# Patient Record
Sex: Female | Born: 1965 | Race: Black or African American | Hispanic: No | Marital: Married | Smoking: Former smoker
Health system: Southern US, Community
[De-identification: ages and names within clinical notes are randomized; demographics above are authoritative.]

## PROBLEM LIST (undated history)

## (undated) DIAGNOSIS — C801 Malignant (primary) neoplasm, unspecified: Secondary | ICD-10-CM

## (undated) DIAGNOSIS — M1990 Unspecified osteoarthritis, unspecified site: Secondary | ICD-10-CM

## (undated) HISTORY — PX: TONSILLECTOMY: SUR1361

## (undated) HISTORY — PX: CHOLECYSTECTOMY: SHX55

---

## 2009-03-02 ENCOUNTER — Ambulatory Visit: Payer: Self-pay | Admitting: Diagnostic Radiology

## 2009-03-02 ENCOUNTER — Emergency Department (HOSPITAL_BASED_OUTPATIENT_CLINIC_OR_DEPARTMENT_OTHER): Admission: EM | Admit: 2009-03-02 | Discharge: 2009-03-02 | Payer: Self-pay | Admitting: Emergency Medicine

## 2012-08-20 ENCOUNTER — Encounter (HOSPITAL_BASED_OUTPATIENT_CLINIC_OR_DEPARTMENT_OTHER): Payer: Self-pay | Admitting: *Deleted

## 2012-08-20 ENCOUNTER — Emergency Department (HOSPITAL_BASED_OUTPATIENT_CLINIC_OR_DEPARTMENT_OTHER)
Admission: EM | Admit: 2012-08-20 | Discharge: 2012-08-20 | Disposition: A | Discharge status: Home / Self Care | Payer: Self-pay | Attending: Emergency Medicine | Admitting: Emergency Medicine

## 2012-08-20 DIAGNOSIS — J3489 Other specified disorders of nose and nasal sinuses: Secondary | ICD-10-CM | POA: Insufficient documentation

## 2012-08-20 DIAGNOSIS — R05 Cough: Secondary | ICD-10-CM | POA: Insufficient documentation

## 2012-08-20 DIAGNOSIS — N390 Urinary tract infection, site not specified: Secondary | ICD-10-CM

## 2012-08-20 DIAGNOSIS — H9209 Otalgia, unspecified ear: Secondary | ICD-10-CM | POA: Insufficient documentation

## 2012-08-20 DIAGNOSIS — J069 Acute upper respiratory infection, unspecified: Secondary | ICD-10-CM | POA: Insufficient documentation

## 2012-08-20 DIAGNOSIS — Z3202 Encounter for pregnancy test, result negative: Secondary | ICD-10-CM | POA: Insufficient documentation

## 2012-08-20 DIAGNOSIS — IMO0001 Reserved for inherently not codable concepts without codable children: Secondary | ICD-10-CM | POA: Insufficient documentation

## 2012-08-20 DIAGNOSIS — R059 Cough, unspecified: Secondary | ICD-10-CM

## 2012-08-20 LAB — URINALYSIS, ROUTINE W REFLEX MICROSCOPIC
Bilirubin Urine: NEGATIVE
Nitrite: POSITIVE — AB
Specific Gravity, Urine: 1.018 (ref 1.005–1.030)
Urobilinogen, UA: 1 mg/dL (ref 0.0–1.0)

## 2012-08-20 LAB — PREGNANCY, URINE: Preg Test, Ur: NEGATIVE

## 2012-08-20 LAB — URINE MICROSCOPIC-ADD ON

## 2012-08-20 MED ORDER — HYDROCOD POLST-CHLORPHEN POLST 10-8 MG/5ML PO LQCR: 5.0000 mL | Freq: Every evening | ORAL | Status: DC | PRN

## 2012-08-20 MED ORDER — SULFAMETHOXAZOLE-TRIMETHOPRIM 800-160 MG PO TABS: 1.0000 | ORAL_TABLET | Freq: Two times a day (BID) | ORAL | Status: DC

## 2012-08-20 NOTE — ED Provider Notes (Signed)
Medical screening examination/treatment/procedure(s) were performed by non-physician practitioner and as supervising physician I was immediately available for consultation/collaboration.   Rayma Hegg, MD 08/20/12 1442 

## 2012-08-20 NOTE — ED Notes (Signed)
NP at bedside.

## 2012-08-20 NOTE — ED Notes (Signed)
Headache, sore throat, cough and body aches x 2 days.

## 2012-08-20 NOTE — ED Provider Notes (Signed)
History     CSN: [id number]  Arrival date & time 08/20/12  1339   First MD Initiated Contact with Patient 08/20/12 1349      Chief Complaint  Patient presents with  . URI    (Consider location/radiation/quality/duration/timing/severity/associated sxs/prior treatment) Patient is a 47 y.o. female presenting with URI. The history is provided by the patient. No language interpreter was used.  URI Presenting symptoms: congestion, cough and ear pain   Presenting symptoms: no fever   Severity:  Mild Onset quality:  Gradual Duration:  2 days Timing:  Constant Progression:  Unchanged Relieved by:  Nothing Worsened by:  Nothing tried Ineffective treatments:  None tried Associated symptoms: myalgias   Associated symptoms: no neck pain and no sinus pain   Risk factors: sick contacts     History reviewed. No pertinent past medical history.  Past Surgical History  Procedure Laterality Date  . Cholecystectomy    . Tonsillectomy      No family history on file.  History  Substance Use Topics  . Smoking status: Never Smoker   . Smokeless tobacco: Not on file  . Alcohol Use: Yes    OB History   Grav Para Term Preterm Abortions TAB SAB Ect Mult Living                  Review of Systems  Constitutional: Negative for fever.  HENT: Positive for ear pain and congestion. Negative for neck pain.   Respiratory: Positive for cough.   Musculoskeletal: Positive for myalgias.    Allergies  Review of patient's allergies indicates no known allergies.  Home Medications  No current outpatient prescriptions on file.  BP 115/73  Pulse 95  Temp(Src) 99.1 F (37.3 C) (Oral)  Resp 22  Wt 275 lb (124.739 kg)  SpO2 97%  LMP 04/20/2012  Physical Exam  Nursing note and vitals reviewed. Constitutional: She is oriented to person, place, and time. She appears well-developed and well-nourished.  HENT:  Head: Normocephalic and atraumatic.  Right Ear: External ear normal.  Left Ear:  External ear normal.  Nose: Rhinorrhea present.  Mouth/Throat: Posterior oropharyngeal erythema present.  Eyes: Conjunctivae and EOM are normal.  Neck: Normal range of motion.  Cardiovascular: Normal rate and regular rhythm.   Pulmonary/Chest: Effort normal and breath sounds normal.  Musculoskeletal: Normal range of motion.  Neurological: She is alert and oriented to person, place, and time.  Skin: Skin is warm and dry.  Psychiatric: She has a normal mood and affect.    ED Course  Procedures (including critical care time)  Labs Reviewed  URINALYSIS, ROUTINE W REFLEX MICROSCOPIC - Abnormal; Notable for the following:    APPearance CLOUDY (*)    Hgb urine dipstick TRACE (*)    Ketones, ur 15 (*)    Nitrite POSITIVE (*)    Leukocytes, UA MODERATE (*)    All other components within normal limits  URINE MICROSCOPIC-ADD ON - Abnormal; Notable for the following:    Bacteria, UA MANY (*)    All other components within normal limits  URINE CULTURE  PREGNANCY, URINE   No results found.   1. UTI (lower urinary tract infection)   2. Cough       MDM  uti treated:will the uri symptoms with cough syrup:urine sent for culture        Teressa Lower, NP 08/20/12 1425

## 2012-08-22 LAB — URINE CULTURE: Colony Count: >=100000

## 2012-08-23 NOTE — ED Notes (Signed)
+   Urine Patient treated with Septra-sensitive to same-chart appended per protocol MD. 

## 2013-08-15 ENCOUNTER — Encounter (HOSPITAL_BASED_OUTPATIENT_CLINIC_OR_DEPARTMENT_OTHER): Payer: Self-pay | Admitting: Emergency Medicine

## 2013-08-15 ENCOUNTER — Emergency Department (HOSPITAL_BASED_OUTPATIENT_CLINIC_OR_DEPARTMENT_OTHER): Payer: No Typology Code available for payment source

## 2013-08-15 ENCOUNTER — Emergency Department (HOSPITAL_BASED_OUTPATIENT_CLINIC_OR_DEPARTMENT_OTHER)
Admission: EM | Admit: 2013-08-15 | Discharge: 2013-08-15 | Disposition: A | Discharge status: Home / Self Care | Payer: No Typology Code available for payment source | Attending: Emergency Medicine | Admitting: Emergency Medicine

## 2013-08-15 DIAGNOSIS — S139XXA Sprain of joints and ligaments of unspecified parts of neck, initial encounter: Secondary | ICD-10-CM | POA: Insufficient documentation

## 2013-08-15 DIAGNOSIS — Y92410 Unspecified street and highway as the place of occurrence of the external cause: Secondary | ICD-10-CM | POA: Insufficient documentation

## 2013-08-15 DIAGNOSIS — S161XXA Strain of muscle, fascia and tendon at neck level, initial encounter: Secondary | ICD-10-CM

## 2013-08-15 DIAGNOSIS — S0990XA Unspecified injury of head, initial encounter: Secondary | ICD-10-CM | POA: Insufficient documentation

## 2013-08-15 DIAGNOSIS — S298XXA Other specified injuries of thorax, initial encounter: Secondary | ICD-10-CM | POA: Insufficient documentation

## 2013-08-15 DIAGNOSIS — R0789 Other chest pain: Secondary | ICD-10-CM

## 2013-08-15 DIAGNOSIS — S46909A Unspecified injury of unspecified muscle, fascia and tendon at shoulder and upper arm level, unspecified arm, initial encounter: Secondary | ICD-10-CM | POA: Insufficient documentation

## 2013-08-15 DIAGNOSIS — S4980XA Other specified injuries of shoulder and upper arm, unspecified arm, initial encounter: Secondary | ICD-10-CM | POA: Insufficient documentation

## 2013-08-15 DIAGNOSIS — Y9389 Activity, other specified: Secondary | ICD-10-CM | POA: Insufficient documentation

## 2013-08-15 DIAGNOSIS — IMO0002 Reserved for concepts with insufficient information to code with codable children: Secondary | ICD-10-CM | POA: Insufficient documentation

## 2013-08-15 DIAGNOSIS — Z792 Long term (current) use of antibiotics: Secondary | ICD-10-CM | POA: Insufficient documentation

## 2013-08-15 DIAGNOSIS — Z87891 Personal history of nicotine dependence: Secondary | ICD-10-CM | POA: Insufficient documentation

## 2013-08-15 MED ORDER — HYDROCODONE-ACETAMINOPHEN 5-325 MG PO TABS: 1.0000 | ORAL_TABLET | Freq: Four times a day (QID) | ORAL | Status: DC | PRN

## 2013-08-15 MED ORDER — CYCLOBENZAPRINE HCL 10 MG PO TABS: 10.0000 mg | ORAL_TABLET | Freq: Two times a day (BID) | ORAL | Status: DC | PRN
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
## 2013-08-15 NOTE — ED Notes (Signed)
MD at bedside. 

## 2020-10-10 ENCOUNTER — Inpatient Hospital Stay (HOSPITAL_BASED_OUTPATIENT_CLINIC_OR_DEPARTMENT_OTHER)
Admission: EM | Admit: 2020-10-10 | Discharge: 2020-10-18 | DRG: 330 | Disposition: A | Discharge status: Home / Self Care | Payer: 59 | Attending: Internal Medicine | Admitting: Internal Medicine

## 2020-10-10 ENCOUNTER — Encounter (HOSPITAL_BASED_OUTPATIENT_CLINIC_OR_DEPARTMENT_OTHER): Payer: Self-pay

## 2020-10-10 ENCOUNTER — Other Ambulatory Visit: Payer: Self-pay

## 2020-10-10 ENCOUNTER — Emergency Department (HOSPITAL_BASED_OUTPATIENT_CLINIC_OR_DEPARTMENT_OTHER): Payer: 59

## 2020-10-10 DIAGNOSIS — C787 Secondary malignant neoplasm of liver and intrahepatic bile duct: Secondary | ICD-10-CM | POA: Diagnosis present

## 2020-10-10 DIAGNOSIS — C180 Malignant neoplasm of cecum: Secondary | ICD-10-CM | POA: Diagnosis present

## 2020-10-10 DIAGNOSIS — Z6841 Body Mass Index (BMI) 40.0 and over, adult: Secondary | ICD-10-CM | POA: Diagnosis not present

## 2020-10-10 DIAGNOSIS — Z9049 Acquired absence of other specified parts of digestive tract: Secondary | ICD-10-CM

## 2020-10-10 DIAGNOSIS — Z90710 Acquired absence of both cervix and uterus: Secondary | ICD-10-CM | POA: Diagnosis not present

## 2020-10-10 DIAGNOSIS — N390 Urinary tract infection, site not specified: Secondary | ICD-10-CM | POA: Diagnosis present

## 2020-10-10 DIAGNOSIS — D123 Benign neoplasm of transverse colon: Secondary | ICD-10-CM | POA: Diagnosis not present

## 2020-10-10 DIAGNOSIS — E860 Dehydration: Secondary | ICD-10-CM | POA: Diagnosis present

## 2020-10-10 DIAGNOSIS — Z87891 Personal history of nicotine dependence: Secondary | ICD-10-CM | POA: Diagnosis not present

## 2020-10-10 DIAGNOSIS — R160 Hepatomegaly, not elsewhere classified: Secondary | ICD-10-CM | POA: Diagnosis not present

## 2020-10-10 DIAGNOSIS — E871 Hypo-osmolality and hyponatremia: Secondary | ICD-10-CM | POA: Diagnosis present

## 2020-10-10 DIAGNOSIS — K56609 Unspecified intestinal obstruction, unspecified as to partial versus complete obstruction: Secondary | ICD-10-CM

## 2020-10-10 DIAGNOSIS — E876 Hypokalemia: Secondary | ICD-10-CM | POA: Diagnosis present

## 2020-10-10 DIAGNOSIS — R112 Nausea with vomiting, unspecified: Secondary | ICD-10-CM | POA: Diagnosis not present

## 2020-10-10 DIAGNOSIS — Z20822 Contact with and (suspected) exposure to covid-19: Secondary | ICD-10-CM | POA: Diagnosis present

## 2020-10-10 LAB — URINALYSIS, ROUTINE W REFLEX MICROSCOPIC
Bilirubin Urine: NEGATIVE
Glucose, UA: NEGATIVE mg/dL
Ketones, ur: 40 mg/dL — AB
Leukocytes,Ua: NEGATIVE
Nitrite: POSITIVE — AB
Protein, ur: NEGATIVE mg/dL
Specific Gravity, Urine: <1.005 — ABNORMAL LOW (ref 1.005–1.030)
pH: 5 (ref 5.0–8.0)

## 2020-10-10 LAB — CBC WITH DIFFERENTIAL/PLATELET
Abs Immature Granulocytes: 0.01 10*3/uL (ref 0.00–0.07)
Basophils Absolute: 0 10*3/uL (ref 0.0–0.1)
Basophils Relative: 1 %
Eosinophils Absolute: 0.1 10*3/uL (ref 0.0–0.5)
Eosinophils Relative: 3 %
HCT: 44.2 % (ref 36.0–46.0)
Hemoglobin: 14.2 g/dL (ref 12.0–15.0)
Immature Granulocytes: 0 %
Lymphocytes Relative: 42 %
Lymphs Abs: 2.3 10*3/uL (ref 0.7–4.0)
MCH: 30.5 pg (ref 26.0–34.0)
MCHC: 32.1 g/dL (ref 30.0–36.0)
MCV: 95.1 fL (ref 80.0–100.0)
Monocytes Absolute: 0.4 10*3/uL (ref 0.1–1.0)
Monocytes Relative: 8 %
Neutro Abs: 2.5 10*3/uL (ref 1.7–7.7)
Neutrophils Relative %: 46 %
Platelets: 342 10*3/uL (ref 150–400)
RBC: 4.65 MIL/uL (ref 3.87–5.11)
RDW: 15.3 % (ref 11.5–15.5)
WBC: 5.4 10*3/uL (ref 4.0–10.5)
nRBC: 0 % (ref 0.0–0.2)

## 2020-10-10 LAB — COMPREHENSIVE METABOLIC PANEL
ALT: 66 U/L — ABNORMAL HIGH (ref 0–44)
AST: 61 U/L — ABNORMAL HIGH (ref 15–41)
Albumin: 3.5 g/dL (ref 3.5–5.0)
Alkaline Phosphatase: 72 U/L (ref 38–126)
Anion gap: 11 (ref 5–15)
BUN: 12 mg/dL (ref 6–20)
CO2: 25 mmol/L (ref 22–32)
Calcium: 8.9 mg/dL (ref 8.9–10.3)
Chloride: 98 mmol/L (ref 98–111)
Creatinine, Ser: 0.89 mg/dL (ref 0.44–1.00)
GFR, Estimated: >60 mL/min (ref >60)
Glucose, Bld: 95 mg/dL (ref 70–99)
Potassium: 3.6 mmol/L (ref 3.5–5.1)
Sodium: 134 mmol/L — ABNORMAL LOW (ref 135–145)
Total Bilirubin: 0.8 mg/dL (ref 0.3–1.2)
Total Protein: 7.4 g/dL (ref 6.5–8.1)

## 2020-10-10 LAB — URINALYSIS, MICROSCOPIC (REFLEX)

## 2020-10-10 LAB — LIPASE, BLOOD: Lipase: 23 U/L (ref 11–51)

## 2020-10-10 LAB — RESP PANEL BY RT-PCR (FLU A&B, COVID) ARPGX2
Influenza A by PCR: NEGATIVE
Influenza B by PCR: NEGATIVE
SARS Coronavirus 2 by RT PCR: NEGATIVE

## 2020-10-10 MED ORDER — SODIUM CHLORIDE 0.9 % IV BOLUS
1000.0000 mL | Freq: Once | INTRAVENOUS | Status: AC
  Administered 2020-10-10: 1000 mL via INTRAVENOUS

## 2020-10-10 MED ORDER — ONDANSETRON HCL 4 MG/2ML IJ SOLN
4.0000 mg | Freq: Four times a day (QID) | INTRAMUSCULAR | Status: DC | PRN
  Administered 2020-10-15: 4 mg via INTRAVENOUS
  Filled 2020-10-10: qty 2

## 2020-10-10 MED ORDER — ENOXAPARIN SODIUM 60 MG/0.6ML IJ SOSY
60.0000 mg | PREFILLED_SYRINGE | INTRAMUSCULAR | Status: DC
  Administered 2020-10-10 – 2020-10-14 (×4): 60 mg via SUBCUTANEOUS
  Filled 2020-10-10 (×4): qty 0.6

## 2020-10-10 MED ORDER — IOHEXOL 300 MG/ML  SOLN
100.0000 mL | Freq: Once | INTRAMUSCULAR | Status: AC | PRN
  Administered 2020-10-10: 100 mL via INTRAVENOUS

## 2020-10-10 MED ORDER — ONDANSETRON HCL 4 MG PO TABS: 4.0000 mg | ORAL_TABLET | Freq: Four times a day (QID) | ORAL | Status: DC | PRN

## 2020-10-10 MED ORDER — MORPHINE SULFATE (PF) 2 MG/ML IV SOLN
2.0000 mg | INTRAVENOUS | Status: DC | PRN
Start: 2020-10-10 — End: 2020-10-18

## 2020-10-10 MED ORDER — DEXTROSE IN LACTATED RINGERS 5 % IV SOLN: INTRAVENOUS | Status: DC

## 2020-10-10 NOTE — H&P (Signed)
History and Physical   PRABHJOT MADDUX GQQ:[id number] DOB: Aug 06, 1965 DOA: 10/10/2020  Referring MD/NP/PA: Serita Grit, PA  PCP: Pcp, No   Outpatient Specialists: None  Patient coming from: Home via [place]  Chief Complaint: Nausea vomiting  HPI: Lauren Mcpherson is a 55 y.o. female with medical history significant of recent cholecystectomy, morbid obesity who presented to [place] with abdominal pain nausea vomiting.  Symptoms started early this morning.  She went to urgent care center where she was seen and evaluated.  Initial evaluation suspected small bowel obstruction so she was sent to the ER for further evaluation.  In the ER evaluation also confirmed small bowel obstruction with transition point around the jejunum.  She also has some abnormal findings in the liver suspicious for masses.  Patient has been sick for about a week.  Initially started with a diarrhea after eating coleslaw.  Since then he was gradually worsening.  It got better but then restarted again.  No hematemesis no melena no bright red blood per rectum.  In the ER she was seen and evaluated.  Patient denies ongoing nausea or vomiting at this point.  She is minimally distended.  She denied NG tube insertion and she has been left alone since she is not throwing up and does not appear to be markedly distended.  Surgery consulted Dr. Brantley Stage from [place].  Recommendation is to admit the patient for management of this small bowel obstruction.  ED Course: Vitals and telemetry stable.  Sodium 134 otherwise chemistries largely within normal.  AST 61 and ALT 66.  CBC completely within normal.  COVID-19 so far negative.  Urinalysis showed WBC 6-10 nitrite positive and many bacteria.  CT abdomen pelvis shows several ill-defined hypoattenuated masses throughout the liver.  These are indeterminate.  There is metastatic disease as part of differential.  Patient also has small bowel obstruction to the level  of the terminal ileum.  The terminal ileum is thickened.  Soft tissue mass cannot be excluded.  She has scattered colonic diverticulosis without evidence of acute diverticulitis.  MRI recommended.  Review of Systems: As per HPI otherwise 10 point review of systems negative.    History reviewed. No pertinent past medical history.  Past Surgical History:  Procedure Laterality Date   CHOLECYSTECTOMY     TONSILLECTOMY       reports that she has quit smoking. She does not have any smokeless tobacco history on file. She reports current alcohol use. She reports that she does not use drugs.  No Known Allergies  History reviewed. No pertinent family history.   Prior to Admission medications   Medication Sig Start Date End Date Taking? Authorizing Provider  chlorpheniramine-HYDROcodone (TUSSIONEX PENNKINETIC ER) 10-8 MG/5ML LQCR Take 5 mLs by mouth at bedtime as needed. 08/20/12   Glendell Docker, NP  cyclobenzaprine (FLEXERIL) 10 MG tablet Take 1 tablet (10 mg total) by mouth 2 (two) times daily as needed for muscle spasms. 08/15/13   Fredia Sorrow, MD  HYDROcodone-acetaminophen (NORCO/VICODIN) 5-325 MG per tablet Take 1-2 tablets by mouth every 6 (six) hours as needed for moderate pain. 08/15/13   Fredia Sorrow, MD  sulfamethoxazole-trimethoprim (SEPTRA DS) 800-160 MG per tablet Take 1 tablet by mouth every 12 (twelve) hours. 08/20/12   Glendell Docker, NP    Physical Exam: Vitals:   10/10/20 1600 10/10/20 1710 10/10/20 1830 10/10/20 2045  BP: 136/60 127/76 126/73 138/76  Pulse: 62 86 72 64  Resp:  18 18 18 18   Temp:    97.9 F (36.6 C)  TempSrc:    Oral  SpO2: 100% 100% 100% 99%  Weight:      Height:          Constitutional: Obese, no distress Vitals:   10/10/20 1600 10/10/20 1710 10/10/20 1830 10/10/20 2045  BP: 136/60 127/76 126/73 138/76  Pulse: 62 86 72 64  Resp: 18 18 18 18   Temp:    97.9 F (36.6 C)  TempSrc:    Oral  SpO2: 100% 100% 100% 99%  Weight:       Height:       Eyes: PERRL, lids and conjunctivae normal ENMT: Mucous membranes are dry. Posterior pharynx clear of any exudate or lesions.Normal dentition.  Neck: normal, supple, no masses, no thyromegaly Respiratory: clear to auscultation bilaterally, no wheezing, no crackles. Normal respiratory effort. No accessory muscle use.  Cardiovascular: Regular rate and rhythm, no murmurs / rubs / gallops. No extremity edema. 2+ pedal pulses. No carotid bruits.  Abdomen: no tenderness, no masses palpated. No hepatosplenomegaly. Bowel sounds positive.  Musculoskeletal: no clubbing / cyanosis. No joint deformity upper and lower extremities. Good ROM, no contractures. Normal muscle tone.  Skin: no rashes, lesions, ulcers. No induration Neurologic: CN 2-12 grossly intact. Sensation intact, DTR normal. Strength 5/5 in all 4.  Psychiatric: Normal judgment and insight. Alert and oriented x 3. Normal mood.     Labs on Admission: I have personally reviewed following labs and imaging studies  CBC: Recent Labs  Lab 10/10/20 1511  WBC 5.4  NEUTROABS 2.5  HGB 14.2  HCT 44.2  MCV 95.1  PLT 917   Basic Metabolic Panel: Recent Labs  Lab 10/10/20 1511  NA 134*  K 3.6  CL 98  CO2 25  GLUCOSE 95  BUN 12  CREATININE 0.89  CALCIUM 8.9   GFR: Estimated Creatinine Clearance: 94.9 mL/min (by C-G formula based on SCr of 0.89 mg/dL). Liver Function Tests: Recent Labs  Lab 10/10/20 1511  AST 61*  ALT 66*  ALKPHOS 72  BILITOT 0.8  PROT 7.4  ALBUMIN 3.5   Recent Labs  Lab 10/10/20 1511  LIPASE 23   No results for input(s): AMMONIA in the last 168 hours. Coagulation Profile: No results for input(s): INR, PROTIME in the last 168 hours. Cardiac Enzymes: No results for input(s): CKTOTAL, CKMB, CKMBINDEX, TROPONINI in the last 168 hours. BNP (last 3 results) No results for input(s): PROBNP in the last 8760 hours. HbA1C: No results for input(s): HGBA1C in the last 72 hours. CBG: No  results for input(s): GLUCAP in the last 168 hours. Lipid Profile: No results for input(s): CHOL, HDL, LDLCALC, TRIG, CHOLHDL, LDLDIRECT in the last 72 hours. Thyroid Function Tests: No results for input(s): TSH, T4TOTAL, FREET4, T3FREE, THYROIDAB in the last 72 hours. Anemia Panel: No results for input(s): VITAMINB12, FOLATE, FERRITIN, TIBC, IRON, RETICCTPCT in the last 72 hours. Urine analysis:    Component Value Date/Time   COLORURINE YELLOW 10/10/2020 1916   APPEARANCEUR CLEAR 10/10/2020 1916   LABSPEC <1.005 (L) 10/10/2020 1916   PHURINE 5.0 10/10/2020 1916   GLUCOSEU NEGATIVE 10/10/2020 1916   HGBUR SMALL (A) 10/10/2020 1916   BILIRUBINUR NEGATIVE 10/10/2020 1916   KETONESUR 40 (A) 10/10/2020 1916   PROTEINUR NEGATIVE 10/10/2020 1916   UROBILINOGEN 1.0 08/20/2012 1345   NITRITE POSITIVE (A) 10/10/2020 1916   LEUKOCYTESUR NEGATIVE 10/10/2020 1916   Sepsis Labs: @LABRCNTIP (procalcitonin:4,lacticidven:4) ) Recent Results (from the past 240 hour(s))  Resp Panel by RT-PCR (Flu A&B, Covid) Nasopharyngeal Swab     Status: None   Collection Time: 10/10/20  5:18 PM   Specimen: Nasopharyngeal Swab; Nasopharyngeal(NP) swabs in vial transport medium  Result Value Ref Range Status   SARS Coronavirus 2 by RT PCR NEGATIVE NEGATIVE Final    Comment: (NOTE) SARS-CoV-2 target nucleic acids are NOT DETECTED.  The SARS-CoV-2 RNA is generally detectable in upper respiratory specimens during the acute phase of infection. The lowest concentration of SARS-CoV-2 viral copies this assay can detect is 138 copies/mL. A negative result does not preclude SARS-Cov-2 infection and should not be used as the sole basis for treatment or other patient management decisions. A negative result may occur with  improper specimen collection/handling, submission of specimen other than nasopharyngeal swab, presence of viral mutation(s) within the areas targeted by this assay, and inadequate number of  viral copies(<138 copies/mL). A negative result must be combined with clinical observations, patient history, and epidemiological information. The expected result is Negative.  Fact Sheet for Patients:  EntrepreneurPulse.com.au  Fact Sheet for Healthcare Providers:  IncredibleEmployment.be  This test is no t yet approved or cleared by the [place] FDA and  has been authorized for detection and/or diagnosis of SARS-CoV-2 by FDA under an Emergency Use Authorization (EUA). This EUA will remain  in effect (meaning this test can be used) for the duration of the COVID-19 declaration under Section 564(b)(1) of the Act, 21 U.S.C.section 360bbb-3(b)(1), unless the authorization is terminated  or revoked sooner.       Influenza A by PCR NEGATIVE NEGATIVE Final   Influenza B by PCR NEGATIVE NEGATIVE Final    Comment: (NOTE) The Xpert Xpress SARS-CoV-2/FLU/RSV plus assay is intended as an aid in the diagnosis of influenza from Nasopharyngeal swab specimens and should not be used as a sole basis for treatment. Nasal washings and aspirates are unacceptable for Xpert Xpress SARS-CoV-2/FLU/RSV testing.  Fact Sheet for Patients: EntrepreneurPulse.com.au  Fact Sheet for Healthcare Providers: IncredibleEmployment.be  This test is not yet approved or cleared by the [place] FDA and has been authorized for detection and/or diagnosis of SARS-CoV-2 by FDA under an Emergency Use Authorization (EUA). This EUA will remain in effect (meaning this test can be used) for the duration of the COVID-19 declaration under Section 564(b)(1) of the Act, 21 U.S.C. section 360bbb-3(b)(1), unless the authorization is terminated or revoked.  Performed at [facility], [address] [place], [place] [address]      Radiological Exams on Admission: CT Abdomen Pelvis W Contrast  Result Date: 10/10/2020 CLINICAL DATA:   Suspected bowel obstruction. EXAM: CT ABDOMEN AND PELVIS WITH CONTRAST TECHNIQUE: Multidetector CT imaging of the abdomen and pelvis was performed using the standard protocol following bolus administration of intravenous contrast. CONTRAST:  144mL OMNIPAQUE IOHEXOL 300 MG/ML  SOLN COMPARISON:  None. FINDINGS: Lower chest: No acute abnormality. Hepatobiliary: Several ill-defined hypoattenuated masses throughout the liver. Status post cholecystectomy. No biliary dilatation. Pancreas: Unremarkable. No pancreatic ductal dilatation or surrounding inflammatory changes. Spleen: Normal in size without focal abnormality. Adrenals/Urinary Tract: Adrenal glands are unremarkable. Kidneys are without renal calculi, focal lesion, or hydronephrosis. Few mm probable right renal cyst. Bladder is unremarkable. Stomach/Bowel: 1.5 cm gastric diverticulum off of the posterior gastric body. There is diffuse distal small bowel dilation to the level of the terminal ileum. The appendix is normal. The colon is decompressed. Nonspecific thickening of the terminal ileum. Scattered colonic diverticulosis. Vascular/Lymphatic: No significant vascular findings are present.  No enlarged abdominal or pelvic lymph nodes. Multiple shotty nonspecific central mesenteric lymph nodes. Reproductive: No acute findings. Other: No abdominal wall hernia or abnormality. No abdominopelvic ascites. Musculoskeletal: Bilateral L5-S1 pars articularis defects. No alignment abnormalities. Spondylosis of the lower lumbosacral spine. IMPRESSION: 1. Several ill-defined hypoattenuated masses throughout the liver, indeterminate. Metastatic disease is in the differential diagnosis. Further evaluation with contrast-enhanced abdominal MRI may be considered. 2. Small bowel obstruction to the level of the terminal ileum. Thickening of the terminal ileum, soft tissue mass cannot be excluded. 3. Scattered colonic diverticulosis without evidence of acute diverticulitis. 4.  Bilateral L5-S1 pars articularis defects. No alignment abnormalities. Electronically Signed   By: Fidela Salisbury M.D.   On: 10/10/2020 16:53      Assessment/Plan Principal Problem:   SBO (small bowel obstruction) (HCC) Hyponatremia UTI   #1 small bowel obstruction: Per CT.  This could be related to previous surgery but could also be as a result of intra-abdominal mass.  Worsened especially with what appears to be liver mets.  Patient will be admitted.  Currently not sick not nauseated and not throwing up.  We will not insert the NG tube for now but if symptoms change she understands she needs to get NG tube.  Also keep her n.p.o.  Fluid resuscitation.  Follow surgical consultation.  #2 hyponatremia: Mild dehydration.  Aggressively hydrate.  #3 UTI: Largely asymptomatic.  I will add Rocephin empirically  #4 morbid obesity: Counseling.  #5 suspected intra-abdominal mass with liver mets: I will order MRI with contrast of the abdomen and pelvis.  Follow results.   DVT prophylaxis: Lovenox Code Status: Full code Family Communication: No family at bedside Disposition Plan: Home Consults called: Dr. Brantley Stage, general surgery Admission status: Inpatient  Severity of Illness: The appropriate patient status for this patient is INPATIENT. Inpatient status is judged to be reasonable and necessary in order to provide the required intensity of service to ensure the patient's safety. The patient's presenting symptoms, physical exam findings, and initial radiographic and laboratory data in the context of their chronic comorbidities is felt to place them at high risk for further clinical deterioration. Furthermore, it is not anticipated that the patient will be medically stable for discharge from the hospital within 2 midnights of admission. The following factors support the patient status of inpatient.   " The patient's presenting symptoms include abdominal pain nausea vomiting. " The worrisome  physical exam findings include obese but otherwise no significant findings. " The initial radiographic and laboratory data are worrisome because of findings of possible intra-abdominal mass. " The chronic co-morbidities include none.   * I certify that at the point of admission it is my clinical judgment that the patient will require inpatient hospital care spanning beyond 2 midnights from the point of admission due to high intensity of service, high risk for further deterioration and high frequency of surveillance required.Barbette Merino MD [facility] Pager [phone]  If 7PM-7AM, please contact night-coverage [email or web address] Password [facility]  10/10/2020, 9:21 PM

## 2020-10-10 NOTE — ED Provider Notes (Signed)
[facility] EMERGENCY DEPARTMENT Provider Note   CSN: [id number] Arrival date & time: 10/10/20  1414     History Chief Complaint  Patient presents with   Emesis    Lauren Mcpherson is a 55 y.o. female with pertinent past medical history of cholecystectomy and insert me that presents emergency department today for abdominal pain.  Patient was sent here by urgent care after going there this morning to rule out SBO.  Patient states that her symptoms started a week and a half ago when she started having diarrhea after eating coleslaw at a family event.  Patient states that diarrhea persisted for couple days and then she took a laxative and her symptoms of diarrhea and abdominal pain resolved.  States that abdominal pain was generalized and pelvic cramping sensation, worse after she ate.  Over the next 3 to 4 days she felt much better, symptoms had completely resolved.  Was having normal bowel movements at that time, this was on Wednesday.  States that then yesterday her abdominal pain reappeared with some vomiting this time.  Did have a normal bowel movement last night.  Did not have a bowel movement this morning and did not vomit this morning.  Patient states that abdominal pain and nausea and vomiting have resolved while being here without any medications.  Denies any dysuria or hematuria.Denies anyireflux symptoms.  States that pain does get worse after eating or drinking, states that she feels a" gurgly" sensation after she eats and the pain is generalized.  Does not radiate anywhere.  No chest pain or shortness of breath.  No other complaints at this time.  Does have history of cholecystectomy and hysterectomy..  Denies any fevers or diarrhea.  No recent medication changes, no antibiotics.  HPI     History reviewed. No pertinent past medical history.  Patient Active Problem List   Diagnosis Date Noted   SBO (small bowel obstruction) ([place]) 10/10/2020    Past Surgical History:   Procedure Laterality Date   CHOLECYSTECTOMY     TONSILLECTOMY       OB History   No obstetric history on file.     History reviewed. No pertinent family history.  Social History   Tobacco Use   Smoking status: Former    Pack years: 0.00  Substance Use Topics   Alcohol use: Yes    Comment: occ   Drug use: No    Home Medications Prior to Admission medications   Medication Sig Start Date End Date Taking? Authorizing Provider  chlorpheniramine-HYDROcodone (TUSSIONEX PENNKINETIC ER) 10-8 MG/5ML LQCR Take 5 mLs by mouth at bedtime as needed. 08/20/12   Glendell Docker, NP  cyclobenzaprine (FLEXERIL) 10 MG tablet Take 1 tablet (10 mg total) by mouth 2 (two) times daily as needed for muscle spasms. 08/15/13   Fredia Sorrow, MD  HYDROcodone-acetaminophen (NORCO/VICODIN) 5-325 MG per tablet Take 1-2 tablets by mouth every 6 (six) hours as needed for moderate pain. 08/15/13   Fredia Sorrow, MD  sulfamethoxazole-trimethoprim (SEPTRA DS) 800-160 MG per tablet Take 1 tablet by mouth every 12 (twelve) hours. 08/20/12   Glendell Docker, NP    Allergies    Patient has no known allergies.  Review of Systems   Review of Systems  Constitutional:  Negative for chills, diaphoresis, fatigue and fever.  HENT:  Negative for congestion, sore throat and trouble swallowing.   Eyes:  Negative for pain and visual disturbance.  Respiratory:  Negative for cough, shortness of breath and wheezing.  Cardiovascular:  Negative for chest pain, palpitations and leg swelling.  Gastrointestinal:  Positive for abdominal pain, nausea and vomiting. Negative for abdominal distention and diarrhea.  Genitourinary:  Negative for difficulty urinating.  Musculoskeletal:  Negative for back pain, neck pain and neck stiffness.  Skin:  Negative for pallor.  Neurological:  Negative for dizziness, speech difficulty, weakness and headaches.  Psychiatric/Behavioral:  Negative for confusion.    Physical Exam Updated  Vital Signs BP 127/76 (BP Location: Right Arm)   Pulse 86   Temp 97.7 F (36.5 C) (Oral)   Resp 18   Ht 5\' 3"  (1.6 m)   Wt 129.3 kg   SpO2 100%   BMI 50.49 kg/m   Physical Exam Constitutional:      General: She is not in acute distress.    Appearance: Normal appearance. She is not ill-appearing, toxic-appearing or diaphoretic.  HENT:     Mouth/Throat:     Mouth: Mucous membranes are moist.     Pharynx: Oropharynx is clear.  Eyes:     General: No scleral icterus.    Extraocular Movements: Extraocular movements intact.     Pupils: Pupils are equal, round, and reactive to light.  Cardiovascular:     Rate and Rhythm: Normal rate and regular rhythm.     Pulses: Normal pulses.     Heart sounds: Normal heart sounds.  Pulmonary:     Effort: Pulmonary effort is normal. No respiratory distress.     Breath sounds: Normal breath sounds. No stridor. No wheezing, rhonchi or rales.  Chest:     Chest wall: No tenderness.  Abdominal:     General: Abdomen is flat. There is no distension.     Palpations: Abdomen is soft.     Tenderness: There is no abdominal tenderness. There is no guarding or rebound.  Musculoskeletal:        General: No swelling or tenderness. Normal range of motion.     Cervical back: Normal range of motion and neck supple. No rigidity.     Right lower leg: No edema.     Left lower leg: No edema.  Skin:    General: Skin is warm and dry.     Capillary Refill: Capillary refill takes less than 2 seconds.     Coloration: Skin is not pale.  Neurological:     General: No focal deficit present.     Mental Status: She is alert and oriented to person, place, and time.  Psychiatric:        Mood and Affect: Mood normal.        Behavior: Behavior normal.    ED Results / Procedures / Treatments   Labs (all labs ordered are listed, but only abnormal results are displayed) Labs Reviewed  COMPREHENSIVE METABOLIC PANEL - Abnormal; Notable for the following components:       Result Value   Sodium 134 (*)    AST 61 (*)    ALT 66 (*)    All other components within normal limits  RESP PANEL BY RT-PCR (FLU A&B, COVID) [id number]  CBC WITH DIFFERENTIAL/PLATELET  LIPASE, BLOOD  URINALYSIS, ROUTINE W REFLEX MICROSCOPIC    EKG None  Radiology CT Abdomen Pelvis W Contrast  Result Date: 10/10/2020 CLINICAL DATA:  Suspected bowel obstruction. EXAM: CT ABDOMEN AND PELVIS WITH CONTRAST TECHNIQUE: Multidetector CT imaging of the abdomen and pelvis was performed using the standard protocol following bolus administration of intravenous contrast. CONTRAST:  1107mL OMNIPAQUE IOHEXOL 300 MG/ML  SOLN COMPARISON:  None. FINDINGS: Lower chest: No acute abnormality. Hepatobiliary: Several ill-defined hypoattenuated masses throughout the liver. Status post cholecystectomy. No biliary dilatation. Pancreas: Unremarkable. No pancreatic ductal dilatation or surrounding inflammatory changes. Spleen: Normal in size without focal abnormality. Adrenals/Urinary Tract: Adrenal glands are unremarkable. Kidneys are without renal calculi, focal lesion, or hydronephrosis. Few mm probable right renal cyst. Bladder is unremarkable. Stomach/Bowel: 1.5 cm gastric diverticulum off of the posterior gastric body. There is diffuse distal small bowel dilation to the level of the terminal ileum. The appendix is normal. The colon is decompressed. Nonspecific thickening of the terminal ileum. Scattered colonic diverticulosis. Vascular/Lymphatic: No significant vascular findings are present. No enlarged abdominal or pelvic lymph nodes. Multiple shotty nonspecific central mesenteric lymph nodes. Reproductive: No acute findings. Other: No abdominal wall hernia or abnormality. No abdominopelvic ascites. Musculoskeletal: Bilateral L5-S1 pars articularis defects. No alignment abnormalities. Spondylosis of the lower lumbosacral spine. IMPRESSION: 1. Several ill-defined hypoattenuated masses throughout the liver, indeterminate.  Metastatic disease is in the differential diagnosis. Further evaluation with contrast-enhanced abdominal MRI may be considered. 2. Small bowel obstruction to the level of the terminal ileum. Thickening of the terminal ileum, soft tissue mass cannot be excluded. 3. Scattered colonic diverticulosis without evidence of acute diverticulitis. 4. Bilateral L5-S1 pars articularis defects. No alignment abnormalities. Electronically Signed   By: Fidela Salisbury M.D.   On: 10/10/2020 16:53    Procedures Procedures   Medications Ordered in ED Medications  sodium chloride 0.9 % bolus 1,000 mL (0 mLs Intravenous Stopped 10/10/20 1710)  iohexol (OMNIPAQUE) 300 MG/ML solution 100 mL (100 mLs Intravenous Contrast Given 10/10/20 1621)    ED Course  I have reviewed the triage vital signs and the nursing notes.  Pertinent labs & imaging results that were available during my care of the patient were reviewed by me and considered in my medical decision making (see chart for details).  Clinical Course as of 10/10/20 1747  Sun Oct 10, 2020  1642 CT Abdomen Pelvis W Contrast [SP]    Clinical Course User Index [SP] Alfredia Client, PA-C   MDM Rules/Calculators/A&P                         Patient presents to the emergency department today from urgent care to rule out SBO.  Patient states that she had episode of abdominal pain, vomiting last night.  Symptoms have mostly resolved, abdominal exam is benign, no surgical abdomen.  Patient appears very well, nontoxic-appearing with normal vital signs.  Will obtain basic labs, CT imaging.  Patient is not nauseous or having any pain currently, does not want any medications at this time.  CT imaging shows SBO to the level of the terminal ileum.  Most likely from soft tissue mass, unfortunately also did show multiple lesions in the liver with leading differential to be metastatic cancer causing SBO.  Spoke to general surgery, Dr. Brantley Stage who will consult.  No need for NG  tube at this time, patient is not vomiting or does not feel nauseous.  Patient still does not want any pain medications upon reevaluation.  Will be admitted to hospitalist service.  Patient admitted to Dr. Florene Glen.   The patient appears reasonably stabilized for admission considering the current resources, flow, and capabilities available in the ED at this time, and I doubt any other [facility] requiring further screening and/or treatment in the ED prior to admission.   Final Clinical Impression(s) / ED Diagnoses Final diagnoses:  SBO (small bowel  obstruction) [facility])    Rx / DC Orders ED Discharge Orders     None        Alfredia Client, PA-C 10/10/20 1749    Fredia Sorrow, MD 10/15/20 1742

## 2020-10-10 NOTE — ED Notes (Signed)
Pt ambulatory to bathroom with steady gait. Pt able to obtain urine sample at this time. Pt tolerated well.

## 2020-10-10 NOTE — ED Notes (Signed)
Pt aware urine specimen ordered. Pt reports inability to provide specimen at this time. Specimen collection device provided to patient. 

## 2020-10-10 NOTE — ED Triage Notes (Signed)
Patient sent by UC to r/o SBO.  Abdominal pain (now resolved) and vomiting x 2 last night.

## 2020-10-10 NOTE — ED Notes (Signed)
Spoke with Abbe Amsterdam at [facility] regarding ready bed

## 2020-10-10 NOTE — Treatment Plan (Signed)
55 yo with hx cholecystectomy who presented with abdominal pain.  Symptoms for 1 week.  Imaging concerning for small bowel obstruction to level of terminal ileum, also several ill defined hypoattenuated masses throughout the liver (concerning for metastatic disease).  Called by EDP for admission given SBO.  Surgery, Dr. Brantley Stage, aware as well.  No nausea/vomiting at this time, so ED holding off on NGT.  Labs and vitals essentially unremarkable.  LFT's mildly elevated.  Admit to medsurg, inpatient.  Please consult general surgery on arrival.  Please obtain contrast enhanced abdominal MRI to better evaluated hypoattenuated liver masses.  Pending COVID test.

## 2020-10-11 ENCOUNTER — Encounter (HOSPITAL_COMMUNITY): Payer: Self-pay | Admitting: Family Medicine

## 2020-10-11 ENCOUNTER — Inpatient Hospital Stay (HOSPITAL_COMMUNITY): Payer: 59

## 2020-10-11 DIAGNOSIS — R160 Hepatomegaly, not elsewhere classified: Secondary | ICD-10-CM

## 2020-10-11 DIAGNOSIS — E871 Hypo-osmolality and hyponatremia: Secondary | ICD-10-CM

## 2020-10-11 LAB — CBC
HCT: 40.4 % (ref 36.0–46.0)
Hemoglobin: 13 g/dL (ref 12.0–15.0)
MCH: 30.4 pg (ref 26.0–34.0)
MCHC: 32.2 g/dL (ref 30.0–36.0)
MCV: 94.6 fL (ref 80.0–100.0)
Platelets: 283 10*3/uL (ref 150–400)
RBC: 4.27 MIL/uL (ref 3.87–5.11)
RDW: 15.1 % (ref 11.5–15.5)
WBC: 4.5 10*3/uL (ref 4.0–10.5)
nRBC: 0 % (ref 0.0–0.2)

## 2020-10-11 LAB — COMPREHENSIVE METABOLIC PANEL
ALT: 58 U/L — ABNORMAL HIGH (ref 0–44)
AST: 44 U/L — ABNORMAL HIGH (ref 15–41)
Albumin: 3.1 g/dL — ABNORMAL LOW (ref 3.5–5.0)
Alkaline Phosphatase: 63 U/L (ref 38–126)
Anion gap: 11 (ref 5–15)
BUN: 10 mg/dL (ref 6–20)
CO2: 24 mmol/L (ref 22–32)
Calcium: 8.7 mg/dL — ABNORMAL LOW (ref 8.9–10.3)
Chloride: 104 mmol/L (ref 98–111)
Creatinine, Ser: 0.6 mg/dL (ref 0.44–1.00)
GFR, Estimated: >60 mL/min (ref >60)
Glucose, Bld: 106 mg/dL — ABNORMAL HIGH (ref 70–99)
Potassium: 3.7 mmol/L (ref 3.5–5.1)
Sodium: 139 mmol/L (ref 135–145)
Total Bilirubin: 0.9 mg/dL (ref 0.3–1.2)
Total Protein: 6.4 g/dL — ABNORMAL LOW (ref 6.5–8.1)

## 2020-10-11 LAB — HIV ANTIBODY (ROUTINE TESTING W REFLEX): HIV Screen 4th Generation wRfx: NONREACTIVE

## 2020-10-11 MED ORDER — SODIUM CHLORIDE 0.9 % IV SOLN
INTRAVENOUS | Status: DC | PRN
  Administered 2020-10-11: 250 mL via INTRAVENOUS

## 2020-10-11 MED ORDER — PEG 3350-KCL-NA BICARB-NACL 420 G PO SOLR: 4000.0000 mL | Freq: Once | ORAL | Status: DC

## 2020-10-11 MED ORDER — SODIUM CHLORIDE 0.9 % IV SOLN
1.0000 g | INTRAVENOUS | Status: DC
  Administered 2020-10-11: 1 g via INTRAVENOUS
  Filled 2020-10-11: qty 1

## 2020-10-11 MED ORDER — PEG 3350-KCL-NA BICARB-NACL 420 G PO SOLR
4000.0000 mL | Freq: Once | ORAL | Status: AC
  Administered 2020-10-11: 4000 mL via ORAL

## 2020-10-11 MED ORDER — GADOBUTROL 1 MMOL/ML IV SOLN
10.0000 mL | Freq: Once | INTRAVENOUS | Status: AC | PRN
  Administered 2020-10-11: 10 mL via INTRAVENOUS

## 2020-10-11 NOTE — Progress Notes (Signed)
Pt back from MRI 

## 2020-10-11 NOTE — Plan of Care (Signed)
  Problem: Nutrition: Goal: Adequate nutrition will be maintained Outcome: Progressing   Problem: Coping: Goal: Level of anxiety will decrease Outcome: Progressing   Problem: Pain Managment: Goal: General experience of comfort will improve Outcome: Progressing   

## 2020-10-11 NOTE — Progress Notes (Signed)
PROGRESS NOTE  Lauren Mcpherson JKD:[id number] DOB: 31-Aug-1965   PCP: Pcp, No  Patient is from: Home.  DOA: 10/10/2020 LOS: 1  Chief complaints: Nausea and vomiting  Brief Narrative / Interim history: 55 year old F with PMH of morbid obesity and recent cholecystectomy presenting with nausea, vomiting and abdominal pain that have started the morning of presentation, and admitted for possible small bowel obstruction.  Blood work with mild transaminitis.  CT abdomen and pelvis showed several ill-defined hypoattenuated masses throughout the liver and SBO to the level of terminal ileum with thickened terminal ileum.  She did not require NG tube.  MRI ordered.  Patient was admitted and general surgery consulted.  Subjective: Seen and examined earlier this morning.  No major events overnight of this morning.  No complaints.  Reports having small BM and passing gas.  No prior history of SBO.  No personal or family history of GI disease other than cholecystectomy.  She never had colonoscopy.  She denies melena, hematochezia, fever, chills, night sweats or unintentional weight loss.  Patient denies UTI symptoms either.  Objective: Vitals:   10/10/20 2045 10/11/20 0233 10/11/20 0623 10/11/20 0756  BP: 138/76 119/69 124/79 109/69  Pulse: 64 86 84 75  Resp: 18 16 16 17   Temp: 97.9 F (36.6 C) 99 F (37.2 C) 98.7 F (37.1 C) 98.7 F (37.1 C)  TempSrc: Oral Oral Oral Oral  SpO2: 99% 96% 96% 98%  Weight:      Height:        Intake/Output Summary (Last 24 hours) at 10/11/2020 1141 Last data filed at 10/11/2020 0940 Gross per 24 hour  Intake 1371.02 ml  Output 0 ml  Net 1371.02 ml   Filed Weights   10/10/20 1458  Weight: 129.3 kg    Examination:  GENERAL: No apparent distress.  Nontoxic. HEENT: MMM.  Vision and hearing grossly intact.  NECK: Supple.  No apparent JVD.  RESP: On RA.  No IWOB.  Fair aeration bilaterally. CVS:  RRR. Heart sounds normal.  ABD/GI/GU: BS+. Abd soft, NTND.   MSK/EXT:  Moves extremities. No apparent deformity. No edema.  SKIN: no apparent skin lesion or wound NEURO: Awake, alert and oriented appropriately.  No apparent focal neuro deficit. PSYCH: Calm. Normal affect.   Procedures:  None  Microbiology summarized: [id number] and influenza PCR nonreactive.  Assessment & Plan: Small bowel obstruction: CT abdomen and pelvis showed thickened distal ileum which might be contributing to this.  Reports small BM and flatus this morning.  She has no nausea or emesis since admission. -Follow-up MRI abdomen -Currently n.p.o. pending general surgery evaluation -Continue IV fluid -General surgery following.  Liver masses: CT abdomen and pelvis showed several ill-defined hypoattenuated masses throughout the liver.  She also had thickened distal ileum.  No constitutional symptoms.  No personal or family history of cancer or GI disease. -Follow MRI abdomen and CEA -May need GI evaluation depending on what MRI shows. -Needs age-appropriate cancer screening including colonoscopy   Pyuria/bacteriuria: Patient denies UTI symptoms.  No fever or chills either. -Discontinue ceftriaxone  Hyponatremia: Likely from dehydration in the setting of nausea and vomiting.  Resolved. -Continue IV fluid  Elevated liver enzymes: Mildly elevated AST and ALT but improved. -Follow MRI  Morbid obesity Body mass index is 50.49 kg/m.  -Encourage lifestyle change to lose weight.       DVT prophylaxis:    On subcu Lovenox  Code Status: Full code Family Communication: Patient and/or RN. Available if any question.  Level of care: Med-Surg Status is: Inpatient  Remains inpatient appropriate because:Ongoing diagnostic testing needed not appropriate for outpatient work up, IV treatments appropriate due to intensity of illness or inability to take PO, and Inpatient level of care appropriate due to severity of illness  Dispo: The patient is from: Home               Anticipated d/c is to: Home              Patient currently is not medically stable to d/c.   Difficult to place patient No       Consultants:  General surgery   Sch Meds:  Scheduled Meds:  enoxaparin (LOVENOX) injection  60 mg Subcutaneous Q24H   Continuous Infusions:  sodium chloride 250 mL (10/11/20 0134)   dextrose 5% lactated ringers 125 mL/hr at 10/11/20 0928   PRN Meds:.sodium chloride, morphine injection, ondansetron **OR** ondansetron (ZOFRAN) IV  Antimicrobials: Anti-infectives (From admission, onward)    Start     Dose/Rate Route Frequency Ordered Stop   10/11/20 0100  cefTRIAXone (ROCEPHIN) 1 g in sodium chloride 0.9 % 100 mL IVPB  Status:  Discontinued        1 g 200 mL/hr over 30 Minutes Intravenous Every 24 hours 10/11/20 0003 10/11/20 1138        I have personally reviewed the following labs and images: CBC: Recent Labs  Lab 10/10/20 1511 10/11/20 0350  WBC 5.4 4.5  NEUTROABS 2.5  --   HGB 14.2 13.0  HCT 44.2 40.4  MCV 95.1 94.6  PLT 342 283   BMP &GFR Recent Labs  Lab 10/10/20 1511 10/11/20 0350  NA 134* 139  K 3.6 3.7  CL 98 104  CO2 25 24  GLUCOSE 95 106*  BUN 12 10  CREATININE 0.89 0.60  CALCIUM 8.9 8.7*   Estimated Creatinine Clearance: 105.6 mL/min (by C-G formula based on SCr of 0.6 mg/dL). Liver & Pancreas: Recent Labs  Lab 10/10/20 1511 10/11/20 0350  AST 61* 44*  ALT 66* 58*  ALKPHOS 72 63  BILITOT 0.8 0.9  PROT 7.4 6.4*  ALBUMIN 3.5 3.1*   Recent Labs  Lab 10/10/20 1511  LIPASE 23   No results for input(s): AMMONIA in the last 168 hours. Diabetic: No results for input(s): HGBA1C in the last 72 hours. No results for input(s): GLUCAP in the last 168 hours. Cardiac Enzymes: No results for input(s): CKTOTAL, CKMB, CKMBINDEX, TROPONINI in the last 168 hours. No results for input(s): PROBNP in the last 8760 hours. Coagulation Profile: No results for input(s): INR, PROTIME in the last 168 hours. Thyroid  Function Tests: No results for input(s): TSH, T4TOTAL, FREET4, T3FREE, THYROIDAB in the last 72 hours. Lipid Profile: No results for input(s): CHOL, HDL, LDLCALC, TRIG, CHOLHDL, LDLDIRECT in the last 72 hours. Anemia Panel: No results for input(s): VITAMINB12, FOLATE, FERRITIN, TIBC, IRON, RETICCTPCT in the last 72 hours. Urine analysis:    Component Value Date/Time   COLORURINE YELLOW 10/10/2020 1916   APPEARANCEUR CLEAR 10/10/2020 1916   LABSPEC <1.005 (L) 10/10/2020 1916   PHURINE 5.0 10/10/2020 1916   GLUCOSEU NEGATIVE 10/10/2020 1916   HGBUR SMALL (A) 10/10/2020 1916   BILIRUBINUR NEGATIVE 10/10/2020 1916   KETONESUR 40 (A) 10/10/2020 1916   PROTEINUR NEGATIVE 10/10/2020 1916   UROBILINOGEN 1.0 08/20/2012 1345   NITRITE POSITIVE (A) 10/10/2020 1916   LEUKOCYTESUR NEGATIVE 10/10/2020 1916   Sepsis Labs: Invalid input(s): PROCALCITONIN, LACTICIDVEN  Microbiology: Recent Results (from the past  240 hour(s))  Resp Panel by RT-PCR (Flu A&B, Covid) Nasopharyngeal Swab     Status: None   Collection Time: 10/10/20  5:18 PM   Specimen: Nasopharyngeal Swab; Nasopharyngeal(NP) swabs in vial transport medium  Result Value Ref Range Status   SARS Coronavirus 2 by RT PCR NEGATIVE NEGATIVE Final    Comment: (NOTE) SARS-CoV-2 target nucleic acids are NOT DETECTED.  The SARS-CoV-2 RNA is generally detectable in upper respiratory specimens during the acute phase of infection. The lowest concentration of SARS-CoV-2 viral copies this assay can detect is 138 copies/mL. A negative result does not preclude SARS-Cov-2 infection and should not be used as the sole basis for treatment or other patient management decisions. A negative result may occur with  improper specimen collection/handling, submission of specimen other than nasopharyngeal swab, presence of viral mutation(s) within the areas targeted by this assay, and inadequate number of viral copies(<138 copies/mL). A negative result must  be combined with clinical observations, patient history, and epidemiological information. The expected result is Negative.  Fact Sheet for Patients:  EntrepreneurPulse.com.au  Fact Sheet for Healthcare Providers:  IncredibleEmployment.be  This test is no t yet approved or cleared by the [place] FDA and  has been authorized for detection and/or diagnosis of SARS-CoV-2 by FDA under an Emergency Use Authorization (EUA). This EUA will remain  in effect (meaning this test can be used) for the duration of the COVID-19 declaration under Section 564(b)(1) of the Act, 21 U.S.C.section 360bbb-3(b)(1), unless the authorization is terminated  or revoked sooner.       Influenza A by PCR NEGATIVE NEGATIVE Final   Influenza B by PCR NEGATIVE NEGATIVE Final    Comment: (NOTE) The Xpert Xpress SARS-CoV-2/FLU/RSV plus assay is intended as an aid in the diagnosis of influenza from Nasopharyngeal swab specimens and should not be used as a sole basis for treatment. Nasal washings and aspirates are unacceptable for Xpert Xpress SARS-CoV-2/FLU/RSV testing.  Fact Sheet for Patients: EntrepreneurPulse.com.au  Fact Sheet for Healthcare Providers: IncredibleEmployment.be  This test is not yet approved or cleared by the [place] FDA and has been authorized for detection and/or diagnosis of SARS-CoV-2 by FDA under an Emergency Use Authorization (EUA). This EUA will remain in effect (meaning this test can be used) for the duration of the COVID-19 declaration under Section 564(b)(1) of the Act, 21 U.S.C. section 360bbb-3(b)(1), unless the authorization is terminated or revoked.  Performed at [facility], [address], [place], [place] [address]     Radiology Studies: CT Abdomen Pelvis W Contrast  Result Date: 10/10/2020 CLINICAL DATA:  Suspected bowel obstruction. EXAM: CT ABDOMEN AND PELVIS WITH  CONTRAST TECHNIQUE: Multidetector CT imaging of the abdomen and pelvis was performed using the standard protocol following bolus administration of intravenous contrast. CONTRAST:  138mL OMNIPAQUE IOHEXOL 300 MG/ML  SOLN COMPARISON:  None. FINDINGS: Lower chest: No acute abnormality. Hepatobiliary: Several ill-defined hypoattenuated masses throughout the liver. Status post cholecystectomy. No biliary dilatation. Pancreas: Unremarkable. No pancreatic ductal dilatation or surrounding inflammatory changes. Spleen: Normal in size without focal abnormality. Adrenals/Urinary Tract: Adrenal glands are unremarkable. Kidneys are without renal calculi, focal lesion, or hydronephrosis. Few mm probable right renal cyst. Bladder is unremarkable. Stomach/Bowel: 1.5 cm gastric diverticulum off of the posterior gastric body. There is diffuse distal small bowel dilation to the level of the terminal ileum. The appendix is normal. The colon is decompressed. Nonspecific thickening of the terminal ileum. Scattered colonic diverticulosis. Vascular/Lymphatic: No significant vascular findings are present.  No enlarged abdominal or pelvic lymph nodes. Multiple shotty nonspecific central mesenteric lymph nodes. Reproductive: No acute findings. Other: No abdominal wall hernia or abnormality. No abdominopelvic ascites. Musculoskeletal: Bilateral L5-S1 pars articularis defects. No alignment abnormalities. Spondylosis of the lower lumbosacral spine. IMPRESSION: 1. Several ill-defined hypoattenuated masses throughout the liver, indeterminate. Metastatic disease is in the differential diagnosis. Further evaluation with contrast-enhanced abdominal MRI may be considered. 2. Small bowel obstruction to the level of the terminal ileum. Thickening of the terminal ileum, soft tissue mass cannot be excluded. 3. Scattered colonic diverticulosis without evidence of acute diverticulitis. 4. Bilateral L5-S1 pars articularis defects. No alignment abnormalities.  Electronically Signed   By: Fidela Salisbury M.D.   On: 10/10/2020 16:53       Caylei Sperry T. Ramey  If 7PM-7AM, please contact night-coverage [email or web address] 10/11/2020, 11:41 AM

## 2020-10-11 NOTE — Progress Notes (Signed)
Pt shifted for MRI

## 2020-10-11 NOTE — Consult Note (Signed)
[facility] Surgery Consult Note  Lauren Mcpherson April 09, 1966  [id number].    Requesting MD: Wendee Beavers Chief Complaint/Reason for Consult: SBO at terminal ileum  HPI:  Lauren Mcpherson is a 55yo female with no significant PMH who presented to [facility] yesterday with worsening abdominal pain. States that her symptoms started about 2 weeks ago. Initially thought she had a GI bug causing abdominal pain, bloating, diarrhea, and 1 episode of emesis. Her symptoms waxed/waned and then progressively got worse starting about 4 days ago. She reports worsening abdominal pain and a few episodes of emesis so she came to the ED. Denies fever, chills, hematochezia, melena, change in stool caliber, or unintentional weight loss. States that she has never had pain like this before. She has never had an SBO before. She has never had a colonoscopy. No family h/o colon cancer. In the ED she underwent CT scan which shows SBO to the  level of the terminal ileum; thickening of the terminal ileum, soft tissue mass cannot be excluded; several ill-defined hypoattenuated masses throughout the liver, indeterminate. Patient was admitted to the medical service. General surgery asked to see. Today she states that she is feeling much better. Abdomen is sore but no longer having pain. No further n/v. Passing flatus and had a soft/loose BM this morning.  Abdominal surgical history: laparoscopic cholecystectomy, c section Anticoagulants: none Nonsmoker Drinks 0-4 beers daily, no prior h/o alcohol withdrawal Employment: Freight forwarder at [employer]  Constitutional: Negative.   Respiratory: Negative.    Cardiovascular: Negative.   Gastrointestinal:  Positive for abdominal pain, diarrhea, nausea and vomiting. Negative for blood in stool and melena.  Genitourinary: Negative.    All systems reviewed and otherwise negative except for as above  History reviewed. No pertinent family history.  History reviewed. No  pertinent past medical history.  Past Surgical History:  Procedure Laterality Date   CHOLECYSTECTOMY     TONSILLECTOMY      Social History:  reports that she has quit smoking. She does not have any smokeless tobacco history on file. She reports current alcohol use. She reports that she does not use drugs.  Allergies: No Known Allergies  Medications Prior to Admission  Medication Sig Dispense Refill   diclofenac (VOLTAREN) 75 MG EC tablet Take 75 mg by mouth daily as needed for moderate pain or mild pain.      Prior to Admission medications   Medication Sig Start Date End Date Taking? Authorizing Provider  diclofenac (VOLTAREN) 75 MG EC tablet Take 75 mg by mouth daily as needed for moderate pain or mild pain.   Yes [provider]    Blood pressure 109/69, pulse 75, temperature 98.7 F (37.1 C), temperature source Oral, resp. rate 17, height 5\' 3"  (1.6 m), weight 129.3 kg, SpO2 98 %. Physical Exam: General: pleasant, WD/WN female who is laying in bed in NAD HEENT: head is normocephalic, atraumatic.  Sclera are noninjected.  Pupils equal and round.  Ears and nose without any masses or lesions.  Mouth is pink and moist. Dentition fair Heart: regular, rate, and rhythm.  Normal s1,s2. No obvious murmurs, gallops, or rubs noted.  Palpable pedal pulses bilaterally  Lungs: CTAB, no wheezes, rhonchi, or rales noted.  Respiratory effort nonlabored Abd: soft, NT/ND, +BS, no masses, hernias, or organomegaly MS: no BUE/BLE edema, calves soft and nontender Skin: warm and dry with no masses, lesions, or rashes Psych: A&Ox4 with an appropriate affect Neuro: cranial nerves grossly intact, equal strength  in BUE/BLE bilaterally, normal speech, thought process intact  Results for orders placed or performed during the hospital encounter of 10/10/20 (from the past 48 hour(s))  CBC with Differential     Status: None   Collection Time: 10/10/20  3:11 PM  Result Value Ref Range   WBC 5.4 4.0  - 10.5 K/uL   RBC 4.65 3.87 - 5.11 MIL/uL   Hemoglobin 14.2 12.0 - 15.0 g/dL   HCT 44.2 36.0 - 46.0 %   MCV 95.1 80.0 - 100.0 fL   MCH 30.5 26.0 - 34.0 pg   MCHC 32.1 30.0 - 36.0 g/dL   RDW 15.3 11.5 - 15.5 %   Platelets 342 150 - 400 K/uL   nRBC 0.0 0.0 - 0.2 %   Neutrophils Relative % 46 %   Neutro Abs 2.5 1.7 - 7.7 K/uL   Lymphocytes Relative 42 %   Lymphs Abs 2.3 0.7 - 4.0 K/uL   Monocytes Relative 8 %   Monocytes Absolute 0.4 0.1 - 1.0 K/uL   Eosinophils Relative 3 %   Eosinophils Absolute 0.1 0.0 - 0.5 K/uL   Basophils Relative 1 %   Basophils Absolute 0.0 0.0 - 0.1 K/uL   Immature Granulocytes 0 %   Abs Immature Granulocytes 0.01 0.00 - 0.07 K/uL    Comment: Performed at [facility], [address] [place], [place] [address]  Lipase, blood     Status: None   Collection Time: 10/10/20  3:11 PM  Result Value Ref Range   Lipase 23 11 - 51 U/L    Comment: Performed at [facility], [address] [place], [place] [address]  Comprehensive metabolic panel     Status: Abnormal   Collection Time: 10/10/20  3:11 PM  Result Value Ref Range   Sodium 134 (L) 135 - 145 mmol/L   Potassium 3.6 3.5 - 5.1 mmol/L   Chloride 98 98 - 111 mmol/L   CO2 25 22 - 32 mmol/L   Glucose, Bld 95 70 - 99 mg/dL    Comment: Glucose reference range applies only to samples taken after fasting for at least 8 hours.   BUN 12 6 - 20 mg/dL   Creatinine, Ser 0.89 0.44 - 1.00 mg/dL   Calcium 8.9 8.9 - 10.3 mg/dL   Total Protein 7.4 6.5 - 8.1 g/dL   Albumin 3.5 3.5 - 5.0 g/dL   AST 61 (H) 15 - 41 U/L   ALT 66 (H) 0 - 44 U/L   Alkaline Phosphatase 72 38 - 126 U/L   Total Bilirubin 0.8 0.3 - 1.2 mg/dL   GFR, Estimated >60 >60 mL/min    Comment: (NOTE) Calculated using the CKD-EPI Creatinine Equation (2021)    Anion gap 11 5 - 15    Comment: Performed at [facility], [address] [place], [place] [address]  Resp Panel by RT-PCR (Flu A&B, Covid) Nasopharyngeal  Swab     Status: None   Collection Time: 10/10/20  5:18 PM   Specimen: Nasopharyngeal Swab; Nasopharyngeal(NP) swabs in vial transport medium  Result Value Ref Range   SARS Coronavirus 2 by RT PCR NEGATIVE NEGATIVE    Comment: (NOTE) SARS-CoV-2 target nucleic acids are NOT DETECTED.  The SARS-CoV-2 RNA is generally detectable in upper respiratory specimens during the acute phase of infection. The lowest concentration of SARS-CoV-2 viral copies this assay can detect is 138 copies/mL. A negative result does not preclude SARS-Cov-2 infection and should  not be used as the sole basis for treatment or other patient management decisions. A negative result may occur with  improper specimen collection/handling, submission of specimen other than nasopharyngeal swab, presence of viral mutation(s) within the areas targeted by this assay, and inadequate number of viral copies(<138 copies/mL). A negative result must be combined with clinical observations, patient history, and epidemiological information. The expected result is Negative.  Fact Sheet for Patients:  EntrepreneurPulse.com.au  Fact Sheet for Healthcare Providers:  IncredibleEmployment.be  This test is no t yet approved or cleared by the [place] FDA and  has been authorized for detection and/or diagnosis of SARS-CoV-2 by FDA under an Emergency Use Authorization (EUA). This EUA will remain  in effect (meaning this test can be used) for the duration of the COVID-19 declaration under Section 564(b)(1) of the Act, 21 U.S.C.section 360bbb-3(b)(1), unless the authorization is terminated  or revoked sooner.       Influenza A by PCR NEGATIVE NEGATIVE   Influenza B by PCR NEGATIVE NEGATIVE    Comment: (NOTE) The Xpert Xpress SARS-CoV-2/FLU/RSV plus assay is intended as an aid in the diagnosis of influenza from Nasopharyngeal swab specimens and should not be used as a sole basis for treatment.  Nasal washings and aspirates are unacceptable for Xpert Xpress SARS-CoV-2/FLU/RSV testing.  Fact Sheet for Patients: EntrepreneurPulse.com.au  Fact Sheet for Healthcare Providers: IncredibleEmployment.be  This test is not yet approved or cleared by the [place] FDA and has been authorized for detection and/or diagnosis of SARS-CoV-2 by FDA under an Emergency Use Authorization (EUA). This EUA will remain in effect (meaning this test can be used) for the duration of the COVID-19 declaration under Section 564(b)(1) of the Act, 21 U.S.C. section 360bbb-3(b)(1), unless the authorization is terminated or revoked.  Performed at [facility], [address] [place], [place] [address]   Urinalysis, Routine w reflex microscopic Urine, Clean Catch     Status: Abnormal   Collection Time: 10/10/20  7:16 PM  Result Value Ref Range   Color, Urine YELLOW YELLOW   APPearance CLEAR CLEAR   Specific Gravity, Urine <1.005 (L) 1.005 - 1.030   pH 5.0 5.0 - 8.0   Glucose, UA NEGATIVE NEGATIVE mg/dL   Hgb urine dipstick SMALL (A) NEGATIVE   Bilirubin Urine NEGATIVE NEGATIVE   Ketones, ur 40 (A) NEGATIVE mg/dL   Protein, ur NEGATIVE NEGATIVE mg/dL   Nitrite POSITIVE (A) NEGATIVE   Leukocytes,Ua NEGATIVE NEGATIVE    Comment: Performed at [facility], [address] [place], [place] [address]  Urinalysis, Microscopic (reflex)     Status: Abnormal   Collection Time: 10/10/20  7:16 PM  Result Value Ref Range   RBC / HPF 0-5 0 - 5 RBC/hpf   WBC, UA 6-10 0 - 5 WBC/hpf   Bacteria, UA MANY (A) NONE SEEN   Squamous Epithelial / LPF 6-10 0 - 5    Comment: Performed at [facility], [address] [place], [place] [address]  HIV Antibody (routine testing w rflx)     Status: None   Collection Time: 10/11/20  3:50 AM  Result Value Ref Range   HIV Screen 4th Generation wRfx Non Reactive Non Reactive    Comment: Performed at [facility], [place] [address] [place], [place] [address]  Comprehensive metabolic panel     Status: Abnormal   Collection Time: 10/11/20  3:50 AM  Result Value Ref Range   Sodium  139 135 - 145 mmol/L   Potassium 3.7 3.5 - 5.1 mmol/L   Chloride 104 98 - 111 mmol/L   CO2 24 22 - 32 mmol/L   Glucose, Bld 106 (H) 70 - 99 mg/dL    Comment: Glucose reference range applies only to samples taken after fasting for at least 8 hours.   BUN 10 6 - 20 mg/dL   Creatinine, Ser 0.60 0.44 - 1.00 mg/dL   Calcium 8.7 (L) 8.9 - 10.3 mg/dL   Total Protein 6.4 (L) 6.5 - 8.1 g/dL   Albumin 3.1 (L) 3.5 - 5.0 g/dL   AST 44 (H) 15 - 41 U/L   ALT 58 (H) 0 - 44 U/L   Alkaline Phosphatase 63 38 - 126 U/L   Total Bilirubin 0.9 0.3 - 1.2 mg/dL   GFR, Estimated >60 >60 mL/min    Comment: (NOTE) Calculated using the CKD-EPI Creatinine Equation (2021)    Anion gap 11 5 - 15    Comment: Performed at [facility], [place] [address], [place], [place] [address]  CBC     Status: None   Collection Time: 10/11/20  3:50 AM  Result Value Ref Range   WBC 4.5 4.0 - 10.5 K/uL   RBC 4.27 3.87 - 5.11 MIL/uL   Hemoglobin 13.0 12.0 - 15.0 g/dL   HCT 40.4 36.0 - 46.0 %   MCV 94.6 80.0 - 100.0 fL   MCH 30.4 26.0 - 34.0 pg   MCHC 32.2 30.0 - 36.0 g/dL   RDW 15.1 11.5 - 15.5 %   Platelets 283 150 - 400 K/uL   nRBC 0.0 0.0 - 0.2 %    Comment: Performed at [facility], [place] [address], [place], [place] [address]   CT Abdomen Pelvis W Contrast  Result Date: 10/10/2020 CLINICAL DATA:  Suspected bowel obstruction. EXAM: CT ABDOMEN AND PELVIS WITH CONTRAST TECHNIQUE: Multidetector CT imaging of the abdomen and pelvis was performed using the standard protocol following bolus administration of intravenous contrast. CONTRAST:  132mL OMNIPAQUE IOHEXOL 300 MG/ML  SOLN COMPARISON:  None. FINDINGS: Lower chest: No acute abnormality. Hepatobiliary: Several ill-defined hypoattenuated masses throughout the  liver. Status post cholecystectomy. No biliary dilatation. Pancreas: Unremarkable. No pancreatic ductal dilatation or surrounding inflammatory changes. Spleen: Normal in size without focal abnormality. Adrenals/Urinary Tract: Adrenal glands are unremarkable. Kidneys are without renal calculi, focal lesion, or hydronephrosis. Few mm probable right renal cyst. Bladder is unremarkable. Stomach/Bowel: 1.5 cm gastric diverticulum off of the posterior gastric body. There is diffuse distal small bowel dilation to the level of the terminal ileum. The appendix is normal. The colon is decompressed. Nonspecific thickening of the terminal ileum. Scattered colonic diverticulosis. Vascular/Lymphatic: No significant vascular findings are present. No enlarged abdominal or pelvic lymph nodes. Multiple shotty nonspecific central mesenteric lymph nodes. Reproductive: No acute findings. Other: No abdominal wall hernia or abnormality. No abdominopelvic ascites. Musculoskeletal: Bilateral L5-S1 pars articularis defects. No alignment abnormalities. Spondylosis of the lower lumbosacral spine. IMPRESSION: 1. Several ill-defined hypoattenuated masses throughout the liver, indeterminate. Metastatic disease is in the differential diagnosis. Further evaluation with contrast-enhanced abdominal MRI may be considered. 2. Small bowel obstruction to the level of the terminal ileum. Thickening of the terminal ileum, soft tissue mass cannot be excluded. 3. Scattered colonic diverticulosis without evidence of acute diverticulitis. 4. Bilateral L5-S1 pars articularis defects. No alignment abnormalities. Electronically Signed   By: Fidela Salisbury M.D.   On: 10/10/2020 16:53    Anti-infectives (From admission, onward)  Start     Dose/Rate Route Frequency Ordered Stop   10/11/20 0100  cefTRIAXone (ROCEPHIN) 1 g in sodium chloride 0.9 % 100 mL IVPB  Status:  Discontinued        1 g 200 mL/hr over 30 Minutes Intravenous Every 24 hours 10/11/20  0003 10/11/20 1138        Assessment/Plan Small bowel obstruction to level of terminal ileum Thickening of the terminal ileum Liver lesions - SBO possibly secondary to soft tissue mass at terminal ileum. Will ask GI to see for possibility of endoscopic evaluation. She has never had a colonoscopy. Keep NPO until seen by GI, if no procedures planned today likely could have clear liquids as she is passing flatus and had a BM.  - MR abdomen pending to further evaluate liver lesions - check CEA - We will follow  ID - none currently VTE - SCDs, lovenox FEN - IVF, NPO Foley - none Follow up - TBD  Obesity BMI 50.49  Wellington Hampshire, [facility] Surgery 10/11/2020, 11:27 AM Please see Amion for pager number during day hours 7:00am-4:30pm

## 2020-10-11 NOTE — Consult Note (Addendum)
Referring Provider: Margie Billet, PA-C ([facility]) Primary Care Physician:  Pcp, No Primary Gastroenterologist:  Althia Forts  Reason for Consultation: SBO, Terminal ileum thickening on CT  HPI: Lauren Mcpherson is a 55 y.o. female with past medical history of recent cholecystectomy, obesity (BMI 50.49) presenting for consultation of abnormal CT of Terminal ileum.  Patient states that at the beginning of June, she thought she had a stomach bug, which consisted of abdominal pain and diarrhea.  She states symptoms improved for approximately 3 days then recurred.  She has also had some intermittent nausea and has had a couple episodes of vomiting over the last few days.  For the past couple of weeks, she has been having symptoms of varying intensity, though they worsened over the last several days and thus she presented to the ED yesterday.  Today, she states her abdominal pain has improved.  Denies any melena or hematochezia.  Further denies changes in appetite, unexplained weight loss, dysphagia.  Denies aspirin, NSAID, or blood thinner use.  She has never had a colonoscopy.  Denies family history of colon cancer or gastrointestinal malignancy.  CT 10/10/20: Several ill-defined hypoattenuated masses throughout the liver, indeterminate. Metastatic disease is in the differential diagnosis. Further evaluation with contrast-enhanced abdominal MRI may be considered. Small bowel obstruction to the level of the terminal ileum. Thickening of the terminal ileum, soft tissue mass cannot be excluded.  MRI abdomen/pelvis 10/11/20: Partially imaged terminal ileal thickening with increased mural enhancement and numerous prominent but nonenlarged adjacent ileocolic lymph nodes. This could represent inflammatory bowel disease such as Crohn's ileitis, however, the possibility of a malignant lesion warrants consideration, particularly in light of the liver lesions. This appears associated with small bowel obstruction, similar to  recent CT examination.   History reviewed. No pertinent past medical history.  Past Surgical History:  Procedure Laterality Date   CHOLECYSTECTOMY     TONSILLECTOMY      Prior to Admission medications   Medication Sig Start Date End Date Taking? Authorizing Provider  diclofenac (VOLTAREN) 75 MG EC tablet Take 75 mg by mouth daily as needed for moderate pain or mild pain.   Yes [provider]    Scheduled Meds:  enoxaparin (LOVENOX) injection  60 mg Subcutaneous Q24H   Continuous Infusions:  sodium chloride 250 mL (10/11/20 0134)   dextrose 5% lactated ringers 125 mL/hr at 10/11/20 0928   PRN Meds:.sodium chloride, morphine injection, ondansetron **OR** ondansetron (ZOFRAN) IV  Allergies as of 10/10/2020   (No Known Allergies)    History reviewed. No pertinent family history.  Social History   Socioeconomic History   Marital status: Married    Spouse name: Not on file   Number of children: Not on file   Years of education: Not on file   Highest education level: Not on file  Occupational History   Not on file  Tobacco Use   Smoking status: Former    Pack years: 0.00   Smokeless tobacco: Not on file  Vaping Use   Vaping Use: Never used  Substance and Sexual Activity   Alcohol use: Yes    Comment: occ   Drug use: No   Sexual activity: Yes    Birth control/protection: None  Other Topics Concern   Not on file  Social History Narrative   Not on file   Social Determinants of Health   Financial Resource Strain: Not on file  Food Insecurity: Not on file  Transportation Needs: Not on file  Physical Activity: Not  on file  Stress: Not on file  Social Connections: Not on file  Intimate Partner Violence: Not on file    Review of Systems: Review of Systems  Constitutional:  Negative for fever and weight loss.  HENT:  Negative for hearing loss and tinnitus.   Eyes:  Negative for pain and redness.  Respiratory:  Negative for cough and shortness of  breath.   Cardiovascular:  Negative for chest pain and palpitations.  Gastrointestinal:  Positive for abdominal pain, diarrhea, nausea and vomiting. Negative for blood in stool, constipation, heartburn and melena.  Genitourinary:  Negative for flank pain and hematuria.  Musculoskeletal:  Negative for falls and joint pain.  Skin:  Negative for itching and rash.  Neurological:  Negative for seizures and loss of consciousness.  Endo/Heme/Allergies:  Negative for polydipsia. Does not bruise/bleed easily.  Psychiatric/Behavioral:  Negative for substance abuse. The patient is not nervous/anxious.    Physical Exam: Vital signs: Vitals:   10/11/20 0623 10/11/20 0756  BP: 124/79 109/69  Pulse: 84 75  Resp: 16 17  Temp: 98.7 F (37.1 C) 98.7 F (37.1 C)  SpO2: 96% 98%   Last BM Date: 10/11/20  Physical Exam Vitals reviewed.  Constitutional:      General: She is not in acute distress.    Appearance: She is morbidly obese.  HENT:     Head: Normocephalic and atraumatic.     Nose: Nose normal. No congestion.     Mouth/Throat:     Mouth: Mucous membranes are moist.     Pharynx: Oropharynx is clear.  Eyes:     General: No scleral icterus.    Extraocular Movements: Extraocular movements intact.  Cardiovascular:     Rate and Rhythm: Normal rate and regular rhythm.  Pulmonary:     Effort: Pulmonary effort is normal. No respiratory distress.  Abdominal:     General: Bowel sounds are decreased. There is no distension.     Palpations: Abdomen is soft.     Tenderness: There is no abdominal tenderness. There is no guarding or rebound.  Musculoskeletal:        General: No swelling or tenderness.     Cervical back: Normal range of motion and neck supple.  Skin:    General: Skin is warm and dry.  Neurological:     General: No focal deficit present.     Mental Status: She is alert and oriented to person, place, and time.  Psychiatric:        Mood and Affect: Mood normal.        Behavior:  Behavior normal.    GI:  Lab Results: Recent Labs    10/10/20 1511 10/11/20 0350  WBC 5.4 4.5  HGB 14.2 13.0  HCT 44.2 40.4  PLT 342 283   BMET Recent Labs    10/10/20 1511 10/11/20 0350  NA 134* 139  K 3.6 3.7  CL 98 104  CO2 25 24  GLUCOSE 95 106*  BUN 12 10  CREATININE 0.89 0.60  CALCIUM 8.9 8.7*   LFT Recent Labs    10/11/20 0350  PROT 6.4*  ALBUMIN 3.1*  AST 44*  ALT 58*  ALKPHOS 63  BILITOT 0.9   PT/INR No results for input(s): LABPROT, INR in the last 72 hours.   Studies/Results: MR ABDOMEN W WO CONTRAST  Result Date: 10/11/2020 CLINICAL DATA:  55 year old female with history of abdominal pain. Indeterminate liver lesions noted on recent CT examination. Evaluate for potential metastatic disease. EXAM: MRI ABDOMEN  WITHOUT AND WITH CONTRAST TECHNIQUE: Multiplanar multisequence MR imaging of the abdomen was performed both before and after the administration of intravenous contrast. CONTRAST:  11mL GADAVIST GADOBUTROL 1 MMOL/ML IV SOLN COMPARISON:  No prior abdominal MRI. CT the abdomen and pelvis 10/10/2020. FINDINGS: Lower chest: Unremarkable. Hepatobiliary: There are multiple lesions scattered throughout the liver which are mildly T1 hypointense, slightly T2 hyperintense and demonstrate a hypovascular appearance on post gadolinium images concerning for metastatic lesions. Specific examples include a 2.1 x 1.4 cm lesion in the periphery of the liver between segments 4A and 8 (axial image 31 of series 22), a 1.8 x 1.6 cm lesion in the caudate lobe (axial image 45 of series 2), and a 1.5 x 1.3 cm lesion in segment 7 (axial image 44 of series 22). There is also a small 7 mm lesion between segments 7 and 8 (axial image 43 of series 22). No intra or extrahepatic biliary ductal dilatation. Status post cholecystectomy. Pancreas: No pancreatic mass. No pancreatic ductal dilatation. No pancreatic or peripancreatic fluid collections or inflammatory changes. Spleen:   Unremarkable. Adrenals/Urinary Tract: 1.3 x 0.7 cm T1 hypointense, T2 hyperintense, nonenhancing lesion in the upper pole of the right kidney, compatible with a simple cyst. No aggressive appearing renal lesions. No hydroureteronephrosis in the visualized portions of the abdomen. Bilateral adrenal glands are normal in appearance. Stomach/Bowel: Several dilated loops of small bowel measuring up to 4.1 cm in diameter with multiple air-fluid levels. Terminal ileum is partially imaged on several pulse sequences, and appears thickened with increased mural enhancement. Vascular/Lymphatic: No aneurysm identified in the visualized abdominal vasculature. Several prominent but nonenlarged ileocolic lymph nodes are noted. No other definite enlarged lymph nodes are noted elsewhere in the visualized portions of the abdomen. Other: No significant volume of ascites noted in the visualized portions of the peritoneal cavity. Musculoskeletal: No aggressive appearing osseous lesions are noted in the visualized portions of the skeleton. IMPRESSION: 1. Multiple liver lesions concerning for metastatic disease, as detailed above. 2. Partially imaged terminal ileal thickening with increased mural enhancement and numerous prominent but nonenlarged adjacent ileocolic lymph nodes. This could represent inflammatory bowel disease such as Crohn's ileitis, however, the possibility of a malignant lesion warrants consideration, particularly in light of the liver lesions. This appears associated with small bowel obstruction, similar to recent CT examination. Electronically Signed   By: Vinnie Langton M.D.   On: 10/11/2020 12:03   CT Abdomen Pelvis W Contrast  Result Date: 10/10/2020 CLINICAL DATA:  Suspected bowel obstruction. EXAM: CT ABDOMEN AND PELVIS WITH CONTRAST TECHNIQUE: Multidetector CT imaging of the abdomen and pelvis was performed using the standard protocol following bolus administration of intravenous contrast. CONTRAST:  162mL  OMNIPAQUE IOHEXOL 300 MG/ML  SOLN COMPARISON:  None. FINDINGS: Lower chest: No acute abnormality. Hepatobiliary: Several ill-defined hypoattenuated masses throughout the liver. Status post cholecystectomy. No biliary dilatation. Pancreas: Unremarkable. No pancreatic ductal dilatation or surrounding inflammatory changes. Spleen: Normal in size without focal abnormality. Adrenals/Urinary Tract: Adrenal glands are unremarkable. Kidneys are without renal calculi, focal lesion, or hydronephrosis. Few mm probable right renal cyst. Bladder is unremarkable. Stomach/Bowel: 1.5 cm gastric diverticulum off of the posterior gastric body. There is diffuse distal small bowel dilation to the level of the terminal ileum. The appendix is normal. The colon is decompressed. Nonspecific thickening of the terminal ileum. Scattered colonic diverticulosis. Vascular/Lymphatic: No significant vascular findings are present. No enlarged abdominal or pelvic lymph nodes. Multiple shotty nonspecific central mesenteric lymph nodes. Reproductive: No acute findings. Other:  No abdominal wall hernia or abnormality. No abdominopelvic ascites. Musculoskeletal: Bilateral L5-S1 pars articularis defects. No alignment abnormalities. Spondylosis of the lower lumbosacral spine. IMPRESSION: 1. Several ill-defined hypoattenuated masses throughout the liver, indeterminate. Metastatic disease is in the differential diagnosis. Further evaluation with contrast-enhanced abdominal MRI may be considered. 2. Small bowel obstruction to the level of the terminal ileum. Thickening of the terminal ileum, soft tissue mass cannot be excluded. 3. Scattered colonic diverticulosis without evidence of acute diverticulitis. 4. Bilateral L5-S1 pars articularis defects. No alignment abnormalities. Electronically Signed   By: Fidela Salisbury M.D.   On: 10/10/2020 16:53    Impression: SBO at the level of the terminal ileum, terminal ileum thickening with increased mural  enhancement, multiple liver lesions concerning for metastatic disease -Unclear if TI thickening is inflammatory, though liver lesions raise concern for malignancy. No prior colonoscopy. -Normal CBC (Hgb 13.0) -T. Bili 0.9/ AST 44/ ALT 58/ ALP 63 -CEA pending  Plan: Discussed with Dr. Cristina Gong who recommends 2-day prep to ensure adequate visualization.  Will start one jug of NuLYTELY today with plans to finish prep tomorrow.  Clear liquid diet.  Plan for colonoscopy Wednesday 6/22.  I thoroughly discussed procedure with the patient to include nature, alternatives, benefits, and risks (including but not limited to bleeding, infection, perforation, anesthesia/cardiac and pulmonary complications).  Patient verbalized understanding and gave verbal consent to proceed with colonoscopy.  Continue supportive care.  [facility] will follow.    LOS: 1 day   Salley Slaughter  PA-C 10/11/2020, 1:14 PM  Contact #  [phone]

## 2020-10-12 DIAGNOSIS — K56609 Unspecified intestinal obstruction, unspecified as to partial versus complete obstruction: Secondary | ICD-10-CM | POA: Diagnosis not present

## 2020-10-12 LAB — CEA: CEA: 37.7 ng/mL — ABNORMAL HIGH (ref 0.0–4.7)

## 2020-10-12 LAB — COMPREHENSIVE METABOLIC PANEL
ALT: 51 U/L — ABNORMAL HIGH (ref 0–44)
AST: 39 U/L (ref 15–41)
Albumin: 3 g/dL — ABNORMAL LOW (ref 3.5–5.0)
Alkaline Phosphatase: 64 U/L (ref 38–126)
Anion gap: 6 (ref 5–15)
BUN: 7 mg/dL (ref 6–20)
CO2: 27 mmol/L (ref 22–32)
Calcium: 8.7 mg/dL — ABNORMAL LOW (ref 8.9–10.3)
Chloride: 106 mmol/L (ref 98–111)
Creatinine, Ser: 0.76 mg/dL (ref 0.44–1.00)
GFR, Estimated: >60 mL/min (ref >60)
Glucose, Bld: 141 mg/dL — ABNORMAL HIGH (ref 70–99)
Potassium: 3.3 mmol/L — ABNORMAL LOW (ref 3.5–5.1)
Sodium: 139 mmol/L (ref 135–145)
Total Bilirubin: 0.5 mg/dL (ref 0.3–1.2)
Total Protein: 6.4 g/dL — ABNORMAL LOW (ref 6.5–8.1)

## 2020-10-12 LAB — SEDIMENTATION RATE: Sed Rate: 15 mm/hr (ref 0–22)

## 2020-10-12 LAB — C-REACTIVE PROTEIN: CRP: 1.3 mg/dL — ABNORMAL HIGH (ref <1.0)

## 2020-10-12 LAB — PHOSPHORUS: Phosphorus: 2.9 mg/dL (ref 2.5–4.6)

## 2020-10-12 LAB — MAGNESIUM: Magnesium: 1.9 mg/dL (ref 1.7–2.4)

## 2020-10-12 MED ORDER — POTASSIUM CHLORIDE CRYS ER 20 MEQ PO TBCR
60.0000 meq | EXTENDED_RELEASE_TABLET | Freq: Once | ORAL | Status: AC
  Administered 2020-10-12: 60 meq via ORAL
  Filled 2020-10-12: qty 3

## 2020-10-12 NOTE — Progress Notes (Addendum)
[facility] Progress Note  Lauren Mcpherson 55 y.o. 02/22/1966  CC:  SBO, Terminal ileum thickening on CT   Subjective: Patient states she had some nausea and vomiting after drinking prep yesterday.  She states she drank about 5 cups of prep and has had some loose brownish yellow stool.  She is tolerating clears today without nausea and vomiting.  No abdominal pain.  ROS : Review of Systems  Cardiovascular:  Negative for chest pain and palpitations.  Gastrointestinal:  Positive for diarrhea, nausea and vomiting. Negative for abdominal pain, blood in stool, constipation, heartburn and melena.     Objective: Vital signs in last 24 hours: Vitals:   10/11/20 2134 10/12/20 0614  BP: (!) 152/85 102/78  Pulse: 67 91  Resp: 16 (!) 24  Temp: 97.9 F (36.6 C) 98 F (36.7 C)  SpO2: 100% 97%    Physical Exam:  General:  Alert, cooperative, no distress  Head:  Normocephalic, without obvious abnormality, atraumatic  Eyes:  Anicteric sclera, EOMs intact  Lungs:   Clear to auscultation bilaterally, respirations unlabored  Heart:  Regular rate and rhythm, S1, S2 normal  Abdomen:   Soft, non-tender, non-distended, bowel sounds active all four quadrants  Extremities: Extremities normal, atraumatic, no  edema    Lab Results: Recent Labs    10/11/20 0350 10/12/20 0344  NA 139 139  K 3.7 3.3*  CL 104 106  CO2 24 27  GLUCOSE 106* 141*  BUN 10 7  CREATININE 0.60 0.76  CALCIUM 8.7* 8.7*  MG  --  1.9  PHOS  --  2.9   Recent Labs    10/11/20 0350 10/12/20 0344  AST 44* 39  ALT 58* 51*  ALKPHOS 63 64  BILITOT 0.9 0.5  PROT 6.4* 6.4*  ALBUMIN 3.1* 3.0*   Recent Labs    10/10/20 1511 10/11/20 0350  WBC 5.4 4.5  NEUTROABS 2.5  --   HGB 14.2 13.0  HCT 44.2 40.4  MCV 95.1 94.6  PLT 342 283   No results for input(s): LABPROT, INR in the last 72 hours.   Assessment: SBO at the level of the terminal ileum, terminal ileum thickening with increased mural  enhancement, multiple liver lesions concerning for metastatic disease -Unclear if TI thickening is inflammatory, though liver lesions raise concern for malignancy. No prior colonoscopy. -Normal CBC (Hgb 13.0) as of 6/20 -T. Bili 0.5/ AST 39/ ALT 51/ ALP 64 -Mildly elevated CRP (1.3), normal ESR -CEA pending  Abnormal screening mammogram 08/04/20: possible right breast mass.  Diagnostic mammogram 08/26/20 revealed Intramammary lymph node over the 10 o'clock position of the right  breast 12 cm from the nipple accounting for the mammographic finding  with borderline cortical thickening as this likely represents a benign reactive lymph node.  FU [place] in 3 months was recommended.   Plan: Plan for colonoscopy tomorrow 6/22.  Clear liquid diet, completion of bowel prep, NPO at midnight.  Agree with liver biopsy for further evaluation of liver lesions.  Continue supportive care.  [facility] will follow.  Salley Slaughter PA-C 10/12/2020, 10:25 AM  Contact #  [phone]

## 2020-10-12 NOTE — Progress Notes (Signed)
I reviewed and concur with student's documentation.

## 2020-10-12 NOTE — Progress Notes (Signed)
PROGRESS NOTE  Lauren Mcpherson VHQ:[id number] DOB: 11-05-1965   PCP: Pcp, No  Patient is from: Home.  DOA: 10/10/2020 LOS: 2  Chief complaints: Nausea and vomiting  Brief Narrative / Interim history: 55 year old F with PMH of morbid obesity and recent cholecystectomy presenting with nausea, vomiting and abdominal pain that have started the morning of presentation, and admitted for possible small bowel obstruction.  Blood work with mild transaminitis.  CT abdomen and pelvis showed several ill-defined hypoattenuated masses throughout the liver and SBO to the level of terminal ileum with thickened terminal ileum.  She did not require NG tube.  MRI ordered.  Patient was admitted and general surgery consulted.  Subjective: Seen and examined earlier this morning.  No major events overnight of this morning.  No complaints.  Reports having small BM and passing gas. Stools are clearing.  One episode of vomiting last night but otherwise tolerating bowel prep.   Objective: Vitals:   10/11/20 1359 10/11/20 2134 10/12/20 0614 10/12/20 1207  BP: 124/83 (!) 152/85 102/78 118/70  Pulse: 67 67 91 81  Resp: 18 16 (!) 24 18  Temp: 98.8 F (37.1 C) 97.9 F (36.6 C) 98 F (36.7 C) 98.5 F (36.9 C)  TempSrc: Oral Oral Oral Oral  SpO2: 96% 100% 97% 97%  Weight:      Height:        Intake/Output Summary (Last 24 hours) at 10/12/2020 1258 Last data filed at 10/12/2020 4132 Gross per 24 hour  Intake 2209.11 ml  Output 1 ml  Net 2208.11 ml   Filed Weights   10/10/20 1458  Weight: 129.3 kg    Examination:  GENERAL: No apparent distress.  Nontoxic. HEENT: MMM.  Vision and hearing grossly intact.  NECK: Supple.  No apparent JVD.  RESP: On RA.  No IWOB.  Fair aeration bilaterally. CVS:  RRR. Heart sounds normal.  ABD/GI/GU: BS+. Abd soft, NTND.  MSK/EXT:  Moves extremities. No apparent deformity. No edema.  SKIN: no apparent skin lesion or wound NEURO: Awake, alert and oriented appropriately.   No apparent focal neuro deficit. PSYCH: Calm. Normal affect.   Procedures:  None  Microbiology summarized: [id number] and influenza PCR nonreactive.  Assessment & Plan: Small bowel obstruction, partial: CT abdomen and pelvis showed thickened distal ileum which might be contributing to this.  Reports small BM and flatus this morning.  One episode emesis. No NG tube. Abdomen soft -Follow-up MRI abdomen - continue clears -General surgery following and GI - plan for colonoscopy tomorrow  Liver masses: CT abdomen and pelvis showed several ill-defined hypoattenuated masses throughout the liver.  She also had thickened distal ileum.  No constitutional symptoms.  No personal or family history of cancer or GI disease. Birads 3 mammo earlier this year advised f/u in 3 mo. MRI suspicious for mets to liver -cea ordered - f/u gen surg recs, appears they have ordered IR biopsy   Hyponatremia:  Resolved w/ fluids  Hypokalemia Mild, 3.3, mg wnl - 60 meq oral ordered  Morbid obesity Body mass index is 50.49 kg/m.  -Encourage lifestyle change to lose weight.       DVT prophylaxis:    On subcu Lovenox  Code Status: Full code Family Communication: Patient and/or RN. Available if any question.  Level of care: Med-Surg Status is: Inpatient  Remains inpatient appropriate because:Ongoing diagnostic testing needed not appropriate for outpatient work up, IV treatments appropriate due to intensity of illness or inability to take PO, and Inpatient level of care  appropriate due to severity of illness  Dispo: The patient is from: Home              Anticipated d/c is to: Home              Patient currently is not medically stable to d/c.   Difficult to place patient No       Consultants:  General surgery   Sch Meds:  Scheduled Meds:  enoxaparin (LOVENOX) injection  60 mg Subcutaneous Q24H   Continuous Infusions:  sodium chloride 250 mL (10/11/20 0134)   dextrose 5% lactated ringers 125  mL/hr at 10/12/20 0948   PRN Meds:.sodium chloride, morphine injection, ondansetron **OR** ondansetron (ZOFRAN) IV  Antimicrobials: Anti-infectives (From admission, onward)    Start     Dose/Rate Route Frequency Ordered Stop   10/11/20 0100  cefTRIAXone (ROCEPHIN) 1 g in sodium chloride 0.9 % 100 mL IVPB  Status:  Discontinued        1 g 200 mL/hr over 30 Minutes Intravenous Every 24 hours 10/11/20 0003 10/11/20 1138        I have personally reviewed the following labs and images: CBC: Recent Labs  Lab 10/10/20 1511 10/11/20 0350  WBC 5.4 4.5  NEUTROABS 2.5  --   HGB 14.2 13.0  HCT 44.2 40.4  MCV 95.1 94.6  PLT 342 283   BMP &GFR Recent Labs  Lab 10/10/20 1511 10/11/20 0350 10/12/20 0344  NA 134* 139 139  K 3.6 3.7 3.3*  CL 98 104 106  CO2 25 24 27   GLUCOSE 95 106* 141*  BUN 12 10 7   CREATININE 0.89 0.60 0.76  CALCIUM 8.9 8.7* 8.7*  MG  --   --  1.9  PHOS  --   --  2.9   Estimated Creatinine Clearance: 105.6 mL/min (by C-G formula based on SCr of 0.76 mg/dL). Liver & Pancreas: Recent Labs  Lab 10/10/20 1511 10/11/20 0350 10/12/20 0344  AST 61* 44* 39  ALT 66* 58* 51*  ALKPHOS 72 63 64  BILITOT 0.8 0.9 0.5  PROT 7.4 6.4* 6.4*  ALBUMIN 3.5 3.1* 3.0*   Recent Labs  Lab 10/10/20 1511  LIPASE 23   No results for input(s): AMMONIA in the last 168 hours. Diabetic: No results for input(s): HGBA1C in the last 72 hours. No results for input(s): GLUCAP in the last 168 hours. Cardiac Enzymes: No results for input(s): CKTOTAL, CKMB, CKMBINDEX, TROPONINI in the last 168 hours. No results for input(s): PROBNP in the last 8760 hours. Coagulation Profile: No results for input(s): INR, PROTIME in the last 168 hours. Thyroid Function Tests: No results for input(s): TSH, T4TOTAL, FREET4, T3FREE, THYROIDAB in the last 72 hours. Lipid Profile: No results for input(s): CHOL, HDL, LDLCALC, TRIG, CHOLHDL, LDLDIRECT in the last 72 hours. Anemia Panel: No results  for input(s): VITAMINB12, FOLATE, FERRITIN, TIBC, IRON, RETICCTPCT in the last 72 hours. Urine analysis:    Component Value Date/Time   COLORURINE YELLOW 10/10/2020 1916   APPEARANCEUR CLEAR 10/10/2020 1916   LABSPEC <1.005 (L) 10/10/2020 1916   PHURINE 5.0 10/10/2020 1916   GLUCOSEU NEGATIVE 10/10/2020 1916   HGBUR SMALL (A) 10/10/2020 1916   BILIRUBINUR NEGATIVE 10/10/2020 1916   KETONESUR 40 (A) 10/10/2020 1916   PROTEINUR NEGATIVE 10/10/2020 1916   UROBILINOGEN 1.0 08/20/2012 1345   NITRITE POSITIVE (A) 10/10/2020 1916   LEUKOCYTESUR NEGATIVE 10/10/2020 1916   Sepsis Labs: Invalid input(s): PROCALCITONIN, [place]  Microbiology: Recent Results (from the past 240 hour(s))  Resp Panel by RT-PCR (Flu A&B, Covid) Nasopharyngeal Swab     Status: None   Collection Time: 10/10/20  5:18 PM   Specimen: Nasopharyngeal Swab; Nasopharyngeal(NP) swabs in vial transport medium  Result Value Ref Range Status   SARS Coronavirus 2 by RT PCR NEGATIVE NEGATIVE Final    Comment: (NOTE) SARS-CoV-2 target nucleic acids are NOT DETECTED.  The SARS-CoV-2 RNA is generally detectable in upper respiratory specimens during the acute phase of infection. The lowest concentration of SARS-CoV-2 viral copies this assay can detect is 138 copies/mL. A negative result does not preclude SARS-Cov-2 infection and should not be used as the sole basis for treatment or other patient management decisions. A negative result may occur with  improper specimen collection/handling, submission of specimen other than nasopharyngeal swab, presence of viral mutation(s) within the areas targeted by this assay, and inadequate number of viral copies(<138 copies/mL). A negative result must be combined with clinical observations, patient history, and epidemiological information. The expected result is Negative.  Fact Sheet for Patients:  EntrepreneurPulse.com.au  Fact Sheet for Healthcare Providers:   IncredibleEmployment.be  This test is no t yet approved or cleared by the [place] FDA and  has been authorized for detection and/or diagnosis of SARS-CoV-2 by FDA under an Emergency Use Authorization (EUA). This EUA will remain  in effect (meaning this test can be used) for the duration of the COVID-19 declaration under Section 564(b)(1) of the Act, 21 U.S.C.section 360bbb-3(b)(1), unless the authorization is terminated  or revoked sooner.       Influenza A by PCR NEGATIVE NEGATIVE Final   Influenza B by PCR NEGATIVE NEGATIVE Final    Comment: (NOTE) The Xpert Xpress SARS-CoV-2/FLU/RSV plus assay is intended as an aid in the diagnosis of influenza from Nasopharyngeal swab specimens and should not be used as a sole basis for treatment. Nasal washings and aspirates are unacceptable for Xpert Xpress SARS-CoV-2/FLU/RSV testing.  Fact Sheet for Patients: EntrepreneurPulse.com.au  Fact Sheet for Healthcare Providers: IncredibleEmployment.be  This test is not yet approved or cleared by the [place] FDA and has been authorized for detection and/or diagnosis of SARS-CoV-2 by FDA under an Emergency Use Authorization (EUA). This EUA will remain in effect (meaning this test can be used) for the duration of the COVID-19 declaration under Section 564(b)(1) of the Act, 21 U.S.C. section 360bbb-3(b)(1), unless the authorization is terminated or revoked.  Performed at [facility], [address] [place], [place] [address]     Radiology Studies: No results found.     [name], MD [facility]  If 7PM-7AM, please contact night-coverage [email or web address] 10/12/2020, 12:58 PM

## 2020-10-12 NOTE — Progress Notes (Signed)
Chief Complaint: Patient was seen in consultation today for liver lesion biopsy  Referring Physician(s): Alferd Apa PA-C  Supervising Physician: Markus Daft  Patient Status: [facility] - In-pt  History of Present Illness: Lauren Mcpherson is a 55 y.o. female being worked up for abd pain. Her imaging finds evidence of terminal ileum thickening/mass resulting in at least partial bowel obstruction. She is also noted to have liver lesions. IR is asked to review for possible liver lesion biopsy Chart, imaging reviewed. Noted plans for endoscopy tomorrow. Pt trying to slowly drink bowel prep.   History reviewed. No pertinent past medical history.  Past Surgical History:  Procedure Laterality Date   CHOLECYSTECTOMY     TONSILLECTOMY      Allergies: Patient has no known allergies.  Medications:  Current Facility-Administered Medications:    0.9 %  sodium chloride infusion, , Intravenous, PRN, Elwyn Reach, MD, Last Rate: 10 mL/hr at 10/11/20 0134, 250 mL at 10/11/20 0134   enoxaparin (LOVENOX) injection 60 mg, 60 mg, Subcutaneous, Q24H, Jonelle Sidle, Mohammad L, MD, 60 mg at 10/11/20 2145   morphine 2 MG/ML injection 2 mg, 2 mg, Intravenous, Q2H PRN, Jonelle Sidle, Mohammad L, MD   ondansetron (ZOFRAN) tablet 4 mg, 4 mg, Oral, Q6H PRN **OR** ondansetron (ZOFRAN) injection 4 mg, 4 mg, Intravenous, Q6H PRN, Garba, Mohammad L, MD   potassium chloride SA (KLOR-CON) CR tablet 60 mEq, 60 mEq, Oral, Once, Wouk, Ailene Rud, MD]    History reviewed. No pertinent family history.  Social History   Socioeconomic History   Marital status: Married    Spouse name: Not on file   Number of children: Not on file   Years of education: Not on file   Highest education level: Not on file  Occupational History   Not on file  Tobacco Use   Smoking status: Former    Pack years: 0.00   Smokeless tobacco: Not on file  Vaping Use   Vaping Use: Never used  Substance and Sexual Activity   Alcohol use:  Yes    Comment: occ   Drug use: No   Sexual activity: Yes    Birth control/protection: None  Other Topics Concern   Not on file  Social History Narrative   Not on file   Social Determinants of Health   Financial Resource Strain: Not on file  Food Insecurity: Not on file  Transportation Needs: Not on file  Physical Activity: Not on file  Stress: Not on file  Social Connections: Not on file    Review of Systems: A 12 point ROS discussed and pertinent positives are indicated in the HPI above.  All other systems are negative.  Review of Systems  Vital Signs: BP 123/80 (BP Location: Right Arm)   Pulse 71   Temp 98.5 F (36.9 C) (Oral)   Resp 18   Ht 5\' 3"  (1.6 m)   Wt 129.3 kg   SpO2 99%   BMI 50.49 kg/m   Physical Exam Constitutional:      Appearance: Normal appearance. She is not ill-appearing.  HENT:     Mouth/Throat:     Mouth: Mucous membranes are moist.     Pharynx: Oropharynx is clear.  Cardiovascular:     Rate and Rhythm: Normal rate and regular rhythm.     Heart sounds: Normal heart sounds.  Pulmonary:     Effort: Pulmonary effort is normal. No respiratory distress.     Breath sounds: Normal breath sounds.  Abdominal:  General: Abdomen is flat. There is no distension.     Palpations: Abdomen is soft. There is no mass.     Tenderness: There is no abdominal tenderness. There is no guarding.  Skin:    General: Skin is warm and dry.  Neurological:     General: No focal deficit present.     Mental Status: She is alert and oriented to person, place, and time.  Psychiatric:        Mood and Affect: Mood normal.        Thought Content: Thought content normal.        Judgment: Judgment normal.    Imaging: MR ABDOMEN W WO CONTRAST  Result Date: 10/11/2020 CLINICAL DATA:  55 year old female with history of abdominal pain. Indeterminate liver lesions noted on recent CT examination. Evaluate for potential metastatic disease. EXAM: MRI ABDOMEN WITHOUT AND  WITH CONTRAST TECHNIQUE: Multiplanar multisequence MR imaging of the abdomen was performed both before and after the administration of intravenous contrast. CONTRAST:  43mL GADAVIST GADOBUTROL 1 MMOL/ML IV SOLN COMPARISON:  No prior abdominal MRI. CT the abdomen and pelvis 10/10/2020. FINDINGS: Lower chest: Unremarkable. Hepatobiliary: There are multiple lesions scattered throughout the liver which are mildly T1 hypointense, slightly T2 hyperintense and demonstrate a hypovascular appearance on post gadolinium images concerning for metastatic lesions. Specific examples include a 2.1 x 1.4 cm lesion in the periphery of the liver between segments 4A and 8 (axial image 31 of series 22), a 1.8 x 1.6 cm lesion in the caudate lobe (axial image 45 of series 2), and a 1.5 x 1.3 cm lesion in segment 7 (axial image 44 of series 22). There is also a small 7 mm lesion between segments 7 and 8 (axial image 43 of series 22). No intra or extrahepatic biliary ductal dilatation. Status post cholecystectomy. Pancreas: No pancreatic mass. No pancreatic ductal dilatation. No pancreatic or peripancreatic fluid collections or inflammatory changes. Spleen:  Unremarkable. Adrenals/Urinary Tract: 1.3 x 0.7 cm T1 hypointense, T2 hyperintense, nonenhancing lesion in the upper pole of the right kidney, compatible with a simple cyst. No aggressive appearing renal lesions. No hydroureteronephrosis in the visualized portions of the abdomen. Bilateral adrenal glands are normal in appearance. Stomach/Bowel: Several dilated loops of small bowel measuring up to 4.1 cm in diameter with multiple air-fluid levels. Terminal ileum is partially imaged on several pulse sequences, and appears thickened with increased mural enhancement. Vascular/Lymphatic: No aneurysm identified in the visualized abdominal vasculature. Several prominent but nonenlarged ileocolic lymph nodes are noted. No other definite enlarged lymph nodes are noted elsewhere in the visualized  portions of the abdomen. Other: No significant volume of ascites noted in the visualized portions of the peritoneal cavity. Musculoskeletal: No aggressive appearing osseous lesions are noted in the visualized portions of the skeleton. IMPRESSION: 1. Multiple liver lesions concerning for metastatic disease, as detailed above. 2. Partially imaged terminal ileal thickening with increased mural enhancement and numerous prominent but nonenlarged adjacent ileocolic lymph nodes. This could represent inflammatory bowel disease such as Crohn's ileitis, however, the possibility of a malignant lesion warrants consideration, particularly in light of the liver lesions. This appears associated with small bowel obstruction, similar to recent CT examination. Electronically Signed   By: Vinnie Langton M.D.   On: 10/11/2020 12:03   CT Abdomen Pelvis W Contrast  Result Date: 10/10/2020 CLINICAL DATA:  Suspected bowel obstruction. EXAM: CT ABDOMEN AND PELVIS WITH CONTRAST TECHNIQUE: Multidetector CT imaging of the abdomen and pelvis was performed using the standard protocol  following bolus administration of intravenous contrast. CONTRAST:  175mL OMNIPAQUE IOHEXOL 300 MG/ML  SOLN COMPARISON:  None. FINDINGS: Lower chest: No acute abnormality. Hepatobiliary: Several ill-defined hypoattenuated masses throughout the liver. Status post cholecystectomy. No biliary dilatation. Pancreas: Unremarkable. No pancreatic ductal dilatation or surrounding inflammatory changes. Spleen: Normal in size without focal abnormality. Adrenals/Urinary Tract: Adrenal glands are unremarkable. Kidneys are without renal calculi, focal lesion, or hydronephrosis. Few mm probable right renal cyst. Bladder is unremarkable. Stomach/Bowel: 1.5 cm gastric diverticulum off of the posterior gastric body. There is diffuse distal small bowel dilation to the level of the terminal ileum. The appendix is normal. The colon is decompressed. Nonspecific thickening of the  terminal ileum. Scattered colonic diverticulosis. Vascular/Lymphatic: No significant vascular findings are present. No enlarged abdominal or pelvic lymph nodes. Multiple shotty nonspecific central mesenteric lymph nodes. Reproductive: No acute findings. Other: No abdominal wall hernia or abnormality. No abdominopelvic ascites. Musculoskeletal: Bilateral L5-S1 pars articularis defects. No alignment abnormalities. Spondylosis of the lower lumbosacral spine. IMPRESSION: 1. Several ill-defined hypoattenuated masses throughout the liver, indeterminate. Metastatic disease is in the differential diagnosis. Further evaluation with contrast-enhanced abdominal MRI may be considered. 2. Small bowel obstruction to the level of the terminal ileum. Thickening of the terminal ileum, soft tissue mass cannot be excluded. 3. Scattered colonic diverticulosis without evidence of acute diverticulitis. 4. Bilateral L5-S1 pars articularis defects. No alignment abnormalities. Electronically Signed   By: Fidela Salisbury M.D.   On: 10/10/2020 16:53    Labs:  CBC: Recent Labs    10/10/20 1511 10/11/20 0350  WBC 5.4 4.5  HGB 14.2 13.0  HCT 44.2 40.4  PLT 342 283    COAGS: No results for input(s): INR, APTT in the last 8760 hours.  BMP: Recent Labs    10/10/20 1511 10/11/20 0350 10/12/20 0344  NA 134* 139 139  K 3.6 3.7 3.3*  CL 98 104 106  CO2 25 24 27   GLUCOSE 95 106* 141*  BUN 12 10 7   CALCIUM 8.9 8.7* 8.7*  CREATININE 0.89 0.60 0.76  GFRNONAA >60 >60 >60    LIVER FUNCTION TESTS: Recent Labs    10/10/20 1511 10/11/20 0350 10/12/20 0344  BILITOT 0.8 0.9 0.5  AST 61* 44* 39  ALT 66* 58* 51*  ALKPHOS 72 63 64  PROT 7.4 6.4* 6.4*  ALBUMIN 3.5 3.1* 3.0*    TUMOR MARKERS: No results for input(s): AFPTM, CEA, CA199, CHROMGRNA in the last 8760 hours.  Assessment and Plan: Liver lesions in setting of possible small bowel mass. Imaging reviewed, can attempt US guided biopsy of liver  lesion. Labs reviewed. NPO p MN Holding tonight's Lovenox Risks and benefits of 20 was discussed with the patient and/or patient's family including, but not limited to bleeding, infection, damage to adjacent structures or low yield requiring additional tests.  All of the questions were answered and there is agreement to proceed.  Consent signed and in chart.   Thank you for this interesting consult.  I greatly enjoyed meeting Lauren Mcpherson and look forward to participating in their care.  A copy of this report was sent to the requesting provider on this date.  Electronically Signed: Ascencion Dike, PA-C 10/12/2020, 2:35 PM   I spent a total of 20 minutes in face to face in clinical consultation, greater than 50% of which was counseling/coordinating care for liver lesion bx

## 2020-10-12 NOTE — Progress Notes (Addendum)
Subjective: CC: Patient reports some nausea and vomiting following prep yesterday.  Last night her nausea resolved and she has none this morning.  Currently tolerating ginger ale without any abdominal pain, nausea or vomiting.  She is passing flatus.  Liquid BM yesterday.  Noted mammography/ultrasound findings in care everywhere of the right breast.  She reports she has not felt any lumps or abnormalities.  GI is planning for colonoscopy tomorrow.  Objective: Vital signs in last 24 hours: Temp:  [97.9 F (36.6 C)-98.8 F (37.1 C)] 98 F (36.7 C) (06/21 6295) Pulse Rate:  [67-91] 91 (06/21 0614) Resp:  [16-24] 24 (06/21 0614) BP: (102-152)/(78-85) 102/78 (06/21 0614) SpO2:  [96 %-100 %] 97 % (06/21 0614) Last BM Date: 10/11/20  Intake/Output from previous day: 06/20 0701 - 06/21 0700 In: 3077.8 [P.O.:360; I.V.:2717.8] Out: 1 [Emesis/NG output:1] Intake/Output this shift: No intake/output data recorded.  PE: Gen:  Alert, NAD, pleasant HEENT: EOM's intact, pupils equal and round Card:  RRR, no M/G/R heard Pulm:  CTAB, no W/R/R, effort normal Breast: Chaperone x 2 present. R breast with normal-appearing areolar tissue.  No nipple inversion. Skin appears normal. No palpable lumps.  No palpable lymph nodes in axilla. L breast with normal-appearing areolar tissue.  No nipple inversion.  No palpable lumps.  No palpable lymph nodes in axilla. Abd: Soft, NT/ND, +BS Ext:  No LE edema  Psych: A&Ox3  Skin: no rashes noted, warm and dry   Lab Results:  Recent Labs    10/10/20 1511 10/11/20 0350  WBC 5.4 4.5  HGB 14.2 13.0  HCT 44.2 40.4  PLT 342 283   BMET Recent Labs    10/11/20 0350 10/12/20 0344  NA 139 139  K 3.7 3.3*  CL 104 106  CO2 24 27  GLUCOSE 106* 141*  BUN 10 7  CREATININE 0.60 0.76  CALCIUM 8.7* 8.7*   PT/INR No results for input(s): LABPROT, INR in the last 72 hours. CMP     Component Value Date/Time   NA 139 10/12/2020 0344   K 3.3 (L)  10/12/2020 0344   CL 106 10/12/2020 0344   CO2 27 10/12/2020 0344   GLUCOSE 141 (H) 10/12/2020 0344   BUN 7 10/12/2020 0344   CREATININE 0.76 10/12/2020 0344   CALCIUM 8.7 (L) 10/12/2020 0344   PROT 6.4 (L) 10/12/2020 0344   ALBUMIN 3.0 (L) 10/12/2020 0344   AST 39 10/12/2020 0344   ALT 51 (H) 10/12/2020 0344   ALKPHOS 64 10/12/2020 0344   BILITOT 0.5 10/12/2020 0344   GFRNONAA >60 10/12/2020 0344   Lipase     Component Value Date/Time   LIPASE 23 10/10/2020 1511       Studies/Results: MR ABDOMEN W WO CONTRAST  Result Date: 10/11/2020 CLINICAL DATA:  55 year old female with history of abdominal pain. Indeterminate liver lesions noted on recent CT examination. Evaluate for potential metastatic disease. EXAM: MRI ABDOMEN WITHOUT AND WITH CONTRAST TECHNIQUE: Multiplanar multisequence MR imaging of the abdomen was performed both before and after the administration of intravenous contrast. CONTRAST:  65mL GADAVIST GADOBUTROL 1 MMOL/ML IV SOLN COMPARISON:  No prior abdominal MRI. CT the abdomen and pelvis 10/10/2020. FINDINGS: Lower chest: Unremarkable. Hepatobiliary: There are multiple lesions scattered throughout the liver which are mildly T1 hypointense, slightly T2 hyperintense and demonstrate a hypovascular appearance on post gadolinium images concerning for metastatic lesions. Specific examples include a 2.1 x 1.4 cm lesion in the periphery of the liver between segments 4A  and 8 (axial image 31 of series 22), a 1.8 x 1.6 cm lesion in the caudate lobe (axial image 45 of series 2), and a 1.5 x 1.3 cm lesion in segment 7 (axial image 44 of series 22). There is also a small 7 mm lesion between segments 7 and 8 (axial image 43 of series 22). No intra or extrahepatic biliary ductal dilatation. Status post cholecystectomy. Pancreas: No pancreatic mass. No pancreatic ductal dilatation. No pancreatic or peripancreatic fluid collections or inflammatory changes. Spleen:  Unremarkable.  Adrenals/Urinary Tract: 1.3 x 0.7 cm T1 hypointense, T2 hyperintense, nonenhancing lesion in the upper pole of the right kidney, compatible with a simple cyst. No aggressive appearing renal lesions. No hydroureteronephrosis in the visualized portions of the abdomen. Bilateral adrenal glands are normal in appearance. Stomach/Bowel: Several dilated loops of small bowel measuring up to 4.1 cm in diameter with multiple air-fluid levels. Terminal ileum is partially imaged on several pulse sequences, and appears thickened with increased mural enhancement. Vascular/Lymphatic: No aneurysm identified in the visualized abdominal vasculature. Several prominent but nonenlarged ileocolic lymph nodes are noted. No other definite enlarged lymph nodes are noted elsewhere in the visualized portions of the abdomen. Other: No significant volume of ascites noted in the visualized portions of the peritoneal cavity. Musculoskeletal: No aggressive appearing osseous lesions are noted in the visualized portions of the skeleton. IMPRESSION: 1. Multiple liver lesions concerning for metastatic disease, as detailed above. 2. Partially imaged terminal ileal thickening with increased mural enhancement and numerous prominent but nonenlarged adjacent ileocolic lymph nodes. This could represent inflammatory bowel disease such as Crohn's ileitis, however, the possibility of a malignant lesion warrants consideration, particularly in light of the liver lesions. This appears associated with small bowel obstruction, similar to recent CT examination. Electronically Signed   By: Vinnie Langton M.D.   On: 10/11/2020 12:03   CT Abdomen Pelvis W Contrast  Result Date: 10/10/2020 CLINICAL DATA:  Suspected bowel obstruction. EXAM: CT ABDOMEN AND PELVIS WITH CONTRAST TECHNIQUE: Multidetector CT imaging of the abdomen and pelvis was performed using the standard protocol following bolus administration of intravenous contrast. CONTRAST:  16mL OMNIPAQUE  IOHEXOL 300 MG/ML  SOLN COMPARISON:  None. FINDINGS: Lower chest: No acute abnormality. Hepatobiliary: Several ill-defined hypoattenuated masses throughout the liver. Status post cholecystectomy. No biliary dilatation. Pancreas: Unremarkable. No pancreatic ductal dilatation or surrounding inflammatory changes. Spleen: Normal in size without focal abnormality. Adrenals/Urinary Tract: Adrenal glands are unremarkable. Kidneys are without renal calculi, focal lesion, or hydronephrosis. Few mm probable right renal cyst. Bladder is unremarkable. Stomach/Bowel: 1.5 cm gastric diverticulum off of the posterior gastric body. There is diffuse distal small bowel dilation to the level of the terminal ileum. The appendix is normal. The colon is decompressed. Nonspecific thickening of the terminal ileum. Scattered colonic diverticulosis. Vascular/Lymphatic: No significant vascular findings are present. No enlarged abdominal or pelvic lymph nodes. Multiple shotty nonspecific central mesenteric lymph nodes. Reproductive: No acute findings. Other: No abdominal wall hernia or abnormality. No abdominopelvic ascites. Musculoskeletal: Bilateral L5-S1 pars articularis defects. No alignment abnormalities. Spondylosis of the lower lumbosacral spine. IMPRESSION: 1. Several ill-defined hypoattenuated masses throughout the liver, indeterminate. Metastatic disease is in the differential diagnosis. Further evaluation with contrast-enhanced abdominal MRI may be considered. 2. Small bowel obstruction to the level of the terminal ileum. Thickening of the terminal ileum, soft tissue mass cannot be excluded. 3. Scattered colonic diverticulosis without evidence of acute diverticulitis. 4. Bilateral L5-S1 pars articularis defects. No alignment abnormalities. Electronically Signed   By: Thomas Hoff  Dimitrova M.D.   On: 10/10/2020 16:53    Anti-infectives: Anti-infectives (From admission, onward)    Start     Dose/Rate Route Frequency Ordered Stop    10/11/20 0100  cefTRIAXone (ROCEPHIN) 1 g in sodium chloride 0.9 % 100 mL IVPB  Status:  Discontinued        1 g 200 mL/hr over 30 Minutes Intravenous Every 24 hours 10/11/20 0003 10/11/20 1138        Assessment/Plan Small bowel obstruction to level of terminal ileum Thickening of the terminal ileum Liver lesions - CT w/ CT scan which shows SBO to the  level of the terminal ileum; thickening of the terminal ileum, soft tissue mass cannot be excluded; several ill-defined hypoattenuated masses throughout the liver, indeterminate. - MRI Multiple liver lesions concerning for metastatic disease as well as partially imaged terminal ileal thickening with increased mural enhancement and numerous prominent but nonenlarged adjacent ileocolic lymph nodes - SBO possibly secondary to soft tissue mass at terminal ileum - Agree with plan for colonoscopy by GI. She is undergoing prep - Will ask IR to Bx liver - To note, patient had abnormal mammogram and ultrasound of the right breast in April/May. There is no palpable masses/lumps or lymph nodes on exam. This may need additional imaging. Results as below.  - We will follow with you.   Mammogram 08/04/20: Further evaluation is suggested for a possible mass in the right breast.  Diagnostic Mammogram/R breast US 08/26/20: Intramammary lymph node (0.7 x 1 x 1.1 cm) over the 10 o'clock position of the right breast 12 cm from the nipple accounting for the mammographic finding with borderline cortical thickening as this likely represents a benign reactive lymph node. Ultrasound the right axilla demonstrates a few similar appearing lymph nodes with cortical thickness between 3-4 mm   ID - none currently VTE - SCDs, lovenox FEN - IVF, CLD Foley - none Follow up - TBD   Obesity BMI 50.49   LOS: 2 days    Jillyn Ledger , [facility] 10/12/2020, 9:00 AM Please see Amion for pager number during day hours 7:00am-4:30pm

## 2020-10-13 ENCOUNTER — Encounter (HOSPITAL_COMMUNITY): Payer: Self-pay | Admitting: Family Medicine

## 2020-10-13 ENCOUNTER — Inpatient Hospital Stay (HOSPITAL_COMMUNITY): Payer: 59

## 2020-10-13 ENCOUNTER — Inpatient Hospital Stay (HOSPITAL_COMMUNITY): Payer: 59 | Admitting: Anesthesiology

## 2020-10-13 ENCOUNTER — Encounter (HOSPITAL_COMMUNITY)
Admission: EM | Disposition: A | Discharge status: Home / Self Care | Payer: Self-pay | Source: Home / Self Care | Attending: Internal Medicine

## 2020-10-13 HISTORY — PX: POLYPECTOMY: SHX5525

## 2020-10-13 HISTORY — PX: BIOPSY: SHX5522

## 2020-10-13 HISTORY — PX: COLONOSCOPY WITH PROPOFOL: SHX5780

## 2020-10-13 LAB — BASIC METABOLIC PANEL
Anion gap: 6 (ref 5–15)
BUN: <5 mg/dL — ABNORMAL LOW (ref 6–20)
CO2: 27 mmol/L (ref 22–32)
Calcium: 9 mg/dL (ref 8.9–10.3)
Chloride: 107 mmol/L (ref 98–111)
Creatinine, Ser: 0.84 mg/dL (ref 0.44–1.00)
GFR, Estimated: >60 mL/min (ref >60)
Glucose, Bld: 115 mg/dL — ABNORMAL HIGH (ref 70–99)
Potassium: 4 mmol/L (ref 3.5–5.1)
Sodium: 140 mmol/L (ref 135–145)

## 2020-10-13 LAB — PROTIME-INR
INR: 1 (ref 0.8–1.2)
Prothrombin Time: 13.5 seconds (ref 11.4–15.2)

## 2020-10-13 SURGERY — COLONOSCOPY WITH PROPOFOL
Anesthesia: Monitor Anesthesia Care

## 2020-10-13 MED ORDER — MIDAZOLAM HCL 2 MG/2ML IJ SOLN
INTRAMUSCULAR | Status: AC
  Filled 2020-10-13: qty 4

## 2020-10-13 MED ORDER — SODIUM CHLORIDE 0.9 % IV SOLN: INTRAVENOUS | Status: DC

## 2020-10-13 MED ORDER — LIDOCAINE 2% (20 MG/ML) 5 ML SYRINGE
INTRAMUSCULAR | Status: DC | PRN
  Administered 2020-10-13: 80 mg via INTRAVENOUS

## 2020-10-13 MED ORDER — LACTATED RINGERS IV SOLN: INTRAVENOUS | Status: DC

## 2020-10-13 MED ORDER — PROPOFOL 10 MG/ML IV BOLUS
INTRAVENOUS | Status: DC | PRN
  Administered 2020-10-13: 20 mg via INTRAVENOUS
  Administered 2020-10-13: 30 mg via INTRAVENOUS

## 2020-10-13 MED ORDER — LIDOCAINE HCL 1 % IJ SOLN
INTRAMUSCULAR | Status: AC
  Filled 2020-10-13: qty 20

## 2020-10-13 MED ORDER — PROPOFOL 500 MG/50ML IV EMUL
INTRAVENOUS | Status: DC | PRN
  Administered 2020-10-13: 120 ug/kg/min via INTRAVENOUS

## 2020-10-13 MED ORDER — FENTANYL CITRATE (PF) 100 MCG/2ML IJ SOLN
INTRAMUSCULAR | Status: AC
  Filled 2020-10-13: qty 2

## 2020-10-13 MED ORDER — PHENYLEPHRINE 40 MCG/ML (10ML) SYRINGE FOR IV PUSH (FOR BLOOD PRESSURE SUPPORT)
PREFILLED_SYRINGE | INTRAVENOUS | Status: DC | PRN
  Administered 2020-10-13 (×2): 80 ug via INTRAVENOUS

## 2020-10-13 MED ORDER — LACTATED RINGERS IV SOLN: INTRAVENOUS | Status: AC

## 2020-10-13 MED ORDER — GELATIN ABSORBABLE 12-7 MM EX MISC
CUTANEOUS | Status: AC
  Filled 2020-10-13: qty 1

## 2020-10-13 SURGICAL SUPPLY — 21 items

## 2020-10-13 NOTE — Anesthesia Preprocedure Evaluation (Addendum)
Anesthesia Evaluation  Patient identified by MRN, date of birth, ID band Patient awake    Reviewed: Allergy & Precautions, NPO status , Patient's Chart, lab work & pertinent test results  History of Anesthesia Complications Negative for: history of anesthetic complications  Airway Mallampati: I  TM Distance: >3 FB Neck ROM: Full    Dental  (+) Dental Advisory Given, Partial Lower, Partial Upper   Pulmonary former smoker,    Pulmonary exam normal        Cardiovascular negative cardio ROS Normal cardiovascular exam     Neuro/Psych negative neurological ROS  negative psych ROS   GI/Hepatic  Liver masses on CT   SB masses on CT    Endo/Other  Morbid obesity  Renal/GU negative Renal ROS     Musculoskeletal negative musculoskeletal ROS (+)   Abdominal (+) + obese,   Peds  Hematology negative hematology ROS (+)   Anesthesia Other Findings   Reproductive/Obstetrics                            Anesthesia Physical Anesthesia Plan  ASA: 3  Anesthesia Plan: MAC   Post-op Pain Management:    Induction: Intravenous  PONV Risk Score and Plan: 2 and Propofol infusion and Treatment may vary due to age or medical condition  Airway Management Planned: Nasal Cannula and Natural Airway  Additional Equipment: None  Intra-op Plan:   Post-operative Plan:   Informed Consent: I have reviewed the patients History and Physical, chart, labs and discussed the procedure including the risks, benefits and alternatives for the proposed anesthesia with the patient or authorized representative who has indicated his/her understanding and acceptance.       Plan Discussed with: CRNA and Anesthesiologist  Anesthesia Plan Comments:        Anesthesia Quick Evaluation

## 2020-10-13 NOTE — Op Note (Signed)
[facility] Patient Name: Lauren Mcpherson Procedure Date: 10/13/2020 MRN: [id number] Attending MD: Ronald Lobo , MD Date of Birth: 1965/12/20 CSN: [id number] Age: 55 Admit Type: Inpatient Procedure:                Colonoscopy Indications:              Abnormal CT of the GI tract--CT shows apparent                            partial small bowel excision Providers:                Ronald Lobo, MD, Clyde Lundborg, RN, Ladona Ridgel, Technician, Caryl Pina CRNA Referring MD:              Medicines:                Monitored Anesthesia Care Complications:            No immediate complications. Estimated Blood Loss:     Estimated blood loss was minimal. Procedure:                Pre-Anesthesia Assessment:                           - Prior to the procedure, a History and Physical                            was performed, and patient medications and                            allergies were reviewed. The patient's tolerance of                            previous anesthesia was also reviewed. The risks                            and benefits of the procedure and the sedation                            options and risks were discussed with the patient.                            All questions were answered, and informed consent                            was obtained. Prior Anticoagulants: The patient has                            taken no previous anticoagulant or antiplatelet                            agents. ASA Grade Assessment: III - A patient with  severe systemic disease. After reviewing the risks                            and benefits, the patient was deemed in                            satisfactory condition to undergo the procedure.                           After obtaining informed consent, the colonoscope                            was passed under direct vision. Throughout the                             procedure, the patient's blood pressure, pulse, and                            oxygen saturations were monitored continuously. The                            [id number]-HQ190L ([id number] Olympus colonoscope was                            introduced through the anus and advanced to the the                            cecum, identified by appendiceal orifice and                            ileocecal valve. The colonoscopy was performed with                            ease. The patient tolerated the procedure well. The                            quality of the bowel preparation was excellent. Scope In: 12:39:14 PM Scope Out: 1:04:47 PM Scope Withdrawal Time: 0 hours 21 minutes 41 seconds  Total Procedure Duration: 0 hours 25 minutes 33 seconds  Findings:      The perianal and digital rectal examinations were normal.      A frond-like/villous medium-sized mass (perhaps 5-7 cm across) was found       in the cecum. It distorted anatomy and I was not able to enter the       terminal ileum although it did not obviously involve the ileo-cecal       valve. The mass was non-circumferential. No bleeding was present       although the lesion was friable. Biopsies were taken with a cold forceps       for histology. Estimated blood loss was minimal.      A 7 mm polyp was found in the transverse colon. The polyp was       semi-sessile. The polyp was removed with a cold snare. Resection and       retrieval were complete. Estimated blood loss was minimal.  A 9 mm polyp was found in the splenic flexure. The polyp was       semi-pedunculated. The polyp was removed with a cold snare. Resection       and retrieval were complete. Estimated blood loss was minimal.      Multiple medium-mouthed diverticula were found in the entire colon.      The retroflexed view of the distal rectum and anal verge was normal and       showed no anal or rectal abnormalities. Impression:               - Rule out malignancy, tumor in the  cecum. Biopsied.                           - One 7 mm polyp in the transverse colon, removed                            with a cold snare. Resected and retrieved.                           - One 9 mm polyp at the splenic flexure, removed                            with a cold snare. Resected and retrieved.                           - Diverticulosis in the entire examined colon.                           - The distal rectum and anal verge are normal on                            retroflexion view. Moderate Sedation:      This patient was sedated with monitored anesthesia care, not moderate       sedation. Recommendation:           - Await pathology results. Procedure Code(s):        --- Professional ---                           [phone], Colonoscopy, flexible; with removal of                            tumor(s), polyp(s), or other lesion(s) by snare                            technique                           [id number], 66, Colonoscopy, flexible; with biopsy,                            single or multiple Diagnosis Code(s):        --- Professional ---                           D49.0, Neoplasm of unspecified  behavior of                            digestive system                           K63.5, Polyp of colon                           R93.3, Abnormal findings on diagnostic imaging of                            other parts of digestive tract CPT copyright 2019 [employer]. All rights reserved. The codes documented in this report are preliminary and upon coder review may  be revised to meet current compliance requirements. Ronald Lobo, MD 10/13/2020 1:24:09 PM This report has been signed electronically. Number of Addenda: 0

## 2020-10-13 NOTE — Progress Notes (Signed)
PROGRESS NOTE  Lauren Mcpherson XBJ:[id number] DOB: August 17, 1965   PCP: Pcp, No  Patient is from: Home.  DOA: 10/10/2020 LOS: 3  Chief complaints: Nausea and vomiting  Brief Narrative / Interim history: 55 year old F with PMH of morbid obesity and recent cholecystectomy presenting with nausea, vomiting and abdominal pain that have started the morning of presentation, and admitted for possible small bowel obstruction.  Blood work with mild transaminitis.  CT abdomen and pelvis showed several ill-defined hypoattenuated masses throughout the liver and SBO to the level of terminal ileum with thickened terminal ileum.  She did not require NG tube.  MRI ordered.  Patient was admitted and general surgery consulted.  Subjective: Seen and examined earlier this morning.   She is npo, awaiting for endoscopy, she denies ab pain, no n/v Husband at bedside   Objective: Vitals:   10/12/20 1207 10/12/20 1337 10/12/20 2046 10/13/20 0541  BP: 118/70 123/80 (!) 154/86 119/76  Pulse: 81 71 83 82  Resp: 18 18 17 16   Temp: 98.5 F (36.9 C)  97.9 F (36.6 C) 98 F (36.7 C)  TempSrc: Oral  Oral Oral  SpO2: 97% 99% 97% 93%  Weight:      Height:       No intake or output data in the 24 hours ending 10/13/20 1041  Filed Weights   10/10/20 1458  Weight: 129.3 kg    Examination:  GENERAL: No apparent distress.  Nontoxic. HEENT: MMM.  Vision and hearing grossly intact.  NECK: Supple.  No apparent JVD.  RESP: On RA.  No IWOB.  Fair aeration bilaterally. CVS:  RRR. Heart sounds normal.  ABD/GI/GU: BS+. Abd soft, NTND.  MSK/EXT:  Moves extremities. No apparent deformity. No edema.  SKIN: no apparent skin lesion or wound NEURO: Awake, alert and oriented appropriately.  No apparent focal neuro deficit. PSYCH: Calm. Normal affect.   Procedures:  None  Microbiology summarized: [id number] and influenza PCR nonreactive.  Assessment & Plan:  Small bowel obstruction, partial:  - presented with nausea  vomiting abdominal pain  -Currently n.p.o., on IV hydration -General surgery and GI following - plan for colonoscopy today -Diet advancement per general surgery and GI  Liver masses:  CT abdomen and pelvis showed several ill-defined hypoattenuated masses throughout the liver.  She also had thickened distal ileum.  MRI suspicious for mets to liver No constitutional symptoms.  No personal or family history of cancer or GI disease.  Birads 3 mammo earlier this year advised f/u in 3 mo.  -cea 37.7 -Colonoscopy today -IR also consulted for possible liver biopsy - f/u gen surg recs,  IR and GI recommendation    Hyponatremia:  Resolved w/ fluids  Hypokalemia Mild, 3.3, mg wnl -Replaced, normalized  Morbid obesity Body mass index is 50.49 kg/m.  -Encourage lifestyle change to lose weight.       DVT prophylaxis:    On subcu Lovenox  Code Status: Full code Family Communication: Husband at bedside Level of care: Med-Surg Status is: Inpatient  Remains inpatient appropriate because:Ongoing diagnostic testing needed not appropriate for outpatient work up, IV treatments appropriate due to intensity of illness or inability to take PO, and Inpatient level of care appropriate due to severity of illness  Dispo: The patient is from: Home              Anticipated d/c is to: Home              Patient currently is not medically stable to d/c.,  Difficult to place patient No       Consultants:  General surgery GI IR   Sch Meds:  Scheduled Meds:  enoxaparin (LOVENOX) injection  60 mg Subcutaneous Q24H   Continuous Infusions:  sodium chloride 250 mL (10/11/20 0134)   PRN Meds:.sodium chloride, morphine injection, ondansetron **OR** ondansetron (ZOFRAN) IV  Antimicrobials: Anti-infectives (From admission, onward)    Start     Dose/Rate Route Frequency Ordered Stop   10/11/20 0100  cefTRIAXone (ROCEPHIN) 1 g in sodium chloride 0.9 % 100 mL IVPB  Status:  Discontinued         1 g 200 mL/hr over 30 Minutes Intravenous Every 24 hours 10/11/20 0003 10/11/20 1138        I have personally reviewed the following labs and images: CBC: Recent Labs  Lab 10/10/20 1511 10/11/20 0350  WBC 5.4 4.5  NEUTROABS 2.5  --   HGB 14.2 13.0  HCT 44.2 40.4  MCV 95.1 94.6  PLT 342 283   BMP &GFR Recent Labs  Lab 10/10/20 1511 10/11/20 0350 10/12/20 0344 10/13/20 0312  NA 134* 139 139 140  K 3.6 3.7 3.3* 4.0  CL 98 104 106 107  CO2 25 24 27 27   GLUCOSE 95 106* 141* 115*  BUN 12 10 7  <5*  CREATININE 0.89 0.60 0.76 0.84  CALCIUM 8.9 8.7* 8.7* 9.0  MG  --   --  1.9  --   PHOS  --   --  2.9  --    Estimated Creatinine Clearance: 100.6 mL/min (by C-G formula based on SCr of 0.84 mg/dL). Liver & Pancreas: Recent Labs  Lab 10/10/20 1511 10/11/20 0350 10/12/20 0344  AST 61* 44* 39  ALT 66* 58* 51*  ALKPHOS 72 63 64  BILITOT 0.8 0.9 0.5  PROT 7.4 6.4* 6.4*  ALBUMIN 3.5 3.1* 3.0*   Recent Labs  Lab 10/10/20 1511  LIPASE 23   No results for input(s): AMMONIA in the last 168 hours. Diabetic: No results for input(s): HGBA1C in the last 72 hours. No results for input(s): GLUCAP in the last 168 hours. Cardiac Enzymes: No results for input(s): CKTOTAL, CKMB, CKMBINDEX, TROPONINI in the last 168 hours. No results for input(s): PROBNP in the last 8760 hours. Coagulation Profile: Recent Labs  Lab 10/13/20 0312  INR 1.0   Thyroid Function Tests: No results for input(s): TSH, T4TOTAL, FREET4, T3FREE, THYROIDAB in the last 72 hours. Lipid Profile: No results for input(s): CHOL, HDL, LDLCALC, TRIG, CHOLHDL, LDLDIRECT in the last 72 hours. Anemia Panel: No results for input(s): VITAMINB12, FOLATE, FERRITIN, TIBC, IRON, RETICCTPCT in the last 72 hours. Urine analysis:    Component Value Date/Time   COLORURINE YELLOW 10/10/2020 1916   APPEARANCEUR CLEAR 10/10/2020 1916   LABSPEC <1.005 (L) 10/10/2020 1916   PHURINE 5.0 10/10/2020 1916   GLUCOSEU  NEGATIVE 10/10/2020 1916   HGBUR SMALL (A) 10/10/2020 1916   BILIRUBINUR NEGATIVE 10/10/2020 1916   KETONESUR 40 (A) 10/10/2020 1916   PROTEINUR NEGATIVE 10/10/2020 1916   UROBILINOGEN 1.0 08/20/2012 1345   NITRITE POSITIVE (A) 10/10/2020 1916   LEUKOCYTESUR NEGATIVE 10/10/2020 1916   Sepsis Labs: Invalid input(s): PROCALCITONIN, Gregory  Microbiology: Recent Results (from the past 240 hour(s))  Resp Panel by RT-PCR (Flu A&B, Covid) Nasopharyngeal Swab     Status: None   Collection Time: 10/10/20  5:18 PM   Specimen: Nasopharyngeal Swab; Nasopharyngeal(NP) swabs in vial transport medium  Result Value Ref Range Status   SARS Coronavirus 2 by RT  PCR NEGATIVE NEGATIVE Final    Comment: (NOTE) SARS-CoV-2 target nucleic acids are NOT DETECTED.  The SARS-CoV-2 RNA is generally detectable in upper respiratory specimens during the acute phase of infection. The lowest concentration of SARS-CoV-2 viral copies this assay can detect is 138 copies/mL. A negative result does not preclude SARS-Cov-2 infection and should not be used as the sole basis for treatment or other patient management decisions. A negative result may occur with  improper specimen collection/handling, submission of specimen other than nasopharyngeal swab, presence of viral mutation(s) within the areas targeted by this assay, and inadequate number of viral copies(<138 copies/mL). A negative result must be combined with clinical observations, patient history, and epidemiological information. The expected result is Negative.  Fact Sheet for Patients:  EntrepreneurPulse.com.au  Fact Sheet for Healthcare Providers:  IncredibleEmployment.be  This test is no t yet approved or cleared by the [place] FDA and  has been authorized for detection and/or diagnosis of SARS-CoV-2 by FDA under an Emergency Use Authorization (EUA). This EUA will remain  in effect (meaning this test can be  used) for the duration of the COVID-19 declaration under Section 564(b)(1) of the Act, 21 U.S.C.section 360bbb-3(b)(1), unless the authorization is terminated  or revoked sooner.       Influenza A by PCR NEGATIVE NEGATIVE Final   Influenza B by PCR NEGATIVE NEGATIVE Final    Comment: (NOTE) The Xpert Xpress SARS-CoV-2/FLU/RSV plus assay is intended as an aid in the diagnosis of influenza from Nasopharyngeal swab specimens and should not be used as a sole basis for treatment. Nasal washings and aspirates are unacceptable for Xpert Xpress SARS-CoV-2/FLU/RSV testing.  Fact Sheet for Patients: EntrepreneurPulse.com.au  Fact Sheet for Healthcare Providers: IncredibleEmployment.be  This test is not yet approved or cleared by the [place] FDA and has been authorized for detection and/or diagnosis of SARS-CoV-2 by FDA under an Emergency Use Authorization (EUA). This EUA will remain in effect (meaning this test can be used) for the duration of the COVID-19 declaration under Section 564(b)(1) of the Act, 21 U.S.C. section 360bbb-3(b)(1), unless the authorization is terminated or revoked.  Performed at [facility], [address], [place], [place] [address]     Radiology Studies: No results found.     Florencia Reasons, MD PhD FACP [facility]  If 7PM-7AM, please contact night-coverage [email or web address] 10/13/2020, 10:41 AM

## 2020-10-13 NOTE — Procedures (Signed)
Pre Procedure Dx: Indeterminate liver lesions Post Procedural Dx: Same  Non visualization of worrisome liver lesions potentially at least partially attributable to patient body habitus and hepatic steatosis.    No biopsy attempted.  PLAN:  - Recommend waiting for results of endoscopic biopsy performed earlier today. - Ultimately, further evaluation could be performed with PET-CT imaging as indicated. - Note, if tissue diagnosis is still required, would recommend attempting liver lesion biopsy with CT guidance as indicated, though this too likely will be challenging secondary to patient body habitus and central location of both dominant liver lesions.   Above discussed with Dr. Cristina Gong (GI) at the time of procedure completion.   Ronny Bacon, MD Pager #: [phone]

## 2020-10-13 NOTE — Progress Notes (Signed)
Patient's colonoscopy was well-prepped and well-tolerated.  There is some sort of an atypical lesion in the region of the cecum, distorting the anatomy, and suggestive of some sort of neoplasm, benign or malignant.  Multiple biopsies were obtained.  I was not able to enter the terminal ileum as such.  A couple of additional medium sized polyps were also removed.  The patient also has pancolonic diverticulosis.  Plan:  1.  Clear liquid diet (after IR liver biopsy) until it is discerned whether the patient will need surgery on this admission.  That way, she will not have to do a repeat bowel prep if surgery is needed.  2.  I have asked for a "rush" on the pathology specimen so we will hopefully have results by tomorrow morning.  Cleotis Nipper, M.D. Pager [phone] If no answer or after 5 PM call [phone]

## 2020-10-13 NOTE — Progress Notes (Signed)
Subjective: CC: Patient reports she was nauseated with prep yesterday. Had 1 episode of emesis yesterday around 3-4. Was able to tolerate further prep after as well as water without n/v. No abdominal pain, n/v this am. Feels bloated. Passing some flatus. Had multiple liquid stools with prep yesterday.   Objective: Vital signs in last 24 hours: Temp:  [97.9 F (36.6 C)-98.5 F (36.9 C)] 98 F (36.7 C) (06/22 0541) Pulse Rate:  [71-83] 82 (06/22 0541) Resp:  [16-18] 16 (06/22 0541) BP: (118-154)/(70-86) 119/76 (06/22 0541) SpO2:  [93 %-99 %] 93 % (06/22 0541) Last BM Date: 10/13/20  Intake/Output from previous day: No intake/output data recorded. Intake/Output this shift: No intake/output data recorded.  PE: Gen:  Alert, NAD, pleasant HEENT: EOM's intact, pupils equal and round Card:  RRR, no M/G/R heard Pulm:  CTAB, no W/R/R, effort normal Abd: Soft, mild distension, NT, +BS Ext:  No LE edema  Psych: A&Ox3 Skin: no rashes noted, warm and dry  Lab Results:  Recent Labs    10/10/20 1511 10/11/20 0350  WBC 5.4 4.5  HGB 14.2 13.0  HCT 44.2 40.4  PLT 342 283   BMET Recent Labs    10/12/20 0344 10/13/20 0312  NA 139 140  K 3.3* 4.0  CL 106 107  CO2 27 27  GLUCOSE 141* 115*  BUN 7 <5*  CREATININE 0.76 0.84  CALCIUM 8.7* 9.0   PT/INR Recent Labs    10/13/20 0312  LABPROT 13.5  INR 1.0   CMP     Component Value Date/Time   NA 140 10/13/2020 0312   K 4.0 10/13/2020 0312   CL 107 10/13/2020 0312   CO2 27 10/13/2020 0312   GLUCOSE 115 (H) 10/13/2020 0312   BUN <5 (L) 10/13/2020 0312   CREATININE 0.84 10/13/2020 0312   CALCIUM 9.0 10/13/2020 0312   PROT 6.4 (L) 10/12/2020 0344   ALBUMIN 3.0 (L) 10/12/2020 0344   AST 39 10/12/2020 0344   ALT 51 (H) 10/12/2020 0344   ALKPHOS 64 10/12/2020 0344   BILITOT 0.5 10/12/2020 0344   GFRNONAA >60 10/13/2020 0312   Lipase     Component Value Date/Time   LIPASE 23 10/10/2020 1511        Studies/Results: No results found.  Anti-infectives: Anti-infectives (From admission, onward)    Start     Dose/Rate Route Frequency Ordered Stop   10/11/20 0100  cefTRIAXone (ROCEPHIN) 1 g in sodium chloride 0.9 % 100 mL IVPB  Status:  Discontinued        1 g 200 mL/hr over 30 Minutes Intravenous Every 24 hours 10/11/20 0003 10/11/20 1138        Assessment/Plan Small bowel obstruction to level of terminal ileum Thickening of the terminal ileum Liver lesions Elevated CEA - CT w/ CT scan which shows SBO to the  level of the terminal ileum; thickening of the terminal ileum, soft tissue mass cannot be excluded; several ill-defined hypoattenuated masses throughout the liver, indeterminate. - MRI Multiple liver lesions concerning for metastatic disease as well as partially imaged terminal ileal thickening with increased mural enhancement and numerous prominent but nonenlarged adjacent ileocolic lymph nodes - CEA 37.7 - SBO possibly secondary to soft tissue mass at terminal ileum - Agree with plan for colonoscopy by GI which is planned for today - IR planning to Bx liver - Further recommendations pending results of colonoscopy and liver bx - To note, patient had abnormal mammogram and ultrasound of the  right breast in April/May. There is no palpable masses/lumps or lymph nodes on exam. This may need additional imaging. Results as below. - We will follow with you.   Mammogram 08/04/20: Further evaluation is suggested for a possible mass in the right breast.  Diagnostic Mammogram/R breast US 08/26/20: Intramammary lymph node (0.7 x 1 x 1.1 cm) over the 10 o'clock position of the right breast 12 cm from the nipple accounting for the mammographic finding with borderline cortical thickening as this likely represents a benign reactive lymph node. Ultrasound the right axilla demonstrates a few similar appearing lymph nodes with cortical thickness between 3-4 mm   ID - none currently VTE  - SCDs, lovenox FEN - IVF, NPO Foley - none Follow up - TBD   Obesity BMI 50.49   LOS: 3 days    Jillyn Ledger , [facility] 10/13/2020, 9:10 AM Please see Amion for pager number during day hours 7:00am-4:30pm

## 2020-10-13 NOTE — Interval H&P Note (Signed)
History and Physical Interval Note:  10/13/2020 11:48 AM  Lauren Mcpherson  has presented today for surgery, with the diagnosis of abnormal CT of terminal ileum.  The various methods of treatment have been discussed with the patient. After consideration of risks, benefits and other options for treatment, the patient has consented to  Procedure(s): COLONOSCOPY WITH PROPOFOL (N/A) as a surgical intervention.  The patient's history has been reviewed, patient examined, no change in status, stable for surgery.  I have reviewed the patient's chart and labs.  Questions were answered to the patient's satisfaction.     [name]

## 2020-10-13 NOTE — Transfer of Care (Signed)
Immediate Anesthesia Transfer of Care Note  Patient: Lauren Mcpherson  Procedure(s) Performed: COLONOSCOPY WITH PROPOFOL BIOPSY POLYPECTOMY  Patient Location: Endoscopy Unit  Anesthesia Type:MAC  Level of Consciousness: awake, alert , oriented and patient cooperative  Airway & Oxygen Therapy: Patient Spontanous Breathing and Patient connected to face mask oxygen  Post-op Assessment: Report given to RN, Post -op Vital signs reviewed and stable and Patient moving all extremities  Post vital signs: Reviewed and stable  Last Vitals:  Vitals Value Taken Time  BP    Temp    Pulse 83 10/13/20 1310  Resp 16 10/13/20 1311  SpO2 100 % 10/13/20 1310  Vitals shown include unvalidated device data.  Last Pain:  Vitals:   10/13/20 1059  TempSrc: Oral  PainSc: 0-No pain         Complications: No notable events documented.

## 2020-10-13 NOTE — Discharge Instructions (Signed)
YOU HAD AN ENDOSCOPIC PROCEDURE TODAY: Refer to the procedure report and other information in the discharge instructions given to you for any specific questions about what was found during the examination. If this information does not answer your questions, please call [facility] office at [phone] to clarify.   YOU SHOULD EXPECT: Some feelings of bloating in the abdomen. Passage of more gas than usual. Walking can help get rid of the air that was put into your GI tract during the procedure and reduce the bloating. If you had a lower endoscopy (such as a colonoscopy or flexible sigmoidoscopy) you may notice spotting of blood in your stool or on the toilet paper. Some abdominal soreness may be present for a day or two, also.  DIET: Your first meal following the procedure should be a light meal and then it is ok to progress to your normal diet. A half-sandwich or bowl of soup is an example of a good first meal. Heavy or fried foods are harder to digest and may make you feel nauseous or bloated. Drink plenty of fluids but you should avoid alcoholic beverages for 24 hours. If you had a esophageal dilation, please see attached instructions for diet.    ACTIVITY: Your care partner should take you home directly after the procedure. You should plan to take it easy, moving slowly for the rest of the day. You can resume normal activity the day after the procedure however YOU SHOULD NOT DRIVE, use power tools, machinery or perform tasks that involve climbing or major physical exertion for 24 hours (because of the sedation medicines used during the test).   SYMPTOMS TO REPORT IMMEDIATELY: A gastroenterologist can be reached at any hour. Please call [phone]  for any of the following symptoms:  Following lower endoscopy (colonoscopy, flexible sigmoidoscopy) Excessive amounts of blood in the stool  Significant tenderness, worsening of abdominal pains  Swelling of the abdomen that is new, acute  Fever of 100  or higher  Following upper endoscopy (EGD, EUS, ERCP, esophageal dilation) Vomiting of blood or coffee ground material  New, significant abdominal pain  New, significant chest pain or pain under the shoulder blades  Painful or persistently difficult swallowing  New shortness of breath  Black, tarry-looking or red, bloody stools  FOLLOW UP:  If any biopsies were taken you will be contacted by phone or by letter within the next 1-3 weeks. Call [phone]  if you have not heard about the biopsies in 3 weeks.  Please also call with any specific questions about appointments or follow up tests. YOU HAD AN ENDOSCOPIC PROCEDURE TODAY: Refer to the procedure report and other information in the discharge instructions given to you for any specific questions about what was found during the examination. If this information does not answer your questions, please call [facility] office at [phone] to clarify.   YOU SHOULD EXPECT: Some feelings of bloating in the abdomen. Passage of more gas than usual. Walking can help get rid of the air that was put into your GI tract during the procedure and reduce the bloating. If you had a lower endoscopy (such as a colonoscopy or flexible sigmoidoscopy) you may notice spotting of blood in your stool or on the toilet paper. Some abdominal soreness may be present for a day or two, also.  DIET: Your first meal following the procedure should be a light meal and then it is ok to progress to your normal diet. A half-sandwich or bowl of soup is an   example of a good first meal. Heavy or fried foods are harder to digest and may make you feel nauseous or bloated. Drink plenty of fluids but you should avoid alcoholic beverages for 24 hours. If you had a esophageal dilation, please see attached instructions for diet.    ACTIVITY: Your care partner should take you home directly after the procedure. You should plan to take it easy, moving slowly for the rest of the day. You can resume  normal activity the day after the procedure however YOU SHOULD NOT DRIVE, use power tools, machinery or perform tasks that involve climbing or major physical exertion for 24 hours (because of the sedation medicines used during the test).   SYMPTOMS TO REPORT IMMEDIATELY: A gastroenterologist can be reached at any hour. Please call [phone]  for any of the following symptoms:  Following lower endoscopy (colonoscopy, flexible sigmoidoscopy) Excessive amounts of blood in the stool  Significant tenderness, worsening of abdominal pains  Swelling of the abdomen that is new, acute  Fever of 100 or higher    FOLLOW UP:  If any biopsies were taken you will be contacted by phone or by letter within the next 1-3 weeks. Call [phone]  if you have not heard about the biopsies in 3 weeks.  Please also call with any specific questions about appointments or follow up tests. 

## 2020-10-13 NOTE — Anesthesia Postprocedure Evaluation (Signed)
Anesthesia Post Note  Patient: Lauren Mcpherson  Procedure(s) Performed: COLONOSCOPY WITH PROPOFOL BIOPSY POLYPECTOMY     Patient location during evaluation: PACU Anesthesia Type: MAC Level of consciousness: awake and alert Pain management: pain level controlled Vital Signs Assessment: post-procedure vital signs reviewed and stable Respiratory status: spontaneous breathing, nonlabored ventilation and respiratory function stable Cardiovascular status: stable and blood pressure returned to baseline Anesthetic complications: no   No notable events documented.  Last Vitals:  Vitals:   10/13/20 1320 10/13/20 1330  BP: 137/72 117/84  Pulse: 74 65  Resp: 17 17  Temp:    SpO2: 98% 98%    Last Pain:  Vitals:   10/13/20 1330  TempSrc:   PainSc: 0-No pain                 Audry Pili

## 2020-10-14 ENCOUNTER — Encounter (HOSPITAL_COMMUNITY): Payer: Self-pay | Admitting: Gastroenterology

## 2020-10-14 ENCOUNTER — Inpatient Hospital Stay (HOSPITAL_COMMUNITY): Payer: 59

## 2020-10-14 LAB — ABO/RH: ABO/RH(D): B POS

## 2020-10-14 LAB — BASIC METABOLIC PANEL
Anion gap: 8 (ref 5–15)
BUN: <5 mg/dL — ABNORMAL LOW (ref 6–20)
CO2: 25 mmol/L (ref 22–32)
Calcium: 8.4 mg/dL — ABNORMAL LOW (ref 8.9–10.3)
Chloride: 105 mmol/L (ref 98–111)
Creatinine, Ser: 0.73 mg/dL (ref 0.44–1.00)
GFR, Estimated: >60 mL/min (ref >60)
Glucose, Bld: 91 mg/dL (ref 70–99)
Potassium: 3.5 mmol/L (ref 3.5–5.1)
Sodium: 138 mmol/L (ref 135–145)

## 2020-10-14 LAB — TYPE AND SCREEN
ABO/RH(D): B POS
Antibody Screen: NEGATIVE

## 2020-10-14 LAB — SURGICAL PCR SCREEN
MRSA, PCR: NEGATIVE
Staphylococcus aureus: NEGATIVE

## 2020-10-14 LAB — SURGICAL PATHOLOGY

## 2020-10-14 MED ORDER — NEOMYCIN SULFATE 500 MG PO TABS
1000.0000 mg | ORAL_TABLET | ORAL | Status: AC
  Administered 2020-10-14 – 2020-10-15 (×3): 1000 mg via ORAL
  Filled 2020-10-14 (×3): qty 2

## 2020-10-14 MED ORDER — CHLORHEXIDINE GLUCONATE CLOTH 2 % EX PADS
6.0000 | MEDICATED_PAD | Freq: Once | CUTANEOUS | Status: AC
  Administered 2020-10-15: 6 via TOPICAL

## 2020-10-14 MED ORDER — ACETAMINOPHEN 500 MG PO TABS
1000.0000 mg | ORAL_TABLET | ORAL | Status: AC
Start: 2020-10-15 — End: 2020-10-15
  Administered 2020-10-15: 1000 mg via ORAL
  Filled 2020-10-14: qty 2

## 2020-10-14 MED ORDER — BOOST / RESOURCE BREEZE PO LIQD CUSTOM
1.0000 | Freq: Three times a day (TID) | ORAL | Status: DC
  Administered 2020-10-14 – 2020-10-17 (×3): 1 via ORAL

## 2020-10-14 MED ORDER — METRONIDAZOLE 500 MG PO TABS
1000.0000 mg | ORAL_TABLET | ORAL | Status: AC
  Administered 2020-10-14 – 2020-10-15 (×3): 1000 mg via ORAL
  Filled 2020-10-14 (×3): qty 2

## 2020-10-14 MED ORDER — CHLORHEXIDINE GLUCONATE CLOTH 2 % EX PADS
6.0000 | MEDICATED_PAD | Freq: Once | CUTANEOUS | Status: AC
  Administered 2020-10-14: 6 via TOPICAL

## 2020-10-14 MED ORDER — IOHEXOL 300 MG/ML  SOLN
75.0000 mL | Freq: Once | INTRAMUSCULAR | Status: AC | PRN
  Administered 2020-10-14: 75 mL via INTRAVENOUS

## 2020-10-14 MED ORDER — ENSURE PRE-SURGERY PO LIQD
296.0000 mL | Freq: Once | ORAL | Status: DC
  Filled 2020-10-14: qty 296

## 2020-10-14 MED ORDER — GABAPENTIN 300 MG PO CAPS
300.0000 mg | ORAL_CAPSULE | ORAL | Status: AC
  Administered 2020-10-15: 300 mg via ORAL
  Filled 2020-10-14: qty 1

## 2020-10-14 MED ORDER — ENSURE PRE-SURGERY PO LIQD
592.0000 mL | Freq: Once | ORAL | Status: AC
  Administered 2020-10-15: 592 mL via ORAL
  Filled 2020-10-14: qty 592

## 2020-10-14 MED ORDER — SODIUM CHLORIDE 0.9 % IV SOLN
2.0000 g | INTRAVENOUS | Status: AC
  Administered 2020-10-15: 2 g via INTRAVENOUS
  Filled 2020-10-14: qty 2

## 2020-10-14 MED ORDER — POTASSIUM CHLORIDE CRYS ER 20 MEQ PO TBCR
40.0000 meq | EXTENDED_RELEASE_TABLET | Freq: Once | ORAL | Status: AC
  Administered 2020-10-14: 40 meq via ORAL
  Filled 2020-10-14: qty 2

## 2020-10-14 NOTE — Progress Notes (Signed)
PROGRESS NOTE  Lauren Mcpherson DQQ:[id number] DOB: [birth year]   PCP: Pcp, No  Patient is from: Home.  DOA: 10/10/2020 LOS: 4  Chief complaints: Nausea and vomiting  Brief Narrative / Interim history: 55 year old F with PMH of morbid obesity and recent cholecystectomy presenting with nausea, vomiting and abdominal pain that have started the morning of presentation, and admitted for possible small bowel obstruction.  Blood work with mild transaminitis.  CT abdomen and pelvis showed several ill-defined hypoattenuated masses throughout the liver and SBO to the level of terminal ileum with thickened terminal ileum.  She did not require NG tube.  MRI ordered.  Patient was admitted and general surgery consulted.  Subjective: Seen and examined earlier this morning.   she denies ab pain, no n/v She is concerned about the biopsy result which showed adenocarcinoma, she states " I am processing it" she is slightly emotional.  Husband at bedside   Objective: Vitals:   10/14/20 0149 10/14/20 0547 10/14/20 0944 10/14/20 1313  BP: 119/74 125/74 (!) 141/96 (!) 149/92  Pulse: 84 76 83 70  Resp: 17 18 16 16   Temp: 97.6 F (36.4 C) 97.7 F (36.5 C) 97.9 F (36.6 C)   TempSrc: Oral Oral Oral   SpO2: 96% 96% 97% 98%  Weight:      Height:        Intake/Output Summary (Last 24 hours) at 10/14/2020 1731 Last data filed at 10/14/2020 1629 Gross per 24 hour  Intake 2113.51 ml  Output 0 ml  Net 2113.51 ml    Filed Weights   10/10/20 1458 10/13/20 1059  Weight: 129.3 kg 127 kg    Examination:  GENERAL: No apparent distress.  Nontoxic. HEENT: MMM.  Vision and hearing grossly intact.  NECK: Supple.  No apparent JVD.  RESP: On RA.  No IWOB.  Fair aeration bilaterally. CVS:  RRR. Heart sounds normal.  ABD/GI/GU: BS+. Abd soft, NTND.  MSK/EXT:  Moves extremities. No apparent deformity. No edema.  SKIN: no apparent skin lesion or wound NEURO: Awake, alert and oriented appropriately.  No apparent  focal neuro deficit. PSYCH: Calm. Normal affect.   Procedures:  None  Microbiology summarized: [id number] and influenza PCR nonreactive.  Assessment & Plan:  Small bowel obstruction, partial:  - presented with nausea vomiting abdominal pain  -s/p colonoscopy with biopsy of cecal mass on 6/22, pathology showed adenocarcinoma -Plan to right hemicolectomy tomorrow , possible open biopsy of the liver lesion  -Appreciate GI and general surgery recommendation, -Diet advancement per general surgery and GI  Liver masses:  CT abdomen and pelvis showed several ill-defined hypoattenuated masses throughout the liver.  She also had thickened distal ileum.  MRI suspicious for mets to liver No constitutional symptoms.  No personal or family history of cancer or GI disease.  Birads 3 mammo earlier this year advised f/u in 3 mo.  -cea 37.7 -Colonoscopy showed adenocarcinoma - liver biopsy by IR may not be possible due to bodies habitus -GI recommended open biopsy of liver lesion , f/u gen surg recs    Hyponatremia:  Resolved w/ fluids  Hypokalemia Mild, 3.3, mg wnl -Potassium 3.5 today, give another dose of k supplement  Morbid obesity Body mass index is 49.6 kg/m.  -Encourage lifestyle change to lose weight.       DVT prophylaxis:  SCD's Start: 10/14/20 1627  On subcu Lovenox  Code Status: Full code Family Communication: Husband at bedside Level of care: Med-Surg Status is: Inpatient   Dispo: The patient is  from: Home              Anticipated d/c is to: Home              Patient currently is not medically stable to d/c.,  Need general surgery clearance   Difficult to place patient No    Consultants:  General surgery GI IR   Sch Meds:  Scheduled Meds:  [START ON 10/15/2020] acetaminophen  1,000 mg Oral On Call to OR   Chlorhexidine Gluconate Cloth  6 each Topical Once   And   [START ON 10/15/2020] Chlorhexidine Gluconate Cloth  6 each Topical Once   enoxaparin  (LOVENOX) injection  60 mg Subcutaneous Q24H   feeding supplement  1 Container Oral TID BM   [START ON 10/15/2020] feeding supplement  296 mL Oral Once   feeding supplement  592 mL Oral Once   [START ON 10/15/2020] gabapentin  300 mg Oral On Call to OR   neomycin  1,000 mg Oral 3 times per day   And   metroNIDAZOLE  1,000 mg Oral 3 times per day   Continuous Infusions:  sodium chloride 250 mL (10/11/20 0134)   [START ON 10/15/2020] cefoTEtan (CEFOTAN) IV     PRN Meds:.sodium chloride, morphine injection, ondansetron **OR** ondansetron (ZOFRAN) IV  Antimicrobials: Anti-infectives (From admission, onward)    Start     Dose/Rate Route Frequency Ordered Stop   10/15/20 1400  cefoTEtan (CEFOTAN) 2 g in sodium chloride 0.9 % 100 mL IVPB        2 g 200 mL/hr over 30 Minutes Intravenous On call to O.R. 10/14/20 1626 10/16/20 0559   10/14/20 1700  neomycin (MYCIFRADIN) tablet 1,000 mg       See Hyperspace for full Linked Orders Report.   1,000 mg Oral 3 times per day 10/14/20 1626 10/15/20 1659   10/14/20 1700  metroNIDAZOLE (FLAGYL) tablet 1,000 mg       See Hyperspace for full Linked Orders Report.   1,000 mg Oral 3 times per day 10/14/20 1626 10/15/20 1659   10/11/20 0100  cefTRIAXone (ROCEPHIN) 1 g in sodium chloride 0.9 % 100 mL IVPB  Status:  Discontinued        1 g 200 mL/hr over 30 Minutes Intravenous Every 24 hours 10/11/20 0003 10/11/20 1138        I have personally reviewed the following labs and images: CBC: Recent Labs  Lab 10/10/20 1511 10/11/20 0350  WBC 5.4 4.5  NEUTROABS 2.5  --   HGB 14.2 13.0  HCT 44.2 40.4  MCV 95.1 94.6  PLT 342 283   BMP &GFR Recent Labs  Lab 10/10/20 1511 10/11/20 0350 10/12/20 0344 10/13/20 0312 10/14/20 0507  NA 134* 139 139 140 138  K 3.6 3.7 3.3* 4.0 3.5  CL 98 104 106 107 105  CO2 25 24 27 27 25   GLUCOSE 95 106* 141* 115* 91  BUN 12 10 7  <5* <5*  CREATININE 0.89 0.60 0.76 0.84 0.73  CALCIUM 8.9 8.7* 8.7* 9.0 8.4*  MG   --   --  1.9  --   --   PHOS  --   --  2.9  --   --    Estimated Creatinine Clearance: 104.3 mL/min (by C-G formula based on SCr of 0.73 mg/dL). Liver & Pancreas: Recent Labs  Lab 10/10/20 1511 10/11/20 0350 10/12/20 0344  AST 61* 44* 39  ALT 66* 58* 51*  ALKPHOS 72 63 64  BILITOT 0.8 0.9  0.5  PROT 7.4 6.4* 6.4*  ALBUMIN 3.5 3.1* 3.0*   Recent Labs  Lab 10/10/20 1511  LIPASE 23   No results for input(s): AMMONIA in the last 168 hours. Diabetic: No results for input(s): HGBA1C in the last 72 hours. No results for input(s): GLUCAP in the last 168 hours. Cardiac Enzymes: No results for input(s): CKTOTAL, CKMB, CKMBINDEX, TROPONINI in the last 168 hours. No results for input(s): PROBNP in the last 8760 hours. Coagulation Profile: Recent Labs  Lab 10/13/20 0312  INR 1.0   Thyroid Function Tests: No results for input(s): TSH, T4TOTAL, FREET4, T3FREE, THYROIDAB in the last 72 hours. Lipid Profile: No results for input(s): CHOL, HDL, LDLCALC, TRIG, CHOLHDL, LDLDIRECT in the last 72 hours. Anemia Panel: No results for input(s): VITAMINB12, FOLATE, FERRITIN, TIBC, IRON, RETICCTPCT in the last 72 hours. Urine analysis:    Component Value Date/Time   COLORURINE YELLOW 10/10/2020 1916   APPEARANCEUR CLEAR 10/10/2020 1916   LABSPEC <1.005 (L) 10/10/2020 1916   PHURINE 5.0 10/10/2020 1916   GLUCOSEU NEGATIVE 10/10/2020 1916   HGBUR SMALL (A) 10/10/2020 1916   BILIRUBINUR NEGATIVE 10/10/2020 1916   KETONESUR 40 (A) 10/10/2020 1916   PROTEINUR NEGATIVE 10/10/2020 1916   UROBILINOGEN 1.0 08/20/2012 1345   NITRITE POSITIVE (A) 10/10/2020 1916   LEUKOCYTESUR NEGATIVE 10/10/2020 1916   Sepsis Labs: Invalid input(s): PROCALCITONIN, [place]  Microbiology: Recent Results (from the past 240 hour(s))  Resp Panel by RT-PCR (Flu A&B, Covid) Nasopharyngeal Swab     Status: None   Collection Time: 10/10/20  5:18 PM   Specimen: Nasopharyngeal Swab; Nasopharyngeal(NP) swabs in  vial transport medium  Result Value Ref Range Status   SARS Coronavirus 2 by RT PCR NEGATIVE NEGATIVE Final    Comment: (NOTE) SARS-CoV-2 target nucleic acids are NOT DETECTED.  The SARS-CoV-2 RNA is generally detectable in upper respiratory specimens during the acute phase of infection. The lowest concentration of SARS-CoV-2 viral copies this assay can detect is 138 copies/mL. A negative result does not preclude SARS-Cov-2 infection and should not be used as the sole basis for treatment or other patient management decisions. A negative result may occur with  improper specimen collection/handling, submission of specimen other than nasopharyngeal swab, presence of viral mutation(s) within the areas targeted by this assay, and inadequate number of viral copies(<138 copies/mL). A negative result must be combined with clinical observations, patient history, and epidemiological information. The expected result is Negative.  Fact Sheet for Patients:  EntrepreneurPulse.com.au  Fact Sheet for Healthcare Providers:  IncredibleEmployment.be  This test is no t yet approved or cleared by the [place] FDA and  has been authorized for detection and/or diagnosis of SARS-CoV-2 by FDA under an Emergency Use Authorization (EUA). This EUA will remain  in effect (meaning this test can be used) for the duration of the COVID-19 declaration under Section 564(b)(1) of the Act, 21 U.S.C.section 360bbb-3(b)(1), unless the authorization is terminated  or revoked sooner.       Influenza A by PCR NEGATIVE NEGATIVE Final   Influenza B by PCR NEGATIVE NEGATIVE Final    Comment: (NOTE) The Xpert Xpress SARS-CoV-2/FLU/RSV plus assay is intended as an aid in the diagnosis of influenza from Nasopharyngeal swab specimens and should not be used as a sole basis for treatment. Nasal washings and aspirates are unacceptable for Xpert Xpress SARS-CoV-2/FLU/RSV testing.  Fact  Sheet for Patients: EntrepreneurPulse.com.au  Fact Sheet for Healthcare Providers: IncredibleEmployment.be  This test is not yet approved or cleared by the  [place] Architectural technologist and has been authorized for detection and/or diagnosis of SARS-CoV-2 by FDA under an Print production planner (EUA). This EUA will remain in effect (meaning this test can be used) for the duration of the COVID-19 declaration under Section 564(b)(1) of the Act, 21 U.S.C. section 360bbb-3(b)(1), unless the authorization is terminated or revoked.  Performed at [facility], [address], [place], [place] [address]     Radiology Studies: CT CHEST W CONTRAST  Result Date: 10/14/2020 CLINICAL DATA:  Metastatic colon cancer.  Staging CT scan. EXAM: CT CHEST WITH CONTRAST TECHNIQUE: Multidetector CT imaging of the chest was performed during intravenous contrast administration. CONTRAST:  67mL OMNIPAQUE IOHEXOL 300 MG/ML  SOLN COMPARISON:  CT scan 10/10/2020 FINDINGS: Cardiovascular: The heart is normal in size. No pericardial effusion. The aorta is normal in caliber. No dissection. No atherosclerotic calcifications. The branch vessels are patent. No definite coronary artery calcifications. Mediastinum/Nodes: No mediastinal or hilar mass or lymphadenopathy. The esophagus is unremarkable. Lungs/Pleura: The lungs are clear of an acute process. No infiltrates, edema or effusions. 4 mm nodule noted in the right lower lobe on image number 72/5. This is an indeterminate finding but I do not see any other pulmonary nodules to suggest pulmonary metastatic disease. Recommend attention on future/follow-up scans. Upper Abdomen: Liver lesions are not well demonstrated on this study. The gallbladder is surgically absent. No common bile duct dilatation. Stable low-attenuation cystic appearing lesion in the left upper quadrant could be projecting off the cardial region of the stomach or possibly  off the upper aspect of the adrenal gland. It measures 5 Hounsfield units and is likely a benign cyst. No upper abdominal adenopathy. Musculoskeletal: No breast masses, supraclavicular or axillary adenopathy. No significant bony findings. IMPRESSION: 1. Single 4 mm right lower lobe pulmonary nodule, likely benign but indeterminate. Attention on future scans is suggested. 2. No mediastinal or hilar mass or adenopathy. 3. Liver lesions are not well demonstrated on this study. 4. Stable low-attenuation cystic appearing lesion in the left upper quadrant, likely a benign cyst. Electronically Signed   By: Marijo Sanes M.D.   On: 10/14/2020 16:16       Florencia Reasons, MD PhD FACP [facility]  If 7PM-7AM, please contact night-coverage [email or web address] 10/14/2020, 5:31 PM

## 2020-10-14 NOTE — Progress Notes (Signed)
Patient's biopsies of her cecal mass came back showing adenocarcinoma.  Patient informed, and quite concerned, appropriately.  I received a call from the interventional radiologist yesterday, that they were unable to visualize her hepatic lesions by ultrasound, for the purpose of intended biopsy.  This may be in part due to the patient's large body habitus.  There is the option of doing a combined CT/ultrasound-guided biopsy, but apparently technically that is quite a bit more challenging.  Meanwhile, I have been in contact with the surgical service, who is planning a palliative right hemicolectomy tomorrow.  I would suggest evaluation of the liver, with possible intraoperative liver biopsy, at that time.  We will place the patient on a tentative 1 year follow-up colonoscopy list for follow-up of her cancer, but of course the appropriateness of that procedure at the time would be contingent on her overall medical status, particularly if she proves to have metastatic disease.  I will sign off.  Please call us if we can be of further assistance in this patient's care.  Cleotis Nipper, M.D. Pager [phone] If no answer or after 5 PM call [phone]

## 2020-10-14 NOTE — Progress Notes (Signed)
1 Day Post-Op  Subjective: CC: Colonoscopy results reviewed.  Patient reports no current abdominal pain.  Some nausea yesterday but this is resolved today.  Tolerating clears.  No emesis in the last 24 hours.  Passing some flatus.  Notes a small liquidy BM this morning.  Objective: Vital signs in last 24 hours: Temp:  [97.5 F (36.4 C)-98.2 F (36.8 C)] 97.7 F (36.5 C) (06/23 0547) Pulse Rate:  [56-84] 76 (06/23 0547) Resp:  [11-25] 18 (06/23 0547) BP: (110-151)/(72-102) 125/74 (06/23 0547) SpO2:  [96 %-100 %] 96 % (06/23 0547) Weight:  [655 kg] 127 kg (06/22 1059) Last BM Date: 10/13/20  Intake/Output from previous day: 06/22 0701 - 06/23 0700 In: 2035 [P.O.:240; I.V.:1795] Out: 0  Intake/Output this shift: No intake/output data recorded.  PE: Gen:  Alert, NAD, pleasant HEENT: EOM's intact, pupils equal and round Card:  RRR, no M/G/R heard Pulm:  CTAB, no W/R/R, effort normal Abd: Soft, mild distension, NT, +BS Ext:  No LE edema  Psych: A&Ox3 Skin: no rashes noted, warm and dry  Lab Results:  No results for input(s): WBC, HGB, HCT, PLT in the last 72 hours. BMET Recent Labs    10/13/20 0312 10/14/20 0507  NA 140 138  K 4.0 3.5  CL 107 105  CO2 27 25  GLUCOSE 115* 91  BUN <5* <5*  CREATININE 0.84 0.73  CALCIUM 9.0 8.4*   PT/INR Recent Labs    10/13/20 0312  LABPROT 13.5  INR 1.0   CMP     Component Value Date/Time   NA 138 10/14/2020 0507   K 3.5 10/14/2020 0507   CL 105 10/14/2020 0507   CO2 25 10/14/2020 0507   GLUCOSE 91 10/14/2020 0507   BUN <5 (L) 10/14/2020 0507   CREATININE 0.73 10/14/2020 0507   CALCIUM 8.4 (L) 10/14/2020 0507   PROT 6.4 (L) 10/12/2020 0344   ALBUMIN 3.0 (L) 10/12/2020 0344   AST 39 10/12/2020 0344   ALT 51 (H) 10/12/2020 0344   ALKPHOS 64 10/12/2020 0344   BILITOT 0.5 10/12/2020 0344   GFRNONAA >60 10/14/2020 0507   Lipase     Component Value Date/Time   LIPASE 23 10/10/2020 1511        Studies/Results: IR ABDOMEN US LIMITED  Result Date: 10/13/2020 CLINICAL DATA:  Concern for metastatic colon cancer, now with multiple liver lesions worrisome for metastatic disease. Please perform ultrasound-guided liver lesion biopsy for tissue diagnostic purposes. EXAM: ULTRASOUND ABDOMEN LIMITED COMPARISON:  Abdominal MRI-10/11/2020; CT abdomen pelvis-10/10/2020 FINDINGS: Exhaustive sonographic evaluation performed by the dictating interventional radiologist failed to confidently replicate any of the worrisome liver lesions demonstrated on preceding cross-sectional imaging. As such, biopsy was deferred at this time. IMPRESSION: Non visualization of worrisome liver lesions potentially at least partially attributable to patient body habitus and hepatic steatosis. No biopsy attempted. PLAN: - Recommend waiting for results of endoscopic biopsy performed earlier today. - Ultimately, further evaluation could be performed with PET-CT imaging as indicated. - Note, if tissue diagnosis is still required, would recommend attempting liver lesion biopsy with CT guidance as indicated, though this too likely will be challenging secondary to patient body habitus and central location of both dominant liver lesions. Above discussed with Dr. Cristina Gong (GI) at the time of procedure completion. Electronically Signed   By: Sandi Mariscal M.D.   On: 10/13/2020 16:17    Anti-infectives: Anti-infectives (From admission, onward)    Start     Dose/Rate Route Frequency Ordered Stop  10/11/20 0100  cefTRIAXone (ROCEPHIN) 1 g in sodium chloride 0.9 % 100 mL IVPB  Status:  Discontinued        1 g 200 mL/hr over 30 Minutes Intravenous Every 24 hours 10/11/20 0003 10/11/20 1138        Assessment/Plan Cecal Mass Liver lesions Elevated CEA - CT w/ CT scan which shows SBO to the level of the terminal ileum; thickening of the terminal ileum, soft tissue mass cannot be excluded; several ill-defined hypoattenuated masses  throughout the liver, indeterminate. - MRI Multiple liver lesions concerning for metastatic disease as well as partially imaged terminal ileal thickening with increased mural enhancement and numerous prominent but nonenlarged adjacent ileocolic lymph nodes. IR unable to biopsy lesions (note 6/22) - CEA 37.7 - Colonoscopy 6/22 showed frond-like/villous medium-sized non-circumferential mass in the cecum. No bleeding present. Bx taking. There was also a 89mm polyp at transverse colon and 61mm polyp at splenic flexure.  - Pathology pending from colonoscopy - Our team is discussing with colorectal to discuss best options moving forward - Will likely need CT chest as well for complete workup - Do not advance past CLD - Further recommendations to follow  To note, patient had abnormal mammogram and ultrasound of the right breast in April/May. There is no palpable masses/lumps or lymph nodes on exam. This may need additional imaging. Results as below. Mammogram 08/04/20: Further evaluation is suggested for a possible mass in the right breast.  Diagnostic Mammogram/R breast US 08/26/20: Intramammary lymph node (0.7 x 1 x 1.1 cm) over the 10 o'clock position of the right breast 12 cm from the nipple accounting for the mammographic finding with borderline cortical thickening as this likely represents a benign reactive lymph node. Ultrasound the right axilla demonstrates a few similar appearing lymph nodes with cortical thickness between 3-4 mm   ID - none currently VTE - SCDs, lovenox FEN - IVF, CLD Foley - none Follow up - TBD   Obesity BMI 50.49   LOS: 4 days    Jillyn Ledger , [facility] 10/14/2020, 9:03 AM Please see Amion for pager number during day hours 7:00am-4:30pm

## 2020-10-15 ENCOUNTER — Encounter (HOSPITAL_COMMUNITY)
Admission: EM | Disposition: A | Discharge status: Home / Self Care | Payer: Self-pay | Source: Home / Self Care | Attending: Internal Medicine

## 2020-10-15 ENCOUNTER — Inpatient Hospital Stay (HOSPITAL_COMMUNITY): Payer: 59

## 2020-10-15 ENCOUNTER — Encounter (HOSPITAL_COMMUNITY): Payer: Self-pay | Admitting: Family Medicine

## 2020-10-15 HISTORY — PX: LAPAROSCOPIC RIGHT COLECTOMY: SHX5925

## 2020-10-15 LAB — CBC
HCT: 42.3 % (ref 36.0–46.0)
Hemoglobin: 13.2 g/dL (ref 12.0–15.0)
MCH: 30.1 pg (ref 26.0–34.0)
MCHC: 31.2 g/dL (ref 30.0–36.0)
MCV: 96.6 fL (ref 80.0–100.0)
Platelets: 287 10*3/uL (ref 150–400)
RBC: 4.38 MIL/uL (ref 3.87–5.11)
RDW: 15.2 % (ref 11.5–15.5)
WBC: 5.7 10*3/uL (ref 4.0–10.5)
nRBC: 0 % (ref 0.0–0.2)

## 2020-10-15 LAB — BASIC METABOLIC PANEL
Anion gap: 10 (ref 5–15)
BUN: <5 mg/dL — ABNORMAL LOW (ref 6–20)
CO2: 24 mmol/L (ref 22–32)
Calcium: 8.9 mg/dL (ref 8.9–10.3)
Chloride: 105 mmol/L (ref 98–111)
Creatinine, Ser: 0.81 mg/dL (ref 0.44–1.00)
GFR, Estimated: >60 mL/min (ref >60)
Glucose, Bld: 109 mg/dL — ABNORMAL HIGH (ref 70–99)
Potassium: 4 mmol/L (ref 3.5–5.1)
Sodium: 139 mmol/L (ref 135–145)

## 2020-10-15 LAB — MAGNESIUM: Magnesium: 1.7 mg/dL (ref 1.7–2.4)

## 2020-10-15 LAB — PREALBUMIN: Prealbumin: 12.6 mg/dL — ABNORMAL LOW (ref 18–38)

## 2020-10-15 SURGERY — COLECTOMY, RIGHT, LAPAROSCOPIC
Anesthesia: General | Laterality: Right

## 2020-10-15 MED ORDER — BUPIVACAINE-EPINEPHRINE 0.25% -1:200000 IJ SOLN
INTRAMUSCULAR | Status: DC | PRN
  Administered 2020-10-15: 30 mL

## 2020-10-15 MED ORDER — DIPHENHYDRAMINE HCL 50 MG/ML IJ SOLN: 25.0000 mg | Freq: Four times a day (QID) | INTRAMUSCULAR | Status: DC | PRN

## 2020-10-15 MED ORDER — KETAMINE HCL 10 MG/ML IJ SOLN
INTRAMUSCULAR | Status: AC
  Filled 2020-10-15: qty 1

## 2020-10-15 MED ORDER — STERILE WATER FOR IRRIGATION IR SOLN
Status: DC | PRN
  Administered 2020-10-15: 1000 mL

## 2020-10-15 MED ORDER — LIDOCAINE HCL 2 % IJ SOLN
INTRAMUSCULAR | Status: AC
  Filled 2020-10-15: qty 20

## 2020-10-15 MED ORDER — SUGAMMADEX SODIUM 500 MG/5ML IV SOLN
INTRAVENOUS | Status: AC
  Filled 2020-10-15: qty 5

## 2020-10-15 MED ORDER — ONDANSETRON HCL 4 MG/2ML IJ SOLN
INTRAMUSCULAR | Status: DC | PRN
  Administered 2020-10-15: 4 mg via INTRAVENOUS

## 2020-10-15 MED ORDER — ROCURONIUM BROMIDE 10 MG/ML (PF) SYRINGE
PREFILLED_SYRINGE | INTRAVENOUS | Status: AC
  Filled 2020-10-15: qty 10

## 2020-10-15 MED ORDER — SIMETHICONE 80 MG PO CHEW: 40.0000 mg | CHEWABLE_TABLET | Freq: Four times a day (QID) | ORAL | Status: DC | PRN

## 2020-10-15 MED ORDER — LIDOCAINE HCL (PF) 2 % IJ SOLN: INTRAMUSCULAR | Status: DC | PRN

## 2020-10-15 MED ORDER — HEMOSTATIC AGENTS (NO CHARGE) OPTIME
TOPICAL | Status: DC | PRN
  Administered 2020-10-15: 1

## 2020-10-15 MED ORDER — DEXAMETHASONE SODIUM PHOSPHATE 10 MG/ML IJ SOLN
INTRAMUSCULAR | Status: DC | PRN
  Administered 2020-10-15: 10 mg via INTRAVENOUS

## 2020-10-15 MED ORDER — LACTATED RINGERS IV SOLN: INTRAVENOUS | Status: DC | PRN

## 2020-10-15 MED ORDER — DIPHENHYDRAMINE HCL 25 MG PO CAPS: 25.0000 mg | ORAL_CAPSULE | Freq: Four times a day (QID) | ORAL | Status: DC | PRN

## 2020-10-15 MED ORDER — LACTATED RINGERS IV SOLN: INTRAVENOUS | Status: DC

## 2020-10-15 MED ORDER — FENTANYL CITRATE (PF) 100 MCG/2ML IJ SOLN
INTRAMUSCULAR | Status: AC
  Filled 2020-10-15: qty 2

## 2020-10-15 MED ORDER — PROPOFOL 10 MG/ML IV BOLUS
INTRAVENOUS | Status: DC | PRN
  Administered 2020-10-15: 200 mg via INTRAVENOUS

## 2020-10-15 MED ORDER — ENSURE SURGERY PO LIQD: 237.0000 mL | Freq: Two times a day (BID) | ORAL | Status: DC

## 2020-10-15 MED ORDER — ENOXAPARIN SODIUM 60 MG/0.6ML IJ SOSY
60.0000 mg | PREFILLED_SYRINGE | INTRAMUSCULAR | Status: DC
  Administered 2020-10-16 – 2020-10-18 (×3): 60 mg via SUBCUTANEOUS
  Filled 2020-10-15 (×3): qty 0.6

## 2020-10-15 MED ORDER — FENTANYL CITRATE (PF) 250 MCG/5ML IJ SOLN
INTRAMUSCULAR | Status: DC | PRN
  Administered 2020-10-15 (×4): 50 ug via INTRAVENOUS
  Administered 2020-10-15: 75 ug via INTRAVENOUS
  Administered 2020-10-15: 25 ug via INTRAVENOUS

## 2020-10-15 MED ORDER — 0.9 % SODIUM CHLORIDE (POUR BTL) OPTIME
TOPICAL | Status: DC | PRN
  Administered 2020-10-15: 2000 mL

## 2020-10-15 MED ORDER — CHLORHEXIDINE GLUCONATE CLOTH 2 % EX PADS
6.0000 | MEDICATED_PAD | Freq: Every day | CUTANEOUS | Status: DC
  Administered 2020-10-15: 6 via TOPICAL

## 2020-10-15 MED ORDER — SUGAMMADEX SODIUM 500 MG/5ML IV SOLN
INTRAVENOUS | Status: DC | PRN
  Administered 2020-10-15: 500 mg via INTRAVENOUS

## 2020-10-15 MED ORDER — HYDROMORPHONE HCL 1 MG/ML IJ SOLN
0.2500 mg | INTRAMUSCULAR | Status: DC | PRN
  Administered 2020-10-15 (×2): 0.5 mg via INTRAVENOUS

## 2020-10-15 MED ORDER — BUPIVACAINE LIPOSOME 1.3 % IJ SUSP
20.0000 mL | Freq: Once | INTRAMUSCULAR | Status: AC
  Administered 2020-10-15: 20 mL
  Filled 2020-10-15: qty 20

## 2020-10-15 MED ORDER — LACTATED RINGERS IR SOLN
Status: DC | PRN
  Administered 2020-10-15: 1000 mL

## 2020-10-15 MED ORDER — CHLORHEXIDINE GLUCONATE 0.12 % MT SOLN
15.0000 mL | Freq: Once | OROMUCOSAL | Status: AC
  Administered 2020-10-15: 15 mL via OROMUCOSAL

## 2020-10-15 MED ORDER — MEPERIDINE HCL 50 MG/ML IJ SOLN: 6.2500 mg | INTRAMUSCULAR | Status: DC | PRN

## 2020-10-15 MED ORDER — ALBUMIN HUMAN 5 % IV SOLN: INTRAVENOUS | Status: DC | PRN

## 2020-10-15 MED ORDER — ROCURONIUM BROMIDE 10 MG/ML (PF) SYRINGE
PREFILLED_SYRINGE | INTRAVENOUS | Status: DC | PRN
  Administered 2020-10-15: 60 mg via INTRAVENOUS
  Administered 2020-10-15: 10 mg via INTRAVENOUS
  Administered 2020-10-15: 30 mg via INTRAVENOUS

## 2020-10-15 MED ORDER — HYDRALAZINE HCL 20 MG/ML IJ SOLN: 10.0000 mg | INTRAMUSCULAR | Status: DC | PRN

## 2020-10-15 MED ORDER — LIDOCAINE HCL (PF) 2 % IJ SOLN
INTRAMUSCULAR | Status: DC | PRN
  Administered 2020-10-15: 1.5 mg/kg/h via INTRADERMAL

## 2020-10-15 MED ORDER — SACCHAROMYCES BOULARDII 250 MG PO CAPS
250.0000 mg | ORAL_CAPSULE | Freq: Two times a day (BID) | ORAL | Status: DC
  Administered 2020-10-15 – 2020-10-18 (×6): 250 mg via ORAL
  Filled 2020-10-15 (×6): qty 1

## 2020-10-15 MED ORDER — MIDAZOLAM HCL 2 MG/2ML IJ SOLN
INTRAMUSCULAR | Status: AC
  Filled 2020-10-15: qty 2

## 2020-10-15 MED ORDER — LIDOCAINE 2% (20 MG/ML) 5 ML SYRINGE
INTRAMUSCULAR | Status: DC | PRN
  Administered 2020-10-15: 100 mg via INTRAVENOUS

## 2020-10-15 MED ORDER — HYDROMORPHONE HCL 1 MG/ML IJ SOLN
INTRAMUSCULAR | Status: AC
  Filled 2020-10-15: qty 1

## 2020-10-15 MED ORDER — ACETAMINOPHEN 500 MG PO TABS
1000.0000 mg | ORAL_TABLET | Freq: Four times a day (QID) | ORAL | Status: DC
  Administered 2020-10-15 – 2020-10-17 (×7): 1000 mg via ORAL
  Filled 2020-10-15 (×7): qty 2

## 2020-10-15 MED ORDER — PROMETHAZINE HCL 25 MG/ML IJ SOLN
6.2500 mg | INTRAMUSCULAR | Status: DC | PRN
  Administered 2020-10-15: 12.5 mg via INTRAVENOUS

## 2020-10-15 MED ORDER — PROMETHAZINE HCL 25 MG/ML IJ SOLN
INTRAMUSCULAR | Status: AC
  Filled 2020-10-15: qty 1

## 2020-10-15 MED ORDER — OXYCODONE HCL 5 MG PO TABS: 5.0000 mg | ORAL_TABLET | ORAL | Status: DC | PRN

## 2020-10-15 MED ORDER — KETAMINE HCL 10 MG/ML IJ SOLN
INTRAMUSCULAR | Status: DC | PRN
  Administered 2020-10-15: 30 mg via INTRAVENOUS

## 2020-10-15 MED ORDER — SODIUM CHLORIDE 0.9 % IV SOLN: INTRAVENOUS | Status: DC

## 2020-10-15 MED ORDER — MIDAZOLAM HCL 5 MG/5ML IJ SOLN
INTRAMUSCULAR | Status: DC | PRN
  Administered 2020-10-15: 2 mg via INTRAVENOUS

## 2020-10-15 MED ORDER — ALBUMIN HUMAN 5 % IV SOLN
INTRAVENOUS | Status: AC
  Filled 2020-10-15: qty 500

## 2020-10-15 SURGICAL SUPPLY — 79 items
APPLIER CLIP 5 13 M/L LIGAMAX5 (MISCELLANEOUS)
APPLIER CLIP ROT 10 11.4 M/L (STAPLE)
BAG COUNTER SPONGE SURGICOUNT (BAG) IMPLANT
BENZOIN TINCTURE PRP APPL 2/3 (GAUZE/BANDAGES/DRESSINGS) IMPLANT
BLADE EXTENDED COATED 6.5IN (ELECTRODE) IMPLANT
BNDG ADH 1X3 SHEER STRL LF (GAUZE/BANDAGES/DRESSINGS) IMPLANT
CABLE HIGH FREQUENCY MONO STRZ (ELECTRODE) ×2 IMPLANT
CELLS DAT CNTRL 66122 CELL SVR (MISCELLANEOUS) ×1 IMPLANT
CHLORAPREP W/TINT 26 (MISCELLANEOUS) ×2 IMPLANT
CLIP APPLIE 5 13 M/L LIGAMAX5 (MISCELLANEOUS) IMPLANT
CLIP APPLIE ROT 10 11.4 M/L (STAPLE) IMPLANT
COUNTER NEEDLE 20 DBL MAG RED (NEEDLE) ×2 IMPLANT
COVER MAYO STAND STRL (DRAPES) IMPLANT
COVER SURGICAL LIGHT HANDLE (MISCELLANEOUS) ×2 IMPLANT
DECANTER SPIKE VIAL GLASS SM (MISCELLANEOUS) ×2 IMPLANT
DRAIN CHANNEL 19F RND (DRAIN) IMPLANT
DRAPE LAPAROSCOPIC ABDOMINAL (DRAPES) IMPLANT
DRAPE UTILITY XL STRL (DRAPES) IMPLANT
DRSG OPSITE POSTOP 4X10 (GAUZE/BANDAGES/DRESSINGS) IMPLANT
DRSG OPSITE POSTOP 4X6 (GAUZE/BANDAGES/DRESSINGS) IMPLANT
DRSG OPSITE POSTOP 4X8 (GAUZE/BANDAGES/DRESSINGS) ×2 IMPLANT
DRSG TEGADERM 2-3/8X2-3/4 SM (GAUZE/BANDAGES/DRESSINGS) ×2 IMPLANT
ELECT PENCIL ROCKER SW 15FT (MISCELLANEOUS) ×2 IMPLANT
ELECT REM PT RETURN 15FT ADLT (MISCELLANEOUS) ×2 IMPLANT
ENDOLOOP SUT PDS II  0 18 (SUTURE)
ENDOLOOP SUT PDS II 0 18 (SUTURE) IMPLANT
GAUZE SPONGE 4X4 12PLY STRL (GAUZE/BANDAGES/DRESSINGS) IMPLANT
GLOVE SURG POLYISO LF SZ7 (GLOVE) ×4 IMPLANT
GLOVE SURG UNDER POLY LF SZ7 (GLOVE) ×4 IMPLANT
GOWN STRL REUS W/TWL XL LVL3 (GOWN DISPOSABLE) ×8 IMPLANT
HANDLE SUCTION POOLE (INSTRUMENTS) IMPLANT
HEMOSTAT SNOW SURGICEL 2X4 (HEMOSTASIS) ×2 IMPLANT
HOVERMATT SINGLE USE (MISCELLANEOUS) ×2 IMPLANT
IRRIG SUCT STRYKERFLOW 2 WTIP (MISCELLANEOUS)
IRRIGATION SUCT STRKRFLW 2 WTP (MISCELLANEOUS) IMPLANT
KIT TURNOVER KIT A (KITS) ×2 IMPLANT
LEGGING LITHOTOMY PAIR STRL (DRAPES) IMPLANT
LIGASURE IMPACT 36 18CM CVD LR (INSTRUMENTS) ×2 IMPLANT
PACK COLON (CUSTOM PROCEDURE TRAY) ×2 IMPLANT
PAD POSITIONING PINK XL (MISCELLANEOUS) IMPLANT
PENCIL SMOKE EVACUATOR (MISCELLANEOUS) IMPLANT
PROTECTOR NERVE ULNAR (MISCELLANEOUS) IMPLANT
RELOAD PROXIMATE 75MM BLUE (ENDOMECHANICALS) ×4 IMPLANT
RELOAD STAPLER WHITE 60MM (STAPLE) IMPLANT
RTRCTR WOUND ALEXIS 18CM MED (MISCELLANEOUS) ×2
SCISSORS LAP 5X35 DISP (ENDOMECHANICALS) ×2 IMPLANT
SEALER TISSUE G2 STRG ARTC 35C (ENDOMECHANICALS) IMPLANT
SET IRRIG TUBING LAPAROSCOPIC (IRRIGATION / IRRIGATOR) ×2 IMPLANT
SET TUBE SMOKE EVAC HIGH FLOW (TUBING) ×2 IMPLANT
SHEARS HARMONIC ACE PLUS 45CM (MISCELLANEOUS) ×2 IMPLANT
SLEEVE XCEL OPT CAN 5 100 (ENDOMECHANICALS) ×8 IMPLANT
SPONGE GAUZE 2X2 8PLY STRL LF (GAUZE/BANDAGES/DRESSINGS) ×2 IMPLANT
STAPLER ECHELON LONG 60 440 (INSTRUMENTS) IMPLANT
STAPLER GUN LINEAR PROX 60 (STAPLE) ×2 IMPLANT
STAPLER PROXIMATE 75MM BLUE (STAPLE) ×2 IMPLANT
STAPLER RELOAD WHITE 60MM (STAPLE)
STAPLER VISISTAT 35W (STAPLE) IMPLANT
STRIP CLOSURE SKIN 1/2X4 (GAUZE/BANDAGES/DRESSINGS) IMPLANT
SUCTION POOLE HANDLE (INSTRUMENTS)
SUT PDS AB 0 CT1 36 (SUTURE) IMPLANT
SUT PROLENE 2 0 KS (SUTURE) IMPLANT
SUT SILK 2 0 (SUTURE) ×1
SUT SILK 2 0 SH CR/8 (SUTURE) ×2 IMPLANT
SUT SILK 2-0 18XBRD TIE 12 (SUTURE) ×1 IMPLANT
SUT SILK 3 0 (SUTURE) ×1
SUT SILK 3 0 SH CR/8 (SUTURE) ×2 IMPLANT
SUT SILK 3-0 18XBRD TIE 12 (SUTURE) ×1 IMPLANT
SUT VIC AB 2-0 SH 27 (SUTURE) ×1
SUT VIC AB 2-0 SH 27X BRD (SUTURE) ×1 IMPLANT
SUT VICRYL 0 ENDOLOOP (SUTURE) IMPLANT
SYS LAPSCP GELPORT 120MM (MISCELLANEOUS)
SYSTEM LAPSCP GELPORT 120MM (MISCELLANEOUS) IMPLANT
TAPE CLOTH 4X10 WHT NS (GAUZE/BANDAGES/DRESSINGS) ×2 IMPLANT
TOWEL OR 17X26 10 PK STRL BLUE (TOWEL DISPOSABLE) IMPLANT
TOWEL OR NON WOVEN STRL DISP B (DISPOSABLE) IMPLANT
TRAY FOLEY MTR SLVR 14FR STAT (SET/KITS/TRAYS/PACK) ×2 IMPLANT
TROCAR BLADELESS OPT 5 100 (ENDOMECHANICALS) ×2 IMPLANT
TROCAR XCEL 12X100 BLDLESS (ENDOMECHANICALS) ×2 IMPLANT
TUBE CONNECTING 12X1/4 (SUCTIONS) ×6 IMPLANT

## 2020-10-15 NOTE — Progress Notes (Signed)
PROGRESS NOTE  ZAIDY ABSHER QJJ:[id number] DOB: [birth year]   PCP: Pcp, No  Patient is from: Home.  DOA: 10/10/2020 LOS: 5  Chief complaints: Nausea and vomiting  Brief Narrative / Interim history: 55 year old F with PMH of morbid obesity and recent cholecystectomy presenting with nausea, vomiting and abdominal pain that have started the morning of presentation, and admitted for possible small bowel obstruction.  Blood work with mild transaminitis.  CT abdomen and pelvis showed several ill-defined hypoattenuated masses throughout the liver and SBO to the level of terminal ileum with thickened terminal ileum.  She did not require NG tube.  MRI ordered.  Patient was admitted and general surgery consulted.  Subjective: She is seen after returned from OR, she is weak but aaox3, not in distress, husband at bedside   Objective: Vitals:   10/15/20 1700 10/15/20 1715 10/15/20 1730 10/15/20 1757  BP: (!) 165/85 (!) 162/87 (!) 154/81 (!) 177/89  Pulse: 75 72 66 74  Resp: (!) 22 20 18 17   Temp:  98.1 F (36.7 C)    TempSrc:      SpO2: 91% 92% 92% (!) 89%  Weight:      Height:        Intake/Output Summary (Last 24 hours) at 10/15/2020 1832 Last data filed at 10/15/2020 1556 Gross per 24 hour  Intake 2064.67 ml  Output 430 ml  Net 1634.67 ml    Filed Weights   10/10/20 1458 10/13/20 1059 10/15/20 1204  Weight: 129.3 kg 127 kg 127 kg    Examination:  GENERAL: No apparent distress.  Nontoxic. HEENT: MMM.  Vision and hearing grossly intact.  NECK: Supple.  No apparent JVD.  RESP: On RA.  No IWOB.  Fair aeration bilaterally. CVS:  RRR. Heart sounds normal.  ABD/GI/GU: BS+. Post op changes, decreased bowel sounds  MSK/EXT:  Moves extremities. No apparent deformity. No edema.  SKIN: no apparent skin lesion or wound NEURO: Awake, alert and oriented appropriately.  No apparent focal neuro deficit. PSYCH: Calm. Normal affect.   Procedures:  None  Microbiology  summarized: [id number] and influenza PCR nonreactive.  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  She also had thickened distal ileum.  MRI suspicious for mets to liver No constitutional symptoms.  No personal or family history of cancer or GI disease.  Birads 3 mammo earlier this year advised f/u in 3 mo.  -cea 37.7 -Colonoscopy showed adenocarcinoma - liver biopsy by IR may not be possible due to bodies habitus - LAPAROSCOPIC LIVER BIOPSY on 6/24    Hyponatremia:  Resolved w/ fluids  Hypokalemia Mild, 3.3, mg wnl -replaced and normalized   Morbid obesity Body mass index is 49.6 kg/m.  -Encourage lifestyle change to lose weight.       DVT prophylaxis:  SCD's Start: 10/15/20 1800 Place TED hose Start: 10/15/20 1800  On subcu Lovenox  Code Status: Full code Family Communication: Husband at bedside Level of care: Med-Surg Status is: Inpatient   Dispo: The patient is from: Home              Anticipated d/c is to: Home  Patient currently is not medically stable to d/c.,  Need general surgery clearance   Difficult to place patient No    Consultants:  General surgery GI IR   Sch Meds:  Scheduled Meds:  acetaminophen  1,000 mg Oral Q6H   [START ON 10/16/2020] enoxaparin (LOVENOX) injection  60 mg Subcutaneous Q24H   feeding supplement  1 Container Oral TID BM   [START ON 10/16/2020] feeding supplement  237 mL Oral BID BM   HYDROmorphone       promethazine       saccharomyces boulardii  250 mg Oral BID   Continuous Infusions:   sodium chloride 250 mL (10/11/20 0134)   sodium chloride 75 mL/hr at 10/15/20 1812   PRN Meds:.sodium chloride, diphenhydrAMINE **OR** diphenhydrAMINE, hydrALAZINE, morphine injection, ondansetron **OR** ondansetron (ZOFRAN) IV, oxyCODONE, simethicone  Antimicrobials: Anti-infectives (From admission, onward)    Start     Dose/Rate Route Frequency Ordered Stop   10/15/20 1400  cefoTEtan (CEFOTAN) 2 g in sodium chloride 0.9 % 100 mL IVPB        2 g 200 mL/hr over 30 Minutes Intravenous On call to O.R. 10/14/20 1626 10/15/20 1308   10/14/20 1700  neomycin (MYCIFRADIN) tablet 1,000 mg       See Hyperspace for full Linked Orders Report.   1,000 mg Oral 3 times per day 10/14/20 1626 10/15/20 0017   10/14/20 1700  metroNIDAZOLE (FLAGYL) tablet 1,000 mg       See Hyperspace for full Linked Orders Report.   1,000 mg Oral 3 times per day 10/14/20 1626 10/15/20 0016   10/11/20 0100  cefTRIAXone (ROCEPHIN) 1 g in sodium chloride 0.9 % 100 mL IVPB  Status:  Discontinued        1 g 200 mL/hr over 30 Minutes Intravenous Every 24 hours 10/11/20 0003 10/11/20 1138        I have personally reviewed the following labs and images: CBC: Recent Labs  Lab 10/10/20 1511 10/11/20 0350 10/15/20 0409  WBC 5.4 4.5 5.7  NEUTROABS 2.5  --   --   HGB 14.2 13.0 13.2  HCT 44.2 40.4 42.3  MCV 95.1 94.6 96.6  PLT 342 283 287   BMP &GFR Recent Labs  Lab 10/11/20 0350 10/12/20 0344 10/13/20 0312 10/14/20 0507 10/15/20 0409  NA 139 139 140 138 139  K 3.7 3.3* 4.0 3.5 4.0  CL 104 106 107 105 105  CO2 24 27 27 25 24   GLUCOSE 106* 141* 115* 91 109*  BUN 10 7 <5* <5* <5*  CREATININE 0.60 0.76 0.84 0.73 0.81  CALCIUM 8.7* 8.7* 9.0 8.4* 8.9  MG  --  1.9  --   --  1.7  PHOS  --  2.9  --   --   --    Estimated Creatinine Clearance: 103 mL/min (by C-G formula based on SCr of 0.81 mg/dL). Liver & Pancreas: Recent Labs  Lab 10/10/20 1511 10/11/20 0350 10/12/20 0344  AST 61* 44* 39  ALT 66* 58* 51*   ALKPHOS 72 63 64  BILITOT 0.8 0.9 0.5  PROT 7.4 6.4* 6.4*  ALBUMIN 3.5 3.1* 3.0*   Recent Labs  Lab 10/10/20 1511  LIPASE 23   No results for input(s): AMMONIA in the last 168 hours. Diabetic: No results for input(s): HGBA1C in the last 72 hours. No results for input(s): GLUCAP in the last 168 hours. Cardiac Enzymes: No results for input(s): CKTOTAL, CKMB, CKMBINDEX, TROPONINI in the last 168 hours. No results for  input(s): PROBNP in the last 8760 hours. Coagulation Profile: Recent Labs  Lab 10/13/20 0312  INR 1.0   Thyroid Function Tests: No results for input(s): TSH, T4TOTAL, FREET4, T3FREE, THYROIDAB in the last 72 hours. Lipid Profile: No results for input(s): CHOL, HDL, LDLCALC, TRIG, CHOLHDL, LDLDIRECT in the last 72 hours. Anemia Panel: No results for input(s): VITAMINB12, FOLATE, FERRITIN, TIBC, IRON, RETICCTPCT in the last 72 hours. Urine analysis:    Component Value Date/Time   COLORURINE YELLOW 10/10/2020 1916   APPEARANCEUR CLEAR 10/10/2020 1916   LABSPEC <1.005 (L) 10/10/2020 1916   PHURINE 5.0 10/10/2020 1916   GLUCOSEU NEGATIVE 10/10/2020 1916   HGBUR SMALL (A) 10/10/2020 1916   BILIRUBINUR NEGATIVE 10/10/2020 1916   KETONESUR 40 (A) 10/10/2020 1916   PROTEINUR NEGATIVE 10/10/2020 1916   UROBILINOGEN 1.0 08/20/2012 1345   NITRITE POSITIVE (A) 10/10/2020 1916   LEUKOCYTESUR NEGATIVE 10/10/2020 1916   Sepsis Labs: Invalid input(s): PROCALCITONIN, [place]  Microbiology: Recent Results (from the past 240 hour(s))  Resp Panel by RT-PCR (Flu A&B, Covid) Nasopharyngeal Swab     Status: None   Collection Time: 10/10/20  5:18 PM   Specimen: Nasopharyngeal Swab; Nasopharyngeal(NP) swabs in vial transport medium  Result Value Ref Range Status   SARS Coronavirus 2 by RT PCR NEGATIVE NEGATIVE Final    Comment: (NOTE) SARS-CoV-2 target nucleic acids are NOT DETECTED.  The SARS-CoV-2 RNA is generally detectable in upper respiratory specimens during the  acute phase of infection. The lowest concentration of SARS-CoV-2 viral copies this assay can detect is 138 copies/mL. A negative result does not preclude SARS-Cov-2 infection and should not be used as the sole basis for treatment or other patient management decisions. A negative result may occur with  improper specimen collection/handling, submission of specimen other than nasopharyngeal swab, presence of viral mutation(s) within the areas targeted by this assay, and inadequate number of viral copies(<138 copies/mL). A negative result must be combined with clinical observations, patient history, and epidemiological information. The expected result is Negative.  Fact Sheet for Patients:  EntrepreneurPulse.com.au  Fact Sheet for Healthcare Providers:  IncredibleEmployment.be  This test is no t yet approved or cleared by the [place] FDA and  has been authorized for detection and/or diagnosis of SARS-CoV-2 by FDA under an Emergency Use Authorization (EUA). This EUA will remain  in effect (meaning this test can be used) for the duration of the COVID-19 declaration under Section 564(b)(1) of the Act, 21 U.S.C.section 360bbb-3(b)(1), unless the authorization is terminated  or revoked sooner.       Influenza A by PCR NEGATIVE NEGATIVE Final   Influenza B by PCR NEGATIVE NEGATIVE Final    Comment: (NOTE) The Xpert Xpress SARS-CoV-2/FLU/RSV plus assay is intended as an aid in the diagnosis of influenza from Nasopharyngeal swab specimens and should not be used as a sole basis for treatment. Nasal washings and aspirates are unacceptable for Xpert Xpress SARS-CoV-2/FLU/RSV testing.  Fact Sheet for Patients: EntrepreneurPulse.com.au  Fact Sheet for Healthcare Providers: IncredibleEmployment.be  This test is not yet approved or cleared by the [place] FDA and has been authorized for detection and/or  diagnosis of SARS-CoV-2 by FDA under an Emergency Use Authorization (EUA). This EUA will remain in effect (meaning this test can be used) for the duration of the COVID-19 declaration under Section 564(b)(1) of the Act, 21 U.S.C. section 360bbb-3(b)(1), unless the authorization is terminated or revoked.  Performed at [facility], [address], [place], [place] [address]  Surgical PCR screen     Status: None   Collection Time: 10/14/20  6:37 PM   Specimen: Nasal Mucosa; Nasal Swab  Result Value Ref Range Status   MRSA, PCR NEGATIVE NEGATIVE Final   Staphylococcus aureus NEGATIVE NEGATIVE Final    Comment: (NOTE) The Xpert SA Assay (FDA approved for NASAL specimens in patients 24 years of age and older), is one component of a comprehensive surveillance program. It is not intended to diagnose infection nor to guide or monitor treatment. Performed at [facility], [place] [address] [place], [place] [address]     Radiology Studies: No results found.     Florencia Reasons, MD PhD FACP [facility]  If 7PM-7AM, please contact night-coverage [email or web address] 10/15/2020, 6:32 PM

## 2020-10-15 NOTE — Anesthesia Procedure Notes (Addendum)
Procedure Name: Intubation Date/Time: 10/15/2020 1:06 PM Performed by: [place] Lenorris Karger C, CRNA Pre-anesthesia Checklist: Patient identified, Emergency Drugs available, Suction available, Patient being monitored and Timeout performed Patient Re-evaluated:Patient Re-evaluated prior to induction Oxygen Delivery Method: Circle system utilized Preoxygenation: Pre-oxygenation with 100% oxygen Ventilation: Mask ventilation without difficulty Laryngoscope Size: Mac and 3 Grade View: Grade III Tube size: 8.0 mm Number of attempts: 1 Airway Equipment and Method: Stylet Placement Confirmation: ETT inserted through vocal cords under direct vision, positive ETCO2 and breath sounds checked- equal and bilateral Secured at: 23 cm Tube secured with: Tape Dental Injury: Teeth and Oropharynx as per pre-operative assessment  Comments: Intubation performed by Blinda Leatherwood SRNA

## 2020-10-15 NOTE — Op Note (Signed)
10/15/2020  3:45 PM  PATIENT:  Lauren Mcpherson  55 y.o. female  PRE-OPERATIVE DIAGNOSIS:  CECAL ADENOCARCINOMA, RIGHT LIVER LESION  POST-OPERATIVE DIAGNOSIS:  SAME  PROCEDURE:  Procedure(s): LAPAROSCOPIC ASSISTED RIGHT COLECTOMY, LAPAROSCOPIC LIVER BIOPSY, LAPAROSCOPIC TAP BLOCK  SURGEON:  Surgeon(s):  Greer Pickerel, MD  ASSISTANTS: Kinsinger, Arta Bruce, MD; Alferd Apa PA-C   ANESTHESIA:   general  DRAINS: Urinary Catheter (Foley)   LOCAL MEDICATIONS USED:  MARCAINE    and OTHER exparel   SPECIMEN:  Source of Specimen:  right liver lobe bx and right colon  DISPOSITION OF SPECIMEN:  PATHOLOGY  COUNTS:  YES  INDICATION FOR PROCEDURE: Patient is a 55 year old female who was admitted with nausea and vomiting abdominal pain.  She was found to have a partial small bowel obstruction with an abnormally thickened ileocecal valve as well as multiple liver lesions.  She was able to undergo colonoscopy which demonstrated a cecal mass which unfortunately came back adenocarcinoma.  Because of her brewing partial small bowel obstruction and in consultation with our colorectal surgeon they recommended proceeding with same hospitalization right colectomy because the fear would be that if she started adjuvant chemotherapy her chronically thickened ileum would worsen resulting in a complete bowel obstruction thus interrupting her chemotherapy as well as making her higher risk for surgical intervention.  I discussed risk and benefits of the procedure with the patient and her husband which are separately documented along with expected postoperative outcomes.  PROCEDURE: The patient was on scheduled prophylactic Lovenox.  She received oral antibiotics on the day before.  She had already been prepped with respect to her colonoscopy.  Informed consent was obtained.  She was brought to the operating room and placed supine on the operating room table.  General endotracheal anesthesia was established.   Sequential compression devices were placed.  A Foley catheter was placed.  Her left arm was tucked at her side with appropriate padding.  Her abdomen is prepped and draped in the usual standard surgical fashion.  She did take oral Tylenol and gabapentin prior to surgery.  Surgical timeout was performed.  I decided to gain access to her abdomen using Optiview technique.  The patient had severe truncal obesity with a BMI of 50.  I gained access in the left upper quadrant at Palmer's point.  Using a 0 degree 5 mm laparoscope advanced with all layers of the abdominal wall.  The abdominal cavity was entered and pneumoperitoneum was smoothly established.  There was no change in vital signs.  The small bowel was grossly distended.  Additional trocar was placed in the supraumbilical position.  An additional trocar was placed in the left lower quadrant all under direct visualization.  There was no signs of carcinomatosis.  We identified about a 2 cm lesion in the dome of the right lobe of the liver that was concerning for metastatic disease.  The patient was placed in slight Trendelenburg and rotated to the left.  She had an omental adhesion to the lower midline which was taken down.  I reflected the omentum up.  The small bowel was reflected up.  Identified the appendix.  The cecum was grossly thickened and firm and hard and the very distal part of the terminal ileum was also a little bit chronically thickened.  I started taken down the white line of Toldt laterally with harmonic scalpel and going up the paracolic gutter with the harmonic scalpel.  I then came below and incised the peritoneal reflection of the  pelvic inlet just below the small bowel mesentery.  At this point the patient was placed in reverse Trendelenburg.  My assistant Dr. Kieth Brightly joined [place] in the operating room this point a 5 mm trocar was placed in the right mid abdomen on direct visualization.  An assistant was required to help with tissue  manipulation because of her severe obesity.  We identified the transverse colon.  He retracted on the omentum and I retracted down on the transverse colon.  The omentum was taken off the transverse colon we got in the correct plane and with a combination of blunt dissection and harmonic scalpel took down the hepatic flexure and started immediately rolled the hepatic flexure medially.  The colon was lifted off of Gerota's fascia.  We identified the duodenum.  We continued to take down the lateral attachments as well as the retroperitoneal mobilization of the right colon.  At this point we placed the patient flat again.  It appeared that the appendix would reach the upper midline.  There is enough mobilization of the small bowel mesentery around the TI as well.  At this point we made a small supraumbilical incision extending from the 5 mm trocar up toward the xiphoid.  Subcutaneous tissue was divided with electrocautery.  Fascia was divided as well.  The abdominal cavity was entered.  A wound protector was placed.  The cecum was exteriorized.  It was a very bulky.  I decided to transect the terminal ileum about 8 cm from the ileocecal valve.  Although the bowel was dilated from instruction in the bowel wall did not appear to have any firmness or induration to it.  A small defect was made in the mesentery just next to the bowel wall.  It was then divided with a GIA 75 stapler with a blue load.  We then started taking down the mesentery in a sequential fashion with LigaSure device.  We were able to identify the transverse colon and pull it up out of the wound as well.  The ileocolonic pedicle was clamped with a Kelly and we LigaSure distally.  We continue to come up the ascending colon and hepatic flexure mesentery with LigaSure device.  The transverse colon just to the right of the middle branch of the middle colic artery was divided with the GIA 75 with a blue load.  We then placed a 2-0 silk ligature suture  underneath the clamp on the ileocolonic pedicle followed by a 2-0 silk tie.  There is no evidence of bleeding.  There was a little bit of bleeding from the transverse colon omentum and this was taking care of with several silk ligature sutures.  The specimen had been passed off the field.  We then aligned the distal small bowel to the transverse colon in a side-to-side fashion to create a functional end-to-end anastomosis.  An enterotomy was made in the small bowel on this long along with a colotomy each just in front of the staple line.  The small bowel was evacuated of enteric contents since it was dilated.  One fork of a GIA 75 stapler with a blue load was placed through the enterotomy and the colotomy.  The stapler was brought together and fired to create a common channel.  The staple line was hemostatic.  There is evidence of good blood flow to the anastomosis.  Allises were placed across the common opening and a TA set 60 stapler blue load was used to close it.  3-0 silk suture was  placed in the crotch of the anastomosis.  I then imbricated the staple line with several interrupted 3-0 silk sutures.  The mesenteric defect was left open.  The bowel was returned to the abdomen.  A Was placed on the wound protector and we went back in laparoscopically.  At this point we turned our attention to performing the right hepatic liver biopsy.  A small sliver of the anterior surface of the mass was excised with harmonic scalpel.  Hemostasis was achieved with high electrocautery.  The area was irrigated and there is no signs of additional bleeding.  A piece of surgical snow was placed over the liver biopsy site.  At this point we performed a bilateral laparoscopic tap block of Marcaine and Exparel for postoperative pain relief.  At this point the wound protector was removed, and we instituted clean protocol.  New drapes were applied along with a new suction catheter and Bovie cautery and the new tray of instruments and  all participants changed gown and gloves.  At this point I closed the fascia with the aid of the surgical PA with a #1 looped PDS 1 from above and 1 from below and tied centrally.  The wound was irrigated with saline.  The skin was reapproximated with skin staples with a few Telfa wicks interspersed between the staples.  The trochars were closed with skin staples as well.  A honeycomb dressing was placed on the supraumbilical incision and 2 x 2's and Tegaderms were placed on the trocar sites.  All needle, instrument sponge counts were correct x2.  There were no immediate complications.  The patient tolerated the procedure well.  She was transferred to the recovery room in stable condition  PLAN OF CARE:  already inpatient   PATIENT DISPOSITION:  PACU - hemodynamically stable.   Delay start of Pharmacological VTE agent (>24hrs) due to surgical blood loss or risk of bleeding:  yes  Leighton Ruff. Redmond Pulling, MD, FACS General, Bariatric, & Minimally Invasive Surgery [facility], [place]

## 2020-10-15 NOTE — Anesthesia Preprocedure Evaluation (Signed)
Anesthesia Evaluation  Patient identified by MRN, date of birth, ID band Patient awake    Reviewed: Allergy & Precautions, NPO status , Patient's Chart, lab work & pertinent test results  History of Anesthesia Complications Negative for: history of anesthetic complications  Airway Mallampati: I  TM Distance: >3 FB Neck ROM: Full    Dental  (+) Dental Advisory Given, Partial Lower, Partial Upper   Pulmonary former smoker,    Pulmonary exam normal        Cardiovascular negative cardio ROS   Rhythm:Regular     Neuro/Psych negative neurological ROS  negative psych ROS   GI/Hepatic  Liver masses on CT   SB masses on CT    Endo/Other  Morbid obesity  Renal/GU negative Renal ROS     Musculoskeletal negative musculoskeletal ROS (+)   Abdominal (+) + obese,   Peds  Hematology negative hematology ROS (+)   Anesthesia Other Findings   Reproductive/Obstetrics                            Anesthesia Physical  Anesthesia Plan  ASA: 3  Anesthesia Plan: General   Post-op Pain Management:    Induction: Intravenous  PONV Risk Score and Plan: 2 and Treatment may vary due to age or medical condition, Ondansetron and Dexamethasone  Airway Management Planned: Oral ETT  Additional Equipment: None  Intra-op Plan:   Post-operative Plan: Extubation in OR  Informed Consent: I have reviewed the patients History and Physical, chart, labs and discussed the procedure including the risks, benefits and alternatives for the proposed anesthesia with the patient or authorized representative who has indicated his/her understanding and acceptance.     Dental advisory given  Plan Discussed with: CRNA  Anesthesia Plan Comments:         Anesthesia Quick Evaluation

## 2020-10-15 NOTE — Transfer of Care (Signed)
Immediate Anesthesia Transfer of Care Note  Patient: Lauren Mcpherson  Procedure(s) Performed: LAPAROSCOPIC RIGHT COLECTOMY, LAPAROSCOPIC LIVER BIOPSY, LAPAROSCOPIC TAP BLOCK (Right)  Patient Location: PACU  Anesthesia Type:General  Level of Consciousness: awake, alert  and oriented  Airway & Oxygen Therapy: Patient Spontanous Breathing and Patient connected to face mask oxygen  Post-op Assessment: Report given to RN and Post -op Vital signs reviewed and stable  Post vital signs: Reviewed and stable  Last Vitals:  Vitals Value Taken Time  BP    Temp    Pulse 79 10/15/20 1558  Resp 22 10/15/20 1558  SpO2 98 % 10/15/20 1558  Vitals shown include unvalidated device data.  Last Pain:  Vitals:   10/15/20 1205  TempSrc: Oral  PainSc:          Complications: No notable events documented.

## 2020-10-15 NOTE — Progress Notes (Signed)
2 Days Post-Op  Subjective: CC: No abdominal pain this morning.  Had some vomiting last night.  Nausea resolved this morning.  She reports she passed some flatus and had a liquidy BM this morning.  Awaiting surgery.  Objective: Vital signs in last 24 hours: Temp:  [97.7 F (36.5 C)-97.9 F (36.6 C)] 97.7 F (36.5 C) (06/24 0637) Pulse Rate:  [70-102] 102 (06/24 0637) Resp:  [16-18] 18 (06/24 0637) BP: (113-149)/(86-96) 113/86 (06/24 0637) SpO2:  [93 %-98 %] 93 % (06/24 0637) Last BM Date: 10/15/20  Intake/Output from previous day: 06/23 0701 - 06/24 0700 In: 1282.6 [P.O.:360; I.V.:922.6] Out: 300 [Emesis/NG output:300] Intake/Output this shift: No intake/output data recorded.  PE: Gen:  Alert, NAD, pleasant HEENT: EOM's intact, pupils equal and round Card:  RRR, no M/G/R heard Pulm:  CTAB, no W/R/R, effort normal Abd: Soft, mild distension, NT, +BS Ext:  No LE edema  Psych: A&Ox3 Skin: no rashes noted, warm and dry  Lab Results:  Recent Labs    10/15/20 0409  WBC 5.7  HGB 13.2  HCT 42.3  PLT 287   BMET Recent Labs    10/14/20 0507 10/15/20 0409  NA 138 139  K 3.5 4.0  CL 105 105  CO2 25 24  GLUCOSE 91 109*  BUN <5* <5*  CREATININE 0.73 0.81  CALCIUM 8.4* 8.9   PT/INR Recent Labs    10/13/20 0312  LABPROT 13.5  INR 1.0   CMP     Component Value Date/Time   NA 139 10/15/2020 0409   K 4.0 10/15/2020 0409   CL 105 10/15/2020 0409   CO2 24 10/15/2020 0409   GLUCOSE 109 (H) 10/15/2020 0409   BUN <5 (L) 10/15/2020 0409   CREATININE 0.81 10/15/2020 0409   CALCIUM 8.9 10/15/2020 0409   PROT 6.4 (L) 10/12/2020 0344   ALBUMIN 3.0 (L) 10/12/2020 0344   AST 39 10/12/2020 0344   ALT 51 (H) 10/12/2020 0344   ALKPHOS 64 10/12/2020 0344   BILITOT 0.5 10/12/2020 0344   GFRNONAA >60 10/15/2020 0409   Lipase     Component Value Date/Time   LIPASE 23 10/10/2020 1511       Studies/Results: CT CHEST W CONTRAST  Result Date:  10/14/2020 CLINICAL DATA:  Metastatic colon cancer.  Staging CT scan. EXAM: CT CHEST WITH CONTRAST TECHNIQUE: Multidetector CT imaging of the chest was performed during intravenous contrast administration. CONTRAST:  11mL OMNIPAQUE IOHEXOL 300 MG/ML  SOLN COMPARISON:  CT scan 10/10/2020 FINDINGS: Cardiovascular: The heart is normal in size. No pericardial effusion. The aorta is normal in caliber. No dissection. No atherosclerotic calcifications. The branch vessels are patent. No definite coronary artery calcifications. Mediastinum/Nodes: No mediastinal or hilar mass or lymphadenopathy. The esophagus is unremarkable. Lungs/Pleura: The lungs are clear of an acute process. No infiltrates, edema or effusions. 4 mm nodule noted in the right lower lobe on image number 72/5. This is an indeterminate finding but I do not see any other pulmonary nodules to suggest pulmonary metastatic disease. Recommend attention on future/follow-up scans. Upper Abdomen: Liver lesions are not well demonstrated on this study. The gallbladder is surgically absent. No common bile duct dilatation. Stable low-attenuation cystic appearing lesion in the left upper quadrant could be projecting off the cardial region of the stomach or possibly off the upper aspect of the adrenal gland. It measures 5 Hounsfield units and is likely a benign cyst. No upper abdominal adenopathy. Musculoskeletal: No breast masses, supraclavicular or axillary adenopathy. No  significant bony findings. IMPRESSION: 1. Single 4 mm right lower lobe pulmonary nodule, likely benign but indeterminate. Attention on future scans is suggested. 2. No mediastinal or hilar mass or adenopathy. 3. Liver lesions are not well demonstrated on this study. 4. Stable low-attenuation cystic appearing lesion in the left upper quadrant, likely a benign cyst. Electronically Signed   By: Marijo Sanes M.D.   On: 10/14/2020 16:16   IR ABDOMEN US LIMITED  Result Date: 10/13/2020 CLINICAL DATA:   Concern for metastatic colon cancer, now with multiple liver lesions worrisome for metastatic disease. Please perform ultrasound-guided liver lesion biopsy for tissue diagnostic purposes. EXAM: ULTRASOUND ABDOMEN LIMITED COMPARISON:  Abdominal MRI-10/11/2020; CT abdomen pelvis-10/10/2020 FINDINGS: Exhaustive sonographic evaluation performed by the dictating interventional radiologist failed to confidently replicate any of the worrisome liver lesions demonstrated on preceding cross-sectional imaging. As such, biopsy was deferred at this time. IMPRESSION: Non visualization of worrisome liver lesions potentially at least partially attributable to patient body habitus and hepatic steatosis. No biopsy attempted. PLAN: - Recommend waiting for results of endoscopic biopsy performed earlier today. - Ultimately, further evaluation could be performed with PET-CT imaging as indicated. - Note, if tissue diagnosis is still required, would recommend attempting liver lesion biopsy with CT guidance as indicated, though this too likely will be challenging secondary to patient body habitus and central location of both dominant liver lesions. Above discussed with Dr. Cristina Gong (GI) at the time of procedure completion. Electronically Signed   By: Sandi Mariscal M.D.   On: 10/13/2020 16:17    Anti-infectives: Anti-infectives (From admission, onward)    Start     Dose/Rate Route Frequency Ordered Stop   10/15/20 1400  cefoTEtan (CEFOTAN) 2 g in sodium chloride 0.9 % 100 mL IVPB        2 g 200 mL/hr over 30 Minutes Intravenous On call to O.R. 10/14/20 1626 10/16/20 0559   10/14/20 1700  neomycin (MYCIFRADIN) tablet 1,000 mg       See Hyperspace for full Linked Orders Report.   1,000 mg Oral 3 times per day 10/14/20 1626 10/15/20 0017   10/14/20 1700  metroNIDAZOLE (FLAGYL) tablet 1,000 mg       See Hyperspace for full Linked Orders Report.   1,000 mg Oral 3 times per day 10/14/20 1626 10/15/20 0016   10/11/20 0100  cefTRIAXone  (ROCEPHIN) 1 g in sodium chloride 0.9 % 100 mL IVPB  Status:  Discontinued        1 g 200 mL/hr over 30 Minutes Intravenous Every 24 hours 10/11/20 0003 10/11/20 1138        Assessment/Plan Cecal Mass - adenocarcinoma  Liver lesions Elevated CEA - CT w/ CT scan which shows SBO to the level of the terminal ileum; thickening of the terminal ileum, soft tissue mass cannot be excluded; several ill-defined hypoattenuated masses throughout the liver, indeterminate. - MRI Multiple liver lesions concerning for metastatic disease as well as partially imaged terminal ileal thickening with increased mural enhancement and numerous prominent but nonenlarged adjacent ileocolic lymph nodes. IR unable to biopsy lesions (note 6/22) - CEA 37.7 - Colonoscopy 6/22 showed frond-like/villous medium-sized non-circumferential mass in the cecum. No bleeding present. Bx with adenocarcinoma. There was also a 66mm polyp at transverse colon and 12mm polyp at splenic flexure - both which came back as tubular adenoma without high grade dysplasia or carcinoma.  - CT chest w/ 4 mm R LLL pulmonary nodule suspected to be benign but will need close follow-up.  There is  no mediastinal or hilar mass or lymphadenopathy. - Plan for laparoscopic assisted right colectomy today with possible liver biopsy. This will not be for cure but just to try to relieve the partial blockage to prevent complications during her chemotherapy     ID - Cefotetan periop VTE - SCDs, lovenox FEN - IVF, None Foley - none Follow up - TBD   Obesity BMI 50.49  To note, patient had abnormal mammogram and ultrasound of the right breast in April/May. There is no palpable masses/lumps or lymph nodes on exam. This may need additional imaging. Results as below. Mammogram 08/04/20: Further evaluation is suggested for a possible mass in the right breast.  Diagnostic Mammogram/R breast US 08/26/20: Intramammary lymph node (0.7 x 1 x 1.1 cm) over the 10 o'clock  position of the right breast 12 cm from the nipple accounting for the mammographic finding with borderline cortical thickening as this likely represents a benign reactive lymph node. Ultrasound the right axilla demonstrates a few similar appearing lymph nodes with cortical thickness between 3-4 mm   LOS: 5 days    Jillyn Ledger , [facility] 10/15/2020, 8:27 AM Please see Amion for pager number during day hours 7:00am-4:30pm

## 2020-10-16 DIAGNOSIS — E876 Hypokalemia: Secondary | ICD-10-CM

## 2020-10-16 DIAGNOSIS — C180 Malignant neoplasm of cecum: Secondary | ICD-10-CM

## 2020-10-16 LAB — BASIC METABOLIC PANEL
Anion gap: 6 (ref 5–15)
BUN: <5 mg/dL — ABNORMAL LOW (ref 6–20)
CO2: 27 mmol/L (ref 22–32)
Calcium: 8.2 mg/dL — ABNORMAL LOW (ref 8.9–10.3)
Chloride: 100 mmol/L (ref 98–111)
Creatinine, Ser: 0.71 mg/dL (ref 0.44–1.00)
GFR, Estimated: >60 mL/min (ref >60)
Glucose, Bld: 122 mg/dL — ABNORMAL HIGH (ref 70–99)
Potassium: 3.5 mmol/L (ref 3.5–5.1)
Sodium: 133 mmol/L — ABNORMAL LOW (ref 135–145)

## 2020-10-16 LAB — CBC
HCT: 39.9 % (ref 36.0–46.0)
Hemoglobin: 13 g/dL (ref 12.0–15.0)
MCH: 30.7 pg (ref 26.0–34.0)
MCHC: 32.6 g/dL (ref 30.0–36.0)
MCV: 94.1 fL (ref 80.0–100.0)
Platelets: 298 10*3/uL (ref 150–400)
RBC: 4.24 MIL/uL (ref 3.87–5.11)
RDW: 15.2 % (ref 11.5–15.5)
WBC: 11.4 10*3/uL — ABNORMAL HIGH (ref 4.0–10.5)
nRBC: 0 % (ref 0.0–0.2)

## 2020-10-16 LAB — MAGNESIUM: Magnesium: 1.6 mg/dL — ABNORMAL LOW (ref 1.7–2.4)

## 2020-10-16 MED ORDER — SODIUM CHLORIDE 0.9% FLUSH: 3.0000 mL | INTRAVENOUS | Status: DC | PRN

## 2020-10-16 MED ORDER — LIP MEDEX EX OINT
1.0000 "application " | TOPICAL_OINTMENT | Freq: Two times a day (BID) | CUTANEOUS | Status: DC
  Administered 2020-10-16 – 2020-10-18 (×5): 1 via TOPICAL

## 2020-10-16 MED ORDER — TRAMADOL HCL 50 MG PO TABS: 50.0000 mg | ORAL_TABLET | Freq: Four times a day (QID) | ORAL | Status: DC | PRN

## 2020-10-16 MED ORDER — ENALAPRILAT 1.25 MG/ML IV SOLN
0.6250 mg | Freq: Four times a day (QID) | INTRAVENOUS | Status: DC | PRN
  Filled 2020-10-16: qty 1

## 2020-10-16 MED ORDER — SIMETHICONE 40 MG/0.6ML PO SUSP
80.0000 mg | Freq: Four times a day (QID) | ORAL | Status: DC | PRN
  Filled 2020-10-16: qty 1.2

## 2020-10-16 MED ORDER — METHOCARBAMOL 1000 MG/10ML IJ SOLN
1000.0000 mg | Freq: Four times a day (QID) | INTRAVENOUS | Status: DC | PRN
  Filled 2020-10-16: qty 10

## 2020-10-16 MED ORDER — GABAPENTIN 300 MG PO CAPS
300.0000 mg | ORAL_CAPSULE | Freq: Every day | ORAL | Status: DC
  Administered 2020-10-16 – 2020-10-17 (×2): 300 mg via ORAL
  Filled 2020-10-16 (×2): qty 1

## 2020-10-16 MED ORDER — SODIUM CHLORIDE 0.9% FLUSH
3.0000 mL | Freq: Two times a day (BID) | INTRAVENOUS | Status: DC
  Administered 2020-10-16 – 2020-10-17 (×4): 3 mL via INTRAVENOUS

## 2020-10-16 MED ORDER — METHOCARBAMOL 500 MG PO TABS: 1000.0000 mg | ORAL_TABLET | Freq: Four times a day (QID) | ORAL | Status: DC | PRN

## 2020-10-16 MED ORDER — LACTATED RINGERS IV BOLUS: 1000.0000 mL | Freq: Three times a day (TID) | INTRAVENOUS | Status: AC | PRN

## 2020-10-16 MED ORDER — MAGIC MOUTHWASH
15.0000 mL | Freq: Four times a day (QID) | ORAL | Status: DC | PRN
  Filled 2020-10-16: qty 15

## 2020-10-16 MED ORDER — CALCIUM POLYCARBOPHIL 625 MG PO TABS
625.0000 mg | ORAL_TABLET | Freq: Two times a day (BID) | ORAL | Status: DC
  Administered 2020-10-16 – 2020-10-18 (×5): 625 mg via ORAL
  Filled 2020-10-16 (×5): qty 1

## 2020-10-16 MED ORDER — MAGNESIUM SULFATE 4 GM/100ML IV SOLN
4.0000 g | Freq: Once | INTRAVENOUS | Status: AC
  Administered 2020-10-16: 4 g via INTRAVENOUS
  Filled 2020-10-16: qty 100

## 2020-10-16 MED ORDER — SODIUM CHLORIDE 0.9 % IV SOLN: 250.0000 mL | INTRAVENOUS | Status: DC | PRN

## 2020-10-16 MED ORDER — POTASSIUM CHLORIDE CRYS ER 20 MEQ PO TBCR
40.0000 meq | EXTENDED_RELEASE_TABLET | Freq: Every day | ORAL | Status: AC
  Administered 2020-10-16 – 2020-10-18 (×3): 40 meq via ORAL
  Filled 2020-10-16 (×3): qty 2

## 2020-10-16 NOTE — Progress Notes (Signed)
Lauren Mcpherson [id number] 01-Oct-1965  CARE TEAM:  PCP: Pcp, No  Outpatient Care Team: Patient Care Team: Pcp, No as PCP - General  Inpatient Treatment Team: Treatment Team: Attending Provider: Florencia Reasons, MD; Rounding Team: Nolon Nations, MD; Consulting Physician: Ronald Lobo, MD; Rounding Team: Fatima Blank, MD; Technician: Ernest Mallick, NT; Social Worker: Evans Lance; Registered Nurse: Kurtis Bushman, RN; Registered Nurse: Dorinda Hill, RN; Physical Therapist: Mathis Fare, PT; Utilization Review: Conception Oms, RN; Occupational Therapist: [name], OT   Problem List:   Principal Problem:   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  Advance diet.  Follow-up on pathology.  Correct hypokalemia and hypomagnesemia.  VTE prophylaxis- SCDs, etc  mobilize as tolerated to help recovery  Disposition:  Disposition:  The patient is from: Home  Anticipate discharge to:  Home  Anticipated Date of Discharge is:  June 28,2022    Barriers to discharge:  Pending Clinical improvement (more likely than not)  Patient currently is NOT MEDICALLY STABLE for discharge from the hospital from a surgery standpoint.      20 minutes spent in review, evaluation, examination, counseling, and coordination of care.   I have reviewed this patient's available  data, including medical history, events of note, physical examination and test results as part of my evaluation.  A significant portion of that time was spent in counseling.  Care during the described time interval was provided by me.  10/16/2020    Subjective: (Chief complaint)  Patient set up in chair.  He walked in hallways.  Denies much pain.  Tolerating liquids.  Objective:  Vital signs:  Vitals:   10/15/20 2056 10/16/20 0235 10/16/20 0500 10/16/20 0531  BP: (!) 156/92 (!) 145/98  132/82  Pulse: 69 76  74  Resp: 17 17  18   Temp: 97.6 F (36.4 C) 98.2 F (36.8 C)  98.1 F (36.7 C)  TempSrc: Oral Oral  Oral  SpO2: 97% 98%  98%  Weight:   134.4 kg   Height:        Last BM Date: 10/15/20  Intake/Output   Yesterday:  06/24 0701 - 06/25 0700 In: 2981.1 [P.O.:100; I.V.:2281.1; IV Piggyback:600] Out: 1430 [Urine:1380; Blood:50] This shift:  No intake/output data recorded.  Bowel function:  Flatus: YES  BM:  No  Drain: (No drain)   Physical Exam:  General: Pt awake/alert in no acute distress.  Sitting up in chair calm and relaxed. Eyes: PERRL, normal EOM.  Sclera clear.  No icterus Neuro: CN II-XII intact w/o focal sensory/motor deficits. Lymph: No head/neck/groin lymphadenopathy Psych:  No delerium/psychosis/paranoia.  Oriented x 4 HENT: Normocephalic, Mucus membranes moist.  No thrush Neck: Supple, No tracheal deviation.  No obvious thyromegaly Chest: No pain to chest wall compression.  Good respiratory excursion.  No audible wheezing CV:  Pulses intact.  Regular rhythm.  No major extremity edema MS: Normal  AROM mjr joints.  No obvious deformity  Abdomen: Soft.  Mildy distended.  Mildly tender at incisions only.  No evidence of peritonitis.  No incarcerated hernias.  Ext:  No deformity.  No mjr edema.  No cyanosis Skin: No petechiae / purpurea.  No major sores.  Warm and dry    Results:   Cultures: Recent Results (from the past 720 hour(s))   Resp Panel by RT-PCR (Flu A&B, Covid) Nasopharyngeal Swab     Status: None   Collection Time: 10/10/20  5:18 PM   Specimen: Nasopharyngeal Swab; Nasopharyngeal(NP) swabs in vial transport medium  Result Value Ref Range Status   SARS Coronavirus 2 by RT PCR NEGATIVE NEGATIVE Final    Comment: (NOTE) SARS-CoV-2 target nucleic acids are NOT DETECTED.  The SARS-CoV-2 RNA is generally detectable in upper respiratory specimens during the acute phase of infection. The lowest concentration of SARS-CoV-2 viral copies this assay can detect is 138 copies/mL. A negative result does not preclude SARS-Cov-2 infection and should not be used as the sole basis for treatment or other patient management decisions. A negative result may occur with  improper specimen collection/handling, submission of specimen other than nasopharyngeal swab, presence of viral mutation(s) within the areas targeted by this assay, and inadequate number of viral copies(<138 copies/mL). A negative result must be combined with clinical observations, patient history, and epidemiological information. The expected result is Negative.  Fact Sheet for Patients:  EntrepreneurPulse.com.au  Fact Sheet for Healthcare Providers:  IncredibleEmployment.be  This test is no t yet approved or cleared by the [place] FDA and  has been authorized for detection and/or diagnosis of SARS-CoV-2 by FDA under an Emergency Use Authorization (EUA). This EUA will remain  in effect (meaning this test can be used) for the duration of the COVID-19 declaration under Section 564(b)(1) of the Act, 21 U.S.C.section 360bbb-3(b)(1), unless the authorization is terminated  or revoked sooner.       Influenza A by PCR NEGATIVE NEGATIVE Final   Influenza B by PCR NEGATIVE NEGATIVE Final    Comment: (NOTE) The Xpert Xpress SARS-CoV-2/FLU/RSV plus assay is intended as an aid in the diagnosis of influenza from  Nasopharyngeal swab specimens and should not be used as a sole basis for treatment. Nasal washings and aspirates are unacceptable for Xpert Xpress SARS-CoV-2/FLU/RSV testing.  Fact Sheet for Patients: EntrepreneurPulse.com.au  Fact Sheet for Healthcare Providers: IncredibleEmployment.be  This test is not yet approved or cleared by the [place] FDA and has been authorized for detection and/or diagnosis of SARS-CoV-2 by FDA under an Emergency Use Authorization (EUA). This EUA will remain in effect (meaning this test can be used) for the duration of the COVID-19 declaration under Section 564(b)(1) of the Act, 21 U.S.C. section 360bbb-3(b)(1), unless the authorization is terminated or revoked.  Performed at [facility], [address] [place], [place] [address]   Surgical PCR screen     Status: None   Collection Time: 10/14/20  6:37 PM   Specimen: Nasal Mucosa; Nasal Swab  Result Value Ref Range Status   MRSA, PCR NEGATIVE NEGATIVE Final   Staphylococcus aureus NEGATIVE NEGATIVE Final    Comment: (NOTE) The Xpert SA Assay (FDA approved for NASAL specimens in patients 68 years of age and older), is one component of a comprehensive surveillance program. It is not intended to diagnose infection nor to guide or monitor treatment. Performed at [facility], [place] [address] [place], [place] [address]  Labs: Results for orders placed or performed during the hospital encounter of 10/10/20 (from the past 48 hour(s))  Type and screen [place]     Status: None   Collection Time: 10/14/20  5:36 PM  Result Value Ref Range   ABO/RH(D) B POS    Antibody Screen NEG    Sample Expiration      10/17/2020,2359 Performed at [facility], [place] [address], [place], [place] [address]   Surgical PCR screen     Status: None   Collection Time: 10/14/20  6:37 PM   Specimen: Nasal  Mucosa; Nasal Swab  Result Value Ref Range   MRSA, PCR NEGATIVE NEGATIVE   Staphylococcus aureus NEGATIVE NEGATIVE    Comment: (NOTE) The Xpert SA Assay (FDA approved for NASAL specimens in patients 1 years of age and older), is one component of a comprehensive surveillance program. It is not intended to diagnose infection nor to guide or monitor treatment. Performed at [facility], [place] [address], [place], [place] [address]   Basic metabolic panel     Status: Abnormal   Collection Time: 10/15/20  4:09 AM  Result Value Ref Range   Sodium 139 135 - 145 mmol/L   Potassium 4.0 3.5 - 5.1 mmol/L   Chloride 105 98 - 111 mmol/L   CO2 24 22 - 32 mmol/L   Glucose, Bld 109 (H) 70 - 99 mg/dL    Comment: Glucose reference range applies only to samples taken after fasting for at least 8 hours.   BUN <5 (L) 6 - 20 mg/dL   Creatinine, Ser 0.81 0.44 - 1.00 mg/dL   Calcium 8.9 8.9 - 10.3 mg/dL   GFR, Estimated >60 >60 mL/min    Comment: (NOTE) Calculated using the CKD-EPI Creatinine Equation (2021)    Anion gap 10 5 - 15    Comment: Performed at [facility], [place] [address] [place], [place] [address]  Prealbumin     Status: Abnormal   Collection Time: 10/15/20  4:09 AM  Result Value Ref Range   Prealbumin 12.6 (L) 18 - 38 mg/dL    Comment: Performed at [facility], [place] [address] [place], [place] [address]  CBC     Status: None   Collection Time: 10/15/20  4:09 AM  Result Value Ref Range   WBC 5.7 4.0 - 10.5 K/uL   RBC 4.38 3.87 - 5.11 MIL/uL   Hemoglobin 13.2 12.0 - 15.0 g/dL   HCT 42.3 36.0 - 46.0 %   MCV 96.6 80.0 - 100.0 fL   MCH 30.1 26.0 - 34.0 pg   MCHC 31.2 30.0 - 36.0 g/dL   RDW 15.2 11.5 - 15.5 %   Platelets 287 150 - 400 K/uL   nRBC 0.0 0.0 - 0.2 %    Comment: Performed at [facility], [place] [address] [place], [place] [address]  Magnesium     Status: None   Collection Time: 10/15/20  4:09 AM   Result Value Ref Range   Magnesium 1.7 1.7 - 2.4 mg/dL    Comment: Performed at [facility] [address], [place] [address], [place],  [address]  CBC     Status: Abnormal   Collection Time: 10/16/20  5:14 AM  Result Value Ref Range   WBC 11.4 (H) 4.0 - 10.5 K/uL   RBC 4.24 3.87 - 5.11 MIL/uL   Hemoglobin 13.0 12.0 - 15.0 g/dL   HCT 39.9 36.0 -  46.0 %   MCV 94.1 80.0 - 100.0 fL   MCH 30.7 26.0 - 34.0 pg   MCHC 32.6 30.0 - 36.0 g/dL   RDW 15.2 11.5 - 15.5 %   Platelets 298 150 - 400 K/uL   nRBC 0.0 0.0 - 0.2 %    Comment: Performed at [facility], [place] [address], [place], [place] [address]  Basic metabolic panel     Status: Abnormal   Collection Time: 10/16/20  5:14 AM  Result Value Ref Range   Sodium 133 (L) 135 - 145 mmol/L   Potassium 3.5 3.5 - 5.1 mmol/L   Chloride 100 98 - 111 mmol/L   CO2 27 22 - 32 mmol/L   Glucose, Bld 122 (H) 70 - 99 mg/dL    Comment: Glucose reference range applies only to samples taken after fasting for at least 8 hours.   BUN <5 (L) 6 - 20 mg/dL   Creatinine, Ser 0.71 0.44 - 1.00 mg/dL   Calcium 8.2 (L) 8.9 - 10.3 mg/dL   GFR, Estimated >60 >60 mL/min    Comment: (NOTE) Calculated using the CKD-EPI Creatinine Equation (2021)    Anion gap 6 5 - 15    Comment: Performed at [facility], [place] [address] [place], [place] [address]  Magnesium     Status: Abnormal   Collection Time: 10/16/20  5:14 AM  Result Value Ref Range   Magnesium 1.6 (L) 1.7 - 2.4 mg/dL    Comment: Performed at [facility], [place] [address], [place], [place] [address]    Imaging / Studies: CT CHEST W CONTRAST  Result Date: 10/14/2020 CLINICAL DATA:  Metastatic colon cancer.  Staging CT scan. EXAM: CT CHEST WITH CONTRAST TECHNIQUE: Multidetector CT imaging of the chest was performed during intravenous contrast administration. CONTRAST:  55mL OMNIPAQUE IOHEXOL 300 MG/ML  SOLN COMPARISON:  CT scan 10/10/2020  FINDINGS: Cardiovascular: The heart is normal in size. No pericardial effusion. The aorta is normal in caliber. No dissection. No atherosclerotic calcifications. The branch vessels are patent. No definite coronary artery calcifications. Mediastinum/Nodes: No mediastinal or hilar mass or lymphadenopathy. The esophagus is unremarkable. Lungs/Pleura: The lungs are clear of an acute process. No infiltrates, edema or effusions. 4 mm nodule noted in the right lower lobe on image number 72/5. This is an indeterminate finding but I do not see any other pulmonary nodules to suggest pulmonary metastatic disease. Recommend attention on future/follow-up scans. Upper Abdomen: Liver lesions are not well demonstrated on this study. The gallbladder is surgically absent. No common bile duct dilatation. Stable low-attenuation cystic appearing lesion in the left upper quadrant could be projecting off the cardial region of the stomach or possibly off the upper aspect of the adrenal gland. It measures 5 Hounsfield units and is likely a benign cyst. No upper abdominal adenopathy. Musculoskeletal: No breast masses, supraclavicular or axillary adenopathy. No significant bony findings. IMPRESSION: 1. Single 4 mm right lower lobe pulmonary nodule, likely benign but indeterminate. Attention on future scans is suggested. 2. No mediastinal or hilar mass or adenopathy. 3. Liver lesions are not well demonstrated on this study. 4. Stable low-attenuation cystic appearing lesion in the left upper quadrant, likely a benign cyst. Electronically Signed   By: Marijo Sanes M.D.   On: 10/14/2020 16:16    Medications / Allergies: per chart  Antibiotics: Anti-infectives (From admission, onward)    Start     Dose/Rate Route Frequency Ordered Stop   10/15/20 1400  cefoTEtan (CEFOTAN) 2 g in sodium chloride 0.9 % 100 mL IVPB        2 g 200 mL/hr over 30 Minutes Intravenous On call to O.R. 10/14/20 1626 10/15/20 1308   10/14/20 1700  neomycin  (MYCIFRADIN) tablet 1,000 mg       See Hyperspace for full Linked Orders Report.   1,000 mg Oral 3 times per day 10/14/20 1626 10/15/20 0017   10/14/20 1700  metroNIDAZOLE (FLAGYL) tablet 1,000 mg       See Hyperspace for full Linked Orders Report.   1,000 mg Oral 3 times per day 10/14/20 1626 10/15/20 0016   10/11/20 0100  cefTRIAXone (ROCEPHIN) 1 g in sodium chloride 0.9 % 100 mL IVPB  Status:  Discontinued        1 g 200 mL/hr over 30 Minutes Intravenous Every 24 hours 10/11/20 0003 10/11/20 1138         Note: Portions of this report may have been transcribed using voice recognition software. Every effort was made to ensure accuracy; however, inadvertent computerized transcription errors may be present.   Any transcriptional errors that result from this process are unintentional.    Adin Hector, MD, FACS, MASCRS Esophageal, Gastrointestinal & Colorectal Surgery Robotic and Minimally Invasive Surgery  [facility] [address] [place], [place] [address] [phone] Fax [phone] Main/Paging  CONTACT INFORMATION: Weekday (9AM-5PM) concerns: Call [facility] main office at [phone] Weeknight (5PM-9AM) or Weekend/Holiday concerns: Check [email or web address] for General Surgery [facility] coverage (Please, do not use SecureChat as it is not reliable communication to operating surgeons for immediate patient care)      10/16/2020  8:59 AM

## 2020-10-16 NOTE — Evaluation (Signed)
Occupational Therapy Evaluation Patient Details Name: Lauren Mcpherson MRN: [id number] DOB: 1965-10-21 Today's Date: 10/16/2020    History of Present Illness Pt s/p laparoscopic assisted R colectomy and liver biopsy 2* cecal adenocarcinoma and liver lesion.   Clinical Impression   Patient evaluated by Occupational Therapy with no further acute OT needs identified. All education has been completed and the patient has no further questions.  See below for any follow-up Occupational Therapy or equipment needs. OT is signing off. Thank you for this referral.     Follow Up Recommendations  No OT follow up    Equipment Recommendations  Other (comment) (Recommended long bath brush/sponge to avoid having to lean forward at waist when bathing for pain management.)    Recommendations for Other Services       Precautions / Restrictions Precautions Precautions: Fall Restrictions Weight Bearing Restrictions: No      Mobility Bed Mobility Overal bed mobility: Needs Assistance Bed Mobility: Supine to Sit     Supine to sit: Supervision;Min guard     General bed mobility comments: cues for log roll technique    Transfers Overall transfer level: Needs assistance Equipment used: None Transfers: Sit to/from Stand Sit to Stand: Min guard;Supervision         General transfer comment: for safety    Balance Overall balance assessment: Needs assistance Sitting-balance support: No upper extremity supported;Feet supported Sitting balance-Leahy Scale: Good     Standing balance support: No upper extremity supported Standing balance-Leahy Scale: Fair                             ADL either performed or assessed with clinical judgement   ADL Overall ADL's : At baseline                                       General ADL Comments: Pt able to demonstrate her basic ADLs and functional mobility at a supervision to Modified independent level. :Encouraged pt's use  of reacher to avoid bending at waist for pain management.      Vision Baseline Vision/History: Wears glasses Wears Glasses: Reading only Patient Visual Report: No change from baseline Vision Assessment?: No apparent visual deficits     Perception     Praxis      Pertinent Vitals/Pain Pain Assessment: 0-10 Pain Score: 3  Pain Location: abdomen Pain Descriptors / Indicators: Sore Pain Intervention(s): Limited activity within patient's tolerance;Monitored during session     Hand Dominance Right   Extremity/Trunk Assessment Upper Extremity Assessment Upper Extremity Assessment: Overall WFL for tasks assessed   Lower Extremity Assessment Lower Extremity Assessment: Overall WFL for tasks assessed       Communication Communication Communication: No difficulties   Cognition Arousal/Alertness: Awake/alert Behavior During Therapy: WFL for tasks assessed/performed Overall Cognitive Status: Within Functional Limits for tasks assessed                                     General Comments       Exercises     Shoulder Instructions      Home Living Family/patient expects to be discharged to:: Private residence Living Arrangements: Spouse/significant other Available Help at Discharge: Family Type of Home: House Home Access: Stairs to enter [employer] of Steps: 1 Entrance Stairs-Rails:  Right Home Layout: One level     Bathroom Shower/Tub: Tub/shower unit   Bathroom Toilet: Handicapped height     Home Equipment: Hand held shower head;Adaptive equipment Adaptive Equipment: Reacher        Prior Functioning/Environment Level of Independence: Independent        Comments: 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        OT Problem List: Pain      OT Treatment/Interventions:      OT Goals(Current goals can be found in the care plan section) Acute Rehab OT Goals Patient Stated Goal: Regain IND OT Goal Formulation: With  patient Potential to Achieve Goals: Good  OT Frequency:     Barriers to D/C:            Co-evaluation              AM-PAC OT "6 Clicks" Daily Activity     Outcome Measure Help from another person eating meals?: None Help from another person taking care of personal grooming?: None Help from another person toileting, which includes using toliet, bedpan, or urinal?: None Help from another person bathing (including washing, rinsing, drying)?: A Little Help from another person to put on and taking off regular upper body clothing?: None Help from another person to put on and taking off regular lower body clothing?: None 6 Click Score: 23   End of Session Nurse Communication: Mobility status  Activity Tolerance: Patient tolerated treatment well Patient left: in chair;with call bell/phone within reach  OT Visit Diagnosis: Pain Pain - part of body:  (ABD)                Time: 1916-6060 OT Time Calculation (min): 18 min Charges:  OT General Charges $OT Visit: 1 Visit OT Evaluation $OT Eval Low Complexity: 1 Low  Lauren Mcpherson, OT Acute Rehab Services Office: [phone] 10/16/2020  Lauren Mcpherson 10/16/2020, 11:47 AM

## 2020-10-16 NOTE — Evaluation (Signed)
Physical Therapy Evaluation Patient Details Name: Lauren Mcpherson MRN: [id number] DOB: [birth year] Today's Date: 10/16/2020   History of Present Illness  Pt s/p laparoscopic assisted R colectomy and liver biopsy 2* cecal adenocarcinoma and liver lesion.  Clinical Impression  Pt admitted as above and presenting with functional mobility limitations 2* post op abdominal pain and mild ambulatory balance deficits.  Pt should progress to dc home with family assist.    Follow Up Recommendations No PT follow up    Equipment Recommendations  None recommended by PT    Recommendations for Other Services       Precautions / Restrictions Precautions Precautions: Fall Restrictions Weight Bearing Restrictions: No      Mobility  Bed Mobility Overal bed mobility: Needs Assistance Bed Mobility: Supine to Sit     Supine to sit: Supervision;Min guard     General bed mobility comments: cues for log roll technique    Transfers Overall transfer level: Needs assistance Equipment used: None Transfers: Sit to/from Stand Sit to Stand: Min guard;Supervision         General transfer comment: for safety  Ambulation/Gait Ambulation/Gait assistance: Min guard;Supervision Gait Distance (Feet): 400 Feet Assistive device: IV Pole;None Gait Pattern/deviations: Step-through pattern;Decreased step length - right;Decreased step length - left;Shuffle;Wide base of support     General Gait Details: Pt with noted mild instability initially ambulating sans AD but progressed well with hands on IV pump  Stairs            Wheelchair Mobility    Modified Rankin (Stroke Patients Only)       Balance Overall balance assessment: Needs assistance Sitting-balance support: No upper extremity supported;Feet supported Sitting balance-Leahy Scale: Good     Standing balance support: No upper extremity supported Standing balance-Leahy Scale: Fair                                Pertinent Vitals/Pain Pain Assessment: 0-10 Pain Score: 3  Pain Location: abdomen Pain Descriptors / Indicators: Sore Pain Intervention(s): Limited activity within patient's tolerance;Monitored during session    Home Living Family/patient expects to be discharged to:: Private residence Living Arrangements: Spouse/significant other Available Help at Discharge: Family Type of Home: House Home Access: Stairs to enter   Technical brewer of Steps: 1 Home Layout: One level Home Equipment: None      Prior Function Level of Independence: Independent               Hand Dominance        Extremity/Trunk Assessment   Upper Extremity Assessment Upper Extremity Assessment: Overall WFL for tasks assessed    Lower Extremity Assessment Lower Extremity Assessment: Overall WFL for tasks assessed       Communication   Communication: No difficulties  Cognition Arousal/Alertness: Awake/alert Behavior During Therapy: WFL for tasks assessed/performed Overall Cognitive Status: Within Functional Limits for tasks assessed                                        General Comments      Exercises     Assessment/Plan    PT Assessment Patient needs continued PT services  PT Problem List Decreased activity tolerance;Decreased balance;Decreased mobility;Obesity;Pain       PT Treatment Interventions DME instruction;Gait training;Stair training;Functional mobility training;Therapeutic activities;Therapeutic exercise;Patient/family education;Balance training    PT Goals (Current goals  can be found in the Care Plan section)  Acute Rehab PT Goals Patient Stated Goal: Regain IND PT Goal Formulation: With patient Time For Goal Achievement: 10/29/20 Potential to Achieve Goals: Good    Frequency Min 3X/week   Barriers to discharge        Co-evaluation               AM-PAC PT "6 Clicks" Mobility  Outcome Measure Help needed turning from your  back to your side while in a flat bed without using bedrails?: A Little Help needed moving from lying on your back to sitting on the side of a flat bed without using bedrails?: A Little Help needed moving to and from a bed to a chair (including a wheelchair)?: A Little Help needed standing up from a chair using your arms (e.g., wheelchair or bedside chair)?: A Little Help needed to walk in hospital room?: A Little Help needed climbing 3-5 steps with a railing? : A Little 6 Click Score: 18    End of Session   Activity Tolerance: Patient tolerated treatment well Patient left: in chair;with call bell/phone within reach Nurse Communication: Mobility status PT Visit Diagnosis: Unsteadiness on feet (R26.81);Difficulty in walking, not elsewhere classified (R26.2)    Time: 8185-9093 PT Time Calculation (min) (ACUTE ONLY): 18 min   Charges:   PT Evaluation $PT Eval Low Complexity: 1 Low          [place] Pager [phone] Office [phone]   Anahla Bevis 10/16/2020, 11:30 AM

## 2020-10-16 NOTE — Progress Notes (Signed)
PROGRESS NOTE  Lauren Mcpherson TKW:[id number] DOB: Oct 19, 1965   PCP: Pcp, No  Patient is from: Home.  DOA: 10/10/2020 LOS: 6  Chief complaints: Nausea and vomiting  Brief Narrative / Interim history: 55 year old F with PMH of morbid obesity and recent cholecystectomy presenting with nausea, vomiting and abdominal pain that have started the morning of presentation, and admitted for possible small bowel obstruction.  Blood work with mild transaminitis.  CT abdomen and pelvis showed several ill-defined hypoattenuated masses throughout the liver and SBO to the level of terminal ileum with thickened terminal ileum.  She did not require NG tube.  MRI ordered.  Patient was admitted and general surgery consulted.  Subjective: PoD#1, walked this am, passing gas, pain is controlled   not in distress, husband at bedside   Objective: Vitals:   10/16/20 0235 10/16/20 0500 10/16/20 0531 10/16/20 0956  BP: (!) 145/98  132/82 110/72  Pulse: 76  74 86  Resp: 17  18 18   Temp: 98.2 F (36.8 C)  98.1 F (36.7 C) 97.8 F (36.6 C)  TempSrc: Oral  Oral Oral  SpO2: 98%  98% 97%  Weight:  134.4 kg    Height:        Intake/Output Summary (Last 24 hours) at 10/16/2020 1227 Last data filed at 10/16/2020 1000 Gross per 24 hour  Intake 3101.13 ml  Output 1730 ml  Net 1371.13 ml    Filed Weights   10/13/20 1059 10/15/20 1204 10/16/20 0500  Weight: 127 kg 127 kg 134.4 kg    Examination:  GENERAL: No apparent distress.  Nontoxic. HEENT: MMM.  Vision and hearing grossly intact.  NECK: Supple.  No apparent JVD.  RESP: On RA.  No IWOB.  Fair aeration bilaterally. CVS:  RRR. Heart sounds normal.  ABD/GI/GU: BS+. Post op changes, decreased bowel sounds  MSK/EXT:  Moves extremities. No apparent deformity. No edema.  SKIN: no apparent skin lesion or wound NEURO: Awake, alert and oriented appropriately.  No apparent focal neuro deficit. PSYCH: Calm. Normal affect.   Procedures:  None  Microbiology  summarized: [id number] and influenza PCR nonreactive.  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  She also had thickened distal ileum.  MRI suspicious for mets to liver No constitutional symptoms.  No personal or family history of cancer or GI disease.  Birads 3 mammo earlier this year advised f/u in 3 mo.  -cea 37.7 -Colonoscopy showed adenocarcinoma - liver biopsy by IR may not be possible due to bodies habitus - LAPAROSCOPIC LIVER BIOPSY on 6/24    Hyponatremia:  Resolved w/ fluids  Hypokalemia Mild, 3.3, mg wnl -replaced and normalized   Hypomagnesemia Replace mag  Morbid obesity Body mass index is 52.49 kg/m.  -Encourage lifestyle change to lose weight.       DVT prophylaxis:  SCD's Start: 10/15/20 1800 Place TED hose Start: 10/15/20 1800  On subcu Lovenox  Code Status: Full code Family Communication: Husband at bedside Level of care: Med-Surg Status is: Inpatient   Dispo: The patient is from: Home              Anticipated  d/c is to: Home              Patient currently is not medically stable to d/c.,  Need general surgery clearance   Difficult to place patient No    Consultants:  General surgery GI IR   Sch Meds:  Scheduled Meds:  acetaminophen  1,000 mg Oral Q6H   Chlorhexidine Gluconate Cloth  6 each Topical Daily   enoxaparin (LOVENOX) injection  60 mg Subcutaneous Q24H   feeding supplement  1 Container Oral TID BM   feeding supplement  237 mL Oral BID BM   gabapentin  300 mg Oral QHS   lip balm  1 application Topical  BID   polycarbophil  625 mg Oral BID   potassium chloride  40 mEq Oral Daily   saccharomyces boulardii  250 mg Oral BID   sodium chloride flush  3 mL Intravenous Q12H   Continuous Infusions:  [START ON 10/17/2020] sodium chloride     lactated ringers     methocarbamol (ROBAXIN) IV     PRN Meds:.[START ON 10/17/2020] sodium chloride, diphenhydrAMINE **OR** diphenhydrAMINE, enalaprilat, hydrALAZINE, lactated ringers, magic mouthwash, methocarbamol (ROBAXIN) IV, methocarbamol, morphine injection, ondansetron **OR** ondansetron (ZOFRAN) IV, oxyCODONE, simethicone, sodium chloride flush, traMADol  Antimicrobials: Anti-infectives (From admission, onward)    Start     Dose/Rate Route Frequency Ordered Stop   10/15/20 1400  cefoTEtan (CEFOTAN) 2 g in sodium chloride 0.9 % 100 mL IVPB        2 g 200 mL/hr over 30 Minutes Intravenous On call to O.R. 10/14/20 1626 10/15/20 1308   10/14/20 1700  neomycin (MYCIFRADIN) tablet 1,000 mg       See Hyperspace for full Linked Orders Report.   1,000 mg Oral 3 times per day 10/14/20 1626 10/15/20 0017   10/14/20 1700  metroNIDAZOLE (FLAGYL) tablet 1,000 mg       See Hyperspace for full Linked Orders Report.   1,000 mg Oral 3 times per day 10/14/20 1626 10/15/20 0016   10/11/20 0100  cefTRIAXone (ROCEPHIN) 1 g in sodium chloride 0.9 % 100 mL IVPB  Status:  Discontinued        1 g 200 mL/hr over 30 Minutes Intravenous Every 24 hours 10/11/20 0003 10/11/20 1138        I have personally reviewed the following labs and images: CBC: Recent Labs  Lab 10/10/20 1511 10/11/20 0350 10/15/20 0409 10/16/20 0514  WBC 5.4 4.5 5.7 11.4*  NEUTROABS 2.5  --   --   --   HGB 14.2 13.0 13.2 13.0  HCT 44.2 40.4 42.3 39.9  MCV 95.1 94.6 96.6 94.1  PLT 342 283 287 298   BMP &GFR Recent Labs  Lab 10/12/20 0344 10/13/20 0312 10/14/20 0507 10/15/20 0409 10/16/20 0514  NA 139 140 138 139 133*  K 3.3* 4.0 3.5 4.0 3.5  CL 106 107 105 105 100  CO2 27 27 25 24  27   GLUCOSE 141* 115* 91 109* 122*  BUN 7 <5* <5* <5* <5*  CREATININE 0.76 0.84 0.73 0.81 0.71  CALCIUM 8.7* 9.0 8.4* 8.9 8.2*  MG 1.9  --   --  1.7 1.6*  PHOS 2.9  --   --   --   --    Estimated Creatinine Clearance: 108.1 mL/min (by C-G formula based on SCr of 0.71 mg/dL). Liver & Pancreas: Recent Labs  Lab 10/10/20 1511 10/11/20 0350 10/12/20 0344  AST 61* 44* 39  ALT 66* 58* 51*  ALKPHOS  72 63 64  BILITOT 0.8 0.9 0.5  PROT 7.4 6.4* 6.4*  ALBUMIN 3.5 3.1* 3.0*   Recent Labs  Lab 10/10/20 1511  LIPASE 23   No results for input(s): AMMONIA in the last 168 hours. Diabetic: No results for input(s): HGBA1C in the last 72 hours. No results for input(s): GLUCAP in the last 168 hours. Cardiac Enzymes: No results for input(s): CKTOTAL, CKMB, CKMBINDEX, TROPONINI in the last 168 hours. No results for input(s): PROBNP in the last 8760 hours. Coagulation Profile: Recent Labs  Lab 10/13/20 0312  INR 1.0   Thyroid Function Tests: No results for input(s): TSH, T4TOTAL, FREET4, T3FREE, THYROIDAB in the last 72 hours. Lipid Profile: No results for input(s): CHOL, HDL, LDLCALC, TRIG, CHOLHDL, LDLDIRECT in the last 72 hours. Anemia Panel: No results for input(s): VITAMINB12, FOLATE, FERRITIN, TIBC, IRON, RETICCTPCT in the last 72 hours. Urine analysis:    Component Value Date/Time   COLORURINE YELLOW 10/10/2020 1916   APPEARANCEUR CLEAR 10/10/2020 1916   LABSPEC <1.005 (L) 10/10/2020 1916   PHURINE 5.0 10/10/2020 1916   GLUCOSEU NEGATIVE 10/10/2020 1916   HGBUR SMALL (A) 10/10/2020 1916   BILIRUBINUR NEGATIVE 10/10/2020 1916   KETONESUR 40 (A) 10/10/2020 1916   PROTEINUR NEGATIVE 10/10/2020 1916   UROBILINOGEN 1.0 08/20/2012 1345   NITRITE POSITIVE (A) 10/10/2020 1916   LEUKOCYTESUR NEGATIVE 10/10/2020 1916   Sepsis Labs: Invalid input(s): PROCALCITONIN, [place]  Microbiology: Recent Results (from the past 240 hour(s))  Resp Panel by RT-PCR (Flu A&B, Covid)  Nasopharyngeal Swab     Status: None   Collection Time: 10/10/20  5:18 PM   Specimen: Nasopharyngeal Swab; Nasopharyngeal(NP) swabs in vial transport medium  Result Value Ref Range Status   SARS Coronavirus 2 by RT PCR NEGATIVE NEGATIVE Final    Comment: (NOTE) SARS-CoV-2 target nucleic acids are NOT DETECTED.  The SARS-CoV-2 RNA is generally detectable in upper respiratory specimens during the acute phase of infection. The lowest concentration of SARS-CoV-2 viral copies this assay can detect is 138 copies/mL. A negative result does not preclude SARS-Cov-2 infection and should not be used as the sole basis for treatment or other patient management decisions. A negative result may occur with  improper specimen collection/handling, submission of specimen other than nasopharyngeal swab, presence of viral mutation(s) within the areas targeted by this assay, and inadequate number of viral copies(<138 copies/mL). A negative result must be combined with clinical observations, patient history, and epidemiological information. The expected result is Negative.  Fact Sheet for Patients:  EntrepreneurPulse.com.au  Fact Sheet for Healthcare Providers:  IncredibleEmployment.be  This test is no t yet approved or cleared by the [place] FDA and  has been authorized for detection and/or diagnosis of SARS-CoV-2 by FDA under an Emergency Use Authorization (EUA). This EUA will remain  in effect (meaning this test can be used) for the duration of the COVID-19 declaration under Section 564(b)(1) of the Act, 21 U.S.C.section 360bbb-3(b)(1), unless the authorization is terminated  or revoked sooner.       Influenza A by PCR NEGATIVE NEGATIVE Final   Influenza B by PCR NEGATIVE NEGATIVE Final    Comment: (NOTE) The Xpert Xpress SARS-CoV-2/FLU/RSV plus assay is intended as an aid in the diagnosis of influenza from Nasopharyngeal swab specimens and should not be  used as a sole basis for treatment. Nasal washings and aspirates are unacceptable for Xpert Xpress SARS-CoV-2/FLU/RSV testing.  Fact Sheet for Patients: EntrepreneurPulse.com.au  Fact Sheet for Healthcare Providers: IncredibleEmployment.be  This test is  not yet approved or cleared by the [place] and has been authorized for detection and/or diagnosis of SARS-CoV-2 by FDA under an Emergency Use Authorization (EUA). This EUA will remain in effect (meaning this test can be used) for the duration of the COVID-19 declaration under Section 564(b)(1) of the Act, 21 U.S.C. section 360bbb-3(b)(1), unless the authorization is terminated or revoked.  Performed at [facility], [address], [place], [place] [address]   Surgical PCR screen     Status: None   Collection Time: 10/14/20  6:37 PM   Specimen: Nasal Mucosa; Nasal Swab  Result Value Ref Range Status   MRSA, PCR NEGATIVE NEGATIVE Final   Staphylococcus aureus NEGATIVE NEGATIVE Final    Comment: (NOTE) The Xpert SA Assay (FDA approved for NASAL specimens in patients 41 years of age and older), is one component of a comprehensive surveillance program. It is not intended to diagnose infection nor to guide or monitor treatment. Performed at [facility], [place] [address] [place], [place] [address]     Radiology Studies: No results found.     Florencia Reasons, MD PhD FACP [facility]  If 7PM-7AM, please contact night-coverage [email or web address] 10/16/2020, 12:27 PM

## 2020-10-17 ENCOUNTER — Encounter (HOSPITAL_COMMUNITY): Payer: Self-pay | Admitting: General Surgery

## 2020-10-17 DIAGNOSIS — C180 Malignant neoplasm of cecum: Principal | ICD-10-CM

## 2020-10-17 LAB — CREATININE, SERUM
Creatinine, Ser: 0.79 mg/dL (ref 0.44–1.00)
GFR, Estimated: >60 mL/min (ref >60)

## 2020-10-17 LAB — CBC
HCT: 36.3 % (ref 36.0–46.0)
Hemoglobin: 11.5 g/dL — ABNORMAL LOW (ref 12.0–15.0)
MCH: 30.4 pg (ref 26.0–34.0)
MCHC: 31.7 g/dL (ref 30.0–36.0)
MCV: 96 fL (ref 80.0–100.0)
Platelets: 263 10*3/uL (ref 150–400)
RBC: 3.78 MIL/uL — ABNORMAL LOW (ref 3.87–5.11)
RDW: 15.5 % (ref 11.5–15.5)
WBC: 6.7 10*3/uL (ref 4.0–10.5)
nRBC: 0 % (ref 0.0–0.2)

## 2020-10-17 LAB — MAGNESIUM: Magnesium: 2.3 mg/dL (ref 1.7–2.4)

## 2020-10-17 LAB — POTASSIUM: Potassium: 3.3 mmol/L — ABNORMAL LOW (ref 3.5–5.1)

## 2020-10-17 NOTE — Progress Notes (Signed)
2 Days Post-Op   Subjective/Chief Complaint: Flatus, no BM yet Tolerating diet without nausea Ambulating Pain controlled with tylenol   Objective: Vital signs in last 24 hours: Temp:  [97.6 F (36.4 C)-98.8 F (37.1 C)] 98.4 F (36.9 C) (06/26 0446) Pulse Rate:  [90-98] 94 (06/26 0446) Resp:  [18] 18 (06/26 0446) BP: (98-126)/(50-87) 110/73 (06/26 0446) SpO2:  [95 %-97 %] 97 % (06/26 0446) Weight:  [132.1 kg] 132.1 kg (06/26 0500) Last BM Date: 10/15/20  Intake/Output from previous day: 06/25 0701 - 06/26 0700 In: 2425 [P.O.:1800; I.V.:525; IV Piggyback:100] Out: 800 [Urine:800] Intake/Output this shift: Total I/O In: 240 [P.O.:240] Out: 300 [Urine:300]  General: Pt awake/alert in no acute distress.  Sitting up in chair calm and relaxed. Eyes: PERRL, normal EOM.  Sclera clear.  No icterus Neuro: CN II-XII intact w/o focal sensory/motor deficits. Lymph: No head/neck/groin lymphadenopathy Psych:  No delerium/psychosis/paranoia.  Oriented x 4 HENT: Normocephalic, Mucus membranes moist.  No thrush Neck: Supple, No tracheal deviation.  No obvious thyromegaly Chest: No pain to chest wall compression.  Good respiratory excursion.  No audible wheezing CV:  Pulses intact.  Regular rhythm.  No major extremity edema MS: Normal AROM mjr joints.  No obvious deformity   Abdomen: Soft.  Mildy distended.  Mildly tender at incisions only.  No evidence of peritonitis.  No incarcerated hernias.   Ext:  No deformity.  No mjr edema.  No cyanosis Skin: No petechiae / purpurea.  No major sores.  Warm and dry   Lab Results:  Recent Labs    10/16/20 0514 10/17/20 0424  WBC 11.4* 6.7  HGB 13.0 11.5*  HCT 39.9 36.3  PLT 298 263   BMET Recent Labs    10/15/20 0409 10/16/20 0514 10/17/20 0424  NA 139 133*  --   K 4.0 3.5 3.3*  CL 105 100  --   CO2 24 27  --   GLUCOSE 109* 122*  --   BUN <5* <5*  --   CREATININE 0.81 0.71 0.79  CALCIUM 8.9 8.2*  --    PT/INR No results for  input(s): LABPROT, INR in the last 72 hours. ABG No results for input(s): PHART, HCO3 in the last 72 hours.  Invalid input(s): PCO2, PO2  Studies/Results: No results found.  Anti-infectives: Anti-infectives (From admission, onward)    Start     Dose/Rate Route Frequency Ordered Stop   10/15/20 1400  cefoTEtan (CEFOTAN) 2 g in sodium chloride 0.9 % 100 mL IVPB        2 g 200 mL/hr over 30 Minutes Intravenous On call to O.R. 10/14/20 1626 10/15/20 1308   10/14/20 1700  neomycin (MYCIFRADIN) tablet 1,000 mg       See Hyperspace for full Linked Orders Report.   1,000 mg Oral 3 times per day 10/14/20 1626 10/15/20 0017   10/14/20 1700  metroNIDAZOLE (FLAGYL) tablet 1,000 mg       See Hyperspace for full Linked Orders Report.   1,000 mg Oral 3 times per day 10/14/20 1626 10/15/20 0016   10/11/20 0100  cefTRIAXone (ROCEPHIN) 1 g in sodium chloride 0.9 % 100 mL IVPB  Status:  Discontinued        1 g 200 mL/hr over 30 Minutes Intravenous Every 24 hours 10/11/20 0003 10/11/20 1138       Assessment/Plan: Cecal adenocarcinoma/ right liver mass S/p laparoscopic-assisted right hemicolectomy/ liver biopsy 10/15/20 - Dr. Redmond Pulling Beginning to have some bowel function Pain well-controlled Path pending  Advance to soft diet Possible discharge tomorrow F/U Dr. Redmond Pulling   LOS: 7 days    Lauren Mcpherson 10/17/2020

## 2020-10-17 NOTE — Progress Notes (Signed)
PROGRESS NOTE  GRIFFIN DEWILDE YHC:[id number] DOB: [birth year]   PCP: Pcp, No  Patient is from: Home.  DOA: 10/10/2020 LOS: 7  Chief complaints: Nausea and vomiting  Brief Narrative / Interim history: 55 year old F with PMH of morbid obesity and recent cholecystectomy presenting with nausea, vomiting and abdominal pain that have started the morning of presentation, and admitted for possible small bowel obstruction.  Blood work with mild transaminitis.  CT abdomen and pelvis showed several ill-defined hypoattenuated masses throughout the liver and SBO to the level of terminal ileum with thickened terminal ileum.  She did not require NG tube.  MRI ordered.  Patient was admitted and general surgery consulted.  Subjective: PoD#2 walked in the hallway this am, passing gas, no bm, tolerating diet, no n/v, pain is controlled   not in distress, husband at bedside   Objective: Vitals:   10/16/20 2153 10/17/20 0446 10/17/20 0500 10/17/20 1334  BP: 126/87 110/73  128/72  Pulse: 98 94  93  Resp: 18 18    Temp: 98.6 F (37 C) 98.4 F (36.9 C)  98.1 F (36.7 C)  TempSrc: Oral Oral  Oral  SpO2: 96% 97%  97%  Weight:   132.1 kg   Height:        Intake/Output Summary (Last 24 hours) at 10/17/2020 1456 Last data filed at 10/17/2020 1400 Gross per 24 hour  Intake 1800 ml  Output 950 ml  Net 850 ml    Filed Weights   10/15/20 1204 10/16/20 0500 10/17/20 0500  Weight: 127 kg 134.4 kg 132.1 kg    Examination:  GENERAL: No apparent distress.  Nontoxic. HEENT: MMM.  Vision and hearing grossly intact.  NECK: Supple.  No apparent JVD.  RESP: On RA.  No IWOB.  Fair aeration bilaterally. CVS:  RRR. Heart sounds normal.  ABD/GI/GU: BS+. Post op changes, decreased bowel sounds  MSK/EXT:  Moves extremities. No apparent deformity. No edema.  SKIN: no apparent skin lesion or wound NEURO: Awake, alert and oriented appropriately.  No apparent focal neuro deficit. PSYCH: Calm. Normal affect.    Procedures:  None  Microbiology summarized: DVVOH-60 and influenza PCR nonreactive.  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  She also had thickened distal ileum.  MRI suspicious for mets to liver No constitutional symptoms.  No personal or family history of cancer or GI disease.  Birads 3 mammo earlier this year advised f/u in 3 mo.  -cea 37.7 -Colonoscopy showed adenocarcinoma - liver biopsy by IR may not be possible due to bodies habitus - LAPAROSCOPIC LIVER BIOPSY on 6/24, pathology reports pending     Hyponatremia:  Resolved w/ fluids  Hypokalemia Mild, 3.3, mg wnl -remain low, continue to replace  Hypomagnesemia Replaced mag, normalized   Morbid obesity Body mass index is 51.6 kg/m.  -Encourage lifestyle change to lose weight.       DVT prophylaxis:  SCD's Start: 10/15/20 1800 Place TED hose Start: 10/15/20 1800  On subcu Lovenox  Code Status: Full code Family Communication: Husband at bedside Level of care: Med-Surg Status is: Inpatient  Dispo: The patient is from: Home  Anticipated d/c is to: Home              possible discharge on Monday if cleared by general surgery    Difficult to place patient No     Consultants:  General surgery GI IR   Sch Meds:  Scheduled Meds:  acetaminophen  1,000 mg Oral Q6H   enoxaparin (LOVENOX) injection  60 mg Subcutaneous Q24H   feeding supplement  1 Container Oral TID BM   feeding supplement  237 mL Oral BID BM   gabapentin  300 mg Oral QHS   lip balm  1 application  Topical BID   polycarbophil  625 mg Oral BID   potassium chloride  40 mEq Oral Daily   saccharomyces boulardii  250 mg Oral BID   sodium chloride flush  3 mL Intravenous Q12H   Continuous Infusions:  sodium chloride     lactated ringers     methocarbamol (ROBAXIN) IV     PRN Meds:.sodium chloride, diphenhydrAMINE **OR** diphenhydrAMINE, enalaprilat, hydrALAZINE, lactated ringers, magic mouthwash, methocarbamol (ROBAXIN) IV, methocarbamol, morphine injection, ondansetron **OR** ondansetron (ZOFRAN) IV, oxyCODONE, simethicone, sodium chloride flush, traMADol  Antimicrobials: Anti-infectives (From admission, onward)    Start     Dose/Rate Route Frequency Ordered Stop   10/15/20 1400  cefoTEtan (CEFOTAN) 2 g in sodium chloride 0.9 % 100 mL IVPB        2 g 200 mL/hr over 30 Minutes Intravenous On call to O.R. 10/14/20 1626 10/15/20 1308   10/14/20 1700  neomycin (MYCIFRADIN) tablet 1,000 mg       See Hyperspace for full Linked Orders Report.   1,000 mg Oral 3 times per day 10/14/20 1626 10/15/20 0017   10/14/20 1700  metroNIDAZOLE (FLAGYL) tablet 1,000 mg       See Hyperspace for full Linked Orders Report.   1,000 mg Oral 3 times per day 10/14/20 1626 10/15/20 0016   10/11/20 0100  cefTRIAXone (ROCEPHIN) 1 g in sodium chloride 0.9 % 100 mL IVPB  Status:  Discontinued        1 g 200 mL/hr over 30 Minutes Intravenous Every 24 hours 10/11/20 0003 10/11/20 1138        I have personally reviewed the following labs and images: CBC: Recent Labs  Lab 10/10/20 1511 10/11/20 0350 10/15/20 0409 10/16/20 0514 10/17/20 0424  WBC 5.4 4.5 5.7 11.4* 6.7  NEUTROABS 2.5  --   --   --   --   HGB 14.2 13.0 13.2 13.0 11.5*  HCT 44.2 40.4 42.3 39.9 36.3  MCV 95.1 94.6 96.6 94.1 96.0  PLT 342 283 287 298 263   BMP &GFR Recent Labs  Lab 10/12/20 0344 10/13/20 0312 10/14/20 0507 10/15/20 0409 10/16/20 0514 10/17/20 0424  NA 139 140 138 139 133*  --   K 3.3* 4.0 3.5 4.0 3.5 3.3*  CL 106  107 105 105 100  --   CO2 27 27 25 24 27   --   GLUCOSE 141* 115* 91 109* 122*  --   BUN 7 <5* <5* <5* <5*  --   CREATININE 0.76 0.84 0.73 0.81 0.71 0.79  CALCIUM 8.7* 9.0 8.4* 8.9 8.2*  --   MG 1.9  --   --  1.7 1.6* 2.3  PHOS 2.9  --   --   --   --   --    Estimated Creatinine Clearance: 107 mL/min (by C-G formula based on SCr of 0.79 mg/dL). Liver & Pancreas: Recent Labs  Lab 10/10/20 1511 10/11/20 0350 10/12/20 0344  AST 61* 44* 39  ALT 66* 58* 51*  ALKPHOS 72 63 64  BILITOT 0.8 0.9 0.5  PROT 7.4 6.4* 6.4*  ALBUMIN 3.5 3.1* 3.0*   Recent Labs  Lab 10/10/20 1511  LIPASE 23   No results for input(s): AMMONIA in the last 168 hours. Diabetic: No results for input(s): HGBA1C in the last 72 hours. No results for input(s): GLUCAP in the last 168 hours. Cardiac Enzymes: No results for input(s): CKTOTAL, CKMB, CKMBINDEX, TROPONINI in the last 168 hours. No results for input(s): PROBNP in the last 8760 hours. Coagulation Profile: Recent Labs  Lab 10/13/20 0312  INR 1.0   Thyroid Function Tests: No results for input(s): TSH, T4TOTAL, FREET4, T3FREE, THYROIDAB in the last 72 hours. Lipid Profile: No results for input(s): CHOL, HDL, LDLCALC, TRIG, CHOLHDL, LDLDIRECT in the last 72 hours. Anemia Panel: No results for input(s): VITAMINB12, FOLATE, FERRITIN, TIBC, IRON, RETICCTPCT in the last 72 hours. Urine analysis:    Component Value Date/Time   COLORURINE YELLOW 10/10/2020 1916   APPEARANCEUR CLEAR 10/10/2020 1916   LABSPEC <1.005 (L) 10/10/2020 1916   PHURINE 5.0 10/10/2020 1916   GLUCOSEU NEGATIVE 10/10/2020 1916   HGBUR SMALL (A) 10/10/2020 1916   BILIRUBINUR NEGATIVE 10/10/2020 1916   KETONESUR 40 (A) 10/10/2020 1916   PROTEINUR NEGATIVE 10/10/2020 1916   UROBILINOGEN 1.0 08/20/2012 1345   NITRITE POSITIVE (A) 10/10/2020 1916   LEUKOCYTESUR NEGATIVE 10/10/2020 1916   Sepsis Labs: Invalid input(s): PROCALCITONIN, [place]  Microbiology: Recent Results  (from the past 240 hour(s))  Resp Panel by RT-PCR (Flu A&B, Covid) Nasopharyngeal Swab     Status: None   Collection Time: 10/10/20  5:18 PM   Specimen: Nasopharyngeal Swab; Nasopharyngeal(NP) swabs in vial transport medium  Result Value Ref Range Status   SARS Coronavirus 2 by RT PCR NEGATIVE NEGATIVE Final    Comment: (NOTE) SARS-CoV-2 target nucleic acids are NOT DETECTED.  The SARS-CoV-2 RNA is generally detectable in upper respiratory specimens during the acute phase of infection. The lowest concentration of SARS-CoV-2 viral copies this assay can detect is 138 copies/mL. A negative result does not preclude SARS-Cov-2 infection and should not be used as the sole basis for treatment or other patient management decisions. A negative result may occur with  improper specimen collection/handling, submission of specimen other than nasopharyngeal swab, presence of viral mutation(s) within the areas targeted by this assay, and inadequate number of viral copies(<138 copies/mL). A negative result must be combined with clinical observations, patient history, and epidemiological information. The expected result is Negative.  Fact Sheet for Patients:  EntrepreneurPulse.com.au  Fact Sheet for Healthcare Providers:  IncredibleEmployment.be  This test is no t yet approved or cleared by the [place] FDA and  has been authorized for detection and/or diagnosis of SARS-CoV-2 by FDA under an Emergency Use Authorization (EUA). This EUA will remain  in effect (meaning this test can be used) for the duration of the COVID-19 declaration under Section 564(b)(1) of the Act, 21 U.S.C.section 360bbb-3(b)(1), unless the authorization is terminated  or revoked sooner.       Influenza A by PCR NEGATIVE NEGATIVE Final   Influenza B by PCR NEGATIVE NEGATIVE Final    Comment: (NOTE) The Xpert Xpress SARS-CoV-2/FLU/RSV plus assay is intended as an aid in the  diagnosis of influenza from Nasopharyngeal swab specimens and should not be used as a sole basis for treatment. Nasal washings and aspirates are unacceptable for Xpert Xpress  SARS-CoV-2/FLU/RSV testing.  Fact Sheet for Patients: EntrepreneurPulse.com.au  Fact Sheet for Healthcare Providers: IncredibleEmployment.be  This test is not yet approved or cleared by the [place] FDA and has been authorized for detection and/or diagnosis of SARS-CoV-2 by FDA under an Emergency Use Authorization (EUA). This EUA will remain in effect (meaning this test can be used) for the duration of the COVID-19 declaration under Section 564(b)(1) of the Act, 21 U.S.C. section 360bbb-3(b)(1), unless the authorization is terminated or revoked.  Performed at [facility], [address], [place], [place] [address]   Surgical PCR screen     Status: None   Collection Time: 10/14/20  6:37 PM   Specimen: Nasal Mucosa; Nasal Swab  Result Value Ref Range Status   MRSA, PCR NEGATIVE NEGATIVE Final   Staphylococcus aureus NEGATIVE NEGATIVE Final    Comment: (NOTE) The Xpert SA Assay (FDA approved for NASAL specimens in patients 85 years of age and older), is one component of a comprehensive surveillance program. It is not intended to diagnose infection nor to guide or monitor treatment. Performed at [facility], [place] [address] [place], [place] [address]     Radiology Studies: No results found.     Florencia Reasons, MD PhD FACP [facility]  If 7PM-7AM, please contact night-coverage [email or web address] 10/17/2020, 2:56 PM

## 2020-10-18 LAB — CBC
HCT: 35.9 % — ABNORMAL LOW (ref 36.0–46.0)
Hemoglobin: 11.2 g/dL — ABNORMAL LOW (ref 12.0–15.0)
MCH: 29.8 pg (ref 26.0–34.0)
MCHC: 31.2 g/dL (ref 30.0–36.0)
MCV: 95.5 fL (ref 80.0–100.0)
Platelets: 268 10*3/uL (ref 150–400)
RBC: 3.76 MIL/uL — ABNORMAL LOW (ref 3.87–5.11)
RDW: 15.6 % — ABNORMAL HIGH (ref 11.5–15.5)
WBC: 5.4 10*3/uL (ref 4.0–10.5)
nRBC: 0 % (ref 0.0–0.2)

## 2020-10-18 LAB — BASIC METABOLIC PANEL
Anion gap: 8 (ref 5–15)
BUN: 6 mg/dL (ref 6–20)
CO2: 25 mmol/L (ref 22–32)
Calcium: 8.5 mg/dL — ABNORMAL LOW (ref 8.9–10.3)
Chloride: 103 mmol/L (ref 98–111)
Creatinine, Ser: 0.85 mg/dL (ref 0.44–1.00)
GFR, Estimated: >60 mL/min (ref >60)
Glucose, Bld: 103 mg/dL — ABNORMAL HIGH (ref 70–99)
Potassium: 3.9 mmol/L (ref 3.5–5.1)
Sodium: 136 mmol/L (ref 135–145)

## 2020-10-18 LAB — MAGNESIUM: Magnesium: 2.1 mg/dL (ref 1.7–2.4)

## 2020-10-18 MED ORDER — OXYCODONE HCL 5 MG PO TABS: 5.0000 mg | ORAL_TABLET | Freq: Four times a day (QID) | ORAL | 0 refills | Status: DC | PRN

## 2020-10-18 NOTE — Progress Notes (Signed)
3 Days Post-Op  Subjective: CC: Doing well.  Tolerating soft diet without any nausea or vomiting.  Some soreness around her incisions that is well controlled.  Passing flatus.  BM yesterday.  Voiding.  Mobilizing halls.  Objective: Vital signs in last 24 hours: Temp:  [98.1 F (36.7 C)-98.7 F (37.1 C)] 98.7 F (37.1 C) (06/27 0536) Pulse Rate:  [85-93] 90 (06/27 0536) Resp:  [17-18] 18 (06/27 0536) BP: (111-128)/(70-83) 114/83 (06/27 0536) SpO2:  [94 %-99 %] 94 % (06/27 0536) Last BM Date: 10/17/20  Intake/Output from previous day: 06/26 0701 - 06/27 0700 In: 1080 [P.O.:1080] Out: 1050 [Urine:1050] Intake/Output this shift: Total I/O In: 240 [P.O.:240] Out: -   PE: Gen:  Alert, NAD, pleasant Card:  RRR, no M/G/R heard Pulm:  CTAB, no W/R/R, effort normal Abd: Soft, ND, appropriately tender around incisions without peritonitis.  Otherwise nontender.  Midline and laparoscopic incisions with staples in place, c/d/I.  Ext:  No LE edema  Psych: A&Ox3  Skin: no rashes noted, warm and dry   Lab Results:  Recent Labs    10/17/20 0424 10/18/20 0420  WBC 6.7 5.4  HGB 11.5* 11.2*  HCT 36.3 35.9*  PLT 263 268   BMET Recent Labs    10/16/20 0514 10/17/20 0424 10/18/20 0420  NA 133*  --  136  K 3.5 3.3* 3.9  CL 100  --  103  CO2 27  --  25  GLUCOSE 122*  --  103*  BUN <5*  --  6  CREATININE 0.71 0.79 0.85  CALCIUM 8.2*  --  8.5*   PT/INR No results for input(s): LABPROT, INR in the last 72 hours. CMP     Component Value Date/Time   NA 136 10/18/2020 0420   K 3.9 10/18/2020 0420   CL 103 10/18/2020 0420   CO2 25 10/18/2020 0420   GLUCOSE 103 (H) 10/18/2020 0420   BUN 6 10/18/2020 0420   CREATININE 0.85 10/18/2020 0420   CALCIUM 8.5 (L) 10/18/2020 0420   PROT 6.4 (L) 10/12/2020 0344   ALBUMIN 3.0 (L) 10/12/2020 0344   AST 39 10/12/2020 0344   ALT 51 (H) 10/12/2020 0344   ALKPHOS 64 10/12/2020 0344   BILITOT 0.5 10/12/2020 0344   GFRNONAA >60  10/18/2020 0420   Lipase     Component Value Date/Time   LIPASE 23 10/10/2020 1511       Studies/Results: No results found.  Anti-infectives: Anti-infectives (From admission, onward)    Start     Dose/Rate Route Frequency Ordered Stop   10/15/20 1400  cefoTEtan (CEFOTAN) 2 g in sodium chloride 0.9 % 100 mL IVPB        2 g 200 mL/hr over 30 Minutes Intravenous On call to O.R. 10/14/20 1626 10/15/20 1308   10/14/20 1700  neomycin (MYCIFRADIN) tablet 1,000 mg       See Hyperspace for full Linked Orders Report.   1,000 mg Oral 3 times per day 10/14/20 1626 10/15/20 0017   10/14/20 1700  metroNIDAZOLE (FLAGYL) tablet 1,000 mg       See Hyperspace for full Linked Orders Report.   1,000 mg Oral 3 times per day 10/14/20 1626 10/15/20 0016   10/11/20 0100  cefTRIAXone (ROCEPHIN) 1 g in sodium chloride 0.9 % 100 mL IVPB  Status:  Discontinued        1 g 200 mL/hr over 30 Minutes Intravenous Every 24 hours 10/11/20 0003 10/11/20 1138  Assessment/Plan POD 3, s/p laparoscopic-assisted right colectomy, laparoscopic liver biopsy, laparoscopic tap block for cecal adenocarcinoma and liver lesions - Dr. Redmond Pulling - 10/15/20 - Path from surgery pending. Will need oncology follow up. Surgery was not curative but just to try to relieve the partial blockage to prevent complications during her chemotherapy  - Significant findings pre-op: CEA 37.7. Colonoscopy 6/22 showed frond-like/villous medium-sized non-circumferential mass in the cecum. No bleeding present. Bx with adenocarcinoma. MRI Multiple liver lesions concerning for metastatic disease. CT chest w/ 4 mm R LLL pulmonary nodule suspected to be benign but will need close follow-up.  There is no mediastinal or hilar mass or lymphadenopathy. - On POD 3, the patient was voiding well, tolerating diet, ambulating well, pain well controlled, vital signs stable, incisions c/d/i and felt stable for discharge home. Will arrange follow up with our  office. She will also need oncology follow up.  Discussed postop instructions with the patient.  Will send pain medication to her pharmacy. Will let [facility] know.     ID - Cefotetan periop VTE - SCDs, lovenox FEN - Soft Foley - none Follow up - TBD   Obesity BMI 50.49 To note, patient had abnormal mammogram and ultrasound of the right breast in April/May. There is no palpable masses/lumps or lymph nodes on exam. This may need additional imaging. Results as below. Mammogram 08/04/20: Further evaluation is suggested for a possible mass in the right breast.  Diagnostic Mammogram/R breast US 08/26/20: Intramammary lymph node (0.7 x 1 x 1.1 cm) over the 10 o'clock position of the right breast 12 cm from the nipple accounting for the mammographic finding with borderline cortical thickening as this likely represents a benign reactive lymph node. Ultrasound the right axilla demonstrates a few similar appearing lymph nodes with cortical thickness between 3-4 mm   LOS: 8 days    Jillyn Ledger , [facility] Surgery 10/18/2020, 10:12 AM Please see Amion for pager number during day hours 7:00am-4:30pm

## 2020-10-18 NOTE — Progress Notes (Signed)
Reviewed written d/c instructions w pt and husband and all questions answered. They both verbalized understanding. D/C per w/c w all belongings in stable condition.

## 2020-10-18 NOTE — Discharge Summary (Signed)
Physician Discharge Summary  Lauren Mcpherson JJO:[id number] DOB: 06-Oct-1965 DOA: 10/10/2020  PCP: Merryl Hacker, No  Admit date: 10/10/2020 Discharge date: 10/18/2020  Admitted From: Home Disposition:  Home  Discharge Condition:Stable CODE STATUS:FULL Diet recommendation:  Regular   Brief/Interim Summary:  55 year old F with PMH of morbid obesity and recent cholecystectomy presenting with nausea, vomiting and abdominal pain that have started the morning of presentation, and admitted for possible small bowel obstruction.  Lab work showed  mild transaminitis.  CT abdomen and pelvis showed several ill-defined hypoattenuated masses throughout the liver and SBO to the level of terminal ileum with thickened terminal ileum.  She did not require NG tube.    Patient was admitted and GI/general surgery consulted.  MRI of the abdomen with suspicion for mets to liver.  She underwent colonoscopy with biopsy of the cecal mass on 6/22, pathology showed adenocarcinoma.  She underwent laparoscopic assisted right colectomy, laparoscopic liver biopsy by Dr. Redmond Pulling on 6/24.  Pathology confirmed adenocarcinoma. General surgery recommended her to be discharged to home today.  She will follow-up with general surgery as an outpatient.  We have requested patient to set up an appointment with a primary care physician.  We have also provided her information to oncology department, case discussed with Dr. Sabino Niemann. Learta Codding.  Oncology will give her a call for the follow-up appointment. She is medically stable for discharge today.  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 2)Follow up with general surgery as an outpatient.  Appointment has been scheduled 3)We have forwarded your information to the oncology department.  They will call you for the follow-up appointment.   Increase activity slowly   Complete by: As directed    No wound care   Complete by: As directed       Allergies as of 10/18/2020   No Known Allergies      Medication List     STOP taking these medications    diclofenac 75 MG EC tablet Commonly known as: VOLTAREN       TAKE these medications    oxyCODONE 5 MG immediate release tablet Commonly known as: Oxy IR/ROXICODONE Take 1 tablet (5 mg total) by mouth every 6 (six) hours as needed for breakthrough pain.        Follow-up Information     Greer Pickerel, MD Follow up on 11/12/2020.   Specialty: General Surgery Why: 345pm. Please arrive 30 minutes prior to your appointment for paperwork. Please bring a copy of your photo ID and insurance card. Contact information: [address] [place] [address] [phone]         [facility], [place] Follow up on 10/26/2020.   Specialty: General Surgery Why: 2pm for staple removal. Please arrive 30 minutes prior to your appointment for paperwork. Please bring a copy of your photo ID and insurance card. Contact information: [address] [place]  [place] [phone]               No Known Allergies  Consultations: General surgery,oncology   Procedures/Studies: CT CHEST W CONTRAST  Result Date: 10/14/2020 CLINICAL DATA:  Metastatic colon cancer.  Staging CT scan. EXAM: CT CHEST WITH CONTRAST TECHNIQUE: Multidetector CT imaging of the chest was performed during intravenous contrast administration. CONTRAST:  59mL OMNIPAQUE IOHEXOL 300 MG/ML  SOLN COMPARISON:  CT scan  10/10/2020 FINDINGS: Cardiovascular: The heart is normal in size. No pericardial effusion. The aorta is normal in caliber. No dissection. No atherosclerotic calcifications. The branch vessels are patent. No definite coronary artery calcifications. Mediastinum/Nodes: No mediastinal or hilar mass or lymphadenopathy. The esophagus is unremarkable. Lungs/Pleura: The lungs are clear of an acute process. No infiltrates, edema or effusions. 4 mm nodule noted in the right lower lobe on image number 72/5. This is an indeterminate finding but I do not see any other pulmonary nodules to suggest pulmonary metastatic disease. Recommend attention on future/follow-up scans. Upper Abdomen: Liver lesions are not well demonstrated on this study. The gallbladder is surgically absent. No common bile duct dilatation. Stable low-attenuation cystic appearing lesion in the left upper quadrant could be projecting off the cardial region of the stomach or possibly off the upper aspect of the adrenal gland. It measures 5 Hounsfield units and is likely a benign cyst. No upper abdominal adenopathy. Musculoskeletal: No breast masses, supraclavicular or axillary adenopathy. No significant bony findings. IMPRESSION: 1. Single 4 mm right lower lobe pulmonary nodule, likely benign but indeterminate. Attention on future scans is suggested. 2. No mediastinal or hilar mass or adenopathy. 3. Liver lesions are not well demonstrated on this study. 4. Stable low-attenuation cystic appearing lesion in the left upper quadrant, likely a benign cyst. Electronically Signed   By: Marijo Sanes M.D.   On: 10/14/2020 16:16   MR ABDOMEN W WO CONTRAST  Result Date: 10/11/2020 CLINICAL DATA:  55 year old female with history of abdominal pain. Indeterminate liver lesions noted on recent CT examination. Evaluate for potential metastatic disease. EXAM: MRI ABDOMEN WITHOUT AND WITH CONTRAST TECHNIQUE: Multiplanar multisequence MR imaging of the abdomen was performed  both before and after the administration of intravenous contrast. CONTRAST:  61mL GADAVIST GADOBUTROL 1 MMOL/ML IV SOLN COMPARISON:  No prior abdominal MRI. CT the abdomen and pelvis 10/10/2020. FINDINGS: Lower chest: Unremarkable. Hepatobiliary: There are multiple lesions scattered throughout the liver which are mildly T1 hypointense, slightly T2 hyperintense and demonstrate a hypovascular appearance on post gadolinium images concerning for metastatic lesions. Specific examples include a 2.1 x 1.4 cm lesion in the periphery of the liver between segments 4A and 8 (axial image 31 of series 22), a 1.8 x 1.6 cm lesion in the caudate lobe (axial image 45 of series 2), and a 1.5 x 1.3 cm lesion in segment 7 (axial image 44 of series 22). There is also a small 7 mm lesion between segments 7 and 8 (axial image 43 of series 22). No intra or extrahepatic biliary ductal dilatation. Status post cholecystectomy. Pancreas: No pancreatic mass. No pancreatic ductal dilatation. No pancreatic or peripancreatic fluid collections or inflammatory changes. Spleen:  Unremarkable. Adrenals/Urinary Tract: 1.3 x 0.7 cm T1 hypointense, T2 hyperintense, nonenhancing lesion in the upper pole of the right kidney, compatible with a simple cyst. No aggressive appearing renal lesions. No hydroureteronephrosis in the visualized portions of the abdomen. Bilateral adrenal glands are normal in appearance. Stomach/Bowel: Several dilated loops of small bowel measuring up to 4.1 cm in  diameter with multiple air-fluid levels. Terminal ileum is partially imaged on several pulse sequences, and appears thickened with increased mural enhancement. Vascular/Lymphatic: No aneurysm identified in the visualized abdominal vasculature. Several prominent but nonenlarged ileocolic lymph nodes are noted. No other definite enlarged lymph nodes are noted elsewhere in the visualized portions of the abdomen. Other: No significant volume of ascites noted in the visualized  portions of the peritoneal cavity. Musculoskeletal: No aggressive appearing osseous lesions are noted in the visualized portions of the skeleton. IMPRESSION: 1. Multiple liver lesions concerning for metastatic disease, as detailed above. 2. Partially imaged terminal ileal thickening with increased mural enhancement and numerous prominent but nonenlarged adjacent ileocolic lymph nodes. This could represent inflammatory bowel disease such as Crohn's ileitis, however, the possibility of a malignant lesion warrants consideration, particularly in light of the liver lesions. This appears associated with small bowel obstruction, similar to recent CT examination. Electronically Signed   By: Vinnie Langton M.D.   On: 10/11/2020 12:03   CT Abdomen Pelvis W Contrast  Result Date: 10/10/2020 CLINICAL DATA:  Suspected bowel obstruction. EXAM: CT ABDOMEN AND PELVIS WITH CONTRAST TECHNIQUE: Multidetector CT imaging of the abdomen and pelvis was performed using the standard protocol following bolus administration of intravenous contrast. CONTRAST:  175mL OMNIPAQUE IOHEXOL 300 MG/ML  SOLN COMPARISON:  None. FINDINGS: Lower chest: No acute abnormality. Hepatobiliary: Several ill-defined hypoattenuated masses throughout the liver. Status post cholecystectomy. No biliary dilatation. Pancreas: Unremarkable. No pancreatic ductal dilatation or surrounding inflammatory changes. Spleen: Normal in size without focal abnormality. Adrenals/Urinary Tract: Adrenal glands are unremarkable. Kidneys are without renal calculi, focal lesion, or hydronephrosis. Few mm probable right renal cyst. Bladder is unremarkable. Stomach/Bowel: 1.5 cm gastric diverticulum off of the posterior gastric body. There is diffuse distal small bowel dilation to the level of the terminal ileum. The appendix is normal. The colon is decompressed. Nonspecific thickening of the terminal ileum. Scattered colonic diverticulosis. Vascular/Lymphatic: No significant  vascular findings are present. No enlarged abdominal or pelvic lymph nodes. Multiple shotty nonspecific central mesenteric lymph nodes. Reproductive: No acute findings. Other: No abdominal wall hernia or abnormality. No abdominopelvic ascites. Musculoskeletal: Bilateral L5-S1 pars articularis defects. No alignment abnormalities. Spondylosis of the lower lumbosacral spine. IMPRESSION: 1. Several ill-defined hypoattenuated masses throughout the liver, indeterminate. Metastatic disease is in the differential diagnosis. Further evaluation with contrast-enhanced abdominal MRI may be considered. 2. Small bowel obstruction to the level of the terminal ileum. Thickening of the terminal ileum, soft tissue mass cannot be excluded. 3. Scattered colonic diverticulosis without evidence of acute diverticulitis. 4. Bilateral L5-S1 pars articularis defects. No alignment abnormalities. Electronically Signed   By: Fidela Salisbury M.D.   On: 10/10/2020 16:53   IR ABDOMEN US LIMITED  Result Date: 10/13/2020 CLINICAL DATA:  Concern for metastatic colon cancer, now with multiple liver lesions worrisome for metastatic disease. Please perform ultrasound-guided liver lesion biopsy for tissue diagnostic purposes. EXAM: ULTRASOUND ABDOMEN LIMITED COMPARISON:  Abdominal MRI-10/11/2020; CT abdomen pelvis-10/10/2020 FINDINGS: Exhaustive sonographic evaluation performed by the dictating interventional radiologist failed to confidently replicate any of the worrisome liver lesions demonstrated on preceding cross-sectional imaging. As such, biopsy was deferred at this time. IMPRESSION: Non visualization of worrisome liver lesions potentially at least partially attributable to patient body habitus and hepatic steatosis. No biopsy attempted. PLAN: - Recommend waiting for results of endoscopic biopsy performed earlier today. - Ultimately, further evaluation could be performed with PET-CT imaging as indicated. - Note, if tissue diagnosis is  still required, would recommend attempting liver  lesion biopsy with CT guidance as indicated, though this too likely will be challenging secondary to patient body habitus and central location of both dominant liver lesions. Above discussed with Dr. Cristina Gong (GI) at the time of procedure completion. Electronically Signed   By: Sandi Mariscal M.D.   On: 10/13/2020 16:17      Subjective: Patient seen and examined the bedside today.  Hemodynamically stable for discharge.  Discharge planning discussed with the husband at the bedside  Discharge Exam: Vitals:   10/17/20 2230 10/18/20 0536  BP: 111/70 114/83  Pulse: 85 90  Resp: 17 18  Temp: 98.2 F (36.8 C) 98.7 F (37.1 C)  SpO2: 99% 94%   Vitals:   10/17/20 0500 10/17/20 1334 10/17/20 2230 10/18/20 0536  BP:  128/72 111/70 114/83  Pulse:  93 85 90  Resp:   17 18  Temp:  98.1 F (36.7 C) 98.2 F (36.8 C) 98.7 F (37.1 C)  TempSrc:  Oral Oral Oral  SpO2:  97% 99% 94%  Weight: 132.1 kg     Height:        General: Pt is alert, awake, not in acute distress,obese Cardiovascular: RRR, S1/S2 +, no rubs, no gallops Respiratory: CTA bilaterally, no wheezing, no rhonchi Abdominal: Soft, NT, ND, bowel sounds + Extremities: no edema, no cyanosis    The results of significant diagnostics from this hospitalization (including imaging, microbiology, ancillary and laboratory) are listed below for reference.     Microbiology: Recent Results (from the past 240 hour(s))  Resp Panel by RT-PCR (Flu A&B, Covid) Nasopharyngeal Swab     Status: None   Collection Time: 10/10/20  5:18 PM   Specimen: Nasopharyngeal Swab; Nasopharyngeal(NP) swabs in vial transport medium  Result Value Ref Range Status   SARS Coronavirus 2 by RT PCR NEGATIVE NEGATIVE Final    Comment: (NOTE) SARS-CoV-2 target nucleic acids are NOT DETECTED.  The SARS-CoV-2 RNA is generally detectable in upper respiratory specimens during the acute phase of infection. The  lowest concentration of SARS-CoV-2 viral copies this assay can detect is 138 copies/mL. A negative result does not preclude SARS-Cov-2 infection and should not be used as the sole basis for treatment or other patient management decisions. A negative result may occur with  improper specimen collection/handling, submission of specimen other than nasopharyngeal swab, presence of viral mutation(s) within the areas targeted by this assay, and inadequate number of viral copies(<138 copies/mL). A negative result must be combined with clinical observations, patient history, and epidemiological information. The expected result is Negative.  Fact Sheet for Patients:  EntrepreneurPulse.com.au  Fact Sheet for Healthcare Providers:  IncredibleEmployment.be  This test is no t yet approved or cleared by the [place] FDA and  has been authorized for detection and/or diagnosis of SARS-CoV-2 by FDA under an Emergency Use Authorization (EUA). This EUA will remain  in effect (meaning this test can be used) for the duration of the COVID-19 declaration under Section 564(b)(1) of the Act, 21 U.S.C.section 360bbb-3(b)(1), unless the authorization is terminated  or revoked sooner.       Influenza A by PCR NEGATIVE NEGATIVE Final   Influenza B by PCR NEGATIVE NEGATIVE Final    Comment: (NOTE) The Xpert Xpress SARS-CoV-2/FLU/RSV plus assay is intended as an aid in the diagnosis of influenza from Nasopharyngeal swab specimens and should not be used as a sole basis for treatment. Nasal washings and aspirates are unacceptable for Xpert Xpress SARS-CoV-2/FLU/RSV testing.  Fact Sheet for Patients: EntrepreneurPulse.com.au  Fact Sheet  for Healthcare Providers: IncredibleEmployment.be  This test is not yet approved or cleared by the [place] and has been authorized for detection and/or diagnosis of SARS-CoV-2 by FDA under  an Emergency Use Authorization (EUA). This EUA will remain in effect (meaning this test can be used) for the duration of the COVID-19 declaration under Section 564(b)(1) of the Act, 21 U.S.C. section 360bbb-3(b)(1), unless the authorization is terminated or revoked.  Performed at [facility], [address], [place], [place] [address]   Surgical PCR screen     Status: None   Collection Time: 10/14/20  6:37 PM   Specimen: Nasal Mucosa; Nasal Swab  Result Value Ref Range Status   MRSA, PCR NEGATIVE NEGATIVE Final   Staphylococcus aureus NEGATIVE NEGATIVE Final    Comment: (NOTE) The Xpert SA Assay (FDA approved for NASAL specimens in patients 53 years of age and older), is one component of a comprehensive surveillance program. It is not intended to diagnose infection nor to guide or monitor treatment. Performed at [facility], [place] [address], [place], [place] [address]      Labs: BNP (last 3 results) No results for input(s): BNP in the last 8760 hours. Basic Metabolic Panel: Recent Labs  Lab 10/12/20 0344 10/13/20 0312 10/14/20 0507 10/15/20 0409 10/16/20 0514 10/17/20 0424 10/18/20 0420  NA 139 140 138 139 133*  --  136  K 3.3* 4.0 3.5 4.0 3.5 3.3* 3.9  CL 106 107 105 105 100  --  103  CO2 27 27 25 24 27   --  25  GLUCOSE 141* 115* 91 109* 122*  --  103*  BUN 7 <5* <5* <5* <5*  --  6  CREATININE 0.76 0.84 0.73 0.81 0.71 0.79 0.85  CALCIUM 8.7* 9.0 8.4* 8.9 8.2*  --  8.5*  MG 1.9  --   --  1.7 1.6* 2.3 2.1  PHOS 2.9  --   --   --   --   --   --    Liver Function Tests: Recent Labs  Lab 10/12/20 0344  AST 39  ALT 51*  ALKPHOS 64  BILITOT 0.5  PROT 6.4*  ALBUMIN 3.0*   No results for input(s): LIPASE, AMYLASE in the last 168 hours. No results for input(s): AMMONIA in the last 168 hours. CBC: Recent Labs  Lab 10/15/20 0409 10/16/20 0514 10/17/20 0424 10/18/20 0420  WBC 5.7 11.4* 6.7 5.4  HGB 13.2 13.0 11.5* 11.2*  HCT  42.3 39.9 36.3 35.9*  MCV 96.6 94.1 96.0 95.5  PLT 287 298 263 268   Cardiac Enzymes: No results for input(s): CKTOTAL, CKMB, CKMBINDEX, TROPONINI in the last 168 hours. BNP: Invalid input(s): POCBNP CBG: No results for input(s): GLUCAP in the last 168 hours. D-Dimer No results for input(s): DDIMER in the last 72 hours. Hgb A1c No results for input(s): HGBA1C in the last 72 hours. Lipid Profile No results for input(s): CHOL, HDL, LDLCALC, TRIG, CHOLHDL, LDLDIRECT in the last 72 hours. Thyroid function studies No results for input(s): TSH, T4TOTAL, T3FREE, THYROIDAB in the last 72 hours.  Invalid input(s): FREET3 Anemia work up No results for input(s): VITAMINB12, FOLATE, FERRITIN, TIBC, IRON, RETICCTPCT in the last 72 hours. Urinalysis    Component Value Date/Time   COLORURINE YELLOW 10/10/2020 1916   APPEARANCEUR CLEAR 10/10/2020 1916   LABSPEC <1.005 (L) 10/10/2020 1916   PHURINE 5.0 10/10/2020 1916   GLUCOSEU NEGATIVE 10/10/2020 1916   HGBUR SMALL (A) 10/10/2020 1916  [place] NEGATIVE 10/10/2020 1916   KETONESUR 40 (A) 10/10/2020 1916   PROTEINUR NEGATIVE 10/10/2020 1916   UROBILINOGEN 1.0 08/20/2012 1345   NITRITE POSITIVE (A) 10/10/2020 1916   LEUKOCYTESUR NEGATIVE 10/10/2020 1916   Sepsis Labs Invalid input(s): PROCALCITONIN,  WBC,  LACTICIDVEN Microbiology Recent Results (from the past 240 hour(s))  Resp Panel by RT-PCR (Flu A&B, Covid) Nasopharyngeal Swab     Status: None   Collection Time: 10/10/20  5:18 PM   Specimen: Nasopharyngeal Swab; Nasopharyngeal(NP) swabs in vial transport medium  Result Value Ref Range Status   SARS Coronavirus 2 by RT PCR NEGATIVE NEGATIVE Final    Comment: (NOTE) SARS-CoV-2 target nucleic acids are NOT DETECTED.  The SARS-CoV-2 RNA is generally detectable in upper respiratory specimens during the acute phase of infection. The lowest concentration of SARS-CoV-2 viral copies this assay can detect is 138 copies/mL. A  negative result does not preclude SARS-Cov-2 infection and should not be used as the sole basis for treatment or other patient management decisions. A negative result may occur with  improper specimen collection/handling, submission of specimen other than nasopharyngeal swab, presence of viral mutation(s) within the areas targeted by this assay, and inadequate number of viral copies(<138 copies/mL). A negative result must be combined with clinical observations, patient history, and epidemiological information. The expected result is Negative.  Fact Sheet for Patients:  EntrepreneurPulse.com.au  Fact Sheet for Healthcare Providers:  IncredibleEmployment.be  This test is no t yet approved or cleared by the [place] FDA and  has been authorized for detection and/or diagnosis of SARS-CoV-2 by FDA under an Emergency Use Authorization (EUA). This EUA will remain  in effect (meaning this test can be used) for the duration of the COVID-19 declaration under Section 564(b)(1) of the Act, 21 U.S.C.section 360bbb-3(b)(1), unless the authorization is terminated  or revoked sooner.       Influenza A by PCR NEGATIVE NEGATIVE Final   Influenza B by PCR NEGATIVE NEGATIVE Final    Comment: (NOTE) The Xpert Xpress SARS-CoV-2/FLU/RSV plus assay is intended as an aid in the diagnosis of influenza from Nasopharyngeal swab specimens and should not be used as a sole basis for treatment. Nasal washings and aspirates are unacceptable for Xpert Xpress SARS-CoV-2/FLU/RSV testing.  Fact Sheet for Patients: EntrepreneurPulse.com.au  Fact Sheet for Healthcare Providers: IncredibleEmployment.be  This test is not yet approved or cleared by the [place] FDA and has been authorized for detection and/or diagnosis of SARS-CoV-2 by FDA under an Emergency Use Authorization (EUA). This EUA will remain in effect (meaning this test can  be used) for the duration of the COVID-19 declaration under Section 564(b)(1) of the Act, 21 U.S.C. section 360bbb-3(b)(1), unless the authorization is terminated or revoked.  Performed at [facility], [address] [place], [place] [address]   Surgical PCR screen     Status: None   Collection Time: 10/14/20  6:37 PM   Specimen: Nasal Mucosa; Nasal Swab  Result Value Ref Range Status   MRSA, PCR NEGATIVE NEGATIVE Final   Staphylococcus aureus NEGATIVE NEGATIVE Final    Comment: (NOTE) The Xpert SA Assay (FDA approved for NASAL specimens in patients 32 years of age and older), is one component of a comprehensive surveillance program. It is not intended to diagnose infection nor to guide or monitor treatment. Performed at [facility], [place] [address] [place], [place] [address]     Please note: You were cared for by a hospitalist during your hospital  stay. Once you are discharged, your primary care physician will handle any further medical issues. Please note that NO REFILLS for any discharge medications will be authorized once you are discharged, as it is imperative that you return to your primary care physician (or establish a relationship with a primary care physician if you do not have one) for your post hospital discharge needs so that they can reassess your need for medications and monitor your lab values.    Time coordinating discharge: 40 minutes  SIGNED:   Shelly Coss, MD  [facility] 10/18/2020, 11:29 AM Pager [phone]  If 7PM-7AM, please contact night-coverage [email or web address] Password [email or web address]

## 2020-10-18 NOTE — Anesthesia Postprocedure Evaluation (Signed)
Anesthesia Post Note  Patient: Lauren Mcpherson  Procedure(s) Performed: LAPAROSCOPIC RIGHT COLECTOMY, LAPAROSCOPIC LIVER BIOPSY, LAPAROSCOPIC TAP BLOCK (Right)     Patient location during evaluation: PACU Anesthesia Type: General Level of consciousness: sedated and patient cooperative Pain management: pain level controlled Vital Signs Assessment: post-procedure vital signs reviewed and stable Respiratory status: spontaneous breathing Cardiovascular status: stable Anesthetic complications: no   No notable events documented.  Last Vitals:  Vitals:   10/17/20 2230 10/18/20 0536  BP: 111/70 114/83  Pulse: 85 90  Resp: 17 18  Temp: 36.8 C 37.1 C  SpO2: 99% 94%    Last Pain:  Vitals:   10/18/20 0731  TempSrc:   PainSc: [place]

## 2020-10-19 ENCOUNTER — Other Ambulatory Visit: Payer: Self-pay

## 2020-10-19 ENCOUNTER — Encounter: Payer: Self-pay | Admitting: *Deleted

## 2020-10-19 DIAGNOSIS — C180 Malignant neoplasm of cecum: Secondary | ICD-10-CM

## 2020-10-19 NOTE — Progress Notes (Signed)
Reached out to Lauren Mcpherson to introduce myself as the office RN Navigator and explain our new patient process. Reviewed the reason for their referral and scheduled their new patient appointment along with labs. Provided address and directions to the office including call back phone number. Reviewed with patient any concerns they may have or any possible barriers to attending their appointment.   Informed patient about my role as a navigator and that I will meet with them prior to their New Patient appointment and more fully discuss what services I can provide. At this time patient has no further questions or needs.    Oncology Nurse Navigator Documentation  Oncology Nurse Navigator Flowsheets 10/19/2020  Abnormal Finding Date 10/10/2020  Confirmed Diagnosis Date 10/15/2020  Diagnosis Status Additional Work Up  Phase of Treatment Surgery  Surgery Actual Start Date: 10/15/2020  Navigator Follow Up Date: 11/01/2020  Navigator Follow Up Reason: New Patient Appointment  Navigator Location [facility]  Referral Date to RadOnc/MedOnc 10/18/2020  Navigator Encounter Type Introductory Phone Call  Treatment Initiated Date 10/15/2020  Patient Visit Type MedOnc  Treatment Phase Active Tx  Barriers/Navigation Needs Coordination of Care;Education  Education Other  Interventions Coordination of Care;Education;Psycho-Social Support  Acuity Level 2-Minimal Needs (1-2 Barriers Identified)  Coordination of Care Appts  Education Method Verbal  Time Spent with Patient 45

## 2020-10-19 NOTE — Progress Notes (Signed)
Physical Therapy Treatment Patient Details Name: Lauren Mcpherson MRN: [id number] DOB: 18-Nov-1965 Today's Date: 10/19/2020       PT Comments    Patient making excellent progress in acute setting and mobilizing at mod independent to supervision level with transfers, gait and stair mobility. Pt educated on HEP for initial basic strengthening and encouraged to participate in aquatics classes at her [employer] as pt expressed interest.  She is safe to mobilize independently in room and with RN/NT staff in hallways. Acute PT will sign off at this time.     10/18/20 0800  PT Visit Information  Last PT Received On 10/18/20  Assistance Needed +1  History of Present Illness Pt s/p laparoscopic assisted R colectomy and liver biopsy 2* cecal adenocarcinoma and liver lesion.  Subjective Data  Patient Stated Goal Regain IND  Precautions  Precautions Fall  Restrictions  Weight Bearing Restrictions No  Pain Assessment  Pain Assessment No/denies pain  Cognition  Arousal/Alertness Awake/alert  Behavior During Therapy WFL for tasks assessed/performed  Overall Cognitive Status Within Functional Limits for tasks assessed  Bed Mobility  Overal bed mobility Modified Independent  Transfers  Overall transfer level Modified independent  Ambulation/Gait  Ambulation/Gait assistance Supervision  Gait Distance (Feet) 600 Feet  Assistive device IV Pole;None  Gait Pattern/deviations WFL(Within Functional Limits);Decreased stride length;Wide base of support  General Gait Details pt overall steady with gait, initially with IV pole and then ambulating ~75% of distance without. no LOB observed.  Gait velocity decr  Stairs Yes  Stairs assistance Supervision  Stair Management Two rails;Step to pattern;Forwards;Alternating pattern  Number of Stairs 4  General stair comments pt alternating step pattern to ascend and step to pattern to descend steps.  Balance  Overall balance assessment Mild deficits observed, not  formally tested  Exercises  Exercises General Lower Extremity  General Exercises - Lower Extremity  Hip Flexion/Marching AROM;Both;Standing;10 reps  Heel Raises AROM;Both;15 reps;Standing  Mini-Sqauts AROM;Both;Standing;10 reps (Sit<>Stand; no UE use for power up)  Gluteal Sets AROM;Both;Standing;10 reps (hip extension)  Hip ABduction/ADduction AROM;Both;Standing;10 reps  PT - End of Session  Equipment Utilized During Treatment Gait belt  Activity Tolerance Patient tolerated treatment well  Patient left in chair;with call bell/phone within reach  Nurse Communication Mobility status   PT - Assessment/Plan  PT Plan Current plan remains appropriate;Other (comment) (pt no longer requires skilled Acute PT services)  PT Visit Diagnosis Unsteadiness on feet (R26.81);Difficulty in walking, not elsewhere classified (R26.2)  PT Frequency (ACUTE ONLY) Min 3X/week  Follow Up Recommendations No PT follow up  PT equipment None recommended by PT  AM-PAC PT "6 Clicks" Mobility Outcome Measure (Version 2)  Help needed turning from your back to your side while in a flat bed without using bedrails? 4  Help needed moving from lying on your back to sitting on the side of a flat bed without using bedrails? 4  Help needed moving to and from a bed to a chair (including a wheelchair)? 4  Help needed standing up from a chair using your arms (e.g., wheelchair or bedside chair)? 4  Help needed to walk in hospital room? 4  Help needed climbing 3-5 steps with a railing?  3  6 Click Score 23  Consider Recommendation of Discharge To: Home with no services  PT Goal Progression  Progress towards PT goals Progressing toward goals  Acute Rehab PT Goals  PT Goal Formulation With patient  Time For Goal Achievement 10/29/20  Potential to Achieve Goals Good  PT  Time Calculation  PT Start Time (ACUTE ONLY) 0849  PT Stop Time (ACUTE ONLY) 0910  PT Time Calculation (min) (ACUTE ONLY) 21 min  PT General Charges  $$  ACUTE PT VISIT 1 Visit  PT Treatments  $Gait Training 8-22 mins     Verner Mould, DPT Acute Rehabilitation Services Office [phone] Pager [phone]    NELLA BOTSFORD 10/19/2020, 8:47 AM

## 2020-10-27 LAB — SURGICAL PATHOLOGY

## 2020-11-01 ENCOUNTER — Inpatient Hospital Stay (HOSPITAL_BASED_OUTPATIENT_CLINIC_OR_DEPARTMENT_OTHER): Payer: 59 | Admitting: Hematology & Oncology

## 2020-11-01 ENCOUNTER — Encounter: Payer: Self-pay | Admitting: Hematology & Oncology

## 2020-11-01 ENCOUNTER — Other Ambulatory Visit: Payer: Self-pay

## 2020-11-01 ENCOUNTER — Inpatient Hospital Stay: Payer: 59 | Attending: Hematology & Oncology

## 2020-11-01 VITALS — BP 125/82 | HR 64 | Temp 97.7°F | Resp 20 | Ht 63.0 in | Wt 284.1 lb

## 2020-11-01 DIAGNOSIS — Z5111 Encounter for antineoplastic chemotherapy: Secondary | ICD-10-CM | POA: Insufficient documentation

## 2020-11-01 DIAGNOSIS — C180 Malignant neoplasm of cecum: Secondary | ICD-10-CM

## 2020-11-01 DIAGNOSIS — C787 Secondary malignant neoplasm of liver and intrahepatic bile duct: Secondary | ICD-10-CM | POA: Insufficient documentation

## 2020-11-01 DIAGNOSIS — Z452 Encounter for adjustment and management of vascular access device: Secondary | ICD-10-CM | POA: Diagnosis not present

## 2020-11-01 DIAGNOSIS — Z7189 Other specified counseling: Secondary | ICD-10-CM

## 2020-11-01 HISTORY — DX: Other specified counseling: Z71.89

## 2020-11-01 LAB — CBC WITH DIFFERENTIAL (CANCER CENTER ONLY)
Abs Immature Granulocytes: 0.01 10*3/uL (ref 0.00–0.07)
Basophils Absolute: 0 10*3/uL (ref 0.0–0.1)
Basophils Relative: 1 %
Eosinophils Absolute: 0.4 10*3/uL (ref 0.0–0.5)
Eosinophils Relative: 9 %
HCT: 40 % (ref 36.0–46.0)
Hemoglobin: 12.8 g/dL (ref 12.0–15.0)
Immature Granulocytes: 0 %
Lymphocytes Relative: 38 %
Lymphs Abs: 1.8 10*3/uL (ref 0.7–4.0)
MCH: 29.9 pg (ref 26.0–34.0)
MCHC: 32 g/dL (ref 30.0–36.0)
MCV: 93.5 fL (ref 80.0–100.0)
Monocytes Absolute: 0.3 10*3/uL (ref 0.1–1.0)
Monocytes Relative: 6 %
Neutro Abs: 2.3 10*3/uL (ref 1.7–7.7)
Neutrophils Relative %: 46 %
Platelet Count: 365 10*3/uL (ref 150–400)
RBC: 4.28 MIL/uL (ref 3.87–5.11)
RDW: 15.1 % (ref 11.5–15.5)
WBC Count: 4.9 10*3/uL (ref 4.0–10.5)
nRBC: 0 % (ref 0.0–0.2)

## 2020-11-01 LAB — CMP (CANCER CENTER ONLY)
ALT: 27 U/L (ref 0–44)
AST: 20 U/L (ref 15–41)
Albumin: 3.8 g/dL (ref 3.5–5.0)
Alkaline Phosphatase: 81 U/L (ref 38–126)
Anion gap: 7 (ref 5–15)
BUN: 14 mg/dL (ref 6–20)
CO2: 29 mmol/L (ref 22–32)
Calcium: 9.3 mg/dL (ref 8.9–10.3)
Chloride: 101 mmol/L (ref 98–111)
Creatinine: 0.75 mg/dL (ref 0.44–1.00)
GFR, Estimated: >60 mL/min (ref >60)
Glucose, Bld: 115 mg/dL — ABNORMAL HIGH (ref 70–99)
Potassium: 3.9 mmol/L (ref 3.5–5.1)
Sodium: 137 mmol/L (ref 135–145)
Total Bilirubin: 0.5 mg/dL (ref 0.3–1.2)
Total Protein: 6.6 g/dL (ref 6.5–8.1)

## 2020-11-01 LAB — LACTATE DEHYDROGENASE: LDH: 144 U/L (ref 98–192)

## 2020-11-01 NOTE — Progress Notes (Signed)
START ON PATHWAY REGIMEN - Colorectal     A cycle is every 14 days:     Bevacizumab-xxxx      Irinotecan      Oxaliplatin      Leucovorin      Fluorouracil   **Always confirm dose/schedule in your pharmacy ordering system**  Patient Characteristics: Distant Metastases, Nonsurgical Candidate, KRAS/NRAS Mutation Positive/Unknown (BRAF V600 Wild-Type/Unknown), Standard Cytotoxic Therapy, First Line Standard Cytotoxic Therapy, Bevacizumab Eligible, PS = 0,1 Tumor Location: Colon Therapeutic Status: Distant Metastases Microsatellite/Mismatch Repair Status: MSS/pMMR BRAF Mutation Status: Awaiting Test Results KRAS/NRAS Mutation Status: Awaiting Test Results Standard Cytotoxic Line of Therapy: First Line Standard Cytotoxic Therapy ECOG Performance Status: 0 Bevacizumab Eligibility: Eligible Intent of Therapy: Non-Curative / Palliative Intent, Discussed with Patient

## 2020-11-01 NOTE — Progress Notes (Signed)
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  HPI: Lauren Mcpherson is a very charming 55 year old African-American female.  She just had a [birth year] on [birth year].  Unfortunately, she presented with abdominal pain recently.  She initially had no problems with vomiting.  The pain was sort of in her upper abdomen.  There is no fever.  There is no bleeding.  There is no diarrhea.  She had no weight loss or weight gain.  The  pain seemed to go away.  However, the pain recurred.  This time, she began to have vomiting.  She ultimately she ended up in the emergency room at [facility] scan was done on 10/10/2020.  This showed some ill-defined lesions in the liver.  She had small bowel obstruction at the level of the terminal ileum.  There is thickening of the terminal ileum.  She underwent a colonoscopy.  She never had a colonoscopy before.  The colonoscopy unfortunately showed a mass in the cecum.  The pathology report [id number]) showed an invasive colorectal adenocarcinoma.  She had MRI of the abdomen on 10/11/2020.  This showed multiple liver lesions.  She had the terminal ileum thickening.  There was some nonenlarged adjacent lymph nodes.  She had a preop CEA of 38.  She ultimately underwent a resection of the mass and liver biopsy.  This was done on 10/15/2020.  The pathology report ([id number]) showed metastatic adenocarcinoma to the liver.  She had a 4.5 cm moderately differentiated colon cancer in the right colon.  She had 4/18 lymph nodes that were positive.  There were 3 tumor deposits.  The tumor was MMR/MSI normal.  She has recovered quite nicely.  Dr. Redmond Pulling did a wonderful laparoscopic approach.  She did have a CT of the chest.  This is on 10/14/2020.  There is a single 4 mm right lower lobe pulmonary  nodule that was indeterminant.  Otherwise everything else looked normal.  She feels better.  She is eating better.  She is going to the bathroom.  There is no history of colon cancer in the family.  She has a performance status right now of ECOG 0.  History reviewed. No pertinent past medical history.:   Past Surgical History:  Procedure Laterality Date   BIOPSY  10/13/2020   Procedure: BIOPSY;  Surgeon: Ronald Lobo, MD;  Location: [facility];  Service: Endoscopy;;   CHOLECYSTECTOMY     COLONOSCOPY WITH PROPOFOL N/A 10/13/2020   Procedure: COLONOSCOPY WITH PROPOFOL;  Surgeon: Ronald Lobo, MD;  Location: [facility];  Service: Endoscopy;  Laterality: N/A;   LAPAROSCOPIC RIGHT COLECTOMY Right 10/15/2020   Procedure: LAPAROSCOPIC RIGHT COLECTOMY, LAPAROSCOPIC LIVER BIOPSY, LAPAROSCOPIC TAP BLOCK;  Surgeon: Greer Pickerel, MD;  Location: [facility];  Service: General;  Laterality: Right;  90 MINUTES   POLYPECTOMY  10/13/2020   Procedure: POLYPECTOMY;  Surgeon: Ronald Lobo, MD;  Location: [facility];  Service: Endoscopy;;   TONSILLECTOMY    :   Current Outpatient Medications:    oxyCODONE (OXY IR/ROXICODONE) 5 MG immediate release tablet, Take 1 tablet (5 mg total) by mouth every 6 (six) hours as needed for breakthrough pain. (Patient not taking: Reported on 11/01/2020), Disp: 15 tablet, Rfl: 0:  :  No Known Allergies:  History reviewed. No pertinent family  history.:   Social History   Socioeconomic History   Marital status: Married    Spouse name: Not on file   Number of children: Not on file   Years of education: Not on file   Highest education level: Not on file  Occupational History   Not on file  Tobacco Use   Smoking status: Former    Packs/day: 0.50    Years: 20.00    Pack years: 10.00    Types: Cigarettes    Quit date: 2009    Years since quitting: 13.5   Smokeless tobacco: Never  Vaping Use   Vaping Use: Never used  Substance and Sexual Activity    Alcohol use: Yes    Comment: occ   Drug use: No   Sexual activity: Yes    Birth control/protection: None  Other Topics Concern   Not on file  Social History Narrative   Not on file   Social Determinants of Health   Financial Resource Strain: Not on file  Food Insecurity: Not on file  Transportation Needs: Not on file  Physical Activity: Not on file  Stress: Not on file  Social Connections: Not on file  Intimate Partner Violence: Not on file  :  Review of Systems  Constitutional: Negative.   HENT: Negative.    Eyes: Negative.   Respiratory: Negative.    Cardiovascular: Negative.   Gastrointestinal: Negative.   Genitourinary: Negative.   Musculoskeletal: Negative.   Skin: Negative.   Neurological: Negative.   Endo/Heme/Allergies: Negative.   Psychiatric/Behavioral: Negative.      Exam:  This is a obese African-American female in no obvious distress.  Vital signs show temperature of 97.7.  Pulse 64.  Blood pressure 125/82.  Weight is 284 pounds.  Head and neck exam shows no scleral icterus.  She has no adenopathy in the neck.  There is no supraclavicular lymph nodes.  Lungs are clear bilaterally.  Cardiac exam regular rate and rhythm with no murmurs, rubs or bruits.  Abdomen is soft.  She has good bowel sounds.  There is no fluid wave.  She has a healing laparoscopic scar.  She has a midline vertical incision above the umbilicus.  There is a slight open area.  There is no discharge.  There is no fluid wave.  There is no obvious abdominal mass.  She has no palpable liver or spleen tip.  Back exam shows no tenderness over the spine, ribs or hips.  Extremities shows no clubbing, cyanosis or edema.  Skin exam shows no rashes, ecchymoses or petechia.  Neurological exam is nonfocal. @IPVITALS @    Recent Labs    11/01/20 1121  WBC 4.9  HGB 12.8  HCT 40.0  PLT 365    Recent Labs    11/01/20 1121  NA 137  K 3.9  CL 101  CO2 29  GLUCOSE 115*  BUN 14  CREATININE 0.75   CALCIUM 9.3    Blood smear review: None  Pathology: See above    Assessment and Plan: Lauren Mcpherson is a very nice 55 year old African-American female with metastatic adenocarcinoma of the cecum.  She has liver metastasis.  It will be interesting to see what her CEA level is..  She underwent resection of the primary because of obstruction.  She would be a good candidate for systemic chemotherapy.  I talked to her and her husband about this.  I told him that she has stage IV disease.  Because of this, we can treat the cancer  but I still think we can cure it.  However, given that she just has the liver metastasis, we can certainly see about being aggressive.  I suppose we might be able to get a good response that might be prolonged.  Sometimes, we utilize intrahepatic therapy after systemic therapy.  I do think that she would be a candidate for molecular profiling.  We really need to see what the K-ras status is.  I need to see what her BRAF and HER2 status are.  We will send off the molecular markers.  I would go ahead and get her set up with systemic chemotherapy.  I think that the chances are more than likely that we will have to use systemic chemotherapy.  Again she is in good shape so I would consider using FOLFOXIRI.  I can consider adding Avastin but I think we can hold on this for right now.  We can see how she responds to chemotherapy by itself and then maybe add Avastin as she gets further out from surgery.  Again she will need to have a Port-A-Cath placed.  We will have to get this set up.  I talked her about the side effects of treatment.  I explained that she might lose her hair.  I talked about the palato pharyngeal dysesthesia.  I told that she cannot eat or drink anything cold for about 3 days after each treatment.  I explained about the effects of the infusional 5-FU.  I told her to make sure that she still protects her skin.  I talked her about the palmar plantar erythema.    She  understands all this.  We will try to get started with treatment in a week.  I do not see that we had to do any scans on her.  I do not think a PET scan would really add to what we know already.  I will give her 4 cycles of FOLFOXIRI and then repeat her scans and see how everything looks.  She is very very nice.  I would like to think that she will do okay.  She is in good shape.  Her husband is very very nice.  I will plan to see her back when she starts her second cycle of chemotherapy.

## 2020-11-02 ENCOUNTER — Encounter: Payer: Self-pay | Admitting: *Deleted

## 2020-11-02 ENCOUNTER — Other Ambulatory Visit: Payer: Self-pay | Admitting: *Deleted

## 2020-11-02 ENCOUNTER — Telehealth: Payer: Self-pay

## 2020-11-02 DIAGNOSIS — C180 Malignant neoplasm of cecum: Secondary | ICD-10-CM

## 2020-11-02 LAB — IRON AND TIBC
Iron: 88 ug/dL (ref 41–142)
Saturation Ratios: 40 % (ref 21–57)
TIBC: 219 ug/dL — ABNORMAL LOW (ref 236–444)
UIBC: 131 ug/dL (ref 120–384)

## 2020-11-02 LAB — CEA (IN HOUSE-CHCC): CEA (CHCC-In House): 9.66 ng/mL — ABNORMAL HIGH (ref 0.00–5.00)

## 2020-11-02 LAB — FERRITIN: Ferritin: 93 ng/mL (ref 11–307)

## 2020-11-02 MED ORDER — PROCHLORPERAZINE MALEATE 10 MG PO TABS: 10.0000 mg | ORAL_TABLET | Freq: Four times a day (QID) | ORAL | 1 refills | Status: DC | PRN

## 2020-11-02 MED ORDER — LIDOCAINE-PRILOCAINE 2.5-2.5 % EX CREA: TOPICAL_CREAM | CUTANEOUS | 3 refills | Status: DC

## 2020-11-02 MED ORDER — ONDANSETRON HCL 8 MG PO TABS: 8.0000 mg | ORAL_TABLET | Freq: Two times a day (BID) | ORAL | 1 refills | Status: DC | PRN

## 2020-11-02 MED ORDER — LOPERAMIDE HCL 2 MG PO TABS: ORAL_TABLET | ORAL | 1 refills | Status: DC

## 2020-11-02 MED ORDER — DEXAMETHASONE 4 MG PO TABS: 8.0000 mg | ORAL_TABLET | Freq: Every day | ORAL | 5 refills | Status: DC

## 2020-11-02 NOTE — Progress Notes (Signed)
This navigator out of the office for patient's new patient appointment.   After seeing Dr Marin Olp she requires the following:  Per Dr Antonieta Pert request, [facility] testing request sent on specimen [id number] DOS 10/15/2020  Port scheduled for 11/08/2020 at [facility] scheduled for 11/09/2020 at [facility] and spoke with patient. She is aware of both appointments, including time, date and location. Prep instructions given to patient regarding port, including NPO after 8am and needing a driver. Patient confirmed with teach back. Patient also knows we will call to schedule her chemotherapy once we get authorization from her insurance.   Oncology Nurse Navigator Documentation  Oncology Nurse Navigator Flowsheets 11/02/2020  Abnormal Finding Date -  Confirmed Diagnosis Date -  Diagnosis Status -  Phase of Treatment Chemo  Chemotherapy Pending- Reason: Authorization  Surgery Actual Start Date: -  Navigator Follow Up Date: 11/09/2020  Navigator Follow Up Reason: Education  Navigator Location [facility]  Referral Date to RadOnc/MedOnc -  Navigator Encounter Type Appt/Treatment Plan Review;Telephone;Molecular Studies  Telephone Appt Confirmation/Clarification;Education;Outgoing Call  Treatment Initiated Date -  Patient Visit Type MedOnc  Treatment Phase Active Tx  Barriers/Navigation Needs Coordination of Care;Education  Education Other  Interventions Coordination of Care;Education;Psycho-Social Support  Acuity Level 2-Minimal Needs (1-2 Barriers Identified)  Coordination of Care Appts;Radiology;Pathology  Education Method Teach-back;Verbal  Support Groups/Services Friends and Family  Time Spent with Patient 14

## 2020-11-03 NOTE — Progress Notes (Signed)
Pharmacist Chemotherapy Monitoring - Initial Assessment    Anticipated start date: 11/11/10   The following has been reviewed per standard work regarding the patient's treatment regimen: The patient's diagnosis, treatment plan and drug doses, and organ/hematologic function Lab orders and baseline tests specific to treatment regimen  The treatment plan start date, drug sequencing, and pre-medications Prior authorization status  Patient's documented medication list, including drug-drug interaction screen and prescriptions for anti-emetics and supportive care specific to the treatment regimen The drug concentrations, fluid compatibility, administration routes, and timing of the medications to be used The patient's access for treatment and lifetime cumulative dose history, if applicable  The patient's medication allergies and previous infusion related reactions, if applicable   Changes made to treatment plan:  N/A  Follow up needed:  Pending authorization for treatment    Lauren Mcpherson, Lauren Mcpherson, [facility], 11/03/2020  7:37 AM

## 2020-11-05 ENCOUNTER — Other Ambulatory Visit: Payer: Self-pay | Admitting: Radiology

## 2020-11-08 ENCOUNTER — Other Ambulatory Visit: Payer: Self-pay | Admitting: Hematology & Oncology

## 2020-11-08 ENCOUNTER — Other Ambulatory Visit: Payer: Self-pay

## 2020-11-08 ENCOUNTER — Encounter (HOSPITAL_COMMUNITY): Payer: Self-pay

## 2020-11-08 ENCOUNTER — Ambulatory Visit (HOSPITAL_COMMUNITY)
Admission: RE | Admit: 2020-11-08 | Discharge: 2020-11-08 | Disposition: A | Discharge status: Home / Self Care | Payer: 59 | Source: Ambulatory Visit | Attending: Hematology & Oncology | Admitting: Hematology & Oncology

## 2020-11-08 DIAGNOSIS — C180 Malignant neoplasm of cecum: Secondary | ICD-10-CM | POA: Diagnosis not present

## 2020-11-08 DIAGNOSIS — Z9049 Acquired absence of other specified parts of digestive tract: Secondary | ICD-10-CM | POA: Insufficient documentation

## 2020-11-08 DIAGNOSIS — Z87891 Personal history of nicotine dependence: Secondary | ICD-10-CM | POA: Insufficient documentation

## 2020-11-08 HISTORY — DX: Malignant (primary) neoplasm, unspecified: C80.1

## 2020-11-08 HISTORY — PX: IR IMAGING GUIDED PORT INSERTION: IMG5740

## 2020-11-08 MED ORDER — SODIUM CHLORIDE 0.9 % IV SOLN: INTRAVENOUS | Status: DC

## 2020-11-08 MED ORDER — FENTANYL CITRATE (PF) 100 MCG/2ML IJ SOLN
INTRAMUSCULAR | Status: AC
  Filled 2020-11-08: qty 2

## 2020-11-08 MED ORDER — MIDAZOLAM HCL 2 MG/2ML IJ SOLN
INTRAMUSCULAR | Status: AC
  Filled 2020-11-08: qty 4

## 2020-11-08 MED ORDER — MIDAZOLAM HCL 2 MG/2ML IJ SOLN
INTRAMUSCULAR | Status: AC | PRN
  Administered 2020-11-08 (×4): 1 mg via INTRAVENOUS

## 2020-11-08 MED ORDER — HEPARIN SOD (PORK) LOCK FLUSH 100 UNIT/ML IV SOLN
INTRAVENOUS | Status: AC
  Filled 2020-11-08: qty 5

## 2020-11-08 MED ORDER — HEPARIN SOD (PORK) LOCK FLUSH 100 UNIT/ML IV SOLN
INTRAVENOUS | Status: AC | PRN
  Administered 2020-11-08: 500 [IU] via INTRAVENOUS

## 2020-11-08 MED ORDER — LIDOCAINE HCL (PF) 1 % IJ SOLN
INTRAMUSCULAR | Status: AC | PRN
  Administered 2020-11-08 (×2): 10 mL via INTRADERMAL

## 2020-11-08 MED ORDER — FENTANYL CITRATE (PF) 100 MCG/2ML IJ SOLN
INTRAMUSCULAR | Status: AC | PRN
  Administered 2020-11-08 (×2): 50 ug via INTRAVENOUS

## 2020-11-08 NOTE — Procedures (Signed)
Interventional Radiology Procedure Note  Procedure: Single Lumen Power Port Placement    Access:  Right IJ vein.  Findings: Catheter tip positioned at SVC/RA junction. Port is ready for immediate use.   Complications: None  EBL: < 10 mL  Recommendations:  - Ok to shower in 24 hours - Do not submerge for 7 days - Routine line care   Kameren Baade T. Cathy Crounse, M.D Pager:  [phone]   

## 2020-11-08 NOTE — H&P (Signed)
Chief Complaint: Patient was seen in consultation today for port at the request of Ennever,Peter R  Referring Physician(s): Ennever,Peter R  Supervising Physician: Aletta Edouard  Patient Status: [facility] - Out-pt  History of Present Illness: Lauren Mcpherson is a 55 y.o. female with newly diagnosed colon cancer. She is to start chemotherapy this week and is referred for port placement. PMHx, meds, labs, imaging, allergies reviewed. Feels well, no recent fevers, chills, illness. Has been NPO today as directed.   Past Medical History:  Diagnosis Date   Goals of care, counseling/discussion 11/01/2020    Past Surgical History:  Procedure Laterality Date   BIOPSY  10/13/2020   Procedure: BIOPSY;  Surgeon: Ronald Lobo, MD;  Location: [facility];  Service: Endoscopy;;   CHOLECYSTECTOMY     COLONOSCOPY WITH PROPOFOL N/A 10/13/2020   Procedure: COLONOSCOPY WITH PROPOFOL;  Surgeon: Ronald Lobo, MD;  Location: [facility];  Service: Endoscopy;  Laterality: N/A;   LAPAROSCOPIC RIGHT COLECTOMY Right 10/15/2020   Procedure: LAPAROSCOPIC RIGHT COLECTOMY, LAPAROSCOPIC LIVER BIOPSY, LAPAROSCOPIC TAP BLOCK;  Surgeon: Greer Pickerel, MD;  Location: [facility];  Service: General;  Laterality: Right;  90 MINUTES   POLYPECTOMY  10/13/2020   Procedure: POLYPECTOMY;  Surgeon: Ronald Lobo, MD;  Location: [facility];  Service: Endoscopy;;   TONSILLECTOMY      Allergies: Patient has no known allergies.  Medications: Prior to Admission medications   Medication Sig Start Date End Date Taking? Authorizing Provider  dexamethasone (DECADRON) 4 MG tablet Take 2 tablets (8 mg total) by mouth daily. Start the day after chemotherapy for 3 days. Take with food. 11/02/20   [name], MD  lidocaine-prilocaine (EMLA) cream Apply to affected area once 11/02/20   [name], MD  loperamide (IMODIUM A-D) 2 MG tablet Take 2 at onset of diarrhea, then 1 every 2hrs until 12hr without a BM. May take  2 tab every 4hrs at bedtime. If diarrhea recurs repeat. 11/02/20   [name], MD  ondansetron (ZOFRAN) 8 MG tablet Take 1 tablet (8 mg total) by mouth 2 (two) times daily as needed. Start on day 3 after chemotherapy. 11/02/20   [name], MD  oxyCODONE (OXY IR/ROXICODONE) 5 MG immediate release tablet Take 1 tablet (5 mg total) by mouth every 6 (six) hours as needed for breakthrough pain. Patient not taking: Reported on 11/01/2020 10/18/20   Jillyn Ledger, PA-C  prochlorperazine (COMPAZINE) 10 MG tablet Take 1 tablet (10 mg total) by mouth every 6 (six) hours as needed (Nausea or vomiting). 11/02/20   [name], MD     No family history on file.  Social History   Socioeconomic History   Marital status: Married    Spouse name: Not on file   Number of children: Not on file   Years of education: Not on file   Highest education level: Not on file  Occupational History   Not on file  Tobacco Use   Smoking status: Former    Packs/day: 0.50    Years: 20.00    Pack years: 10.00    Types: Cigarettes    Quit date: 2009    Years since quitting: 13.5   Smokeless tobacco: Never  Vaping Use   Vaping Use: Never used  Substance and Sexual Activity   Alcohol use: Yes    Comment: occ   Drug use: No   Sexual activity: Yes    Birth control/protection: None  Other Topics Concern   Not on  file  Social History Narrative   Not on file   Social Determinants of Health   Financial Resource Strain: Not on file  Food Insecurity: Not on file  Transportation Needs: Not on file  Physical Activity: Not on file  Stress: Not on file  Social Connections: Not on file    Review of Systems: A 12 point ROS discussed and pertinent positives are indicated in the HPI above.  All other systems are negative.  Review of Systems  Vital Signs: There were no vitals taken for this visit.  Physical Exam Constitutional:      General: She is not in acute distress.    Appearance: Normal  appearance. She is not ill-appearing.  HENT:     Mouth/Throat:     Mouth: Mucous membranes are moist.     Pharynx: Oropharynx is clear.  Cardiovascular:     Rate and Rhythm: Normal rate and regular rhythm.     Heart sounds: Normal heart sounds.  Pulmonary:     Effort: Pulmonary effort is normal. No respiratory distress.     Breath sounds: Normal breath sounds.  Skin:    General: Skin is warm and dry.  Neurological:     General: No focal deficit present.     Mental Status: She is alert and oriented to person, place, and time.  Psychiatric:        Mood and Affect: Mood normal.        Thought Content: Thought content normal.        Judgment: Judgment normal.      Imaging: CT CHEST W CONTRAST  Result Date: 10/14/2020 CLINICAL DATA:  Metastatic colon cancer.  Staging CT scan. EXAM: CT CHEST WITH CONTRAST TECHNIQUE: Multidetector CT imaging of the chest was performed during intravenous contrast administration. CONTRAST:  54mL OMNIPAQUE IOHEXOL 300 MG/ML  SOLN COMPARISON:  CT scan 10/10/2020 FINDINGS: Cardiovascular: The heart is normal in size. No pericardial effusion. The aorta is normal in caliber. No dissection. No atherosclerotic calcifications. The branch vessels are patent. No definite coronary artery calcifications. Mediastinum/Nodes: No mediastinal or hilar mass or lymphadenopathy. The esophagus is unremarkable. Lungs/Pleura: The lungs are clear of an acute process. No infiltrates, edema or effusions. 4 mm nodule noted in the right lower lobe on image number 72/5. This is an indeterminate finding but I do not see any other pulmonary nodules to suggest pulmonary metastatic disease. Recommend attention on future/follow-up scans. Upper Abdomen: Liver lesions are not well demonstrated on this study. The gallbladder is surgically absent. No common bile duct dilatation. Stable low-attenuation cystic appearing lesion in the left upper quadrant could be projecting off the cardial region of the  stomach or possibly off the upper aspect of the adrenal gland. It measures 5 Hounsfield units and is likely a benign cyst. No upper abdominal adenopathy. Musculoskeletal: No breast masses, supraclavicular or axillary adenopathy. No significant bony findings. IMPRESSION: 1. Single 4 mm right lower lobe pulmonary nodule, likely benign but indeterminate. Attention on future scans is suggested. 2. No mediastinal or hilar mass or adenopathy. 3. Liver lesions are not well demonstrated on this study. 4. Stable low-attenuation cystic appearing lesion in the left upper quadrant, likely a benign cyst. Electronically Signed   By: Marijo Sanes M.D.   On: 10/14/2020 16:16   MR ABDOMEN W WO CONTRAST  Result Date: 10/11/2020 CLINICAL DATA:  55 year old female with history of abdominal pain. Indeterminate liver lesions noted on recent CT examination. Evaluate for potential metastatic disease. EXAM: MRI ABDOMEN WITHOUT  AND WITH CONTRAST TECHNIQUE: Multiplanar multisequence MR imaging of the abdomen was performed both before and after the administration of intravenous contrast. CONTRAST:  28mL GADAVIST GADOBUTROL 1 MMOL/ML IV SOLN COMPARISON:  No prior abdominal MRI. CT the abdomen and pelvis 10/10/2020. FINDINGS: Lower chest: Unremarkable. Hepatobiliary: There are multiple lesions scattered throughout the liver which are mildly T1 hypointense, slightly T2 hyperintense and demonstrate a hypovascular appearance on post gadolinium images concerning for metastatic lesions. Specific examples include a 2.1 x 1.4 cm lesion in the periphery of the liver between segments 4A and 8 (axial image 31 of series 22), a 1.8 x 1.6 cm lesion in the caudate lobe (axial image 45 of series 2), and a 1.5 x 1.3 cm lesion in segment 7 (axial image 44 of series 22). There is also a small 7 mm lesion between segments 7 and 8 (axial image 43 of series 22). No intra or extrahepatic biliary ductal dilatation. Status post cholecystectomy. Pancreas: No  pancreatic mass. No pancreatic ductal dilatation. No pancreatic or peripancreatic fluid collections or inflammatory changes. Spleen:  Unremarkable. Adrenals/Urinary Tract: 1.3 x 0.7 cm T1 hypointense, T2 hyperintense, nonenhancing lesion in the upper pole of the right kidney, compatible with a simple cyst. No aggressive appearing renal lesions. No hydroureteronephrosis in the visualized portions of the abdomen. Bilateral adrenal glands are normal in appearance. Stomach/Bowel: Several dilated loops of small bowel measuring up to 4.1 cm in diameter with multiple air-fluid levels. Terminal ileum is partially imaged on several pulse sequences, and appears thickened with increased mural enhancement. Vascular/Lymphatic: No aneurysm identified in the visualized abdominal vasculature. Several prominent but nonenlarged ileocolic lymph nodes are noted. No other definite enlarged lymph nodes are noted elsewhere in the visualized portions of the abdomen. Other: No significant volume of ascites noted in the visualized portions of the peritoneal cavity. Musculoskeletal: No aggressive appearing osseous lesions are noted in the visualized portions of the skeleton. IMPRESSION: 1. Multiple liver lesions concerning for metastatic disease, as detailed above. 2. Partially imaged terminal ileal thickening with increased mural enhancement and numerous prominent but nonenlarged adjacent ileocolic lymph nodes. This could represent inflammatory bowel disease such as Crohn's ileitis, however, the possibility of a malignant lesion warrants consideration, particularly in light of the liver lesions. This appears associated with small bowel obstruction, similar to recent CT examination. Electronically Signed   By: Vinnie Langton M.D.   On: 10/11/2020 12:03   CT Abdomen Pelvis W Contrast  Result Date: 10/10/2020 CLINICAL DATA:  Suspected bowel obstruction. EXAM: CT ABDOMEN AND PELVIS WITH CONTRAST TECHNIQUE: Multidetector CT imaging of the  abdomen and pelvis was performed using the standard protocol following bolus administration of intravenous contrast. CONTRAST:  178mL OMNIPAQUE IOHEXOL 300 MG/ML  SOLN COMPARISON:  None. FINDINGS: Lower chest: No acute abnormality. Hepatobiliary: Several ill-defined hypoattenuated masses throughout the liver. Status post cholecystectomy. No biliary dilatation. Pancreas: Unremarkable. No pancreatic ductal dilatation or surrounding inflammatory changes. Spleen: Normal in size without focal abnormality. Adrenals/Urinary Tract: Adrenal glands are unremarkable. Kidneys are without renal calculi, focal lesion, or hydronephrosis. Few mm probable right renal cyst. Bladder is unremarkable. Stomach/Bowel: 1.5 cm gastric diverticulum off of the posterior gastric body. There is diffuse distal small bowel dilation to the level of the terminal ileum. The appendix is normal. The colon is decompressed. Nonspecific thickening of the terminal ileum. Scattered colonic diverticulosis. Vascular/Lymphatic: No significant vascular findings are present. No enlarged abdominal or pelvic lymph nodes. Multiple shotty nonspecific central mesenteric lymph nodes. Reproductive: No acute findings. Other: No  abdominal wall hernia or abnormality. No abdominopelvic ascites. Musculoskeletal: Bilateral L5-S1 pars articularis defects. No alignment abnormalities. Spondylosis of the lower lumbosacral spine. IMPRESSION: 1. Several ill-defined hypoattenuated masses throughout the liver, indeterminate. Metastatic disease is in the differential diagnosis. Further evaluation with contrast-enhanced abdominal MRI may be considered. 2. Small bowel obstruction to the level of the terminal ileum. Thickening of the terminal ileum, soft tissue mass cannot be excluded. 3. Scattered colonic diverticulosis without evidence of acute diverticulitis. 4. Bilateral L5-S1 pars articularis defects. No alignment abnormalities. Electronically Signed   By: Fidela Salisbury M.D.    On: 10/10/2020 16:53   IR ABDOMEN US LIMITED  Result Date: 10/13/2020 CLINICAL DATA:  Concern for metastatic colon cancer, now with multiple liver lesions worrisome for metastatic disease. Please perform ultrasound-guided liver lesion biopsy for tissue diagnostic purposes. EXAM: ULTRASOUND ABDOMEN LIMITED COMPARISON:  Abdominal MRI-10/11/2020; CT abdomen pelvis-10/10/2020 FINDINGS: Exhaustive sonographic evaluation performed by the dictating interventional radiologist failed to confidently replicate any of the worrisome liver lesions demonstrated on preceding cross-sectional imaging. As such, biopsy was deferred at this time. IMPRESSION: Non visualization of worrisome liver lesions potentially at least partially attributable to patient body habitus and hepatic steatosis. No biopsy attempted. PLAN: - Recommend waiting for results of endoscopic biopsy performed earlier today. - Ultimately, further evaluation could be performed with PET-CT imaging as indicated. - Note, if tissue diagnosis is still required, would recommend attempting liver lesion biopsy with CT guidance as indicated, though this too likely will be challenging secondary to patient body habitus and central location of both dominant liver lesions. Above discussed with Dr. Cristina Gong (GI) at the time of procedure completion. Electronically Signed   By: Sandi Mariscal M.D.   On: 10/13/2020 16:17    Labs:  CBC: Recent Labs    10/16/20 0514 10/17/20 0424 10/18/20 0420 11/01/20 1121  WBC 11.4* 6.7 5.4 4.9  HGB 13.0 11.5* 11.2* 12.8  HCT 39.9 36.3 35.9* 40.0  PLT 298 263 268 365    COAGS: Recent Labs    10/13/20 0312  INR 1.0    BMP: Recent Labs    10/15/20 0409 10/16/20 0514 10/17/20 0424 10/18/20 0420 11/01/20 1121  NA 139 133*  --  136 137  K 4.0 3.5 3.3* 3.9 3.9  CL 105 100  --  103 101  CO2 24 27  --  25 29  GLUCOSE 109* 122*  --  103* 115*  BUN <5* <5*  --  6 14  CALCIUM 8.9 8.2*  --  8.5* 9.3  CREATININE 0.81 0.71  0.79 0.85 0.75  GFRNONAA >60 >60 >60 >60 >60    LIVER FUNCTION TESTS: Recent Labs    10/10/20 1511 10/11/20 0350 10/12/20 0344 11/01/20 1121  BILITOT 0.8 0.9 0.5 0.5  AST 61* 44* 39 20  ALT 66* 58* 51* 27  ALKPHOS 72 63 64 81  PROT 7.4 6.4* 6.4* 6.6  ALBUMIN 3.5 3.1* 3.0* 3.8    TUMOR MARKERS: No results for input(s): AFPTM, CEA, CA199, CHROMGRNA in the last 8760 hours.  Assessment and Plan: Colon cancer For port placement Risks and benefits of image guided port-a-catheter placement was discussed with the patient including, but not limited to bleeding, infection, pneumothorax, or fibrin sheath development and need for additional procedures.  All of the patient's questions were answered, patient is agreeable to proceed. Consent signed and in chart.   Thank you for this interesting consult.  I greatly enjoyed meeting Lauren Mcpherson and look forward to participating in  their care.  A copy of this report was sent to the requesting provider on this date.  Electronically Signed: Ascencion Dike, PA-C 11/08/2020, 12:59 PM   I spent a total of 20 minutes in face to face in clinical consultation, greater than 50% of which was counseling/coordinating care for port

## 2020-11-08 NOTE — Progress Notes (Signed)
Notified patient's husband that procedure is running behind schedule for PAC placement. He verbalizes understanding.

## 2020-11-08 NOTE — Discharge Instructions (Signed)
Interventional radiology phone numbers °[phone] °After hours [phone] ° ° ° °You have skin glue (dermabond) over your new port. Do not use the lidocaine cream (EMLA cream) over the skin glue until it has healed. The petroleum in the lidocaine cream will dissolve the skin glue resulting in an infection of your new port. Use ice in a zip lock bag for 1-2 minutes over your new port before the cancer center nurses access your port. ° ° °Implanted Port Insertion, Care After °This sheet gives you information about how to care for yourself after your procedure. Your health care provider may also give you more specific instructions. If you have problems or questions, contact your health care provider. °What can I expect after the procedure? °After the procedure, it is common to have: °Discomfort at the port insertion site. °Bruising on the skin over the port. This should improve over 3-4 days. °Follow these instructions at home: °Port care °After your port is placed, you will get a manufacturer's information card. The card has information about your port. Keep this card with you at all times. °Take care of the port as told by your health care provider. Ask your health care provider if you or a family member can get training for taking care of the port at home. A home health care nurse may also take care of the port. °Make sure to remember what type of port you have. °Incision care °Follow instructions from your health care provider about how to take care of your port insertion site. Make sure you: °Wash your hands with soap and water before and after you change your bandage (dressing). If soap and water are not available, use hand sanitizer. °Change your dressing as told by your health care provider. °Leave skin glue in place. These skin closures may need to stay in place for 2 weeks or longer.  °Check your port insertion site every day for signs of infection. Check for: °Redness, swelling, or pain. °Fluid or  blood. °Warmth. °Pus or a bad smell.  °  °  °Activity °Return to your normal activities as told by your health care provider. Ask your health care provider what activities are safe for you. °Do not lift anything that is heavier than 10 lb (4.5 kg), or the limit that you are told, until your health care provider says that it is safe. °General instructions °Take over-the-counter and prescription medicines only as told by your health care provider. °Do not take baths, swim, or use a hot tub until your health care provider approves.You may remove your dressing tomorrow and shower 24 hours after your procedure. °Do not drive for 24 hours if you were given a sedative during your procedure. °Wear a medical alert bracelet in case of an emergency. This will tell any health care providers that you have a port. °Keep all follow-up visits as told by your health care provider. This is important. °Contact a health care provider if: °You cannot flush your port with saline as directed, or you cannot draw blood from the port. °You have a fever or chills. °You have redness, swelling, or pain around your port insertion site. °You have fluid or blood coming from your port insertion site. °Your port insertion site feels warm to the touch. °You have pus or a bad smell coming from the port insertion site. °Get help right away if: °You have chest pain or shortness of breath. °You have bleeding from your port that you cannot control. °Summary °Take care of   the port as told by your health care provider. Keep the manufacturer's information card with you at all times. °Change your dressing as told by your health care provider. °Contact a health care provider if you have a fever or chills or if you have redness, swelling, or pain around your port insertion site. °Keep all follow-up visits as told by your health care provider. °This information is not intended to replace advice given to you by your health care provider. Make sure you discuss any  questions you have with your health care provider. °Document Revised: 11/06/2017 Document Reviewed: 11/06/2017 °Elsevier Patient Education © 2021 [employer] ° ° ° °Moderate Conscious Sedation, Adult, Care After °This sheet gives you information about how to care for yourself after your procedure. Your health care provider may also give you more specific instructions. If you have problems or questions, contact your health care provider. °What can I expect after the procedure? °After the procedure, it is common to have: °Sleepiness for several hours. °Impaired judgment for several hours. °Difficulty with balance. °Vomiting if you eat too soon. °Follow these instructions at home: °For the time period you were told by your health care provider: °Rest. °Do not participate in activities where you could fall or become injured. °Do not drive or use machinery. °Do not drink alcohol. °Do not take sleeping pills or medicines that cause drowsiness. °Do not make important decisions or sign legal documents. °Do not take care of children on your own.  °  °  °Eating and drinking °Follow the diet recommended by your health care provider. °Drink enough fluid to keep your urine pale yellow. °If you vomit: °Drink water, juice, or soup when you can drink without vomiting. °Make sure you have little or no nausea before eating solid foods.   °General instructions °Take over-the-counter and prescription medicines only as told by your health care provider. °Have a responsible adult stay with you for the time you are told. It is important to have someone help care for you until you are awake and alert. °Do not smoke. °Keep all follow-up visits as told by your health care provider. This is important. °Contact a health care provider if: °You are still sleepy or having trouble with balance after 24 hours. °You feel light-headed. °You keep feeling nauseous or you keep vomiting. °You develop a rash. °You have a fever. °You have redness or  swelling around the IV site. °Get help right away if: °You have trouble breathing. °You have new-onset confusion at home. °Summary °After the procedure, it is common to feel sleepy, have impaired judgment, or feel nauseous if you eat too soon. °Rest after you get home. Know the things you should not do after the procedure. °Follow the diet recommended by your health care provider and drink enough fluid to keep your urine pale yellow. °Get help right away if you have trouble breathing or new-onset confusion at home. °This information is not intended to replace advice given to you by your health care provider. Make sure you discuss any questions you have with your health care provider. °Document Revised: 08/08/2019 Document Reviewed: 03/06/2019 °Elsevier Patient Education © 2021 [employer]  °

## 2020-11-09 ENCOUNTER — Inpatient Hospital Stay: Payer: 59

## 2020-11-09 ENCOUNTER — Encounter: Payer: Self-pay | Admitting: *Deleted

## 2020-11-09 NOTE — Progress Notes (Signed)
Patient in chemotherapy education class with  husband Calvin.  Discussed side effects of  5FU, Oxaliplatin, Leucovorin, Irinotecan  which include but are not limited to myelosuppression, decreased appetite, fatigue, fever, allergic or infusional reaction, mucositis, cardiac toxicity, cough, SOB, altered taste, nausea and vomiting, diarrhea, constipation, elevated LFTs myalgia and arthralgias, hair loss or thinning, rash, skin dryness, nail changes, peripheral neuropathy, discolored urine, delayed wound healing, mental changes (Chemo brain), increased risk of infections, weight loss.  Reviewed infusion room and office policy and procedure and phone numbers 24 hours x 7 days a week.  Reviewed ambulatory pump specifics and how to manage safe handling at home.  Reviewed when to call the office with any concerns or problems.  Scientist, clinical (histocompatibility and immunogenetics) given.  Discussed portacath insertion and EMLA cream administration.  Antiemetic protocol and chemotherapy schedule reviewed. Patient verbalized understanding of chemotherapy indications and possible side effects.  Teachback done

## 2020-11-10 ENCOUNTER — Other Ambulatory Visit: Payer: Self-pay

## 2020-11-10 ENCOUNTER — Encounter (HOSPITAL_COMMUNITY): Payer: Self-pay

## 2020-11-10 ENCOUNTER — Ambulatory Visit: Payer: 59

## 2020-11-10 ENCOUNTER — Inpatient Hospital Stay: Payer: 59

## 2020-11-10 ENCOUNTER — Other Ambulatory Visit: Payer: 59

## 2020-11-10 DIAGNOSIS — C180 Malignant neoplasm of cecum: Secondary | ICD-10-CM

## 2020-11-10 DIAGNOSIS — Z5111 Encounter for antineoplastic chemotherapy: Secondary | ICD-10-CM | POA: Diagnosis not present

## 2020-11-10 LAB — CBC WITH DIFFERENTIAL (CANCER CENTER ONLY)
Abs Immature Granulocytes: 0.01 10*3/uL (ref 0.00–0.07)
Basophils Absolute: 0 10*3/uL (ref 0.0–0.1)
Basophils Relative: 1 %
Eosinophils Absolute: 0.3 10*3/uL (ref 0.0–0.5)
Eosinophils Relative: 6 %
HCT: 35.4 % — ABNORMAL LOW (ref 36.0–46.0)
Hemoglobin: 11.7 g/dL — ABNORMAL LOW (ref 12.0–15.0)
Immature Granulocytes: 0 %
Lymphocytes Relative: 35 %
Lymphs Abs: 1.8 10*3/uL (ref 0.7–4.0)
MCH: 30.2 pg (ref 26.0–34.0)
MCHC: 33.1 g/dL (ref 30.0–36.0)
MCV: 91.5 fL (ref 80.0–100.0)
Monocytes Absolute: 0.4 10*3/uL (ref 0.1–1.0)
Monocytes Relative: 8 %
Neutro Abs: 2.7 10*3/uL (ref 1.7–7.7)
Neutrophils Relative %: 50 %
Platelet Count: 265 10*3/uL (ref 150–400)
RBC: 3.87 MIL/uL (ref 3.87–5.11)
RDW: 14.9 % (ref 11.5–15.5)
WBC Count: 5.3 10*3/uL (ref 4.0–10.5)
nRBC: 0 % (ref 0.0–0.2)

## 2020-11-10 LAB — CMP (CANCER CENTER ONLY)
ALT: 26 U/L (ref 0–44)
AST: 20 U/L (ref 15–41)
Albumin: 3.6 g/dL (ref 3.5–5.0)
Alkaline Phosphatase: 63 U/L (ref 38–126)
Anion gap: 6 (ref 5–15)
BUN: 14 mg/dL (ref 6–20)
CO2: 29 mmol/L (ref 22–32)
Calcium: 9.1 mg/dL (ref 8.9–10.3)
Chloride: 105 mmol/L (ref 98–111)
Creatinine: 0.72 mg/dL (ref 0.44–1.00)
GFR, Estimated: >60 mL/min (ref >60)
Glucose, Bld: 114 mg/dL — ABNORMAL HIGH (ref 70–99)
Potassium: 3.3 mmol/L — ABNORMAL LOW (ref 3.5–5.1)
Sodium: 140 mmol/L (ref 135–145)
Total Bilirubin: 0.6 mg/dL (ref 0.3–1.2)
Total Protein: 6.4 g/dL — ABNORMAL LOW (ref 6.5–8.1)

## 2020-11-10 LAB — TOTAL PROTEIN, URINE DIPSTICK: Protein, ur: NEGATIVE mg/dL

## 2020-11-10 MED ORDER — DEXTROSE 5 % IV SOLN
Freq: Once | INTRAVENOUS | Status: AC
  Filled 2020-11-10: qty 250

## 2020-11-10 MED ORDER — SODIUM CHLORIDE 0.9% FLUSH
10.0000 mL | INTRAVENOUS | Status: DC | PRN
  Filled 2020-11-10: qty 10

## 2020-11-10 MED ORDER — ATROPINE SULFATE 1 MG/ML IJ SOLN
INTRAMUSCULAR | Status: AC
  Filled 2020-11-10: qty 1

## 2020-11-10 MED ORDER — SODIUM CHLORIDE 0.9 % IV SOLN
150.0000 mg | Freq: Once | INTRAVENOUS | Status: AC
  Administered 2020-11-10: 150 mg via INTRAVENOUS
  Filled 2020-11-10: qty 150

## 2020-11-10 MED ORDER — OXALIPLATIN CHEMO INJECTION 100 MG/20ML
84.0000 mg/m2 | Freq: Once | INTRAVENOUS | Status: AC
  Administered 2020-11-10: 200 mg via INTRAVENOUS
  Filled 2020-11-10: qty 40

## 2020-11-10 MED ORDER — PALONOSETRON HCL INJECTION 0.25 MG/5ML
0.2500 mg | Freq: Once | INTRAVENOUS | Status: AC
  Administered 2020-11-10: 0.25 mg via INTRAVENOUS

## 2020-11-10 MED ORDER — SODIUM CHLORIDE 0.9 % IV SOLN
10.0000 mg | Freq: Once | INTRAVENOUS | Status: AC
  Administered 2020-11-10: 10 mg via INTRAVENOUS
  Filled 2020-11-10: qty 10

## 2020-11-10 MED ORDER — SODIUM CHLORIDE 0.9 % IV SOLN
2400.0000 mg/m2 | INTRAVENOUS | Status: DC
  Administered 2020-11-10: 5750 mg via INTRAVENOUS
  Filled 2020-11-10: qty 115

## 2020-11-10 MED ORDER — PALONOSETRON HCL INJECTION 0.25 MG/5ML
INTRAVENOUS | Status: AC
  Filled 2020-11-10: qty 5

## 2020-11-10 MED ORDER — DEXTROSE 5 % IV SOLN
400.0000 mg/m2 | Freq: Once | INTRAVENOUS | Status: AC
  Administered 2020-11-10: 956 mg via INTRAVENOUS
  Filled 2020-11-10: qty 47.8

## 2020-11-10 MED ORDER — HEPARIN SOD (PORK) LOCK FLUSH 100 UNIT/ML IV SOLN
500.0000 [IU] | Freq: Once | INTRAVENOUS | Status: DC | PRN
  Filled 2020-11-10: qty 5

## 2020-11-10 MED ORDER — SODIUM CHLORIDE 0.9 % IV SOLN
165.0000 mg/m2 | Freq: Once | INTRAVENOUS | Status: AC
  Administered 2020-11-10: 400 mg via INTRAVENOUS
  Filled 2020-11-10: qty 15

## 2020-11-10 MED ORDER — ATROPINE SULFATE 1 MG/ML IJ SOLN
0.5000 mg | Freq: Once | INTRAMUSCULAR | Status: AC | PRN
  Administered 2020-11-10: 0.5 mg via INTRAVENOUS

## 2020-11-10 NOTE — Patient Instructions (Signed)
Lauren Mcpherson  Discharge Instructions: Thank you for choosing [facility] to provide your oncology and hematology care.   If you have a lab appointment with the [facility], please go directly to the [facility] and check in at the registration area.  Wear comfortable clothing and clothing appropriate for easy access to any Portacath or PICC line.   We strive to give you quality time with your provider. You may need to reschedule your appointment if you arrive late (15 or more minutes).  Arriving late affects you and other patients whose appointments are after yours.  Also, if you miss three or more appointments without notifying the office, you may be dismissed from the clinic at the provider's discretion.      For prescription refill requests, have your pharmacy contact our office and allow 72 hours for refills to be completed.    Today you received the following chemotherapy and/or immunotherapy agents Irinotecan, Oxaliplatin, Leucovorin and Fluorouracil      To help prevent nausea and vomiting after your treatment, we encourage you to take your nausea medication as directed.  BELOW ARE SYMPTOMS THAT SHOULD BE REPORTED IMMEDIATELY: *FEVER GREATER THAN 100.4 F (38 C) OR HIGHER *CHILLS OR SWEATING *NAUSEA AND VOMITING THAT IS NOT CONTROLLED WITH YOUR NAUSEA MEDICATION *UNUSUAL SHORTNESS OF BREATH *UNUSUAL BRUISING OR BLEEDING *URINARY PROBLEMS (pain or burning when urinating, or frequent urination) *BOWEL PROBLEMS (unusual diarrhea, constipation, pain near the anus) TENDERNESS IN MOUTH AND THROAT WITH OR WITHOUT PRESENCE OF ULCERS (sore throat, sores in mouth, or a toothache) UNUSUAL RASH, SWELLING OR PAIN  UNUSUAL VAGINAL DISCHARGE OR ITCHING   Items with * indicate a potential emergency and should be followed up as soon as possible or go to the Emergency Department if any problems should occur.  Please show the CHEMOTHERAPY ALERT CARD or  IMMUNOTHERAPY ALERT CARD at check-in to the Emergency Department and triage nurse. Should you have questions after your visit or need to cancel or reschedule your appointment, please contact [facility]  [phone] and follow the prompts.  Office hours are 8:00 a.m. to 4:30 p.m. Monday - Friday. Please note that voicemails left after 4:00 p.m. may not be returned until the following business day.  We are closed weekends and major holidays. You have access to a nurse at all times for urgent questions. Please call the main number to the clinic [phone] and follow the prompts.  For any non-urgent questions, you may also contact your provider using MyChart. We now offer e-Visits for anyone 68 and older to request care online for non-urgent symptoms. For details visit [email or web address].   Also download the MyChart app! Go to the app store, search "MyChart", open the app, select [facility], and log in with your MyChart username and password.  Due to Covid, a mask is required upon entering the hospital/clinic. If you do not have a mask, one will be given to you upon arrival. For doctor visits, patients may have 1 support person aged 40 or older with them. For treatment visits, patients cannot have anyone with them due to current Covid guidelines and our immunocompromised population.

## 2020-11-10 NOTE — Progress Notes (Signed)
ANC pending, okay to use ANC from 7/11 per Dr. Marin Olp as today is patient's first treatment.

## 2020-11-10 NOTE — Patient Instructions (Addendum)
Implanted Port Home Guide An implanted port is a device that is placed under the skin. It is usually placed in the chest. The device can be used to give IV medicine, to take blood, or for dialysis. You may have an implanted port if: You need IV medicine that would be irritating to the small veins in your hands or arms. You need IV medicines, such as antibiotics, for a long period of time. You need IV nutrition for a long period of time. You need dialysis. When you have a port, your health care provider can choose to use the port instead of veins in your arms for these procedures. You may have fewer limitations when using a port than you would if you used other types of long-term IVs, and you will likely be able to return to normal activities afteryour incision heals. An implanted port has two main parts: Reservoir. The reservoir is the part where a needle is inserted to give medicines or draw blood. The reservoir is round. After it is placed, it appears as a small, raised area under your skin. Catheter. The catheter is a thin, flexible tube that connects the reservoir to a vein. Medicine that is inserted into the reservoir goes into the catheter and then into the vein. How is my port accessed? To access your port: A numbing cream may be placed on the skin over the port site. Your health care provider will put on a mask and sterile gloves. The skin over your port will be cleaned carefully with a germ-killing soap and allowed to dry. Your health care provider will gently pinch the port and insert a needle into it. Your health care provider will check for a blood return to make sure the port is in the vein and is not clogged. If your port needs to remain accessed to get medicine continuously (constant infusion), your health care provider will place a clear bandage (dressing) over the needle site. The dressing and needle will need to be changed every week, or as told by your health care provider. What  is flushing? Flushing helps keep the port from getting clogged. Follow instructions from your health care provider about how and when to flush the port. Ports are usually flushed with saline solution or a medicine called heparin. The need for flushing will depend on how the port is used: If the port is only used from time to time to give medicines or draw blood, the port may need to be flushed: Before and after medicines have been given. Before and after blood has been drawn. As part of routine maintenance. Flushing may be recommended every 4-6 weeks. If a constant infusion is running, the port may not need to be flushed. Throw away any syringes in a disposal container that is meant for sharp items (sharps container). You can buy a sharps container from a pharmacy, or you can make one by using an empty hard plastic bottle with a cover. How long will my port stay implanted? The port can stay in for as long as your health care provider thinks it is needed. When it is time for the port to come out, a surgery will be done to remove it. The surgery will be similar to the procedure that was done to putthe port in. Follow these instructions at home:  Flush your port as told by your health care provider. If you need an infusion over several days, follow instructions from your health care provider about how to take   care of your port site. Make sure you: Wash your hands with soap and water before you change your dressing. If soap and water are not available, use alcohol-based hand sanitizer. Change your dressing as told by your health care provider. Place any used dressings or infusion bags into a plastic bag. Throw that bag in the trash. Keep the dressing that covers the needle clean and dry. Do not get it wet. Do not use scissors or sharp objects near the tube. Keep the tube clamped, unless it is being used. Check your port site every day for signs of infection. Check for: Redness, swelling, or  pain. Fluid or blood. Pus or a bad smell. Protect the skin around the port site. Avoid wearing bra straps that rub or irritate the site. Protect the skin around your port from seat belts. Place a soft pad over your chest if needed. Bathe or shower as told by your health care provider. The site may get wet as long as you are not actively receiving an infusion. Return to your normal activities as told by your health care provider. Ask your health care provider what activities are safe for you. Carry a medical alert card or wear a medical alert bracelet at all times. This will let health care providers know that you have an implanted port in case of an emergency. Get help right away if: You have redness, swelling, or pain at the port site. You have fluid or blood coming from your port site. You have pus or a bad smell coming from the port site. You have a fever. Summary Implanted ports are usually placed in the chest for long-term IV access. Follow instructions from your health care provider about flushing the port and changing bandages (dressings). Take care of the area around your port by avoiding clothing that puts pressure on the area, and by watching for signs of infection. Protect the skin around your port from seat belts. Place a soft pad over your chest if needed. Get help right away if you have a fever or you have redness, swelling, pain, drainage, or a bad smell at the port site. This information is not intended to replace advice given to you by your health care provider. Make sure you discuss any questions you have with your healthcare provider. Document Revised: 08/25/2019 Document Reviewed: 08/25/2019 Elsevier Patient Education  2022 Elsevier Inc.  

## 2020-11-12 ENCOUNTER — Inpatient Hospital Stay: Payer: 59

## 2020-11-12 ENCOUNTER — Other Ambulatory Visit: Payer: Self-pay

## 2020-11-12 VITALS — BP 119/82 | HR 59 | Temp 98.0°F | Resp 17

## 2020-11-12 DIAGNOSIS — C180 Malignant neoplasm of cecum: Secondary | ICD-10-CM

## 2020-11-12 DIAGNOSIS — Z5111 Encounter for antineoplastic chemotherapy: Secondary | ICD-10-CM | POA: Diagnosis not present

## 2020-11-12 MED ORDER — HEPARIN SOD (PORK) LOCK FLUSH 100 UNIT/ML IV SOLN
500.0000 [IU] | Freq: Once | INTRAVENOUS | Status: AC | PRN
  Administered 2020-11-12: 500 [IU]
  Filled 2020-11-12: qty 5

## 2020-11-12 MED ORDER — SODIUM CHLORIDE 0.9% FLUSH
10.0000 mL | INTRAVENOUS | Status: DC | PRN
  Administered 2020-11-12: 10 mL
  Filled 2020-11-12: qty 10

## 2020-11-16 ENCOUNTER — Encounter: Payer: Self-pay | Admitting: *Deleted

## 2020-11-16 NOTE — Progress Notes (Signed)
Navigator out of office for patient's first treatment.   Oncology Nurse Navigator Documentation  Oncology Nurse Navigator Flowsheets 11/16/2020  Abnormal Finding Date -  Confirmed Diagnosis Date -  Diagnosis Status -  Phase of Treatment Chemo  Chemotherapy Pending- Reason: -  Chemotherapy Actual Start Date: 11/10/2020  Chemotherapy Expected End Date: 01/19/2021  Surgery Actual Start Date: -  Navigator Follow Up Date: 11/24/2020  Navigator Follow Up Reason: Follow-up Appointment;Chemotherapy  Navigator Location [facility]  Referral Date to RadOnc/MedOnc -  Navigator Encounter Type Appt/Treatment Plan Review  Telephone -  Treatment Initiated Date -  Patient Visit Type MedOnc  Treatment Phase Active Tx  Barriers/Navigation Needs Coordination of Care;Education  Education -  Interventions None Required  Acuity Level 2-Minimal Needs (1-2 Barriers Identified)  Coordination of Care -  Education Method -  Support Groups/Services Friends and Family  Time Spent with Patient 15

## 2020-11-24 ENCOUNTER — Other Ambulatory Visit: Payer: Self-pay | Admitting: Hematology & Oncology

## 2020-11-24 ENCOUNTER — Ambulatory Visit (HOSPITAL_COMMUNITY)
Admission: RE | Admit: 2020-11-24 | Discharge: 2020-11-24 | Disposition: A | Discharge status: Home / Self Care | Payer: 59 | Source: Ambulatory Visit | Attending: Hematology & Oncology | Admitting: Hematology & Oncology

## 2020-11-24 ENCOUNTER — Other Ambulatory Visit: Payer: Self-pay

## 2020-11-24 ENCOUNTER — Telehealth: Payer: Self-pay

## 2020-11-24 ENCOUNTER — Inpatient Hospital Stay: Payer: 59 | Attending: Hematology & Oncology

## 2020-11-24 ENCOUNTER — Inpatient Hospital Stay (HOSPITAL_BASED_OUTPATIENT_CLINIC_OR_DEPARTMENT_OTHER): Payer: 59 | Admitting: Hematology & Oncology

## 2020-11-24 ENCOUNTER — Inpatient Hospital Stay: Payer: 59

## 2020-11-24 ENCOUNTER — Encounter: Payer: Self-pay | Admitting: Hematology & Oncology

## 2020-11-24 VITALS — BP 102/61 | HR 91 | Temp 98.6°F | Resp 20 | Wt 278.0 lb

## 2020-11-24 DIAGNOSIS — C180 Malignant neoplasm of cecum: Secondary | ICD-10-CM | POA: Diagnosis not present

## 2020-11-24 DIAGNOSIS — Z5111 Encounter for antineoplastic chemotherapy: Secondary | ICD-10-CM | POA: Insufficient documentation

## 2020-11-24 DIAGNOSIS — C787 Secondary malignant neoplasm of liver and intrahepatic bile duct: Secondary | ICD-10-CM | POA: Insufficient documentation

## 2020-11-24 DIAGNOSIS — Z452 Encounter for adjustment and management of vascular access device: Secondary | ICD-10-CM | POA: Insufficient documentation

## 2020-11-24 HISTORY — PX: IR CV LINE INJECTION: IMG2294

## 2020-11-24 LAB — CBC WITH DIFFERENTIAL (CANCER CENTER ONLY)
Abs Immature Granulocytes: 0 10*3/uL (ref 0.00–0.07)
Basophils Absolute: 0 10*3/uL (ref 0.0–0.1)
Basophils Relative: 1 %
Eosinophils Absolute: 0.1 10*3/uL (ref 0.0–0.5)
Eosinophils Relative: 2 %
HCT: 34 % — ABNORMAL LOW (ref 36.0–46.0)
Hemoglobin: 11.1 g/dL — ABNORMAL LOW (ref 12.0–15.0)
Immature Granulocytes: 0 %
Lymphocytes Relative: 44 %
Lymphs Abs: 1.6 10*3/uL (ref 0.7–4.0)
MCH: 29.7 pg (ref 26.0–34.0)
MCHC: 32.6 g/dL (ref 30.0–36.0)
MCV: 90.9 fL (ref 80.0–100.0)
Monocytes Absolute: 0.5 10*3/uL (ref 0.1–1.0)
Monocytes Relative: 14 %
Neutro Abs: 1.4 10*3/uL — ABNORMAL LOW (ref 1.7–7.7)
Neutrophils Relative %: 39 %
Platelet Count: 317 10*3/uL (ref 150–400)
RBC: 3.74 MIL/uL — ABNORMAL LOW (ref 3.87–5.11)
RDW: 14 % (ref 11.5–15.5)
WBC Count: 3.5 10*3/uL — ABNORMAL LOW (ref 4.0–10.5)
nRBC: 0 % (ref 0.0–0.2)

## 2020-11-24 LAB — CMP (CANCER CENTER ONLY)
ALT: 31 U/L (ref 0–44)
AST: 16 U/L (ref 15–41)
Albumin: 3.5 g/dL (ref 3.5–5.0)
Alkaline Phosphatase: 64 U/L (ref 38–126)
Anion gap: 8 (ref 5–15)
BUN: 9 mg/dL (ref 6–20)
CO2: 32 mmol/L (ref 22–32)
Calcium: 9.1 mg/dL (ref 8.9–10.3)
Chloride: 102 mmol/L (ref 98–111)
Creatinine: 0.74 mg/dL (ref 0.44–1.00)
GFR, Estimated: >60 mL/min (ref >60)
Glucose, Bld: 122 mg/dL — ABNORMAL HIGH (ref 70–99)
Potassium: 3.1 mmol/L — ABNORMAL LOW (ref 3.5–5.1)
Sodium: 142 mmol/L (ref 135–145)
Total Bilirubin: 0.6 mg/dL (ref 0.3–1.2)
Total Protein: 6.2 g/dL — ABNORMAL LOW (ref 6.5–8.1)

## 2020-11-24 LAB — TOTAL PROTEIN, URINE DIPSTICK: Protein, ur: <30 mg/dL — AB

## 2020-11-24 LAB — LACTATE DEHYDROGENASE: LDH: 141 U/L (ref 98–192)

## 2020-11-24 MED ORDER — HEPARIN SOD (PORK) LOCK FLUSH 100 UNIT/ML IV SOLN
INTRAVENOUS | Status: AC
  Filled 2020-11-24: qty 5

## 2020-11-24 MED ORDER — LIDOCAINE HCL 1 % IJ SOLN
INTRAMUSCULAR | Status: AC
  Filled 2020-11-24: qty 20

## 2020-11-24 MED ORDER — ALTEPLASE 2 MG IJ SOLR
2.0000 mg | Freq: Once | INTRAMUSCULAR | Status: AC
  Administered 2020-11-24: 2 mg
  Filled 2020-11-24: qty 2

## 2020-11-24 MED ORDER — ALTEPLASE 2 MG IJ SOLR
INTRAMUSCULAR | Status: AC
  Filled 2020-11-24: qty 2

## 2020-11-24 NOTE — Telephone Encounter (Signed)
Appts made per 11/24/20 los and pt to gain sch in chemo and at [employer]

## 2020-11-24 NOTE — Progress Notes (Signed)
11/24/2020 Per Dr. Marin Olp, patient to start Dexamethasone the day after chemo (infusion day) not pump DC day. Also OK to take 6 mg BID x 3 days instead of 4 mg BID x 3 days.

## 2020-11-24 NOTE — Procedures (Signed)
Right double-lumen basilic vein PICC placed.  Length 44 cm; tip SVC/ right atrial junction; okay to use; medication used- 1% lidocaine to skin and subcutaneous tissue; EBL < 5 cc.

## 2020-11-24 NOTE — Progress Notes (Signed)
Hematology and Oncology Follow Up Visit  Lauren Mcpherson [id number] Sep 01, 1965 55 y.o. 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  She tolerated her first cycle of chemotherapy well.  Unfortunate, Port-A-Cath is not working right now.  We are trying her best to try to get it to work and do drawback blood.  We will not be able to do this.  We may have to send her back to radiology to have a study done.  Of note, her last CEA was down to 10.  She has had no problems with bowels or bladder.  She has had no issues with bleeding.  There is been no nausea or vomiting.  She has had no mouth sores.  She has had no leg swelling.  She has had no problems with fever.  There is been no leg swelling.  She has had no fever.  There is been no cough or shortness of breath.  Overall, performance status is ECOG 1.    Medications:  Current Outpatient Medications:    loperamide (IMODIUM A-D) 2 MG tablet, Take 2 at onset of diarrhea, then 1 every 2hrs until 12hr without a BM. May take 2 tab every 4hrs at bedtime. If diarrhea recurs repeat., Disp: 100 tablet, Rfl: 1   ondansetron (ZOFRAN) 8 MG tablet, Take 1 tablet (8 mg total) by mouth 2 (two) times daily as needed. Start on day 3 after chemotherapy., Disp: 30 tablet, Rfl: 1   prochlorperazine (COMPAZINE) 10 MG tablet, Take 1 tablet (10 mg total) by mouth every 6 (six) hours as needed (Nausea or vomiting)., Disp: 30 tablet, Rfl: 1   dexamethasone (DECADRON) 4 MG tablet, Take 2 tablets (8 mg total) by mouth daily. Start the day after chemotherapy for 3 days. Take with food., Disp: 8 tablet, Rfl: 5   lidocaine-prilocaine (EMLA) cream, Apply to affected area once (Patient not taking: Reported on 11/24/2020), Disp: 30 g, Rfl: 3   oxyCODONE (OXY IR/ROXICODONE) 5 MG immediate release  tablet, Take 1 tablet (5 mg total) by mouth every 6 (six) hours as needed for breakthrough pain. (Patient not taking: No sig reported), Disp: 15 tablet, Rfl: 0  Allergies: No Known Allergies  Past Medical History, Surgical history, Social history, and Family History were reviewed and updated.  Review of Systems: Review of Systems  Constitutional: Negative.   HENT:  Negative.    Eyes: Negative.   Respiratory: Negative.    Cardiovascular: Negative.   Gastrointestinal: Negative.   Endocrine: Negative.   Genitourinary: Negative.    Musculoskeletal: Negative.   Skin: Negative.   Neurological: Negative.   Hematological: Negative.   Psychiatric/Behavioral: Negative.     Physical Exam:  weight is 278 lb 0.6 oz (126.1 kg). Her oral temperature is 98.6 F (37 C). Her blood pressure is 102/61 and her pulse is 91. Her respiration is 20 and oxygen saturation is 100%.   Wt Readings from Last 3 Encounters:  11/24/20 278 lb 0.6 oz (126.1 kg)  11/01/20 284 lb 1.9 oz (128.9 kg)  10/17/20 291 lb 4.8 oz (132.1 kg)    Physical Exam Vitals reviewed.  HENT:     Head: Normocephalic and atraumatic.  Eyes:     Pupils: Pupils are equal, round, and reactive to light.  Cardiovascular:     Rate and Rhythm:  Normal rate and regular rhythm.     Heart sounds: Normal heart sounds.  Pulmonary:     Effort: Pulmonary effort is normal.     Breath sounds: Normal breath sounds.  Abdominal:     General: Bowel sounds are normal.     Palpations: Abdomen is soft.     Comments: Her abdominal exam is obese.  She has a healed laparoscopy scar in the midline.  There is no fluid wave.  There is no guarding or rebound tenderness.  I feel no palpable liver or spleen tip.  Musculoskeletal:        General: No tenderness or deformity. Normal range of motion.     Cervical back: Normal range of motion.  Lymphadenopathy:     Cervical: No cervical adenopathy.  Skin:    General: Skin is warm and dry.     Findings: No  erythema or rash.  Neurological:     Mental Status: She is alert and oriented to person, place, and time.  Psychiatric:        Behavior: Behavior normal.        Thought Content: Thought content normal.        Judgment: Judgment normal.     Lab Results  Component Value Date   WBC 3.5 (L) 11/24/2020   HGB 11.1 (L) 11/24/2020   HCT 34.0 (L) 11/24/2020   MCV 90.9 11/24/2020   PLT 317 11/24/2020     Chemistry      Component Value Date/Time   NA 142 11/24/2020 0834   K 3.1 (L) 11/24/2020 0834   CL 102 11/24/2020 0834   CO2 32 11/24/2020 0834   BUN 9 11/24/2020 0834   CREATININE 0.74 11/24/2020 0834      Component Value Date/Time   CALCIUM 9.1 11/24/2020 0834   ALKPHOS 64 11/24/2020 0834   AST 16 11/24/2020 0834   ALT 31 11/24/2020 0834   BILITOT 0.6 11/24/2020 0834      Impression and Plan: Lauren Mcpherson is a very nice 55 year old African-American female.  She has metastatic adenocarcinoma of the cecum.  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  I do think that she will respond to the FOLFOXIRI/Avastin protocol.  We does have to get her Port-A-Cath to work.  We will see what her CEA level is.  Again she is very healthy.  She is a tough lady.  She is a strong voice for the community.  Hopefully, we will be able to treat her today.  I will plan to get her back in 2 more weeks for her third cycle of treatment.  After 4 cycles, we will then repeat her scans.   [name], MD 8/3/202210:04 AM

## 2020-11-24 NOTE — Progress Notes (Signed)
Lauren Mcpherson camp rn received call from Bruno Foti in IR, per dr Kathlene Cote tip of catheter had migrated into the subq tissue and patient is not a candidate for another port, d/t potential of migrating again. He suggests a PICC line. Per Dr. Marin Olp, okay to place PICC line.

## 2020-11-24 NOTE — Progress Notes (Signed)
Unable to get blood return from port-a-cath. Alteplase instilled at 8:40 am. Lauren Mcpherson is still accessed. IR  at [facility] will work patient in today to check port placement. Order is in.

## 2020-11-24 NOTE — Patient Instructions (Signed)
Implanted Port Home Guide An implanted port is a device that is placed under the skin. It is usually placed in the chest. The device can be used to give IV medicine, to take blood, or for dialysis. You may have an implanted port if: You need IV medicine that would be irritating to the small veins in your hands or arms. You need IV medicines, such as antibiotics, for a long period of time. You need IV nutrition for a long period of time. You need dialysis. When you have a port, your health care provider can choose to use the port instead of veins in your arms for these procedures. You may have fewer limitations when using a port than you would if you used other types of long-term IVs, and you will likely be able to return to normal activities afteryour incision heals. An implanted port has two main parts: Reservoir. The reservoir is the part where a needle is inserted to give medicines or draw blood. The reservoir is round. After it is placed, it appears as a small, raised area under your skin. Catheter. The catheter is a thin, flexible tube that connects the reservoir to a vein. Medicine that is inserted into the reservoir goes into the catheter and then into the vein. How is my port accessed? To access your port: A numbing cream may be placed on the skin over the port site. Your health care provider will put on a mask and sterile gloves. The skin over your port will be cleaned carefully with a germ-killing soap and allowed to dry. Your health care provider will gently pinch the port and insert a needle into it. Your health care provider will check for a blood return to make sure the port is in the vein and is not clogged. If your port needs to remain accessed to get medicine continuously (constant infusion), your health care provider will place a clear bandage (dressing) over the needle site. The dressing and needle will need to be changed every week, or as told by your health care provider. What  is flushing? Flushing helps keep the port from getting clogged. Follow instructions from your health care provider about how and when to flush the port. Ports are usually flushed with saline solution or a medicine called heparin. The need for flushing will depend on how the port is used: If the port is only used from time to time to give medicines or draw blood, the port may need to be flushed: Before and after medicines have been given. Before and after blood has been drawn. As part of routine maintenance. Flushing may be recommended every 4-6 weeks. If a constant infusion is running, the port may not need to be flushed. Throw away any syringes in a disposal container that is meant for sharp items (sharps container). You can buy a sharps container from a pharmacy, or you can make one by using an empty hard plastic bottle with a cover. How long will my port stay implanted? The port can stay in for as long as your health care provider thinks it is needed. When it is time for the port to come out, a surgery will be done to remove it. The surgery will be similar to the procedure that was done to putthe port in. Follow these instructions at home:  Flush your port as told by your health care provider. If you need an infusion over several days, follow instructions from your health care provider about how to take   care of your port site. Make sure you: Wash your hands with soap and water before you change your dressing. If soap and water are not available, use alcohol-based hand sanitizer. Change your dressing as told by your health care provider. Place any used dressings or infusion bags into a plastic bag. Throw that bag in the trash. Keep the dressing that covers the needle clean and dry. Do not get it wet. Do not use scissors or sharp objects near the tube. Keep the tube clamped, unless it is being used. Check your port site every day for signs of infection. Check for: Redness, swelling, or  pain. Fluid or blood. Pus or a bad smell. Protect the skin around the port site. Avoid wearing bra straps that rub or irritate the site. Protect the skin around your port from seat belts. Place a soft pad over your chest if needed. Bathe or shower as told by your health care provider. The site may get wet as long as you are not actively receiving an infusion. Return to your normal activities as told by your health care provider. Ask your health care provider what activities are safe for you. Carry a medical alert card or wear a medical alert bracelet at all times. This will let health care providers know that you have an implanted port in case of an emergency. Get help right away if: You have redness, swelling, or pain at the port site. You have fluid or blood coming from your port site. You have pus or a bad smell coming from the port site. You have a fever. Summary Implanted ports are usually placed in the chest for long-term IV access. Follow instructions from your health care provider about flushing the port and changing bandages (dressings). Take care of the area around your port by avoiding clothing that puts pressure on the area, and by watching for signs of infection. Protect the skin around your port from seat belts. Place a soft pad over your chest if needed. Get help right away if you have a fever or you have redness, swelling, pain, drainage, or a bad smell at the port site. This information is not intended to replace advice given to you by your health care provider. Make sure you discuss any questions you have with your healthcare provider. Document Revised: 08/25/2019 Document Reviewed: 08/25/2019 Elsevier Patient Education  2022 Elsevier Inc.  

## 2020-11-25 ENCOUNTER — Other Ambulatory Visit (HOSPITAL_COMMUNITY): Payer: Self-pay | Admitting: Hematology & Oncology

## 2020-11-25 DIAGNOSIS — C180 Malignant neoplasm of cecum: Secondary | ICD-10-CM

## 2020-11-25 LAB — CEA (IN HOUSE-CHCC): CEA (CHCC-In House): 7.22 ng/mL — ABNORMAL HIGH (ref 0.00–5.00)

## 2020-11-26 ENCOUNTER — Inpatient Hospital Stay: Payer: 59

## 2020-11-30 ENCOUNTER — Encounter: Payer: Self-pay | Admitting: *Deleted

## 2020-11-30 ENCOUNTER — Other Ambulatory Visit: Payer: Self-pay | Admitting: Radiology

## 2020-11-30 ENCOUNTER — Other Ambulatory Visit: Payer: Self-pay | Admitting: Internal Medicine

## 2020-11-30 NOTE — Progress Notes (Signed)
Patient came last week for her 2nd cycle of treatment but her port was non-functional. IR was able to complete assessment and found it was malpositioned. She now has a PICC line placed, and her port will be removed this week. Will continue second cycle next week.  Oncology Nurse Navigator Documentation  Oncology Nurse Navigator Flowsheets 11/30/2020  Abnormal Finding Date -  Confirmed Diagnosis Date -  Diagnosis Status -  Phase of Treatment -  Chemotherapy Pending- Reason: -  Chemotherapy Actual Start Date: -  Chemotherapy Expected End Date: -  Surgery Actual Start Date: -  Navigator Follow Up Date: 12/07/2020  Navigator Follow Up Reason: Follow-up Appointment;Chemotherapy  Navigator Location [facility]  Referral Date to RadOnc/MedOnc -  Navigator Encounter Type Appt/Treatment Plan Review  Telephone -  Treatment Initiated Date -  Patient Visit Type MedOnc  Treatment Phase Active Tx  Barriers/Navigation Needs Coordination of Care;Education  Education -  Interventions None Required  Acuity Level 2-Minimal Needs (1-2 Barriers Identified)  Coordination of Care -  Education Method -  Support Groups/Services Friends and Family  Time Spent with Patient 15

## 2020-12-01 ENCOUNTER — Ambulatory Visit (HOSPITAL_COMMUNITY)
Admission: RE | Admit: 2020-12-01 | Discharge: 2020-12-01 | Disposition: A | Discharge status: Home / Self Care | Payer: 59 | Source: Ambulatory Visit | Attending: Hematology & Oncology | Admitting: Hematology & Oncology

## 2020-12-01 ENCOUNTER — Encounter (HOSPITAL_COMMUNITY): Payer: Self-pay

## 2020-12-01 ENCOUNTER — Other Ambulatory Visit (HOSPITAL_COMMUNITY): Payer: Self-pay | Admitting: Hematology & Oncology

## 2020-12-01 ENCOUNTER — Other Ambulatory Visit: Payer: Self-pay

## 2020-12-01 DIAGNOSIS — Z452 Encounter for adjustment and management of vascular access device: Secondary | ICD-10-CM | POA: Insufficient documentation

## 2020-12-01 DIAGNOSIS — Z87891 Personal history of nicotine dependence: Secondary | ICD-10-CM | POA: Insufficient documentation

## 2020-12-01 DIAGNOSIS — C787 Secondary malignant neoplasm of liver and intrahepatic bile duct: Secondary | ICD-10-CM | POA: Diagnosis not present

## 2020-12-01 DIAGNOSIS — Z9221 Personal history of antineoplastic chemotherapy: Secondary | ICD-10-CM | POA: Insufficient documentation

## 2020-12-01 DIAGNOSIS — C180 Malignant neoplasm of cecum: Secondary | ICD-10-CM

## 2020-12-01 HISTORY — PX: IR REMOVAL TUN ACCESS W/ PORT W/O FL MOD SED: IMG2290

## 2020-12-01 MED ORDER — MIDAZOLAM HCL 2 MG/2ML IJ SOLN
INTRAMUSCULAR | Status: AC | PRN
  Administered 2020-12-01 (×2): 2 mg via INTRAVENOUS

## 2020-12-01 MED ORDER — LIDOCAINE HCL 1 % IJ SOLN
INTRAMUSCULAR | Status: AC
  Filled 2020-12-01: qty 20

## 2020-12-01 MED ORDER — HEPARIN SOD (PORK) LOCK FLUSH 100 UNIT/ML IV SOLN
250.0000 [IU] | INTRAVENOUS | Status: AC | PRN
  Administered 2020-12-01: 250 [IU]

## 2020-12-01 MED ORDER — SODIUM CHLORIDE 0.9 % IV SOLN: INTRAVENOUS | Status: DC

## 2020-12-01 MED ORDER — LIDOCAINE HCL (PF) 1 % IJ SOLN
INTRAMUSCULAR | Status: AC | PRN
  Administered 2020-12-01: 10 mL via INTRADERMAL

## 2020-12-01 MED ORDER — HEPARIN SOD (PORK) LOCK FLUSH 100 UNIT/ML IV SOLN
INTRAVENOUS | Status: AC
  Filled 2020-12-01: qty 5

## 2020-12-01 MED ORDER — FENTANYL CITRATE (PF) 100 MCG/2ML IJ SOLN
INTRAMUSCULAR | Status: AC | PRN
  Administered 2020-12-01 (×2): 50 ug via INTRAVENOUS

## 2020-12-01 MED ORDER — MIDAZOLAM HCL 2 MG/2ML IJ SOLN
INTRAMUSCULAR | Status: AC
  Filled 2020-12-01: qty 4

## 2020-12-01 MED ORDER — FENTANYL CITRATE (PF) 100 MCG/2ML IJ SOLN
INTRAMUSCULAR | Status: AC
  Filled 2020-12-01: qty 2

## 2020-12-01 NOTE — Consult Note (Signed)
Chief Complaint: Port-a-cath removal    Supervising Physician: Corrie Mckusick  Patient Status: [facility] - Out-pt  History of Present Illness: Lauren Mcpherson is a 55 y.o. 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 Marin Olp. Port-a-cath that was placed 11/08/20 has become malpositioned per radiological studies and non-functional. Pt had PICC placed 11/24/20 to continue tx. Pt is here today for removal of port-a-cath.   Past Medical History:  Diagnosis Date   Cancer [facility])    Goals of care, counseling/discussion 11/01/2020    Past Surgical History:  Procedure Laterality Date   BIOPSY  10/13/2020   Procedure: BIOPSY;  Surgeon: Ronald Lobo, MD;  Location: [facility] ENDOSCOPY;  Service: Endoscopy;;   CHOLECYSTECTOMY     COLONOSCOPY WITH PROPOFOL N/A 10/13/2020   Procedure: COLONOSCOPY WITH PROPOFOL;  Surgeon: Ronald Lobo, MD;  Location: [facility] ENDOSCOPY;  Service: Endoscopy;  Laterality: N/A;   IR CV LINE INJECTION  11/24/2020   IR IMAGING GUIDED PORT INSERTION  11/08/2020   LAPAROSCOPIC RIGHT COLECTOMY Right 10/15/2020   Procedure: LAPAROSCOPIC RIGHT COLECTOMY, LAPAROSCOPIC LIVER BIOPSY, LAPAROSCOPIC TAP BLOCK;  Surgeon: Greer Pickerel, MD;  Location: [facility] ORS;  Service: General;  Laterality: Right;  90 MINUTES   POLYPECTOMY  10/13/2020   Procedure: POLYPECTOMY;  Surgeon: Ronald Lobo, MD;  Location: [facility] ENDOSCOPY;  Service: Endoscopy;;   TONSILLECTOMY      Allergies: Patient has no known allergies.  Medications: Prior to Admission medications   Medication Sig Start Date End Date Taking? Authorizing Provider  dexamethasone (DECADRON) 4 MG tablet Take 2 tablets (8 mg total) by mouth daily. Start the day after chemotherapy for 3 days. Take with food. 11/02/20   [name], MD  lidocaine-prilocaine (EMLA) cream Apply to affected area once Patient not taking: Reported on 11/24/2020 11/02/20   [name], MD   loperamide (IMODIUM A-D) 2 MG tablet Take 2 at onset of diarrhea, then 1 every 2hrs until 12hr without a BM. May take 2 tab every 4hrs at bedtime. If diarrhea recurs repeat. 11/02/20   [name], MD  ondansetron (ZOFRAN) 8 MG tablet Take 1 tablet (8 mg total) by mouth 2 (two) times daily as needed. Start on day 3 after chemotherapy. 11/02/20   [name], MD  oxyCODONE (OXY IR/ROXICODONE) 5 MG immediate release tablet Take 1 tablet (5 mg total) by mouth every 6 (six) hours as needed for breakthrough pain. Patient not taking: No sig reported 10/18/20   Maczis, Barth Kirks, PA-C  prochlorperazine (COMPAZINE) 10 MG tablet Take 1 tablet (10 mg total) by mouth every 6 (six) hours as needed (Nausea or vomiting). 11/02/20   [name], MD     No family history on file.  Social History   Socioeconomic History   Marital status: Married    Spouse name: Not on file   Number of children: Not on file   Years of education: Not on file   Highest education level: Not on file  Occupational History   Not on file  Tobacco Use   Smoking status: Former    Packs/day: 0.50    Years: 20.00    Pack years: 10.00    Types: Cigarettes    Quit date: 2009    Years since quitting: 13.6   Smokeless tobacco: Never  Vaping Use   Vaping Use: Never used  Substance and Sexual Activity   Alcohol use:  Yes    Comment: occ   Drug use: No   Sexual activity: Yes    Birth control/protection: None  Other Topics Concern   Not on file  Social History Narrative   Not on file   Social Determinants of Health   Financial Resource Strain: Not on file  Food Insecurity: Not on file  Transportation Needs: Not on file  Physical Activity: Not on file  Stress: Not on file  Social Connections: Not on file     Review of Systems  Constitutional:  Negative for chills and fever.  Cardiovascular:  Negative for chest pain and leg swelling.  Gastrointestinal:  Negative for abdominal pain, blood in stool, nausea  and vomiting.   Vital Signs: BP 119/84   Pulse 72   Temp 98.1 F (36.7 C) (Oral)   Resp (!) 28   SpO2 99%   Physical Exam Constitutional:      Appearance: She is obese.  HENT:     Mouth/Throat:     Mouth: Mucous membranes are moist.     Pharynx: Oropharynx is clear.  Cardiovascular:     Rate and Rhythm: Normal rate and regular rhythm.     Heart sounds: Normal heart sounds.  Pulmonary:     Effort: Pulmonary effort is normal.     Breath sounds: No wheezing.  Abdominal:     Palpations: Abdomen is soft.  Skin:    General: Skin is warm and dry.  Neurological:     Mental Status: She is alert and oriented to person, place, and time.  Psychiatric:        Mood and Affect: Mood normal.        Behavior: Behavior normal.        Thought Content: Thought content normal.        Judgment: Judgment normal.    Imaging: IR CV Line Injection  Result Date: 11/24/2020 CLINICAL DATA:  History of cecal carcinoma and status post placement Port-A-Cath on 11/08/2020. There has been inability to aspirate blood from the port. EXAM: FLUOROSCOPY OF PORT A CATH CONTRAST:  None FLUOROSCOPY TIME:  12 seconds.  10.0 mGy. PROCEDURE: The port was left accessed from the [place]. Fluoroscopy was performed of the port. Contrast injection was not performed based on fluoroscopic findings. FINDINGS: Fluoroscopy demonstrates malpositioning of the port since placement. The port reservoir has rotated and migrated into a nearly supraclavicular position and the attached tubing has completely dislodged from the vein and is coiled in the port pocket as well as the adjacent subcutaneous tunnel and base of the neck. Even if the tubing tip was barely in the jugular vein, this port cannot be used for medication administration safely and therefore contrast injection was not performed. Early dislodgement of the port catheter in the subcutaneous tissues is a poor indicator for future durability of port placement and is secondary  to the patient's body habitus and large breasts. It is not felt that port revision would be in the best interest of the patient and a PICC line placement is currently recommended for chemotherapy needs. The patient will be set up for elective removal of the malpositioned port under moderate conscious sedation. IMPRESSION: 1. Malpositioned port since placement with rotated port reservoir and complete dislodgement of the attached catheter out of the vein and into the subcutaneous tissues. This has occurred just over 2 weeks after placement and is a poor indicated for future durability of port placement. 2. Recommend placement of PICC line for central venous  access and chemotherapy needs. 3. The patient will be set up for elective removal of the malpositioned port and attached catheter under conscious sedation. Electronically Signed   By: Aletta Edouard M.D.   On: 11/24/2020 15:09   IR IMAGING GUIDED PORT INSERTION  Result Date: 11/08/2020 CLINICAL DATA:  Metastatic cecal carcinoma and need for porta cath for chemotherapy. EXAM: IMPLANTED PORT A CATH PLACEMENT WITH ULTRASOUND AND FLUOROSCOPIC GUIDANCE ANESTHESIA/SEDATION: 4.0 mg IV Versed; 100 mcg IV Fentanyl Total Moderate Sedation Time:  38 minutes The patient's level of consciousness and physiologic status were continuously monitored during the procedure by Radiology nursing. FLUOROSCOPY TIME:  24 seconds.  10.0 mGy. PROCEDURE: The procedure, risks, benefits, and alternatives were explained to the patient. Questions regarding the procedure were encouraged and answered. The patient understands and consents to the procedure. A time-out was performed prior to initiating the procedure. Ultrasound was utilized to confirm patency of the right internal jugular vein. The right neck and chest were prepped with chlorhexidine in a sterile fashion, and a sterile drape was applied covering the operative field. Maximum barrier sterile technique with sterile gowns and gloves  were used for the procedure. Local anesthesia was provided with 1% lidocaine. After creating a small venotomy incision, a 21 gauge needle was advanced into the right internal jugular vein under direct, real-time ultrasound guidance. Ultrasound image documentation was performed. After securing guidewire access, an 8 Fr dilator was placed. A J-wire was kinked to measure appropriate catheter length. A subcutaneous port pocket was then created along the upper chest wall utilizing sharp and blunt dissection. Portable cautery was utilized. The pocket was irrigated with sterile saline. A single lumen power injectable port was chosen for placement. The 8 Fr catheter was tunneled from the port pocket site to the venotomy incision. The port was placed in the pocket. External catheter was trimmed to appropriate length based on guidewire measurement. At the venotomy, an 8 Fr peel-away sheath was placed over a guidewire. The catheter was then placed through the sheath and the sheath removed. Final catheter positioning was confirmed and documented with a fluoroscopic spot image. The port was accessed with a needle and aspirated and flushed with heparinized saline. The access needle was removed. The venotomy and port pocket incisions were closed with subcutaneous 3-0 Monocryl and subcuticular 4-0 Vicryl. Dermabond was applied to both incisions. COMPLICATIONS: COMPLICATIONS None FINDINGS: After catheter placement, the tip lies at the cavo-atrial junction. Due to body habitus, there was some difficulty in creating a stable subcutaneous port pocket in the right upper chest wall. Attempt at tacking the port base in subcutaneous fat was unsuccessful as it resulted in increased tilting of the port compared to not using any tacking sutures. The port is palpable and access should be able to be facilitated by pinching the port reservoir between fingers in order to stabilize the port in the pocket. The catheter aspirates normally and is  ready for immediate use. IMPRESSION: Placement of single lumen port a cath via right internal jugular vein. The catheter tip lies at the cavo-atrial junction. A power injectable port a cath was placed and is ready for immediate use. As above, the port reservoir is within subcutaneous fat due to body habitus and was difficult to secure as attempting to place tacking sutures actually resulted in increased tilting of the port. Tacking sutures were therefore not placed. Electronically Signed   By: Aletta Edouard M.D.   On: 11/08/2020 16:56   IR PICC PLACEMENT RIGHT <5 YRS  INC IMG GUIDE  Result Date: 11/24/2020 INDICATION: Patient with history of metastatic cecal adenocarcinoma, malpositioned and poorly functioning Port-A-Cath ; central venous access is requested to assist with additional treatment. EXAM: RIGHT UPPER EXTREMITY PICC LINE PLACEMENT WITH ULTRASOUND AND FLUOROSCOPIC GUIDANCE MEDICATIONS: 1% lidocaine to skin and subcutaneous tissue ANESTHESIA/SEDATION: None FLUOROSCOPY TIME:  Fluoroscopy Time: 2 minutes 30 seconds (25 mGy). COMPLICATIONS: None immediate. PROCEDURE: The patient was advised of the possible risks and complications and agreed to undergo the procedure. The patient was then brought to the angiographic suite for the procedure. The right arm was prepped with chlorhexidine, draped in the usual sterile fashion using maximum barrier technique (cap and mask, sterile gown, sterile gloves, large sterile sheet, hand hygiene and cutaneous antisepsis) and infiltrated locally with 1% Lidocaine. Ultrasound demonstrated patency of the right basilic vein, and this was documented with an image. Under real-time ultrasound guidance, this vein was accessed with a 21 gauge micropuncture needle and image documentation was performed. A 0.018 wire was introduced in to the vein. Over this, a 5 [place] double lumen power injectable PICC was advanced to the lower SVC/right atrial junction. Fluoroscopy during the  procedure and fluoro spot radiograph confirms appropriate catheter position. The catheter was flushed and covered with a sterile dressing. Catheter length: 44 cm IMPRESSION: Successful right arm power PICC line placement with ultrasound and fluoroscopic guidance. The catheter is ready for use. Read by: Rowe Robert, PA-C Electronically Signed   By: Aletta Edouard M.D.   On: 11/24/2020 15:50    Labs:  CBC: Recent Labs    10/18/20 0420 11/01/20 1121 11/10/20 0830 11/24/20 0834  WBC 5.4 4.9 5.3 3.5*  HGB 11.2* 12.8 11.7* 11.1*  HCT 35.9* 40.0 35.4* 34.0*  PLT 268 365 265 317    COAGS: Recent Labs    10/13/20 0312  INR 1.0    BMP: Recent Labs    10/18/20 0420 11/01/20 1121 11/10/20 0830 11/24/20 0834  NA 136 137 140 142  K 3.9 3.9 3.3* 3.1*  CL 103 101 105 102  CO2 '25 29 29 '$ 32  GLUCOSE 103* 115* 114* 122*  BUN '6 14 14 9  '$ CALCIUM 8.5* 9.3 9.1 9.1  CREATININE 0.85 0.75 0.72 0.74  GFRNONAA >60 >60 >60 >60    LIVER FUNCTION TESTS: Recent Labs    10/12/20 0344 11/01/20 1121 11/10/20 0830 11/24/20 0834  BILITOT 0.5 0.5 0.6 0.6  AST 39 '20 20 16  '$ ALT 51* '27 26 31  '$ ALKPHOS 64 81 63 64  PROT 6.4* 6.6 6.4* 6.2*  ALBUMIN 3.0* 3.8 3.6 3.5    TUMOR MARKERS: No results for input(s): AFPTM, CEA, CA199, CHROMGRNA in the last 8760 hours.  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 Marin Olp. Port-a-cath that was placed 11/08/20 has become non-functional and malpositioned per radiological studies. Pt had PICC placed 11/24/20 to continue tx. Pt is here today for removal of port-a-cath.  Risks and benefits of port-a-catheter removal was discussed with the patient including, but not limited to bleeding, infection, pneumothorax, or fibrin sheath development and need for additional procedures.  All of the patient's questions were answered, patient is agreeable to proceed. Consent signed and  in chart.    Thank you for this interesting consult.  I greatly enjoyed meeting BRIEANNA BIRSCHBACH and look forward to participating in their care.  A copy of this report was sent to the requesting provider on  this date.  Electronically Signed: Tyson Alias, NP 12/01/2020, 12:39 PM   I spent a total of 15 minutes in face to face in clinical consultation, greater than 50% of which was counseling/coordinating care for port-a-cath removal.

## 2020-12-01 NOTE — Discharge Instructions (Signed)
Urgent needs - Interventional Radiology on call MD [phone]  Wound - May remove dressing and shower in 24 to 48 hours.  Keep site clean and dry.  Replace with bandaid as needed.  Do not submerge in tub or water until site healing well. If closed with glue, glue will flake off on its own.

## 2020-12-01 NOTE — Procedures (Addendum)
Interventional Radiology Procedure Note  Procedure: Removal of right IJ approach single lumen PowerPort.    Complications: None Recommendations:  - Ok to shower tomorrow - Do not submerge for 7 days - Routine wound care  Signed,  Dulcy Fanny. Earleen Newport, DO

## 2020-12-07 ENCOUNTER — Encounter: Payer: Self-pay | Admitting: Family

## 2020-12-07 ENCOUNTER — Other Ambulatory Visit: Payer: Self-pay | Admitting: Family

## 2020-12-07 ENCOUNTER — Inpatient Hospital Stay: Payer: 59

## 2020-12-07 ENCOUNTER — Encounter: Payer: Self-pay | Admitting: *Deleted

## 2020-12-07 ENCOUNTER — Inpatient Hospital Stay (HOSPITAL_BASED_OUTPATIENT_CLINIC_OR_DEPARTMENT_OTHER): Payer: 59 | Admitting: Family

## 2020-12-07 ENCOUNTER — Other Ambulatory Visit: Payer: Self-pay

## 2020-12-07 ENCOUNTER — Telehealth: Payer: Self-pay | Admitting: *Deleted

## 2020-12-07 VITALS — BP 120/82 | HR 70 | Temp 98.1°F | Resp 18 | Wt 283.4 lb

## 2020-12-07 DIAGNOSIS — C787 Secondary malignant neoplasm of liver and intrahepatic bile duct: Secondary | ICD-10-CM | POA: Diagnosis not present

## 2020-12-07 DIAGNOSIS — C180 Malignant neoplasm of cecum: Secondary | ICD-10-CM | POA: Diagnosis not present

## 2020-12-07 DIAGNOSIS — D509 Iron deficiency anemia, unspecified: Secondary | ICD-10-CM | POA: Diagnosis not present

## 2020-12-07 DIAGNOSIS — Z452 Encounter for adjustment and management of vascular access device: Secondary | ICD-10-CM | POA: Diagnosis not present

## 2020-12-07 DIAGNOSIS — Z5111 Encounter for antineoplastic chemotherapy: Secondary | ICD-10-CM | POA: Diagnosis not present

## 2020-12-07 DIAGNOSIS — Z95828 Presence of other vascular implants and grafts: Secondary | ICD-10-CM

## 2020-12-07 LAB — CBC WITH DIFFERENTIAL (CANCER CENTER ONLY)
Abs Immature Granulocytes: 0.02 10*3/uL (ref 0.00–0.07)
Basophils Absolute: 0 10*3/uL (ref 0.0–0.1)
Basophils Relative: 1 %
Eosinophils Absolute: 0.2 10*3/uL (ref 0.0–0.5)
Eosinophils Relative: 2 %
HCT: 34.8 % — ABNORMAL LOW (ref 36.0–46.0)
Hemoglobin: 11.2 g/dL — ABNORMAL LOW (ref 12.0–15.0)
Immature Granulocytes: 0 %
Lymphocytes Relative: 33 %
Lymphs Abs: 2.5 10*3/uL (ref 0.7–4.0)
MCH: 29.2 pg (ref 26.0–34.0)
MCHC: 32.2 g/dL (ref 30.0–36.0)
MCV: 90.6 fL (ref 80.0–100.0)
Monocytes Absolute: 0.6 10*3/uL (ref 0.1–1.0)
Monocytes Relative: 8 %
Neutro Abs: 4.3 10*3/uL (ref 1.7–7.7)
Neutrophils Relative %: 56 %
Platelet Count: 515 10*3/uL — ABNORMAL HIGH (ref 150–400)
RBC: 3.84 MIL/uL — ABNORMAL LOW (ref 3.87–5.11)
RDW: 15.1 % (ref 11.5–15.5)
WBC Count: 7.6 10*3/uL (ref 4.0–10.5)
nRBC: 0 % (ref 0.0–0.2)

## 2020-12-07 LAB — CMP (CANCER CENTER ONLY)
ALT: 23 U/L (ref 0–44)
AST: 15 U/L (ref 15–41)
Albumin: 3.3 g/dL — ABNORMAL LOW (ref 3.5–5.0)
Alkaline Phosphatase: 62 U/L (ref 38–126)
Anion gap: 8 (ref 5–15)
BUN: 12 mg/dL (ref 6–20)
CO2: 29 mmol/L (ref 22–32)
Calcium: 9.1 mg/dL (ref 8.9–10.3)
Chloride: 105 mmol/L (ref 98–111)
Creatinine: 0.73 mg/dL (ref 0.44–1.00)
GFR, Estimated: >60 mL/min (ref >60)
Glucose, Bld: 112 mg/dL — ABNORMAL HIGH (ref 70–99)
Potassium: 3.7 mmol/L (ref 3.5–5.1)
Sodium: 142 mmol/L (ref 135–145)
Total Bilirubin: 0.3 mg/dL (ref 0.3–1.2)
Total Protein: 5.7 g/dL — ABNORMAL LOW (ref 6.5–8.1)

## 2020-12-07 LAB — CEA (IN HOUSE-CHCC): CEA (CHCC-In House): 6.44 ng/mL — ABNORMAL HIGH (ref 0.00–5.00)

## 2020-12-07 LAB — LACTATE DEHYDROGENASE: LDH: 184 U/L (ref 98–192)

## 2020-12-07 LAB — TOTAL PROTEIN, URINE DIPSTICK: Protein, ur: NEGATIVE mg/dL

## 2020-12-07 MED ORDER — SODIUM CHLORIDE 0.9% FLUSH
10.0000 mL | Freq: Once | INTRAVENOUS | Status: AC
  Administered 2020-12-07: 3 mL via INTRAVENOUS

## 2020-12-07 MED ORDER — HEPARIN SOD (PORK) LOCK FLUSH 100 UNIT/ML IV SOLN
250.0000 [IU] | Freq: Once | INTRAVENOUS | Status: AC
  Administered 2020-12-07: 250 [IU] via INTRAVENOUS

## 2020-12-07 MED ORDER — SODIUM CHLORIDE 0.9 % IV SOLN
10.0000 mg | Freq: Once | INTRAVENOUS | Status: AC
  Administered 2020-12-07: 10 mg via INTRAVENOUS
  Filled 2020-12-07: qty 10

## 2020-12-07 MED ORDER — SODIUM CHLORIDE 0.9 % IV SOLN
2400.0000 mg/m2 | INTRAVENOUS | Status: DC
  Administered 2020-12-07: 5750 mg via INTRAVENOUS
  Filled 2020-12-07: qty 115

## 2020-12-07 MED ORDER — SODIUM CHLORIDE 0.9 % IV SOLN
150.0000 mg | Freq: Once | INTRAVENOUS | Status: AC
  Administered 2020-12-07: 150 mg via INTRAVENOUS
  Filled 2020-12-07: qty 150

## 2020-12-07 MED ORDER — DEXTROSE 5 % IV SOLN: Freq: Once | INTRAVENOUS | Status: AC

## 2020-12-07 MED ORDER — SODIUM CHLORIDE 0.9 % IV SOLN
165.0000 mg/m2 | Freq: Once | INTRAVENOUS | Status: AC
  Administered 2020-12-07: 400 mg via INTRAVENOUS
  Filled 2020-12-07: qty 15

## 2020-12-07 MED ORDER — SODIUM CHLORIDE 0.9% FLUSH
3.0000 mL | Freq: Once | INTRAVENOUS | Status: AC
  Administered 2020-12-07: 3 mL via INTRAVENOUS

## 2020-12-07 MED ORDER — SODIUM CHLORIDE 0.9% FLUSH: 10.0000 mL | INTRAVENOUS | Status: DC | PRN

## 2020-12-07 MED ORDER — PALONOSETRON HCL INJECTION 0.25 MG/5ML
0.2500 mg | Freq: Once | INTRAVENOUS | Status: AC
  Administered 2020-12-07: 0.25 mg via INTRAVENOUS
  Filled 2020-12-07: qty 5

## 2020-12-07 MED ORDER — OXALIPLATIN CHEMO INJECTION 100 MG/20ML
84.0000 mg/m2 | Freq: Once | INTRAVENOUS | Status: AC
  Administered 2020-12-07: 200 mg via INTRAVENOUS
  Filled 2020-12-07: qty 40

## 2020-12-07 MED ORDER — HEPARIN SOD (PORK) LOCK FLUSH 100 UNIT/ML IV SOLN
500.0000 [IU] | Freq: Once | INTRAVENOUS | Status: DC | PRN
Start: 2020-12-07 — End: 2020-12-07

## 2020-12-07 MED ORDER — ATROPINE SULFATE 1 MG/ML IJ SOLN
0.5000 mg | Freq: Once | INTRAMUSCULAR | Status: DC | PRN
  Filled 2020-12-07: qty 1

## 2020-12-07 MED ORDER — LEUCOVORIN CALCIUM INJECTION 350 MG
400.0000 mg/m2 | Freq: Once | INTRAMUSCULAR | Status: AC
  Administered 2020-12-07: 956 mg via INTRAVENOUS
  Filled 2020-12-07: qty 47.8

## 2020-12-07 NOTE — Progress Notes (Signed)
1555 patient states she has just had 2 diarrhea stools. She took 2 imodium tablets that she brought from home. Instructed er to use 2 tablets after each loose stool, up to 8 tablets per day. Call this office tomorrow if not resolved.

## 2020-12-07 NOTE — Addendum Note (Signed)
Addended by: Burney Gauze R on: 12/07/2020 10:52 AM   Modules accepted: Orders

## 2020-12-07 NOTE — Patient Instructions (Signed)
[facility] [place]  Discharge Instructions: Thank you for choosing [facility] to provide your oncology and hematology care.   If you have a lab appointment with the [facility], please go directly to the [facility] and check in at the registration area.  Wear comfortable clothing and clothing appropriate for easy access to any Portacath or PICC line.   We strive to give you quality time with your provider. You may need to reschedule your appointment if you arrive late (15 or more minutes).  Arriving late affects you and other patients whose appointments are after yours.  Also, if you miss three or more appointments without notifying the office, you may be dismissed from the clinic at the provider's discretion.      For prescription refill requests, have your pharmacy contact our office and allow 72 hours for refills to be completed.    Today you received the following chemotherapy and/or immunotherapy agents 5-fu, oxaliplatin, leucovorin      To help prevent nausea and vomiting after your treatment, we encourage you to take your nausea medication as directed.  BELOW ARE SYMPTOMS THAT SHOULD BE REPORTED IMMEDIATELY: *FEVER GREATER THAN 100.4 F (38 C) OR HIGHER *CHILLS OR SWEATING *NAUSEA AND VOMITING THAT IS NOT CONTROLLED WITH YOUR NAUSEA MEDICATION *UNUSUAL SHORTNESS OF BREATH *UNUSUAL BRUISING OR BLEEDING *URINARY PROBLEMS (pain or burning when urinating, or frequent urination) *BOWEL PROBLEMS (unusual diarrhea, constipation, pain near the anus) TENDERNESS IN MOUTH AND THROAT WITH OR WITHOUT PRESENCE OF ULCERS (sore throat, sores in mouth, or a toothache) UNUSUAL RASH, SWELLING OR PAIN  UNUSUAL VAGINAL DISCHARGE OR ITCHING   Items with * indicate a potential emergency and should be followed up as soon as possible or go to the Emergency Department if any problems should occur.  Please show the CHEMOTHERAPY ALERT CARD or IMMUNOTHERAPY ALERT CARD  at check-in to the Emergency Department and triage nurse. Should you have questions after your visit or need to cancel or reschedule your appointment, please contact [facility]  [phone] and follow the prompts.  Office hours are 8:00 a.m. to 4:30 p.m. Monday - Friday. Please note that voicemails left after 4:00 p.m. may not be returned until the following business day.  We are closed weekends and major holidays. You have access to a nurse at all times for urgent questions. Please call the main number to the clinic [phone] and follow the prompts.  For any non-urgent questions, you may also contact your provider using MyChart. We now offer e-Visits for anyone 4 and older to request care online for non-urgent symptoms. For details visit [email or web address].   Also download the MyChart app! Go to the app store, search "MyChart", open the app, select [facility], and log in with your MyChart username and password.  Due to Covid, a mask is required upon entering the hospital/clinic. If you do not have a mask, one will be given to you upon arrival. For doctor visits, patients may have 1 support person aged 87 or older with them. For treatment visits, patients cannot have anyone with them due to current Covid guidelines and our immunocompromised population.

## 2020-12-07 NOTE — Patient Instructions (Signed)
PICC Home Care Guide A peripherally inserted central catheter (PICC) is a form of IV access that allows medicines and IV fluids to be quickly distributed throughout the body. The PICC is a long, thin, flexible tube (catheter) that is inserted into a vein in the upper arm. The catheter ends in a large vein in the chest (superior vena cava, or SVC). After the PICC is inserted, a chest X-ray may be done to make surethat it is in the correct place. A PICC may be placed for different reasons, such as: To give medicines and liquid nutrition. To give IV fluids and blood products. If there is trouble placing a peripheral intravenous (PIV) catheter. If taken care of properly, a PICC can remain in place for several months. Having a PICC can also allow a person to go home from the hospital sooner. Medicine and PICC care can be managed at home by a family member, caregiver, orhome health care team. What are the risks? Generally, having a PICC is safe. However, problems may occur, including: A blood clot (thrombus) forming in or at the tip of the PICC. A blood clot forming in a vein (deep vein thrombosis) or traveling to the lung (pulmonary embolism). Inflammation of the vein (phlebitis) in which the PICC is placed. Infection. Central line associated blood stream infection (CLABSI) is a serious infection that often requires hospitalization. PICC movement (malposition). The PICC tip may move from its original position due to excessive physical activity, forceful coughing, sneezing, or vomiting. A break or cut in the PICC. It is important not to use scissors near the PICC. Nerve or tendon irritation or injury during PICC insertion. How to take care of your PICC Preventing problems You and any caregivers should wash your hands often with soap. Wash hands: Before touching the PICC line or the infusion device. Before changing a bandage (dressing). Flush the PICC as told by your health care provider. Let your  health care provider know right away if the PICC is hard to flush or does not flush. Do not use force to flush the PICC. Do not use a syringe that is less than 10 mL to flush the PICC. Avoid blood pressure checks on the arm in which the PICC is placed. Never pull or tug on the PICC. Do not take the PICC out yourself. Only a trained clinical professional should remove the PICC. Use clean and sterile supplies only. Keep the supplies in a dry place. Do not reuse needles, syringes, or any other supplies. Doing that can lead to infection. Keep pets and children away from your PICC line. Check the PICC insertion site every day for signs of infection. Check for: Leakage. Redness, swelling, or pain. Fluid or blood. Warmth. Pus or a bad smell. PICC dressing care Keep your PICC bandage (dressing) clean and dry to prevent infection. Do not take baths, swim, or use a hot tub until your health care provider approves. Ask your health care provider if you can take showers. You may only be allowed to take sponge baths for bathing. When you are allowed to shower: Ask your health care provider to teach you how to wrap the PICC line. Cover the PICC line with clear plastic wrap and tape to keep it dry while showering. Follow instructions from your health care provider about how to take care of your insertion site and dressing. Make sure you: Wash your hands with soap and water before you change your bandage (dressing). If soap and water are not available,  use hand sanitizer. Change your dressing as told by your health care provider. Leave stitches (sutures), skin glue, or adhesive strips in place. These skin closures may need to stay in place for 2 weeks or longer. If adhesive strip edges start to loosen and curl up, you may trim the loose edges. Do not remove adhesive strips completely unless your health care provider tells you to do that. Change your PICC dressing if it becomes loose or wet. General  instructions  Carry your PICC identification card or wear a medical alert bracelet at all times. Keep the tube clamped at all times, unless it is being used. Carry a smooth-edge clamp with you at all times to place on the tube if it breaks. Do not use scissors or sharp objects near the tube. You may bend your arm and move it freely. If your PICC is near or at the bend of your elbow, avoid activity with repeated motion at the elbow. Avoid lifting heavy objects as told by your health care provider. Keep all follow-up visits as told by your health care provider. This is important.  Disposal of supplies Throw away any syringes in a disposal container that is meant for sharp items (sharps container). You can buy a sharps container from a pharmacy, or you can make one by using an empty hard plastic bottle with a cover. Place any used dressings or infusion bags into a plastic bag. Throw that bag in the trash. Contact a health care provider if: You have pain in your arm, ear, face, or teeth. You have a fever or chills. You have redness, swelling, or pain around the insertion site. You have fluid or blood coming from the insertion site. Your insertion site feels warm to the touch. You have pus or a bad smell coming from the insertion site. Your skin feels hard and raised around the insertion site. Get help right away if: Your PICC is accidentally pulled all the way out. If this happens, cover the insertion site with a bandage or gauze dressing. Do not throw the PICC away. Your health care provider will need to check it. Your PICC was tugged or pulled and has partially come out. Do not  push the PICC back in. You cannot flush the PICC, it is hard to flush, or the PICC leaks around the insertion site when it is flushed. You hear a "flushing" sound when the PICC is flushed. You feel your heart racing or skipping beats. There is a hole or tear in the PICC. You have swelling in the arm in which the  PICC was inserted. You have a red streak going up your arm from where the PICC was inserted. Summary A peripherally inserted central catheter (PICC) is a long, thin, flexible tube (catheter) that is inserted into a vein in the upper arm. The PICC is inserted using a sterile technique by a specially trained nurse or physician. Only a trained clinical professional should remove it. Keep your PICC identification card with you at all times. Avoid blood pressure checks on the arm in which the PICC is placed. If cared for properly, a PICC can remain in place for several months. Having a PICC can also allow a person to go home from the hospital sooner. This information is not intended to replace advice given to you by your health care provider. Make sure you discuss any questions you have with your healthcare provider. Document Revised: 08/20/2019 Document Reviewed: 08/20/2019 Elsevier Patient Education  2022 [employer].

## 2020-12-07 NOTE — Telephone Encounter (Signed)
Per 12/07/20 los - called and lvm of upcoming appointments - requested call back to reschedule - mailed calendar

## 2020-12-07 NOTE — Progress Notes (Signed)
Hematology and Oncology Follow Up Visit  CASSIA FEIN [id number] 1966-01-07 55 y.o. 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 Swing is here today for follow-up and to start cycle 2 of treatment. She had her port removed and now has a right upper extremity PICC line in place.  She had some mild fatigue for a day or two after her first cycle of treatment. She states that the cold sensitivity lasted around 5 days after.  She only took one compazine the day after and has had no issues.  She is walking local park trails for exercise and enjoying the beautiful weather.  No fever, chills, n/v, cough, rash, dizziness, SOB, chest pain, palpitations, abdominal pain or changes in bowel or bladder habits.  She has occasional hot flashes.  No swelling, tenderness, numbness or tingling in her extremities at this time.  No falls or syncope.  She has maintained a good appetite and is staying well hydrated. Her weight is stable at 283 lbs.   ECOG Performance Status: 1 - Symptomatic but completely ambulatory  Medications:  Allergies as of 12/07/2020   Not on File      Medication List        Accurate as of December 07, 2020 10:07 AM. If you have any questions, ask your nurse or doctor.          dexamethasone 4 MG tablet Commonly known as: DECADRON Take 2 tablets (8 mg total) by mouth daily. Start the day after chemotherapy for 3 days. Take with food.   lidocaine-prilocaine cream Commonly known as: EMLA Apply to affected area once   loperamide 2 MG tablet Commonly known as: Imodium A-D Take 2 at onset of diarrhea, then 1 every 2hrs until 12hr without a BM. May take 2 tab every 4hrs at bedtime. If diarrhea recurs repeat.   ondansetron 8 MG tablet Commonly known as: Zofran Take 1 tablet (8 mg total) by mouth 2 (two) times daily as needed.  Start on day 3 after chemotherapy.   oxyCODONE 5 MG immediate release tablet Commonly known as: Oxy IR/ROXICODONE Take 1 tablet (5 mg total) by mouth every 6 (six) hours as needed for breakthrough pain.   prochlorperazine 10 MG tablet Commonly known as: COMPAZINE Take 1 tablet (10 mg total) by mouth every 6 (six) hours as needed (Nausea or vomiting).        Allergies: Not on File  Past Medical History, Surgical history, Social history, and Family History were reviewed and updated.  Review of Systems: All other 10 point review of systems is negative.   Physical Exam:  weight is 283 lb 6.4 oz (128.5 kg). Her oral temperature is 98.1 F (36.7 C). Her blood pressure is 120/82 and her pulse is 70. Her respiration is 18 and oxygen saturation is 100%.   Wt Readings from Last 3 Encounters:  12/07/20 283 lb 6.4 oz (128.5 kg)  11/24/20 278 lb 0.6 oz (126.1 kg)  11/01/20 284 lb 1.9 oz (128.9 kg)    Ocular: Sclerae unicteric, pupils equal, round and reactive to light Ear-nose-throat: Oropharynx clear, dentition fair Lymphatic: No cervical or supraclavicular adenopathy Lungs no rales or rhonchi, good excursion bilaterally Heart regular rate and rhythm, no murmur appreciated Abd soft, nontender, positive bowel sounds MSK no focal spinal tenderness, no joint edema Neuro:  non-focal, well-oriented, appropriate affect Breasts: Deferred   Lab Results  Component Value Date   WBC 7.6 12/07/2020   HGB 11.2 (L) 12/07/2020   HCT 34.8 (L) 12/07/2020   MCV 90.6 12/07/2020   PLT 515 (H) 12/07/2020   Lab Results  Component Value Date   FERRITIN 93 11/01/2020   IRON 88 11/01/2020   TIBC 219 (L) 11/01/2020   UIBC 131 11/01/2020   IRONPCTSAT 40 11/01/2020   Lab Results  Component Value Date   RBC 3.84 (L) 12/07/2020   No results found for: KPAFRELGTCHN, LAMBDASER, KAPLAMBRATIO No results found for: IGGSERUM, IGA, IGMSERUM No results found for: Ronnald Ramp, A1GS, A2GS,  Violet Baldy, MSPIKE, SPEI   Chemistry      Component Value Date/Time   NA 142 12/07/2020 0930   K 3.7 12/07/2020 0930   CL 105 12/07/2020 0930   CO2 29 12/07/2020 0930   BUN 12 12/07/2020 0930   CREATININE 0.73 12/07/2020 0930      Component Value Date/Time   CALCIUM 9.1 12/07/2020 0930   ALKPHOS 62 12/07/2020 0930   AST 15 12/07/2020 0930   ALT 23 12/07/2020 0930   BILITOT 0.3 12/07/2020 0930       Impression and Plan: Ms. Duling is a very pleasant 37 African American female with metastatic adenocarcinoma of the cecum.  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 She tolerated her first cycle of FOLFOXIRI nicely.  CEA 2 weeks ago was down to 7.22.  Thankfully she did well with having her port removed and right upper arm PICC placed. This seems to be working nicely.  We will proceed with cycle 2 today as planned. We will plan to repeat scans after cycle 4.  Follow-up in 2 weeks.  She can contact our office with any questions or concerns.   Laverna Peace, NP 8/16/202210:07 AM

## 2020-12-07 NOTE — Progress Notes (Signed)
Patient here to restart treatment after her last cycle was cancelled due to port malfunction. Patient had PICC placed on 11/25/18 and since that time was unaware that it needed to be flushed.   Per request a flush treatment plan was placed and patient message sent to scheduling to MWF flushes on non-treatment weeks.   Oncology Nurse Navigator Documentation  Oncology Nurse Navigator Flowsheets 12/07/2020  Abnormal Finding Date -  Confirmed Diagnosis Date -  Diagnosis Status -  Phase of Treatment -  Chemotherapy Pending- Reason: -  Chemotherapy Actual Start Date: -  Chemotherapy Expected End Date: -  Surgery Actual Start Date: -  Navigator Follow Up Date: 12/21/2020  Navigator Follow Up Reason: Follow-up Appointment;Chemotherapy  Navigator Location [facility]  Referral Date to RadOnc/MedOnc -  Navigator Encounter Type Treatment;Appt/Treatment Plan Review  Telephone -  Treatment Initiated Date -  Patient Visit Type MedOnc  Treatment Phase Active Tx  Barriers/Navigation Needs Coordination of Care;Education  Education -  Interventions Coordination of Care  Acuity Level 2-Minimal Needs (1-2 Barriers Identified)  Coordination of Care Appts  Education Method -  Support Groups/Services Friends and Family  Time Spent with Patient 30

## 2020-12-08 ENCOUNTER — Other Ambulatory Visit: Payer: Self-pay | Admitting: Family

## 2020-12-08 LAB — IRON AND TIBC
Iron: 49 ug/dL (ref 41–142)
Saturation Ratios: 25 % (ref 21–57)
TIBC: 194 ug/dL — ABNORMAL LOW (ref 236–444)
UIBC: 145 ug/dL (ref 120–384)

## 2020-12-08 LAB — FERRITIN: Ferritin: 162 ng/mL (ref 11–307)

## 2020-12-09 ENCOUNTER — Inpatient Hospital Stay: Payer: 59

## 2020-12-09 ENCOUNTER — Other Ambulatory Visit: Payer: Self-pay

## 2020-12-09 VITALS — BP 129/83 | HR 61 | Temp 97.7°F | Resp 18

## 2020-12-09 DIAGNOSIS — Z5111 Encounter for antineoplastic chemotherapy: Secondary | ICD-10-CM | POA: Diagnosis not present

## 2020-12-09 DIAGNOSIS — C180 Malignant neoplasm of cecum: Secondary | ICD-10-CM

## 2020-12-09 MED ORDER — HEPARIN SOD (PORK) LOCK FLUSH 100 UNIT/ML IV SOLN
250.0000 [IU] | Freq: Once | INTRAVENOUS | Status: AC | PRN
  Administered 2020-12-09: 250 [IU]

## 2020-12-09 MED ORDER — SODIUM CHLORIDE 0.9% FLUSH
10.0000 mL | INTRAVENOUS | Status: DC | PRN
  Administered 2020-12-09: 10 mL

## 2020-12-10 ENCOUNTER — Encounter: Payer: Self-pay | Admitting: Hematology & Oncology

## 2020-12-10 ENCOUNTER — Encounter: Payer: Self-pay | Admitting: Family

## 2020-12-13 ENCOUNTER — Inpatient Hospital Stay: Payer: 59

## 2020-12-13 ENCOUNTER — Other Ambulatory Visit: Payer: Self-pay

## 2020-12-13 VITALS — BP 105/90 | HR 107 | Temp 98.0°F | Resp 20

## 2020-12-13 DIAGNOSIS — C180 Malignant neoplasm of cecum: Secondary | ICD-10-CM

## 2020-12-13 DIAGNOSIS — Z5111 Encounter for antineoplastic chemotherapy: Secondary | ICD-10-CM | POA: Diagnosis not present

## 2020-12-13 MED ORDER — HEPARIN SOD (PORK) LOCK FLUSH 100 UNIT/ML IV SOLN
500.0000 [IU] | Freq: Once | INTRAVENOUS | Status: AC
  Administered 2020-12-13: 500 [IU]

## 2020-12-13 MED ORDER — SODIUM CHLORIDE 0.9% FLUSH
10.0000 mL | Freq: Once | INTRAVENOUS | Status: AC
  Administered 2020-12-13: 10 mL

## 2020-12-13 NOTE — Patient Instructions (Signed)
PICC Home Care Guide A peripherally inserted central catheter (PICC) is a form of IV access that allows medicines and IV fluids to be quickly distributed throughout the body. The PICC is a long, thin, flexible tube (catheter) that is inserted into a vein in the upper arm. The catheter ends in a large vein in the chest (superior vena cava, or SVC). After the PICC is inserted, a chest X-ray may be done to make surethat it is in the correct place. A PICC may be placed for different reasons, such as: To give medicines and liquid nutrition. To give IV fluids and blood products. If there is trouble placing a peripheral intravenous (PIV) catheter. If taken care of properly, a PICC can remain in place for several months. Having a PICC can also allow a person to go home from the hospital sooner. Medicine and PICC care can be managed at home by a family member, caregiver, orhome health care team. What are the risks? Generally, having a PICC is safe. However, problems may occur, including: A blood clot (thrombus) forming in or at the tip of the PICC. A blood clot forming in a vein (deep vein thrombosis) or traveling to the lung (pulmonary embolism). Inflammation of the vein (phlebitis) in which the PICC is placed. Infection. Central line associated blood stream infection (CLABSI) is a serious infection that often requires hospitalization. PICC movement (malposition). The PICC tip may move from its original position due to excessive physical activity, forceful coughing, sneezing, or vomiting. A break or cut in the PICC. It is important not to use scissors near the PICC. Nerve or tendon irritation or injury during PICC insertion. How to take care of your PICC Preventing problems You and any caregivers should wash your hands often with soap. Wash hands: Before touching the PICC line or the infusion device. Before changing a bandage (dressing). Flush the PICC as told by your health care provider. Let your  health care provider know right away if the PICC is hard to flush or does not flush. Do not use force to flush the PICC. Do not use a syringe that is less than 10 mL to flush the PICC. Avoid blood pressure checks on the arm in which the PICC is placed. Never pull or tug on the PICC. Do not take the PICC out yourself. Only a trained clinical professional should remove the PICC. Use clean and sterile supplies only. Keep the supplies in a dry place. Do not reuse needles, syringes, or any other supplies. Doing that can lead to infection. Keep pets and children away from your PICC line. Check the PICC insertion site every day for signs of infection. Check for: Leakage. Redness, swelling, or pain. Fluid or blood. Warmth. Pus or a bad smell. PICC dressing care Keep your PICC bandage (dressing) clean and dry to prevent infection. Do not take baths, swim, or use a hot tub until your health care provider approves. Ask your health care provider if you can take showers. You may only be allowed to take sponge baths for bathing. When you are allowed to shower: Ask your health care provider to teach you how to wrap the PICC line. Cover the PICC line with clear plastic wrap and tape to keep it dry while showering. Follow instructions from your health care provider about how to take care of your insertion site and dressing. Make sure you: Wash your hands with soap and water before you change your bandage (dressing). If soap and water are not available,  use hand sanitizer. Change your dressing as told by your health care provider. Leave stitches (sutures), skin glue, or adhesive strips in place. These skin closures may need to stay in place for 2 weeks or longer. If adhesive strip edges start to loosen and curl up, you may trim the loose edges. Do not remove adhesive strips completely unless your health care provider tells you to do that. Change your PICC dressing if it becomes loose or wet. General  instructions  Carry your PICC identification card or wear a medical alert bracelet at all times. Keep the tube clamped at all times, unless it is being used. Carry a smooth-edge clamp with you at all times to place on the tube if it breaks. Do not use scissors or sharp objects near the tube. You may bend your arm and move it freely. If your PICC is near or at the bend of your elbow, avoid activity with repeated motion at the elbow. Avoid lifting heavy objects as told by your health care provider. Keep all follow-up visits as told by your health care provider. This is important.  Disposal of supplies Throw away any syringes in a disposal container that is meant for sharp items (sharps container). You can buy a sharps container from a pharmacy, or you can make one by using an empty hard plastic bottle with a cover. Place any used dressings or infusion bags into a plastic bag. Throw that bag in the trash. Contact a health care provider if: You have pain in your arm, ear, face, or teeth. You have a fever or chills. You have redness, swelling, or pain around the insertion site. You have fluid or blood coming from the insertion site. Your insertion site feels warm to the touch. You have pus or a bad smell coming from the insertion site. Your skin feels hard and raised around the insertion site. Get help right away if: Your PICC is accidentally pulled all the way out. If this happens, cover the insertion site with a bandage or gauze dressing. Do not throw the PICC away. Your health care provider will need to check it. Your PICC was tugged or pulled and has partially come out. Do not  push the PICC back in. You cannot flush the PICC, it is hard to flush, or the PICC leaks around the insertion site when it is flushed. You hear a "flushing" sound when the PICC is flushed. You feel your heart racing or skipping beats. There is a hole or tear in the PICC. You have swelling in the arm in which the  PICC was inserted. You have a red streak going up your arm from where the PICC was inserted. Summary A peripherally inserted central catheter (PICC) is a long, thin, flexible tube (catheter) that is inserted into a vein in the upper arm. The PICC is inserted using a sterile technique by a specially trained nurse or physician. Only a trained clinical professional should remove it. Keep your PICC identification card with you at all times. Avoid blood pressure checks on the arm in which the PICC is placed. If cared for properly, a PICC can remain in place for several months. Having a PICC can also allow a person to go home from the hospital sooner. This information is not intended to replace advice given to you by your health care provider. Make sure you discuss any questions you have with your healthcare provider. Document Revised: 08/20/2019 Document Reviewed: 08/20/2019 Elsevier Patient Education  2022 [employer].

## 2020-12-15 ENCOUNTER — Other Ambulatory Visit: Payer: Self-pay

## 2020-12-15 ENCOUNTER — Inpatient Hospital Stay: Payer: 59

## 2020-12-15 DIAGNOSIS — C180 Malignant neoplasm of cecum: Secondary | ICD-10-CM

## 2020-12-15 DIAGNOSIS — Z5111 Encounter for antineoplastic chemotherapy: Secondary | ICD-10-CM | POA: Diagnosis not present

## 2020-12-15 MED ORDER — SODIUM CHLORIDE 0.9% FLUSH
10.0000 mL | Freq: Once | INTRAVENOUS | Status: AC
  Administered 2020-12-15: 10 mL

## 2020-12-15 MED ORDER — HEPARIN SOD (PORK) LOCK FLUSH 100 UNIT/ML IV SOLN
250.0000 [IU] | Freq: Once | INTRAVENOUS | Status: AC
  Administered 2020-12-15: 250 [IU]

## 2020-12-15 NOTE — Patient Instructions (Signed)
PICC Home Care Guide A peripherally inserted central catheter (PICC) is a form of IV access that allows medicines and IV fluids to be quickly distributed throughout the body. The PICC is a long, thin, flexible tube (catheter) that is inserted into a vein in the upper arm. The catheter ends in a large vein in the chest (superior vena cava, or SVC). After the PICC is inserted, a chest X-ray may be done to make surethat it is in the correct place. A PICC may be placed for different reasons, such as: To give medicines and liquid nutrition. To give IV fluids and blood products. If there is trouble placing a peripheral intravenous (PIV) catheter. If taken care of properly, a PICC can remain in place for several months. Having a PICC can also allow a person to go home from the hospital sooner. Medicine and PICC care can be managed at home by a family member, caregiver, orhome health care team. What are the risks? Generally, having a PICC is safe. However, problems may occur, including: A blood clot (thrombus) forming in or at the tip of the PICC. A blood clot forming in a vein (deep vein thrombosis) or traveling to the lung (pulmonary embolism). Inflammation of the vein (phlebitis) in which the PICC is placed. Infection. Central line associated blood stream infection (CLABSI) is a serious infection that often requires hospitalization. PICC movement (malposition). The PICC tip may move from its original position due to excessive physical activity, forceful coughing, sneezing, or vomiting. A break or cut in the PICC. It is important not to use scissors near the PICC. Nerve or tendon irritation or injury during PICC insertion. How to take care of your PICC Preventing problems You and any caregivers should wash your hands often with soap. Wash hands: Before touching the PICC line or the infusion device. Before changing a bandage (dressing). Flush the PICC as told by your health care provider. Let your  health care provider know right away if the PICC is hard to flush or does not flush. Do not use force to flush the PICC. Do not use a syringe that is less than 10 mL to flush the PICC. Avoid blood pressure checks on the arm in which the PICC is placed. Never pull or tug on the PICC. Do not take the PICC out yourself. Only a trained clinical professional should remove the PICC. Use clean and sterile supplies only. Keep the supplies in a dry place. Do not reuse needles, syringes, or any other supplies. Doing that can lead to infection. Keep pets and children away from your PICC line. Check the PICC insertion site every day for signs of infection. Check for: Leakage. Redness, swelling, or pain. Fluid or blood. Warmth. Pus or a bad smell. PICC dressing care Keep your PICC bandage (dressing) clean and dry to prevent infection. Do not take baths, swim, or use a hot tub until your health care provider approves. Ask your health care provider if you can take showers. You may only be allowed to take sponge baths for bathing. When you are allowed to shower: Ask your health care provider to teach you how to wrap the PICC line. Cover the PICC line with clear plastic wrap and tape to keep it dry while showering. Follow instructions from your health care provider about how to take care of your insertion site and dressing. Make sure you: Wash your hands with soap and water before you change your bandage (dressing). If soap and water are not available,  use hand sanitizer. Change your dressing as told by your health care provider. Leave stitches (sutures), skin glue, or adhesive strips in place. These skin closures may need to stay in place for 2 weeks or longer. If adhesive strip edges start to loosen and curl up, you may trim the loose edges. Do not remove adhesive strips completely unless your health care provider tells you to do that. Change your PICC dressing if it becomes loose or wet. General  instructions  Carry your PICC identification card or wear a medical alert bracelet at all times. Keep the tube clamped at all times, unless it is being used. Carry a smooth-edge clamp with you at all times to place on the tube if it breaks. Do not use scissors or sharp objects near the tube. You may bend your arm and move it freely. If your PICC is near or at the bend of your elbow, avoid activity with repeated motion at the elbow. Avoid lifting heavy objects as told by your health care provider. Keep all follow-up visits as told by your health care provider. This is important.  Disposal of supplies Throw away any syringes in a disposal container that is meant for sharp items (sharps container). You can buy a sharps container from a pharmacy, or you can make one by using an empty hard plastic bottle with a cover. Place any used dressings or infusion bags into a plastic bag. Throw that bag in the trash. Contact a health care provider if: You have pain in your arm, ear, face, or teeth. You have a fever or chills. You have redness, swelling, or pain around the insertion site. You have fluid or blood coming from the insertion site. Your insertion site feels warm to the touch. You have pus or a bad smell coming from the insertion site. Your skin feels hard and raised around the insertion site. Get help right away if: Your PICC is accidentally pulled all the way out. If this happens, cover the insertion site with a bandage or gauze dressing. Do not throw the PICC away. Your health care provider will need to check it. Your PICC was tugged or pulled and has partially come out. Do not  push the PICC back in. You cannot flush the PICC, it is hard to flush, or the PICC leaks around the insertion site when it is flushed. You hear a "flushing" sound when the PICC is flushed. You feel your heart racing or skipping beats. There is a hole or tear in the PICC. You have swelling in the arm in which the  PICC was inserted. You have a red streak going up your arm from where the PICC was inserted. Summary A peripherally inserted central catheter (PICC) is a long, thin, flexible tube (catheter) that is inserted into a vein in the upper arm. The PICC is inserted using a sterile technique by a specially trained nurse or physician. Only a trained clinical professional should remove it. Keep your PICC identification card with you at all times. Avoid blood pressure checks on the arm in which the PICC is placed. If cared for properly, a PICC can remain in place for several months. Having a PICC can also allow a person to go home from the hospital sooner. This information is not intended to replace advice given to you by your health care provider. Make sure you discuss any questions you have with your healthcare provider. Document Revised: 08/20/2019 Document Reviewed: 08/20/2019 Elsevier Patient Education  2022 [employer].

## 2020-12-17 ENCOUNTER — Other Ambulatory Visit: Payer: Self-pay

## 2020-12-17 ENCOUNTER — Inpatient Hospital Stay: Payer: 59

## 2020-12-17 VITALS — BP 109/72 | HR 101 | Temp 98.8°F | Resp 18

## 2020-12-17 DIAGNOSIS — Z95828 Presence of other vascular implants and grafts: Secondary | ICD-10-CM

## 2020-12-17 DIAGNOSIS — Z5111 Encounter for antineoplastic chemotherapy: Secondary | ICD-10-CM | POA: Diagnosis not present

## 2020-12-17 MED ORDER — SODIUM CHLORIDE 0.9% FLUSH
10.0000 mL | Freq: Once | INTRAVENOUS | Status: AC
  Administered 2020-12-17: 10 mL via INTRAVENOUS

## 2020-12-17 MED ORDER — HEPARIN SOD (PORK) LOCK FLUSH 100 UNIT/ML IV SOLN
500.0000 [IU] | Freq: Once | INTRAVENOUS | Status: AC
  Administered 2020-12-17: 250 [IU] via INTRAVENOUS

## 2020-12-17 NOTE — Patient Instructions (Signed)
PICC Home Care Guide A peripherally inserted central catheter (PICC) is a form of IV access that allows medicines and IV fluids to be quickly distributed throughout the body. The PICC is a long, thin, flexible tube (catheter) that is inserted into a vein in the upper arm. The catheter ends in a large vein in the chest (superior vena cava, or SVC). After the PICC is inserted, a chest X-ray may be done to make surethat it is in the correct place. A PICC may be placed for different reasons, such as: To give medicines and liquid nutrition. To give IV fluids and blood products. If there is trouble placing a peripheral intravenous (PIV) catheter. If taken care of properly, a PICC can remain in place for several months. Having a PICC can also allow a person to go home from the hospital sooner. Medicine and PICC care can be managed at home by a family member, caregiver, orhome health care team. What are the risks? Generally, having a PICC is safe. However, problems may occur, including: A blood clot (thrombus) forming in or at the tip of the PICC. A blood clot forming in a vein (deep vein thrombosis) or traveling to the lung (pulmonary embolism). Inflammation of the vein (phlebitis) in which the PICC is placed. Infection. Central line associated blood stream infection (CLABSI) is a serious infection that often requires hospitalization. PICC movement (malposition). The PICC tip may move from its original position due to excessive physical activity, forceful coughing, sneezing, or vomiting. A break or cut in the PICC. It is important not to use scissors near the PICC. Nerve or tendon irritation or injury during PICC insertion. How to take care of your PICC Preventing problems You and any caregivers should wash your hands often with soap. Wash hands: Before touching the PICC line or the infusion device. Before changing a bandage (dressing). Flush the PICC as told by your health care provider. Let your  health care provider know right away if the PICC is hard to flush or does not flush. Do not use force to flush the PICC. Do not use a syringe that is less than 10 mL to flush the PICC. Avoid blood pressure checks on the arm in which the PICC is placed. Never pull or tug on the PICC. Do not take the PICC out yourself. Only a trained clinical professional should remove the PICC. Use clean and sterile supplies only. Keep the supplies in a dry place. Do not reuse needles, syringes, or any other supplies. Doing that can lead to infection. Keep pets and children away from your PICC line. Check the PICC insertion site every day for signs of infection. Check for: Leakage. Redness, swelling, or pain. Fluid or blood. Warmth. Pus or a bad smell. PICC dressing care Keep your PICC bandage (dressing) clean and dry to prevent infection. Do not take baths, swim, or use a hot tub until your health care provider approves. Ask your health care provider if you can take showers. You may only be allowed to take sponge baths for bathing. When you are allowed to shower: Ask your health care provider to teach you how to wrap the PICC line. Cover the PICC line with clear plastic wrap and tape to keep it dry while showering. Follow instructions from your health care provider about how to take care of your insertion site and dressing. Make sure you: Wash your hands with soap and water before you change your bandage (dressing). If soap and water are not available,  use hand sanitizer. Change your dressing as told by your health care provider. Leave stitches (sutures), skin glue, or adhesive strips in place. These skin closures may need to stay in place for 2 weeks or longer. If adhesive strip edges start to loosen and curl up, you may trim the loose edges. Do not remove adhesive strips completely unless your health care provider tells you to do that. Change your PICC dressing if it becomes loose or wet. General  instructions  Carry your PICC identification card or wear a medical alert bracelet at all times. Keep the tube clamped at all times, unless it is being used. Carry a smooth-edge clamp with you at all times to place on the tube if it breaks. Do not use scissors or sharp objects near the tube. You may bend your arm and move it freely. If your PICC is near or at the bend of your elbow, avoid activity with repeated motion at the elbow. Avoid lifting heavy objects as told by your health care provider. Keep all follow-up visits as told by your health care provider. This is important.  Disposal of supplies Throw away any syringes in a disposal container that is meant for sharp items (sharps container). You can buy a sharps container from a pharmacy, or you can make one by using an empty hard plastic bottle with a cover. Place any used dressings or infusion bags into a plastic bag. Throw that bag in the trash. Contact a health care provider if: You have pain in your arm, ear, face, or teeth. You have a fever or chills. You have redness, swelling, or pain around the insertion site. You have fluid or blood coming from the insertion site. Your insertion site feels warm to the touch. You have pus or a bad smell coming from the insertion site. Your skin feels hard and raised around the insertion site. Get help right away if: Your PICC is accidentally pulled all the way out. If this happens, cover the insertion site with a bandage or gauze dressing. Do not throw the PICC away. Your health care provider will need to check it. Your PICC was tugged or pulled and has partially come out. Do not  push the PICC back in. You cannot flush the PICC, it is hard to flush, or the PICC leaks around the insertion site when it is flushed. You hear a "flushing" sound when the PICC is flushed. You feel your heart racing or skipping beats. There is a hole or tear in the PICC. You have swelling in the arm in which the  PICC was inserted. You have a red streak going up your arm from where the PICC was inserted. Summary A peripherally inserted central catheter (PICC) is a long, thin, flexible tube (catheter) that is inserted into a vein in the upper arm. The PICC is inserted using a sterile technique by a specially trained nurse or physician. Only a trained clinical professional should remove it. Keep your PICC identification card with you at all times. Avoid blood pressure checks on the arm in which the PICC is placed. If cared for properly, a PICC can remain in place for several months. Having a PICC can also allow a person to go home from the hospital sooner. This information is not intended to replace advice given to you by your health care provider. Make sure you discuss any questions you have with your healthcare provider. Document Revised: 08/20/2019 Document Reviewed: 08/20/2019 Elsevier Patient Education  2022 [employer].

## 2020-12-20 ENCOUNTER — Other Ambulatory Visit: Payer: Self-pay | Admitting: Hematology & Oncology

## 2020-12-21 ENCOUNTER — Inpatient Hospital Stay: Payer: 59

## 2020-12-21 ENCOUNTER — Encounter: Payer: Self-pay | Admitting: *Deleted

## 2020-12-21 ENCOUNTER — Encounter: Payer: Self-pay | Admitting: Family

## 2020-12-21 ENCOUNTER — Other Ambulatory Visit: Payer: Self-pay

## 2020-12-21 ENCOUNTER — Inpatient Hospital Stay (HOSPITAL_BASED_OUTPATIENT_CLINIC_OR_DEPARTMENT_OTHER): Payer: 59 | Admitting: Family

## 2020-12-21 ENCOUNTER — Telehealth: Payer: Self-pay | Admitting: *Deleted

## 2020-12-21 VITALS — BP 126/75 | HR 79 | Temp 97.9°F | Resp 18 | Ht 63.0 in | Wt 278.0 lb

## 2020-12-21 DIAGNOSIS — D509 Iron deficiency anemia, unspecified: Secondary | ICD-10-CM | POA: Diagnosis not present

## 2020-12-21 DIAGNOSIS — C180 Malignant neoplasm of cecum: Secondary | ICD-10-CM | POA: Diagnosis not present

## 2020-12-21 DIAGNOSIS — Z5111 Encounter for antineoplastic chemotherapy: Secondary | ICD-10-CM | POA: Diagnosis not present

## 2020-12-21 LAB — CBC WITH DIFFERENTIAL (CANCER CENTER ONLY)
Abs Immature Granulocytes: 0.01 10*3/uL (ref 0.00–0.07)
Basophils Absolute: 0 10*3/uL (ref 0.0–0.1)
Basophils Relative: 1 %
Eosinophils Absolute: 0.1 10*3/uL (ref 0.0–0.5)
Eosinophils Relative: 2 %
HCT: 33.9 % — ABNORMAL LOW (ref 36.0–46.0)
Hemoglobin: 11 g/dL — ABNORMAL LOW (ref 12.0–15.0)
Immature Granulocytes: 0 %
Lymphocytes Relative: 44 %
Lymphs Abs: 1.5 10*3/uL (ref 0.7–4.0)
MCH: 29.2 pg (ref 26.0–34.0)
MCHC: 32.4 g/dL (ref 30.0–36.0)
MCV: 89.9 fL (ref 80.0–100.0)
Monocytes Absolute: 0.4 10*3/uL (ref 0.1–1.0)
Monocytes Relative: 10 %
Neutro Abs: 1.5 10*3/uL — ABNORMAL LOW (ref 1.7–7.7)
Neutrophils Relative %: 43 %
Platelet Count: 208 10*3/uL (ref 150–400)
RBC: 3.77 MIL/uL — ABNORMAL LOW (ref 3.87–5.11)
RDW: 15.8 % — ABNORMAL HIGH (ref 11.5–15.5)
WBC Count: 3.4 10*3/uL — ABNORMAL LOW (ref 4.0–10.5)
nRBC: 0 % (ref 0.0–0.2)

## 2020-12-21 LAB — CMP (CANCER CENTER ONLY)
ALT: 28 U/L (ref 0–44)
AST: 15 U/L (ref 15–41)
Albumin: 3.4 g/dL — ABNORMAL LOW (ref 3.5–5.0)
Alkaline Phosphatase: 67 U/L (ref 38–126)
Anion gap: 10 (ref 5–15)
BUN: 8 mg/dL (ref 6–20)
CO2: 28 mmol/L (ref 22–32)
Calcium: 8.7 mg/dL — ABNORMAL LOW (ref 8.9–10.3)
Chloride: 104 mmol/L (ref 98–111)
Creatinine: 0.77 mg/dL (ref 0.44–1.00)
GFR, Estimated: >60 mL/min (ref >60)
Glucose, Bld: 120 mg/dL — ABNORMAL HIGH (ref 70–99)
Potassium: 3.5 mmol/L (ref 3.5–5.1)
Sodium: 142 mmol/L (ref 135–145)
Total Bilirubin: 0.4 mg/dL (ref 0.3–1.2)
Total Protein: 6.1 g/dL — ABNORMAL LOW (ref 6.5–8.1)

## 2020-12-21 LAB — TOTAL PROTEIN, URINE DIPSTICK: Protein, ur: NEGATIVE mg/dL

## 2020-12-21 LAB — CEA (IN HOUSE-CHCC): CEA (CHCC-In House): 7.51 ng/mL — ABNORMAL HIGH (ref 0.00–5.00)

## 2020-12-21 LAB — LACTATE DEHYDROGENASE: LDH: 145 U/L (ref 98–192)

## 2020-12-21 MED ORDER — SODIUM CHLORIDE 0.9 % IV SOLN
165.0000 mg/m2 | Freq: Once | INTRAVENOUS | Status: AC
  Administered 2020-12-21: 400 mg via INTRAVENOUS
  Filled 2020-12-21: qty 15

## 2020-12-21 MED ORDER — SODIUM CHLORIDE 0.9 % IV SOLN: Freq: Once | INTRAVENOUS | Status: AC

## 2020-12-21 MED ORDER — DEXTROSE 5 % IV SOLN: Freq: Once | INTRAVENOUS | Status: AC

## 2020-12-21 MED ORDER — SODIUM CHLORIDE 0.9 % IV SOLN
2400.0000 mg/m2 | INTRAVENOUS | Status: AC
  Administered 2020-12-21: 5750 mg via INTRAVENOUS
  Filled 2020-12-21: qty 115

## 2020-12-21 MED ORDER — PALONOSETRON HCL INJECTION 0.25 MG/5ML
0.2500 mg | Freq: Once | INTRAVENOUS | Status: AC
  Administered 2020-12-21: 0.25 mg via INTRAVENOUS
  Filled 2020-12-21: qty 5

## 2020-12-21 MED ORDER — LORAZEPAM 2 MG/ML IJ SOLN
0.5000 mg | Freq: Once | INTRAMUSCULAR | Status: AC
  Administered 2020-12-21: 0.5 mg via INTRAVENOUS
  Filled 2020-12-21: qty 1

## 2020-12-21 MED ORDER — HEPARIN SOD (PORK) LOCK FLUSH 100 UNIT/ML IV SOLN: 500.0000 [IU] | Freq: Once | INTRAVENOUS | Status: DC | PRN

## 2020-12-21 MED ORDER — ATROPINE SULFATE 1 MG/ML IJ SOLN
0.5000 mg | Freq: Once | INTRAMUSCULAR | Status: AC | PRN
  Administered 2020-12-21: 0.5 mg via INTRAVENOUS
  Filled 2020-12-21: qty 1

## 2020-12-21 MED ORDER — SODIUM CHLORIDE 0.9 % IV SOLN
10.0000 mg | Freq: Once | INTRAVENOUS | Status: AC
  Administered 2020-12-21: 10 mg via INTRAVENOUS
  Filled 2020-12-21: qty 10

## 2020-12-21 MED ORDER — SODIUM CHLORIDE 0.9% FLUSH: 10.0000 mL | INTRAVENOUS | Status: DC | PRN

## 2020-12-21 MED ORDER — OXALIPLATIN CHEMO INJECTION 100 MG/20ML
84.0000 mg/m2 | Freq: Once | INTRAVENOUS | Status: AC
  Administered 2020-12-21: 200 mg via INTRAVENOUS
  Filled 2020-12-21: qty 40

## 2020-12-21 MED ORDER — LEUCOVORIN CALCIUM INJECTION 350 MG
400.0000 mg/m2 | Freq: Once | INTRAVENOUS | Status: AC
  Administered 2020-12-21: 956 mg via INTRAVENOUS
  Filled 2020-12-21: qty 47.8

## 2020-12-21 MED ORDER — SODIUM CHLORIDE 0.9 % IV SOLN
150.0000 mg | Freq: Once | INTRAVENOUS | Status: AC
  Administered 2020-12-21: 150 mg via INTRAVENOUS
  Filled 2020-12-21: qty 150

## 2020-12-21 NOTE — Telephone Encounter (Signed)
Per 12/21/20 los - gave upcoming appointments - confirmed - print calendar

## 2020-12-21 NOTE — Patient Instructions (Signed)
[facility]  Discharge Instructions: Thank you for choosing [facility] to provide your oncology and hematology care.   If you have a lab appointment with the [facility], please go directly to the [facility] and check in at the registration area.  Wear comfortable clothing and clothing appropriate for easy access to any Portacath or PICC line.   We strive to give you quality time with your provider. You may need to reschedule your appointment if you arrive late (15 or more minutes).  Arriving late affects you and other patients whose appointments are after yours.  Also, if you miss three or more appointments without notifying the office, you may be dismissed from the clinic at the provider's discretion.      For prescription refill requests, have your pharmacy contact our office and allow 72 hours for refills to be completed.    Today you received the following chemotherapy and/or immunotherapy agents Irrinotecan, Oxaliplatin, Leukovorin, 5FU      To help prevent nausea and vomiting after your treatment, we encourage you to take your nausea medication as directed.  BELOW ARE SYMPTOMS THAT SHOULD BE REPORTED IMMEDIATELY: *FEVER GREATER THAN 100.4 F (38 C) OR HIGHER *CHILLS OR SWEATING *NAUSEA AND VOMITING THAT IS NOT CONTROLLED WITH YOUR NAUSEA MEDICATION *UNUSUAL SHORTNESS OF BREATH *UNUSUAL BRUISING OR BLEEDING *URINARY PROBLEMS (pain or burning when urinating, or frequent urination) *BOWEL PROBLEMS (unusual diarrhea, constipation, pain near the anus) TENDERNESS IN MOUTH AND THROAT WITH OR WITHOUT PRESENCE OF ULCERS (sore throat, sores in mouth, or a toothache) UNUSUAL RASH, SWELLING OR PAIN  UNUSUAL VAGINAL DISCHARGE OR ITCHING   Items with * indicate a potential emergency and should be followed up as soon as possible or go to the Emergency Department if any problems should occur.  Please show the CHEMOTHERAPY ALERT CARD or IMMUNOTHERAPY  ALERT CARD at check-in to the Emergency Department and triage nurse. Should you have questions after your visit or need to cancel or reschedule your appointment, please contact [facility]  [phone] and follow the prompts.  Office hours are 8:00 a.m. to 4:30 p.m. Monday - Friday. Please note that voicemails left after 4:00 p.m. may not be returned until the following business day.  We are closed weekends and major holidays. You have access to a nurse at all times for urgent questions. Please call the main number to the clinic [phone] and follow the prompts.  For any non-urgent questions, you may also contact your provider using MyChart. We now offer e-Visits for anyone 76 and older to request care online for non-urgent symptoms. For details visit [email or web address].   Also download the MyChart app! Go to the app store, search "MyChart", open the app, select [facility], and log in with your MyChart username and password.  Due to Covid, a mask is required upon entering the hospital/clinic. If you do not have a mask, one will be given to you upon arrival. For doctor visits, patients may have 1 support person aged 34 or older with them. For treatment visits, patients cannot have anyone with them due to current Covid guidelines and our immunocompromised population.

## 2020-12-21 NOTE — Progress Notes (Signed)
Hematology and Oncology Follow Up Visit  BASIL BLAKESLEY [id number] 1965-05-08 55 y.o. 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 Artiaga is here today for follow-up and treatment. She is doing well but noted that she had a little more fatigue after her second cycle of treatment. She takes a break to rest as needed.  She has had some mild nausea which she states resolves with peppermint candy. No vomiting. She has not needed the Zofran or Compazine so far.  She had a little diarrhea that resolved with taking imodium.  No blood loss noted. No bruising or petechiae. No fever, chills, cough, rash, dizziness, SOB, chest pain, palpitations, abdominal pain or changes in bladder habits.  She has had little to no numbness and tingling in her hands and feet post treatment. She avoid cold drinks and foods for several days after.  She has maintained a good appetite and is staying well hydrated. Her weight is stable at 278 lbs.  No falls or syncope reported.   ECOG Performance Status: 1 - Symptomatic but completely ambulatory  Medications:  Allergies as of 12/21/2020   Not on File      Medication List        Accurate as of December 21, 2020  8:48 AM. If you have any questions, ask your nurse or doctor.          dexamethasone 4 MG tablet Commonly known as: DECADRON Take 2 tablets (8 mg total) by mouth daily. Start the day after chemotherapy for 3 days. Take with food.   lidocaine-prilocaine cream Commonly known as: EMLA Apply to affected area once   loperamide 2 MG tablet Commonly known as: Imodium A-D Take 2 at onset of diarrhea, then 1 every 2hrs until 12hr without a BM. May take 2 tab every 4hrs at bedtime. If diarrhea recurs repeat.   ondansetron 8 MG tablet Commonly known as: Zofran Take 1 tablet (8 mg total) by mouth 2 (two) times daily  as needed. Start on day 3 after chemotherapy.   oxyCODONE 5 MG immediate release tablet Commonly known as: Oxy IR/ROXICODONE Take 1 tablet (5 mg total) by mouth every 6 (six) hours as needed for breakthrough pain.   prochlorperazine 10 MG tablet Commonly known as: COMPAZINE Take 1 tablet (10 mg total) by mouth every 6 (six) hours as needed (Nausea or vomiting).        Allergies: Not on File  Past Medical History, Surgical history, Social history, and Family History were reviewed and updated.  Review of Systems: All other 10 point review of systems is negative.   Physical Exam:  vitals were not taken for this visit.   Wt Readings from Last 3 Encounters:  12/07/20 283 lb 6.4 oz (128.5 kg)  11/24/20 278 lb 0.6 oz (126.1 kg)  11/01/20 284 lb 1.9 oz (128.9 kg)    Ocular: Sclerae unicteric, pupils equal, round and reactive to light Ear-nose-throat: Oropharynx clear, dentition fair Lymphatic: No cervical or supraclavicular adenopathy Lungs no rales or rhonchi, good excursion bilaterally Heart regular rate and rhythm, no murmur appreciated Abd soft, nontender, positive bowel sounds MSK no focal spinal tenderness, no joint edema Neuro: non-focal, well-oriented, appropriate affect Breasts: Deferred   Lab Results  Component Value Date   WBC 7.6 12/07/2020   HGB 11.2 (L) 12/07/2020  HCT 34.8 (L) 12/07/2020   MCV 90.6 12/07/2020   PLT 515 (H) 12/07/2020   Lab Results  Component Value Date   FERRITIN 162 12/07/2020   IRON 49 12/07/2020   TIBC 194 (L) 12/07/2020   UIBC 145 12/07/2020   IRONPCTSAT 25 12/07/2020   Lab Results  Component Value Date   RBC 3.84 (L) 12/07/2020   No results found for: KPAFRELGTCHN, LAMBDASER, KAPLAMBRATIO No results found for: IGGSERUM, IGA, IGMSERUM No results found for: Ronnald Ramp, A1GS, A2GS, Violet Baldy, MSPIKE, SPEI   Chemistry      Component Value Date/Time   NA 142 12/07/2020 0930   K 3.7 12/07/2020 0930    CL 105 12/07/2020 0930   CO2 29 12/07/2020 0930   BUN 12 12/07/2020 0930   CREATININE 0.73 12/07/2020 0930      Component Value Date/Time   CALCIUM 9.1 12/07/2020 0930   ALKPHOS 62 12/07/2020 0930   AST 15 12/07/2020 0930   ALT 23 12/07/2020 0930   BILITOT 0.3 12/07/2020 0930       Impression and Plan: Ms. Vencill is a very pleasant 66 African American female with metastatic adenocarcinoma of the cecum.  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 CEA at her last visit was 6.44.  We will proceed with cycle 3 today as planned.  PICC flush 3 days a week.  Follow-up in 2 weeks. We will plan to repeat scans after cycle 4.   She can contact our office with any questions or concerns.   Laverna Peace, NP 8/30/20228:48 AM

## 2020-12-21 NOTE — Patient Instructions (Signed)
PICC Home Care Guide A peripherally inserted central catheter (PICC) is a form of IV access that allows medicines and IV fluids to be quickly distributed throughout the body. The PICC is a long, thin, flexible tube (catheter) that is inserted into a vein in the upper arm. The catheter ends in a large vein in the chest (superior vena cava, or SVC). After the PICC is inserted, a chest X-ray may be done to make surethat it is in the correct place. A PICC may be placed for different reasons, such as: To give medicines and liquid nutrition. To give IV fluids and blood products. If there is trouble placing a peripheral intravenous (PIV) catheter. If taken care of properly, a PICC can remain in place for several months. Having a PICC can also allow a person to go home from the hospital sooner. Medicine and PICC care can be managed at home by a family member, caregiver, orhome health care team. What are the risks? Generally, having a PICC is safe. However, problems may occur, including: A blood clot (thrombus) forming in or at the tip of the PICC. A blood clot forming in a vein (deep vein thrombosis) or traveling to the lung (pulmonary embolism). Inflammation of the vein (phlebitis) in which the PICC is placed. Infection. Central line associated blood stream infection (CLABSI) is a serious infection that often requires hospitalization. PICC movement (malposition). The PICC tip may move from its original position due to excessive physical activity, forceful coughing, sneezing, or vomiting. A break or cut in the PICC. It is important not to use scissors near the PICC. Nerve or tendon irritation or injury during PICC insertion. How to take care of your PICC Preventing problems You and any caregivers should wash your hands often with soap. Wash hands: Before touching the PICC line or the infusion device. Before changing a bandage (dressing). Flush the PICC as told by your health care provider. Let your  health care provider know right away if the PICC is hard to flush or does not flush. Do not use force to flush the PICC. Do not use a syringe that is less than 10 mL to flush the PICC. Avoid blood pressure checks on the arm in which the PICC is placed. Never pull or tug on the PICC. Do not take the PICC out yourself. Only a trained clinical professional should remove the PICC. Use clean and sterile supplies only. Keep the supplies in a dry place. Do not reuse needles, syringes, or any other supplies. Doing that can lead to infection. Keep pets and children away from your PICC line. Check the PICC insertion site every day for signs of infection. Check for: Leakage. Redness, swelling, or pain. Fluid or blood. Warmth. Pus or a bad smell. PICC dressing care Keep your PICC bandage (dressing) clean and dry to prevent infection. Do not take baths, swim, or use a hot tub until your health care provider approves. Ask your health care provider if you can take showers. You may only be allowed to take sponge baths for bathing. When you are allowed to shower: Ask your health care provider to teach you how to wrap the PICC line. Cover the PICC line with clear plastic wrap and tape to keep it dry while showering. Follow instructions from your health care provider about how to take care of your insertion site and dressing. Make sure you: Wash your hands with soap and water before you change your bandage (dressing). If soap and water are not available,  use hand sanitizer. Change your dressing as told by your health care provider. Leave stitches (sutures), skin glue, or adhesive strips in place. These skin closures may need to stay in place for 2 weeks or longer. If adhesive strip edges start to loosen and curl up, you may trim the loose edges. Do not remove adhesive strips completely unless your health care provider tells you to do that. Change your PICC dressing if it becomes loose or wet. General  instructions  Carry your PICC identification card or wear a medical alert bracelet at all times. Keep the tube clamped at all times, unless it is being used. Carry a smooth-edge clamp with you at all times to place on the tube if it breaks. Do not use scissors or sharp objects near the tube. You may bend your arm and move it freely. If your PICC is near or at the bend of your elbow, avoid activity with repeated motion at the elbow. Avoid lifting heavy objects as told by your health care provider. Keep all follow-up visits as told by your health care provider. This is important.  Disposal of supplies Throw away any syringes in a disposal container that is meant for sharp items (sharps container). You can buy a sharps container from a pharmacy, or you can make one by using an empty hard plastic bottle with a cover. Place any used dressings or infusion bags into a plastic bag. Throw that bag in the trash. Contact a health care provider if: You have pain in your arm, ear, face, or teeth. You have a fever or chills. You have redness, swelling, or pain around the insertion site. You have fluid or blood coming from the insertion site. Your insertion site feels warm to the touch. You have pus or a bad smell coming from the insertion site. Your skin feels hard and raised around the insertion site. Get help right away if: Your PICC is accidentally pulled all the way out. If this happens, cover the insertion site with a bandage or gauze dressing. Do not throw the PICC away. Your health care provider will need to check it. Your PICC was tugged or pulled and has partially come out. Do not  push the PICC back in. You cannot flush the PICC, it is hard to flush, or the PICC leaks around the insertion site when it is flushed. You hear a "flushing" sound when the PICC is flushed. You feel your heart racing or skipping beats. There is a hole or tear in the PICC. You have swelling in the arm in which the  PICC was inserted. You have a red streak going up your arm from where the PICC was inserted. Summary A peripherally inserted central catheter (PICC) is a long, thin, flexible tube (catheter) that is inserted into a vein in the upper arm. The PICC is inserted using a sterile technique by a specially trained nurse or physician. Only a trained clinical professional should remove it. Keep your PICC identification card with you at all times. Avoid blood pressure checks on the arm in which the PICC is placed. If cared for properly, a PICC can remain in place for several months. Having a PICC can also allow a person to go home from the hospital sooner. This information is not intended to replace advice given to you by your health care provider. Make sure you discuss any questions you have with your healthcare provider. Document Revised: 08/20/2019 Document Reviewed: 08/20/2019 Elsevier Patient Education  2022 [employer].

## 2020-12-22 ENCOUNTER — Encounter: Payer: Self-pay | Admitting: Hematology & Oncology

## 2020-12-22 ENCOUNTER — Encounter: Payer: Self-pay | Admitting: Family

## 2020-12-22 NOTE — Progress Notes (Signed)
Oncology Nurse Navigator Documentation  Oncology Nurse Navigator Flowsheets 12/21/2020  Abnormal Finding Date -  Confirmed Diagnosis Date -  Diagnosis Status -  Phase of Treatment -  Chemotherapy Pending- Reason: -  Chemotherapy Actual Start Date: -  Chemotherapy Expected End Date: -  Surgery Actual Start Date: -  Navigator Follow Up Date: 01/04/2021  Navigator Follow Up Reason: Follow-up Appointment;Chemotherapy  Navigator Location [facility]  Referral Date to RadOnc/MedOnc -  Navigator Encounter Type Appt/Treatment Plan Review;Treatment  Telephone -  Treatment Initiated Date -  Patient Visit Type MedOnc  Treatment Phase Active Tx  Barriers/Navigation Needs Coordination of Care;Education  Education -  Interventions None Required  Acuity Level 2-Minimal Needs (1-2 Barriers Identified)  Coordination of Care -  Education Method -  Support Groups/Services Friends and Family  Time Spent with Patient 15

## 2020-12-23 ENCOUNTER — Inpatient Hospital Stay: Payer: 59 | Attending: Hematology & Oncology

## 2020-12-23 ENCOUNTER — Other Ambulatory Visit: Payer: Self-pay

## 2020-12-23 VITALS — BP 131/66 | HR 99 | Temp 98.2°F | Resp 18

## 2020-12-23 DIAGNOSIS — Z5111 Encounter for antineoplastic chemotherapy: Secondary | ICD-10-CM | POA: Insufficient documentation

## 2020-12-23 DIAGNOSIS — C180 Malignant neoplasm of cecum: Secondary | ICD-10-CM | POA: Diagnosis present

## 2020-12-23 DIAGNOSIS — C787 Secondary malignant neoplasm of liver and intrahepatic bile duct: Secondary | ICD-10-CM | POA: Insufficient documentation

## 2020-12-23 DIAGNOSIS — R197 Diarrhea, unspecified: Secondary | ICD-10-CM | POA: Insufficient documentation

## 2020-12-23 DIAGNOSIS — Z452 Encounter for adjustment and management of vascular access device: Secondary | ICD-10-CM | POA: Insufficient documentation

## 2020-12-23 DIAGNOSIS — E669 Obesity, unspecified: Secondary | ICD-10-CM | POA: Diagnosis not present

## 2020-12-23 DIAGNOSIS — Z79899 Other long term (current) drug therapy: Secondary | ICD-10-CM | POA: Insufficient documentation

## 2020-12-23 MED ORDER — HEPARIN SOD (PORK) LOCK FLUSH 100 UNIT/ML IV SOLN
250.0000 [IU] | Freq: Once | INTRAVENOUS | Status: AC | PRN
  Administered 2020-12-23: 250 [IU]

## 2020-12-23 MED ORDER — SODIUM CHLORIDE 0.9% FLUSH
10.0000 mL | INTRAVENOUS | Status: DC | PRN
  Administered 2020-12-23: 10 mL

## 2020-12-23 NOTE — Patient Instructions (Signed)
PICC Home Care Guide A peripherally inserted central catheter (PICC) is a form of IV access that allows medicines and IV fluids to be quickly distributed throughout the body. The PICC is a long, thin, flexible tube (catheter) that is inserted into a vein in the upper arm. The catheter ends in a large vein in the chest (superior vena cava, or SVC). After the PICC is inserted, a chest X-ray may be done to make sure that it is in the correct place. A PICC may be placed for different reasons, such as: To give medicines and liquid nutrition. To give IV fluids and blood products. If there is trouble placing a peripheral intravenous (PIV) catheter. If taken care of properly, a PICC can remain in place for several months. Having a PICC can also allow a person to go home from the hospital sooner. Medicine and PICC care can be managed at home by a family member, caregiver, or home health care team. What are the risks? Generally, having a PICC is safe. However, problems may occur, including: A blood clot (thrombus) forming in or at the tip of the PICC. A blood clot forming in a vein (deep vein thrombosis) or traveling to the lung (pulmonary embolism). Inflammation of the vein (phlebitis) in which the PICC is placed. Infection. Central line associated blood stream infection (CLABSI) is a serious infection that often requires hospitalization. PICC movement (malposition). The PICC tip may move from its original position due to excessive physical activity, forceful coughing, sneezing, or vomiting. A break or cut in the PICC. It is important not to use scissors near the PICC. Nerve or tendon irritation or injury during PICC insertion. How to take care of your PICC Preventing problems You and any caregivers should wash your hands often with soap. Wash hands: Before touching the PICC line or the infusion device. Before changing a bandage (dressing). Flush the PICC as told by your health care provider. Let your  health care provider know right away if the PICC is hard to flush or does not flush. Do not use force to flush the PICC. Do not use a syringe that is less than 10 mL to flush the PICC. Avoid blood pressure checks on the arm in which the PICC is placed. Never pull or tug on the PICC. Do not take the PICC out yourself. Only a trained clinical professional should remove the PICC. Use clean and sterile supplies only. Keep the supplies in a dry place. Do not reuse needles, syringes, or any other supplies. Doing that can lead to infection. Keep pets and children away from your PICC line. Check the PICC insertion site every day for signs of infection. Check for: Leakage. Redness, swelling, or pain. Fluid or blood. Warmth. Pus or a bad smell. PICC dressing care Keep your PICC bandage (dressing) clean and dry to prevent infection. Do not take baths, swim, or use a hot tub until your health care provider approves. Ask your health care provider if you can take showers. You may only be allowed to take sponge baths for bathing. When you are allowed to shower: Ask your health care provider to teach you how to wrap the PICC line. Cover the PICC line with clear plastic wrap and tape to keep it dry while showering. Follow instructions from your health care provider about how to take care of your insertion site and dressing. Make sure you: Wash your hands with soap and water before you change your bandage (dressing). If soap and water are  not available, use hand sanitizer. Change your dressing as told by your health care provider. Leave stitches (sutures), skin glue, or adhesive strips in place. These skin closures may need to stay in place for 2 weeks or longer. If adhesive strip edges start to loosen and curl up, you may trim the loose edges. Do not remove adhesive strips completely unless your health care provider tells you to do that. Change your PICC dressing if it becomes loose or wet. General  instructions  Carry your PICC identification card or wear a medical alert bracelet at all times. Keep the tube clamped at all times, unless it is being used. Carry a smooth-edge clamp with you at all times to place on the tube if it breaks. Do not use scissors or sharp objects near the tube. You may bend your arm and move it freely. If your PICC is near or at the bend of your elbow, avoid activity with repeated motion at the elbow. Avoid lifting heavy objects as told by your health care provider. Keep all follow-up visits as told by your health care provider. This is important. Disposal of supplies Throw away any syringes in a disposal container that is meant for sharp items (sharps container). You can buy a sharps container from a pharmacy, or you can make one by using an empty hard plastic bottle with a cover. Place any used dressings or infusion bags into a plastic bag. Throw that bag in the trash. Contact a health care provider if: You have pain in your arm, ear, face, or teeth. You have a fever or chills. You have redness, swelling, or pain around the insertion site. You have fluid or blood coming from the insertion site. Your insertion site feels warm to the touch. You have pus or a bad smell coming from the insertion site. Your skin feels hard and raised around the insertion site. Get help right away if: Your PICC is accidentally pulled all the way out. If this happens, cover the insertion site with a bandage or gauze dressing. Do not throw the PICC away. Your health care provider will need to check it. Your PICC was tugged or pulled and has partially come out. Do not  push the PICC back in. You cannot flush the PICC, it is hard to flush, or the PICC leaks around the insertion site when it is flushed. You hear a "flushing" sound when the PICC is flushed. You feel your heart racing or skipping beats. There is a hole or tear in the PICC. You have swelling in the arm in which the PICC  was inserted. You have a red streak going up your arm from where the PICC was inserted. Summary A peripherally inserted central catheter (PICC) is a long, thin, flexible tube (catheter) that is inserted into a vein in the upper arm. The PICC is inserted using a sterile technique by a specially trained nurse or physician. Only a trained clinical professional should remove it. Keep your PICC identification card with you at all times. Avoid blood pressure checks on the arm in which the PICC is placed. If cared for properly, a PICC can remain in place for several months. Having a PICC can also allow a person to go home from the hospital sooner. This information is not intended to replace advice given to you by your health care provider. Make sure you discuss any questions you have with your health care provider. Document Revised: 08/16/2019 Document Reviewed: 08/20/2019 Elsevier Patient Education  2022  [employer].

## 2020-12-28 ENCOUNTER — Other Ambulatory Visit: Payer: Self-pay

## 2020-12-28 ENCOUNTER — Inpatient Hospital Stay: Payer: 59

## 2020-12-28 DIAGNOSIS — Z5111 Encounter for antineoplastic chemotherapy: Secondary | ICD-10-CM | POA: Diagnosis not present

## 2020-12-28 DIAGNOSIS — C180 Malignant neoplasm of cecum: Secondary | ICD-10-CM

## 2020-12-28 MED ORDER — HEPARIN SOD (PORK) LOCK FLUSH 100 UNIT/ML IV SOLN
500.0000 [IU] | Freq: Once | INTRAVENOUS | Status: AC
  Administered 2020-12-28: 500 [IU]

## 2020-12-28 MED ORDER — HEPARIN SOD (PORK) LOCK FLUSH 100 UNIT/ML IV SOLN
500.0000 [IU] | Freq: Once | INTRAVENOUS | Status: AC
  Administered 2020-12-28: 250 [IU]

## 2020-12-28 MED ORDER — SODIUM CHLORIDE 0.9% FLUSH
10.0000 mL | Freq: Once | INTRAVENOUS | Status: AC
  Administered 2020-12-28: 10 mL

## 2020-12-30 ENCOUNTER — Other Ambulatory Visit: Payer: Self-pay

## 2020-12-30 ENCOUNTER — Inpatient Hospital Stay: Payer: 59

## 2020-12-30 DIAGNOSIS — Z452 Encounter for adjustment and management of vascular access device: Secondary | ICD-10-CM

## 2020-12-30 DIAGNOSIS — Z5111 Encounter for antineoplastic chemotherapy: Secondary | ICD-10-CM | POA: Diagnosis not present

## 2020-12-30 MED ORDER — HEPARIN SOD (PORK) LOCK FLUSH 100 UNIT/ML IV SOLN
500.0000 [IU] | Freq: Once | INTRAVENOUS | Status: AC
  Administered 2020-12-30: 500 [IU] via INTRAVENOUS

## 2020-12-30 MED ORDER — SODIUM CHLORIDE 0.9% FLUSH
10.0000 mL | Freq: Once | INTRAVENOUS | Status: AC
  Administered 2020-12-30: 10 mL via INTRAVENOUS

## 2021-01-04 ENCOUNTER — Encounter: Payer: Self-pay | Admitting: Hematology & Oncology

## 2021-01-04 ENCOUNTER — Inpatient Hospital Stay (HOSPITAL_BASED_OUTPATIENT_CLINIC_OR_DEPARTMENT_OTHER): Payer: 59 | Admitting: Hematology & Oncology

## 2021-01-04 ENCOUNTER — Telehealth: Payer: Self-pay

## 2021-01-04 ENCOUNTER — Other Ambulatory Visit: Payer: Self-pay

## 2021-01-04 ENCOUNTER — Encounter: Payer: Self-pay | Admitting: *Deleted

## 2021-01-04 ENCOUNTER — Inpatient Hospital Stay: Payer: 59

## 2021-01-04 VITALS — BP 124/82 | HR 75 | Temp 98.3°F | Resp 18 | Ht 63.0 in | Wt 277.8 lb

## 2021-01-04 DIAGNOSIS — Z5111 Encounter for antineoplastic chemotherapy: Secondary | ICD-10-CM | POA: Diagnosis not present

## 2021-01-04 DIAGNOSIS — C180 Malignant neoplasm of cecum: Secondary | ICD-10-CM | POA: Diagnosis not present

## 2021-01-04 DIAGNOSIS — D509 Iron deficiency anemia, unspecified: Secondary | ICD-10-CM

## 2021-01-04 LAB — CBC WITH DIFFERENTIAL (CANCER CENTER ONLY)
Abs Immature Granulocytes: 0.02 10*3/uL (ref 0.00–0.07)
Basophils Absolute: 0 10*3/uL (ref 0.0–0.1)
Basophils Relative: 1 %
Eosinophils Absolute: 0 10*3/uL (ref 0.0–0.5)
Eosinophils Relative: 0 %
HCT: 34.1 % — ABNORMAL LOW (ref 36.0–46.0)
Hemoglobin: 11 g/dL — ABNORMAL LOW (ref 12.0–15.0)
Immature Granulocytes: 1 %
Lymphocytes Relative: 48 %
Lymphs Abs: 1.3 10*3/uL (ref 0.7–4.0)
MCH: 29.1 pg (ref 26.0–34.0)
MCHC: 32.3 g/dL (ref 30.0–36.0)
MCV: 90.2 fL (ref 80.0–100.0)
Monocytes Absolute: 0.5 10*3/uL (ref 0.1–1.0)
Monocytes Relative: 17 %
Neutro Abs: 0.9 10*3/uL — ABNORMAL LOW (ref 1.7–7.7)
Neutrophils Relative %: 33 %
Platelet Count: 227 10*3/uL (ref 150–400)
RBC: 3.78 MIL/uL — ABNORMAL LOW (ref 3.87–5.11)
RDW: 16.5 % — ABNORMAL HIGH (ref 11.5–15.5)
WBC Count: 2.8 10*3/uL — ABNORMAL LOW (ref 4.0–10.5)
nRBC: 0 % (ref 0.0–0.2)

## 2021-01-04 LAB — CMP (CANCER CENTER ONLY)
ALT: 28 U/L (ref 0–44)
AST: 21 U/L (ref 15–41)
Albumin: 3.3 g/dL — ABNORMAL LOW (ref 3.5–5.0)
Alkaline Phosphatase: 65 U/L (ref 38–126)
Anion gap: 9 (ref 5–15)
BUN: 10 mg/dL (ref 6–20)
CO2: 29 mmol/L (ref 22–32)
Calcium: 8.8 mg/dL — ABNORMAL LOW (ref 8.9–10.3)
Chloride: 104 mmol/L (ref 98–111)
Creatinine: 0.72 mg/dL (ref 0.44–1.00)
GFR, Estimated: >60 mL/min (ref >60)
Glucose, Bld: 112 mg/dL — ABNORMAL HIGH (ref 70–99)
Potassium: 3.2 mmol/L — ABNORMAL LOW (ref 3.5–5.1)
Sodium: 142 mmol/L (ref 135–145)
Total Bilirubin: 0.4 mg/dL (ref 0.3–1.2)
Total Protein: 5.6 g/dL — ABNORMAL LOW (ref 6.5–8.1)

## 2021-01-04 LAB — IRON AND TIBC
Iron: 69 ug/dL (ref 28–170)
Saturation Ratios: 28 % (ref 10.4–31.8)
TIBC: 244 ug/dL — ABNORMAL LOW (ref 250–450)
UIBC: 175 ug/dL

## 2021-01-04 LAB — LACTATE DEHYDROGENASE: LDH: 177 U/L (ref 98–192)

## 2021-01-04 LAB — RETICULOCYTES
Immature Retic Fract: 17.3 % — ABNORMAL HIGH (ref 2.3–15.9)
RBC.: 3.75 MIL/uL — ABNORMAL LOW (ref 3.87–5.11)
Retic Count, Absolute: 110.6 10*3/uL (ref 19.0–186.0)
Retic Ct Pct: 3 % (ref 0.4–3.1)

## 2021-01-04 LAB — FERRITIN: Ferritin: 176 ng/mL (ref 11–307)

## 2021-01-04 LAB — TOTAL PROTEIN, URINE DIPSTICK: Protein, ur: NEGATIVE mg/dL

## 2021-01-04 MED ORDER — SODIUM CHLORIDE 0.9 % IV SOLN
150.0000 mg | Freq: Once | INTRAVENOUS | Status: AC
  Administered 2021-01-04: 150 mg via INTRAVENOUS
  Filled 2021-01-04: qty 150

## 2021-01-04 MED ORDER — SODIUM CHLORIDE 0.9 % IV SOLN
2400.0000 mg/m2 | INTRAVENOUS | Status: DC
  Administered 2021-01-04: 5750 mg via INTRAVENOUS
  Filled 2021-01-04: qty 115

## 2021-01-04 MED ORDER — DEXTROSE 5 % IV SOLN: Freq: Once | INTRAVENOUS | Status: AC

## 2021-01-04 MED ORDER — LEUCOVORIN CALCIUM INJECTION 350 MG
400.0000 mg/m2 | Freq: Once | INTRAVENOUS | Status: AC
  Administered 2021-01-04: 956 mg via INTRAVENOUS
  Filled 2021-01-04: qty 47.8

## 2021-01-04 MED ORDER — OXALIPLATIN CHEMO INJECTION 100 MG/20ML
84.0000 mg/m2 | Freq: Once | INTRAVENOUS | Status: AC
  Administered 2021-01-04: 200 mg via INTRAVENOUS
  Filled 2021-01-04: qty 40

## 2021-01-04 MED ORDER — SODIUM CHLORIDE 0.9 % IV SOLN: Freq: Once | INTRAVENOUS | Status: DC

## 2021-01-04 MED ORDER — PALONOSETRON HCL INJECTION 0.25 MG/5ML
0.2500 mg | Freq: Once | INTRAVENOUS | Status: AC
  Administered 2021-01-04: 0.25 mg via INTRAVENOUS
  Filled 2021-01-04: qty 5

## 2021-01-04 MED ORDER — SODIUM CHLORIDE 0.9 % IV SOLN
10.0000 mg | Freq: Once | INTRAVENOUS | Status: AC
  Administered 2021-01-04: 10 mg via INTRAVENOUS
  Filled 2021-01-04: qty 10

## 2021-01-04 MED ORDER — ATROPINE SULFATE 1 MG/ML IJ SOLN: 0.5000 mg | Freq: Once | INTRAMUSCULAR | Status: DC | PRN

## 2021-01-04 NOTE — Telephone Encounter (Signed)
Appts made per 01/04/21 los, pt to gain updated sch at [facility]  Lauren Mcpherson 

## 2021-01-04 NOTE — Progress Notes (Signed)
MD reviewed cbc and cmet, no CPT11 ( Irrinoteacan) today ,ok to treat despite counts.

## 2021-01-04 NOTE — Progress Notes (Signed)
Hematology and Oncology Follow Up Visit  Lauren Mcpherson [id number] 1965-08-27 55 y.o. 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  So far, she is done pretty well with treatment.  She had 3 cycles of treatment so far.  Her last CEA was 7.5.  She has had a little bit of diarrhea.  She takes Imodium for this.  She has had no cough or shortness of breath.  She has had no rashes.  There is been no leg swelling.  She had a little bit of tingling in the hands and feet.  She has had no fever.  There is no headache.  She has had no mouth sores.  There is no bleeding.  Overall, I would say her performance status is probably ECOG 1.     Medications:  Current Outpatient Medications:    dexamethasone (DECADRON) 4 MG tablet, Take 2 tablets (8 mg total) by mouth daily. Start the day after chemotherapy for 3 days. Take with food., Disp: 8 tablet, Rfl: 5   loperamide (IMODIUM A-D) 2 MG tablet, Take 2 at onset of diarrhea, then 1 every 2hrs until 12hr without a BM. May take 2 tab every 4hrs at bedtime. If diarrhea recurs repeat., Disp: 100 tablet, Rfl: 1   ondansetron (ZOFRAN) 8 MG tablet, Take 1 tablet (8 mg total) by mouth 2 (two) times daily as needed. Start on day 3 after chemotherapy. (Patient not taking: Reported on 01/04/2021), Disp: 30 tablet, Rfl: 1   prochlorperazine (COMPAZINE) 10 MG tablet, Take 1 tablet (10 mg total) by mouth every 6 (six) hours as needed (Nausea or vomiting). (Patient not taking: No sig reported), Disp: 30 tablet, Rfl: 1 No current facility-administered medications for this visit.  Facility-Administered Medications Ordered in Other Visits:    heparin lock flush 100 unit/mL, 500 Units, Intracatheter, Once PRN, Lauren Mcpherson, Rudell Cobb, MD   sodium chloride flush (NS) 0.9 % injection  10 mL, 10 mL, Intracatheter, PRN, Marin Olp, Rudell Cobb, MD  Allergies: No Known Allergies  Past Medical History, Surgical history, Social history, and Family History were reviewed and updated.  Review of Systems: Review of Systems  Constitutional: Negative.   HENT:  Negative.    Eyes: Negative.   Respiratory: Negative.    Cardiovascular: Negative.   Gastrointestinal: Negative.   Endocrine: Negative.   Genitourinary: Negative.    Musculoskeletal: Negative.   Skin: Negative.   Neurological: Negative.   Hematological: Negative.   Psychiatric/Behavioral: Negative.     Physical Exam:  height is 5' 3"  (1.6 m) and weight is 277 lb 12.8 oz (126 kg). Her oral temperature is 98.3 F (36.8 C). Her blood pressure is 124/82 and her pulse is 75. Her respiration is 18 and oxygen saturation is 100%.   Wt Readings from Last 3 Encounters:  01/04/21 277 lb 12.8 oz (126 kg)  12/21/20 278 lb (126.1 kg)  12/07/20 283 lb 6.4 oz (128.5 kg)    Physical Exam Vitals reviewed.  HENT:     Head: Normocephalic and atraumatic.  Eyes:     Pupils: Pupils are equal, round, and reactive to light.  Cardiovascular:     Rate and Rhythm: Normal rate and regular rhythm.     Heart sounds: Normal heart sounds.  Pulmonary:     Effort: Pulmonary  effort is normal.     Breath sounds: Normal breath sounds.  Abdominal:     General: Bowel sounds are normal.     Palpations: Abdomen is soft.     Comments: Her abdominal exam is obese.  She has a healed laparoscopy scar in the midline.  There is no fluid wave.  There is no guarding or rebound tenderness.  I feel no palpable liver or spleen tip.  Musculoskeletal:        General: No tenderness or deformity. Normal range of motion.     Cervical back: Normal range of motion.  Lymphadenopathy:     Cervical: No cervical adenopathy.  Skin:    General: Skin is warm and dry.     Findings: No erythema or rash.  Neurological:     Mental Status: She is alert and oriented to  person, place, and time.  Psychiatric:        Behavior: Behavior normal.        Thought Content: Thought content normal.        Judgment: Judgment normal.     Lab Results  Component Value Date   WBC 2.8 (L) 01/04/2021   HGB 11.0 (L) 01/04/2021   HCT 34.1 (L) 01/04/2021   MCV 90.2 01/04/2021   PLT 227 01/04/2021     Chemistry      Component Value Date/Time   NA 142 01/04/2021 0827   K 3.2 (L) 01/04/2021 0827   CL 104 01/04/2021 0827   CO2 29 01/04/2021 0827   BUN 10 01/04/2021 0827   CREATININE 0.72 01/04/2021 0827      Component Value Date/Time   CALCIUM 8.8 (L) 01/04/2021 0827   ALKPHOS 65 01/04/2021 0827   AST 21 01/04/2021 0827   ALT 28 01/04/2021 0827   BILITOT 0.4 01/04/2021 0827      Impression and Plan: Lauren Mcpherson is a very nice 55 year old African-American female.  She has metastatic adenocarcinoma of the cecum.  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  After this fourth cycle, we will then plan for follow-up CT scan.  We probably will also do an MRI of the liver.  This gives [place] a better idea as to how things are going with treatment.  I have been holding the Avastin.  We will likely have to add that back in with the next cycles.  Her quality of life is doing great.  I am happy about this.  She has a son who is in college.  She wants to make sure that he gets through college.  His major is in Social Work.  I will give her next week off after this cycle.  Her white cell count is little bit on the lower side.  I will hold the irinotecan this cycle.    [name], MD 9/13/20229:08 AM

## 2021-01-04 NOTE — Patient Instructions (Signed)

## 2021-01-04 NOTE — Patient Instructions (Signed)
[facility]  Discharge Instructions: Thank you for choosing [facility] to provide your oncology and hematology care.   If you have a lab appointment with the [facility], please go directly to the [facility] and check in at the registration area.  Wear comfortable clothing and clothing appropriate for easy access to any Portacath or PICC line.   We strive to give you quality time with your provider. You may need to reschedule your appointment if you arrive late (15 or more minutes).  Arriving late affects you and other patients whose appointments are after yours.  Also, if you miss three or more appointments without notifying the office, you may be dismissed from the clinic at the provider's discretion.      For prescription refill requests, have your pharmacy contact our office and allow 72 hours for refills to be completed.    Today you received the following chemotherapy and/or immunotherapy agents Oxaliplatin, Leukovorin, 5FU      To help prevent nausea and vomiting after your treatment, we encourage you to take your nausea medication as directed.  BELOW ARE SYMPTOMS THAT SHOULD BE REPORTED IMMEDIATELY: *FEVER GREATER THAN 100.4 F (38 C) OR HIGHER *CHILLS OR SWEATING *NAUSEA AND VOMITING THAT IS NOT CONTROLLED WITH YOUR NAUSEA MEDICATION *UNUSUAL SHORTNESS OF BREATH *UNUSUAL BRUISING OR BLEEDING *URINARY PROBLEMS (pain or burning when urinating, or frequent urination) *BOWEL PROBLEMS (unusual diarrhea, constipation, pain near the anus) TENDERNESS IN MOUTH AND THROAT WITH OR WITHOUT PRESENCE OF ULCERS (sore throat, sores in mouth, or a toothache) UNUSUAL RASH, SWELLING OR PAIN  UNUSUAL VAGINAL DISCHARGE OR ITCHING   Items with * indicate a potential emergency and should be followed up as soon as possible or go to the Emergency Department if any problems should occur.  Please show the CHEMOTHERAPY ALERT CARD or IMMUNOTHERAPY ALERT CARD  at check-in to the Emergency Department and triage nurse. Should you have questions after your visit or need to cancel or reschedule your appointment, please contact [facility]  [phone] and follow the prompts.  Office hours are 8:00 a.m. to 4:30 p.m. Monday - Friday. Please note that voicemails left after 4:00 p.m. may not be returned until the following business day.  We are closed weekends and major holidays. You have access to a nurse at all times for urgent questions. Please call the main number to the clinic [phone] and follow the prompts.  For any non-urgent questions, you may also contact your provider using MyChart. We now offer e-Visits for anyone 7 and older to request care online for non-urgent symptoms. For details visit [email or web address].   Also download the MyChart app! Go to the app store, search "MyChart", open the app, select [facility], and log in with your MyChart username and password.  Due to Covid, a mask is required upon entering the hospital/clinic. If you do not have a mask, one will be given to you upon arrival. For doctor visits, patients may have 1 support person aged 89 or older with them. For treatment visits, patients cannot have anyone with them due to current Covid guidelines and our immunocompromised population.

## 2021-01-04 NOTE — Progress Notes (Signed)
Patient needs CT/MRI after this cycle and before next cycle. Appointments made for 01/22/2021.   Oncology Nurse Navigator Documentation  Oncology Nurse Navigator Flowsheets 01/04/2021  Abnormal Finding Date -  Confirmed Diagnosis Date -  Diagnosis Status -  Phase of Treatment -  Chemotherapy Pending- Reason: -  Chemotherapy Actual Start Date: -  Chemotherapy Expected End Date: -  Surgery Actual Start Date: -  Navigator Follow Up Date: 01/25/2021  Navigator Follow Up Reason: Scan Review  Navigator Location [facility]  Referral Date to RadOnc/MedOnc -  Navigator Encounter Type Appt/Treatment Plan Review  Telephone -  Treatment Initiated Date -  Patient Visit Type MedOnc  Treatment Phase Active Tx  Barriers/Navigation Needs Coordination of Care;Education  Education -  Interventions Coordination of Care  Acuity Level 2-Minimal Needs (1-2 Barriers Identified)  Coordination of Care Radiology  Education Method -  Support Groups/Services Friends and Family  Time Spent with Patient 30

## 2021-01-05 LAB — CEA (IN HOUSE-CHCC): CEA (CHCC-In House): 6.18 ng/mL — ABNORMAL HIGH (ref 0.00–5.00)

## 2021-01-06 ENCOUNTER — Other Ambulatory Visit: Payer: Self-pay

## 2021-01-06 ENCOUNTER — Inpatient Hospital Stay: Payer: 59

## 2021-01-06 VITALS — BP 124/76 | HR 76 | Temp 98.2°F | Resp 18

## 2021-01-06 DIAGNOSIS — C180 Malignant neoplasm of cecum: Secondary | ICD-10-CM

## 2021-01-06 DIAGNOSIS — Z5111 Encounter for antineoplastic chemotherapy: Secondary | ICD-10-CM | POA: Diagnosis not present

## 2021-01-06 MED ORDER — HEPARIN SOD (PORK) LOCK FLUSH 100 UNIT/ML IV SOLN
250.0000 [IU] | Freq: Once | INTRAVENOUS | Status: AC | PRN
  Administered 2021-01-06: 250 [IU]

## 2021-01-06 MED ORDER — SODIUM CHLORIDE 0.9% FLUSH
10.0000 mL | INTRAVENOUS | Status: DC | PRN
  Administered 2021-01-06: 10 mL

## 2021-01-06 NOTE — Patient Instructions (Signed)
PICC Home Care Guide A peripherally inserted central catheter (PICC) is a form of IV access that allows medicines and IV fluids to be quickly distributed throughout the body. The PICC is a long, thin, flexible tube (catheter) that is inserted into a vein in the upper arm. The catheter ends in a large vein in the chest (superior vena cava, or SVC). After the PICC is inserted, a chest X-ray may be done to make sure that it is in the correct place. A PICC may be placed for different reasons, such as: To give medicines and liquid nutrition. To give IV fluids and blood products. If there is trouble placing a peripheral intravenous (PIV) catheter. If taken care of properly, a PICC can remain in place for several months. Having a PICC can also allow a person to go home from the hospital sooner. Medicine and PICC care can be managed at home by a family member, caregiver, or home health care team. What are the risks? Generally, having a PICC is safe. However, problems may occur, including: A blood clot (thrombus) forming in or at the tip of the PICC. A blood clot forming in a vein (deep vein thrombosis) or traveling to the lung (pulmonary embolism). Inflammation of the vein (phlebitis) in which the PICC is placed. Infection. Central line associated blood stream infection (CLABSI) is a serious infection that often requires hospitalization. PICC movement (malposition). The PICC tip may move from its original position due to excessive physical activity, forceful coughing, sneezing, or vomiting. A break or cut in the PICC. It is important not to use scissors near the PICC. Nerve or tendon irritation or injury during PICC insertion. How to take care of your PICC Preventing problems You and any caregivers should wash your hands often with soap. Wash hands: Before touching the PICC line or the infusion device. Before changing a bandage (dressing). Flush the PICC as told by your health care provider. Let your  health care provider know right away if the PICC is hard to flush or does not flush. Do not use force to flush the PICC. Do not use a syringe that is less than 10 mL to flush the PICC. Avoid blood pressure checks on the arm in which the PICC is placed. Never pull or tug on the PICC. Do not take the PICC out yourself. Only a trained clinical professional should remove the PICC. Use clean and sterile supplies only. Keep the supplies in a dry place. Do not reuse needles, syringes, or any other supplies. Doing that can lead to infection. Keep pets and children away from your PICC line. Check the PICC insertion site every day for signs of infection. Check for: Leakage. Redness, swelling, or pain. Fluid or blood. Warmth. Pus or a bad smell. PICC dressing care Keep your PICC bandage (dressing) clean and dry to prevent infection. Do not take baths, swim, or use a hot tub until your health care provider approves. Ask your health care provider if you can take showers. You may only be allowed to take sponge baths for bathing. When you are allowed to shower: Ask your health care provider to teach you how to wrap the PICC line. Cover the PICC line with clear plastic wrap and tape to keep it dry while showering. Follow instructions from your health care provider about how to take care of your insertion site and dressing. Make sure you: Wash your hands with soap and water before you change your bandage (dressing). If soap and water are  not available, use hand sanitizer. Change your dressing as told by your health care provider. Leave stitches (sutures), skin glue, or adhesive strips in place. These skin closures may need to stay in place for 2 weeks or longer. If adhesive strip edges start to loosen and curl up, you may trim the loose edges. Do not remove adhesive strips completely unless your health care provider tells you to do that. Change your PICC dressing if it becomes loose or wet. General  instructions  Carry your PICC identification card or wear a medical alert bracelet at all times. Keep the tube clamped at all times, unless it is being used. Carry a smooth-edge clamp with you at all times to place on the tube if it breaks. Do not use scissors or sharp objects near the tube. You may bend your arm and move it freely. If your PICC is near or at the bend of your elbow, avoid activity with repeated motion at the elbow. Avoid lifting heavy objects as told by your health care provider. Keep all follow-up visits as told by your health care provider. This is important. Disposal of supplies Throw away any syringes in a disposal container that is meant for sharp items (sharps container). You can buy a sharps container from a pharmacy, or you can make one by using an empty hard plastic bottle with a cover. Place any used dressings or infusion bags into a plastic bag. Throw that bag in the trash. Contact a health care provider if: You have pain in your arm, ear, face, or teeth. You have a fever or chills. You have redness, swelling, or pain around the insertion site. You have fluid or blood coming from the insertion site. Your insertion site feels warm to the touch. You have pus or a bad smell coming from the insertion site. Your skin feels hard and raised around the insertion site. Get help right away if: Your PICC is accidentally pulled all the way out. If this happens, cover the insertion site with a bandage or gauze dressing. Do not throw the PICC away. Your health care provider will need to check it. Your PICC was tugged or pulled and has partially come out. Do not  push the PICC back in. You cannot flush the PICC, it is hard to flush, or the PICC leaks around the insertion site when it is flushed. You hear a "flushing" sound when the PICC is flushed. You feel your heart racing or skipping beats. There is a hole or tear in the PICC. You have swelling in the arm in which the PICC  was inserted. You have a red streak going up your arm from where the PICC was inserted. Summary A peripherally inserted central catheter (PICC) is a long, thin, flexible tube (catheter) that is inserted into a vein in the upper arm. The PICC is inserted using a sterile technique by a specially trained nurse or physician. Only a trained clinical professional should remove it. Keep your PICC identification card with you at all times. Avoid blood pressure checks on the arm in which the PICC is placed. If cared for properly, a PICC can remain in place for several months. Having a PICC can also allow a person to go home from the hospital sooner. This information is not intended to replace advice given to you by your health care provider. Make sure you discuss any questions you have with your health care provider. Document Revised: 08/16/2019 Document Reviewed: 08/20/2019 Elsevier Patient Education  2022  [employer].

## 2021-01-10 ENCOUNTER — Other Ambulatory Visit: Payer: Self-pay

## 2021-01-10 ENCOUNTER — Inpatient Hospital Stay: Payer: 59

## 2021-01-10 DIAGNOSIS — Z5111 Encounter for antineoplastic chemotherapy: Secondary | ICD-10-CM | POA: Diagnosis not present

## 2021-01-10 DIAGNOSIS — Z452 Encounter for adjustment and management of vascular access device: Secondary | ICD-10-CM

## 2021-01-10 MED ORDER — SODIUM CHLORIDE 0.9% FLUSH
10.0000 mL | Freq: Once | INTRAVENOUS | Status: AC
  Administered 2021-01-10: 10 mL via INTRAVENOUS

## 2021-01-10 MED ORDER — HEPARIN SOD (PORK) LOCK FLUSH 100 UNIT/ML IV SOLN
500.0000 [IU] | Freq: Once | INTRAVENOUS | Status: AC
  Administered 2021-01-10: 250 [IU] via INTRAVENOUS

## 2021-01-10 NOTE — Patient Instructions (Signed)
PICC Home Care Guide A peripherally inserted central catheter (PICC) is a form of IV access that allows medicines and IV fluids to be quickly distributed throughout the body. The PICC is a long, thin, flexible tube (catheter) that is inserted into a vein in the upper arm. The catheter ends in a large vein in the chest (superior vena cava, or SVC). After the PICC is inserted, a chest X-ray may be done to make sure that it is in the correct place. A PICC may be placed for different reasons, such as: To give medicines and liquid nutrition. To give IV fluids and blood products. If there is trouble placing a peripheral intravenous (PIV) catheter. If taken care of properly, a PICC can remain in place for several months. Having a PICC can also allow a person to go home from the hospital sooner. Medicine and PICC care can be managed at home by a family member, caregiver, or home health care team. What are the risks? Generally, having a PICC is safe. However, problems may occur, including: A blood clot (thrombus) forming in or at the tip of the PICC. A blood clot forming in a vein (deep vein thrombosis) or traveling to the lung (pulmonary embolism). Inflammation of the vein (phlebitis) in which the PICC is placed. Infection. Central line associated blood stream infection (CLABSI) is a serious infection that often requires hospitalization. PICC movement (malposition). The PICC tip may move from its original position due to excessive physical activity, forceful coughing, sneezing, or vomiting. A break or cut in the PICC. It is important not to use scissors near the PICC. Nerve or tendon irritation or injury during PICC insertion. How to take care of your PICC Preventing problems You and any caregivers should wash your hands often with soap. Wash hands: Before touching the PICC line or the infusion device. Before changing a bandage (dressing). Flush the PICC as told by your health care provider. Let your  health care provider know right away if the PICC is hard to flush or does not flush. Do not use force to flush the PICC. Do not use a syringe that is less than 10 mL to flush the PICC. Avoid blood pressure checks on the arm in which the PICC is placed. Never pull or tug on the PICC. Do not take the PICC out yourself. Only a trained clinical professional should remove the PICC. Use clean and sterile supplies only. Keep the supplies in a dry place. Do not reuse needles, syringes, or any other supplies. Doing that can lead to infection. Keep pets and children away from your PICC line. Check the PICC insertion site every day for signs of infection. Check for: Leakage. Redness, swelling, or pain. Fluid or blood. Warmth. Pus or a bad smell. PICC dressing care Keep your PICC bandage (dressing) clean and dry to prevent infection. Do not take baths, swim, or use a hot tub until your health care provider approves. Ask your health care provider if you can take showers. You may only be allowed to take sponge baths for bathing. When you are allowed to shower: Ask your health care provider to teach you how to wrap the PICC line. Cover the PICC line with clear plastic wrap and tape to keep it dry while showering. Follow instructions from your health care provider about how to take care of your insertion site and dressing. Make sure you: Wash your hands with soap and water before you change your bandage (dressing). If soap and water are  not available, use hand sanitizer. Change your dressing as told by your health care provider. Leave stitches (sutures), skin glue, or adhesive strips in place. These skin closures may need to stay in place for 2 weeks or longer. If adhesive strip edges start to loosen and curl up, you may trim the loose edges. Do not remove adhesive strips completely unless your health care provider tells you to do that. Change your PICC dressing if it becomes loose or wet. General  instructions  Carry your PICC identification card or wear a medical alert bracelet at all times. Keep the tube clamped at all times, unless it is being used. Carry a smooth-edge clamp with you at all times to place on the tube if it breaks. Do not use scissors or sharp objects near the tube. You may bend your arm and move it freely. If your PICC is near or at the bend of your elbow, avoid activity with repeated motion at the elbow. Avoid lifting heavy objects as told by your health care provider. Keep all follow-up visits as told by your health care provider. This is important. Disposal of supplies Throw away any syringes in a disposal container that is meant for sharp items (sharps container). You can buy a sharps container from a pharmacy, or you can make one by using an empty hard plastic bottle with a cover. Place any used dressings or infusion bags into a plastic bag. Throw that bag in the trash. Contact a health care provider if: You have pain in your arm, ear, face, or teeth. You have a fever or chills. You have redness, swelling, or pain around the insertion site. You have fluid or blood coming from the insertion site. Your insertion site feels warm to the touch. You have pus or a bad smell coming from the insertion site. Your skin feels hard and raised around the insertion site. Get help right away if: Your PICC is accidentally pulled all the way out. If this happens, cover the insertion site with a bandage or gauze dressing. Do not throw the PICC away. Your health care provider will need to check it. Your PICC was tugged or pulled and has partially come out. Do not  push the PICC back in. You cannot flush the PICC, it is hard to flush, or the PICC leaks around the insertion site when it is flushed. You hear a "flushing" sound when the PICC is flushed. You feel your heart racing or skipping beats. There is a hole or tear in the PICC. You have swelling in the arm in which the PICC  was inserted. You have a red streak going up your arm from where the PICC was inserted. Summary A peripherally inserted central catheter (PICC) is a long, thin, flexible tube (catheter) that is inserted into a vein in the upper arm. The PICC is inserted using a sterile technique by a specially trained nurse or physician. Only a trained clinical professional should remove it. Keep your PICC identification card with you at all times. Avoid blood pressure checks on the arm in which the PICC is placed. If cared for properly, a PICC can remain in place for several months. Having a PICC can also allow a person to go home from the hospital sooner. This information is not intended to replace advice given to you by your health care provider. Make sure you discuss any questions you have with your health care provider. Document Revised: 08/16/2019 Document Reviewed: 08/20/2019 Elsevier Patient Education  2022  [employer].

## 2021-01-12 ENCOUNTER — Other Ambulatory Visit: Payer: Self-pay

## 2021-01-12 ENCOUNTER — Inpatient Hospital Stay: Payer: 59

## 2021-01-12 DIAGNOSIS — Z5111 Encounter for antineoplastic chemotherapy: Secondary | ICD-10-CM | POA: Diagnosis not present

## 2021-01-12 DIAGNOSIS — Z452 Encounter for adjustment and management of vascular access device: Secondary | ICD-10-CM

## 2021-01-12 MED ORDER — HEPARIN SOD (PORK) LOCK FLUSH 100 UNIT/ML IV SOLN
250.0000 [IU] | Freq: Once | INTRAVENOUS | Status: AC
  Administered 2021-01-12: 250 [IU] via INTRAVENOUS

## 2021-01-12 MED ORDER — SODIUM CHLORIDE 0.9% FLUSH
10.0000 mL | Freq: Once | INTRAVENOUS | Status: AC
  Administered 2021-01-12: 10 mL via INTRAVENOUS

## 2021-01-12 NOTE — Patient Instructions (Signed)
PICC Home Care Guide A peripherally inserted central catheter (PICC) is a form of IV access that allows medicines and IV fluids to be quickly distributed throughout the body. The PICC is a long, thin, flexible tube (catheter) that is inserted into a vein in the upper arm. The catheter ends in a large vein in the chest (superior vena cava, or SVC). After the PICC is inserted, a chest X-ray may be done to make sure that it is in the correct place. A PICC may be placed for different reasons, such as: To give medicines and liquid nutrition. To give IV fluids and blood products. If there is trouble placing a peripheral intravenous (PIV) catheter. If taken care of properly, a PICC can remain in place for several months. Having a PICC can also allow a person to go home from the hospital sooner. Medicine and PICC care can be managed at home by a family member, caregiver, or home health care team. What are the risks? Generally, having a PICC is safe. However, problems may occur, including: A blood clot (thrombus) forming in or at the tip of the PICC. A blood clot forming in a vein (deep vein thrombosis) or traveling to the lung (pulmonary embolism). Inflammation of the vein (phlebitis) in which the PICC is placed. Infection. Central line associated blood stream infection (CLABSI) is a serious infection that often requires hospitalization. PICC movement (malposition). The PICC tip may move from its original position due to excessive physical activity, forceful coughing, sneezing, or vomiting. A break or cut in the PICC. It is important not to use scissors near the PICC. Nerve or tendon irritation or injury during PICC insertion. How to take care of your PICC Preventing problems You and any caregivers should wash your hands often with soap. Wash hands: Before touching the PICC line or the infusion device. Before changing a bandage (dressing). Flush the PICC as told by your health care provider. Let your  health care provider know right away if the PICC is hard to flush or does not flush. Do not use force to flush the PICC. Do not use a syringe that is less than 10 mL to flush the PICC. Avoid blood pressure checks on the arm in which the PICC is placed. Never pull or tug on the PICC. Do not take the PICC out yourself. Only a trained clinical professional should remove the PICC. Use clean and sterile supplies only. Keep the supplies in a dry place. Do not reuse needles, syringes, or any other supplies. Doing that can lead to infection. Keep pets and children away from your PICC line. Check the PICC insertion site every day for signs of infection. Check for: Leakage. Redness, swelling, or pain. Fluid or blood. Warmth. Pus or a bad smell. PICC dressing care Keep your PICC bandage (dressing) clean and dry to prevent infection. Do not take baths, swim, or use a hot tub until your health care provider approves. Ask your health care provider if you can take showers. You may only be allowed to take sponge baths for bathing. When you are allowed to shower: Ask your health care provider to teach you how to wrap the PICC line. Cover the PICC line with clear plastic wrap and tape to keep it dry while showering. Follow instructions from your health care provider about how to take care of your insertion site and dressing. Make sure you: Wash your hands with soap and water before you change your bandage (dressing). If soap and water are  not available, use hand sanitizer. Change your dressing as told by your health care provider. Leave stitches (sutures), skin glue, or adhesive strips in place. These skin closures may need to stay in place for 2 weeks or longer. If adhesive strip edges start to loosen and curl up, you may trim the loose edges. Do not remove adhesive strips completely unless your health care provider tells you to do that. Change your PICC dressing if it becomes loose or wet. General  instructions  Carry your PICC identification card or wear a medical alert bracelet at all times. Keep the tube clamped at all times, unless it is being used. Carry a smooth-edge clamp with you at all times to place on the tube if it breaks. Do not use scissors or sharp objects near the tube. You may bend your arm and move it freely. If your PICC is near or at the bend of your elbow, avoid activity with repeated motion at the elbow. Avoid lifting heavy objects as told by your health care provider. Keep all follow-up visits as told by your health care provider. This is important. Disposal of supplies Throw away any syringes in a disposal container that is meant for sharp items (sharps container). You can buy a sharps container from a pharmacy, or you can make one by using an empty hard plastic bottle with a cover. Place any used dressings or infusion bags into a plastic bag. Throw that bag in the trash. Contact a health care provider if: You have pain in your arm, ear, face, or teeth. You have a fever or chills. You have redness, swelling, or pain around the insertion site. You have fluid or blood coming from the insertion site. Your insertion site feels warm to the touch. You have pus or a bad smell coming from the insertion site. Your skin feels hard and raised around the insertion site. Get help right away if: Your PICC is accidentally pulled all the way out. If this happens, cover the insertion site with a bandage or gauze dressing. Do not throw the PICC away. Your health care provider will need to check it. Your PICC was tugged or pulled and has partially come out. Do not  push the PICC back in. You cannot flush the PICC, it is hard to flush, or the PICC leaks around the insertion site when it is flushed. You hear a "flushing" sound when the PICC is flushed. You feel your heart racing or skipping beats. There is a hole or tear in the PICC. You have swelling in the arm in which the PICC  was inserted. You have a red streak going up your arm from where the PICC was inserted. Summary A peripherally inserted central catheter (PICC) is a long, thin, flexible tube (catheter) that is inserted into a vein in the upper arm. The PICC is inserted using a sterile technique by a specially trained nurse or physician. Only a trained clinical professional should remove it. Keep your PICC identification card with you at all times. Avoid blood pressure checks on the arm in which the PICC is placed. If cared for properly, a PICC can remain in place for several months. Having a PICC can also allow a person to go home from the hospital sooner. This information is not intended to replace advice given to you by your health care provider. Make sure you discuss any questions you have with your health care provider. Document Revised: 08/16/2019 Document Reviewed: 08/20/2019 Elsevier Patient Education  2022  [employer].

## 2021-01-14 ENCOUNTER — Other Ambulatory Visit: Payer: Self-pay

## 2021-01-14 ENCOUNTER — Inpatient Hospital Stay: Payer: 59

## 2021-01-14 VITALS — BP 126/98 | HR 66 | Temp 98.4°F | Resp 18

## 2021-01-14 DIAGNOSIS — Z5111 Encounter for antineoplastic chemotherapy: Secondary | ICD-10-CM | POA: Diagnosis not present

## 2021-01-14 DIAGNOSIS — C180 Malignant neoplasm of cecum: Secondary | ICD-10-CM

## 2021-01-14 MED ORDER — SODIUM CHLORIDE 0.9% FLUSH
10.0000 mL | Freq: Once | INTRAVENOUS | Status: AC
  Administered 2021-01-14: 10 mL

## 2021-01-14 MED ORDER — HEPARIN SOD (PORK) LOCK FLUSH 100 UNIT/ML IV SOLN
500.0000 [IU] | Freq: Once | INTRAVENOUS | Status: AC
  Administered 2021-01-14: 500 [IU]

## 2021-01-17 ENCOUNTER — Inpatient Hospital Stay: Payer: 59

## 2021-01-17 ENCOUNTER — Other Ambulatory Visit: Payer: Self-pay

## 2021-01-17 DIAGNOSIS — Z5111 Encounter for antineoplastic chemotherapy: Secondary | ICD-10-CM | POA: Diagnosis not present

## 2021-01-17 DIAGNOSIS — Z452 Encounter for adjustment and management of vascular access device: Secondary | ICD-10-CM

## 2021-01-17 MED ORDER — HEPARIN SOD (PORK) LOCK FLUSH 100 UNIT/ML IV SOLN
250.0000 [IU] | Freq: Once | INTRAVENOUS | Status: AC
  Administered 2021-01-17: 250 [IU] via INTRAVENOUS

## 2021-01-17 MED ORDER — SODIUM CHLORIDE 0.9% FLUSH
10.0000 mL | Freq: Once | INTRAVENOUS | Status: AC
  Administered 2021-01-17: 10 mL via INTRAVENOUS

## 2021-01-17 NOTE — Patient Instructions (Signed)
PICC Home Care Guide A peripherally inserted central catheter (PICC) is a form of IV access that allows medicines and IV fluids to be quickly distributed throughout the body. The PICC is a long, thin, flexible tube (catheter) that is inserted into a vein in the upper arm. The catheter ends in a large vein in the chest (superior vena cava, or SVC). After the PICC is inserted, a chest X-ray may be done to make sure that it is in the correct place. A PICC may be placed for different reasons, such as: To give medicines and liquid nutrition. To give IV fluids and blood products. If there is trouble placing a peripheral intravenous (PIV) catheter. If taken care of properly, a PICC can remain in place for several months. Having a PICC can also allow a person to go home from the hospital sooner. Medicine and PICC care can be managed at home by a family member, caregiver, or home health care team. What are the risks? Generally, having a PICC is safe. However, problems may occur, including: A blood clot (thrombus) forming in or at the tip of the PICC. A blood clot forming in a vein (deep vein thrombosis) or traveling to the lung (pulmonary embolism). Inflammation of the vein (phlebitis) in which the PICC is placed. Infection. Central line associated blood stream infection (CLABSI) is a serious infection that often requires hospitalization. PICC movement (malposition). The PICC tip may move from its original position due to excessive physical activity, forceful coughing, sneezing, or vomiting. A break or cut in the PICC. It is important not to use scissors near the PICC. Nerve or tendon irritation or injury during PICC insertion. How to take care of your PICC Preventing problems You and any caregivers should wash your hands often with soap. Wash hands: Before touching the PICC line or the infusion device. Before changing a bandage (dressing). Flush the PICC as told by your health care provider. Let your  health care provider know right away if the PICC is hard to flush or does not flush. Do not use force to flush the PICC. Do not use a syringe that is less than 10 mL to flush the PICC. Avoid blood pressure checks on the arm in which the PICC is placed. Never pull or tug on the PICC. Do not take the PICC out yourself. Only a trained clinical professional should remove the PICC. Use clean and sterile supplies only. Keep the supplies in a dry place. Do not reuse needles, syringes, or any other supplies. Doing that can lead to infection. Keep pets and children away from your PICC line. Check the PICC insertion site every day for signs of infection. Check for: Leakage. Redness, swelling, or pain. Fluid or blood. Warmth. Pus or a bad smell. PICC dressing care Keep your PICC bandage (dressing) clean and dry to prevent infection. Do not take baths, swim, or use a hot tub until your health care provider approves. Ask your health care provider if you can take showers. You may only be allowed to take sponge baths for bathing. When you are allowed to shower: Ask your health care provider to teach you how to wrap the PICC line. Cover the PICC line with clear plastic wrap and tape to keep it dry while showering. Follow instructions from your health care provider about how to take care of your insertion site and dressing. Make sure you: Wash your hands with soap and water before you change your bandage (dressing). If soap and water are  not available, use hand sanitizer. Change your dressing as told by your health care provider. Leave stitches (sutures), skin glue, or adhesive strips in place. These skin closures may need to stay in place for 2 weeks or longer. If adhesive strip edges start to loosen and curl up, you may trim the loose edges. Do not remove adhesive strips completely unless your health care provider tells you to do that. Change your PICC dressing if it becomes loose or wet. General  instructions  Carry your PICC identification card or wear a medical alert bracelet at all times. Keep the tube clamped at all times, unless it is being used. Carry a smooth-edge clamp with you at all times to place on the tube if it breaks. Do not use scissors or sharp objects near the tube. You may bend your arm and move it freely. If your PICC is near or at the bend of your elbow, avoid activity with repeated motion at the elbow. Avoid lifting heavy objects as told by your health care provider. Keep all follow-up visits as told by your health care provider. This is important. Disposal of supplies Throw away any syringes in a disposal container that is meant for sharp items (sharps container). You can buy a sharps container from a pharmacy, or you can make one by using an empty hard plastic bottle with a cover. Place any used dressings or infusion bags into a plastic bag. Throw that bag in the trash. Contact a health care provider if: You have pain in your arm, ear, face, or teeth. You have a fever or chills. You have redness, swelling, or pain around the insertion site. You have fluid or blood coming from the insertion site. Your insertion site feels warm to the touch. You have pus or a bad smell coming from the insertion site. Your skin feels hard and raised around the insertion site. Get help right away if: Your PICC is accidentally pulled all the way out. If this happens, cover the insertion site with a bandage or gauze dressing. Do not throw the PICC away. Your health care provider will need to check it. Your PICC was tugged or pulled and has partially come out. Do not  push the PICC back in. You cannot flush the PICC, it is hard to flush, or the PICC leaks around the insertion site when it is flushed. You hear a "flushing" sound when the PICC is flushed. You feel your heart racing or skipping beats. There is a hole or tear in the PICC. You have swelling in the arm in which the PICC  was inserted. You have a red streak going up your arm from where the PICC was inserted. Summary A peripherally inserted central catheter (PICC) is a long, thin, flexible tube (catheter) that is inserted into a vein in the upper arm. The PICC is inserted using a sterile technique by a specially trained nurse or physician. Only a trained clinical professional should remove it. Keep your PICC identification card with you at all times. Avoid blood pressure checks on the arm in which the PICC is placed. If cared for properly, a PICC can remain in place for several months. Having a PICC can also allow a person to go home from the hospital sooner. This information is not intended to replace advice given to you by your health care provider. Make sure you discuss any questions you have with your health care provider. Document Revised: 08/16/2019 Document Reviewed: 08/20/2019 Elsevier Patient Education  2022  [employer].

## 2021-01-19 ENCOUNTER — Other Ambulatory Visit: Payer: Self-pay

## 2021-01-19 ENCOUNTER — Inpatient Hospital Stay: Payer: 59

## 2021-01-19 DIAGNOSIS — Z5111 Encounter for antineoplastic chemotherapy: Secondary | ICD-10-CM | POA: Diagnosis not present

## 2021-01-19 DIAGNOSIS — Z452 Encounter for adjustment and management of vascular access device: Secondary | ICD-10-CM

## 2021-01-19 MED ORDER — HEPARIN SOD (PORK) LOCK FLUSH 100 UNIT/ML IV SOLN
250.0000 [IU] | Freq: Once | INTRAVENOUS | Status: AC
  Administered 2021-01-19: 250 [IU] via INTRAVENOUS

## 2021-01-19 MED ORDER — SODIUM CHLORIDE 0.9% FLUSH
10.0000 mL | Freq: Once | INTRAVENOUS | Status: AC
  Administered 2021-01-19: 10 mL via INTRAVENOUS

## 2021-01-19 NOTE — Patient Instructions (Signed)
PICC Home Care Guide A peripherally inserted central catheter (PICC) is a form of IV access that allows medicines and IV fluids to be quickly distributed throughout the body. The PICC is a long, thin, flexible tube (catheter) that is inserted into a vein in the upper arm. The catheter ends in a large vein in the chest (superior vena cava, or SVC). After the PICC is inserted, a chest X-ray may be done to make sure that it is in the correct place. A PICC may be placed for different reasons, such as: To give medicines and liquid nutrition. To give IV fluids and blood products. If there is trouble placing a peripheral intravenous (PIV) catheter. If taken care of properly, a PICC can remain in place for several months. Having a PICC can also allow a person to go home from the hospital sooner. Medicine and PICC care can be managed at home by a family member, caregiver, or home health care team. What are the risks? Generally, having a PICC is safe. However, problems may occur, including: A blood clot (thrombus) forming in or at the tip of the PICC. A blood clot forming in a vein (deep vein thrombosis) or traveling to the lung (pulmonary embolism). Inflammation of the vein (phlebitis) in which the PICC is placed. Infection. Central line associated blood stream infection (CLABSI) is a serious infection that often requires hospitalization. PICC movement (malposition). The PICC tip may move from its original position due to excessive physical activity, forceful coughing, sneezing, or vomiting. A break or cut in the PICC. It is important not to use scissors near the PICC. Nerve or tendon irritation or injury during PICC insertion. How to take care of your PICC Preventing problems You and any caregivers should wash your hands often with soap. Wash hands: Before touching the PICC line or the infusion device. Before changing a bandage (dressing). Flush the PICC as told by your health care provider. Let your  health care provider know right away if the PICC is hard to flush or does not flush. Do not use force to flush the PICC. Do not use a syringe that is less than 10 mL to flush the PICC. Avoid blood pressure checks on the arm in which the PICC is placed. Never pull or tug on the PICC. Do not take the PICC out yourself. Only a trained clinical professional should remove the PICC. Use clean and sterile supplies only. Keep the supplies in a dry place. Do not reuse needles, syringes, or any other supplies. Doing that can lead to infection. Keep pets and children away from your PICC line. Check the PICC insertion site every day for signs of infection. Check for: Leakage. Redness, swelling, or pain. Fluid or blood. Warmth. Pus or a bad smell. PICC dressing care Keep your PICC bandage (dressing) clean and dry to prevent infection. Do not take baths, swim, or use a hot tub until your health care provider approves. Ask your health care provider if you can take showers. You may only be allowed to take sponge baths for bathing. When you are allowed to shower: Ask your health care provider to teach you how to wrap the PICC line. Cover the PICC line with clear plastic wrap and tape to keep it dry while showering. Follow instructions from your health care provider about how to take care of your insertion site and dressing. Make sure you: Wash your hands with soap and water before you change your bandage (dressing). If soap and water are  not available, use hand sanitizer. Change your dressing as told by your health care provider. Leave stitches (sutures), skin glue, or adhesive strips in place. These skin closures may need to stay in place for 2 weeks or longer. If adhesive strip edges start to loosen and curl up, you may trim the loose edges. Do not remove adhesive strips completely unless your health care provider tells you to do that. Change your PICC dressing if it becomes loose or wet. General  instructions  Carry your PICC identification card or wear a medical alert bracelet at all times. Keep the tube clamped at all times, unless it is being used. Carry a smooth-edge clamp with you at all times to place on the tube if it breaks. Do not use scissors or sharp objects near the tube. You may bend your arm and move it freely. If your PICC is near or at the bend of your elbow, avoid activity with repeated motion at the elbow. Avoid lifting heavy objects as told by your health care provider. Keep all follow-up visits as told by your health care provider. This is important. Disposal of supplies Throw away any syringes in a disposal container that is meant for sharp items (sharps container). You can buy a sharps container from a pharmacy, or you can make one by using an empty hard plastic bottle with a cover. Place any used dressings or infusion bags into a plastic bag. Throw that bag in the trash. Contact a health care provider if: You have pain in your arm, ear, face, or teeth. You have a fever or chills. You have redness, swelling, or pain around the insertion site. You have fluid or blood coming from the insertion site. Your insertion site feels warm to the touch. You have pus or a bad smell coming from the insertion site. Your skin feels hard and raised around the insertion site. Get help right away if: Your PICC is accidentally pulled all the way out. If this happens, cover the insertion site with a bandage or gauze dressing. Do not throw the PICC away. Your health care provider will need to check it. Your PICC was tugged or pulled and has partially come out. Do not  push the PICC back in. You cannot flush the PICC, it is hard to flush, or the PICC leaks around the insertion site when it is flushed. You hear a "flushing" sound when the PICC is flushed. You feel your heart racing or skipping beats. There is a hole or tear in the PICC. You have swelling in the arm in which the PICC  was inserted. You have a red streak going up your arm from where the PICC was inserted. Summary A peripherally inserted central catheter (PICC) is a long, thin, flexible tube (catheter) that is inserted into a vein in the upper arm. The PICC is inserted using a sterile technique by a specially trained nurse or physician. Only a trained clinical professional should remove it. Keep your PICC identification card with you at all times. Avoid blood pressure checks on the arm in which the PICC is placed. If cared for properly, a PICC can remain in place for several months. Having a PICC can also allow a person to go home from the hospital sooner. This information is not intended to replace advice given to you by your health care provider. Make sure you discuss any questions you have with your health care provider. Document Revised: 08/16/2019 Document Reviewed: 08/20/2019 Elsevier Patient Education  2022  [employer].

## 2021-01-21 ENCOUNTER — Inpatient Hospital Stay: Payer: 59

## 2021-01-21 ENCOUNTER — Other Ambulatory Visit: Payer: Self-pay

## 2021-01-21 VITALS — BP 122/86 | HR 99 | Temp 98.1°F | Resp 18

## 2021-01-21 DIAGNOSIS — C180 Malignant neoplasm of cecum: Secondary | ICD-10-CM

## 2021-01-21 DIAGNOSIS — Z5111 Encounter for antineoplastic chemotherapy: Secondary | ICD-10-CM | POA: Diagnosis not present

## 2021-01-21 LAB — CMP (CANCER CENTER ONLY)
ALT: 20 U/L (ref 0–44)
AST: 22 U/L (ref 15–41)
Albumin: 3.4 g/dL — ABNORMAL LOW (ref 3.5–5.0)
Alkaline Phosphatase: 74 U/L (ref 38–126)
Anion gap: 9 (ref 5–15)
BUN: 13 mg/dL (ref 6–20)
CO2: 28 mmol/L (ref 22–32)
Calcium: 9 mg/dL (ref 8.9–10.3)
Chloride: 106 mmol/L (ref 98–111)
Creatinine: 0.78 mg/dL (ref 0.44–1.00)
GFR, Estimated: >60 mL/min (ref >60)
Glucose, Bld: 112 mg/dL — ABNORMAL HIGH (ref 70–99)
Potassium: 3.7 mmol/L (ref 3.5–5.1)
Sodium: 143 mmol/L (ref 135–145)
Total Bilirubin: 0.4 mg/dL (ref 0.3–1.2)
Total Protein: 5.9 g/dL — ABNORMAL LOW (ref 6.5–8.1)

## 2021-01-21 LAB — CBC WITH DIFFERENTIAL (CANCER CENTER ONLY)
Abs Immature Granulocytes: 0 10*3/uL (ref 0.00–0.07)
Basophils Absolute: 0.1 10*3/uL (ref 0.0–0.1)
Basophils Relative: 2 %
Eosinophils Absolute: 0.1 10*3/uL (ref 0.0–0.5)
Eosinophils Relative: 2 %
HCT: 35.8 % — ABNORMAL LOW (ref 36.0–46.0)
Hemoglobin: 11.5 g/dL — ABNORMAL LOW (ref 12.0–15.0)
Lymphocytes Relative: 53 %
Lymphs Abs: 2 10*3/uL (ref 0.7–4.0)
MCH: 29.6 pg (ref 26.0–34.0)
MCHC: 32.1 g/dL (ref 30.0–36.0)
MCV: 92 fL (ref 80.0–100.0)
Monocytes Absolute: 0.3 10*3/uL (ref 0.1–1.0)
Monocytes Relative: 7 %
Neutro Abs: 1.4 10*3/uL — ABNORMAL LOW (ref 1.7–7.7)
Neutrophils Relative %: 36 %
Platelet Count: 288 10*3/uL (ref 150–400)
RBC: 3.89 MIL/uL (ref 3.87–5.11)
RDW: 18.4 % — ABNORMAL HIGH (ref 11.5–15.5)
WBC Count: 3.8 10*3/uL — ABNORMAL LOW (ref 4.0–10.5)
nRBC: 0 % (ref 0.0–0.2)

## 2021-01-21 LAB — TOTAL PROTEIN, URINE DIPSTICK: Protein, ur: <30 mg/dL — AB

## 2021-01-21 MED ORDER — SODIUM CHLORIDE 0.9% FLUSH
10.0000 mL | Freq: Once | INTRAVENOUS | Status: AC
  Administered 2021-01-21: 10 mL

## 2021-01-21 MED ORDER — HEPARIN SOD (PORK) LOCK FLUSH 100 UNIT/ML IV SOLN
250.0000 [IU] | Freq: Once | INTRAVENOUS | Status: AC
  Administered 2021-01-21: 250 [IU]

## 2021-01-21 NOTE — Patient Instructions (Signed)

## 2021-01-22 ENCOUNTER — Encounter (HOSPITAL_COMMUNITY): Payer: Self-pay | Admitting: Emergency Medicine

## 2021-01-22 ENCOUNTER — Ambulatory Visit (HOSPITAL_BASED_OUTPATIENT_CLINIC_OR_DEPARTMENT_OTHER)
Admission: RE | Admit: 2021-01-22 | Discharge: 2021-01-22 | Disposition: A | Discharge status: Home / Self Care | Payer: 59 | Source: Ambulatory Visit | Attending: Hematology & Oncology | Admitting: Hematology & Oncology

## 2021-01-22 ENCOUNTER — Telehealth: Payer: Self-pay | Admitting: Hematology

## 2021-01-22 ENCOUNTER — Emergency Department (HOSPITAL_COMMUNITY)
Admission: EM | Admit: 2021-01-22 | Discharge: 2021-01-22 | Disposition: A | Discharge status: Home / Self Care | Payer: 59 | Attending: Emergency Medicine | Admitting: Emergency Medicine

## 2021-01-22 ENCOUNTER — Other Ambulatory Visit: Payer: Self-pay

## 2021-01-22 DIAGNOSIS — C180 Malignant neoplasm of cecum: Secondary | ICD-10-CM

## 2021-01-22 DIAGNOSIS — M791 Myalgia, unspecified site: Secondary | ICD-10-CM | POA: Diagnosis not present

## 2021-01-22 DIAGNOSIS — Z85038 Personal history of other malignant neoplasm of large intestine: Secondary | ICD-10-CM | POA: Insufficient documentation

## 2021-01-22 DIAGNOSIS — I2609 Other pulmonary embolism with acute cor pulmonale: Secondary | ICD-10-CM | POA: Insufficient documentation

## 2021-01-22 DIAGNOSIS — Z87891 Personal history of nicotine dependence: Secondary | ICD-10-CM | POA: Diagnosis not present

## 2021-01-22 DIAGNOSIS — I2699 Other pulmonary embolism without acute cor pulmonale: Secondary | ICD-10-CM

## 2021-01-22 DIAGNOSIS — R609 Edema, unspecified: Secondary | ICD-10-CM | POA: Insufficient documentation

## 2021-01-22 LAB — CBC WITH DIFFERENTIAL/PLATELET
Abs Immature Granulocytes: 0.01 10*3/uL (ref 0.00–0.07)
Basophils Absolute: 0 10*3/uL (ref 0.0–0.1)
Basophils Relative: 1 %
Eosinophils Absolute: 0.1 10*3/uL (ref 0.0–0.5)
Eosinophils Relative: 1 %
HCT: 35.8 % — ABNORMAL LOW (ref 36.0–46.0)
Hemoglobin: 11.3 g/dL — ABNORMAL LOW (ref 12.0–15.0)
Immature Granulocytes: 0 %
Lymphocytes Relative: 45 %
Lymphs Abs: 2 10*3/uL (ref 0.7–4.0)
MCH: 29.7 pg (ref 26.0–34.0)
MCHC: 31.6 g/dL (ref 30.0–36.0)
MCV: 94 fL (ref 80.0–100.0)
Monocytes Absolute: 1 10*3/uL (ref 0.1–1.0)
Monocytes Relative: 22 %
Neutro Abs: 1.3 10*3/uL — ABNORMAL LOW (ref 1.7–7.7)
Neutrophils Relative %: 31 %
Platelets: 335 10*3/uL (ref 150–400)
RBC: 3.81 MIL/uL — ABNORMAL LOW (ref 3.87–5.11)
RDW: 19 % — ABNORMAL HIGH (ref 11.5–15.5)
WBC: 4.4 10*3/uL (ref 4.0–10.5)
nRBC: 0 % (ref 0.0–0.2)

## 2021-01-22 LAB — COMPREHENSIVE METABOLIC PANEL
ALT: 22 U/L (ref 0–44)
AST: 24 U/L (ref 15–41)
Albumin: 3.1 g/dL — ABNORMAL LOW (ref 3.5–5.0)
Alkaline Phosphatase: 65 U/L (ref 38–126)
Anion gap: 6 (ref 5–15)
BUN: 14 mg/dL (ref 6–20)
CO2: 26 mmol/L (ref 22–32)
Calcium: 8.9 mg/dL (ref 8.9–10.3)
Chloride: 110 mmol/L (ref 98–111)
Creatinine, Ser: 0.79 mg/dL (ref 0.44–1.00)
GFR, Estimated: >60 mL/min (ref >60)
Glucose, Bld: 95 mg/dL (ref 70–99)
Potassium: 3.5 mmol/L (ref 3.5–5.1)
Sodium: 142 mmol/L (ref 135–145)
Total Bilirubin: 0.5 mg/dL (ref 0.3–1.2)
Total Protein: 6.4 g/dL — ABNORMAL LOW (ref 6.5–8.1)

## 2021-01-22 LAB — TROPONIN I (HIGH SENSITIVITY): Troponin I (High Sensitivity): 12 ng/L (ref <18)

## 2021-01-22 LAB — BRAIN NATRIURETIC PEPTIDE: B Natriuretic Peptide: 121.5 pg/mL — ABNORMAL HIGH (ref 0.0–100.0)

## 2021-01-22 MED ORDER — HEPARIN SOD (PORK) LOCK FLUSH 100 UNIT/ML IV SOLN
500.0000 [IU] | Freq: Once | INTRAVENOUS | Status: AC
  Administered 2021-01-22: 500 [IU]
  Filled 2021-01-22: qty 5

## 2021-01-22 MED ORDER — RIVAROXABAN (XARELTO) VTE STARTER PACK (15 & 20 MG): ORAL_TABLET | ORAL | 0 refills | Status: DC

## 2021-01-22 MED ORDER — ENOXAPARIN SODIUM 150 MG/ML IJ SOSY
130.0000 mg | PREFILLED_SYRINGE | Freq: Once | INTRAMUSCULAR | Status: AC
  Administered 2021-01-22: 130 mg via SUBCUTANEOUS
  Filled 2021-01-22: qty 0.86

## 2021-01-22 MED ORDER — GADOBUTROL 1 MMOL/ML IV SOLN
10.0000 mL | Freq: Once | INTRAVENOUS | Status: AC | PRN
  Administered 2021-01-22: 10 mL via INTRAVENOUS

## 2021-01-22 MED ORDER — IOHEXOL 350 MG/ML SOLN
100.0000 mL | Freq: Once | INTRAVENOUS | Status: AC | PRN
  Administered 2021-01-22: 60 mL via INTRAVENOUS

## 2021-01-22 NOTE — ED Triage Notes (Signed)
Patient getting a CT scan to stage colon cancer today at [facility], got a call that she had a blood clot in her lungs and to come to the ED. Last chemo 3 weeks ago.

## 2021-01-22 NOTE — ED Provider Notes (Signed)
[facility] DEPT Provider Note   CSN: [id number] Arrival date & time: 01/22/21  1811     History No chief complaint on file.   Lauren Mcpherson is a 55 y.o. female.  HPI  55 year old female with a history of colon cancer stage IV on Avastin for chemotherapy presenting to the emergency department with concern for CT findings of pulmonary embolism earlier today.  The patient underwent routine restaging of her colon cancer with CT chest this afternoon and showed acute PE involving the right main pulmonary artery with no radiographic evidence of right heart strain.  She was advised to come to the emergency department for subsequent evaluation.  The patient currently denies any chest pain or shortness of breath.  She denies any dyspnea on exertion.  Over the last few days, she has endorsed right lower extremity calf pain with no significant unilateral swelling.  The pain is sharp and achy and worse with movement.  She denies any recent syncope.  She overall has no complaints at this time beyond calf discomfort.  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 10/16/2020   Morbid obesity with BMI of 50.0-59.9, adult ([place]) 10/16/2020   Hypokalemia 10/16/2020   Hypomagnesemia 10/16/2020   SBO (small bowel obstruction) ([place]) 10/10/2020   Hyponatremia 10/10/2020    Past Surgical History:  Procedure Laterality Date   BIOPSY  10/13/2020   Procedure: BIOPSY;  Surgeon: Ronald Lobo, MD;  Location: [facility] ENDOSCOPY;  Service: Endoscopy;;   CHOLECYSTECTOMY     COLONOSCOPY WITH PROPOFOL N/A 10/13/2020   Procedure: COLONOSCOPY WITH PROPOFOL;  Surgeon: Ronald Lobo, MD;  Location: [facility] ENDOSCOPY;  Service: Endoscopy;  Laterality: N/A;   IR CV LINE  INJECTION  11/24/2020   IR IMAGING GUIDED PORT INSERTION  11/08/2020   IR REMOVAL TUN ACCESS W/ PORT W/O FL MOD SED  12/01/2020   LAPAROSCOPIC RIGHT COLECTOMY Right 10/15/2020   Procedure: LAPAROSCOPIC RIGHT COLECTOMY, LAPAROSCOPIC LIVER BIOPSY, LAPAROSCOPIC TAP BLOCK;  Surgeon: Greer Pickerel, MD;  Location: [facility] ORS;  Service: General;  Laterality: Right;  90 MINUTES   POLYPECTOMY  10/13/2020   Procedure: POLYPECTOMY;  Surgeon: Ronald Lobo, MD;  Location: [facility] ENDOSCOPY;  Service: Endoscopy;;   TONSILLECTOMY       OB History   No obstetric history on file.     No family history on file.  Social History   Tobacco Use   Smoking status: Former    Packs/day: 0.50    Years: 20.00    Pack years: 10.00    Types: Cigarettes    Quit date: 2009    Years since quitting: 13.7   Smokeless tobacco: Never  Vaping Use   Vaping Use: Never used  Substance Use Topics   Alcohol use: Yes    Comment: occ   Drug use: No    Home Medications Prior to Admission medications   Medication Sig Start Date End Date Taking? Authorizing Provider  dexamethasone (DECADRON) 6 MG tablet Take 6 mg by mouth See admin instructions. Take 6 mg by mouth daily for 3 days, starting the day after chemotherapy. Take with food.   Yes [provider]  loperamide (IMODIUM A-D) 2 MG tablet Take 2 at onset of diarrhea, then 1 every 2hrs until 12hr without  a BM. May take 2 tab every 4hrs at bedtime. If diarrhea recurs repeat. Patient taking differently: Take 2-4 mg by mouth See admin instructions. Take 4 mg by mouth at onset of diarrhea, then 2 mg every two hours until it has been 12 hours without a bowel movement. May take 4 mg by mouth every four hours at bedtime. Repeat, if diarrhea recurs. 11/02/20  Yes Ennever, Rudell Cobb, MD  ondansetron (ZOFRAN) 8 MG tablet Take 1 tablet (8 mg total) by mouth 2 (two) times daily as needed. Start on day 3 after chemotherapy. Patient taking differently: Take 8 mg by mouth See admin  instructions. Take 8 mg by mouth two times a day as needed for nausea or vomiting- start on day 3 after chemotherapy 11/02/20  Yes Ennever, Rudell Cobb, MD  prochlorperazine (COMPAZINE) 10 MG tablet Take 1 tablet (10 mg total) by mouth every 6 (six) hours as needed (Nausea or vomiting). Patient taking differently: Take 10 mg by mouth every 6 (six) hours as needed for vomiting or nausea. 11/02/20  Yes Ennever, Rudell Cobb, MD  RIVAROXABAN [employer]) VTE STARTER PACK (15 & 20 MG) Follow package directions: Take one 15mg  tablet by mouth twice a day. On day 22, switch to one 20mg  tablet once a day. Take with food. 01/22/21  Yes Regan Lemming, MD  dexamethasone (DECADRON) 4 MG tablet Take 2 tablets (8 mg total) by mouth daily. Start the day after chemotherapy for 3 days. Take with food. Patient not taking: Reported on 01/22/2021 11/02/20   [name], MD    Allergies    Patient has no known allergies.  Review of Systems   Review of Systems  Constitutional:  Negative for chills and fever.  HENT:  Negative for ear pain and sore throat.   Eyes:  Negative for pain and visual disturbance.  Respiratory:  Negative for cough and shortness of breath.   Cardiovascular:  Negative for chest pain and palpitations.  Gastrointestinal:  Negative for abdominal pain and vomiting.  Genitourinary:  Negative for dysuria and hematuria.  Musculoskeletal:  Positive for myalgias. Negative for arthralgias and back pain.  Skin:  Negative for color change and rash.  Neurological:  Negative for seizures and syncope.  All other systems reviewed and are negative.  Physical Exam Updated Vital Signs BP 133/90   Pulse 89   Temp 97.8 F (36.6 C) (Oral)   Resp (!) 0   Ht 5\' 3"  (1.6 m)   Wt 126 kg   SpO2 98%   BMI 49.21 kg/m   Physical Exam Vitals and nursing note reviewed.  Constitutional:      General: She is not in acute distress.    Appearance: She is well-developed.  HENT:     Head: Normocephalic and atraumatic.   Eyes:     Conjunctiva/sclera: Conjunctivae normal.     Pupils: Pupils are equal, round, and reactive to light.  Cardiovascular:     Rate and Rhythm: Normal rate and regular rhythm.     Heart sounds: No murmur heard. Pulmonary:     Effort: Pulmonary effort is normal. No respiratory distress.     Breath sounds: Normal breath sounds.  Abdominal:     General: There is no distension.     Palpations: Abdomen is soft.     Tenderness: There is no abdominal tenderness. There is no guarding.  Musculoskeletal:        General: Tenderness present. No deformity or signs of injury.     Cervical  back: Normal range of motion and neck supple.     Right lower leg: Edema present.     Comments: Mild tenderness of the right calf, trace pitting edema of the right lower extremity compared to the left  Skin:    General: Skin is warm and dry.     Findings: No lesion or rash.  Neurological:     General: No focal deficit present.     Mental Status: She is alert. Mental status is at baseline.    ED Results / Procedures / Treatments   Labs (all labs ordered are listed, but only abnormal results are displayed) Labs Reviewed  CBC WITH DIFFERENTIAL/PLATELET - Abnormal; Notable for the following components:      Result Value   RBC 3.81 (*)    Hemoglobin 11.3 (*)    HCT 35.8 (*)    RDW 19.0 (*)    Neutro Abs 1.3 (*)    All other components within normal limits  COMPREHENSIVE METABOLIC PANEL - Abnormal; Notable for the following components:   Total Protein 6.4 (*)    Albumin 3.1 (*)    All other components within normal limits  BRAIN NATRIURETIC PEPTIDE  TROPONIN I (HIGH SENSITIVITY)    EKG EKG Interpretation  Date/Time:  Saturday January 22 2021 20:06:14 EDT Ventricular Rate:  87 PR Interval:  152 QRS Duration: 89 QT Interval:  400 QTC Calculation: 482 R Axis:   52 Text Interpretation: Sinus rhythm Borderline low voltage, extremity leads Abnormal T, consider ischemia, anterior leads No prior  EKGs for comparison Confirmed by Regan Lemming ([id number]) on 01/22/2021 8:17:10 PM  Radiology CT Chest W Contrast  Result Date: 01/22/2021 CLINICAL DATA:  Follow-up metastatic colon carcinoma. EXAM: CT CHEST WITH CONTRAST TECHNIQUE: Multidetector CT imaging of the chest was performed during intravenous contrast administration. CONTRAST:  55mL OMNIPAQUE IOHEXOL 350 MG/ML SOLN COMPARISON:  10/14/2020 FINDINGS: Cardiovascular: Pulmonary embolism is seen in the distal right pulmonary artery as well as the right upper middle, and lower lobar pulmonary artery branches. No CT evidence of right heart strain. Mediastinum/Nodes: No masses or pathologically enlarged lymph nodes identified. Lungs/Pleura: 4 mm pulmonary nodule in the posterior right lower lobe remains stable. No new or enlarging pulmonary nodules or masses are identified. No evidence of pulmonary infiltrate or pleural effusion. Upper Abdomen:  Unremarkable. Musculoskeletal:  No suspicious bone lesions. IMPRESSION: Positive for pulmonary embolism in distal right pulmonary artery and right upper, middle, and lower lobar branches. No CT evidence of right heart strain. Stable 4 mm right lower lobe pulmonary nodule, which is nonspecific. Recommend continued follow-up by CT in 6 months. Critical Value/emergent results were called by telephone at the time of interpretation on 01/22/2021 at 5:21 pm to provider Dr. Burr Medico, who verbally acknowledged these results. Electronically Signed   By: Marlaine Hind M.D.   On: 01/22/2021 17:36    Procedures Procedures   Medications Ordered in ED Medications  enoxaparin (LOVENOX) injection 130 mg (130 mg Subcutaneous Given 01/22/21 2128)    ED Course  I have reviewed the triage vital signs and the nursing notes.  Pertinent labs & imaging results that were available during my care of the patient were reviewed by me and considered in my medical decision making (see chart for details).    MDM Rules/Calculators/A&P                            55 year old female with medical history significant for stage IV  colon cancer on Avastin for chemotherapy presenting to the emergency department with concern for new pulmonary embolism in the right main pulmonary artery without evidence of right heart strain.  On arrival, the patient was afebrile, not tachycardic or tachypneic, saturating 100% on room air.  She denies any complaints at this time beyond right calf discomfort.  Suspect the patient likely has a right lower extremity DVT in addition to her acute PE.  She is currently asymptomatic with stable vitals.  Her CT revealed no evidence of right heart strain.  She is normotensive and not hypoxic.  Suspect likely not massive PE.  Her EKG was with T wave inversions in the precordial leads with no prior EKGs for comparison.  She is without chest pain or shortness of breath and remained so in the ED.  A single troponin was negative and given the patient's lack of symptoms I do not think delta troponin is necessary.  She has remained hemodynamically stable, not tachycardic or tachypneic, saturating well on room air.  Overall feel that the patient is stable for outpatient management of her pulmonary embolism.  Patient is comfortable with this plan and plans to follow-up with her oncologist.  The patient was provided a treatment dose Lovenox injection in the ED and Xarelto starter pack provided with a plan to start in the morning. Return precautions provided in the event of worsening symptoms.  Final Clinical Impression(s) / ED Diagnoses Final diagnoses:  Acute pulmonary embolism, unspecified pulmonary embolism type, unspecified whether acute cor pulmonale present [facility])    Rx / DC Orders ED Discharge Orders          Ordered    RIVAROXABAN (XARELTO) VTE STARTER PACK (15 & 20 MG)        01/22/21 1939             Regan Lemming, MD 01/22/21 2242

## 2021-01-22 NOTE — Telephone Encounter (Signed)
I received a phone call from radiology regarding her CT chest findings.  Patient underwent a CT chest for routine restaging for her colon cancer this afternoon, it showed acute PE involving right main pulmonary artery, no radiographic evidence of right heart strain.  Patient is on chemotherapy including Avastin for stage IV colon cancer.  I called patient, she denies any chest pain or dyspnea.  I relayed her CT chest findings, and recommend her to go to [facility] emergency room as soon as possible, she agrees to call her husband and go to emergency room now.  I will follow-up if she gets admitted. If she does not need hospital admission, Lovenox subcu would be preferred for her anticoagulation, but if she is not comfortable with injections at home, okay to use Xarelto or Eliquis. Will hold Avastin in future.   Truitt Merle  01/22/2021

## 2021-01-22 NOTE — Discharge Instructions (Addendum)
Please follow-up with your oncologist regarding your cancer therapies. We are starting you on anticoagulation for your lung blood clot. Your Lovenox shot will last for 12 hours. You will need to start taking Xarelto tomorrow morning for treatment of your blood clot. A prescription has been sent to your pharmacy.

## 2021-01-22 NOTE — ED Provider Notes (Signed)
Emergency Medicine Provider Triage Evaluation Note  Lauren Mcpherson , a 55 y.o. female  was evaluated in triage.  Pt complains of abnormal chest CT.  Had chest CT for screening.  No chest pain, shortness of breath, palpitations, dizziness, lightheadedness.  No cough.  Chest CT showed pulmonary embolus.  For 2 days patient has had right calf pain.  Not on blood thinner.  On chemo.  Review of Systems  Positive: R leg pain Negative: Sob, cp, cough  Physical Exam  BP (!) 157/98   Pulse 98   Temp 97.8 F (36.6 C) (Oral)   Ht 5\' 3"  (1.6 m)   Wt 126 kg   SpO2 100%   BMI 49.21 kg/m  Gen:   Awake, no distress   Resp:  Normal effort MSK:   Moves extremities without difficulty.  Mild TTP of the right calf   Medical Decision Making  Medically screening exam initiated at 7:00 PM.  Appropriate orders placed.  Lauren Mcpherson was informed that the remainder of the evaluation will be completed by another provider, this initial triage assessment does not replace that evaluation, and the importance of remaining in the ED until their evaluation is complete.  labs   Lauren Heidelberg, PA-C 01/22/21 1901    Lauren Lemming, MD 01/23/21 [phone]

## 2021-01-24 ENCOUNTER — Telehealth: Payer: Self-pay | Admitting: *Deleted

## 2021-01-24 NOTE — Telephone Encounter (Addendum)
-----   Message from [name], MD sent at 01/24/2021  4:28 PM EDT ----- Called patient to let her know  there are less tumors in the liver!!!  Yeah!!  Left message on personal cell phone

## 2021-01-25 ENCOUNTER — Encounter: Payer: Self-pay | Admitting: *Deleted

## 2021-01-25 NOTE — Progress Notes (Signed)
Oncology Nurse Navigator Documentation  Oncology Nurse Navigator Flowsheets 01/25/2021  Abnormal Finding Date -  Confirmed Diagnosis Date -  Diagnosis Status -  Phase of Treatment -  Chemotherapy Pending- Reason: -  Chemotherapy Actual Start Date: -  Chemotherapy Expected End Date: -  Surgery Actual Start Date: -  Navigator Follow Up Date: 01/26/2021  Navigator Follow Up Reason: Follow-up Appointment;Chemotherapy  Navigator Location [facility]  Referral Date to RadOnc/MedOnc -  Navigator Encounter Type Scan Review  Telephone -  Treatment Initiated Date -  Patient Visit Type MedOnc  Treatment Phase Active Tx  Barriers/Navigation Needs Coordination of Care;Education  Education -  Interventions None Required  Acuity Level 2-Minimal Needs (1-2 Barriers Identified)  Coordination of Care -  Education Method -  Support Groups/Services Friends and Family  Time Spent with Patient 15

## 2021-01-26 ENCOUNTER — Inpatient Hospital Stay: Payer: 59 | Attending: Hematology & Oncology

## 2021-01-26 ENCOUNTER — Inpatient Hospital Stay (HOSPITAL_BASED_OUTPATIENT_CLINIC_OR_DEPARTMENT_OTHER): Payer: 59 | Admitting: Hematology & Oncology

## 2021-01-26 ENCOUNTER — Other Ambulatory Visit: Payer: 59

## 2021-01-26 ENCOUNTER — Inpatient Hospital Stay: Payer: 59

## 2021-01-26 ENCOUNTER — Encounter: Payer: Self-pay | Admitting: Hematology & Oncology

## 2021-01-26 ENCOUNTER — Encounter: Payer: Self-pay | Admitting: *Deleted

## 2021-01-26 ENCOUNTER — Other Ambulatory Visit: Payer: Self-pay

## 2021-01-26 VITALS — BP 134/86 | HR 87 | Temp 98.1°F | Resp 19 | Wt 278.1 lb

## 2021-01-26 DIAGNOSIS — Z5111 Encounter for antineoplastic chemotherapy: Secondary | ICD-10-CM | POA: Diagnosis not present

## 2021-01-26 DIAGNOSIS — C180 Malignant neoplasm of cecum: Secondary | ICD-10-CM | POA: Diagnosis present

## 2021-01-26 DIAGNOSIS — Z452 Encounter for adjustment and management of vascular access device: Secondary | ICD-10-CM | POA: Insufficient documentation

## 2021-01-26 DIAGNOSIS — I2699 Other pulmonary embolism without acute cor pulmonale: Secondary | ICD-10-CM | POA: Insufficient documentation

## 2021-01-26 DIAGNOSIS — Z7901 Long term (current) use of anticoagulants: Secondary | ICD-10-CM | POA: Diagnosis not present

## 2021-01-26 DIAGNOSIS — C787 Secondary malignant neoplasm of liver and intrahepatic bile duct: Secondary | ICD-10-CM | POA: Diagnosis not present

## 2021-01-26 DIAGNOSIS — I259 Chronic ischemic heart disease, unspecified: Secondary | ICD-10-CM | POA: Diagnosis not present

## 2021-01-26 DIAGNOSIS — I2690 Septic pulmonary embolism without acute cor pulmonale: Secondary | ICD-10-CM

## 2021-01-26 LAB — CMP (CANCER CENTER ONLY)
ALT: 18 U/L (ref 0–44)
AST: 24 U/L (ref 15–41)
Albumin: 3.3 g/dL — ABNORMAL LOW (ref 3.5–5.0)
Alkaline Phosphatase: 63 U/L (ref 38–126)
Anion gap: 9 (ref 5–15)
BUN: 11 mg/dL (ref 6–20)
CO2: 28 mmol/L (ref 22–32)
Calcium: 9 mg/dL (ref 8.9–10.3)
Chloride: 105 mmol/L (ref 98–111)
Creatinine: 0.73 mg/dL (ref 0.44–1.00)
GFR, Estimated: >60 mL/min (ref >60)
Glucose, Bld: 127 mg/dL — ABNORMAL HIGH (ref 70–99)
Potassium: 3.3 mmol/L — ABNORMAL LOW (ref 3.5–5.1)
Sodium: 142 mmol/L (ref 135–145)
Total Bilirubin: 0.3 mg/dL (ref 0.3–1.2)
Total Protein: 5.9 g/dL — ABNORMAL LOW (ref 6.5–8.1)

## 2021-01-26 LAB — CBC WITH DIFFERENTIAL (CANCER CENTER ONLY)
Abs Immature Granulocytes: 0.02 10*3/uL (ref 0.00–0.07)
Basophils Absolute: 0 10*3/uL (ref 0.0–0.1)
Basophils Relative: 1 %
Eosinophils Absolute: 0.1 10*3/uL (ref 0.0–0.5)
Eosinophils Relative: 1 %
HCT: 36 % (ref 36.0–46.0)
Hemoglobin: 11.7 g/dL — ABNORMAL LOW (ref 12.0–15.0)
Immature Granulocytes: 0 %
Lymphocytes Relative: 37 %
Lymphs Abs: 2.1 10*3/uL (ref 0.7–4.0)
MCH: 30.2 pg (ref 26.0–34.0)
MCHC: 32.5 g/dL (ref 30.0–36.0)
MCV: 92.8 fL (ref 80.0–100.0)
Monocytes Absolute: 0.6 10*3/uL (ref 0.1–1.0)
Monocytes Relative: 11 %
Neutro Abs: 2.9 10*3/uL (ref 1.7–7.7)
Neutrophils Relative %: 50 %
Platelet Count: 389 10*3/uL (ref 150–400)
RBC: 3.88 MIL/uL (ref 3.87–5.11)
RDW: 18.7 % — ABNORMAL HIGH (ref 11.5–15.5)
WBC Count: 5.7 10*3/uL (ref 4.0–10.5)
nRBC: 0 % (ref 0.0–0.2)

## 2021-01-26 LAB — CEA (IN HOUSE-CHCC): CEA (CHCC-In House): 4.77 ng/mL (ref 0.00–5.00)

## 2021-01-26 LAB — LACTATE DEHYDROGENASE: LDH: 222 U/L — ABNORMAL HIGH (ref 98–192)

## 2021-01-26 LAB — TOTAL PROTEIN, URINE DIPSTICK: Protein, ur: NEGATIVE mg/dL

## 2021-01-26 MED ORDER — SODIUM CHLORIDE 0.9 % IV SOLN
150.0000 mg | Freq: Once | INTRAVENOUS | Status: AC
  Administered 2021-01-26: 150 mg via INTRAVENOUS
  Filled 2021-01-26: qty 150

## 2021-01-26 MED ORDER — LEUCOVORIN CALCIUM INJECTION 350 MG
400.0000 mg/m2 | Freq: Once | INTRAVENOUS | Status: AC
  Administered 2021-01-26 (×2): 956 mg via INTRAVENOUS
  Filled 2021-01-26: qty 47.8

## 2021-01-26 MED ORDER — OXALIPLATIN CHEMO INJECTION 100 MG/20ML
84.0000 mg/m2 | Freq: Once | INTRAVENOUS | Status: AC
  Administered 2021-01-26: 200 mg via INTRAVENOUS
  Filled 2021-01-26: qty 40

## 2021-01-26 MED ORDER — DEXTROSE 5 % IV SOLN: Freq: Once | INTRAVENOUS | Status: AC

## 2021-01-26 MED ORDER — SODIUM CHLORIDE 0.9 % IV SOLN
165.0000 mg/m2 | Freq: Once | INTRAVENOUS | Status: AC
  Administered 2021-01-26: 400 mg via INTRAVENOUS
  Filled 2021-01-26: qty 5

## 2021-01-26 MED ORDER — ATROPINE SULFATE 1 MG/ML IJ SOLN
0.5000 mg | Freq: Once | INTRAMUSCULAR | Status: AC | PRN
  Administered 2021-01-26: 0.5 mg via INTRAVENOUS
  Filled 2021-01-26: qty 1

## 2021-01-26 MED ORDER — SODIUM CHLORIDE 0.9 % IV SOLN: Freq: Once | INTRAVENOUS | Status: DC

## 2021-01-26 MED ORDER — SODIUM CHLORIDE 0.9 % IV SOLN
2400.0000 mg/m2 | INTRAVENOUS | Status: DC
  Administered 2021-01-26: 5750 mg via INTRAVENOUS
  Filled 2021-01-26: qty 115

## 2021-01-26 MED ORDER — PALONOSETRON HCL INJECTION 0.25 MG/5ML
0.2500 mg | Freq: Once | INTRAVENOUS | Status: AC
  Administered 2021-01-26: 0.25 mg via INTRAVENOUS
  Filled 2021-01-26: qty 5

## 2021-01-26 MED ORDER — SODIUM CHLORIDE 0.9 % IV SOLN
10.0000 mg | Freq: Once | INTRAVENOUS | Status: AC
  Administered 2021-01-26: 10 mg via INTRAVENOUS
  Filled 2021-01-26: qty 10

## 2021-01-26 NOTE — Patient Instructions (Signed)
PICC Home Care Guide A peripherally inserted central catheter (PICC) is a form of IV access that allows medicines and IV fluids to be quickly distributed throughout the body. The PICC is a long, thin, flexible tube (catheter) that is inserted into a vein in the upper arm. The catheter ends in a large vein in the chest (superior vena cava, or SVC). After the PICC is inserted, a chest X-ray may be done to make sure that it is in the correct place. A PICC may be placed for different reasons, such as: To give medicines and liquid nutrition. To give IV fluids and blood products. If there is trouble placing a peripheral intravenous (PIV) catheter. If taken care of properly, a PICC can remain in place for several months. Having a PICC can also allow a person to go home from the hospital sooner. Medicine and PICC care can be managed at home by a family member, caregiver, or home health care team. What are the risks? Generally, having a PICC is safe. However, problems may occur, including: A blood clot (thrombus) forming in or at the tip of the PICC. A blood clot forming in a vein (deep vein thrombosis) or traveling to the lung (pulmonary embolism). Inflammation of the vein (phlebitis) in which the PICC is placed. Infection. Central line associated blood stream infection (CLABSI) is a serious infection that often requires hospitalization. PICC movement (malposition). The PICC tip may move from its original position due to excessive physical activity, forceful coughing, sneezing, or vomiting. A break or cut in the PICC. It is important not to use scissors near the PICC. Nerve or tendon irritation or injury during PICC insertion. How to take care of your PICC Preventing problems You and any caregivers should wash your hands often with soap. Wash hands: Before touching the PICC line or the infusion device. Before changing a bandage (dressing). Flush the PICC as told by your health care provider. Let your  health care provider know right away if the PICC is hard to flush or does not flush. Do not use force to flush the PICC. Do not use a syringe that is less than 10 mL to flush the PICC. Avoid blood pressure checks on the arm in which the PICC is placed. Never pull or tug on the PICC. Do not take the PICC out yourself. Only a trained clinical professional should remove the PICC. Use clean and sterile supplies only. Keep the supplies in a dry place. Do not reuse needles, syringes, or any other supplies. Doing that can lead to infection. Keep pets and children away from your PICC line. Check the PICC insertion site every day for signs of infection. Check for: Leakage. Redness, swelling, or pain. Fluid or blood. Warmth. Pus or a bad smell. PICC dressing care Keep your PICC bandage (dressing) clean and dry to prevent infection. Do not take baths, swim, or use a hot tub until your health care provider approves. Ask your health care provider if you can take showers. You may only be allowed to take sponge baths for bathing. When you are allowed to shower: Ask your health care provider to teach you how to wrap the PICC line. Cover the PICC line with clear plastic wrap and tape to keep it dry while showering. Follow instructions from your health care provider about how to take care of your insertion site and dressing. Make sure you: Wash your hands with soap and water before you change your bandage (dressing). If soap and water are  not available, use hand sanitizer. Change your dressing as told by your health care provider. Leave stitches (sutures), skin glue, or adhesive strips in place. These skin closures may need to stay in place for 2 weeks or longer. If adhesive strip edges start to loosen and curl up, you may trim the loose edges. Do not remove adhesive strips completely unless your health care provider tells you to do that. Change your PICC dressing if it becomes loose or wet. General  instructions  Carry your PICC identification card or wear a medical alert bracelet at all times. Keep the tube clamped at all times, unless it is being used. Carry a smooth-edge clamp with you at all times to place on the tube if it breaks. Do not use scissors or sharp objects near the tube. You may bend your arm and move it freely. If your PICC is near or at the bend of your elbow, avoid activity with repeated motion at the elbow. Avoid lifting heavy objects as told by your health care provider. Keep all follow-up visits as told by your health care provider. This is important. Disposal of supplies Throw away any syringes in a disposal container that is meant for sharp items (sharps container). You can buy a sharps container from a pharmacy, or you can make one by using an empty hard plastic bottle with a cover. Place any used dressings or infusion bags into a plastic bag. Throw that bag in the trash. Contact a health care provider if: You have pain in your arm, ear, face, or teeth. You have a fever or chills. You have redness, swelling, or pain around the insertion site. You have fluid or blood coming from the insertion site. Your insertion site feels warm to the touch. You have pus or a bad smell coming from the insertion site. Your skin feels hard and raised around the insertion site. Get help right away if: Your PICC is accidentally pulled all the way out. If this happens, cover the insertion site with a bandage or gauze dressing. Do not throw the PICC away. Your health care provider will need to check it. Your PICC was tugged or pulled and has partially come out. Do not  push the PICC back in. You cannot flush the PICC, it is hard to flush, or the PICC leaks around the insertion site when it is flushed. You hear a "flushing" sound when the PICC is flushed. You feel your heart racing or skipping beats. There is a hole or tear in the PICC. You have swelling in the arm in which the PICC  was inserted. You have a red streak going up your arm from where the PICC was inserted. Summary A peripherally inserted central catheter (PICC) is a long, thin, flexible tube (catheter) that is inserted into a vein in the upper arm. The PICC is inserted using a sterile technique by a specially trained nurse or physician. Only a trained clinical professional should remove it. Keep your PICC identification card with you at all times. Avoid blood pressure checks on the arm in which the PICC is placed. If cared for properly, a PICC can remain in place for several months. Having a PICC can also allow a person to go home from the hospital sooner. This information is not intended to replace advice given to you by your health care provider. Make sure you discuss any questions you have with your health care provider. Document Revised: 08/16/2019 Document Reviewed: 08/20/2019 Elsevier Patient Education  2022  [employer].

## 2021-01-26 NOTE — Progress Notes (Signed)
Hematology and Oncology Follow Up Visit  Lauren Mcpherson [id number] April 20, 1966 55 y.o. 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 daily-start on 01/22/2021     Interim History:  Lauren Mcpherson is back for follow-up.  The good news that she is responded very nicely to treatment.  She had MRI and CT scan done over the weekend.  The MRI of the liver showed that she had a very nice response with decrease in number and size of hepatic metastasis.  Unfortunately, she had a CT scan done of the chest.  This did show pulmonary emboli.  I am little surprised by this.  She had no problems with chest pain.  There is no cough or shortness of breath.  She had no hemoptysis.  We have held the Avastin recently.  We will get have to DC the Avastin.  No ultrasound was done of her legs.,  I am not sure she has a blood clot in her legs.  We probably need to check for her thromboembolic disease.  Her last CEA was down to 6.2.  She has had a very nice decline in the CEA.  She has had no change in bowel or bladder habits.  There is been no bleeding.  She has had no rashes.  There is been no cough or shortness of breath.  She has had no headache.  Overall, I would say her performance status is ECOG 1.    Medications:  Current Outpatient Medications:    dexamethasone (DECADRON) 6 MG tablet, Take 6 mg by mouth See admin instructions. Take 6 mg by mouth daily for 3 days, starting the day after chemotherapy. Take with food., Disp: , Rfl:    loperamide (IMODIUM A-D) 2 MG tablet, Take 2 at onset of diarrhea, then 1 every 2hrs until 12hr without a BM. May take 2 tab every 4hrs at bedtime. If diarrhea recurs repeat. (Patient taking differently: Take 2-4 mg by mouth See admin instructions. Take 4 mg by mouth at onset of  diarrhea, then 2 mg every two hours until it has been 12 hours without a bowel movement. May take 4 mg by mouth every four hours at bedtime. Repeat, if diarrhea recurs.), Disp: 100 tablet, Rfl: 1   ondansetron (ZOFRAN) 8 MG tablet, Take 1 tablet (8 mg total) by mouth 2 (two) times daily as needed. Start on day 3 after chemotherapy. (Patient taking differently: Take 8 mg by mouth See admin instructions. Take 8 mg by mouth two times a day as needed for nausea or vomiting- start on day 3 after chemotherapy), Disp: 30 tablet, Rfl: 1   prochlorperazine (COMPAZINE) 10 MG tablet, Take 1 tablet (10 mg total) by mouth every 6 (six) hours as needed (Nausea or vomiting). (Patient taking differently: Take 10 mg by mouth every 6 (six) hours as needed for vomiting or nausea.), Disp: 30 tablet, Rfl: 1   RIVAROXABAN (XARELTO) VTE STARTER PACK (15 & 20 MG), Follow package directions: Take one 74m tablet by mouth twice a day. On day 22, switch to one 265mtablet once a day. Take with food., Disp: 51 each, Rfl: 0 No current facility-administered medications for this visit.  Facility-Administered Medications Ordered in Other Visits:    heparin lock flush 100 unit/mL, 500 Units, Intracatheter, Once PRN, EnMarin OlpPeRudell CobbMD  sodium chloride flush (NS) 0.9 % injection 10 mL, 10 mL, Intracatheter, PRN, Marin Olp, Rudell Cobb, MD  Allergies: No Known Allergies  Past Medical History, Surgical history, Social history, and Family History were reviewed and updated.  Review of Systems: Review of Systems  Constitutional: Negative.   HENT:  Negative.    Eyes: Negative.   Respiratory: Negative.    Cardiovascular: Negative.   Gastrointestinal: Negative.   Endocrine: Negative.   Genitourinary: Negative.    Musculoskeletal: Negative.   Skin: Negative.   Neurological: Negative.   Hematological: Negative.   Psychiatric/Behavioral: Negative.     Physical Exam:  weight is 278 lb 1.3 oz (126.1 kg). Her oral temperature is 98.1  F (36.7 C). Her blood pressure is 134/86 and her pulse is 87. Her respiration is 19 and oxygen saturation is 100%.   Wt Readings from Last 3 Encounters:  01/26/21 278 lb 1.3 oz (126.1 kg)  01/22/21 277 lb 12.5 oz (126 kg)  01/04/21 277 lb 12.8 oz (126 kg)    Physical Exam Vitals reviewed.  HENT:     Head: Normocephalic and atraumatic.  Eyes:     Pupils: Pupils are equal, round, and reactive to light.  Cardiovascular:     Rate and Rhythm: Normal rate and regular rhythm.     Heart sounds: Normal heart sounds.  Pulmonary:     Effort: Pulmonary effort is normal.     Breath sounds: Normal breath sounds.  Abdominal:     General: Bowel sounds are normal.     Palpations: Abdomen is soft.     Comments: Her abdominal exam is obese.  She has a healed laparoscopy scar in the midline.  There is no fluid wave.  There is no guarding or rebound tenderness.  I feel no palpable liver or spleen tip.  Musculoskeletal:        General: No tenderness or deformity. Normal range of motion.     Cervical back: Normal range of motion.  Lymphadenopathy:     Cervical: No cervical adenopathy.  Skin:    General: Skin is warm and dry.     Findings: No erythema or rash.  Neurological:     Mental Status: She is alert and oriented to person, place, and time.  Psychiatric:        Behavior: Behavior normal.        Thought Content: Thought content normal.        Judgment: Judgment normal.     Lab Results  Component Value Date   WBC 5.7 01/26/2021   HGB 11.7 (L) 01/26/2021   HCT 36.0 01/26/2021   MCV 92.8 01/26/2021   PLT 389 01/26/2021     Chemistry      Component Value Date/Time   NA 142 01/22/2021 2118   K 3.5 01/22/2021 2118   CL 110 01/22/2021 2118   CO2 26 01/22/2021 2118   BUN 14 01/22/2021 2118   CREATININE 0.79 01/22/2021 2118   CREATININE 0.78 01/21/2021 0921      Component Value Date/Time   CALCIUM 8.9 01/22/2021 2118   ALKPHOS 65 01/22/2021 2118   AST 24 01/22/2021 2118   AST 22  01/21/2021 0921   ALT 22 01/22/2021 2118   ALT 20 01/21/2021 0921   BILITOT 0.5 01/22/2021 2118   BILITOT 0.4 01/21/2021 6256      Impression and Plan: Lauren Mcpherson is a very nice 55 year old African-American female.  She has metastatic adenocarcinoma of the cecum.  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  Again, we might have to DC the Avastin.  I am not sure if this contributed to her having the thromboembolic disease.  I realize that having the malignant metastatic colon cancer is definitely a primary reason.  She will be on Xarelto for the long-term.  Do think it be worthwhile getting a ultrasound of her legs to see what is going on.  I wonder if we need to get an actual CT angiogram of the chest so we can see where the pulmonary emboli are and how extensive they are.  We will proceed with her fifth cycle of treatment.  We will plan to get her back in another couple weeks for her sixth cycle.  She says that a niece is getting married on November 22.  Are going to have to rearrange her schedule to make sure that she is not feeling poorly for the wedding.   [name], MD 10/5/20229:42 AM

## 2021-01-26 NOTE — Progress Notes (Signed)
Oncology Nurse Navigator Documentation  Oncology Nurse Navigator Flowsheets 01/26/2021  Abnormal Finding Date -  Confirmed Diagnosis Date -  Diagnosis Status -  Phase of Treatment -  Chemotherapy Pending- Reason: -  Chemotherapy Actual Start Date: -  Chemotherapy Expected End Date: -  Surgery Actual Start Date: -  Navigator Follow Up Date: -  Navigator Follow Up Reason: -  Navigation Complete Date: 01/26/2021  Post Navigation: Continue to Follow Patient? No  Reason Not Navigating Patient: Patient On Maintenance Chemotherapy  Navigator Location [facility]  Referral Date to RadOnc/MedOnc -  Navigator Encounter Type Appt/Treatment Plan Review  Telephone -  Treatment Initiated Date -  Patient Visit Type MedOnc  Treatment Phase Active Tx  Barriers/Navigation Needs No Barriers At This Time  Education -  Interventions None Required  Acuity Level 1-No Barriers  Coordination of Care -  Education Method -  Support Groups/Services Friends and Family  Time Spent with Patient 15

## 2021-01-26 NOTE — Patient Instructions (Signed)
[facility]  Discharge Instructions: Thank you for choosing [facility] to provide your oncology and hematology care.   If you have a lab appointment with the [facility], please go directly to the [facility] and check in at the registration area.  Wear comfortable clothing and clothing appropriate for easy access to any Portacath or PICC line.   We strive to give you quality time with your provider. You may need to reschedule your appointment if you arrive late (15 or more minutes).  Arriving late affects you and other patients whose appointments are after yours.  Also, if you miss three or more appointments without notifying the office, you may be dismissed from the clinic at the provider's discretion.      For prescription refill requests, have your pharmacy contact our office and allow 72 hours for refills to be completed.    Today you received the following chemotherapy and/or immunotherapy agents FOLFOX      To help prevent nausea and vomiting after your treatment, we encourage you to take your nausea medication as directed.  BELOW ARE SYMPTOMS THAT SHOULD BE REPORTED IMMEDIATELY: *FEVER GREATER THAN 100.4 F (38 C) OR HIGHER *CHILLS OR SWEATING *NAUSEA AND VOMITING THAT IS NOT CONTROLLED WITH YOUR NAUSEA MEDICATION *UNUSUAL SHORTNESS OF BREATH *UNUSUAL BRUISING OR BLEEDING *URINARY PROBLEMS (pain or burning when urinating, or frequent urination) *BOWEL PROBLEMS (unusual diarrhea, constipation, pain near the anus) TENDERNESS IN MOUTH AND THROAT WITH OR WITHOUT PRESENCE OF ULCERS (sore throat, sores in mouth, or a toothache) UNUSUAL RASH, SWELLING OR PAIN  UNUSUAL VAGINAL DISCHARGE OR ITCHING   Items with * indicate a potential emergency and should be followed up as soon as possible or go to the Emergency Department if any problems should occur.  Please show the CHEMOTHERAPY ALERT CARD or IMMUNOTHERAPY ALERT CARD at check-in to the  Emergency Department and triage nurse. Should you have questions after your visit or need to cancel or reschedule your appointment, please contact [facility]  [phone] and follow the prompts.  Office hours are 8:00 a.m. to 4:30 p.m. Monday - Friday. Please note that voicemails left after 4:00 p.m. may not be returned until the following business day.  We are closed weekends and major holidays. You have access to a nurse at all times for urgent questions. Please call the main number to the clinic [phone] and follow the prompts.  For any non-urgent questions, you may also contact your provider using MyChart. We now offer e-Visits for anyone 11 and older to request care online for non-urgent symptoms. For details visit [email or web address].   Also download the MyChart app! Go to the app store, search "MyChart", open the app, select [facility], and log in with your MyChart username and password.  Due to Covid, a mask is required upon entering the hospital/clinic. If you do not have a mask, one will be given to you upon arrival. For doctor visits, patients may have 1 support person aged 31 or older with them. For treatment visits, patients cannot have anyone with them due to current Covid guidelines and our immunocompromised population.

## 2021-01-28 ENCOUNTER — Inpatient Hospital Stay: Payer: 59

## 2021-01-28 ENCOUNTER — Other Ambulatory Visit: Payer: Self-pay

## 2021-01-28 VITALS — BP 141/91 | HR 95 | Temp 97.8°F | Resp 19

## 2021-01-28 DIAGNOSIS — Z5111 Encounter for antineoplastic chemotherapy: Secondary | ICD-10-CM | POA: Diagnosis not present

## 2021-01-28 DIAGNOSIS — C180 Malignant neoplasm of cecum: Secondary | ICD-10-CM

## 2021-01-28 MED ORDER — SODIUM CHLORIDE 0.9% FLUSH: 10.0000 mL | INTRAVENOUS | Status: DC | PRN

## 2021-01-28 MED ORDER — SODIUM CHLORIDE 0.9% FLUSH
3.0000 mL | INTRAVENOUS | Status: DC | PRN
  Administered 2021-01-28: 3 mL

## 2021-01-28 MED ORDER — HEPARIN SOD (PORK) LOCK FLUSH 100 UNIT/ML IV SOLN
250.0000 [IU] | Freq: Once | INTRAVENOUS | Status: AC | PRN
  Administered 2021-01-28: 250 [IU]

## 2021-01-28 MED ORDER — HEPARIN SOD (PORK) LOCK FLUSH 100 UNIT/ML IV SOLN: 500.0000 [IU] | Freq: Once | INTRAVENOUS | Status: DC | PRN

## 2021-01-28 NOTE — Patient Instructions (Signed)
Tunneled Central Venous Catheter Flushing Guide It is important to flush your tunneled central venous catheter each time you use it, both before and after you use it. Flushing your catheter will help prevent it from clogging. What are the risks? Risks may include: Infection. Air getting into the catheter and bloodstream. Supplies needed: A clean pair of gloves. A disinfecting wipe. Use an alcohol wipe, chlorhexidine wipe, or iodine wipe as told by your health care provider. A 10 mL syringe that has been prefilled with saline solution. An empty 10 mL syringe, if a substance called heparin was injected into your catheter. How to flush your catheter When you flush your catheter, make sure you follow any specific instructions from your health care provider or the manufacturer. These are general guidelines. Flushing your catheter before use If there is heparin in your catheter: Wash your hands with soap and water. Put on gloves. Scrub the injection cap for a minimum of 15 seconds with a disinfecting wipe. Unclamp the catheter. Attach the empty syringe to the injection cap. Pull the syringe plunger back and withdraw 10 mL of blood. Place the syringe into an appropriate waste container. Scrub the injection cap for 15 seconds with a disinfecting wipe. Attach the prefilled syringe to the injection cap. Flush the catheter by pushing the plunger forward until all the liquid from the syringe is in the catheter. Remove the syringe from the injection cap. Clamp the catheter. If there is no heparin in your catheter: Wash your hands with soap and water. Put on gloves. Scrub the injection cap for 15 seconds with a disinfecting wipe. Unclamp the catheter. Attach the prefilled syringe to the injection cap. Flush the catheter by pushing the plunger forward until 5 mL of the liquid from the syringe is in the catheter. Pull back on the syringe until you see blood in the catheter. If you have been asked  to collect any blood, follow your health care provider's instructions. Otherwise, flush the catheter with the rest of the solution from the syringe. Remove the syringe from the injection cap. Clamp the catheter.  Flushing your catheter after use Wash your hands with soap and water. Put on gloves. Scrub the injection cap for 15 seconds with a disinfecting wipe. Unclamp the catheter. Attach the prefilled syringe to the injection cap. Flush the catheter by pushing the plunger forward until all of the liquid from the syringe is in the catheter. Remove the syringe from the injection cap. Clamp the catheter. Problems and solutions If blood cannot be completely cleared from the injection cap, you may need to have the injection cap replaced. If the catheter is difficult to flush, use the pulsing method. The pulsing method involves pushing only a few milliliters of solution into the catheter at a time and pausing between pushes. If you do not see blood in the catheter when you pull back on the syringe, change your body position, such as by raising your arms above your head. Take a deep breath and cough. Then, pull back on the syringe. If you still do not see blood, flush the catheter with a small amount of solution. Then, change positions again and take a breath or cough. Pull back on the syringe again. If you still do not see blood, finish flushing the catheter and contact your health care provider. Do not use your catheter until your health care provider says it is okay. General tips Have someone help you flush your catheter, if possible. Do not force fluid   through your catheter. Do not use a syringe that is larger or smaller than 10 mL. Using a smaller syringe can make the catheter burst. Do not use your catheter without flushing it first if it has heparin in it. Contact a health care provider if: You cannot see any blood in the catheter when you flush it before using it. Your catheter is difficult  to flush. Get help right away if: You cannot flush the catheter. The catheter leaks when you flush it or when there is fluid in it. There are cracks or breaks in the catheter. Summary It is important to flush your tunneled central venous catheter each time you use it, both before and after you use it. Scrub the injection cap for 15 seconds with a disinfecting wipe before and after you flush it. When you flush your catheter, make sure you follow any specific instructions from your health care provider or the manufacturer. Get help right away if you cannot flush the catheter. This information is not intended to replace advice given to you by your health care provider. Make sure you discuss any questions you have with your health care provider. Document Revised: 06/19/2019 Document Reviewed: 06/26/2018 Elsevier Patient Education  2022 Elsevier Inc.  

## 2021-01-31 ENCOUNTER — Inpatient Hospital Stay: Payer: 59

## 2021-01-31 ENCOUNTER — Other Ambulatory Visit: Payer: Self-pay

## 2021-01-31 VITALS — BP 123/94 | HR 92 | Temp 98.0°F | Resp 18

## 2021-01-31 DIAGNOSIS — Z452 Encounter for adjustment and management of vascular access device: Secondary | ICD-10-CM

## 2021-01-31 DIAGNOSIS — Z5111 Encounter for antineoplastic chemotherapy: Secondary | ICD-10-CM | POA: Diagnosis not present

## 2021-01-31 MED ORDER — HEPARIN SOD (PORK) LOCK FLUSH 100 UNIT/ML IV SOLN
250.0000 [IU] | Freq: Once | INTRAVENOUS | Status: AC
  Administered 2021-01-31: 250 [IU] via INTRAVENOUS

## 2021-01-31 MED ORDER — SODIUM CHLORIDE 0.9% FLUSH
10.0000 mL | Freq: Once | INTRAVENOUS | Status: AC
  Administered 2021-01-31: 10 mL via INTRAVENOUS

## 2021-01-31 NOTE — Patient Instructions (Signed)
PICC Home Care Guide A peripherally inserted central catheter (PICC) is a form of IV access that allows medicines and IV fluids to be quickly distributed throughout the body. The PICC is a long, thin, flexible tube (catheter) that is inserted into a vein in the upper arm. The catheter ends in a large vein in the chest (superior vena cava, or SVC). After the PICC is inserted, a chest X-ray may be done to make sure that it is in the correct place. A PICC may be placed for different reasons, such as: To give medicines and liquid nutrition. To give IV fluids and blood products. If there is trouble placing a peripheral intravenous (PIV) catheter. If taken care of properly, a PICC can remain in place for several months. Having a PICC can also allow a person to go home from the hospital sooner. Medicine and PICC care can be managed at home by a family member, caregiver, or home health care team. What are the risks? Generally, having a PICC is safe. However, problems may occur, including: A blood clot (thrombus) forming in or at the tip of the PICC. A blood clot forming in a vein (deep vein thrombosis) or traveling to the lung (pulmonary embolism). Inflammation of the vein (phlebitis) in which the PICC is placed. Infection. Central line associated blood stream infection (CLABSI) is a serious infection that often requires hospitalization. PICC movement (malposition). The PICC tip may move from its original position due to excessive physical activity, forceful coughing, sneezing, or vomiting. A break or cut in the PICC. It is important not to use scissors near the PICC. Nerve or tendon irritation or injury during PICC insertion. How to take care of your PICC Preventing problems You and any caregivers should wash your hands often with soap. Wash hands: Before touching the PICC line or the infusion device. Before changing a bandage (dressing). Flush the PICC as told by your health care provider. Let your  health care provider know right away if the PICC is hard to flush or does not flush. Do not use force to flush the PICC. Do not use a syringe that is less than 10 mL to flush the PICC. Avoid blood pressure checks on the arm in which the PICC is placed. Never pull or tug on the PICC. Do not take the PICC out yourself. Only a trained clinical professional should remove the PICC. Use clean and sterile supplies only. Keep the supplies in a dry place. Do not reuse needles, syringes, or any other supplies. Doing that can lead to infection. Keep pets and children away from your PICC line. Check the PICC insertion site every day for signs of infection. Check for: Leakage. Redness, swelling, or pain. Fluid or blood. Warmth. Pus or a bad smell. PICC dressing care Keep your PICC bandage (dressing) clean and dry to prevent infection. Do not take baths, swim, or use a hot tub until your health care provider approves. Ask your health care provider if you can take showers. You may only be allowed to take sponge baths for bathing. When you are allowed to shower: Ask your health care provider to teach you how to wrap the PICC line. Cover the PICC line with clear plastic wrap and tape to keep it dry while showering. Follow instructions from your health care provider about how to take care of your insertion site and dressing. Make sure you: Wash your hands with soap and water before you change your bandage (dressing). If soap and water are  not available, use hand sanitizer. Change your dressing as told by your health care provider. Leave stitches (sutures), skin glue, or adhesive strips in place. These skin closures may need to stay in place for 2 weeks or longer. If adhesive strip edges start to loosen and curl up, you may trim the loose edges. Do not remove adhesive strips completely unless your health care provider tells you to do that. Change your PICC dressing if it becomes loose or wet. General  instructions  Carry your PICC identification card or wear a medical alert bracelet at all times. Keep the tube clamped at all times, unless it is being used. Carry a smooth-edge clamp with you at all times to place on the tube if it breaks. Do not use scissors or sharp objects near the tube. You may bend your arm and move it freely. If your PICC is near or at the bend of your elbow, avoid activity with repeated motion at the elbow. Avoid lifting heavy objects as told by your health care provider. Keep all follow-up visits as told by your health care provider. This is important. Disposal of supplies Throw away any syringes in a disposal container that is meant for sharp items (sharps container). You can buy a sharps container from a pharmacy, or you can make one by using an empty hard plastic bottle with a cover. Place any used dressings or infusion bags into a plastic bag. Throw that bag in the trash. Contact a health care provider if: You have pain in your arm, ear, face, or teeth. You have a fever or chills. You have redness, swelling, or pain around the insertion site. You have fluid or blood coming from the insertion site. Your insertion site feels warm to the touch. You have pus or a bad smell coming from the insertion site. Your skin feels hard and raised around the insertion site. Get help right away if: Your PICC is accidentally pulled all the way out. If this happens, cover the insertion site with a bandage or gauze dressing. Do not throw the PICC away. Your health care provider will need to check it. Your PICC was tugged or pulled and has partially come out. Do not  push the PICC back in. You cannot flush the PICC, it is hard to flush, or the PICC leaks around the insertion site when it is flushed. You hear a "flushing" sound when the PICC is flushed. You feel your heart racing or skipping beats. There is a hole or tear in the PICC. You have swelling in the arm in which the PICC  was inserted. You have a red streak going up your arm from where the PICC was inserted. Summary A peripherally inserted central catheter (PICC) is a long, thin, flexible tube (catheter) that is inserted into a vein in the upper arm. The PICC is inserted using a sterile technique by a specially trained nurse or physician. Only a trained clinical professional should remove it. Keep your PICC identification card with you at all times. Avoid blood pressure checks on the arm in which the PICC is placed. If cared for properly, a PICC can remain in place for several months. Having a PICC can also allow a person to go home from the hospital sooner. This information is not intended to replace advice given to you by your health care provider. Make sure you discuss any questions you have with your health care provider. Document Revised: 08/16/2019 Document Reviewed: 08/20/2019 Elsevier Patient Education  2022  [employer].

## 2021-02-02 ENCOUNTER — Telehealth: Payer: Self-pay

## 2021-02-02 ENCOUNTER — Encounter (HOSPITAL_BASED_OUTPATIENT_CLINIC_OR_DEPARTMENT_OTHER): Payer: Self-pay

## 2021-02-02 ENCOUNTER — Inpatient Hospital Stay: Payer: 59

## 2021-02-02 ENCOUNTER — Ambulatory Visit (HOSPITAL_BASED_OUTPATIENT_CLINIC_OR_DEPARTMENT_OTHER)
Admission: RE | Admit: 2021-02-02 | Discharge: 2021-02-02 | Disposition: A | Discharge status: Home / Self Care | Payer: 59 | Source: Ambulatory Visit | Attending: Hematology & Oncology | Admitting: Hematology & Oncology

## 2021-02-02 ENCOUNTER — Other Ambulatory Visit: Payer: Self-pay

## 2021-02-02 VITALS — BP 126/92 | HR 93 | Temp 97.9°F | Resp 17

## 2021-02-02 DIAGNOSIS — I259 Chronic ischemic heart disease, unspecified: Secondary | ICD-10-CM | POA: Insufficient documentation

## 2021-02-02 DIAGNOSIS — I2690 Septic pulmonary embolism without acute cor pulmonale: Secondary | ICD-10-CM | POA: Insufficient documentation

## 2021-02-02 DIAGNOSIS — Z452 Encounter for adjustment and management of vascular access device: Secondary | ICD-10-CM

## 2021-02-02 MED ORDER — HEPARIN SOD (PORK) LOCK FLUSH 100 UNIT/ML IV SOLN
250.0000 [IU] | Freq: Once | INTRAVENOUS | Status: AC
  Administered 2021-02-02: 250 [IU] via INTRAVENOUS

## 2021-02-02 MED ORDER — IOHEXOL 350 MG/ML SOLN
100.0000 mL | Freq: Once | INTRAVENOUS | Status: AC | PRN
  Administered 2021-02-02: 100 mL via INTRAVENOUS

## 2021-02-02 MED ORDER — SODIUM CHLORIDE 0.9% FLUSH
10.0000 mL | Freq: Once | INTRAVENOUS | Status: AC
Start: 2021-02-02 — End: 2021-02-02
  Administered 2021-02-02: 10 mL via INTRAVENOUS

## 2021-02-02 NOTE — Patient Instructions (Signed)

## 2021-02-02 NOTE — Telephone Encounter (Signed)
-----   Message from [name], MD sent at 02/02/2021 12:16 PM EDT ----- Please call and tell her that the blood clots in her lung are almost all gone.  There is still a little bit of blood clot but not nearly as much as when she first had this.  The blood thinner is working very nicely.  [name]

## 2021-02-02 NOTE — Telephone Encounter (Signed)
Called and informed patient of scan results, patient verbalized understanding and denies any questions or concerns at this time.   

## 2021-02-04 ENCOUNTER — Inpatient Hospital Stay: Payer: 59

## 2021-02-04 ENCOUNTER — Other Ambulatory Visit: Payer: Self-pay

## 2021-02-04 VITALS — BP 118/90 | HR 80 | Temp 97.7°F | Resp 17

## 2021-02-04 DIAGNOSIS — Z5111 Encounter for antineoplastic chemotherapy: Secondary | ICD-10-CM | POA: Diagnosis not present

## 2021-02-04 DIAGNOSIS — C180 Malignant neoplasm of cecum: Secondary | ICD-10-CM

## 2021-02-04 MED ORDER — HEPARIN SOD (PORK) LOCK FLUSH 100 UNIT/ML IV SOLN
500.0000 [IU] | Freq: Once | INTRAVENOUS | Status: AC
  Administered 2021-02-04: 250 [IU]

## 2021-02-04 MED ORDER — SODIUM CHLORIDE 0.9% FLUSH
10.0000 mL | Freq: Once | INTRAVENOUS | Status: AC
  Administered 2021-02-04: 10 mL

## 2021-02-04 NOTE — Progress Notes (Signed)
Reviewed with patient her trend in diastolic blood pressures being in the 90s for the past few weeks. Reviewed with patients sign and symptoms of hypertension. Reviewed with patient to buy a home blood pressure monitor and keep a log of her blood pressures daily. Pt aware to follow up with her PCP for high blood pressure. Pt verbalized understanding and had no further questions.

## 2021-02-04 NOTE — Patient Instructions (Signed)
PICC Home Care Guide A peripherally inserted central catheter (PICC) is a form of IV access that allows medicines and IV fluids to be quickly distributed throughout the body. The PICC is a long, thin, flexible tube (catheter) that is inserted into a vein in the upper arm. The catheter ends in a large vein in the chest (superior vena cava, or SVC). After the PICC is inserted, a chest X-ray may be done to make sure that it is in the correct place. A PICC may be placed for different reasons, such as: To give medicines and liquid nutrition. To give IV fluids and blood products. If there is trouble placing a peripheral intravenous (PIV) catheter. If taken care of properly, a PICC can remain in place for several months. Having a PICC can also allow a person to go home from the hospital sooner. Medicine and PICC care can be managed at home by a family member, caregiver, or home health care team. What are the risks? Generally, having a PICC is safe. However, problems may occur, including: A blood clot (thrombus) forming in or at the tip of the PICC. A blood clot forming in a vein (deep vein thrombosis) or traveling to the lung (pulmonary embolism). Inflammation of the vein (phlebitis) in which the PICC is placed. Infection. Central line associated blood stream infection (CLABSI) is a serious infection that often requires hospitalization. PICC movement (malposition). The PICC tip may move from its original position due to excessive physical activity, forceful coughing, sneezing, or vomiting. A break or cut in the PICC. It is important not to use scissors near the PICC. Nerve or tendon irritation or injury during PICC insertion. How to take care of your PICC Preventing problems You and any caregivers should wash your hands often with soap. Wash hands: Before touching the PICC line or the infusion device. Before changing a bandage (dressing). Flush the PICC as told by your health care provider. Let your  health care provider know right away if the PICC is hard to flush or does not flush. Do not use force to flush the PICC. Do not use a syringe that is less than 10 mL to flush the PICC. Avoid blood pressure checks on the arm in which the PICC is placed. Never pull or tug on the PICC. Do not take the PICC out yourself. Only a trained clinical professional should remove the PICC. Use clean and sterile supplies only. Keep the supplies in a dry place. Do not reuse needles, syringes, or any other supplies. Doing that can lead to infection. Keep pets and children away from your PICC line. Check the PICC insertion site every day for signs of infection. Check for: Leakage. Redness, swelling, or pain. Fluid or blood. Warmth. Pus or a bad smell. PICC dressing care Keep your PICC bandage (dressing) clean and dry to prevent infection. Do not take baths, swim, or use a hot tub until your health care provider approves. Ask your health care provider if you can take showers. You may only be allowed to take sponge baths for bathing. When you are allowed to shower: Ask your health care provider to teach you how to wrap the PICC line. Cover the PICC line with clear plastic wrap and tape to keep it dry while showering. Follow instructions from your health care provider about how to take care of your insertion site and dressing. Make sure you: Wash your hands with soap and water before you change your bandage (dressing). If soap and water are  not available, use hand sanitizer. Change your dressing as told by your health care provider. Leave stitches (sutures), skin glue, or adhesive strips in place. These skin closures may need to stay in place for 2 weeks or longer. If adhesive strip edges start to loosen and curl up, you may trim the loose edges. Do not remove adhesive strips completely unless your health care provider tells you to do that. Change your PICC dressing if it becomes loose or wet. General  instructions  Carry your PICC identification card or wear a medical alert bracelet at all times. Keep the tube clamped at all times, unless it is being used. Carry a smooth-edge clamp with you at all times to place on the tube if it breaks. Do not use scissors or sharp objects near the tube. You may bend your arm and move it freely. If your PICC is near or at the bend of your elbow, avoid activity with repeated motion at the elbow. Avoid lifting heavy objects as told by your health care provider. Keep all follow-up visits as told by your health care provider. This is important. Disposal of supplies Throw away any syringes in a disposal container that is meant for sharp items (sharps container). You can buy a sharps container from a pharmacy, or you can make one by using an empty hard plastic bottle with a cover. Place any used dressings or infusion bags into a plastic bag. Throw that bag in the trash. Contact a health care provider if: You have pain in your arm, ear, face, or teeth. You have a fever or chills. You have redness, swelling, or pain around the insertion site. You have fluid or blood coming from the insertion site. Your insertion site feels warm to the touch. You have pus or a bad smell coming from the insertion site. Your skin feels hard and raised around the insertion site. Get help right away if: Your PICC is accidentally pulled all the way out. If this happens, cover the insertion site with a bandage or gauze dressing. Do not throw the PICC away. Your health care provider will need to check it. Your PICC was tugged or pulled and has partially come out. Do not  push the PICC back in. You cannot flush the PICC, it is hard to flush, or the PICC leaks around the insertion site when it is flushed. You hear a "flushing" sound when the PICC is flushed. You feel your heart racing or skipping beats. There is a hole or tear in the PICC. You have swelling in the arm in which the PICC  was inserted. You have a red streak going up your arm from where the PICC was inserted. Summary A peripherally inserted central catheter (PICC) is a long, thin, flexible tube (catheter) that is inserted into a vein in the upper arm. The PICC is inserted using a sterile technique by a specially trained nurse or physician. Only a trained clinical professional should remove it. Keep your PICC identification card with you at all times. Avoid blood pressure checks on the arm in which the PICC is placed. If cared for properly, a PICC can remain in place for several months. Having a PICC can also allow a person to go home from the hospital sooner. This information is not intended to replace advice given to you by your health care provider. Make sure you discuss any questions you have with your health care provider. Document Revised: 08/16/2019 Document Reviewed: 08/20/2019 Elsevier Patient Education  2022  [employer].

## 2021-02-07 ENCOUNTER — Other Ambulatory Visit: Payer: Self-pay

## 2021-02-07 ENCOUNTER — Inpatient Hospital Stay: Payer: 59

## 2021-02-07 VITALS — BP 125/81 | HR 77 | Temp 98.2°F | Resp 18

## 2021-02-07 DIAGNOSIS — Z452 Encounter for adjustment and management of vascular access device: Secondary | ICD-10-CM

## 2021-02-07 DIAGNOSIS — Z5111 Encounter for antineoplastic chemotherapy: Secondary | ICD-10-CM | POA: Diagnosis not present

## 2021-02-07 MED ORDER — SODIUM CHLORIDE 0.9% FLUSH
10.0000 mL | Freq: Once | INTRAVENOUS | Status: AC
  Administered 2021-02-07: 10 mL via INTRAVENOUS

## 2021-02-07 MED ORDER — HEPARIN SOD (PORK) LOCK FLUSH 100 UNIT/ML IV SOLN
500.0000 [IU] | Freq: Once | INTRAVENOUS | Status: AC
Start: 2021-02-07 — End: 2021-02-07
  Administered 2021-02-07: 250 [IU] via INTRAVENOUS

## 2021-02-07 MED ORDER — HEPARIN SOD (PORK) LOCK FLUSH 100 UNIT/ML IV SOLN
500.0000 [IU] | Freq: Once | INTRAVENOUS | Status: AC
  Administered 2021-02-07: 250 [IU] via INTRAVENOUS

## 2021-02-08 ENCOUNTER — Ambulatory Visit: Payer: 59

## 2021-02-08 ENCOUNTER — Other Ambulatory Visit: Payer: 59

## 2021-02-08 ENCOUNTER — Ambulatory Visit: Payer: 59 | Admitting: Hematology & Oncology

## 2021-02-09 ENCOUNTER — Other Ambulatory Visit: Payer: Self-pay

## 2021-02-09 ENCOUNTER — Inpatient Hospital Stay: Payer: 59

## 2021-02-09 ENCOUNTER — Inpatient Hospital Stay (HOSPITAL_BASED_OUTPATIENT_CLINIC_OR_DEPARTMENT_OTHER): Payer: 59 | Admitting: Hematology & Oncology

## 2021-02-09 ENCOUNTER — Encounter: Payer: Self-pay | Admitting: Hematology & Oncology

## 2021-02-09 VITALS — BP 136/81 | HR 71 | Temp 98.0°F | Resp 17 | Wt 271.0 lb

## 2021-02-09 DIAGNOSIS — C180 Malignant neoplasm of cecum: Secondary | ICD-10-CM

## 2021-02-09 DIAGNOSIS — Z452 Encounter for adjustment and management of vascular access device: Secondary | ICD-10-CM

## 2021-02-09 DIAGNOSIS — I2690 Septic pulmonary embolism without acute cor pulmonale: Secondary | ICD-10-CM

## 2021-02-09 DIAGNOSIS — Z5111 Encounter for antineoplastic chemotherapy: Secondary | ICD-10-CM | POA: Diagnosis not present

## 2021-02-09 LAB — CBC WITH DIFFERENTIAL (CANCER CENTER ONLY)
Abs Immature Granulocytes: 0 10*3/uL (ref 0.00–0.07)
Basophils Absolute: 0 10*3/uL (ref 0.0–0.1)
Basophils Relative: 1 %
Eosinophils Absolute: 0.1 10*3/uL (ref 0.0–0.5)
Eosinophils Relative: 2 %
HCT: 35.3 % — ABNORMAL LOW (ref 36.0–46.0)
Hemoglobin: 11.6 g/dL — ABNORMAL LOW (ref 12.0–15.0)
Immature Granulocytes: 0 %
Lymphocytes Relative: 57 %
Lymphs Abs: 1.6 10*3/uL (ref 0.7–4.0)
MCH: 30 pg (ref 26.0–34.0)
MCHC: 32.9 g/dL (ref 30.0–36.0)
MCV: 91.2 fL (ref 80.0–100.0)
Monocytes Absolute: 0.3 10*3/uL (ref 0.1–1.0)
Monocytes Relative: 12 %
Neutro Abs: 0.8 10*3/uL — ABNORMAL LOW (ref 1.7–7.7)
Neutrophils Relative %: 28 %
Platelet Count: 160 10*3/uL (ref 150–400)
RBC: 3.87 MIL/uL (ref 3.87–5.11)
RDW: 17.3 % — ABNORMAL HIGH (ref 11.5–15.5)
WBC Count: 2.9 10*3/uL — ABNORMAL LOW (ref 4.0–10.5)
nRBC: 0 % (ref 0.0–0.2)

## 2021-02-09 LAB — CMP (CANCER CENTER ONLY)
ALT: 30 U/L (ref 0–44)
AST: 26 U/L (ref 15–41)
Albumin: 3.5 g/dL (ref 3.5–5.0)
Alkaline Phosphatase: 72 U/L (ref 38–126)
Anion gap: 7 (ref 5–15)
BUN: 9 mg/dL (ref 6–20)
CO2: 30 mmol/L (ref 22–32)
Calcium: 9.5 mg/dL (ref 8.9–10.3)
Chloride: 105 mmol/L (ref 98–111)
Creatinine: 0.69 mg/dL (ref 0.44–1.00)
GFR, Estimated: >60 mL/min (ref >60)
Glucose, Bld: 106 mg/dL — ABNORMAL HIGH (ref 70–99)
Potassium: 3.4 mmol/L — ABNORMAL LOW (ref 3.5–5.1)
Sodium: 142 mmol/L (ref 135–145)
Total Bilirubin: 0.4 mg/dL (ref 0.3–1.2)
Total Protein: 6.3 g/dL — ABNORMAL LOW (ref 6.5–8.1)

## 2021-02-09 LAB — CEA (IN HOUSE-CHCC): CEA (CHCC-In House): 5.04 ng/mL — ABNORMAL HIGH (ref 0.00–5.00)

## 2021-02-09 LAB — TOTAL PROTEIN, URINE DIPSTICK: Protein, ur: NEGATIVE mg/dL

## 2021-02-09 MED ORDER — SODIUM CHLORIDE 0.9% FLUSH
10.0000 mL | INTRAVENOUS | Status: AC | PRN
  Administered 2021-02-09: 10 mL via INTRAVENOUS

## 2021-02-09 MED ORDER — HEPARIN SOD (PORK) LOCK FLUSH 100 UNIT/ML IV SOLN
500.0000 [IU] | Freq: Once | INTRAVENOUS | Status: AC
  Administered 2021-02-09: 500 [IU] via INTRAVENOUS

## 2021-02-09 NOTE — Addendum Note (Signed)
Addended by: Randolm Idol on: 02/09/2021 09:49 AM   Modules accepted: Orders, SmartSet

## 2021-02-09 NOTE — Progress Notes (Signed)
Hematology and Oncology Follow Up Visit  Lauren Mcpherson [id number] 03/30/1966 55 y.o. 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 daily-start on 01/22/2021     Interim History:  Lauren Mcpherson is back for follow-up.  We did find where the pulmonary embolism originated.  She Doppler of her legs.  This showed that there was a thrombus in the right femoral vein.  She is on Xarelto.  We will keep her on Xarelto for the long-term.  Unfortunately, her white cell count is just a little bit too low for [place] to treat her today.  I think that since she is done so well, we can probably hold off on treatment for a week.  Her last CEA level from 01/26/2021 was 4.8.  She had no problems with the pain.  Maybe a little bit of pain in the right knee.  There is no change in bowel or bladder habits.  She has had no bleeding.  She has had no cough or shortness of breath.  She has had no nausea or vomiting.  There is been no mouth sores.  Overall, I would say her performance status is probably ECOG 1.      Medications:  Current Outpatient Medications:    dexamethasone (DECADRON) 6 MG tablet, Take 6 mg by mouth See admin instructions. Take 6 mg by mouth daily for 3 days, starting the day after chemotherapy. Take with food., Disp: , Rfl:    loperamide (IMODIUM A-D) 2 MG tablet, Take 2 at onset of diarrhea, then 1 every 2hrs until 12hr without a BM. May take 2 tab every 4hrs at bedtime. If diarrhea recurs repeat. (Patient taking differently: Take 2-4 mg by mouth See admin instructions. Take 4 mg by mouth at onset of diarrhea, then 2 mg every two hours until it has been 12 hours without a bowel movement. May take 4 mg by mouth every four hours at bedtime. Repeat, if diarrhea recurs.), Disp:  100 tablet, Rfl: 1   ondansetron (ZOFRAN) 8 MG tablet, Take 1 tablet (8 mg total) by mouth 2 (two) times daily as needed. Start on day 3 after chemotherapy. (Patient taking differently: Take 8 mg by mouth See admin instructions. Take 8 mg by mouth two times a day as needed for nausea or vomiting- start on day 3 after chemotherapy), Disp: 30 tablet, Rfl: 1   prochlorperazine (COMPAZINE) 10 MG tablet, Take 1 tablet (10 mg total) by mouth every 6 (six) hours as needed (Nausea or vomiting). (Patient taking differently: Take 10 mg by mouth every 6 (six) hours as needed for vomiting or nausea.), Disp: 30 tablet, Rfl: 1   RIVAROXABAN (XARELTO) VTE STARTER PACK (15 & 20 MG), Follow package directions: Take one 65m tablet by mouth twice a day. On day 22, switch to one 243mtablet once a day. Take with food., Disp: 51 each, Rfl: 0 No current facility-administered medications for this visit.  Facility-Administered Medications Ordered in Other Visits:    heparin lock flush 100 unit/mL, 500 Units, Intracatheter, Once PRN, Genni Buske, PeRudell CobbMD   sodium chloride flush (NS) 0.9 % injection 10 mL, 10 mL, Intracatheter, PRN, EnMarin OlpPeRudell CobbMD  Allergies: No Known Allergies  Past Medical History, Surgical history, Social history, and Family History were reviewed and updated.  Review of Systems: Review of Systems  Constitutional: Negative.   HENT:  Negative.    Eyes: Negative.   Respiratory: Negative.    Cardiovascular: Negative.   Gastrointestinal: Negative.   Endocrine: Negative.   Genitourinary: Negative.    Musculoskeletal: Negative.   Skin: Negative.   Neurological: Negative.   Hematological: Negative.   Psychiatric/Behavioral: Negative.     Physical Exam:  weight is 271 lb (122.9 kg). Her oral temperature is 98 F (36.7 C). Her blood pressure is 136/81 and her pulse is 71. Her respiration is 17 and oxygen saturation is 100%.   Wt Readings from Last 3 Encounters:  02/09/21 271 lb (122.9 kg)   01/26/21 278 lb 1.3 oz (126.1 kg)  01/22/21 277 lb 12.5 oz (126 kg)    Physical Exam Vitals reviewed.  HENT:     Head: Normocephalic and atraumatic.  Eyes:     Pupils: Pupils are equal, round, and reactive to light.  Cardiovascular:     Rate and Rhythm: Normal rate and regular rhythm.     Heart sounds: Normal heart sounds.  Pulmonary:     Effort: Pulmonary effort is normal.     Breath sounds: Normal breath sounds.  Abdominal:     General: Bowel sounds are normal.     Palpations: Abdomen is soft.     Comments: Her abdominal exam is obese.  She has a healed laparoscopy scar in the midline.  There is no fluid wave.  There is no guarding or rebound tenderness.  I feel no palpable liver or spleen tip.  Musculoskeletal:        General: No tenderness or deformity. Normal range of motion.     Cervical back: Normal range of motion.  Lymphadenopathy:     Cervical: No cervical adenopathy.  Skin:    General: Skin is warm and dry.     Findings: No erythema or rash.  Neurological:     Mental Status: She is alert and oriented to person, place, and time.  Psychiatric:        Behavior: Behavior normal.        Thought Content: Thought content normal.        Judgment: Judgment normal.     Lab Results  Component Value Date   WBC 2.9 (L) 02/09/2021   HGB 11.6 (L) 02/09/2021   HCT 35.3 (L) 02/09/2021   MCV 91.2 02/09/2021   PLT 160 02/09/2021     Chemistry      Component Value Date/Time   NA 142 02/09/2021 0804   K 3.4 (L) 02/09/2021 0804   CL 105 02/09/2021 0804   CO2 30 02/09/2021 0804   BUN 9 02/09/2021 0804   CREATININE 0.69 02/09/2021 0804      Component Value Date/Time   CALCIUM 9.5 02/09/2021 0804   ALKPHOS 72 02/09/2021 0804   AST 26 02/09/2021 0804   ALT 30 02/09/2021 0804   BILITOT 0.4 02/09/2021 0804      Impression and Plan: Lauren Mcpherson is a very nice 55 year old African-American female.  She has metastatic adenocarcinoma of the cecum.  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  Again, we will hold off on her treatment today.  We will have her come back next week.  I would suspect that her white cell count should be adequate.  She recently had scans so I do think we have to do scans on her for a while.  I  would probably wait till after her 8th cycle of treatment.  I am just happy that her quality of life is doing so well right now.  I will plan to see her back myself for her seventh cycle of treatment.  She will come back next week for her sixth cycle.   [name], MD 10/19/20229:23 AM

## 2021-02-09 NOTE — Patient Instructions (Signed)
PICC Home Care Guide A peripherally inserted central catheter (PICC) is a form of IV access that allows medicines and IV fluids to be quickly distributed throughout the body. The PICC is a long, thin, flexible tube (catheter) that is inserted into a vein in the upper arm. The catheter ends in a large vein in the chest (superior vena cava, or SVC). After the PICC is inserted, a chest X-ray may be done to make sure that it is in the correct place. A PICC may be placed for different reasons, such as: To give medicines and liquid nutrition. To give IV fluids and blood products. If there is trouble placing a peripheral intravenous (PIV) catheter. If taken care of properly, a PICC can remain in place for several months. Having a PICC can also allow a person to go home from the hospital sooner. Medicine and PICC care can be managed at home by a family member, caregiver, or home health care team. What are the risks? Generally, having a PICC is safe. However, problems may occur, including: A blood clot (thrombus) forming in or at the tip of the PICC. A blood clot forming in a vein (deep vein thrombosis) or traveling to the lung (pulmonary embolism). Inflammation of the vein (phlebitis) in which the PICC is placed. Infection. Central line associated blood stream infection (CLABSI) is a serious infection that often requires hospitalization. PICC movement (malposition). The PICC tip may move from its original position due to excessive physical activity, forceful coughing, sneezing, or vomiting. A break or cut in the PICC. It is important not to use scissors near the PICC. Nerve or tendon irritation or injury during PICC insertion. How to take care of your PICC Preventing problems You and any caregivers should wash your hands often with soap. Wash hands: Before touching the PICC line or the infusion device. Before changing a bandage (dressing). Flush the PICC as told by your health care provider. Let your  health care provider know right away if the PICC is hard to flush or does not flush. Do not use force to flush the PICC. Do not use a syringe that is less than 10 mL to flush the PICC. Avoid blood pressure checks on the arm in which the PICC is placed. Never pull or tug on the PICC. Do not take the PICC out yourself. Only a trained clinical professional should remove the PICC. Use clean and sterile supplies only. Keep the supplies in a dry place. Do not reuse needles, syringes, or any other supplies. Doing that can lead to infection. Keep pets and children away from your PICC line. Check the PICC insertion site every day for signs of infection. Check for: Leakage. Redness, swelling, or pain. Fluid or blood. Warmth. Pus or a bad smell. PICC dressing care Keep your PICC bandage (dressing) clean and dry to prevent infection. Do not take baths, swim, or use a hot tub until your health care provider approves. Ask your health care provider if you can take showers. You may only be allowed to take sponge baths for bathing. When you are allowed to shower: Ask your health care provider to teach you how to wrap the PICC line. Cover the PICC line with clear plastic wrap and tape to keep it dry while showering. Follow instructions from your health care provider about how to take care of your insertion site and dressing. Make sure you: Wash your hands with soap and water before you change your bandage (dressing). If soap and water are  not available, use hand sanitizer. Change your dressing as told by your health care provider. Leave stitches (sutures), skin glue, or adhesive strips in place. These skin closures may need to stay in place for 2 weeks or longer. If adhesive strip edges start to loosen and curl up, you may trim the loose edges. Do not remove adhesive strips completely unless your health care provider tells you to do that. Change your PICC dressing if it becomes loose or wet. General  instructions  Carry your PICC identification card or wear a medical alert bracelet at all times. Keep the tube clamped at all times, unless it is being used. Carry a smooth-edge clamp with you at all times to place on the tube if it breaks. Do not use scissors or sharp objects near the tube. You may bend your arm and move it freely. If your PICC is near or at the bend of your elbow, avoid activity with repeated motion at the elbow. Avoid lifting heavy objects as told by your health care provider. Keep all follow-up visits as told by your health care provider. This is important. Disposal of supplies Throw away any syringes in a disposal container that is meant for sharp items (sharps container). You can buy a sharps container from a pharmacy, or you can make one by using an empty hard plastic bottle with a cover. Place any used dressings or infusion bags into a plastic bag. Throw that bag in the trash. Contact a health care provider if: You have pain in your arm, ear, face, or teeth. You have a fever or chills. You have redness, swelling, or pain around the insertion site. You have fluid or blood coming from the insertion site. Your insertion site feels warm to the touch. You have pus or a bad smell coming from the insertion site. Your skin feels hard and raised around the insertion site. Get help right away if: Your PICC is accidentally pulled all the way out. If this happens, cover the insertion site with a bandage or gauze dressing. Do not throw the PICC away. Your health care provider will need to check it. Your PICC was tugged or pulled and has partially come out. Do not  push the PICC back in. You cannot flush the PICC, it is hard to flush, or the PICC leaks around the insertion site when it is flushed. You hear a "flushing" sound when the PICC is flushed. You feel your heart racing or skipping beats. There is a hole or tear in the PICC. You have swelling in the arm in which the PICC  was inserted. You have a red streak going up your arm from where the PICC was inserted. Summary A peripherally inserted central catheter (PICC) is a long, thin, flexible tube (catheter) that is inserted into a vein in the upper arm. The PICC is inserted using a sterile technique by a specially trained nurse or physician. Only a trained clinical professional should remove it. Keep your PICC identification card with you at all times. Avoid blood pressure checks on the arm in which the PICC is placed. If cared for properly, a PICC can remain in place for several months. Having a PICC can also allow a person to go home from the hospital sooner. This information is not intended to replace advice given to you by your health care provider. Make sure you discuss any questions you have with your health care provider. Document Revised: 08/16/2019 Document Reviewed: 08/20/2019 Elsevier Patient Education  2022  [employer].

## 2021-02-11 ENCOUNTER — Other Ambulatory Visit: Payer: Self-pay

## 2021-02-11 ENCOUNTER — Inpatient Hospital Stay: Payer: 59

## 2021-02-11 VITALS — BP 124/82 | HR 70 | Resp 17

## 2021-02-11 DIAGNOSIS — Z5111 Encounter for antineoplastic chemotherapy: Secondary | ICD-10-CM | POA: Diagnosis not present

## 2021-02-11 DIAGNOSIS — Z452 Encounter for adjustment and management of vascular access device: Secondary | ICD-10-CM

## 2021-02-11 MED ORDER — SODIUM CHLORIDE 0.9% FLUSH
10.0000 mL | Freq: Once | INTRAVENOUS | Status: AC
Start: 2021-02-11 — End: 2021-02-11
  Administered 2021-02-11: 10 mL via INTRAVENOUS

## 2021-02-11 MED ORDER — HEPARIN SOD (PORK) LOCK FLUSH 100 UNIT/ML IV SOLN
250.0000 [IU] | Freq: Once | INTRAVENOUS | Status: AC
  Administered 2021-02-11: 250 [IU] via INTRAVENOUS

## 2021-02-11 MED ORDER — RIVAROXABAN 20 MG PO TABS
20.0000 mg | ORAL_TABLET | Freq: Every day | ORAL | 3 refills | Status: DC
Start: 2021-02-11 — End: 2021-04-27

## 2021-02-11 NOTE — Patient Instructions (Signed)
PICC Home Care Guide A peripherally inserted central catheter (PICC) is a form of IV access that allows medicines and IV fluids to be quickly distributed throughout the body. The PICC is a long, thin, flexible tube (catheter) that is inserted into a vein in the upper arm. The catheter ends in a large vein in the chest (superior vena cava, or SVC). After the PICC is inserted, a chest X-ray may be done to make sure that it is in the correct place. A PICC may be placed for different reasons, such as: To give medicines and liquid nutrition. To give IV fluids and blood products. If there is trouble placing a peripheral intravenous (PIV) catheter. If taken care of properly, a PICC can remain in place for several months. Having a PICC can also allow a person to go home from the hospital sooner. Medicine and PICC care can be managed at home by a family member, caregiver, or home health care team. What are the risks? Generally, having a PICC is safe. However, problems may occur, including: A blood clot (thrombus) forming in or at the tip of the PICC. A blood clot forming in a vein (deep vein thrombosis) or traveling to the lung (pulmonary embolism). Inflammation of the vein (phlebitis) in which the PICC is placed. Infection. Central line associated blood stream infection (CLABSI) is a serious infection that often requires hospitalization. PICC movement (malposition). The PICC tip may move from its original position due to excessive physical activity, forceful coughing, sneezing, or vomiting. A break or cut in the PICC. It is important not to use scissors near the PICC. Nerve or tendon irritation or injury during PICC insertion. How to take care of your PICC Preventing problems You and any caregivers should wash your hands often with soap. Wash hands: Before touching the PICC line or the infusion device. Before changing a bandage (dressing). Flush the PICC as told by your health care provider. Let your  health care provider know right away if the PICC is hard to flush or does not flush. Do not use force to flush the PICC. Do not use a syringe that is less than 10 mL to flush the PICC. Avoid blood pressure checks on the arm in which the PICC is placed. Never pull or tug on the PICC. Do not take the PICC out yourself. Only a trained clinical professional should remove the PICC. Use clean and sterile supplies only. Keep the supplies in a dry place. Do not reuse needles, syringes, or any other supplies. Doing that can lead to infection. Keep pets and children away from your PICC line. Check the PICC insertion site every day for signs of infection. Check for: Leakage. Redness, swelling, or pain. Fluid or blood. Warmth. Pus or a bad smell. PICC dressing care Keep your PICC bandage (dressing) clean and dry to prevent infection. Do not take baths, swim, or use a hot tub until your health care provider approves. Ask your health care provider if you can take showers. You may only be allowed to take sponge baths for bathing. When you are allowed to shower: Ask your health care provider to teach you how to wrap the PICC line. Cover the PICC line with clear plastic wrap and tape to keep it dry while showering. Follow instructions from your health care provider about how to take care of your insertion site and dressing. Make sure you: Wash your hands with soap and water before you change your bandage (dressing). If soap and water are  not available, use hand sanitizer. Change your dressing as told by your health care provider. Leave stitches (sutures), skin glue, or adhesive strips in place. These skin closures may need to stay in place for 2 weeks or longer. If adhesive strip edges start to loosen and curl up, you may trim the loose edges. Do not remove adhesive strips completely unless your health care provider tells you to do that. Change your PICC dressing if it becomes loose or wet. General  instructions  Carry your PICC identification card or wear a medical alert bracelet at all times. Keep the tube clamped at all times, unless it is being used. Carry a smooth-edge clamp with you at all times to place on the tube if it breaks. Do not use scissors or sharp objects near the tube. You may bend your arm and move it freely. If your PICC is near or at the bend of your elbow, avoid activity with repeated motion at the elbow. Avoid lifting heavy objects as told by your health care provider. Keep all follow-up visits as told by your health care provider. This is important. Disposal of supplies Throw away any syringes in a disposal container that is meant for sharp items (sharps container). You can buy a sharps container from a pharmacy, or you can make one by using an empty hard plastic bottle with a cover. Place any used dressings or infusion bags into a plastic bag. Throw that bag in the trash. Contact a health care provider if: You have pain in your arm, ear, face, or teeth. You have a fever or chills. You have redness, swelling, or pain around the insertion site. You have fluid or blood coming from the insertion site. Your insertion site feels warm to the touch. You have pus or a bad smell coming from the insertion site. Your skin feels hard and raised around the insertion site. Get help right away if: Your PICC is accidentally pulled all the way out. If this happens, cover the insertion site with a bandage or gauze dressing. Do not throw the PICC away. Your health care provider will need to check it. Your PICC was tugged or pulled and has partially come out. Do not  push the PICC back in. You cannot flush the PICC, it is hard to flush, or the PICC leaks around the insertion site when it is flushed. You hear a "flushing" sound when the PICC is flushed. You feel your heart racing or skipping beats. There is a hole or tear in the PICC. You have swelling in the arm in which the PICC  was inserted. You have a red streak going up your arm from where the PICC was inserted. Summary A peripherally inserted central catheter (PICC) is a long, thin, flexible tube (catheter) that is inserted into a vein in the upper arm. The PICC is inserted using a sterile technique by a specially trained nurse or physician. Only a trained clinical professional should remove it. Keep your PICC identification card with you at all times. Avoid blood pressure checks on the arm in which the PICC is placed. If cared for properly, a PICC can remain in place for several months. Having a PICC can also allow a person to go home from the hospital sooner. This information is not intended to replace advice given to you by your health care provider. Make sure you discuss any questions you have with your health care provider. Document Revised: 08/16/2019 Document Reviewed: 08/20/2019 Elsevier Patient Education  2022  [employer].

## 2021-02-14 ENCOUNTER — Other Ambulatory Visit: Payer: Self-pay

## 2021-02-14 ENCOUNTER — Inpatient Hospital Stay: Payer: 59

## 2021-02-14 VITALS — BP 124/78 | HR 89 | Temp 97.7°F | Resp 18

## 2021-02-14 DIAGNOSIS — Z452 Encounter for adjustment and management of vascular access device: Secondary | ICD-10-CM

## 2021-02-14 DIAGNOSIS — Z5111 Encounter for antineoplastic chemotherapy: Secondary | ICD-10-CM | POA: Diagnosis not present

## 2021-02-14 MED ORDER — SODIUM CHLORIDE 0.9% FLUSH
10.0000 mL | Freq: Once | INTRAVENOUS | Status: AC
  Administered 2021-02-14: 10 mL via INTRAVENOUS

## 2021-02-14 MED ORDER — HEPARIN SOD (PORK) LOCK FLUSH 100 UNIT/ML IV SOLN
500.0000 [IU] | Freq: Once | INTRAVENOUS | Status: AC
  Administered 2021-02-14: 500 [IU] via INTRAVENOUS

## 2021-02-14 MED ORDER — HEPARIN SOD (PORK) LOCK FLUSH 100 UNIT/ML IV SOLN: 500.0000 [IU] | Freq: Once | INTRAVENOUS | Status: DC

## 2021-02-16 ENCOUNTER — Other Ambulatory Visit: Payer: Self-pay

## 2021-02-16 ENCOUNTER — Inpatient Hospital Stay: Payer: 59

## 2021-02-16 DIAGNOSIS — C180 Malignant neoplasm of cecum: Secondary | ICD-10-CM

## 2021-02-16 DIAGNOSIS — Z5111 Encounter for antineoplastic chemotherapy: Secondary | ICD-10-CM | POA: Diagnosis not present

## 2021-02-16 LAB — CMP (CANCER CENTER ONLY)
ALT: 23 U/L (ref 0–44)
AST: 23 U/L (ref 15–41)
Albumin: 3.5 g/dL (ref 3.5–5.0)
Alkaline Phosphatase: 65 U/L (ref 38–126)
Anion gap: 8 (ref 5–15)
BUN: 8 mg/dL (ref 6–20)
CO2: 29 mmol/L (ref 22–32)
Calcium: 9.5 mg/dL (ref 8.9–10.3)
Chloride: 105 mmol/L (ref 98–111)
Creatinine: 0.75 mg/dL (ref 0.44–1.00)
GFR, Estimated: >60 mL/min (ref >60)
Glucose, Bld: 105 mg/dL — ABNORMAL HIGH (ref 70–99)
Potassium: 3.6 mmol/L (ref 3.5–5.1)
Sodium: 142 mmol/L (ref 135–145)
Total Bilirubin: 0.3 mg/dL (ref 0.3–1.2)
Total Protein: 6.2 g/dL — ABNORMAL LOW (ref 6.5–8.1)

## 2021-02-16 LAB — CBC WITH DIFFERENTIAL (CANCER CENTER ONLY)
Abs Immature Granulocytes: 0.01 10*3/uL (ref 0.00–0.07)
Basophils Absolute: 0 10*3/uL (ref 0.0–0.1)
Basophils Relative: 1 %
Eosinophils Absolute: 0 10*3/uL (ref 0.0–0.5)
Eosinophils Relative: 1 %
HCT: 35.9 % — ABNORMAL LOW (ref 36.0–46.0)
Hemoglobin: 11.6 g/dL — ABNORMAL LOW (ref 12.0–15.0)
Immature Granulocytes: 0 %
Lymphocytes Relative: 43 %
Lymphs Abs: 1.9 10*3/uL (ref 0.7–4.0)
MCH: 30.4 pg (ref 26.0–34.0)
MCHC: 32.3 g/dL (ref 30.0–36.0)
MCV: 94 fL (ref 80.0–100.0)
Monocytes Absolute: 0.7 10*3/uL (ref 0.1–1.0)
Monocytes Relative: 17 %
Neutro Abs: 1.7 10*3/uL (ref 1.7–7.7)
Neutrophils Relative %: 38 %
Platelet Count: 357 10*3/uL (ref 150–400)
RBC: 3.82 MIL/uL — ABNORMAL LOW (ref 3.87–5.11)
RDW: 17.4 % — ABNORMAL HIGH (ref 11.5–15.5)
Smear Review: NORMAL
WBC Count: 4.4 10*3/uL (ref 4.0–10.5)
nRBC: 0 % (ref 0.0–0.2)

## 2021-02-16 LAB — TOTAL PROTEIN, URINE DIPSTICK: Protein, ur: NEGATIVE mg/dL

## 2021-02-16 MED ORDER — SODIUM CHLORIDE 0.9% FLUSH
10.0000 mL | INTRAVENOUS | Status: DC | PRN
  Administered 2021-02-16: 10 mL

## 2021-02-16 MED ORDER — SODIUM CHLORIDE 0.9 % IV SOLN
150.0000 mg | Freq: Once | INTRAVENOUS | Status: AC
  Administered 2021-02-16: 150 mg via INTRAVENOUS
  Filled 2021-02-16: qty 150

## 2021-02-16 MED ORDER — HEPARIN SOD (PORK) LOCK FLUSH 100 UNIT/ML IV SOLN
500.0000 [IU] | Freq: Once | INTRAVENOUS | Status: AC | PRN
  Administered 2021-02-16: 250 [IU]

## 2021-02-16 MED ORDER — LEUCOVORIN CALCIUM INJECTION 350 MG
400.0000 mg/m2 | Freq: Once | INTRAMUSCULAR | Status: AC
  Administered 2021-02-16: 956 mg via INTRAVENOUS
  Filled 2021-02-16: qty 47.8

## 2021-02-16 MED ORDER — FLUOROURACIL CHEMO INJECTION 5 GM/100ML
2400.0000 mg/m2 | INTRAVENOUS | Status: DC
  Administered 2021-02-16: 5750 mg via INTRAVENOUS
  Filled 2021-02-16: qty 115

## 2021-02-16 MED ORDER — DEXTROSE 5 % IV SOLN: Freq: Once | INTRAVENOUS | Status: AC

## 2021-02-16 MED ORDER — ATROPINE SULFATE 1 MG/ML IV SOLN
0.5000 mg | Freq: Once | INTRAVENOUS | Status: AC | PRN
  Administered 2021-02-16: 0.5 mg via INTRAVENOUS
  Filled 2021-02-16: qty 1

## 2021-02-16 MED ORDER — PALONOSETRON HCL INJECTION 0.25 MG/5ML
0.2500 mg | Freq: Once | INTRAVENOUS | Status: AC
  Administered 2021-02-16: 0.25 mg via INTRAVENOUS
  Filled 2021-02-16: qty 5

## 2021-02-16 MED ORDER — SODIUM CHLORIDE 0.9 % IV SOLN
165.0000 mg/m2 | Freq: Once | INTRAVENOUS | Status: AC
  Administered 2021-02-16: 400 mg via INTRAVENOUS
  Filled 2021-02-16: qty 15

## 2021-02-16 MED ORDER — OXALIPLATIN CHEMO INJECTION 100 MG/20ML
84.0000 mg/m2 | Freq: Once | INTRAVENOUS | Status: AC
  Administered 2021-02-16: 200 mg via INTRAVENOUS
  Filled 2021-02-16: qty 40

## 2021-02-16 MED ORDER — SODIUM CHLORIDE 0.9 % IV SOLN: Freq: Once | INTRAVENOUS | Status: AC

## 2021-02-16 MED ORDER — SODIUM CHLORIDE 0.9 % IV SOLN
10.0000 mg | Freq: Once | INTRAVENOUS | Status: AC
  Administered 2021-02-16: 10 mg via INTRAVENOUS
  Filled 2021-02-16: qty 10

## 2021-02-16 NOTE — Patient Instructions (Signed)
PICC Home Care Guide A peripherally inserted central catheter (PICC) is a form of IV access that allows medicines and IV fluids to be quickly distributed throughout the body. The PICC is a long, thin, flexible tube (catheter) that is inserted into a vein in the upper arm. The catheter ends in a large vein in the chest (superior vena cava, or SVC). After the PICC is inserted, a chest X-ray may be done to make sure that it is in the correct place. A PICC may be placed for different reasons, such as: To give medicines and liquid nutrition. To give IV fluids and blood products. If there is trouble placing a peripheral intravenous (PIV) catheter. If taken care of properly, a PICC can remain in place for several months. Having a PICC can also allow a person to go home from the hospital sooner. Medicine and PICC care can be managed at home by a family member, caregiver, or home health care team. What are the risks? Generally, having a PICC is safe. However, problems may occur, including: A blood clot (thrombus) forming in or at the tip of the PICC. A blood clot forming in a vein (deep vein thrombosis) or traveling to the lung (pulmonary embolism). Inflammation of the vein (phlebitis) in which the PICC is placed. Infection. Central line associated blood stream infection (CLABSI) is a serious infection that often requires hospitalization. PICC movement (malposition). The PICC tip may move from its original position due to excessive physical activity, forceful coughing, sneezing, or vomiting. A break or cut in the PICC. It is important not to use scissors near the PICC. Nerve or tendon irritation or injury during PICC insertion. How to take care of your PICC Preventing problems You and any caregivers should wash your hands often with soap. Wash hands: Before touching the PICC line or the infusion device. Before changing a bandage (dressing). Flush the PICC as told by your health care provider. Let your  health care provider know right away if the PICC is hard to flush or does not flush. Do not use force to flush the PICC. Do not use a syringe that is less than 10 mL to flush the PICC. Avoid blood pressure checks on the arm in which the PICC is placed. Never pull or tug on the PICC. Do not take the PICC out yourself. Only a trained clinical professional should remove the PICC. Use clean and sterile supplies only. Keep the supplies in a dry place. Do not reuse needles, syringes, or any other supplies. Doing that can lead to infection. Keep pets and children away from your PICC line. Check the PICC insertion site every day for signs of infection. Check for: Leakage. Redness, swelling, or pain. Fluid or blood. Warmth. Pus or a bad smell. PICC dressing care Keep your PICC bandage (dressing) clean and dry to prevent infection. Do not take baths, swim, or use a hot tub until your health care provider approves. Ask your health care provider if you can take showers. You may only be allowed to take sponge baths for bathing. When you are allowed to shower: Ask your health care provider to teach you how to wrap the PICC line. Cover the PICC line with clear plastic wrap and tape to keep it dry while showering. Follow instructions from your health care provider about how to take care of your insertion site and dressing. Make sure you: Wash your hands with soap and water before you change your bandage (dressing). If soap and water are  not available, use hand sanitizer. Change your dressing as told by your health care provider. Leave stitches (sutures), skin glue, or adhesive strips in place. These skin closures may need to stay in place for 2 weeks or longer. If adhesive strip edges start to loosen and curl up, you may trim the loose edges. Do not remove adhesive strips completely unless your health care provider tells you to do that. Change your PICC dressing if it becomes loose or wet. General  instructions  Carry your PICC identification card or wear a medical alert bracelet at all times. Keep the tube clamped at all times, unless it is being used. Carry a smooth-edge clamp with you at all times to place on the tube if it breaks. Do not use scissors or sharp objects near the tube. You may bend your arm and move it freely. If your PICC is near or at the bend of your elbow, avoid activity with repeated motion at the elbow. Avoid lifting heavy objects as told by your health care provider. Keep all follow-up visits as told by your health care provider. This is important. Disposal of supplies Throw away any syringes in a disposal container that is meant for sharp items (sharps container). You can buy a sharps container from a pharmacy, or you can make one by using an empty hard plastic bottle with a cover. Place any used dressings or infusion bags into a plastic bag. Throw that bag in the trash. Contact a health care provider if: You have pain in your arm, ear, face, or teeth. You have a fever or chills. You have redness, swelling, or pain around the insertion site. You have fluid or blood coming from the insertion site. Your insertion site feels warm to the touch. You have pus or a bad smell coming from the insertion site. Your skin feels hard and raised around the insertion site. Get help right away if: Your PICC is accidentally pulled all the way out. If this happens, cover the insertion site with a bandage or gauze dressing. Do not throw the PICC away. Your health care provider will need to check it. Your PICC was tugged or pulled and has partially come out. Do not  push the PICC back in. You cannot flush the PICC, it is hard to flush, or the PICC leaks around the insertion site when it is flushed. You hear a "flushing" sound when the PICC is flushed. You feel your heart racing or skipping beats. There is a hole or tear in the PICC. You have swelling in the arm in which the PICC  was inserted. You have a red streak going up your arm from where the PICC was inserted. Summary A peripherally inserted central catheter (PICC) is a long, thin, flexible tube (catheter) that is inserted into a vein in the upper arm. The PICC is inserted using a sterile technique by a specially trained nurse or physician. Only a trained clinical professional should remove it. Keep your PICC identification card with you at all times. Avoid blood pressure checks on the arm in which the PICC is placed. If cared for properly, a PICC can remain in place for several months. Having a PICC can also allow a person to go home from the hospital sooner. This information is not intended to replace advice given to you by your health care provider. Make sure you discuss any questions you have with your health care provider. Document Revised: 08/16/2019 Document Reviewed: 08/20/2019 Elsevier Patient Education  2022  [employer].

## 2021-02-18 ENCOUNTER — Other Ambulatory Visit: Payer: Self-pay

## 2021-02-18 ENCOUNTER — Inpatient Hospital Stay: Payer: 59

## 2021-02-18 VITALS — BP 135/89 | HR 88 | Temp 98.0°F | Resp 16

## 2021-02-18 DIAGNOSIS — C180 Malignant neoplasm of cecum: Secondary | ICD-10-CM

## 2021-02-18 DIAGNOSIS — Z5111 Encounter for antineoplastic chemotherapy: Secondary | ICD-10-CM | POA: Diagnosis not present

## 2021-02-18 MED ORDER — HEPARIN SOD (PORK) LOCK FLUSH 100 UNIT/ML IV SOLN
500.0000 [IU] | Freq: Once | INTRAVENOUS | Status: AC | PRN
  Administered 2021-02-18: 250 [IU]

## 2021-02-18 MED ORDER — SODIUM CHLORIDE 0.9% FLUSH
10.0000 mL | INTRAVENOUS | Status: DC | PRN
  Administered 2021-02-18: 10 mL

## 2021-02-18 MED ORDER — HEPARIN SOD (PORK) LOCK FLUSH 100 UNIT/ML IV SOLN
250.0000 [IU] | Freq: Once | INTRAVENOUS | Status: AC | PRN
  Administered 2021-02-18: 250 [IU]

## 2021-02-18 NOTE — Patient Instructions (Signed)
[facility]  Discharge Instructions: Thank you for choosing [facility] to provide your oncology and hematology care.   If you have a lab appointment with the [facility], please go directly to the [facility] and check in at the registration area.  Wear comfortable clothing and clothing appropriate for easy access to any Portacath or PICC line.   We strive to give you quality time with your provider. You may need to reschedule your appointment if you arrive late (15 or more minutes).  Arriving late affects you and other patients whose appointments are after yours.  Also, if you miss three or more appointments without notifying the office, you may be dismissed from the clinic at the provider's discretion.      For prescription refill requests, have your pharmacy contact our office and allow 72 hours for refills to be completed.    Today you received the following chemotherapy and/or immunotherapy agents 5FU      To help prevent nausea and vomiting after your treatment, we encourage you to take your nausea medication as directed.  BELOW ARE SYMPTOMS THAT SHOULD BE REPORTED IMMEDIATELY: *FEVER GREATER THAN 100.4 F (38 C) OR HIGHER *CHILLS OR SWEATING *NAUSEA AND VOMITING THAT IS NOT CONTROLLED WITH YOUR NAUSEA MEDICATION *UNUSUAL SHORTNESS OF BREATH *UNUSUAL BRUISING OR BLEEDING *URINARY PROBLEMS (pain or burning when urinating, or frequent urination) *BOWEL PROBLEMS (unusual diarrhea, constipation, pain near the anus) TENDERNESS IN MOUTH AND THROAT WITH OR WITHOUT PRESENCE OF ULCERS (sore throat, sores in mouth, or a toothache) UNUSUAL RASH, SWELLING OR PAIN  UNUSUAL VAGINAL DISCHARGE OR ITCHING   Items with * indicate a potential emergency and should be followed up as soon as possible or go to the Emergency Department if any problems should occur.  Please show the CHEMOTHERAPY ALERT CARD or IMMUNOTHERAPY ALERT CARD at check-in to the  Emergency Department and triage nurse. Should you have questions after your visit or need to cancel or reschedule your appointment, please contact [facility]  [phone] and follow the prompts.  Office hours are 8:00 a.m. to 4:30 p.m. Monday - Friday. Please note that voicemails left after 4:00 p.m. may not be returned until the following business day.  We are closed weekends and major holidays. You have access to a nurse at all times for urgent questions. Please call the main number to the clinic [phone] and follow the prompts.  For any non-urgent questions, you may also contact your provider using MyChart. We now offer e-Visits for anyone 55 and older to request care online for non-urgent symptoms. For details visit [email or web address].   Also download the MyChart app! Go to the app store, search "MyChart", open the app, select [facility], and log in with your MyChart username and password.  Due to Covid, a mask is required upon entering the hospital/clinic. If you do not have a mask, one will be given to you upon arrival. For doctor visits, patients may have 1 support person aged 55 or older with them. For treatment visits, patients cannot have anyone with them due to current Covid guidelines and our immunocompromised population.

## 2021-02-21 ENCOUNTER — Other Ambulatory Visit: Payer: Self-pay

## 2021-02-21 ENCOUNTER — Inpatient Hospital Stay: Payer: 59

## 2021-02-21 DIAGNOSIS — Z452 Encounter for adjustment and management of vascular access device: Secondary | ICD-10-CM

## 2021-02-21 DIAGNOSIS — Z5111 Encounter for antineoplastic chemotherapy: Secondary | ICD-10-CM | POA: Diagnosis not present

## 2021-02-21 MED ORDER — SODIUM CHLORIDE 0.9% FLUSH
3.0000 mL | Freq: Once | INTRAVENOUS | Status: AC
  Administered 2021-02-21: 3 mL via INTRAVENOUS

## 2021-02-21 MED ORDER — HEPARIN SOD (PORK) LOCK FLUSH 100 UNIT/ML IV SOLN
250.0000 [IU] | Freq: Once | INTRAVENOUS | Status: AC
  Administered 2021-02-21: 250 [IU] via INTRAVENOUS

## 2021-02-21 MED ORDER — HEPARIN SOD (PORK) LOCK FLUSH 100 UNIT/ML IV SOLN
250.0000 [IU] | Freq: Once | INTRAVENOUS | Status: AC
Start: 2021-02-21 — End: 2021-02-21
  Administered 2021-02-21: 250 [IU] via INTRAVENOUS

## 2021-02-21 NOTE — Patient Instructions (Signed)
Tunneled Central Venous Catheter Flushing Guide It is important to flush your tunneled central venous catheter each time you use it, both before and after you use it. Flushing your catheter will help prevent it from clogging. What are the risks? Risks may include: Infection. Air getting into the catheter and bloodstream. Supplies needed: A clean pair of gloves. A disinfecting wipe. Use an alcohol wipe, chlorhexidine wipe, or iodine wipe as told by your health care provider. A 10 mL syringe that has been prefilled with saline solution. An empty 10 mL syringe, if a substance called heparin was injected into your catheter. How to flush your catheter When you flush your catheter, make sure you follow any specific instructions from your health care provider or the manufacturer. These are general guidelines. Flushing your catheter before use If there is heparin in your catheter: Wash your hands with soap and water. Put on gloves. Scrub the injection cap for a minimum of 15 seconds with a disinfecting wipe. Unclamp the catheter. Attach the empty syringe to the injection cap. Pull the syringe plunger back and withdraw 10 mL of blood. Place the syringe into an appropriate waste container. Scrub the injection cap for 15 seconds with a disinfecting wipe. Attach the prefilled syringe to the injection cap. Flush the catheter by pushing the plunger forward until all the liquid from the syringe is in the catheter. Remove the syringe from the injection cap. Clamp the catheter. If there is no heparin in your catheter: Wash your hands with soap and water. Put on gloves. Scrub the injection cap for 15 seconds with a disinfecting wipe. Unclamp the catheter. Attach the prefilled syringe to the injection cap. Flush the catheter by pushing the plunger forward until 5 mL of the liquid from the syringe is in the catheter. Pull back on the syringe until you see blood in the catheter. If you have been asked  to collect any blood, follow your health care provider's instructions. Otherwise, flush the catheter with the rest of the solution from the syringe. Remove the syringe from the injection cap. Clamp the catheter.  Flushing your catheter after use Wash your hands with soap and water. Put on gloves. Scrub the injection cap for 15 seconds with a disinfecting wipe. Unclamp the catheter. Attach the prefilled syringe to the injection cap. Flush the catheter by pushing the plunger forward until all of the liquid from the syringe is in the catheter. Remove the syringe from the injection cap. Clamp the catheter. Problems and solutions If blood cannot be completely cleared from the injection cap, you may need to have the injection cap replaced. If the catheter is difficult to flush, use the pulsing method. The pulsing method involves pushing only a few milliliters of solution into the catheter at a time and pausing between pushes. If you do not see blood in the catheter when you pull back on the syringe, change your body position, such as by raising your arms above your head. Take a deep breath and cough. Then, pull back on the syringe. If you still do not see blood, flush the catheter with a small amount of solution. Then, change positions again and take a breath or cough. Pull back on the syringe again. If you still do not see blood, finish flushing the catheter and contact your health care provider. Do not use your catheter until your health care provider says it is okay. General tips Have someone help you flush your catheter, if possible. Do not force fluid   through your catheter. Do not use a syringe that is larger or smaller than 10 mL. Using a smaller syringe can make the catheter burst. Do not use your catheter without flushing it first if it has heparin in it. Contact a health care provider if: You cannot see any blood in the catheter when you flush it before using it. Your catheter is difficult  to flush. Get help right away if: You cannot flush the catheter. The catheter leaks when you flush it or when there is fluid in it. There are cracks or breaks in the catheter. Summary It is important to flush your tunneled central venous catheter each time you use it, both before and after you use it. Scrub the injection cap for 15 seconds with a disinfecting wipe before and after you flush it. When you flush your catheter, make sure you follow any specific instructions from your health care provider or the manufacturer. Get help right away if you cannot flush the catheter. This information is not intended to replace advice given to you by your health care provider. Make sure you discuss any questions you have with your health care provider. Document Revised: 06/19/2019 Document Reviewed: 06/26/2018 Elsevier Patient Education  2022 Elsevier Inc.  

## 2021-02-23 ENCOUNTER — Other Ambulatory Visit: Payer: Self-pay

## 2021-02-23 ENCOUNTER — Inpatient Hospital Stay: Payer: 59 | Attending: Hematology & Oncology

## 2021-02-23 VITALS — BP 124/91 | HR 110 | Temp 97.8°F | Resp 20

## 2021-02-23 DIAGNOSIS — Z5111 Encounter for antineoplastic chemotherapy: Secondary | ICD-10-CM | POA: Insufficient documentation

## 2021-02-23 DIAGNOSIS — C787 Secondary malignant neoplasm of liver and intrahepatic bile duct: Secondary | ICD-10-CM | POA: Diagnosis not present

## 2021-02-23 DIAGNOSIS — Z452 Encounter for adjustment and management of vascular access device: Secondary | ICD-10-CM | POA: Insufficient documentation

## 2021-02-23 DIAGNOSIS — C180 Malignant neoplasm of cecum: Secondary | ICD-10-CM | POA: Insufficient documentation

## 2021-02-23 DIAGNOSIS — Z5189 Encounter for other specified aftercare: Secondary | ICD-10-CM | POA: Diagnosis not present

## 2021-02-23 MED ORDER — HEPARIN SOD (PORK) LOCK FLUSH 100 UNIT/ML IV SOLN
250.0000 [IU] | Freq: Once | INTRAVENOUS | Status: AC
  Administered 2021-02-23: 250 [IU]

## 2021-02-23 MED ORDER — SODIUM CHLORIDE 0.9% FLUSH
10.0000 mL | Freq: Once | INTRAVENOUS | Status: AC
  Administered 2021-02-23: 10 mL

## 2021-02-25 ENCOUNTER — Other Ambulatory Visit: Payer: Self-pay

## 2021-02-25 ENCOUNTER — Inpatient Hospital Stay: Payer: 59

## 2021-02-25 VITALS — BP 131/82 | HR 96 | Temp 98.2°F | Resp 18

## 2021-02-25 DIAGNOSIS — C180 Malignant neoplasm of cecum: Secondary | ICD-10-CM

## 2021-02-25 DIAGNOSIS — Z5111 Encounter for antineoplastic chemotherapy: Secondary | ICD-10-CM | POA: Diagnosis not present

## 2021-02-25 MED ORDER — HEPARIN SOD (PORK) LOCK FLUSH 100 UNIT/ML IV SOLN
500.0000 [IU] | Freq: Once | INTRAVENOUS | Status: AC
  Administered 2021-02-25: 500 [IU] via INTRAVENOUS

## 2021-02-25 MED ORDER — SODIUM CHLORIDE 0.9% FLUSH
10.0000 mL | Freq: Once | INTRAVENOUS | Status: AC
  Administered 2021-02-25: 20 mL via INTRAVENOUS

## 2021-02-25 NOTE — Patient Instructions (Signed)
Tunneled Central Venous Catheter Flushing Guide It is important to flush your tunneled central venous catheter each time you use it, both before and after you use it. Flushing your catheter will help prevent it from clogging. What are the risks? Risks may include: Infection. Air getting into the catheter and bloodstream. Supplies needed: A clean pair of gloves. A disinfecting wipe. Use an alcohol wipe, chlorhexidine wipe, or iodine wipe as told by your health care provider. A 10 mL syringe that has been prefilled with saline solution. An empty 10 mL syringe, if a substance called heparin was injected into your catheter. How to flush your catheter When you flush your catheter, make sure you follow any specific instructions from your health care provider or the manufacturer. These are general guidelines. Flushing your catheter before use If there is heparin in your catheter: Wash your hands with soap and water. Put on gloves. Scrub the injection cap for a minimum of 15 seconds with a disinfecting wipe. Unclamp the catheter. Attach the empty syringe to the injection cap. Pull the syringe plunger back and withdraw 10 mL of blood. Place the syringe into an appropriate waste container. Scrub the injection cap for 15 seconds with a disinfecting wipe. Attach the prefilled syringe to the injection cap. Flush the catheter by pushing the plunger forward until all the liquid from the syringe is in the catheter. Remove the syringe from the injection cap. Clamp the catheter. If there is no heparin in your catheter: Wash your hands with soap and water. Put on gloves. Scrub the injection cap for 15 seconds with a disinfecting wipe. Unclamp the catheter. Attach the prefilled syringe to the injection cap. Flush the catheter by pushing the plunger forward until 5 mL of the liquid from the syringe is in the catheter. Pull back on the syringe until you see blood in the catheter. If you have been asked  to collect any blood, follow your health care provider's instructions. Otherwise, flush the catheter with the rest of the solution from the syringe. Remove the syringe from the injection cap. Clamp the catheter.  Flushing your catheter after use Wash your hands with soap and water. Put on gloves. Scrub the injection cap for 15 seconds with a disinfecting wipe. Unclamp the catheter. Attach the prefilled syringe to the injection cap. Flush the catheter by pushing the plunger forward until all of the liquid from the syringe is in the catheter. Remove the syringe from the injection cap. Clamp the catheter. Problems and solutions If blood cannot be completely cleared from the injection cap, you may need to have the injection cap replaced. If the catheter is difficult to flush, use the pulsing method. The pulsing method involves pushing only a few milliliters of solution into the catheter at a time and pausing between pushes. If you do not see blood in the catheter when you pull back on the syringe, change your body position, such as by raising your arms above your head. Take a deep breath and cough. Then, pull back on the syringe. If you still do not see blood, flush the catheter with a small amount of solution. Then, change positions again and take a breath or cough. Pull back on the syringe again. If you still do not see blood, finish flushing the catheter and contact your health care provider. Do not use your catheter until your health care provider says it is okay. General tips Have someone help you flush your catheter, if possible. Do not force fluid   through your catheter. Do not use a syringe that is larger or smaller than 10 mL. Using a smaller syringe can make the catheter burst. Do not use your catheter without flushing it first if it has heparin in it. Contact a health care provider if: You cannot see any blood in the catheter when you flush it before using it. Your catheter is difficult  to flush. Get help right away if: You cannot flush the catheter. The catheter leaks when you flush it or when there is fluid in it. There are cracks or breaks in the catheter. Summary It is important to flush your tunneled central venous catheter each time you use it, both before and after you use it. Scrub the injection cap for 15 seconds with a disinfecting wipe before and after you flush it. When you flush your catheter, make sure you follow any specific instructions from your health care provider or the manufacturer. Get help right away if you cannot flush the catheter. This information is not intended to replace advice given to you by your health care provider. Make sure you discuss any questions you have with your health care provider. Document Revised: 06/19/2019 Document Reviewed: 06/26/2018 Elsevier Patient Education  2022 Elsevier Inc.  

## 2021-02-28 ENCOUNTER — Inpatient Hospital Stay: Payer: 59

## 2021-02-28 ENCOUNTER — Other Ambulatory Visit: Payer: Self-pay

## 2021-02-28 DIAGNOSIS — Z452 Encounter for adjustment and management of vascular access device: Secondary | ICD-10-CM

## 2021-02-28 DIAGNOSIS — Z5111 Encounter for antineoplastic chemotherapy: Secondary | ICD-10-CM | POA: Diagnosis not present

## 2021-02-28 MED ORDER — SODIUM CHLORIDE 0.9% FLUSH
10.0000 mL | Freq: Once | INTRAVENOUS | Status: AC
  Administered 2021-02-28: 3 mL via INTRAVENOUS

## 2021-02-28 MED ORDER — HEPARIN SOD (PORK) LOCK FLUSH 100 UNIT/ML IV SOLN
250.0000 [IU] | Freq: Once | INTRAVENOUS | Status: AC
  Administered 2021-02-28: 250 [IU] via INTRAVENOUS

## 2021-02-28 NOTE — Patient Instructions (Signed)

## 2021-03-02 ENCOUNTER — Inpatient Hospital Stay: Payer: 59

## 2021-03-02 ENCOUNTER — Other Ambulatory Visit: Payer: Self-pay

## 2021-03-02 ENCOUNTER — Other Ambulatory Visit: Payer: Self-pay | Admitting: Family

## 2021-03-02 ENCOUNTER — Encounter: Payer: Self-pay | Admitting: Family

## 2021-03-02 ENCOUNTER — Inpatient Hospital Stay (HOSPITAL_BASED_OUTPATIENT_CLINIC_OR_DEPARTMENT_OTHER): Payer: 59 | Admitting: Family

## 2021-03-02 VITALS — BP 142/93 | HR 84 | Temp 98.0°F | Resp 18 | Wt 273.0 lb

## 2021-03-02 DIAGNOSIS — C180 Malignant neoplasm of cecum: Secondary | ICD-10-CM

## 2021-03-02 DIAGNOSIS — Z5111 Encounter for antineoplastic chemotherapy: Secondary | ICD-10-CM | POA: Diagnosis not present

## 2021-03-02 LAB — CBC WITH DIFFERENTIAL (CANCER CENTER ONLY)
Abs Immature Granulocytes: 0 10*3/uL (ref 0.00–0.07)
Basophils Absolute: 0 10*3/uL (ref 0.0–0.1)
Basophils Relative: 1 %
Eosinophils Absolute: 0 10*3/uL (ref 0.0–0.5)
Eosinophils Relative: 1 %
HCT: 35.5 % — ABNORMAL LOW (ref 36.0–46.0)
Hemoglobin: 11.4 g/dL — ABNORMAL LOW (ref 12.0–15.0)
Immature Granulocytes: 0 %
Lymphocytes Relative: 48 %
Lymphs Abs: 1.3 10*3/uL (ref 0.7–4.0)
MCH: 30 pg (ref 26.0–34.0)
MCHC: 32.1 g/dL (ref 30.0–36.0)
MCV: 93.4 fL (ref 80.0–100.0)
Monocytes Absolute: 0.4 10*3/uL (ref 0.1–1.0)
Monocytes Relative: 14 %
Neutro Abs: 1 10*3/uL — ABNORMAL LOW (ref 1.7–7.7)
Neutrophils Relative %: 36 %
Platelet Count: 164 10*3/uL (ref 150–400)
RBC: 3.8 MIL/uL — ABNORMAL LOW (ref 3.87–5.11)
RDW: 16 % — ABNORMAL HIGH (ref 11.5–15.5)
WBC Count: 2.6 10*3/uL — ABNORMAL LOW (ref 4.0–10.5)
nRBC: 0 % (ref 0.0–0.2)

## 2021-03-02 LAB — CMP (CANCER CENTER ONLY)
ALT: 25 U/L (ref 0–44)
AST: 22 U/L (ref 15–41)
Albumin: 3.6 g/dL (ref 3.5–5.0)
Alkaline Phosphatase: 69 U/L (ref 38–126)
Anion gap: 9 (ref 5–15)
BUN: 10 mg/dL (ref 6–20)
CO2: 29 mmol/L (ref 22–32)
Calcium: 9.3 mg/dL (ref 8.9–10.3)
Chloride: 105 mmol/L (ref 98–111)
Creatinine: 0.81 mg/dL (ref 0.44–1.00)
GFR, Estimated: >60 mL/min (ref >60)
Glucose, Bld: 117 mg/dL — ABNORMAL HIGH (ref 70–99)
Potassium: 3.4 mmol/L — ABNORMAL LOW (ref 3.5–5.1)
Sodium: 143 mmol/L (ref 135–145)
Total Bilirubin: 0.4 mg/dL (ref 0.3–1.2)
Total Protein: 6.1 g/dL — ABNORMAL LOW (ref 6.5–8.1)

## 2021-03-02 LAB — TOTAL PROTEIN, URINE DIPSTICK: Protein, ur: NEGATIVE mg/dL

## 2021-03-02 LAB — CEA (IN HOUSE-CHCC): CEA (CHCC-In House): 4.71 ng/mL (ref 0.00–5.00)

## 2021-03-02 MED ORDER — SODIUM CHLORIDE 0.9% FLUSH
10.0000 mL | Freq: Once | INTRAVENOUS | Status: AC
  Administered 2021-03-02: 10 mL

## 2021-03-02 MED ORDER — OXALIPLATIN CHEMO INJECTION 100 MG/20ML
84.0000 mg/m2 | Freq: Once | INTRAVENOUS | Status: AC
  Administered 2021-03-02: 200 mg via INTRAVENOUS
  Filled 2021-03-02: qty 40

## 2021-03-02 MED ORDER — SODIUM CHLORIDE 0.9 % IV SOLN: Freq: Once | INTRAVENOUS | Status: DC

## 2021-03-02 MED ORDER — SODIUM CHLORIDE 0.9 % IV SOLN
150.0000 mg | Freq: Once | INTRAVENOUS | Status: AC
  Administered 2021-03-02: 150 mg via INTRAVENOUS
  Filled 2021-03-02: qty 150

## 2021-03-02 MED ORDER — SODIUM CHLORIDE 0.9 % IV SOLN
165.0000 mg/m2 | Freq: Once | INTRAVENOUS | Status: AC
  Administered 2021-03-02: 400 mg via INTRAVENOUS
  Filled 2021-03-02: qty 15

## 2021-03-02 MED ORDER — LEUCOVORIN CALCIUM INJECTION 350 MG
400.0000 mg/m2 | Freq: Once | INTRAVENOUS | Status: AC
  Administered 2021-03-02: 956 mg via INTRAVENOUS
  Filled 2021-03-02: qty 47.8

## 2021-03-02 MED ORDER — SODIUM CHLORIDE 0.9 % IV SOLN
10.0000 mg | Freq: Once | INTRAVENOUS | Status: AC
  Administered 2021-03-02: 10 mg via INTRAVENOUS
  Filled 2021-03-02: qty 10

## 2021-03-02 MED ORDER — DEXTROSE 5 % IV SOLN: Freq: Once | INTRAVENOUS | Status: AC

## 2021-03-02 MED ORDER — SODIUM CHLORIDE 0.9 % IV SOLN
2400.0000 mg/m2 | INTRAVENOUS | Status: DC
  Administered 2021-03-02: 5750 mg via INTRAVENOUS
  Filled 2021-03-02: qty 115

## 2021-03-02 MED ORDER — PALONOSETRON HCL INJECTION 0.25 MG/5ML: 0.2500 mg | Freq: Once | INTRAVENOUS | Status: DC

## 2021-03-02 MED ORDER — ATROPINE SULFATE 1 MG/ML IV SOLN
0.5000 mg | Freq: Once | INTRAVENOUS | Status: AC | PRN
  Administered 2021-03-02: 0.5 mg via INTRAVENOUS

## 2021-03-02 MED ORDER — PALONOSETRON HCL INJECTION 0.25 MG/5ML
0.2500 mg | Freq: Once | INTRAVENOUS | Status: AC
  Administered 2021-03-02: 0.25 mg via INTRAVENOUS

## 2021-03-02 NOTE — Patient Instructions (Signed)
PICC Home Care Guide A peripherally inserted central catheter (PICC) is a form of IV access that allows medicines and IV fluids to be quickly put into the blood and spread throughout the body. The PICC is a long, thin, flexible tube (catheter) that is put into a vein in a person's arm or leg. The catheter ends in a large vein just outside the heart called the superior vena cava (SVC). After the PICC is put in, a chest X-ray may be done to make sure that it is in the right place. A PICC may be placed for different reasons, such as: To give medicines and liquid nutrition. To give IV fluids and blood products. To take blood samples often. If there is trouble placing a peripheral intravenous (PIV) catheter. If cared for properly, a PICC can remain in place for many months. Having a PICC can allow you to go home from the hospital sooner and continue treatment at home. Medicines and PICC care can be managed at home by a family member, caregiver, or home health care team. What are the risks? Generally, having a PICC is safe. However, problems may occur, including: A blood clot (thrombus) forming in or at the end of the PICC. A blood clot forming in a vein (deep vein thrombosis) or traveling to the lung (pulmonary embolism). Inflammation of the vein (phlebitis) in which the PICC is placed. Infection at the insertion site or in the blood. Blood infections from central lines, like PICCs, can be serious and often require a hospital stay. PICC malposition, or PICC movement or poor placement. A break or cut in the PICC. Do not use scissors near the PICC. Nerve or tendon irritation or injury during PICC insertion. How to care for your PICC Please follow the specific guidelines provided by your health care provider. Preventing infection You and any caregivers should wash your hands often with soap and water for at least 20 seconds. Wash hands: Before touching the PICC or the infusion device. Before changing a  bandage (dressing). Do not change the dressing unless you have been taught to do so and have shown you are able to change it safely. Flush the PICC as told. Tell your health care provider right away if the PICC is hard to flush or does not flush. Do not use force to flush the PICC. Use clean and germ-free (sterile) supplies only. Keep the supplies in a dry place. Do not reuse needles, syringes, or any other supplies. Reusing supplies can lead to infection. Keep the PICC dressing dry and secure it with tape if the edges stop sticking to your skin. Check your PICC insertion site every day for signs of infection. Check for: Redness, swelling, or pain. Fluid or blood. Warmth. Pus or a bad smell. Preventing other problems Do not use a syringe that is less than 10 mL to flush the PICC. Do not have your blood pressure checked on the arm in which the PICC is placed. Do not ever pull or tug on the PICC. Keep it secured to your arm with tape or a stretch wrap when not in use. Do not take the PICC out yourself. Only a trained health care provider should remove the PICC. Keep pets and children away from your PICC. How to care for your PICC dressing Keep your PICC dressing clean and dry to prevent infection. Do not take baths, swim, or use a hot tub until your health care provider approves. Ask your health care provider if you can take   showers. You may only be allowed to take sponge baths. When you are allowed to shower: Ask your health care provider to teach you how to wrap the PICC. Cover the PICC with clear plastic wrap and tape to keep it dry while showering. Follow instructions from your health care provider about how to take care of your insertion site and dressing. Make sure you: Wash your hands with soap and water for at least 20 seconds before and after you change your dressing. If soap and water are not available, use hand sanitizer. Change your dressing only if taught to do so by your health care  provider. Your PICC dressing needs to be changed if it becomes loose or wet. Leave stitches (sutures), skin glue, or adhesive strips in place. These skin closures may need to stay in place for 2 weeks or longer. If adhesive strip edges start to loosen and curl up, you may trim the loose edges. Do not remove adhesive strips completely unless your health care provider tells you to do that. Follow these instructions at home: Disposal of supplies Throw away any syringes in a disposal container that is meant for sharp items (sharps container). You can buy a sharps container from a pharmacy, or you can make one by using an empty, hard plastic bottle with a lid. Place any used dressings or infusion bags into a plastic bag. Throw that bag in the trash. General instructions  Always carry your PICC identification card or wear a medical alert bracelet. Keep the tube clamped at all times, unless it is being used. Always carry a smooth-edge clamp with you to clamp the PICC if it breaks. Do not use scissors or sharp objects near the tube. You may bend your arm and move it freely. If your PICC is near or at the bend of your elbow, avoid activity with repeated motion at the elbow. Avoid lifting heavy objects as told by your health care provider. Keep all follow-up visits. This is important. You will need to have your PICC dressing changed at least once a week. Contact a health care provider if: You have pain in your arm, ear, face, or teeth. You have a fever or chills. You have redness, swelling, or pain around the insertion site. You have fluid or blood coming from the insertion site. Your insertion site feels warm to the touch. You have pus or a bad smell coming from the insertion site. Your skin feels hard and raised around the insertion site. Your PICC dressing has gotten wet or is coming off and you have not been taught how to change it. Get help right away if: You have problems with your PICC, such as  your PICC: Was tugged or pulled and has partially come out. Do not  push the PICC back in. Cannot be flushed, is hard to flush, or leaks around the insertion site when it is flushed. Makes a flushing sound when it is flushed. Appears to have a hole or tear. Is accidentally pulled all the way out. If this happens, cover the insertion site with a gauze dressing. Do not throw the PICC away. Your health care provider will need to check it to be sure the entire catheter came out. You feel your heart racing or skipping beats, or you have chest pain. You have shortness of breath or trouble breathing. You have swelling, redness, warmth, or pain in the arm in which the PICC is placed. You have a red streak going up your arm that   starts under the PICC dressing. These symptoms may be an emergency. Get help right away. Call 911. Do not wait to see if the symptoms will go away. Do not drive yourself to the hospital. Summary A peripherally inserted central catheter (PICC) is a long, thin, flexible tube (catheter) that is put into a vein in the arm or leg. If cared for properly, a PICC can remain in place for many months. Having a PICC can allow you to go home from the hospital sooner and continue treatment at home. The PICC is inserted using a germ-free (sterile) technique by a specially trained health care provider. Only a trained health care provider should remove it. Do not have your blood pressure checked on the arm in which your PICC is placed. Always keep your PICC identification card with you. This information is not intended to replace advice given to you by your health care provider. Make sure you discuss any questions you have with your health care provider. Document Revised: 10/27/2020 Document Reviewed: 10/27/2020 Elsevier Patient Education  2022 [employer].  

## 2021-03-02 NOTE — Patient Instructions (Signed)
[facility]  Discharge Instructions: Thank you for choosing [facility] to provide your oncology and hematology care.   If you have a lab appointment with the [facility], please go directly to the [facility] and check in at the registration area.  Wear comfortable clothing and clothing appropriate for easy access to any Portacath or PICC line.   We strive to give you quality time with your provider. You may need to reschedule your appointment if you arrive late (15 or more minutes).  Arriving late affects you and other patients whose appointments are after yours.  Also, if you miss three or more appointments without notifying the office, you may be dismissed from the clinic at the provider's discretion.      For prescription refill requests, have your pharmacy contact our office and allow 72 hours for refills to be completed.    Today you received the following chemotherapy and/or immunotherapy agents Oxaliplatin/Irinotecan/Leucovorin/5 FU       To help prevent nausea and vomiting after your treatment, we encourage you to take your nausea medication as directed.  BELOW ARE SYMPTOMS THAT SHOULD BE REPORTED IMMEDIATELY: *FEVER GREATER THAN 100.4 F (38 C) OR HIGHER *CHILLS OR SWEATING *NAUSEA AND VOMITING THAT IS NOT CONTROLLED WITH YOUR NAUSEA MEDICATION *UNUSUAL SHORTNESS OF BREATH *UNUSUAL BRUISING OR BLEEDING *URINARY PROBLEMS (pain or burning when urinating, or frequent urination) *BOWEL PROBLEMS (unusual diarrhea, constipation, pain near the anus) TENDERNESS IN MOUTH AND THROAT WITH OR WITHOUT PRESENCE OF ULCERS (sore throat, sores in mouth, or a toothache) UNUSUAL RASH, SWELLING OR PAIN  UNUSUAL VAGINAL DISCHARGE OR ITCHING   Items with * indicate a potential emergency and should be followed up as soon as possible or go to the Emergency Department if any problems should occur.  Please show the CHEMOTHERAPY ALERT CARD or IMMUNOTHERAPY  ALERT CARD at check-in to the Emergency Department and triage nurse. Should you have questions after your visit or need to cancel or reschedule your appointment, please contact [facility]  [phone] and follow the prompts.  Office hours are 8:00 a.m. to 4:30 p.m. Monday - Friday. Please note that voicemails left after 4:00 p.m. may not be returned until the following business day.  We are closed weekends and major holidays. You have access to a nurse at all times for urgent questions. Please call the main number to the clinic [phone] and follow the prompts.  For any non-urgent questions, you may also contact your provider using MyChart. We now offer e-Visits for anyone 72 and older to request care online for non-urgent symptoms. For details visit [email or web address].   Also download the MyChart app! Go to the app store, search "MyChart", open the app, select [facility], and log in with your MyChart username and password.  Due to Covid, a mask is required upon entering the hospital/clinic. If you do not have a mask, one will be given to you upon arrival. For doctor visits, patients may have 1 support person aged 54 or older with them. For treatment visits, patients cannot have anyone with them due to current Covid guidelines and our immunocompromised population.

## 2021-03-02 NOTE — Progress Notes (Signed)
Hematology and Oncology Follow Up Visit  ARLEY GARANT [id number] 1965-12-23 55 y.o. 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 daily-start on 01/22/2021   Interim History:  Ms. Petrides is here today for follow-up and treatment. She is doing well but notes small hyperpigmented pumps on her forehead, sides of her face, neck , chest and back. There is no pain, itching or burning with these.  She had 1 episode of diarrhea last week but this resolved once she took imodium.  She has not noted any blood loss noted bruising or petechiae.  CEA last month was 5.04. Today's result is pending.  WBC count is 2.6, ANC 1.0, monocytes 14%. Hgb 11.4, MCV 93 and platelets 164.  She has had no issue with infection. No fever, chills, n/v, cough, dizziness, SOB, chest pain, palpitations, abdominal pain or changes in bladder habits.  No swelling or tenderness in her extremities.  She has intermittent numbness and tingling in her feet that is unchanged.  No falls or syncope to report.  She has maintained a good appetite and is staying well hydrated. Her weight is stable at 273 lbs.   ECOG Performance Status: 1 - Symptomatic but completely ambulatory  Medications:  Allergies as of 03/02/2021   No Known Allergies      Medication List        Accurate as of March 02, 2021 10:40 AM. If you have any questions, ask your nurse or doctor.          dexamethasone 6 MG tablet Commonly known as: DECADRON Take 6 mg by mouth See admin instructions. Take 6 mg by mouth daily for 3 days, starting the day after chemotherapy. Take with food.   loperamide 2 MG tablet Commonly known as: Imodium A-D Take 2 at onset of diarrhea, then 1 every 2hrs until 12hr without a BM. May take 2  tab every 4hrs at bedtime. If diarrhea recurs repeat. What changed:  how much to take how to take this when to take this additional instructions   ondansetron 8 MG tablet Commonly known as: Zofran Take 1 tablet (8 mg total) by mouth 2 (two) times daily as needed. Start on day 3 after chemotherapy. What changed:  when to take this additional instructions   prochlorperazine 10 MG tablet Commonly known as: COMPAZINE Take 1 tablet (10 mg total) by mouth every 6 (six) hours as needed (Nausea or vomiting). What changed: reasons to take this   Rivaroxaban Stater Pack (15 mg and 20 mg) Commonly known as: XARELTO STARTER PACK Follow package directions: Take one 85m tablet by mouth twice a day. On day 22, switch to one 214mtablet once a day. Take with food.   rivaroxaban 20 MG Tabs tablet Commonly known as: XARELTO Take 1 tablet (20 mg total) by mouth daily with supper.        Allergies: No Known Allergies  Past Medical History, Surgical history, Social history, and Family History were reviewed and updated.  Review of Systems: All other 10 point review of systems is negative.   Physical Exam:  weight is 273 lb (123.8 kg). Her oral temperature is 98 F (36.7 C). Her blood pressure is 142/93 (abnormal) and her pulse is 84. Her respiration is 18 and  oxygen saturation is 98%.   Wt Readings from Last 3 Encounters:  03/02/21 273 lb (123.8 kg)  02/09/21 271 lb (122.9 kg)  01/26/21 278 lb 1.3 oz (126.1 kg)    Ocular: Sclerae unicteric, pupils equal, round and reactive to light Ear-nose-throat: Oropharynx clear, dentition fair Lymphatic: No cervical or supraclavicular adenopathy Lungs no rales or rhonchi, good excursion bilaterally Heart regular rate and rhythm, no murmur appreciated Abd soft, nontender, positive bowel sounds, no liver or spleen tip palpated on exam, no fluid wave  MSK no focal spinal tenderness, no joint edema Neuro: non-focal, well-oriented, appropriate  affect Breasts: Deferred   Lab Results  Component Value Date   WBC 2.6 (L) 03/02/2021   HGB 11.4 (L) 03/02/2021   HCT 35.5 (L) 03/02/2021   MCV 93.4 03/02/2021   PLT 164 03/02/2021   Lab Results  Component Value Date   FERRITIN 176 01/04/2021   IRON 69 01/04/2021   TIBC 244 (L) 01/04/2021   UIBC 175 01/04/2021   IRONPCTSAT 28 01/04/2021   Lab Results  Component Value Date   RETICCTPCT 3.0 01/04/2021   RBC 3.80 (L) 03/02/2021   No results found for: KPAFRELGTCHN, LAMBDASER, KAPLAMBRATIO No results found for: IGGSERUM, IGA, IGMSERUM No results found for: Ronnald Ramp, A1GS, A2GS, Tillman Sers, SPEI   Chemistry      Component Value Date/Time   NA 142 02/16/2021 0740   K 3.6 02/16/2021 0740   CL 105 02/16/2021 0740   CO2 29 02/16/2021 0740   BUN 8 02/16/2021 0740   CREATININE 0.75 02/16/2021 0740      Component Value Date/Time   CALCIUM 9.5 02/16/2021 0740   ALKPHOS 65 02/16/2021 0740   AST 23 02/16/2021 0740   ALT 23 02/16/2021 0740   BILITOT 0.3 02/16/2021 0740       Impression and Plan: Ms. Hornsby is a very pleasant 28 African American female with metastatic adenocarcinoma of the cecum.  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 CEA pending.  Ok to proceed with cycle 7 today per MD. We will add Neulasta to her treatment day 3 to help prevent the neutropenia.  We will plan to repeat scans after cycle 8.  Follow-up in 2 weeks.  She can contact our office with any questions or concerns.   Lottie Dawson, NP 11/9/202210:40 AM

## 2021-03-02 NOTE — Progress Notes (Signed)
Ok to treat with ANC 1.0 per Dr. Marin Olp.

## 2021-03-04 ENCOUNTER — Other Ambulatory Visit: Payer: Self-pay

## 2021-03-04 ENCOUNTER — Inpatient Hospital Stay: Payer: 59

## 2021-03-04 ENCOUNTER — Encounter: Payer: Self-pay | Admitting: Hematology & Oncology

## 2021-03-04 ENCOUNTER — Other Ambulatory Visit: Payer: Self-pay | Admitting: *Deleted

## 2021-03-04 ENCOUNTER — Telehealth: Payer: Self-pay | Admitting: *Deleted

## 2021-03-04 VITALS — BP 137/88 | HR 90 | Temp 98.0°F | Resp 18

## 2021-03-04 DIAGNOSIS — Z5111 Encounter for antineoplastic chemotherapy: Secondary | ICD-10-CM | POA: Diagnosis not present

## 2021-03-04 DIAGNOSIS — C180 Malignant neoplasm of cecum: Secondary | ICD-10-CM

## 2021-03-04 MED ORDER — PEGFILGRASTIM 6 MG/0.6ML ~~LOC~~ PSKT: 6.0000 mg | PREFILLED_SYRINGE | Freq: Once | SUBCUTANEOUS | Status: DC

## 2021-03-04 MED ORDER — PEGFILGRASTIM INJECTION 6 MG/0.6ML ~~LOC~~
6.0000 mg | PREFILLED_SYRINGE | Freq: Once | SUBCUTANEOUS | Status: DC
  Filled 2021-03-04: qty 0.6

## 2021-03-04 MED ORDER — HEPARIN SOD (PORK) LOCK FLUSH 100 UNIT/ML IV SOLN
250.0000 [IU] | Freq: Once | INTRAVENOUS | Status: AC | PRN
  Administered 2021-03-04: 250 [IU]

## 2021-03-04 MED ORDER — PEGFILGRASTIM INJECTION 6 MG/0.6ML ~~LOC~~
6.0000 mg | PREFILLED_SYRINGE | Freq: Once | SUBCUTANEOUS | Status: AC
  Administered 2021-03-04: 6 mg via SUBCUTANEOUS
  Filled 2021-03-04: qty 0.6

## 2021-03-04 MED ORDER — SODIUM CHLORIDE 0.9% FLUSH
3.0000 mL | INTRAVENOUS | Status: DC | PRN
  Administered 2021-03-04: 3 mL

## 2021-03-04 NOTE — Telephone Encounter (Signed)
Per 03/02/21 los gave upcoming appointment - confirmed - print calendar

## 2021-03-07 ENCOUNTER — Inpatient Hospital Stay: Payer: 59

## 2021-03-07 ENCOUNTER — Other Ambulatory Visit: Payer: Self-pay | Admitting: *Deleted

## 2021-03-07 ENCOUNTER — Other Ambulatory Visit: Payer: Self-pay

## 2021-03-07 VITALS — BP 111/75 | HR 85 | Temp 98.0°F | Resp 20

## 2021-03-07 DIAGNOSIS — Z452 Encounter for adjustment and management of vascular access device: Secondary | ICD-10-CM

## 2021-03-07 DIAGNOSIS — C180 Malignant neoplasm of cecum: Secondary | ICD-10-CM

## 2021-03-07 DIAGNOSIS — Z5111 Encounter for antineoplastic chemotherapy: Secondary | ICD-10-CM | POA: Diagnosis not present

## 2021-03-07 MED ORDER — SODIUM CHLORIDE 0.9% FLUSH
10.0000 mL | Freq: Once | INTRAVENOUS | Status: AC
  Administered 2021-03-07: 10 mL

## 2021-03-07 MED ORDER — HEPARIN SOD (PORK) LOCK FLUSH 100 UNIT/ML IV SOLN
250.0000 [IU] | Freq: Once | INTRAVENOUS | Status: AC
  Administered 2021-03-07: 250 [IU]

## 2021-03-07 NOTE — Patient Instructions (Signed)

## 2021-03-07 NOTE — Progress Notes (Signed)
Order entered for Picc line;  needing further evaluation from IR or possibly new picc line.

## 2021-03-07 NOTE — Progress Notes (Signed)
Kink noted in picc line before picc line dressing change today. When trying to obtain positive blood return after dressing change moisture noted at antibacterial disc. Slight blood return noted, flushed easily without discomfort, swelling or redness noted at site. [facility] notified and scheduled apt for 03/10/21 @ 8:00 AM for PICC change. Pt advised and agreed to apt.

## 2021-03-08 ENCOUNTER — Telehealth: Payer: Self-pay | Admitting: *Deleted

## 2021-03-08 NOTE — Telephone Encounter (Signed)
Call placed to patient to inform her to not come to PICC line flush tomorrow or Friday d/t pt is going to IR on Thursday, 03/10/21 for PICC line revision and to return as scheduled on Monday, 03/14/21.  Pt is appreciative of call and states that she will call office back after PICC revision if she needs to be seen sooner.

## 2021-03-09 ENCOUNTER — Inpatient Hospital Stay: Payer: 59

## 2021-03-09 ENCOUNTER — Inpatient Hospital Stay: Payer: 59 | Admitting: Hematology & Oncology

## 2021-03-10 ENCOUNTER — Other Ambulatory Visit: Payer: Self-pay

## 2021-03-10 ENCOUNTER — Other Ambulatory Visit: Payer: Self-pay | Admitting: Family

## 2021-03-10 ENCOUNTER — Ambulatory Visit (HOSPITAL_COMMUNITY)
Admission: RE | Admit: 2021-03-10 | Discharge: 2021-03-10 | Disposition: A | Discharge status: Home / Self Care | Payer: 59 | Source: Ambulatory Visit | Attending: Family | Admitting: Family

## 2021-03-10 DIAGNOSIS — Z452 Encounter for adjustment and management of vascular access device: Secondary | ICD-10-CM | POA: Insufficient documentation

## 2021-03-10 MED ORDER — LIDOCAINE HCL 1 % IJ SOLN
INTRAMUSCULAR | Status: AC
  Administered 2021-03-10: 09:00:00 1 mL
  Filled 2021-03-10: qty 20

## 2021-03-10 MED ORDER — HEPARIN SOD (PORK) LOCK FLUSH 100 UNIT/ML IV SOLN
INTRAVENOUS | Status: AC
  Administered 2021-03-10: 09:00:00 500 [IU]
  Filled 2021-03-10: qty 5

## 2021-03-11 ENCOUNTER — Inpatient Hospital Stay: Payer: 59

## 2021-03-14 ENCOUNTER — Inpatient Hospital Stay: Payer: 59

## 2021-03-14 ENCOUNTER — Other Ambulatory Visit: Payer: Self-pay

## 2021-03-14 VITALS — BP 131/98 | HR 91 | Temp 97.7°F | Resp 18

## 2021-03-14 DIAGNOSIS — Z5111 Encounter for antineoplastic chemotherapy: Secondary | ICD-10-CM | POA: Diagnosis not present

## 2021-03-14 DIAGNOSIS — Z452 Encounter for adjustment and management of vascular access device: Secondary | ICD-10-CM

## 2021-03-14 MED ORDER — HEPARIN SOD (PORK) LOCK FLUSH 100 UNIT/ML IV SOLN
500.0000 [IU] | Freq: Once | INTRAVENOUS | Status: AC
  Administered 2021-03-14: 250 [IU] via INTRAVENOUS

## 2021-03-14 MED ORDER — SODIUM CHLORIDE 0.9% FLUSH
10.0000 mL | Freq: Once | INTRAVENOUS | Status: AC
  Administered 2021-03-14: 10 mL via INTRAVENOUS

## 2021-03-14 NOTE — Patient Instructions (Signed)
PICC Home Care Guide A peripherally inserted central catheter (PICC) is a form of IV access that allows medicines and IV fluids to be quickly put into the blood and spread throughout the body. The PICC is a long, thin, flexible tube (catheter) that is put into a vein in a person's arm or leg. The catheter ends in a large vein just outside the heart called the superior vena cava (SVC). After the PICC is put in, a chest X-ray may be done to make sure that it is in the right place. A PICC may be placed for different reasons, such as: To give medicines and liquid nutrition. To give IV fluids and blood products. To take blood samples often. If there is trouble placing a peripheral intravenous (PIV) catheter. If cared for properly, a PICC can remain in place for many months. Having a PICC can allow you to go home from the hospital sooner and continue treatment at home. Medicines and PICC care can be managed at home by a family member, caregiver, or home health care team. What are the risks? Generally, having a PICC is safe. However, problems may occur, including: A blood clot (thrombus) forming in or at the end of the PICC. A blood clot forming in a vein (deep vein thrombosis) or traveling to the lung (pulmonary embolism). Inflammation of the vein (phlebitis) in which the PICC is placed. Infection at the insertion site or in the blood. Blood infections from central lines, like PICCs, can be serious and often require a hospital stay. PICC malposition, or PICC movement or poor placement. A break or cut in the PICC. Do not use scissors near the PICC. Nerve or tendon irritation or injury during PICC insertion. How to care for your PICC Please follow the specific guidelines provided by your health care provider. Preventing infection You and any caregivers should wash your hands often with soap and water for at least 20 seconds. Wash hands: Before touching the PICC or the infusion device. Before changing a  bandage (dressing). Do not change the dressing unless you have been taught to do so and have shown you are able to change it safely. Flush the PICC as told. Tell your health care provider right away if the PICC is hard to flush or does not flush. Do not use force to flush the PICC. Use clean and germ-free (sterile) supplies only. Keep the supplies in a dry place. Do not reuse needles, syringes, or any other supplies. Reusing supplies can lead to infection. Keep the PICC dressing dry and secure it with tape if the edges stop sticking to your skin. Check your PICC insertion site every day for signs of infection. Check for: Redness, swelling, or pain. Fluid or blood. Warmth. Pus or a bad smell. Preventing other problems Do not use a syringe that is less than 10 mL to flush the PICC. Do not have your blood pressure checked on the arm in which the PICC is placed. Do not ever pull or tug on the PICC. Keep it secured to your arm with tape or a stretch wrap when not in use. Do not take the PICC out yourself. Only a trained health care provider should remove the PICC. Keep pets and children away from your PICC. How to care for your PICC dressing Keep your PICC dressing clean and dry to prevent infection. Do not take baths, swim, or use a hot tub until your health care provider approves. Ask your health care provider if you can take   showers. You may only be allowed to take sponge baths. When you are allowed to shower: Ask your health care provider to teach you how to wrap the PICC. Cover the PICC with clear plastic wrap and tape to keep it dry while showering. Follow instructions from your health care provider about how to take care of your insertion site and dressing. Make sure you: Wash your hands with soap and water for at least 20 seconds before and after you change your dressing. If soap and water are not available, use hand sanitizer. Change your dressing only if taught to do so by your health care  provider. Your PICC dressing needs to be changed if it becomes loose or wet. Leave stitches (sutures), skin glue, or adhesive strips in place. These skin closures may need to stay in place for 2 weeks or longer. If adhesive strip edges start to loosen and curl up, you may trim the loose edges. Do not remove adhesive strips completely unless your health care provider tells you to do that. Follow these instructions at home: Disposal of supplies Throw away any syringes in a disposal container that is meant for sharp items (sharps container). You can buy a sharps container from a pharmacy, or you can make one by using an empty, hard plastic bottle with a lid. Place any used dressings or infusion bags into a plastic bag. Throw that bag in the trash. General instructions  Always carry your PICC identification card or wear a medical alert bracelet. Keep the tube clamped at all times, unless it is being used. Always carry a smooth-edge clamp with you to clamp the PICC if it breaks. Do not use scissors or sharp objects near the tube. You may bend your arm and move it freely. If your PICC is near or at the bend of your elbow, avoid activity with repeated motion at the elbow. Avoid lifting heavy objects as told by your health care provider. Keep all follow-up visits. This is important. You will need to have your PICC dressing changed at least once a week. Contact a health care provider if: You have pain in your arm, ear, face, or teeth. You have a fever or chills. You have redness, swelling, or pain around the insertion site. You have fluid or blood coming from the insertion site. Your insertion site feels warm to the touch. You have pus or a bad smell coming from the insertion site. Your skin feels hard and raised around the insertion site. Your PICC dressing has gotten wet or is coming off and you have not been taught how to change it. Get help right away if: You have problems with your PICC, such as  your PICC: Was tugged or pulled and has partially come out. Do not  push the PICC back in. Cannot be flushed, is hard to flush, or leaks around the insertion site when it is flushed. Makes a flushing sound when it is flushed. Appears to have a hole or tear. Is accidentally pulled all the way out. If this happens, cover the insertion site with a gauze dressing. Do not throw the PICC away. Your health care provider will need to check it to be sure the entire catheter came out. You feel your heart racing or skipping beats, or you have chest pain. You have shortness of breath or trouble breathing. You have swelling, redness, warmth, or pain in the arm in which the PICC is placed. You have a red streak going up your arm that   starts under the PICC dressing. These symptoms may be an emergency. Get help right away. Call 911. Do not wait to see if the symptoms will go away. Do not drive yourself to the hospital. Summary A peripherally inserted central catheter (PICC) is a long, thin, flexible tube (catheter) that is put into a vein in the arm or leg. If cared for properly, a PICC can remain in place for many months. Having a PICC can allow you to go home from the hospital sooner and continue treatment at home. The PICC is inserted using a germ-free (sterile) technique by a specially trained health care provider. Only a trained health care provider should remove it. Do not have your blood pressure checked on the arm in which your PICC is placed. Always keep your PICC identification card with you. This information is not intended to replace advice given to you by your health care provider. Make sure you discuss any questions you have with your health care provider. Document Revised: 10/27/2020 Document Reviewed: 10/27/2020 Elsevier Patient Education  2022 [employer].  

## 2021-03-16 ENCOUNTER — Ambulatory Visit: Payer: 59

## 2021-03-16 ENCOUNTER — Ambulatory Visit: Payer: 59 | Admitting: Family

## 2021-03-16 ENCOUNTER — Other Ambulatory Visit: Payer: 59

## 2021-03-16 ENCOUNTER — Inpatient Hospital Stay: Payer: 59

## 2021-03-16 ENCOUNTER — Other Ambulatory Visit: Payer: Self-pay

## 2021-03-16 VITALS — BP 119/75 | HR 85 | Temp 97.9°F | Resp 17

## 2021-03-16 DIAGNOSIS — Z452 Encounter for adjustment and management of vascular access device: Secondary | ICD-10-CM

## 2021-03-16 DIAGNOSIS — Z5111 Encounter for antineoplastic chemotherapy: Secondary | ICD-10-CM | POA: Diagnosis not present

## 2021-03-16 MED ORDER — HEPARIN SOD (PORK) LOCK FLUSH 100 UNIT/ML IV SOLN
500.0000 [IU] | Freq: Once | INTRAVENOUS | Status: AC
  Administered 2021-03-16: 500 [IU] via INTRAVENOUS

## 2021-03-16 MED ORDER — SODIUM CHLORIDE 0.9% FLUSH
10.0000 mL | Freq: Once | INTRAVENOUS | Status: AC
  Administered 2021-03-16: 10 mL via INTRAVENOUS

## 2021-03-21 ENCOUNTER — Other Ambulatory Visit: Payer: Self-pay

## 2021-03-21 ENCOUNTER — Inpatient Hospital Stay: Payer: 59

## 2021-03-21 VITALS — BP 136/87 | HR 86

## 2021-03-21 DIAGNOSIS — Z452 Encounter for adjustment and management of vascular access device: Secondary | ICD-10-CM

## 2021-03-21 DIAGNOSIS — Z5111 Encounter for antineoplastic chemotherapy: Secondary | ICD-10-CM | POA: Diagnosis not present

## 2021-03-21 MED ORDER — SODIUM CHLORIDE 0.9% FLUSH
10.0000 mL | Freq: Once | INTRAVENOUS | Status: AC
  Administered 2021-03-21: 10:00:00 10 mL via INTRAVENOUS

## 2021-03-21 MED ORDER — HEPARIN SOD (PORK) LOCK FLUSH 100 UNIT/ML IV SOLN
250.0000 [IU] | Freq: Once | INTRAVENOUS | Status: AC
  Administered 2021-03-21: 10:00:00 250 [IU] via INTRAVENOUS

## 2021-03-21 MED ORDER — DEXAMETHASONE 6 MG PO TABS: 6.0000 mg | ORAL_TABLET | ORAL | 0 refills | Status: DC

## 2021-03-21 NOTE — Patient Instructions (Signed)
PICC Home Care Guide A peripherally inserted central catheter (PICC) is a form of IV access that allows medicines and IV fluids to be quickly put into the blood and spread throughout the body. The PICC is a long, thin, flexible tube (catheter) that is put into a vein in a person's arm or leg. The catheter ends in a large vein just outside the heart called the superior vena cava (SVC). After the PICC is put in, a chest X-ray may be done to make sure that it is in the right place. A PICC may be placed for different reasons, such as: To give medicines and liquid nutrition. To give IV fluids and blood products. To take blood samples often. If there is trouble placing a peripheral intravenous (PIV) catheter. If cared for properly, a PICC can remain in place for many months. Having a PICC can allow you to go home from the hospital sooner and continue treatment at home. Medicines and PICC care can be managed at home by a family member, caregiver, or home health care team. What are the risks? Generally, having a PICC is safe. However, problems may occur, including: A blood clot (thrombus) forming in or at the end of the PICC. A blood clot forming in a vein (deep vein thrombosis) or traveling to the lung (pulmonary embolism). Inflammation of the vein (phlebitis) in which the PICC is placed. Infection at the insertion site or in the blood. Blood infections from central lines, like PICCs, can be serious and often require a hospital stay. PICC malposition, or PICC movement or poor placement. A break or cut in the PICC. Do not use scissors near the PICC. Nerve or tendon irritation or injury during PICC insertion. How to care for your PICC Please follow the specific guidelines provided by your health care provider. Preventing infection You and any caregivers should wash your hands often with soap and water for at least 20 seconds. Wash hands: Before touching the PICC or the infusion device. Before changing a  bandage (dressing). Do not change the dressing unless you have been taught to do so and have shown you are able to change it safely. Flush the PICC as told. Tell your health care provider right away if the PICC is hard to flush or does not flush. Do not use force to flush the PICC. Use clean and germ-free (sterile) supplies only. Keep the supplies in a dry place. Do not reuse needles, syringes, or any other supplies. Reusing supplies can lead to infection. Keep the PICC dressing dry and secure it with tape if the edges stop sticking to your skin. Check your PICC insertion site every day for signs of infection. Check for: Redness, swelling, or pain. Fluid or blood. Warmth. Pus or a bad smell. Preventing other problems Do not use a syringe that is less than 10 mL to flush the PICC. Do not have your blood pressure checked on the arm in which the PICC is placed. Do not ever pull or tug on the PICC. Keep it secured to your arm with tape or a stretch wrap when not in use. Do not take the PICC out yourself. Only a trained health care provider should remove the PICC. Keep pets and children away from your PICC. How to care for your PICC dressing Keep your PICC dressing clean and dry to prevent infection. Do not take baths, swim, or use a hot tub until your health care provider approves. Ask your health care provider if you can take   showers. You may only be allowed to take sponge baths. When you are allowed to shower: Ask your health care provider to teach you how to wrap the PICC. Cover the PICC with clear plastic wrap and tape to keep it dry while showering. Follow instructions from your health care provider about how to take care of your insertion site and dressing. Make sure you: Wash your hands with soap and water for at least 20 seconds before and after you change your dressing. If soap and water are not available, use hand sanitizer. Change your dressing only if taught to do so by your health care  provider. Your PICC dressing needs to be changed if it becomes loose or wet. Leave stitches (sutures), skin glue, or adhesive strips in place. These skin closures may need to stay in place for 2 weeks or longer. If adhesive strip edges start to loosen and curl up, you may trim the loose edges. Do not remove adhesive strips completely unless your health care provider tells you to do that. Follow these instructions at home: Disposal of supplies Throw away any syringes in a disposal container that is meant for sharp items (sharps container). You can buy a sharps container from a pharmacy, or you can make one by using an empty, hard plastic bottle with a lid. Place any used dressings or infusion bags into a plastic bag. Throw that bag in the trash. General instructions  Always carry your PICC identification card or wear a medical alert bracelet. Keep the tube clamped at all times, unless it is being used. Always carry a smooth-edge clamp with you to clamp the PICC if it breaks. Do not use scissors or sharp objects near the tube. You may bend your arm and move it freely. If your PICC is near or at the bend of your elbow, avoid activity with repeated motion at the elbow. Avoid lifting heavy objects as told by your health care provider. Keep all follow-up visits. This is important. You will need to have your PICC dressing changed at least once a week. Contact a health care provider if: You have pain in your arm, ear, face, or teeth. You have a fever or chills. You have redness, swelling, or pain around the insertion site. You have fluid or blood coming from the insertion site. Your insertion site feels warm to the touch. You have pus or a bad smell coming from the insertion site. Your skin feels hard and raised around the insertion site. Your PICC dressing has gotten wet or is coming off and you have not been taught how to change it. Get help right away if: You have problems with your PICC, such as  your PICC: Was tugged or pulled and has partially come out. Do not  push the PICC back in. Cannot be flushed, is hard to flush, or leaks around the insertion site when it is flushed. Makes a flushing sound when it is flushed. Appears to have a hole or tear. Is accidentally pulled all the way out. If this happens, cover the insertion site with a gauze dressing. Do not throw the PICC away. Your health care provider will need to check it to be sure the entire catheter came out. You feel your heart racing or skipping beats, or you have chest pain. You have shortness of breath or trouble breathing. You have swelling, redness, warmth, or pain in the arm in which the PICC is placed. You have a red streak going up your arm that   starts under the PICC dressing. These symptoms may be an emergency. Get help right away. Call 911. Do not wait to see if the symptoms will go away. Do not drive yourself to the hospital. Summary A peripherally inserted central catheter (PICC) is a long, thin, flexible tube (catheter) that is put into a vein in the arm or leg. If cared for properly, a PICC can remain in place for many months. Having a PICC can allow you to go home from the hospital sooner and continue treatment at home. The PICC is inserted using a germ-free (sterile) technique by a specially trained health care provider. Only a trained health care provider should remove it. Do not have your blood pressure checked on the arm in which your PICC is placed. Always keep your PICC identification card with you. This information is not intended to replace advice given to you by your health care provider. Make sure you discuss any questions you have with your health care provider. Document Revised: 10/27/2020 Document Reviewed: 10/27/2020 Elsevier Patient Education  2022 [employer].  

## 2021-03-23 ENCOUNTER — Encounter: Payer: Self-pay | Admitting: Family

## 2021-03-23 ENCOUNTER — Inpatient Hospital Stay (HOSPITAL_BASED_OUTPATIENT_CLINIC_OR_DEPARTMENT_OTHER): Payer: 59 | Admitting: Family

## 2021-03-23 ENCOUNTER — Inpatient Hospital Stay: Payer: 59

## 2021-03-23 ENCOUNTER — Other Ambulatory Visit: Payer: Self-pay

## 2021-03-23 VITALS — BP 128/80 | HR 86 | Temp 98.1°F | Resp 17 | Ht 63.0 in | Wt 274.0 lb

## 2021-03-23 DIAGNOSIS — C180 Malignant neoplasm of cecum: Secondary | ICD-10-CM | POA: Diagnosis not present

## 2021-03-23 DIAGNOSIS — Z5111 Encounter for antineoplastic chemotherapy: Secondary | ICD-10-CM | POA: Diagnosis not present

## 2021-03-23 LAB — CBC WITH DIFFERENTIAL (CANCER CENTER ONLY)
Abs Immature Granulocytes: 0.03 10*3/uL (ref 0.00–0.07)
Basophils Absolute: 0.1 10*3/uL (ref 0.0–0.1)
Basophils Relative: 1 %
Eosinophils Absolute: 0 10*3/uL (ref 0.0–0.5)
Eosinophils Relative: 1 %
HCT: 35 % — ABNORMAL LOW (ref 36.0–46.0)
Hemoglobin: 11.4 g/dL — ABNORMAL LOW (ref 12.0–15.0)
Immature Granulocytes: 1 %
Lymphocytes Relative: 32 %
Lymphs Abs: 1.7 10*3/uL (ref 0.7–4.0)
MCH: 29.8 pg (ref 26.0–34.0)
MCHC: 32.6 g/dL (ref 30.0–36.0)
MCV: 91.6 fL (ref 80.0–100.0)
Monocytes Absolute: 0.8 10*3/uL (ref 0.1–1.0)
Monocytes Relative: 16 %
Neutro Abs: 2.7 10*3/uL (ref 1.7–7.7)
Neutrophils Relative %: 49 %
Platelet Count: 269 10*3/uL (ref 150–400)
RBC: 3.82 MIL/uL — ABNORMAL LOW (ref 3.87–5.11)
RDW: 16.9 % — ABNORMAL HIGH (ref 11.5–15.5)
WBC Count: 5.4 10*3/uL (ref 4.0–10.5)
nRBC: 0 % (ref 0.0–0.2)

## 2021-03-23 LAB — CMP (CANCER CENTER ONLY)
ALT: 15 U/L (ref 0–44)
AST: 21 U/L (ref 15–41)
Albumin: 3.3 g/dL — ABNORMAL LOW (ref 3.5–5.0)
Alkaline Phosphatase: 75 U/L (ref 38–126)
Anion gap: 7 (ref 5–15)
BUN: 9 mg/dL (ref 6–20)
CO2: 30 mmol/L (ref 22–32)
Calcium: 9.3 mg/dL (ref 8.9–10.3)
Chloride: 106 mmol/L (ref 98–111)
Creatinine: 0.71 mg/dL (ref 0.44–1.00)
GFR, Estimated: >60 mL/min (ref >60)
Glucose, Bld: 116 mg/dL — ABNORMAL HIGH (ref 70–99)
Potassium: 3.7 mmol/L (ref 3.5–5.1)
Sodium: 143 mmol/L (ref 135–145)
Total Bilirubin: 0.4 mg/dL (ref 0.3–1.2)
Total Protein: 5.8 g/dL — ABNORMAL LOW (ref 6.5–8.1)

## 2021-03-23 LAB — CEA (IN HOUSE-CHCC): CEA (CHCC-In House): 7.29 ng/mL — ABNORMAL HIGH (ref 0.00–5.00)

## 2021-03-23 MED ORDER — DEXTROSE 5 % IV SOLN: Freq: Once | INTRAVENOUS | Status: AC

## 2021-03-23 MED ORDER — OXALIPLATIN CHEMO INJECTION 100 MG/20ML
84.0000 mg/m2 | Freq: Once | INTRAVENOUS | Status: AC
  Administered 2021-03-23: 200 mg via INTRAVENOUS
  Filled 2021-03-23: qty 40

## 2021-03-23 MED ORDER — SODIUM CHLORIDE 0.9 % IV SOLN
165.0000 mg/m2 | Freq: Once | INTRAVENOUS | Status: AC
  Administered 2021-03-23: 400 mg via INTRAVENOUS
  Filled 2021-03-23: qty 15

## 2021-03-23 MED ORDER — PALONOSETRON HCL INJECTION 0.25 MG/5ML
0.2500 mg | Freq: Once | INTRAVENOUS | Status: AC
  Administered 2021-03-23: 0.25 mg via INTRAVENOUS
  Filled 2021-03-23: qty 5

## 2021-03-23 MED ORDER — ATROPINE SULFATE 1 MG/ML IV SOLN
0.5000 mg | Freq: Once | INTRAVENOUS | Status: AC | PRN
  Administered 2021-03-23: 0.5 mg via INTRAVENOUS
  Filled 2021-03-23: qty 1

## 2021-03-23 MED ORDER — SODIUM CHLORIDE 0.9 % IV SOLN: Freq: Once | INTRAVENOUS | Status: DC

## 2021-03-23 MED ORDER — SODIUM CHLORIDE 0.9 % IV SOLN
10.0000 mg | Freq: Once | INTRAVENOUS | Status: AC
  Administered 2021-03-23: 10 mg via INTRAVENOUS
  Filled 2021-03-23: qty 10

## 2021-03-23 MED ORDER — SODIUM CHLORIDE 0.9 % IV SOLN
2400.0000 mg/m2 | INTRAVENOUS | Status: DC
  Administered 2021-03-23: 5750 mg via INTRAVENOUS
  Filled 2021-03-23: qty 115

## 2021-03-23 MED ORDER — SODIUM CHLORIDE 0.9 % IV SOLN
150.0000 mg | Freq: Once | INTRAVENOUS | Status: AC
  Administered 2021-03-23: 150 mg via INTRAVENOUS
  Filled 2021-03-23: qty 150

## 2021-03-23 MED ORDER — LEUCOVORIN CALCIUM INJECTION 350 MG
400.0000 mg/m2 | Freq: Once | INTRAVENOUS | Status: AC
  Administered 2021-03-23: 956 mg via INTRAVENOUS
  Filled 2021-03-23: qty 25

## 2021-03-23 MED ORDER — DEXAMETHASONE 6 MG PO TABS: 6.0000 mg | ORAL_TABLET | ORAL | 0 refills | Status: DC

## 2021-03-23 NOTE — Progress Notes (Signed)
Hematology and Oncology Follow Up Visit  CAMALA TALWAR [id number] [birth year] 55 y.o. 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 daily-start on 01/22/2021   Interim History:  Ms. Dudenhoeffer is here today for follow-up and treatment. She is doing quite well and has no complaints at this time.  CEA two weeks ago was down to 4.71.  She denies pain.  No fever, chills, n/v, cough, rash, dizziness, SOB, chest pain, palpitations, abdominal pain or changes in bowel or bladder habits.  No swelling in her extremities.  She notes a little tingling in both feet for a day or two after each cycle of treatment.  No falls or syncope to report.  She has maintained a good appetite and is staying well hydrated. Her weight is stable at 274 lbs.   ECOG Performance Status: 1 - Symptomatic but completely ambulatory  Medications:  Allergies as of 03/23/2021   No Known Allergies      Medication List        Accurate as of March 23, 2021  8:43 AM. If you have any questions, ask your nurse or doctor.          dexamethasone 6 MG tablet Commonly known as: DECADRON Take 1 tablet (6 mg total) by mouth See admin instructions. Take 6 mg by mouth daily for 3 days, starting the day after chemotherapy. Take with food.   loperamide 2 MG tablet Commonly known as: Imodium A-D Take 2 at onset of diarrhea, then 1 every 2hrs until 12hr without a BM. May take 2 tab every 4hrs at bedtime. If diarrhea recurs repeat. What changed:  how much to take how to take this when to take this additional instructions   ondansetron 8 MG tablet Commonly known as: Zofran Take 1 tablet (8 mg total) by mouth 2 (two) times daily as needed. Start on day 3 after chemotherapy. What changed:   when to take this additional instructions   prochlorperazine 10 MG tablet Commonly known as: COMPAZINE Take 1 tablet (10 mg total) by mouth every 6 (six) hours as needed (Nausea or vomiting). What changed: reasons to take this   rivaroxaban 20 MG Tabs tablet Commonly known as: XARELTO Take 1 tablet (20 mg total) by mouth daily with supper. What changed: Another medication with the same name was removed. Continue taking this medication, and follow the directions you see here. Changed by: Lottie Dawson, NP        Allergies: No Known Allergies  Past Medical History, Surgical history, Social history, and Family History were reviewed and updated.  Review of Systems: All other 10 point review of systems is negative.   Physical Exam:  height is 5' 3"  (1.6 m) and weight is 274 lb (124.3 kg). Her oral temperature is 98.1 F (36.7 C). Her blood pressure is 128/80 and her pulse is 86. Her respiration is 17 and oxygen saturation is 100%.   Wt Readings from Last 3 Encounters:  03/23/21 274 lb (124.3 kg)  03/02/21 273 lb (123.8 kg)  02/09/21 271 lb (122.9 kg)    Ocular: Sclerae unicteric, pupils equal, round and reactive to light Ear-nose-throat: Oropharynx clear, dentition fair Lymphatic: No cervical or supraclavicular adenopathy Lungs no rales or rhonchi, good excursion bilaterally Heart regular rate  and rhythm, no murmur appreciated Abd soft, nontender, positive bowel sounds MSK no focal spinal tenderness, no joint edema Neuro: non-focal, well-oriented, appropriate affect Breasts: Deferred   Lab Results  Component Value Date   WBC 5.4 03/23/2021   HGB 11.4 (L) 03/23/2021   HCT 35.0 (L) 03/23/2021   MCV 91.6 03/23/2021   PLT 269 03/23/2021   Lab Results  Component Value Date   FERRITIN 176 01/04/2021   IRON 69 01/04/2021   TIBC 244 (L) 01/04/2021   UIBC 175 01/04/2021   IRONPCTSAT 28 01/04/2021   Lab Results  Component Value Date   RETICCTPCT 3.0 01/04/2021   RBC  3.82 (L) 03/23/2021   No results found for: KPAFRELGTCHN, LAMBDASER, KAPLAMBRATIO No results found for: IGGSERUM, IGA, IGMSERUM No results found for: Ronnald Ramp, A1GS, A2GS, Violet Baldy, MSPIKE, SPEI   Chemistry      Component Value Date/Time   NA 143 03/02/2021 1000   K 3.4 (L) 03/02/2021 1000   CL 105 03/02/2021 1000   CO2 29 03/02/2021 1000   BUN 10 03/02/2021 1000   CREATININE 0.81 03/02/2021 1000      Component Value Date/Time   CALCIUM 9.3 03/02/2021 1000   ALKPHOS 69 03/02/2021 1000   AST 22 03/02/2021 1000   ALT 25 03/02/2021 1000   BILITOT 0.4 03/02/2021 1000       Impression and Plan: Ms. Koman is a very pleasant 24 African American female with metastatic adenocarcinoma of the cecum.  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 CEA pending.  Ok to proceed with cycle 8 today as planned.  We will repeat scans a couple days prior to follow-up in another 2 weeks.  She will contact our office with any questions or concerns.   Lottie Dawson, NP 11/30/20228:43 AM

## 2021-03-23 NOTE — Patient Instructions (Signed)

## 2021-03-23 NOTE — Patient Instructions (Signed)
[facility]  Discharge Instructions: Thank you for choosing [facility] to provide your oncology and hematology care.   If you have a lab appointment with the [facility], please go directly to the [facility] and check in at the registration area.  Wear comfortable clothing and clothing appropriate for easy access to any Portacath or PICC line.   We strive to give you quality time with your provider. You may need to reschedule your appointment if you arrive late (15 or more minutes).  Arriving late affects you and other patients whose appointments are after yours.  Also, if you miss three or more appointments without notifying the office, you may be dismissed from the clinic at the provider's discretion.      For prescription refill requests, have your pharmacy contact our office and allow 72 hours for refills to be completed.    Today you received the following chemotherapy and/or immunotherapy agents Camptosar, Leucovorin, Oxaliplatin, 5FU      To help prevent nausea and vomiting after your treatment, we encourage you to take your nausea medication as directed.  BELOW ARE SYMPTOMS THAT SHOULD BE REPORTED IMMEDIATELY: *FEVER GREATER THAN 100.4 F (38 C) OR HIGHER *CHILLS OR SWEATING *NAUSEA AND VOMITING THAT IS NOT CONTROLLED WITH YOUR NAUSEA MEDICATION *UNUSUAL SHORTNESS OF BREATH *UNUSUAL BRUISING OR BLEEDING *URINARY PROBLEMS (pain or burning when urinating, or frequent urination) *BOWEL PROBLEMS (unusual diarrhea, constipation, pain near the anus) TENDERNESS IN MOUTH AND THROAT WITH OR WITHOUT PRESENCE OF ULCERS (sore throat, sores in mouth, or a toothache) UNUSUAL RASH, SWELLING OR PAIN  UNUSUAL VAGINAL DISCHARGE OR ITCHING   Items with * indicate a potential emergency and should be followed up as soon as possible or go to the Emergency Department if any problems should occur.  Please show the CHEMOTHERAPY ALERT CARD or IMMUNOTHERAPY  ALERT CARD at check-in to the Emergency Department and triage nurse. Should you have questions after your visit or need to cancel or reschedule your appointment, please contact [facility]  [phone] and follow the prompts.  Office hours are 8:00 a.m. to 4:30 p.m. Monday - Friday. Please note that voicemails left after 4:00 p.m. may not be returned until the following business day.  We are closed weekends and major holidays. You have access to a nurse at all times for urgent questions. Please call the main number to the clinic [phone] and follow the prompts.  For any non-urgent questions, you may also contact your provider using MyChart. We now offer e-Visits for anyone 71 and older to request care online for non-urgent symptoms. For details visit [email or web address].   Also download the MyChart app! Go to the app store, search "MyChart", open the app, select [facility], and log in with your MyChart username and password.  Due to Covid, a mask is required upon entering the hospital/clinic. If you do not have a mask, one will be given to you upon arrival. For doctor visits, patients may have 1 support person aged 50 or older with them. For treatment visits, patients cannot have anyone with them due to current Covid guidelines and our immunocompromised population.

## 2021-03-25 ENCOUNTER — Inpatient Hospital Stay: Payer: 59 | Attending: Hematology & Oncology

## 2021-03-25 ENCOUNTER — Other Ambulatory Visit: Payer: Self-pay

## 2021-03-25 VITALS — BP 133/86 | HR 92 | Temp 98.3°F | Resp 18

## 2021-03-25 DIAGNOSIS — I2699 Other pulmonary embolism without acute cor pulmonale: Secondary | ICD-10-CM | POA: Insufficient documentation

## 2021-03-25 DIAGNOSIS — C787 Secondary malignant neoplasm of liver and intrahepatic bile duct: Secondary | ICD-10-CM | POA: Insufficient documentation

## 2021-03-25 DIAGNOSIS — Z7901 Long term (current) use of anticoagulants: Secondary | ICD-10-CM | POA: Diagnosis not present

## 2021-03-25 DIAGNOSIS — Z5111 Encounter for antineoplastic chemotherapy: Secondary | ICD-10-CM | POA: Insufficient documentation

## 2021-03-25 DIAGNOSIS — Z5189 Encounter for other specified aftercare: Secondary | ICD-10-CM | POA: Diagnosis not present

## 2021-03-25 DIAGNOSIS — Z452 Encounter for adjustment and management of vascular access device: Secondary | ICD-10-CM | POA: Diagnosis not present

## 2021-03-25 DIAGNOSIS — C180 Malignant neoplasm of cecum: Secondary | ICD-10-CM | POA: Diagnosis present

## 2021-03-25 MED ORDER — SODIUM CHLORIDE 0.9% FLUSH
10.0000 mL | Freq: Once | INTRAVENOUS | Status: AC
  Administered 2021-03-25: 10 mL via INTRAVENOUS

## 2021-03-25 MED ORDER — HEPARIN SOD (PORK) LOCK FLUSH 100 UNIT/ML IV SOLN
500.0000 [IU] | Freq: Once | INTRAVENOUS | Status: AC
  Administered 2021-03-25: 500 [IU] via INTRAVENOUS

## 2021-03-25 MED ORDER — PEGFILGRASTIM INJECTION 6 MG/0.6ML ~~LOC~~
6.0000 mg | PREFILLED_SYRINGE | Freq: Once | SUBCUTANEOUS | Status: AC
  Administered 2021-03-25: 6 mg via SUBCUTANEOUS
  Filled 2021-03-25: qty 0.6

## 2021-03-25 NOTE — Patient Instructions (Signed)
PICC Home Care Guide A peripherally inserted central catheter (PICC) is a form of IV access that allows medicines and IV fluids to be quickly put into the blood and spread throughout the body. The PICC is a long, thin, flexible tube (catheter) that is put into a vein in a person's arm or leg. The catheter ends in a large vein just outside the heart called the superior vena cava (SVC). After the PICC is put in, a chest X-ray may be done to make sure that it is in the right place. A PICC may be placed for different reasons, such as: To give medicines and liquid nutrition. To give IV fluids and blood products. To take blood samples often. If there is trouble placing a peripheral intravenous (PIV) catheter. If cared for properly, a PICC can remain in place for many months. Having a PICC can allow you to go home from the hospital sooner and continue treatment at home. Medicines and PICC care can be managed at home by a family member, caregiver, or home health care team. What are the risks? Generally, having a PICC is safe. However, problems may occur, including: A blood clot (thrombus) forming in or at the end of the PICC. A blood clot forming in a vein (deep vein thrombosis) or traveling to the lung (pulmonary embolism). Inflammation of the vein (phlebitis) in which the PICC is placed. Infection at the insertion site or in the blood. Blood infections from central lines, like PICCs, can be serious and often require a hospital stay. PICC malposition, or PICC movement or poor placement. A break or cut in the PICC. Do not use scissors near the PICC. Nerve or tendon irritation or injury during PICC insertion. How to care for your PICC Please follow the specific guidelines provided by your health care provider. Preventing infection You and any caregivers should wash your hands often with soap and water for at least 20 seconds. Wash hands: Before touching the PICC or the infusion device. Before changing a  bandage (dressing). Do not change the dressing unless you have been taught to do so and have shown you are able to change it safely. Flush the PICC as told. Tell your health care provider right away if the PICC is hard to flush or does not flush. Do not use force to flush the PICC. Use clean and germ-free (sterile) supplies only. Keep the supplies in a dry place. Do not reuse needles, syringes, or any other supplies. Reusing supplies can lead to infection. Keep the PICC dressing dry and secure it with tape if the edges stop sticking to your skin. Check your PICC insertion site every day for signs of infection. Check for: Redness, swelling, or pain. Fluid or blood. Warmth. Pus or a bad smell. Preventing other problems Do not use a syringe that is less than 10 mL to flush the PICC. Do not have your blood pressure checked on the arm in which the PICC is placed. Do not ever pull or tug on the PICC. Keep it secured to your arm with tape or a stretch wrap when not in use. Do not take the PICC out yourself. Only a trained health care provider should remove the PICC. Keep pets and children away from your PICC. How to care for your PICC dressing Keep your PICC dressing clean and dry to prevent infection. Do not take baths, swim, or use a hot tub until your health care provider approves. Ask your health care provider if you can take   showers. You may only be allowed to take sponge baths. When you are allowed to shower: Ask your health care provider to teach you how to wrap the PICC. Cover the PICC with clear plastic wrap and tape to keep it dry while showering. Follow instructions from your health care provider about how to take care of your insertion site and dressing. Make sure you: Wash your hands with soap and water for at least 20 seconds before and after you change your dressing. If soap and water are not available, use hand sanitizer. Change your dressing only if taught to do so by your health care  provider. Your PICC dressing needs to be changed if it becomes loose or wet. Leave stitches (sutures), skin glue, or adhesive strips in place. These skin closures may need to stay in place for 2 weeks or longer. If adhesive strip edges start to loosen and curl up, you may trim the loose edges. Do not remove adhesive strips completely unless your health care provider tells you to do that. Follow these instructions at home: Disposal of supplies Throw away any syringes in a disposal container that is meant for sharp items (sharps container). You can buy a sharps container from a pharmacy, or you can make one by using an empty, hard plastic bottle with a lid. Place any used dressings or infusion bags into a plastic bag. Throw that bag in the trash. General instructions  Always carry your PICC identification card or wear a medical alert bracelet. Keep the tube clamped at all times, unless it is being used. Always carry a smooth-edge clamp with you to clamp the PICC if it breaks. Do not use scissors or sharp objects near the tube. You may bend your arm and move it freely. If your PICC is near or at the bend of your elbow, avoid activity with repeated motion at the elbow. Avoid lifting heavy objects as told by your health care provider. Keep all follow-up visits. This is important. You will need to have your PICC dressing changed at least once a week. Contact a health care provider if: You have pain in your arm, ear, face, or teeth. You have a fever or chills. You have redness, swelling, or pain around the insertion site. You have fluid or blood coming from the insertion site. Your insertion site feels warm to the touch. You have pus or a bad smell coming from the insertion site. Your skin feels hard and raised around the insertion site. Your PICC dressing has gotten wet or is coming off and you have not been taught how to change it. Get help right away if: You have problems with your PICC, such as  your PICC: Was tugged or pulled and has partially come out. Do not  push the PICC back in. Cannot be flushed, is hard to flush, or leaks around the insertion site when it is flushed. Makes a flushing sound when it is flushed. Appears to have a hole or tear. Is accidentally pulled all the way out. If this happens, cover the insertion site with a gauze dressing. Do not throw the PICC away. Your health care provider will need to check it to be sure the entire catheter came out. You feel your heart racing or skipping beats, or you have chest pain. You have shortness of breath or trouble breathing. You have swelling, redness, warmth, or pain in the arm in which the PICC is placed. You have a red streak going up your arm that   starts under the PICC dressing. These symptoms may be an emergency. Get help right away. Call 911. Do not wait to see if the symptoms will go away. Do not drive yourself to the hospital. Summary A peripherally inserted central catheter (PICC) is a long, thin, flexible tube (catheter) that is put into a vein in the arm or leg. If cared for properly, a PICC can remain in place for many months. Having a PICC can allow you to go home from the hospital sooner and continue treatment at home. The PICC is inserted using a germ-free (sterile) technique by a specially trained health care provider. Only a trained health care provider should remove it. Do not have your blood pressure checked on the arm in which your PICC is placed. Always keep your PICC identification card with you. This information is not intended to replace advice given to you by your health care provider. Make sure you discuss any questions you have with your health care provider. Document Revised: 10/27/2020 Document Reviewed: 10/27/2020 Elsevier Patient Education  2022 [employer].  

## 2021-03-28 ENCOUNTER — Other Ambulatory Visit: Payer: Self-pay

## 2021-03-28 ENCOUNTER — Inpatient Hospital Stay: Payer: 59

## 2021-03-28 VITALS — BP 128/84 | HR 89 | Temp 98.3°F | Resp 18

## 2021-03-28 DIAGNOSIS — Z5111 Encounter for antineoplastic chemotherapy: Secondary | ICD-10-CM | POA: Diagnosis not present

## 2021-03-28 DIAGNOSIS — C180 Malignant neoplasm of cecum: Secondary | ICD-10-CM

## 2021-03-28 MED ORDER — HEPARIN SOD (PORK) LOCK FLUSH 100 UNIT/ML IV SOLN
500.0000 [IU] | Freq: Once | INTRAVENOUS | Status: AC
  Administered 2021-03-28: 250 [IU]

## 2021-03-28 MED ORDER — SODIUM CHLORIDE 0.9% FLUSH
10.0000 mL | Freq: Once | INTRAVENOUS | Status: AC
  Administered 2021-03-28: 10 mL

## 2021-03-28 NOTE — Patient Instructions (Signed)

## 2021-03-30 ENCOUNTER — Inpatient Hospital Stay: Payer: 59

## 2021-03-30 ENCOUNTER — Other Ambulatory Visit: Payer: Self-pay

## 2021-03-30 DIAGNOSIS — C180 Malignant neoplasm of cecum: Secondary | ICD-10-CM

## 2021-03-30 DIAGNOSIS — Z5111 Encounter for antineoplastic chemotherapy: Secondary | ICD-10-CM | POA: Diagnosis not present

## 2021-03-30 MED ORDER — HEPARIN SOD (PORK) LOCK FLUSH 100 UNIT/ML IV SOLN
500.0000 [IU] | Freq: Once | INTRAVENOUS | Status: AC
  Administered 2021-03-30: 500 [IU]

## 2021-03-30 MED ORDER — HEPARIN SOD (PORK) LOCK FLUSH 100 UNIT/ML IV SOLN: 250.0000 [IU] | Freq: Once | INTRAVENOUS | Status: DC

## 2021-03-30 MED ORDER — SODIUM CHLORIDE 0.9% FLUSH
10.0000 mL | Freq: Once | INTRAVENOUS | Status: AC
  Administered 2021-03-30: 10 mL

## 2021-04-01 ENCOUNTER — Other Ambulatory Visit: Payer: Self-pay | Admitting: Family

## 2021-04-01 ENCOUNTER — Other Ambulatory Visit: Payer: Self-pay

## 2021-04-01 ENCOUNTER — Inpatient Hospital Stay: Payer: 59

## 2021-04-01 VITALS — BP 126/90 | HR 93 | Temp 98.7°F | Resp 17

## 2021-04-01 DIAGNOSIS — Z452 Encounter for adjustment and management of vascular access device: Secondary | ICD-10-CM

## 2021-04-01 DIAGNOSIS — Z5111 Encounter for antineoplastic chemotherapy: Secondary | ICD-10-CM | POA: Diagnosis not present

## 2021-04-01 MED ORDER — SODIUM CHLORIDE 0.9% FLUSH
10.0000 mL | Freq: Once | INTRAVENOUS | Status: AC
  Administered 2021-04-01: 10 mL via INTRAVENOUS

## 2021-04-01 MED ORDER — HEPARIN SOD (PORK) LOCK FLUSH 100 UNIT/ML IV SOLN
500.0000 [IU] | Freq: Once | INTRAVENOUS | Status: AC
  Administered 2021-04-01: 500 [IU] via INTRAVENOUS

## 2021-04-04 ENCOUNTER — Inpatient Hospital Stay: Payer: 59

## 2021-04-04 ENCOUNTER — Other Ambulatory Visit: Payer: Self-pay

## 2021-04-04 VITALS — BP 138/89 | HR 83 | Temp 98.2°F | Resp 18

## 2021-04-04 DIAGNOSIS — C180 Malignant neoplasm of cecum: Secondary | ICD-10-CM

## 2021-04-04 DIAGNOSIS — Z5111 Encounter for antineoplastic chemotherapy: Secondary | ICD-10-CM | POA: Diagnosis not present

## 2021-04-04 MED ORDER — HEPARIN SOD (PORK) LOCK FLUSH 100 UNIT/ML IV SOLN
500.0000 [IU] | Freq: Once | INTRAVENOUS | Status: AC
  Administered 2021-04-04: 250 [IU]

## 2021-04-04 MED ORDER — SODIUM CHLORIDE 0.9% FLUSH
10.0000 mL | Freq: Once | INTRAVENOUS | Status: AC
  Administered 2021-04-04: 10 mL

## 2021-04-05 ENCOUNTER — Ambulatory Visit: Payer: 59

## 2021-04-05 ENCOUNTER — Other Ambulatory Visit: Payer: 59

## 2021-04-05 ENCOUNTER — Ambulatory Visit: Payer: 59 | Admitting: Family

## 2021-04-06 ENCOUNTER — Inpatient Hospital Stay: Payer: 59

## 2021-04-06 ENCOUNTER — Other Ambulatory Visit: Payer: Self-pay

## 2021-04-06 VITALS — BP 130/80 | HR 78 | Temp 98.2°F | Resp 18

## 2021-04-06 DIAGNOSIS — Z5111 Encounter for antineoplastic chemotherapy: Secondary | ICD-10-CM | POA: Diagnosis not present

## 2021-04-06 DIAGNOSIS — C180 Malignant neoplasm of cecum: Secondary | ICD-10-CM

## 2021-04-06 MED ORDER — HEPARIN SOD (PORK) LOCK FLUSH 100 UNIT/ML IV SOLN
250.0000 [IU] | Freq: Once | INTRAVENOUS | Status: AC
  Administered 2021-04-06: 10:00:00 250 [IU]

## 2021-04-06 MED ORDER — SODIUM CHLORIDE 0.9% FLUSH
10.0000 mL | Freq: Once | INTRAVENOUS | Status: AC
  Administered 2021-04-06: 10:00:00 10 mL

## 2021-04-06 NOTE — Patient Instructions (Signed)

## 2021-04-08 ENCOUNTER — Inpatient Hospital Stay: Payer: 59

## 2021-04-08 ENCOUNTER — Other Ambulatory Visit: Payer: Self-pay

## 2021-04-08 VITALS — BP 123/81 | HR 94 | Temp 98.0°F | Resp 17

## 2021-04-08 DIAGNOSIS — Z5111 Encounter for antineoplastic chemotherapy: Secondary | ICD-10-CM | POA: Diagnosis not present

## 2021-04-08 DIAGNOSIS — C180 Malignant neoplasm of cecum: Secondary | ICD-10-CM

## 2021-04-08 MED ORDER — SODIUM CHLORIDE 0.9% FLUSH
10.0000 mL | INTRAVENOUS | Status: AC | PRN
  Administered 2021-04-08: 10 mL via INTRAVENOUS

## 2021-04-08 MED ORDER — HEPARIN SOD (PORK) LOCK FLUSH 100 UNIT/ML IV SOLN
500.0000 [IU] | Freq: Once | INTRAVENOUS | Status: AC
  Administered 2021-04-08: 500 [IU] via INTRAVENOUS

## 2021-04-08 NOTE — Patient Instructions (Signed)
PICC Home Care Guide A peripherally inserted central catheter (PICC) is a form of IV access that allows medicines and IV fluids to be quickly put into the blood and spread throughout the body. The PICC is a long, thin, flexible tube (catheter) that is put into a vein in a person's arm or leg. The catheter ends in a large vein just outside the heart called the superior vena cava (SVC). After the PICC is put in, a chest X-ray may be done to make sure that it is in the right place. A PICC may be placed for different reasons, such as: To give medicines and liquid nutrition. To give IV fluids and blood products. To take blood samples often. If there is trouble placing a peripheral intravenous (PIV) catheter. If cared for properly, a PICC can remain in place for many months. Having a PICC can allow you to go home from the hospital sooner and continue treatment at home. Medicines and PICC care can be managed at home by a family member, caregiver, or home health care team. What are the risks? Generally, having a PICC is safe. However, problems may occur, including: A blood clot (thrombus) forming in or at the end of the PICC. A blood clot forming in a vein (deep vein thrombosis) or traveling to the lung (pulmonary embolism). Inflammation of the vein (phlebitis) in which the PICC is placed. Infection at the insertion site or in the blood. Blood infections from central lines, like PICCs, can be serious and often require a hospital stay. PICC malposition, or PICC movement or poor placement. A break or cut in the PICC. Do not use scissors near the PICC. Nerve or tendon irritation or injury during PICC insertion. How to care for your PICC Please follow the specific guidelines provided by your health care provider. Preventing infection You and any caregivers should wash your hands often with soap and water for at least 20 seconds. Wash hands: Before touching the PICC or the infusion device. Before changing a  bandage (dressing). Do not change the dressing unless you have been taught to do so and have shown you are able to change it safely. Flush the PICC as told. Tell your health care provider right away if the PICC is hard to flush or does not flush. Do not use force to flush the PICC. Use clean and germ-free (sterile) supplies only. Keep the supplies in a dry place. Do not reuse needles, syringes, or any other supplies. Reusing supplies can lead to infection. Keep the PICC dressing dry and secure it with tape if the edges stop sticking to your skin. Check your PICC insertion site every day for signs of infection. Check for: Redness, swelling, or pain. Fluid or blood. Warmth. Pus or a bad smell. Preventing other problems Do not use a syringe that is less than 10 mL to flush the PICC. Do not have your blood pressure checked on the arm in which the PICC is placed. Do not ever pull or tug on the PICC. Keep it secured to your arm with tape or a stretch wrap when not in use. Do not take the PICC out yourself. Only a trained health care provider should remove the PICC. Keep pets and children away from your PICC. How to care for your PICC dressing Keep your PICC dressing clean and dry to prevent infection. Do not take baths, swim, or use a hot tub until your health care provider approves. Ask your health care provider if you can take   showers. You may only be allowed to take sponge baths. When you are allowed to shower: Ask your health care provider to teach you how to wrap the PICC. Cover the PICC with clear plastic wrap and tape to keep it dry while showering. Follow instructions from your health care provider about how to take care of your insertion site and dressing. Make sure you: Wash your hands with soap and water for at least 20 seconds before and after you change your dressing. If soap and water are not available, use hand sanitizer. Change your dressing only if taught to do so by your health care  provider. Your PICC dressing needs to be changed if it becomes loose or wet. Leave stitches (sutures), skin glue, or adhesive strips in place. These skin closures may need to stay in place for 2 weeks or longer. If adhesive strip edges start to loosen and curl up, you may trim the loose edges. Do not remove adhesive strips completely unless your health care provider tells you to do that. Follow these instructions at home: Disposal of supplies Throw away any syringes in a disposal container that is meant for sharp items (sharps container). You can buy a sharps container from a pharmacy, or you can make one by using an empty, hard plastic bottle with a lid. Place any used dressings or infusion bags into a plastic bag. Throw that bag in the trash. General instructions  Always carry your PICC identification card or wear a medical alert bracelet. Keep the tube clamped at all times, unless it is being used. Always carry a smooth-edge clamp with you to clamp the PICC if it breaks. Do not use scissors or sharp objects near the tube. You may bend your arm and move it freely. If your PICC is near or at the bend of your elbow, avoid activity with repeated motion at the elbow. Avoid lifting heavy objects as told by your health care provider. Keep all follow-up visits. This is important. You will need to have your PICC dressing changed at least once a week. Contact a health care provider if: You have pain in your arm, ear, face, or teeth. You have a fever or chills. You have redness, swelling, or pain around the insertion site. You have fluid or blood coming from the insertion site. Your insertion site feels warm to the touch. You have pus or a bad smell coming from the insertion site. Your skin feels hard and raised around the insertion site. Your PICC dressing has gotten wet or is coming off and you have not been taught how to change it. Get help right away if: You have problems with your PICC, such as  your PICC: Was tugged or pulled and has partially come out. Do not  push the PICC back in. Cannot be flushed, is hard to flush, or leaks around the insertion site when it is flushed. Makes a flushing sound when it is flushed. Appears to have a hole or tear. Is accidentally pulled all the way out. If this happens, cover the insertion site with a gauze dressing. Do not throw the PICC away. Your health care provider will need to check it to be sure the entire catheter came out. You feel your heart racing or skipping beats, or you have chest pain. You have shortness of breath or trouble breathing. You have swelling, redness, warmth, or pain in the arm in which the PICC is placed. You have a red streak going up your arm that   starts under the PICC dressing. These symptoms may be an emergency. Get help right away. Call 911. Do not wait to see if the symptoms will go away. Do not drive yourself to the hospital. Summary A peripherally inserted central catheter (PICC) is a long, thin, flexible tube (catheter) that is put into a vein in the arm or leg. If cared for properly, a PICC can remain in place for many months. Having a PICC can allow you to go home from the hospital sooner and continue treatment at home. The PICC is inserted using a germ-free (sterile) technique by a specially trained health care provider. Only a trained health care provider should remove it. Do not have your blood pressure checked on the arm in which your PICC is placed. Always keep your PICC identification card with you. This information is not intended to replace advice given to you by your health care provider. Make sure you discuss any questions you have with your health care provider. Document Revised: 10/27/2020 Document Reviewed: 10/27/2020 Elsevier Patient Education  2022 [employer].  

## 2021-04-08 NOTE — Addendum Note (Signed)
Addended by: Randolm Idol on: 04/08/2021 10:14 AM   Modules accepted: Orders

## 2021-04-09 ENCOUNTER — Ambulatory Visit (HOSPITAL_BASED_OUTPATIENT_CLINIC_OR_DEPARTMENT_OTHER)
Admission: RE | Admit: 2021-04-09 | Discharge: 2021-04-09 | Disposition: A | Discharge status: Home / Self Care | Payer: 59 | Source: Ambulatory Visit | Attending: Family | Admitting: Family

## 2021-04-09 DIAGNOSIS — C180 Malignant neoplasm of cecum: Secondary | ICD-10-CM

## 2021-04-09 MED ORDER — GADOBUTROL 1 MMOL/ML IV SOLN
10.0000 mL | Freq: Once | INTRAVENOUS | Status: AC | PRN
  Administered 2021-04-09: 10 mL via INTRAVENOUS

## 2021-04-11 ENCOUNTER — Other Ambulatory Visit: Payer: Self-pay

## 2021-04-11 ENCOUNTER — Inpatient Hospital Stay: Payer: 59

## 2021-04-11 VITALS — BP 121/80 | HR 100 | Temp 98.0°F | Resp 17

## 2021-04-11 DIAGNOSIS — Z5111 Encounter for antineoplastic chemotherapy: Secondary | ICD-10-CM | POA: Diagnosis not present

## 2021-04-11 DIAGNOSIS — Z452 Encounter for adjustment and management of vascular access device: Secondary | ICD-10-CM

## 2021-04-11 MED ORDER — SODIUM CHLORIDE 0.9% FLUSH
10.0000 mL | Freq: Once | INTRAVENOUS | Status: AC
  Administered 2021-04-11: 10:00:00 10 mL via INTRAVENOUS

## 2021-04-11 MED ORDER — HEPARIN SOD (PORK) LOCK FLUSH 100 UNIT/ML IV SOLN
250.0000 [IU] | Freq: Once | INTRAVENOUS | Status: AC
  Administered 2021-04-11: 10:00:00 250 [IU] via INTRAVENOUS

## 2021-04-11 NOTE — Patient Instructions (Signed)
PICC Home Care Guide A peripherally inserted central catheter (PICC) is a form of IV access that allows medicines and IV fluids to be quickly put into the blood and spread throughout the body. The PICC is a long, thin, flexible tube (catheter) that is put into a vein in a person's arm or leg. The catheter ends in a large vein just outside the heart called the superior vena cava (SVC). After the PICC is put in, a chest X-ray may be done to make sure that it is in the right place. A PICC may be placed for different reasons, such as: To give medicines and liquid nutrition. To give IV fluids and blood products. To take blood samples often. If there is trouble placing a peripheral intravenous (PIV) catheter. If cared for properly, a PICC can remain in place for many months. Having a PICC can allow you to go home from the hospital sooner and continue treatment at home. Medicines and PICC care can be managed at home by a family member, caregiver, or home health care team. What are the risks? Generally, having a PICC is safe. However, problems may occur, including: A blood clot (thrombus) forming in or at the end of the PICC. A blood clot forming in a vein (deep vein thrombosis) or traveling to the lung (pulmonary embolism). Inflammation of the vein (phlebitis) in which the PICC is placed. Infection at the insertion site or in the blood. Blood infections from central lines, like PICCs, can be serious and often require a hospital stay. PICC malposition, or PICC movement or poor placement. A break or cut in the PICC. Do not use scissors near the PICC. Nerve or tendon irritation or injury during PICC insertion. How to care for your PICC Please follow the specific guidelines provided by your health care provider. Preventing infection You and any caregivers should wash your hands often with soap and water for at least 20 seconds. Wash hands: Before touching the PICC or the infusion device. Before changing a  bandage (dressing). Do not change the dressing unless you have been taught to do so and have shown you are able to change it safely. Flush the PICC as told. Tell your health care provider right away if the PICC is hard to flush or does not flush. Do not use force to flush the PICC. Use clean and germ-free (sterile) supplies only. Keep the supplies in a dry place. Do not reuse needles, syringes, or any other supplies. Reusing supplies can lead to infection. Keep the PICC dressing dry and secure it with tape if the edges stop sticking to your skin. Check your PICC insertion site every day for signs of infection. Check for: Redness, swelling, or pain. Fluid or blood. Warmth. Pus or a bad smell. Preventing other problems Do not use a syringe that is less than 10 mL to flush the PICC. Do not have your blood pressure checked on the arm in which the PICC is placed. Do not ever pull or tug on the PICC. Keep it secured to your arm with tape or a stretch wrap when not in use. Do not take the PICC out yourself. Only a trained health care provider should remove the PICC. Keep pets and children away from your PICC. How to care for your PICC dressing Keep your PICC dressing clean and dry to prevent infection. Do not take baths, swim, or use a hot tub until your health care provider approves. Ask your health care provider if you can take   showers. You may only be allowed to take sponge baths. When you are allowed to shower: Ask your health care provider to teach you how to wrap the PICC. Cover the PICC with clear plastic wrap and tape to keep it dry while showering. Follow instructions from your health care provider about how to take care of your insertion site and dressing. Make sure you: Wash your hands with soap and water for at least 20 seconds before and after you change your dressing. If soap and water are not available, use hand sanitizer. Change your dressing only if taught to do so by your health care  provider. Your PICC dressing needs to be changed if it becomes loose or wet. Leave stitches (sutures), skin glue, or adhesive strips in place. These skin closures may need to stay in place for 2 weeks or longer. If adhesive strip edges start to loosen and curl up, you may trim the loose edges. Do not remove adhesive strips completely unless your health care provider tells you to do that. Follow these instructions at home: Disposal of supplies Throw away any syringes in a disposal container that is meant for sharp items (sharps container). You can buy a sharps container from a pharmacy, or you can make one by using an empty, hard plastic bottle with a lid. Place any used dressings or infusion bags into a plastic bag. Throw that bag in the trash. General instructions  Always carry your PICC identification card or wear a medical alert bracelet. Keep the tube clamped at all times, unless it is being used. Always carry a smooth-edge clamp with you to clamp the PICC if it breaks. Do not use scissors or sharp objects near the tube. You may bend your arm and move it freely. If your PICC is near or at the bend of your elbow, avoid activity with repeated motion at the elbow. Avoid lifting heavy objects as told by your health care provider. Keep all follow-up visits. This is important. You will need to have your PICC dressing changed at least once a week. Contact a health care provider if: You have pain in your arm, ear, face, or teeth. You have a fever or chills. You have redness, swelling, or pain around the insertion site. You have fluid or blood coming from the insertion site. Your insertion site feels warm to the touch. You have pus or a bad smell coming from the insertion site. Your skin feels hard and raised around the insertion site. Your PICC dressing has gotten wet or is coming off and you have not been taught how to change it. Get help right away if: You have problems with your PICC, such as  your PICC: Was tugged or pulled and has partially come out. Do not  push the PICC back in. Cannot be flushed, is hard to flush, or leaks around the insertion site when it is flushed. Makes a flushing sound when it is flushed. Appears to have a hole or tear. Is accidentally pulled all the way out. If this happens, cover the insertion site with a gauze dressing. Do not throw the PICC away. Your health care provider will need to check it to be sure the entire catheter came out. You feel your heart racing or skipping beats, or you have chest pain. You have shortness of breath or trouble breathing. You have swelling, redness, warmth, or pain in the arm in which the PICC is placed. You have a red streak going up your arm that   starts under the PICC dressing. These symptoms may be an emergency. Get help right away. Call 911. Do not wait to see if the symptoms will go away. Do not drive yourself to the hospital. Summary A peripherally inserted central catheter (PICC) is a long, thin, flexible tube (catheter) that is put into a vein in the arm or leg. If cared for properly, a PICC can remain in place for many months. Having a PICC can allow you to go home from the hospital sooner and continue treatment at home. The PICC is inserted using a germ-free (sterile) technique by a specially trained health care provider. Only a trained health care provider should remove it. Do not have your blood pressure checked on the arm in which your PICC is placed. Always keep your PICC identification card with you. This information is not intended to replace advice given to you by your health care provider. Make sure you discuss any questions you have with your health care provider. Document Revised: 10/27/2020 Document Reviewed: 10/27/2020 Elsevier Patient Education  2022 [employer].  

## 2021-04-13 ENCOUNTER — Other Ambulatory Visit: Payer: Self-pay

## 2021-04-13 ENCOUNTER — Inpatient Hospital Stay: Payer: 59

## 2021-04-13 ENCOUNTER — Inpatient Hospital Stay (HOSPITAL_BASED_OUTPATIENT_CLINIC_OR_DEPARTMENT_OTHER): Payer: 59 | Admitting: Family

## 2021-04-13 ENCOUNTER — Encounter: Payer: Self-pay | Admitting: Family

## 2021-04-13 VITALS — BP 141/89 | HR 71 | Temp 98.0°F | Resp 16 | Wt 274.0 lb

## 2021-04-13 DIAGNOSIS — C180 Malignant neoplasm of cecum: Secondary | ICD-10-CM | POA: Diagnosis not present

## 2021-04-13 DIAGNOSIS — Z5111 Encounter for antineoplastic chemotherapy: Secondary | ICD-10-CM | POA: Diagnosis not present

## 2021-04-13 LAB — CBC WITH DIFFERENTIAL (CANCER CENTER ONLY)
Abs Immature Granulocytes: 0.03 10*3/uL (ref 0.00–0.07)
Basophils Absolute: 0.1 10*3/uL (ref 0.0–0.1)
Basophils Relative: 1 %
Eosinophils Absolute: 0.1 10*3/uL (ref 0.0–0.5)
Eosinophils Relative: 1 %
HCT: 34.2 % — ABNORMAL LOW (ref 36.0–46.0)
Hemoglobin: 11.1 g/dL — ABNORMAL LOW (ref 12.0–15.0)
Immature Granulocytes: 0 %
Lymphocytes Relative: 37 %
Lymphs Abs: 2.5 10*3/uL (ref 0.7–4.0)
MCH: 30.5 pg (ref 26.0–34.0)
MCHC: 32.5 g/dL (ref 30.0–36.0)
MCV: 94 fL (ref 80.0–100.0)
Monocytes Absolute: 0.9 10*3/uL (ref 0.1–1.0)
Monocytes Relative: 13 %
Neutro Abs: 3.3 10*3/uL (ref 1.7–7.7)
Neutrophils Relative %: 48 %
Platelet Count: 293 10*3/uL (ref 150–400)
RBC: 3.64 MIL/uL — ABNORMAL LOW (ref 3.87–5.11)
RDW: 17.5 % — ABNORMAL HIGH (ref 11.5–15.5)
WBC Count: 6.8 10*3/uL (ref 4.0–10.5)
WBC Morphology: ABNORMAL
nRBC: 0 % (ref 0.0–0.2)

## 2021-04-13 LAB — CMP (CANCER CENTER ONLY)
ALT: 16 U/L (ref 0–44)
AST: 21 U/L (ref 15–41)
Albumin: 3.3 g/dL — ABNORMAL LOW (ref 3.5–5.0)
Alkaline Phosphatase: 69 U/L (ref 38–126)
Anion gap: 9 (ref 5–15)
BUN: 10 mg/dL (ref 6–20)
CO2: 28 mmol/L (ref 22–32)
Calcium: 9 mg/dL (ref 8.9–10.3)
Chloride: 106 mmol/L (ref 98–111)
Creatinine: 0.69 mg/dL (ref 0.44–1.00)
GFR, Estimated: >60 mL/min (ref >60)
Glucose, Bld: 104 mg/dL — ABNORMAL HIGH (ref 70–99)
Potassium: 3.4 mmol/L — ABNORMAL LOW (ref 3.5–5.1)
Sodium: 143 mmol/L (ref 135–145)
Total Bilirubin: 0.4 mg/dL (ref 0.3–1.2)
Total Protein: 6 g/dL — ABNORMAL LOW (ref 6.5–8.1)

## 2021-04-13 LAB — CEA (IN HOUSE-CHCC): CEA (CHCC-In House): 4.43 ng/mL (ref 0.00–5.00)

## 2021-04-13 MED ORDER — HEPARIN SOD (PORK) LOCK FLUSH 100 UNIT/ML IV SOLN
250.0000 [IU] | Freq: Once | INTRAVENOUS | Status: AC
  Administered 2021-04-13: 09:00:00 250 [IU]

## 2021-04-13 MED ORDER — SODIUM CHLORIDE 0.9% FLUSH
10.0000 mL | Freq: Once | INTRAVENOUS | Status: AC
  Administered 2021-04-13: 09:00:00 10 mL

## 2021-04-13 NOTE — Patient Instructions (Signed)
PICC Home Care Guide A peripherally inserted central catheter (PICC) is a form of IV access that allows medicines and IV fluids to be quickly put into the blood and spread throughout the body. The PICC is a long, thin, flexible tube (catheter) that is put into a vein in a person's arm or leg. The catheter ends in a large vein just outside the heart called the superior vena cava (SVC). After the PICC is put in, a chest X-ray may be done to make sure that it is in the right place. A PICC may be placed for different reasons, such as: To give medicines and liquid nutrition. To give IV fluids and blood products. To take blood samples often. If there is trouble placing a peripheral intravenous (PIV) catheter. If cared for properly, a PICC can remain in place for many months. Having a PICC can allow you to go home from the hospital sooner and continue treatment at home. Medicines and PICC care can be managed at home by a family member, caregiver, or home health care team. What are the risks? Generally, having a PICC is safe. However, problems may occur, including: A blood clot (thrombus) forming in or at the end of the PICC. A blood clot forming in a vein (deep vein thrombosis) or traveling to the lung (pulmonary embolism). Inflammation of the vein (phlebitis) in which the PICC is placed. Infection at the insertion site or in the blood. Blood infections from central lines, like PICCs, can be serious and often require a hospital stay. PICC malposition, or PICC movement or poor placement. A break or cut in the PICC. Do not use scissors near the PICC. Nerve or tendon irritation or injury during PICC insertion. How to care for your PICC Please follow the specific guidelines provided by your health care provider. Preventing infection You and any caregivers should wash your hands often with soap and water for at least 20 seconds. Wash hands: Before touching the PICC or the infusion device. Before changing a  bandage (dressing). Do not change the dressing unless you have been taught to do so and have shown you are able to change it safely. Flush the PICC as told. Tell your health care provider right away if the PICC is hard to flush or does not flush. Do not use force to flush the PICC. Use clean and germ-free (sterile) supplies only. Keep the supplies in a dry place. Do not reuse needles, syringes, or any other supplies. Reusing supplies can lead to infection. Keep the PICC dressing dry and secure it with tape if the edges stop sticking to your skin. Check your PICC insertion site every day for signs of infection. Check for: Redness, swelling, or pain. Fluid or blood. Warmth. Pus or a bad smell. Preventing other problems Do not use a syringe that is less than 10 mL to flush the PICC. Do not have your blood pressure checked on the arm in which the PICC is placed. Do not ever pull or tug on the PICC. Keep it secured to your arm with tape or a stretch wrap when not in use. Do not take the PICC out yourself. Only a trained health care provider should remove the PICC. Keep pets and children away from your PICC. How to care for your PICC dressing Keep your PICC dressing clean and dry to prevent infection. Do not take baths, swim, or use a hot tub until your health care provider approves. Ask your health care provider if you can take   showers. You may only be allowed to take sponge baths. When you are allowed to shower: Ask your health care provider to teach you how to wrap the PICC. Cover the PICC with clear plastic wrap and tape to keep it dry while showering. Follow instructions from your health care provider about how to take care of your insertion site and dressing. Make sure you: Wash your hands with soap and water for at least 20 seconds before and after you change your dressing. If soap and water are not available, use hand sanitizer. Change your dressing only if taught to do so by your health care  provider. Your PICC dressing needs to be changed if it becomes loose or wet. Leave stitches (sutures), skin glue, or adhesive strips in place. These skin closures may need to stay in place for 2 weeks or longer. If adhesive strip edges start to loosen and curl up, you may trim the loose edges. Do not remove adhesive strips completely unless your health care provider tells you to do that. Follow these instructions at home: Disposal of supplies Throw away any syringes in a disposal container that is meant for sharp items (sharps container). You can buy a sharps container from a pharmacy, or you can make one by using an empty, hard plastic bottle with a lid. Place any used dressings or infusion bags into a plastic bag. Throw that bag in the trash. General instructions  Always carry your PICC identification card or wear a medical alert bracelet. Keep the tube clamped at all times, unless it is being used. Always carry a smooth-edge clamp with you to clamp the PICC if it breaks. Do not use scissors or sharp objects near the tube. You may bend your arm and move it freely. If your PICC is near or at the bend of your elbow, avoid activity with repeated motion at the elbow. Avoid lifting heavy objects as told by your health care provider. Keep all follow-up visits. This is important. You will need to have your PICC dressing changed at least once a week. Contact a health care provider if: You have pain in your arm, ear, face, or teeth. You have a fever or chills. You have redness, swelling, or pain around the insertion site. You have fluid or blood coming from the insertion site. Your insertion site feels warm to the touch. You have pus or a bad smell coming from the insertion site. Your skin feels hard and raised around the insertion site. Your PICC dressing has gotten wet or is coming off and you have not been taught how to change it. Get help right away if: You have problems with your PICC, such as  your PICC: Was tugged or pulled and has partially come out. Do not  push the PICC back in. Cannot be flushed, is hard to flush, or leaks around the insertion site when it is flushed. Makes a flushing sound when it is flushed. Appears to have a hole or tear. Is accidentally pulled all the way out. If this happens, cover the insertion site with a gauze dressing. Do not throw the PICC away. Your health care provider will need to check it to be sure the entire catheter came out. You feel your heart racing or skipping beats, or you have chest pain. You have shortness of breath or trouble breathing. You have swelling, redness, warmth, or pain in the arm in which the PICC is placed. You have a red streak going up your arm that   starts under the PICC dressing. These symptoms may be an emergency. Get help right away. Call 911. Do not wait to see if the symptoms will go away. Do not drive yourself to the hospital. Summary A peripherally inserted central catheter (PICC) is a long, thin, flexible tube (catheter) that is put into a vein in the arm or leg. If cared for properly, a PICC can remain in place for many months. Having a PICC can allow you to go home from the hospital sooner and continue treatment at home. The PICC is inserted using a germ-free (sterile) technique by a specially trained health care provider. Only a trained health care provider should remove it. Do not have your blood pressure checked on the arm in which your PICC is placed. Always keep your PICC identification card with you. This information is not intended to replace advice given to you by your health care provider. Make sure you discuss any questions you have with your health care provider. Document Revised: 10/27/2020 Document Reviewed: 10/27/2020 Elsevier Patient Education  2022 [employer].  

## 2021-04-13 NOTE — Progress Notes (Signed)
Hematology and Oncology Follow Up Visit  Lauren Mcpherson [id number] 10-Feb-1966 55 y.o. 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 daily-start on 01/22/2021   Interim History:  Lauren Mcpherson is here today for follow-up and treatment. She is doing well but notes some fatigue after each cycle.  She had her follow up MRI of the abdomen which showed "further decrease in hepatic metastatic disease and no new or progressive disease noted".  She would prefer to wait and be treated next week so she can enjoy the Holiday weekend with her family.  PICC line in place. No redness or edema noted at site. Good blood return and easy flush per infusion RN. Unfortunately she had to have her port a cath taken out due to issues secure it and the port migrating.  No fever, chills, n/v, cough, rash, dizziness, SOB, chest pain, palpitations, abdominal pain or changes in bowel or bladder habits.  No blood loss noted. No bruising or petechiae.  No swelling, tenderness, numbness or tingling in her extremities at this time. She has intermittent tingling in her feet.  No falls or syncope.  She has been eating well and staying properly hydrated. Her weight is stable at 274 lbs.   ECOG Performance Status: 1 - Symptomatic but completely ambulatory  Medications:  Allergies as of 04/13/2021   No Known Allergies      Medication List        Accurate as of April 13, 2021  9:51 AM. If you have any questions, ask your nurse or doctor.          dexamethasone 6 MG tablet Commonly known as: DECADRON Take 1 tablet (6 mg total) by mouth See admin instructions. Take 6 mg by mouth daily for 3 days, starting the day after chemotherapy. Take with food.   loperamide 2 MG tablet Commonly  known as: Imodium A-D Take 2 at onset of diarrhea, then 1 every 2hrs until 12hr without a BM. May take 2 tab every 4hrs at bedtime. If diarrhea recurs repeat. What changed:  how much to take how to take this when to take this additional instructions   ondansetron 8 MG tablet Commonly known as: Zofran Take 1 tablet (8 mg total) by mouth 2 (two) times daily as needed. Start on day 3 after chemotherapy. What changed:  when to take this additional instructions   prochlorperazine 10 MG tablet Commonly known as: COMPAZINE Take 1 tablet (10 mg total) by mouth every 6 (six) hours as needed (Nausea or vomiting). What changed: reasons to take this   rivaroxaban 20 MG Tabs tablet Commonly known as: XARELTO Take 1 tablet (20 mg total) by mouth daily with supper.        Allergies: No Known Allergies  Past Medical History, Surgical history, Social history, and Family History were reviewed and updated.  Review of Systems: All other 10 point review of systems is negative.   Physical Exam:  weight is 274 lb (124.3 kg). Her oral temperature is 98 F (36.7 C). Her blood pressure is 141/89 (abnormal) and her pulse is 71. Her respiration is 16 and oxygen saturation is 100%.   Wt Readings from Last 3 Encounters:  04/13/21 274 lb (124.3 kg)  03/23/21 274 lb (124.3 kg)  03/02/21 273 lb (123.8 kg)    Ocular: Sclerae unicteric, pupils equal, round and reactive to light Ear-nose-throat: Oropharynx clear, dentition fair Lymphatic: No cervical or supraclavicular adenopathy Lungs no rales or rhonchi, good excursion bilaterally Heart regular rate and rhythm, no murmur appreciated Abd soft, nontender, positive bowel sounds MSK no focal spinal tenderness, no joint edema Neuro: non-focal, well-oriented, appropriate affect Breasts: Deferred   Lab Results  Component Value Date   WBC 6.8 04/13/2021   HGB 11.1 (L) 04/13/2021   HCT 34.2 (L) 04/13/2021   MCV 94.0 04/13/2021   PLT 293 04/13/2021    Lab Results  Component Value Date   FERRITIN 176 01/04/2021   IRON 69 01/04/2021   TIBC 244 (L) 01/04/2021   UIBC 175 01/04/2021   IRONPCTSAT 28 01/04/2021   Lab Results  Component Value Date   RETICCTPCT 3.0 01/04/2021   RBC 3.64 (L) 04/13/2021   No results found for: KPAFRELGTCHN, LAMBDASER, KAPLAMBRATIO No results found for: IGGSERUM, IGA, IGMSERUM No results found for: Ronnald Ramp, A1GS, A2GS, Violet Baldy, MSPIKE, SPEI   Chemistry      Component Value Date/Time   NA 143 04/13/2021 0900   K 3.4 (L) 04/13/2021 0900   CL 106 04/13/2021 0900   CO2 28 04/13/2021 0900   BUN 10 04/13/2021 0900   CREATININE 0.69 04/13/2021 0900      Component Value Date/Time   CALCIUM 9.0 04/13/2021 0900   ALKPHOS 69 04/13/2021 0900   AST 21 04/13/2021 0900   ALT 16 04/13/2021 0900   BILITOT 0.4 04/13/2021 0900       Impression and Plan: Lauren Mcpherson is a very pleasant 36 African American female with metastatic adenocarcinoma of the cecum.  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 CEA last visit was 7.29. MRI of abdomen last week showed overall nice response to treatment.  We will plan to resume treatment next week after Christmas.  PICC line intact.  She is concerned about work and being able to pay her bills. She is considering giving up her job but is not sure if she could possibly work every other week if they would let her.  I spoke with Dr. Marin Olp and he will consider changing her to Oxaliplatin with Xeloda. This may help her schedule and ability to go back to her job.  Follow-up on 05/04/2021 with MD.   Lottie Dawson, NP 12/21/20229:51 AM

## 2021-04-15 ENCOUNTER — Inpatient Hospital Stay: Payer: 59

## 2021-04-15 ENCOUNTER — Other Ambulatory Visit: Payer: Self-pay

## 2021-04-15 ENCOUNTER — Encounter: Payer: Self-pay | Admitting: Hematology & Oncology

## 2021-04-15 ENCOUNTER — Encounter: Payer: Self-pay | Admitting: Family

## 2021-04-15 DIAGNOSIS — C180 Malignant neoplasm of cecum: Secondary | ICD-10-CM

## 2021-04-15 DIAGNOSIS — Z5111 Encounter for antineoplastic chemotherapy: Secondary | ICD-10-CM | POA: Diagnosis not present

## 2021-04-15 MED ORDER — DEXAMETHASONE 6 MG PO TABS: 6.0000 mg | ORAL_TABLET | ORAL | 0 refills | Status: DC

## 2021-04-15 MED ORDER — HEPARIN SOD (PORK) LOCK FLUSH 100 UNIT/ML IV SOLN
250.0000 [IU] | Freq: Once | INTRAVENOUS | Status: AC
  Administered 2021-04-15: 10:00:00 250 [IU]

## 2021-04-15 MED ORDER — SODIUM CHLORIDE 0.9% FLUSH
10.0000 mL | Freq: Once | INTRAVENOUS | Status: AC
  Administered 2021-04-15: 10:00:00 10 mL

## 2021-04-15 NOTE — Patient Instructions (Signed)
PICC Home Care Guide A peripherally inserted central catheter (PICC) is a form of IV access that allows medicines and IV fluids to be quickly put into the blood and spread throughout the body. The PICC is a long, thin, flexible tube (catheter) that is put into a vein in a person's arm or leg. The catheter ends in a large vein just outside the heart called the superior vena cava (SVC). After the PICC is put in, a chest X-ray may be done to make sure that it is in the right place. A PICC may be placed for different reasons, such as: To give medicines and liquid nutrition. To give IV fluids and blood products. To take blood samples often. If there is trouble placing a peripheral intravenous (PIV) catheter. If cared for properly, a PICC can remain in place for many months. Having a PICC can allow you to go home from the hospital sooner and continue treatment at home. Medicines and PICC care can be managed at home by a family member, caregiver, or home health care team. What are the risks? Generally, having a PICC is safe. However, problems may occur, including: A blood clot (thrombus) forming in or at the end of the PICC. A blood clot forming in a vein (deep vein thrombosis) or traveling to the lung (pulmonary embolism). Inflammation of the vein (phlebitis) in which the PICC is placed. Infection at the insertion site or in the blood. Blood infections from central lines, like PICCs, can be serious and often require a hospital stay. PICC malposition, or PICC movement or poor placement. A break or cut in the PICC. Do not use scissors near the PICC. Nerve or tendon irritation or injury during PICC insertion. How to care for your PICC Please follow the specific guidelines provided by your health care provider. Preventing infection You and any caregivers should wash your hands often with soap and water for at least 20 seconds. Wash hands: Before touching the PICC or the infusion device. Before changing a  bandage (dressing). Do not change the dressing unless you have been taught to do so and have shown you are able to change it safely. Flush the PICC as told. Tell your health care provider right away if the PICC is hard to flush or does not flush. Do not use force to flush the PICC. Use clean and germ-free (sterile) supplies only. Keep the supplies in a dry place. Do not reuse needles, syringes, or any other supplies. Reusing supplies can lead to infection. Keep the PICC dressing dry and secure it with tape if the edges stop sticking to your skin. Check your PICC insertion site every day for signs of infection. Check for: Redness, swelling, or pain. Fluid or blood. Warmth. Pus or a bad smell. Preventing other problems Do not use a syringe that is less than 10 mL to flush the PICC. Do not have your blood pressure checked on the arm in which the PICC is placed. Do not ever pull or tug on the PICC. Keep it secured to your arm with tape or a stretch wrap when not in use. Do not take the PICC out yourself. Only a trained health care provider should remove the PICC. Keep pets and children away from your PICC. How to care for your PICC dressing Keep your PICC dressing clean and dry to prevent infection. Do not take baths, swim, or use a hot tub until your health care provider approves. Ask your health care provider if you can take   showers. You may only be allowed to take sponge baths. When you are allowed to shower: Ask your health care provider to teach you how to wrap the PICC. Cover the PICC with clear plastic wrap and tape to keep it dry while showering. Follow instructions from your health care provider about how to take care of your insertion site and dressing. Make sure you: Wash your hands with soap and water for at least 20 seconds before and after you change your dressing. If soap and water are not available, use hand sanitizer. Change your dressing only if taught to do so by your health care  provider. Your PICC dressing needs to be changed if it becomes loose or wet. Leave stitches (sutures), skin glue, or adhesive strips in place. These skin closures may need to stay in place for 2 weeks or longer. If adhesive strip edges start to loosen and curl up, you may trim the loose edges. Do not remove adhesive strips completely unless your health care provider tells you to do that. Follow these instructions at home: Disposal of supplies Throw away any syringes in a disposal container that is meant for sharp items (sharps container). You can buy a sharps container from a pharmacy, or you can make one by using an empty, hard plastic bottle with a lid. Place any used dressings or infusion bags into a plastic bag. Throw that bag in the trash. General instructions  Always carry your PICC identification card or wear a medical alert bracelet. Keep the tube clamped at all times, unless it is being used. Always carry a smooth-edge clamp with you to clamp the PICC if it breaks. Do not use scissors or sharp objects near the tube. You may bend your arm and move it freely. If your PICC is near or at the bend of your elbow, avoid activity with repeated motion at the elbow. Avoid lifting heavy objects as told by your health care provider. Keep all follow-up visits. This is important. You will need to have your PICC dressing changed at least once a week. Contact a health care provider if: You have pain in your arm, ear, face, or teeth. You have a fever or chills. You have redness, swelling, or pain around the insertion site. You have fluid or blood coming from the insertion site. Your insertion site feels warm to the touch. You have pus or a bad smell coming from the insertion site. Your skin feels hard and raised around the insertion site. Your PICC dressing has gotten wet or is coming off and you have not been taught how to change it. Get help right away if: You have problems with your PICC, such as  your PICC: Was tugged or pulled and has partially come out. Do not  push the PICC back in. Cannot be flushed, is hard to flush, or leaks around the insertion site when it is flushed. Makes a flushing sound when it is flushed. Appears to have a hole or tear. Is accidentally pulled all the way out. If this happens, cover the insertion site with a gauze dressing. Do not throw the PICC away. Your health care provider will need to check it to be sure the entire catheter came out. You feel your heart racing or skipping beats, or you have chest pain. You have shortness of breath or trouble breathing. You have swelling, redness, warmth, or pain in the arm in which the PICC is placed. You have a red streak going up your arm that   starts under the PICC dressing. These symptoms may be an emergency. Get help right away. Call 911. Do not wait to see if the symptoms will go away. Do not drive yourself to the hospital. Summary A peripherally inserted central catheter (PICC) is a long, thin, flexible tube (catheter) that is put into a vein in the arm or leg. If cared for properly, a PICC can remain in place for many months. Having a PICC can allow you to go home from the hospital sooner and continue treatment at home. The PICC is inserted using a germ-free (sterile) technique by a specially trained health care provider. Only a trained health care provider should remove it. Do not have your blood pressure checked on the arm in which your PICC is placed. Always keep your PICC identification card with you. This information is not intended to replace advice given to you by your health care provider. Make sure you discuss any questions you have with your health care provider. Document Revised: 10/27/2020 Document Reviewed: 10/27/2020 Elsevier Patient Education  2022 [employer].  

## 2021-04-20 ENCOUNTER — Other Ambulatory Visit: Payer: Self-pay

## 2021-04-20 ENCOUNTER — Other Ambulatory Visit: Payer: PRIVATE HEALTH INSURANCE

## 2021-04-20 ENCOUNTER — Inpatient Hospital Stay: Payer: 59

## 2021-04-20 ENCOUNTER — Ambulatory Visit: Payer: PRIVATE HEALTH INSURANCE

## 2021-04-20 VITALS — BP 125/74 | HR 74

## 2021-04-20 DIAGNOSIS — Z5111 Encounter for antineoplastic chemotherapy: Secondary | ICD-10-CM | POA: Diagnosis not present

## 2021-04-20 DIAGNOSIS — C180 Malignant neoplasm of cecum: Secondary | ICD-10-CM

## 2021-04-20 LAB — CMP (CANCER CENTER ONLY)
ALT: 16 U/L (ref 0–44)
AST: 25 U/L (ref 15–41)
Albumin: 3.4 g/dL — ABNORMAL LOW (ref 3.5–5.0)
Alkaline Phosphatase: 70 U/L (ref 38–126)
Anion gap: 7 (ref 5–15)
BUN: 9 mg/dL (ref 6–20)
CO2: 28 mmol/L (ref 22–32)
Calcium: 9.3 mg/dL (ref 8.9–10.3)
Chloride: 106 mmol/L (ref 98–111)
Creatinine: 0.7 mg/dL (ref 0.44–1.00)
GFR, Estimated: >60 mL/min (ref >60)
Glucose, Bld: 105 mg/dL — ABNORMAL HIGH (ref 70–99)
Potassium: 3.7 mmol/L (ref 3.5–5.1)
Sodium: 141 mmol/L (ref 135–145)
Total Bilirubin: 0.4 mg/dL (ref 0.3–1.2)
Total Protein: 6.2 g/dL — ABNORMAL LOW (ref 6.5–8.1)

## 2021-04-20 LAB — CBC WITH DIFFERENTIAL (CANCER CENTER ONLY)
Abs Immature Granulocytes: 0.01 10*3/uL (ref 0.00–0.07)
Basophils Absolute: 0.1 10*3/uL (ref 0.0–0.1)
Basophils Relative: 1 %
Eosinophils Absolute: 0 10*3/uL (ref 0.0–0.5)
Eosinophils Relative: 1 %
HCT: 36.9 % (ref 36.0–46.0)
Hemoglobin: 11.7 g/dL — ABNORMAL LOW (ref 12.0–15.0)
Immature Granulocytes: 0 %
Lymphocytes Relative: 39 %
Lymphs Abs: 2 10*3/uL (ref 0.7–4.0)
MCH: 30 pg (ref 26.0–34.0)
MCHC: 31.7 g/dL (ref 30.0–36.0)
MCV: 94.6 fL (ref 80.0–100.0)
Monocytes Absolute: 0.6 10*3/uL (ref 0.1–1.0)
Monocytes Relative: 12 %
Neutro Abs: 2.4 10*3/uL (ref 1.7–7.7)
Neutrophils Relative %: 47 %
Platelet Count: 327 10*3/uL (ref 150–400)
RBC: 3.9 MIL/uL (ref 3.87–5.11)
RDW: 17.5 % — ABNORMAL HIGH (ref 11.5–15.5)
WBC Count: 5.2 10*3/uL (ref 4.0–10.5)
nRBC: 0 % (ref 0.0–0.2)

## 2021-04-20 MED ORDER — SODIUM CHLORIDE 0.9 % IV SOLN
10.0000 mg | Freq: Once | INTRAVENOUS | Status: AC
  Administered 2021-04-20: 09:00:00 10 mg via INTRAVENOUS
  Filled 2021-04-20: qty 10

## 2021-04-20 MED ORDER — SODIUM CHLORIDE 0.9 % IV SOLN
2400.0000 mg/m2 | INTRAVENOUS | Status: DC
  Administered 2021-04-20: 13:00:00 5750 mg via INTRAVENOUS
  Filled 2021-04-20: qty 115

## 2021-04-20 MED ORDER — DEXTROSE 5 % IV SOLN: Freq: Once | INTRAVENOUS | Status: AC

## 2021-04-20 MED ORDER — SODIUM CHLORIDE 0.9 % IV SOLN
165.0000 mg/m2 | Freq: Once | INTRAVENOUS | Status: AC
  Administered 2021-04-20: 10:00:00 400 mg via INTRAVENOUS
  Filled 2021-04-20: qty 15

## 2021-04-20 MED ORDER — PALONOSETRON HCL INJECTION 0.25 MG/5ML
0.2500 mg | Freq: Once | INTRAVENOUS | Status: AC
  Administered 2021-04-20: 09:00:00 0.25 mg via INTRAVENOUS
  Filled 2021-04-20: qty 5

## 2021-04-20 MED ORDER — ATROPINE SULFATE 1 MG/ML IV SOLN
0.5000 mg | Freq: Once | INTRAVENOUS | Status: AC | PRN
  Administered 2021-04-20: 10:00:00 0.5 mg via INTRAVENOUS
  Filled 2021-04-20: qty 1

## 2021-04-20 MED ORDER — SODIUM CHLORIDE 0.9 % IV SOLN: Freq: Once | INTRAVENOUS | Status: DC

## 2021-04-20 MED ORDER — LEUCOVORIN CALCIUM INJECTION 350 MG
400.0000 mg/m2 | Freq: Once | INTRAVENOUS | Status: AC
  Administered 2021-04-20: 10:00:00 956 mg via INTRAVENOUS
  Filled 2021-04-20: qty 47.8

## 2021-04-20 MED ORDER — OXALIPLATIN CHEMO INJECTION 100 MG/20ML
84.0000 mg/m2 | Freq: Once | INTRAVENOUS | Status: AC
  Administered 2021-04-20: 11:00:00 200 mg via INTRAVENOUS
  Filled 2021-04-20: qty 40

## 2021-04-20 MED ORDER — SODIUM CHLORIDE 0.9 % IV SOLN
150.0000 mg | Freq: Once | INTRAVENOUS | Status: AC
  Administered 2021-04-20: 09:00:00 150 mg via INTRAVENOUS
  Filled 2021-04-20: qty 150

## 2021-04-20 NOTE — Patient Instructions (Signed)
[facility]  Discharge Instructions: Thank you for choosing [facility] to provide your oncology and hematology care.   If you have a lab appointment with the [facility], please go directly to the [facility] and check in at the registration area.  Wear comfortable clothing and clothing appropriate for easy access to any Portacath or PICC line.   We strive to give you quality time with your provider. You may need to reschedule your appointment if you arrive late (15 or more minutes).  Arriving late affects you and other patients whose appointments are after yours.  Also, if you miss three or more appointments without notifying the office, you may be dismissed from the clinic at the providers discretion.      For prescription refill requests, have your pharmacy contact our office and allow 72 hours for refills to be completed.    Today you received the following chemotherapy and/or immunotherapy agents: Oxaliplatin/Irinotecan/Leucovorin/5 FU       To help prevent nausea and vomiting after your treatment, we encourage you to take your nausea medication as directed.  BELOW ARE SYMPTOMS THAT SHOULD BE REPORTED IMMEDIATELY: *FEVER GREATER THAN 100.4 F (38 C) OR HIGHER *CHILLS OR SWEATING *NAUSEA AND VOMITING THAT IS NOT CONTROLLED WITH YOUR NAUSEA MEDICATION *UNUSUAL SHORTNESS OF BREATH *UNUSUAL BRUISING OR BLEEDING *URINARY PROBLEMS (pain or burning when urinating, or frequent urination) *BOWEL PROBLEMS (unusual diarrhea, constipation, pain near the anus) TENDERNESS IN MOUTH AND THROAT WITH OR WITHOUT PRESENCE OF ULCERS (sore throat, sores in mouth, or a toothache) UNUSUAL RASH, SWELLING OR PAIN  UNUSUAL VAGINAL DISCHARGE OR ITCHING   Items with * indicate a potential emergency and should be followed up as soon as possible or go to the Emergency Department if any problems should occur.  Please show the CHEMOTHERAPY ALERT CARD or IMMUNOTHERAPY  ALERT CARD at check-in to the Emergency Department and triage nurse. Should you have questions after your visit or need to cancel or reschedule your appointment, please contact [facility]  [phone] and follow the prompts.  Office hours are 8:00 a.m. to 4:30 p.m. Monday - Friday. Please note that voicemails left after 4:00 p.m. may not be returned until the following business day.  We are closed weekends and major holidays. You have access to a nurse at all times for urgent questions. Please call the main number to the clinic [phone] and follow the prompts.  For any non-urgent questions, you may also contact your provider using MyChart. We now offer e-Visits for anyone 77 and older to request care online for non-urgent symptoms. For details visit [email or web address].   Also download the MyChart app! Go to the app store, search "MyChart", open the app, select [facility], and log in with your MyChart username and password.  Due to Covid, a mask is required upon entering the hospital/clinic. If you do not have a mask, one will be given to you upon arrival. For doctor visits, patients may have 1 support person aged 24 or older with them. For treatment visits, patients cannot have anyone with them due to current Covid guidelines and our immunocompromised population.

## 2021-04-21 ENCOUNTER — Encounter: Payer: Self-pay | Admitting: Hematology & Oncology

## 2021-04-21 ENCOUNTER — Encounter: Payer: Self-pay | Admitting: Family

## 2021-04-22 ENCOUNTER — Inpatient Hospital Stay: Payer: 59

## 2021-04-22 ENCOUNTER — Other Ambulatory Visit: Payer: Self-pay

## 2021-04-22 VITALS — BP 131/88 | HR 106 | Temp 98.0°F

## 2021-04-22 DIAGNOSIS — Z5111 Encounter for antineoplastic chemotherapy: Secondary | ICD-10-CM | POA: Diagnosis not present

## 2021-04-22 DIAGNOSIS — C180 Malignant neoplasm of cecum: Secondary | ICD-10-CM

## 2021-04-22 MED ORDER — SODIUM CHLORIDE 0.9% FLUSH
10.0000 mL | INTRAVENOUS | Status: DC | PRN
  Administered 2021-04-22: 13:00:00 10 mL

## 2021-04-22 MED ORDER — HEPARIN SOD (PORK) LOCK FLUSH 100 UNIT/ML IV SOLN
500.0000 [IU] | Freq: Once | INTRAVENOUS | Status: AC | PRN
  Administered 2021-04-22: 13:00:00 500 [IU]

## 2021-04-22 MED ORDER — PEGFILGRASTIM INJECTION 6 MG/0.6ML ~~LOC~~
6.0000 mg | PREFILLED_SYRINGE | Freq: Once | SUBCUTANEOUS | Status: AC
  Administered 2021-04-22: 14:00:00 6 mg via SUBCUTANEOUS
  Filled 2021-04-22: qty 0.6

## 2021-04-22 NOTE — Patient Instructions (Signed)

## 2021-04-27 ENCOUNTER — Inpatient Hospital Stay: Payer: PRIVATE HEALTH INSURANCE | Attending: Hematology & Oncology

## 2021-04-27 ENCOUNTER — Other Ambulatory Visit: Payer: Self-pay | Admitting: *Deleted

## 2021-04-27 ENCOUNTER — Other Ambulatory Visit: Payer: Self-pay

## 2021-04-27 VITALS — BP 125/94 | HR 92 | Temp 97.9°F | Resp 18

## 2021-04-27 DIAGNOSIS — G629 Polyneuropathy, unspecified: Secondary | ICD-10-CM | POA: Insufficient documentation

## 2021-04-27 DIAGNOSIS — Z7901 Long term (current) use of anticoagulants: Secondary | ICD-10-CM | POA: Diagnosis not present

## 2021-04-27 DIAGNOSIS — I2699 Other pulmonary embolism without acute cor pulmonale: Secondary | ICD-10-CM | POA: Diagnosis not present

## 2021-04-27 DIAGNOSIS — Z5111 Encounter for antineoplastic chemotherapy: Secondary | ICD-10-CM | POA: Insufficient documentation

## 2021-04-27 DIAGNOSIS — C180 Malignant neoplasm of cecum: Secondary | ICD-10-CM | POA: Diagnosis present

## 2021-04-27 DIAGNOSIS — Z452 Encounter for adjustment and management of vascular access device: Secondary | ICD-10-CM | POA: Insufficient documentation

## 2021-04-27 DIAGNOSIS — Z5189 Encounter for other specified aftercare: Secondary | ICD-10-CM | POA: Diagnosis not present

## 2021-04-27 DIAGNOSIS — C787 Secondary malignant neoplasm of liver and intrahepatic bile duct: Secondary | ICD-10-CM | POA: Diagnosis not present

## 2021-04-27 MED ORDER — APIXABAN 5 MG PO TABS: 5.0000 mg | ORAL_TABLET | Freq: Two times a day (BID) | ORAL | 3 refills | Status: DC

## 2021-04-27 MED ORDER — SODIUM CHLORIDE 0.9% FLUSH
10.0000 mL | Freq: Once | INTRAVENOUS | Status: AC
  Administered 2021-04-27: 10 mL

## 2021-04-27 MED ORDER — HEPARIN SOD (PORK) LOCK FLUSH 100 UNIT/ML IV SOLN
250.0000 [IU] | Freq: Once | INTRAVENOUS | Status: AC
  Administered 2021-04-27: 250 [IU]

## 2021-04-27 NOTE — Progress Notes (Signed)
Cost of Xarelto over 500$/month due to change in insurance. Xarelto discontinued d/t cost.   Reviewed with MD, Rx sent to pt pharmacy for Eliquis 5mg  tablet BID. Discussed with pt, no concerns at this time.

## 2021-04-29 ENCOUNTER — Inpatient Hospital Stay: Payer: PRIVATE HEALTH INSURANCE

## 2021-04-29 ENCOUNTER — Encounter: Payer: Self-pay | Admitting: Family

## 2021-04-29 ENCOUNTER — Encounter: Payer: Self-pay | Admitting: Hematology & Oncology

## 2021-04-29 ENCOUNTER — Other Ambulatory Visit: Payer: Self-pay

## 2021-04-29 VITALS — BP 127/83 | HR 91 | Temp 97.5°F | Resp 18

## 2021-04-29 DIAGNOSIS — Z5111 Encounter for antineoplastic chemotherapy: Secondary | ICD-10-CM | POA: Diagnosis not present

## 2021-04-29 DIAGNOSIS — C180 Malignant neoplasm of cecum: Secondary | ICD-10-CM

## 2021-04-29 MED ORDER — SODIUM CHLORIDE 0.9% FLUSH
10.0000 mL | Freq: Once | INTRAVENOUS | Status: AC
  Administered 2021-04-29: 10 mL

## 2021-04-29 MED ORDER — RIVAROXABAN 20 MG PO TABS: 20.0000 mg | ORAL_TABLET | Freq: Every day | ORAL | 3 refills | Status: DC

## 2021-04-29 MED ORDER — HEPARIN SOD (PORK) LOCK FLUSH 100 UNIT/ML IV SOLN
500.0000 [IU] | Freq: Once | INTRAVENOUS | Status: AC
  Administered 2021-04-29: 500 [IU]

## 2021-04-29 NOTE — Patient Instructions (Signed)
PICC Home Care Guide A peripherally inserted central catheter (PICC) is a form of IV access that allows medicines and IV fluids to be quickly put into the blood and spread throughout the body. The PICC is a long, thin, flexible tube (catheter) that is put into a vein in a person's arm or leg. The catheter ends in a large vein just outside the heart called the superior vena cava (SVC). After the PICC is put in, a chest X-ray may be done to make sure that it is in the right place. A PICC may be placed for different reasons, such as: To give medicines and liquid nutrition. To give IV fluids and blood products. To take blood samples often. If there is trouble placing a peripheral intravenous (PIV) catheter. If cared for properly, a PICC can remain in place for many months. Having a PICC can allow you to go home from the hospital sooner and continue treatment at home. Medicines and PICC care can be managed at home by a family member, caregiver, or home health care team. What are the risks? Generally, having a PICC is safe. However, problems may occur, including: A blood clot (thrombus) forming in or at the end of the PICC. A blood clot forming in a vein (deep vein thrombosis) or traveling to the lung (pulmonary embolism). Inflammation of the vein (phlebitis) in which the PICC is placed. Infection at the insertion site or in the blood. Blood infections from central lines, like PICCs, can be serious and often require a hospital stay. PICC malposition, or PICC movement or poor placement. A break or cut in the PICC. Do not use scissors near the PICC. Nerve or tendon irritation or injury during PICC insertion. How to care for your PICC Please follow the specific guidelines provided by your health care provider. Preventing infection You and any caregivers should wash your hands often with soap and water for at least 20 seconds. Wash hands: Before touching the PICC or the infusion device. Before changing a  bandage (dressing). Do not change the dressing unless you have been taught to do so and have shown you are able to change it safely. Flush the PICC as told. Tell your health care provider right away if the PICC is hard to flush or does not flush. Do not use force to flush the PICC. Use clean and germ-free (sterile) supplies only. Keep the supplies in a dry place. Do not reuse needles, syringes, or any other supplies. Reusing supplies can lead to infection. Keep the PICC dressing dry and secure it with tape if the edges stop sticking to your skin. Check your PICC insertion site every day for signs of infection. Check for: Redness, swelling, or pain. Fluid or blood. Warmth. Pus or a bad smell. Preventing other problems Do not use a syringe that is less than 10 mL to flush the PICC. Do not have your blood pressure checked on the arm in which the PICC is placed. Do not ever pull or tug on the PICC. Keep it secured to your arm with tape or a stretch wrap when not in use. Do not take the PICC out yourself. Only a trained health care provider should remove the PICC. Keep pets and children away from your PICC. How to care for your PICC dressing Keep your PICC dressing clean and dry to prevent infection. Do not take baths, swim, or use a hot tub until your health care provider approves. Ask your health care provider if you can take   showers. You may only be allowed to take sponge baths. When you are allowed to shower: Ask your health care provider to teach you how to wrap the PICC. Cover the PICC with clear plastic wrap and tape to keep it dry while showering. Follow instructions from your health care provider about how to take care of your insertion site and dressing. Make sure you: Wash your hands with soap and water for at least 20 seconds before and after you change your dressing. If soap and water are not available, use hand sanitizer. Change your dressing only if taught to do so by your health care  provider. Your PICC dressing needs to be changed if it becomes loose or wet. Leave stitches (sutures), skin glue, or adhesive strips in place. These skin closures may need to stay in place for 2 weeks or longer. If adhesive strip edges start to loosen and curl up, you may trim the loose edges. Do not remove adhesive strips completely unless your health care provider tells you to do that. Follow these instructions at home: Disposal of supplies Throw away any syringes in a disposal container that is meant for sharp items (sharps container). You can buy a sharps container from a pharmacy, or you can make one by using an empty, hard plastic bottle with a lid. Place any used dressings or infusion bags into a plastic bag. Throw that bag in the trash. General instructions  Always carry your PICC identification card or wear a medical alert bracelet. Keep the tube clamped at all times, unless it is being used. Always carry a smooth-edge clamp with you to clamp the PICC if it breaks. Do not use scissors or sharp objects near the tube. You may bend your arm and move it freely. If your PICC is near or at the bend of your elbow, avoid activity with repeated motion at the elbow. Avoid lifting heavy objects as told by your health care provider. Keep all follow-up visits. This is important. You will need to have your PICC dressing changed at least once a week. Contact a health care provider if: You have pain in your arm, ear, face, or teeth. You have a fever or chills. You have redness, swelling, or pain around the insertion site. You have fluid or blood coming from the insertion site. Your insertion site feels warm to the touch. You have pus or a bad smell coming from the insertion site. Your skin feels hard and raised around the insertion site. Your PICC dressing has gotten wet or is coming off and you have not been taught how to change it. Get help right away if: You have problems with your PICC, such as  your PICC: Was tugged or pulled and has partially come out. Do not  push the PICC back in. Cannot be flushed, is hard to flush, or leaks around the insertion site when it is flushed. Makes a flushing sound when it is flushed. Appears to have a hole or tear. Is accidentally pulled all the way out. If this happens, cover the insertion site with a gauze dressing. Do not throw the PICC away. Your health care provider will need to check it to be sure the entire catheter came out. You feel your heart racing or skipping beats, or you have chest pain. You have shortness of breath or trouble breathing. You have swelling, redness, warmth, or pain in the arm in which the PICC is placed. You have a red streak going up your arm that   starts under the PICC dressing. These symptoms may be an emergency. Get help right away. Call 911. Do not wait to see if the symptoms will go away. Do not drive yourself to the hospital. Summary A peripherally inserted central catheter (PICC) is a long, thin, flexible tube (catheter) that is put into a vein in the arm or leg. If cared for properly, a PICC can remain in place for many months. Having a PICC can allow you to go home from the hospital sooner and continue treatment at home. The PICC is inserted using a germ-free (sterile) technique by a specially trained health care provider. Only a trained health care provider should remove it. Do not have your blood pressure checked on the arm in which your PICC is placed. Always keep your PICC identification card with you. This information is not intended to replace advice given to you by your health care provider. Make sure you discuss any questions you have with your health care provider. Document Revised: 10/27/2020 Document Reviewed: 10/27/2020 Elsevier Patient Education  2022 [employer].  

## 2021-04-29 NOTE — Progress Notes (Signed)
Per pt: Eliquis rx over 500$. Pt did not fill rx due to cost.  Pt called Ranelle Oyster for PA regarding Xarelto Rx and assistance. Pt requesting to go back on Xarelto 20 PO Daily. New Rx sent to [facility] per pt request.

## 2021-05-02 ENCOUNTER — Other Ambulatory Visit: Payer: Self-pay

## 2021-05-02 ENCOUNTER — Inpatient Hospital Stay: Payer: PRIVATE HEALTH INSURANCE

## 2021-05-02 VITALS — BP 140/87 | HR 89 | Temp 98.1°F | Resp 17

## 2021-05-02 DIAGNOSIS — Z5111 Encounter for antineoplastic chemotherapy: Secondary | ICD-10-CM | POA: Diagnosis not present

## 2021-05-02 DIAGNOSIS — C180 Malignant neoplasm of cecum: Secondary | ICD-10-CM

## 2021-05-02 MED ORDER — HEPARIN SOD (PORK) LOCK FLUSH 100 UNIT/ML IV SOLN
500.0000 [IU] | Freq: Once | INTRAVENOUS | Status: AC
  Administered 2021-05-02: 250 [IU]

## 2021-05-02 MED ORDER — SODIUM CHLORIDE 0.9% FLUSH
10.0000 mL | Freq: Once | INTRAVENOUS | Status: AC
  Administered 2021-05-02: 10 mL

## 2021-05-02 NOTE — Patient Instructions (Signed)
PICC Home Care Guide A peripherally inserted central catheter (PICC) is a form of IV access that allows medicines and IV fluids to be quickly put into the blood and spread throughout the body. The PICC is a long, thin, flexible tube (catheter) that is put into a vein in a person's arm or leg. The catheter ends in a large vein just outside the heart called the superior vena cava (SVC). After the PICC is put in, a chest X-ray may be done to make sure that it is in the right place. A PICC may be placed for different reasons, such as: To give medicines and liquid nutrition. To give IV fluids and blood products. To take blood samples often. If there is trouble placing a peripheral intravenous (PIV) catheter. If cared for properly, a PICC can remain in place for many months. Having a PICC can allow you to go home from the hospital sooner and continue treatment at home. Medicines and PICC care can be managed at home by a family member, caregiver, or home health care team. What are the risks? Generally, having a PICC is safe. However, problems may occur, including: A blood clot (thrombus) forming in or at the end of the PICC. A blood clot forming in a vein (deep vein thrombosis) or traveling to the lung (pulmonary embolism). Inflammation of the vein (phlebitis) in which the PICC is placed. Infection at the insertion site or in the blood. Blood infections from central lines, like PICCs, can be serious and often require a hospital stay. PICC malposition, or PICC movement or poor placement. A break or cut in the PICC. Do not use scissors near the PICC. Nerve or tendon irritation or injury during PICC insertion. How to care for your PICC Please follow the specific guidelines provided by your health care provider. Preventing infection You and any caregivers should wash your hands often with soap and water for at least 20 seconds. Wash hands: Before touching the PICC or the infusion device. Before changing a  bandage (dressing). Do not change the dressing unless you have been taught to do so and have shown you are able to change it safely. Flush the PICC as told. Tell your health care provider right away if the PICC is hard to flush or does not flush. Do not use force to flush the PICC. Use clean and germ-free (sterile) supplies only. Keep the supplies in a dry place. Do not reuse needles, syringes, or any other supplies. Reusing supplies can lead to infection. Keep the PICC dressing dry and secure it with tape if the edges stop sticking to your skin. Check your PICC insertion site every day for signs of infection. Check for: Redness, swelling, or pain. Fluid or blood. Warmth. Pus or a bad smell. Preventing other problems Do not use a syringe that is less than 10 mL to flush the PICC. Do not have your blood pressure checked on the arm in which the PICC is placed. Do not ever pull or tug on the PICC. Keep it secured to your arm with tape or a stretch wrap when not in use. Do not take the PICC out yourself. Only a trained health care provider should remove the PICC. Keep pets and children away from your PICC. How to care for your PICC dressing Keep your PICC dressing clean and dry to prevent infection. Do not take baths, swim, or use a hot tub until your health care provider approves. Ask your health care provider if you can take   showers. You may only be allowed to take sponge baths. When you are allowed to shower: Ask your health care provider to teach you how to wrap the PICC. Cover the PICC with clear plastic wrap and tape to keep it dry while showering. Follow instructions from your health care provider about how to take care of your insertion site and dressing. Make sure you: Wash your hands with soap and water for at least 20 seconds before and after you change your dressing. If soap and water are not available, use hand sanitizer. Change your dressing only if taught to do so by your health care  provider. Your PICC dressing needs to be changed if it becomes loose or wet. Leave stitches (sutures), skin glue, or adhesive strips in place. These skin closures may need to stay in place for 2 weeks or longer. If adhesive strip edges start to loosen and curl up, you may trim the loose edges. Do not remove adhesive strips completely unless your health care provider tells you to do that. Follow these instructions at home: Disposal of supplies Throw away any syringes in a disposal container that is meant for sharp items (sharps container). You can buy a sharps container from a pharmacy, or you can make one by using an empty, hard plastic bottle with a lid. Place any used dressings or infusion bags into a plastic bag. Throw that bag in the trash. General instructions  Always carry your PICC identification card or wear a medical alert bracelet. Keep the tube clamped at all times, unless it is being used. Always carry a smooth-edge clamp with you to clamp the PICC if it breaks. Do not use scissors or sharp objects near the tube. You may bend your arm and move it freely. If your PICC is near or at the bend of your elbow, avoid activity with repeated motion at the elbow. Avoid lifting heavy objects as told by your health care provider. Keep all follow-up visits. This is important. You will need to have your PICC dressing changed at least once a week. Contact a health care provider if: You have pain in your arm, ear, face, or teeth. You have a fever or chills. You have redness, swelling, or pain around the insertion site. You have fluid or blood coming from the insertion site. Your insertion site feels warm to the touch. You have pus or a bad smell coming from the insertion site. Your skin feels hard and raised around the insertion site. Your PICC dressing has gotten wet or is coming off and you have not been taught how to change it. Get help right away if: You have problems with your PICC, such as  your PICC: Was tugged or pulled and has partially come out. Do not  push the PICC back in. Cannot be flushed, is hard to flush, or leaks around the insertion site when it is flushed. Makes a flushing sound when it is flushed. Appears to have a hole or tear. Is accidentally pulled all the way out. If this happens, cover the insertion site with a gauze dressing. Do not throw the PICC away. Your health care provider will need to check it to be sure the entire catheter came out. You feel your heart racing or skipping beats, or you have chest pain. You have shortness of breath or trouble breathing. You have swelling, redness, warmth, or pain in the arm in which the PICC is placed. You have a red streak going up your arm that   starts under the PICC dressing. These symptoms may be an emergency. Get help right away. Call 911. Do not wait to see if the symptoms will go away. Do not drive yourself to the hospital. Summary A peripherally inserted central catheter (PICC) is a long, thin, flexible tube (catheter) that is put into a vein in the arm or leg. If cared for properly, a PICC can remain in place for many months. Having a PICC can allow you to go home from the hospital sooner and continue treatment at home. The PICC is inserted using a germ-free (sterile) technique by a specially trained health care provider. Only a trained health care provider should remove it. Do not have your blood pressure checked on the arm in which your PICC is placed. Always keep your PICC identification card with you. This information is not intended to replace advice given to you by your health care provider. Make sure you discuss any questions you have with your health care provider. Document Revised: 10/27/2020 Document Reviewed: 10/27/2020 Elsevier Patient Education  2022 [employer].  

## 2021-05-03 ENCOUNTER — Encounter: Payer: Self-pay | Admitting: Family

## 2021-05-03 ENCOUNTER — Encounter: Payer: Self-pay | Admitting: Hematology & Oncology

## 2021-05-04 ENCOUNTER — Inpatient Hospital Stay: Payer: PRIVATE HEALTH INSURANCE

## 2021-05-04 ENCOUNTER — Telehealth: Payer: Self-pay

## 2021-05-04 ENCOUNTER — Inpatient Hospital Stay (HOSPITAL_BASED_OUTPATIENT_CLINIC_OR_DEPARTMENT_OTHER): Payer: PRIVATE HEALTH INSURANCE | Admitting: Hematology & Oncology

## 2021-05-04 ENCOUNTER — Other Ambulatory Visit: Payer: Self-pay

## 2021-05-04 ENCOUNTER — Encounter: Payer: Self-pay | Admitting: Hematology & Oncology

## 2021-05-04 VITALS — BP 131/84 | HR 93 | Temp 98.2°F | Resp 18 | Wt 268.0 lb

## 2021-05-04 DIAGNOSIS — C180 Malignant neoplasm of cecum: Secondary | ICD-10-CM | POA: Diagnosis not present

## 2021-05-04 DIAGNOSIS — Z5111 Encounter for antineoplastic chemotherapy: Secondary | ICD-10-CM | POA: Diagnosis not present

## 2021-05-04 LAB — CBC WITH DIFFERENTIAL (CANCER CENTER ONLY)
Abs Immature Granulocytes: 0.02 10*3/uL (ref 0.00–0.07)
Basophils Absolute: 0 10*3/uL (ref 0.0–0.1)
Basophils Relative: 1 %
Eosinophils Absolute: 0 10*3/uL (ref 0.0–0.5)
Eosinophils Relative: 1 %
HCT: 34.5 % — ABNORMAL LOW (ref 36.0–46.0)
Hemoglobin: 11.4 g/dL — ABNORMAL LOW (ref 12.0–15.0)
Immature Granulocytes: 1 %
Lymphocytes Relative: 44 %
Lymphs Abs: 1.7 10*3/uL (ref 0.7–4.0)
MCH: 30.9 pg (ref 26.0–34.0)
MCHC: 33 g/dL (ref 30.0–36.0)
MCV: 93.5 fL (ref 80.0–100.0)
Monocytes Absolute: 0.4 10*3/uL (ref 0.1–1.0)
Monocytes Relative: 11 %
Neutro Abs: 1.6 10*3/uL — ABNORMAL LOW (ref 1.7–7.7)
Neutrophils Relative %: 42 %
Platelet Count: 143 10*3/uL — ABNORMAL LOW (ref 150–400)
RBC: 3.69 MIL/uL — ABNORMAL LOW (ref 3.87–5.11)
RDW: 15.9 % — ABNORMAL HIGH (ref 11.5–15.5)
WBC Count: 3.9 10*3/uL — ABNORMAL LOW (ref 4.0–10.5)
nRBC: 0 % (ref 0.0–0.2)

## 2021-05-04 LAB — CMP (CANCER CENTER ONLY)
ALT: 21 U/L (ref 0–44)
AST: 19 U/L (ref 15–41)
Albumin: 3.6 g/dL (ref 3.5–5.0)
Alkaline Phosphatase: 86 U/L (ref 38–126)
Anion gap: 9 (ref 5–15)
BUN: 6 mg/dL (ref 6–20)
CO2: 28 mmol/L (ref 22–32)
Calcium: 9.5 mg/dL (ref 8.9–10.3)
Chloride: 105 mmol/L (ref 98–111)
Creatinine: 0.67 mg/dL (ref 0.44–1.00)
GFR, Estimated: >60 mL/min (ref >60)
Glucose, Bld: 109 mg/dL — ABNORMAL HIGH (ref 70–99)
Potassium: 3.2 mmol/L — ABNORMAL LOW (ref 3.5–5.1)
Sodium: 142 mmol/L (ref 135–145)
Total Bilirubin: 0.4 mg/dL (ref 0.3–1.2)
Total Protein: 5.9 g/dL — ABNORMAL LOW (ref 6.5–8.1)

## 2021-05-04 LAB — CEA (IN HOUSE-CHCC): CEA (CHCC-In House): 3.89 ng/mL (ref 0.00–5.00)

## 2021-05-04 MED ORDER — LORAZEPAM 2 MG/ML IJ SOLN
0.5000 mg | Freq: Once | INTRAMUSCULAR | Status: AC
  Administered 2021-05-04: 0.5 mg via INTRAVENOUS

## 2021-05-04 MED ORDER — DEXAMETHASONE 6 MG PO TABS: 6.0000 mg | ORAL_TABLET | ORAL | 2 refills | Status: DC

## 2021-05-04 MED ORDER — SODIUM CHLORIDE 0.9 % IV SOLN
165.0000 mg/m2 | Freq: Once | INTRAVENOUS | Status: AC
  Administered 2021-05-04: 400 mg via INTRAVENOUS
  Filled 2021-05-04: qty 15

## 2021-05-04 MED ORDER — SODIUM CHLORIDE 0.9 % IV SOLN
2400.0000 mg/m2 | INTRAVENOUS | Status: DC
  Administered 2021-05-04: 5750 mg via INTRAVENOUS
  Filled 2021-05-04: qty 115

## 2021-05-04 MED ORDER — DEXTROSE 5 % IV SOLN: Freq: Once | INTRAVENOUS | Status: AC

## 2021-05-04 MED ORDER — LEUCOVORIN CALCIUM INJECTION 350 MG
400.0000 mg/m2 | Freq: Once | INTRAVENOUS | Status: AC
  Administered 2021-05-04: 956 mg via INTRAVENOUS
  Filled 2021-05-04: qty 47.8

## 2021-05-04 MED ORDER — SODIUM CHLORIDE 0.9 % IV SOLN
10.0000 mg | Freq: Once | INTRAVENOUS | Status: AC
  Administered 2021-05-04: 10 mg via INTRAVENOUS
  Filled 2021-05-04: qty 10

## 2021-05-04 MED ORDER — PALONOSETRON HCL INJECTION 0.25 MG/5ML
0.2500 mg | Freq: Once | INTRAVENOUS | Status: AC
  Administered 2021-05-04: 0.25 mg via INTRAVENOUS
  Filled 2021-05-04: qty 5

## 2021-05-04 MED ORDER — OXALIPLATIN CHEMO INJECTION 100 MG/20ML
84.0000 mg/m2 | Freq: Once | INTRAVENOUS | Status: AC
  Administered 2021-05-04: 200 mg via INTRAVENOUS
  Filled 2021-05-04: qty 40

## 2021-05-04 MED ORDER — SODIUM CHLORIDE 0.9 % IV SOLN
150.0000 mg | Freq: Once | INTRAVENOUS | Status: AC
  Administered 2021-05-04: 150 mg via INTRAVENOUS
  Filled 2021-05-04: qty 150

## 2021-05-04 MED ORDER — ATROPINE SULFATE 1 MG/ML IV SOLN
0.5000 mg | Freq: Once | INTRAVENOUS | Status: AC | PRN
  Administered 2021-05-04: 0.5 mg via INTRAVENOUS
  Filled 2021-05-04: qty 1

## 2021-05-04 MED ORDER — LORAZEPAM 2 MG/ML IJ SOLN
INTRAMUSCULAR | Status: AC
  Filled 2021-05-04: qty 1

## 2021-05-04 NOTE — Telephone Encounter (Signed)
Pt requesting a refill on Decadron 6 mg. Ok to refill per Dr Johnette Abraham.

## 2021-05-04 NOTE — Progress Notes (Signed)
1404: pt reports nausea and requests emesis basin. Order received for Ativan. Oxali and leucovorin paused and line flushed. Administered as charted.   1412: pt reports improved nausea.

## 2021-05-04 NOTE — Progress Notes (Signed)
Hematology and Oncology Follow Up Visit  Lauren Mcpherson [id number] 02-11-1966 56 y.o. 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 daily-start on 01/22/2021   Interim History:  Lauren Mcpherson is here today for follow-up and treatment.  So far, she is doing quite nicely.  She does have little bit of neuropathy in her feet.  As such, going to have to watch out with the oxaliplatin.  After this cycle, we may want to drop the oxaliplatin.    Her last CEA level was down to 4.7.  Para she has had no abdominal pain.  Is been no diarrhea.  She has had no cough or shortness of breath.  She has had no leg swelling.  She is on Xarelto because of the pulmonary embolism.  At some point, we will probably have to recheck to see if this is resolved.    She has had no headache.  There is been no mouth sores.  Overall, I would say performance status is probably ECOG 1.     Medications:  Allergies as of 05/04/2021   No Known Allergies      Medication List        Accurate as of May 04, 2021 10:08 AM. If you have any questions, ask your nurse or doctor.          dexamethasone 6 MG tablet Commonly known as: DECADRON Take 1 tablet (6 mg total) by mouth See admin instructions. Take 6 mg by mouth daily for 3 days, starting the day after chemotherapy. Take with food.   loperamide 2 MG tablet Commonly known as: Imodium A-D Take 2 at onset of diarrhea, then 1 every 2hrs until 12hr without a BM. May take 2 tab every 4hrs at bedtime. If diarrhea recurs repeat. What changed:  how much to take how to take this when to take this additional instructions   ondansetron 8 MG tablet Commonly known as: Zofran Take 1 tablet (8 mg total) by mouth 2 (two) times daily as  needed. Start on day 3 after chemotherapy. What changed:  when to take this additional instructions   prochlorperazine 10 MG tablet Commonly known as: COMPAZINE Take 1 tablet (10 mg total) by mouth every 6 (six) hours as needed (Nausea or vomiting). What changed: reasons to take this   rivaroxaban 20 MG Tabs tablet Commonly known as: XARELTO Take 20 mg by mouth daily with supper.   rivaroxaban 20 MG Tabs tablet Commonly known as: XARELTO Take 1 tablet (20 mg total) by mouth daily with supper.        Allergies: No Known Allergies  Past Medical History, Surgical history, Social history, and Family History were reviewed and updated.  Review of Systems: Review of Systems  Constitutional: Negative.   HENT: Negative.    Eyes: Negative.   Respiratory: Negative.    Cardiovascular: Negative.   Gastrointestinal: Negative.   Genitourinary: Negative.   Musculoskeletal: Negative.   Skin: Negative.   Neurological:  Positive for tingling.  Endo/Heme/Allergies: Negative.   Psychiatric/Behavioral: Negative.       Physical Exam:  weight is 268 lb (121.6 kg). Her oral temperature is 98.2 F (36.8 C). Her blood pressure is 131/84 and her pulse is 93. Her respiration is 18 and oxygen  saturation is 100%.   Wt Readings from Last 3 Encounters:  05/04/21 268 lb (121.6 kg)  04/13/21 274 lb (124.3 kg)  03/23/21 274 lb (124.3 kg)   Physical Exam Vitals reviewed.  HENT:     Head: Normocephalic and atraumatic.  Eyes:     Pupils: Pupils are equal, round, and reactive to light.  Cardiovascular:     Rate and Rhythm: Normal rate and regular rhythm.     Heart sounds: Normal heart sounds.  Pulmonary:     Effort: Pulmonary effort is normal.     Breath sounds: Normal breath sounds.  Abdominal:     General: Bowel sounds are normal.     Palpations: Abdomen is soft.  Musculoskeletal:        General: No tenderness or deformity. Normal range of motion.     Cervical back: Normal range of  motion.  Lymphadenopathy:     Cervical: No cervical adenopathy.  Skin:    General: Skin is warm and dry.     Findings: No erythema or rash.  Neurological:     Mental Status: She is alert and oriented to person, place, and time.  Psychiatric:        Behavior: Behavior normal.        Thought Content: Thought content normal.        Judgment: Judgment normal.     Lab Results  Component Value Date   WBC 3.9 (L) 05/04/2021   HGB 11.4 (L) 05/04/2021   HCT 34.5 (L) 05/04/2021   MCV 93.5 05/04/2021   PLT 143 (L) 05/04/2021   Lab Results  Component Value Date   FERRITIN 176 01/04/2021   IRON 69 01/04/2021   TIBC 244 (L) 01/04/2021   UIBC 175 01/04/2021   IRONPCTSAT 28 01/04/2021   Lab Results  Component Value Date   RETICCTPCT 3.0 01/04/2021   RBC 3.69 (L) 05/04/2021   No results found for: KPAFRELGTCHN, LAMBDASER, KAPLAMBRATIO No results found for: IGGSERUM, IGA, IGMSERUM No results found for: Odetta Pink, SPEI   Chemistry      Component Value Date/Time   NA 142 05/04/2021 0846   K 3.2 (L) 05/04/2021 0846   CL 105 05/04/2021 0846   CO2 28 05/04/2021 0846   BUN 6 05/04/2021 0846   CREATININE 0.67 05/04/2021 0846      Component Value Date/Time   CALCIUM 9.5 05/04/2021 0846   ALKPHOS 86 05/04/2021 0846   AST 19 05/04/2021 0846   ALT 21 05/04/2021 0846   BILITOT 0.4 05/04/2021 0846       Impression and Plan: Lauren Mcpherson is a very pleasant 77 African American female with metastatic adenocarcinoma of the cecum.  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  I am glad that the CEA level was back down.  She had MRI that was done back in December that showed that she was responding.  Again, we will have to think about dropping the oxaliplatin with her next cycle of treatment.  At some point, we may want to consider some kind of maintenance therapy with her.  Unfortunately, we really  cannot use Avastin because of the thromboembolic disease.  We will plan to get her back in another couple weeks.  [name], MD 1/11/202310:08 AM

## 2021-05-04 NOTE — Patient Instructions (Signed)
[facility]  Discharge Instructions: Thank you for choosing [facility] to provide your oncology and hematology care.   If you have a lab appointment with the [facility], please go directly to the [facility] and check in at the registration area.  Wear comfortable clothing and clothing appropriate for easy access to any Portacath or PICC line.   We strive to give you quality time with your provider. You may need to reschedule your appointment if you arrive late (15 or more minutes).  Arriving late affects you and other patients whose appointments are after yours.  Also, if you miss three or more appointments without notifying the office, you may be dismissed from the clinic at the providers discretion.      For prescription refill requests, have your pharmacy contact our office and allow 72 hours for refills to be completed.    Today you received the following chemotherapy and/or immunotherapy agents Folforinox      To help prevent nausea and vomiting after your treatment, we encourage you to take your nausea medication as directed.  BELOW ARE SYMPTOMS THAT SHOULD BE REPORTED IMMEDIATELY: *FEVER GREATER THAN 100.4 F (38 C) OR HIGHER *CHILLS OR SWEATING *NAUSEA AND VOMITING THAT IS NOT CONTROLLED WITH YOUR NAUSEA MEDICATION *UNUSUAL SHORTNESS OF BREATH *UNUSUAL BRUISING OR BLEEDING *URINARY PROBLEMS (pain or burning when urinating, or frequent urination) *BOWEL PROBLEMS (unusual diarrhea, constipation, pain near the anus) TENDERNESS IN MOUTH AND THROAT WITH OR WITHOUT PRESENCE OF ULCERS (sore throat, sores in mouth, or a toothache) UNUSUAL RASH, SWELLING OR PAIN  UNUSUAL VAGINAL DISCHARGE OR ITCHING   Items with * indicate a potential emergency and should be followed up as soon as possible or go to the Emergency Department if any problems should occur.  Please show the CHEMOTHERAPY ALERT CARD or IMMUNOTHERAPY ALERT CARD at check-in to the  Emergency Department and triage nurse. Should you have questions after your visit or need to cancel or reschedule your appointment, please contact [facility]  [phone] and follow the prompts.  Office hours are 8:00 a.m. to 4:30 p.m. Monday - Friday. Please note that voicemails left after 4:00 p.m. may not be returned until the following business day.  We are closed weekends and major holidays. You have access to a nurse at all times for urgent questions. Please call the main number to the clinic [phone] and follow the prompts.  For any non-urgent questions, you may also contact your provider using MyChart. We now offer e-Visits for anyone 36 and older to request care online for non-urgent symptoms. For details visit [email or web address].   Also download the MyChart app! Go to the app store, search "MyChart", open the app, select [facility], and log in with your MyChart username and password.  Due to Covid, a mask is required upon entering the hospital/clinic. If you do not have a mask, one will be given to you upon arrival. For doctor visits, patients may have 1 support person aged 68 or older with them. For treatment visits, patients cannot have anyone with them due to current Covid guidelines and our immunocompromised population.

## 2021-05-06 ENCOUNTER — Encounter: Payer: Self-pay | Admitting: Hematology & Oncology

## 2021-05-06 ENCOUNTER — Encounter: Payer: Self-pay | Admitting: Family

## 2021-05-06 ENCOUNTER — Inpatient Hospital Stay: Payer: PRIVATE HEALTH INSURANCE

## 2021-05-06 ENCOUNTER — Other Ambulatory Visit: Payer: Self-pay

## 2021-05-06 VITALS — BP 130/78 | HR 89 | Temp 97.7°F | Resp 19

## 2021-05-06 DIAGNOSIS — Z5111 Encounter for antineoplastic chemotherapy: Secondary | ICD-10-CM | POA: Diagnosis not present

## 2021-05-06 DIAGNOSIS — C180 Malignant neoplasm of cecum: Secondary | ICD-10-CM

## 2021-05-06 MED ORDER — PEGFILGRASTIM INJECTION 6 MG/0.6ML ~~LOC~~
6.0000 mg | PREFILLED_SYRINGE | Freq: Once | SUBCUTANEOUS | Status: AC
  Administered 2021-05-06: 6 mg via SUBCUTANEOUS
  Filled 2021-05-06: qty 0.6

## 2021-05-06 MED ORDER — HEPARIN SOD (PORK) LOCK FLUSH 100 UNIT/ML IV SOLN
500.0000 [IU] | Freq: Once | INTRAVENOUS | Status: AC | PRN
  Administered 2021-05-06: 500 [IU]

## 2021-05-06 MED ORDER — SODIUM CHLORIDE 0.9% FLUSH
10.0000 mL | INTRAVENOUS | Status: DC | PRN
  Administered 2021-05-06: 10 mL

## 2021-05-06 NOTE — Patient Instructions (Signed)

## 2021-05-09 ENCOUNTER — Inpatient Hospital Stay: Payer: PRIVATE HEALTH INSURANCE

## 2021-05-09 ENCOUNTER — Other Ambulatory Visit: Payer: Self-pay

## 2021-05-09 VITALS — BP 126/90 | HR 93 | Temp 98.5°F | Resp 16

## 2021-05-09 DIAGNOSIS — C180 Malignant neoplasm of cecum: Secondary | ICD-10-CM

## 2021-05-09 DIAGNOSIS — Z5111 Encounter for antineoplastic chemotherapy: Secondary | ICD-10-CM | POA: Diagnosis not present

## 2021-05-09 MED ORDER — SODIUM CHLORIDE 0.9% FLUSH
10.0000 mL | Freq: Once | INTRAVENOUS | Status: AC
  Administered 2021-05-09: 10 mL

## 2021-05-09 MED ORDER — HEPARIN SOD (PORK) LOCK FLUSH 100 UNIT/ML IV SOLN
250.0000 [IU] | Freq: Once | INTRAVENOUS | Status: AC
  Administered 2021-05-09: 250 [IU]

## 2021-05-09 NOTE — Patient Instructions (Signed)

## 2021-05-11 ENCOUNTER — Encounter: Payer: Self-pay | Admitting: Hematology & Oncology

## 2021-05-11 ENCOUNTER — Inpatient Hospital Stay: Payer: PRIVATE HEALTH INSURANCE

## 2021-05-11 ENCOUNTER — Encounter: Payer: Self-pay | Admitting: Family

## 2021-05-11 ENCOUNTER — Other Ambulatory Visit: Payer: Self-pay

## 2021-05-11 VITALS — BP 124/88 | HR 111 | Resp 16

## 2021-05-11 DIAGNOSIS — Z5111 Encounter for antineoplastic chemotherapy: Secondary | ICD-10-CM | POA: Diagnosis not present

## 2021-05-11 DIAGNOSIS — C180 Malignant neoplasm of cecum: Secondary | ICD-10-CM

## 2021-05-11 MED ORDER — HEPARIN SOD (PORK) LOCK FLUSH 100 UNIT/ML IV SOLN
250.0000 [IU] | Freq: Once | INTRAVENOUS | Status: AC
  Administered 2021-05-11: 250 [IU]

## 2021-05-11 MED ORDER — SODIUM CHLORIDE 0.9% FLUSH
10.0000 mL | Freq: Once | INTRAVENOUS | Status: AC
  Administered 2021-05-11: 10 mL

## 2021-05-11 NOTE — Patient Instructions (Signed)
PICC Home Care Guide A peripherally inserted central catheter (PICC) is a form of IV access that allows medicines and IV fluids to be quickly put into the blood and spread throughout the body. The PICC is a long, thin, flexible tube (catheter) that is put into a vein in a person's arm or leg. The catheter ends in a large vein just outside the heart called the superior vena cava (SVC). After the PICC is put in, a chest X-ray may be done to make sure that it is in the right place. A PICC may be placed for different reasons, such as: To give medicines and liquid nutrition. To give IV fluids and blood products. To take blood samples often. If there is trouble placing a peripheral intravenous (PIV) catheter. If cared for properly, a PICC can remain in place for many months. Having a PICC can allow you to go home from the hospital sooner and continue treatment at home. Medicines and PICC care can be managed at home by a family member, caregiver, or home health care team. What are the risks? Generally, having a PICC is safe. However, problems may occur, including: A blood clot (thrombus) forming in or at the end of the PICC. A blood clot forming in a vein (deep vein thrombosis) or traveling to the lung (pulmonary embolism). Inflammation of the vein (phlebitis) in which the PICC is placed. Infection at the insertion site or in the blood. Blood infections from central lines, like PICCs, can be serious and often require a hospital stay. PICC malposition, or PICC movement or poor placement. A break or cut in the PICC. Do not use scissors near the PICC. Nerve or tendon irritation or injury during PICC insertion. How to care for your PICC Please follow the specific guidelines provided by your health care provider. Preventing infection You and any caregivers should wash your hands often with soap and water for at least 20 seconds. Wash hands: Before touching the PICC or the infusion device. Before changing a  bandage (dressing). Do not change the dressing unless you have been taught to do so and have shown you are able to change it safely. Flush the PICC as told. Tell your health care provider right away if the PICC is hard to flush or does not flush. Do not use force to flush the PICC. Use clean and germ-free (sterile) supplies only. Keep the supplies in a dry place. Do not reuse needles, syringes, or any other supplies. Reusing supplies can lead to infection. Keep the PICC dressing dry and secure it with tape if the edges stop sticking to your skin. Check your PICC insertion site every day for signs of infection. Check for: Redness, swelling, or pain. Fluid or blood. Warmth. Pus or a bad smell. Preventing other problems Do not use a syringe that is less than 10 mL to flush the PICC. Do not have your blood pressure checked on the arm in which the PICC is placed. Do not ever pull or tug on the PICC. Keep it secured to your arm with tape or a stretch wrap when not in use. Do not take the PICC out yourself. Only a trained health care provider should remove the PICC. Keep pets and children away from your PICC. How to care for your PICC dressing Keep your PICC dressing clean and dry to prevent infection. Do not take baths, swim, or use a hot tub until your health care provider approves. Ask your health care provider if you can take   showers. You may only be allowed to take sponge baths. When you are allowed to shower: Ask your health care provider to teach you how to wrap the PICC. Cover the PICC with clear plastic wrap and tape to keep it dry while showering. Follow instructions from your health care provider about how to take care of your insertion site and dressing. Make sure you: Wash your hands with soap and water for at least 20 seconds before and after you change your dressing. If soap and water are not available, use hand sanitizer. Change your dressing only if taught to do so by your health care  provider. Your PICC dressing needs to be changed if it becomes loose or wet. Leave stitches (sutures), skin glue, or adhesive strips in place. These skin closures may need to stay in place for 2 weeks or longer. If adhesive strip edges start to loosen and curl up, you may trim the loose edges. Do not remove adhesive strips completely unless your health care provider tells you to do that. Follow these instructions at home: Disposal of supplies Throw away any syringes in a disposal container that is meant for sharp items (sharps container). You can buy a sharps container from a pharmacy, or you can make one by using an empty, hard plastic bottle with a lid. Place any used dressings or infusion bags into a plastic bag. Throw that bag in the trash. General instructions  Always carry your PICC identification card or wear a medical alert bracelet. Keep the tube clamped at all times, unless it is being used. Always carry a smooth-edge clamp with you to clamp the PICC if it breaks. Do not use scissors or sharp objects near the tube. You may bend your arm and move it freely. If your PICC is near or at the bend of your elbow, avoid activity with repeated motion at the elbow. Avoid lifting heavy objects as told by your health care provider. Keep all follow-up visits. This is important. You will need to have your PICC dressing changed at least once a week. Contact a health care provider if: You have pain in your arm, ear, face, or teeth. You have a fever or chills. You have redness, swelling, or pain around the insertion site. You have fluid or blood coming from the insertion site. Your insertion site feels warm to the touch. You have pus or a bad smell coming from the insertion site. Your skin feels hard and raised around the insertion site. Your PICC dressing has gotten wet or is coming off and you have not been taught how to change it. Get help right away if: You have problems with your PICC, such as  your PICC: Was tugged or pulled and has partially come out. Do not  push the PICC back in. Cannot be flushed, is hard to flush, or leaks around the insertion site when it is flushed. Makes a flushing sound when it is flushed. Appears to have a hole or tear. Is accidentally pulled all the way out. If this happens, cover the insertion site with a gauze dressing. Do not throw the PICC away. Your health care provider will need to check it to be sure the entire catheter came out. You feel your heart racing or skipping beats, or you have chest pain. You have shortness of breath or trouble breathing. You have swelling, redness, warmth, or pain in the arm in which the PICC is placed. You have a red streak going up your arm that   starts under the PICC dressing. These symptoms may be an emergency. Get help right away. Call 911. Do not wait to see if the symptoms will go away. Do not drive yourself to the hospital. Summary A peripherally inserted central catheter (PICC) is a long, thin, flexible tube (catheter) that is put into a vein in the arm or leg. If cared for properly, a PICC can remain in place for many months. Having a PICC can allow you to go home from the hospital sooner and continue treatment at home. The PICC is inserted using a germ-free (sterile) technique by a specially trained health care provider. Only a trained health care provider should remove it. Do not have your blood pressure checked on the arm in which your PICC is placed. Always keep your PICC identification card with you. This information is not intended to replace advice given to you by your health care provider. Make sure you discuss any questions you have with your health care provider. Document Revised: 10/27/2020 Document Reviewed: 10/27/2020 Elsevier Patient Education  2022 [employer].  

## 2021-05-12 ENCOUNTER — Ambulatory Visit: Payer: 59 | Admitting: Family

## 2021-05-13 ENCOUNTER — Other Ambulatory Visit: Payer: Self-pay

## 2021-05-13 ENCOUNTER — Inpatient Hospital Stay: Payer: PRIVATE HEALTH INSURANCE

## 2021-05-13 ENCOUNTER — Other Ambulatory Visit: Payer: Self-pay | Admitting: Hematology & Oncology

## 2021-05-13 VITALS — BP 131/85 | HR 93 | Temp 98.2°F | Resp 18 | Ht 63.0 in | Wt 267.4 lb

## 2021-05-13 DIAGNOSIS — Z452 Encounter for adjustment and management of vascular access device: Secondary | ICD-10-CM

## 2021-05-13 DIAGNOSIS — Z5111 Encounter for antineoplastic chemotherapy: Secondary | ICD-10-CM | POA: Diagnosis not present

## 2021-05-13 MED ORDER — SODIUM CHLORIDE 0.9% FLUSH
10.0000 mL | Freq: Once | INTRAVENOUS | Status: AC
  Administered 2021-05-13: 10 mL via INTRAVENOUS

## 2021-05-13 MED ORDER — HEPARIN SOD (PORK) LOCK FLUSH 100 UNIT/ML IV SOLN
500.0000 [IU] | Freq: Once | INTRAVENOUS | Status: AC
  Administered 2021-05-13: 500 [IU] via INTRAVENOUS

## 2021-05-13 MED FILL — Fluorouracil IV Soln 5 GM/100ML (50 MG/ML): INTRAVENOUS | Qty: 115 | Status: AC

## 2021-05-13 NOTE — Patient Instructions (Signed)
PICC Home Care Guide A peripherally inserted central catheter (PICC) is a form of IV access that allows medicines and IV fluids to be quickly put into the blood and spread throughout the body. The PICC is a long, thin, flexible tube (catheter) that is put into a vein in a person's arm or leg. The catheter ends in a large vein just outside the heart called the superior vena cava (SVC). After the PICC is put in, a chest X-ray may be done to make sure that it is in the right place. A PICC may be placed for different reasons, such as: To give medicines and liquid nutrition. To give IV fluids and blood products. To take blood samples often. If there is trouble placing a peripheral intravenous (PIV) catheter. If cared for properly, a PICC can remain in place for many months. Having a PICC can allow you to go home from the hospital sooner and continue treatment at home. Medicines and PICC care can be managed at home by a family member, caregiver, or home health care team. What are the risks? Generally, having a PICC is safe. However, problems may occur, including: A blood clot (thrombus) forming in or at the end of the PICC. A blood clot forming in a vein (deep vein thrombosis) or traveling to the lung (pulmonary embolism). Inflammation of the vein (phlebitis) in which the PICC is placed. Infection at the insertion site or in the blood. Blood infections from central lines, like PICCs, can be serious and often require a hospital stay. PICC malposition, or PICC movement or poor placement. A break or cut in the PICC. Do not use scissors near the PICC. Nerve or tendon irritation or injury during PICC insertion. How to care for your PICC Please follow the specific guidelines provided by your health care provider. Preventing infection You and any caregivers should wash your hands often with soap and water for at least 20 seconds. Wash hands: Before touching the PICC or the infusion device. Before changing a  bandage (dressing). Do not change the dressing unless you have been taught to do so and have shown you are able to change it safely. Flush the PICC as told. Tell your health care provider right away if the PICC is hard to flush or does not flush. Do not use force to flush the PICC. Use clean and germ-free (sterile) supplies only. Keep the supplies in a dry place. Do not reuse needles, syringes, or any other supplies. Reusing supplies can lead to infection. Keep the PICC dressing dry and secure it with tape if the edges stop sticking to your skin. Check your PICC insertion site every day for signs of infection. Check for: Redness, swelling, or pain. Fluid or blood. Warmth. Pus or a bad smell. Preventing other problems Do not use a syringe that is less than 10 mL to flush the PICC. Do not have your blood pressure checked on the arm in which the PICC is placed. Do not ever pull or tug on the PICC. Keep it secured to your arm with tape or a stretch wrap when not in use. Do not take the PICC out yourself. Only a trained health care provider should remove the PICC. Keep pets and children away from your PICC. How to care for your PICC dressing Keep your PICC dressing clean and dry to prevent infection. Do not take baths, swim, or use a hot tub until your health care provider approves. Ask your health care provider if you can take   showers. You may only be allowed to take sponge baths. When you are allowed to shower: Ask your health care provider to teach you how to wrap the PICC. Cover the PICC with clear plastic wrap and tape to keep it dry while showering. Follow instructions from your health care provider about how to take care of your insertion site and dressing. Make sure you: Wash your hands with soap and water for at least 20 seconds before and after you change your dressing. If soap and water are not available, use hand sanitizer. Change your dressing only if taught to do so by your health care  provider. Your PICC dressing needs to be changed if it becomes loose or wet. Leave stitches (sutures), skin glue, or adhesive strips in place. These skin closures may need to stay in place for 2 weeks or longer. If adhesive strip edges start to loosen and curl up, you may trim the loose edges. Do not remove adhesive strips completely unless your health care provider tells you to do that. Follow these instructions at home: Disposal of supplies Throw away any syringes in a disposal container that is meant for sharp items (sharps container). You can buy a sharps container from a pharmacy, or you can make one by using an empty, hard plastic bottle with a lid. Place any used dressings or infusion bags into a plastic bag. Throw that bag in the trash. General instructions  Always carry your PICC identification card or wear a medical alert bracelet. Keep the tube clamped at all times, unless it is being used. Always carry a smooth-edge clamp with you to clamp the PICC if it breaks. Do not use scissors or sharp objects near the tube. You may bend your arm and move it freely. If your PICC is near or at the bend of your elbow, avoid activity with repeated motion at the elbow. Avoid lifting heavy objects as told by your health care provider. Keep all follow-up visits. This is important. You will need to have your PICC dressing changed at least once a week. Contact a health care provider if: You have pain in your arm, ear, face, or teeth. You have a fever or chills. You have redness, swelling, or pain around the insertion site. You have fluid or blood coming from the insertion site. Your insertion site feels warm to the touch. You have pus or a bad smell coming from the insertion site. Your skin feels hard and raised around the insertion site. Your PICC dressing has gotten wet or is coming off and you have not been taught how to change it. Get help right away if: You have problems with your PICC, such as  your PICC: Was tugged or pulled and has partially come out. Do not  push the PICC back in. Cannot be flushed, is hard to flush, or leaks around the insertion site when it is flushed. Makes a flushing sound when it is flushed. Appears to have a hole or tear. Is accidentally pulled all the way out. If this happens, cover the insertion site with a gauze dressing. Do not throw the PICC away. Your health care provider will need to check it to be sure the entire catheter came out. You feel your heart racing or skipping beats, or you have chest pain. You have shortness of breath or trouble breathing. You have swelling, redness, warmth, or pain in the arm in which the PICC is placed. You have a red streak going up your arm that   starts under the PICC dressing. These symptoms may be an emergency. Get help right away. Call 911. Do not wait to see if the symptoms will go away. Do not drive yourself to the hospital. Summary A peripherally inserted central catheter (PICC) is a long, thin, flexible tube (catheter) that is put into a vein in the arm or leg. If cared for properly, a PICC can remain in place for many months. Having a PICC can allow you to go home from the hospital sooner and continue treatment at home. The PICC is inserted using a germ-free (sterile) technique by a specially trained health care provider. Only a trained health care provider should remove it. Do not have your blood pressure checked on the arm in which your PICC is placed. Always keep your PICC identification card with you. This information is not intended to replace advice given to you by your health care provider. Make sure you discuss any questions you have with your health care provider. Document Revised: 10/27/2020 Document Reviewed: 10/27/2020 Elsevier Patient Education  2022 [employer].  

## 2021-05-16 ENCOUNTER — Inpatient Hospital Stay: Payer: PRIVATE HEALTH INSURANCE

## 2021-05-16 ENCOUNTER — Inpatient Hospital Stay (HOSPITAL_BASED_OUTPATIENT_CLINIC_OR_DEPARTMENT_OTHER): Payer: PRIVATE HEALTH INSURANCE | Admitting: Family

## 2021-05-16 ENCOUNTER — Encounter: Payer: Self-pay | Admitting: Family

## 2021-05-16 ENCOUNTER — Other Ambulatory Visit: Payer: Self-pay

## 2021-05-16 VITALS — BP 134/85 | HR 91 | Temp 98.2°F | Resp 17 | Wt 264.0 lb

## 2021-05-16 DIAGNOSIS — C180 Malignant neoplasm of cecum: Secondary | ICD-10-CM

## 2021-05-16 DIAGNOSIS — D509 Iron deficiency anemia, unspecified: Secondary | ICD-10-CM | POA: Diagnosis not present

## 2021-05-16 DIAGNOSIS — I2690 Septic pulmonary embolism without acute cor pulmonale: Secondary | ICD-10-CM | POA: Diagnosis not present

## 2021-05-16 DIAGNOSIS — Z5111 Encounter for antineoplastic chemotherapy: Secondary | ICD-10-CM | POA: Diagnosis not present

## 2021-05-16 LAB — CMP (CANCER CENTER ONLY)
ALT: 37 U/L (ref 0–44)
AST: 26 U/L (ref 15–41)
Albumin: 3.6 g/dL (ref 3.5–5.0)
Alkaline Phosphatase: 102 U/L (ref 38–126)
Anion gap: 10 (ref 5–15)
BUN: 8 mg/dL (ref 6–20)
CO2: 27 mmol/L (ref 22–32)
Calcium: 9.3 mg/dL (ref 8.9–10.3)
Chloride: 105 mmol/L (ref 98–111)
Creatinine: 0.72 mg/dL (ref 0.44–1.00)
GFR, Estimated: >60 mL/min (ref >60)
Glucose, Bld: 117 mg/dL — ABNORMAL HIGH (ref 70–99)
Potassium: 3.2 mmol/L — ABNORMAL LOW (ref 3.5–5.1)
Sodium: 142 mmol/L (ref 135–145)
Total Bilirubin: 0.4 mg/dL (ref 0.3–1.2)
Total Protein: 6.2 g/dL — ABNORMAL LOW (ref 6.5–8.1)

## 2021-05-16 LAB — CBC WITH DIFFERENTIAL (CANCER CENTER ONLY)
Abs Immature Granulocytes: 0.03 10*3/uL (ref 0.00–0.07)
Basophils Absolute: 0 10*3/uL (ref 0.0–0.1)
Basophils Relative: 1 %
Eosinophils Absolute: 0 10*3/uL (ref 0.0–0.5)
Eosinophils Relative: 1 %
HCT: 35.1 % — ABNORMAL LOW (ref 36.0–46.0)
Hemoglobin: 11.3 g/dL — ABNORMAL LOW (ref 12.0–15.0)
Immature Granulocytes: 1 %
Lymphocytes Relative: 49 %
Lymphs Abs: 1.9 10*3/uL (ref 0.7–4.0)
MCH: 29.7 pg (ref 26.0–34.0)
MCHC: 32.2 g/dL (ref 30.0–36.0)
MCV: 92.4 fL (ref 80.0–100.0)
Monocytes Absolute: 0.6 10*3/uL (ref 0.1–1.0)
Monocytes Relative: 17 %
Neutro Abs: 1.1 10*3/uL — ABNORMAL LOW (ref 1.7–7.7)
Neutrophils Relative %: 31 %
Platelet Count: 102 10*3/uL — ABNORMAL LOW (ref 150–400)
RBC: 3.8 MIL/uL — ABNORMAL LOW (ref 3.87–5.11)
RDW: 15.5 % (ref 11.5–15.5)
WBC Count: 3.7 10*3/uL — ABNORMAL LOW (ref 4.0–10.5)
nRBC: 0 % (ref 0.0–0.2)

## 2021-05-16 LAB — IRON AND IRON BINDING CAPACITY (CC-WL,HP ONLY)
Iron: 61 ug/dL (ref 28–170)
Saturation Ratios: 22 % (ref 10.4–31.8)
TIBC: 274 ug/dL (ref 250–450)
UIBC: 213 ug/dL (ref 148–442)

## 2021-05-16 LAB — CEA (IN HOUSE-CHCC): CEA (CHCC-In House): 6.02 ng/mL — ABNORMAL HIGH (ref 0.00–5.00)

## 2021-05-16 LAB — FERRITIN: Ferritin: 676 ng/mL — ABNORMAL HIGH (ref 11–307)

## 2021-05-16 MED ORDER — PALONOSETRON HCL INJECTION 0.25 MG/5ML
0.2500 mg | Freq: Once | INTRAVENOUS | Status: AC
  Administered 2021-05-16: 0.25 mg via INTRAVENOUS
  Filled 2021-05-16: qty 5

## 2021-05-16 MED ORDER — DEXTROSE 5 % IV SOLN: Freq: Once | INTRAVENOUS | Status: DC

## 2021-05-16 MED ORDER — SODIUM CHLORIDE 0.9 % IV SOLN
400.0000 mg/m2 | Freq: Once | INTRAVENOUS | Status: AC
  Administered 2021-05-16: 956 mg via INTRAVENOUS
  Filled 2021-05-16: qty 47.8

## 2021-05-16 MED ORDER — SODIUM CHLORIDE 0.9 % IV SOLN
150.0000 mg | Freq: Once | INTRAVENOUS | Status: AC
  Administered 2021-05-16: 150 mg via INTRAVENOUS
  Filled 2021-05-16: qty 150

## 2021-05-16 MED ORDER — SODIUM CHLORIDE 0.9 % IV SOLN: INTRAVENOUS | Status: DC

## 2021-05-16 MED ORDER — ATROPINE SULFATE 1 MG/ML IV SOLN
0.5000 mg | Freq: Once | INTRAVENOUS | Status: AC | PRN
  Administered 2021-05-16: 0.5 mg via INTRAVENOUS
  Filled 2021-05-16: qty 1

## 2021-05-16 MED ORDER — SODIUM CHLORIDE 0.9 % IV SOLN
2400.0000 mg/m2 | INTRAVENOUS | Status: DC
  Administered 2021-05-16: 5750 mg via INTRAVENOUS
  Filled 2021-05-16: qty 115

## 2021-05-16 MED ORDER — SODIUM CHLORIDE 0.9 % IV SOLN: Freq: Once | INTRAVENOUS | Status: AC

## 2021-05-16 MED ORDER — SODIUM CHLORIDE 0.9 % IV SOLN
165.0000 mg/m2 | Freq: Once | INTRAVENOUS | Status: AC
  Administered 2021-05-16: 400 mg via INTRAVENOUS
  Filled 2021-05-16: qty 20

## 2021-05-16 MED ORDER — SODIUM CHLORIDE 0.9 % IV SOLN
10.0000 mg | Freq: Once | INTRAVENOUS | Status: AC
  Administered 2021-05-16: 10 mg via INTRAVENOUS
  Filled 2021-05-16: qty 10

## 2021-05-16 NOTE — Patient Instructions (Signed)

## 2021-05-16 NOTE — Progress Notes (Signed)
Ok to treat with ANC of 1.1 per Dr Ennever. dph 

## 2021-05-16 NOTE — Progress Notes (Signed)
Hematology and Oncology Follow Up Visit  Lauren Mcpherson [id number] 09-Dec-1965 56 y.o. 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 daily-start on 01/22/2021   Interim History:  Lauren Mcpherson is here today for follow-up and treatment. She is doing well and has no complaints at this time.  CEA earlier this month was 3.89.  No fever, chills, n/v, cough, rash, dizziness, SOB, chest pain, pain, palpitations, abdominal pain or changes in bowel or bladder habits.  She is tolerating Xarelto nicely. No blood loss noted. No bruising or petechiae.  No swelling or tenderness in her extremities at this time.  The neuropathy in her feet is unchanged from baseline.  No falls or syncope to report.  She has maintained a good appetite and is staying well hydrated. Her weight is 264 lbs (previously 268 lbs). She has cut down on sugar intake and has increased her fiber intake.   ECOG Performance Status: 1 - Symptomatic but completely ambulatory  Medications:  Allergies as of 05/16/2021   No Known Allergies      Medication List        Accurate as of May 16, 2021 10:04 AM. If you have any questions, ask your nurse or doctor.          dexamethasone 6 MG tablet Commonly known as: DECADRON Take 1 tablet (6 mg total) by mouth See admin instructions. Take 6 mg by mouth daily for 3 days, starting the day after chemotherapy. Take with food.   loperamide 2 MG tablet Commonly known as: Imodium A-D Take 2 at onset of diarrhea, then 1 every 2hrs until 12hr without a BM. May take 2 tab every 4hrs at bedtime. If diarrhea recurs repeat. What changed:  how much to take how to take this when to take this additional instructions   ondansetron 8 MG tablet Commonly known  as: Zofran Take 1 tablet (8 mg total) by mouth 2 (two) times daily as needed. Start on day 3 after chemotherapy. What changed:  when to take this additional instructions   prochlorperazine 10 MG tablet Commonly known as: COMPAZINE Take 1 tablet (10 mg total) by mouth every 6 (six) hours as needed (Nausea or vomiting). What changed: reasons to take this   rivaroxaban 20 MG Tabs tablet Commonly known as: XARELTO Take 20 mg by mouth daily with supper.   rivaroxaban 20 MG Tabs tablet Commonly known as: XARELTO Take 1 tablet (20 mg total) by mouth daily with supper.        Allergies: No Known Allergies  Past Medical History, Surgical history, Social history, and Family History were reviewed and updated.  Review of Systems: All other 10 point review of systems is negative.   Physical Exam:  vitals were not taken for this visit.   Wt Readings from Last 3 Encounters:  05/13/21 267 lb 6.4 oz (121.3 kg)  05/04/21 268 lb (121.6 kg)  04/13/21 274 lb (124.3 kg)    Ocular: Sclerae unicteric, pupils equal, round and reactive to light Ear-nose-throat: Oropharynx clear, dentition fair Lymphatic: No cervical or supraclavicular adenopathy Lungs no rales or rhonchi, good excursion bilaterally Heart regular rate and rhythm, no murmur appreciated Abd soft, nontender, positive bowel sounds, no liver or spleen tip palpated on exam, no fluid  wave  MSK no focal spinal tenderness, no joint edema Neuro: non-focal, well-oriented, appropriate affect Breasts: Deferred   Lab Results  Component Value Date   WBC 3.7 (L) 05/16/2021   HGB 11.3 (L) 05/16/2021   HCT 35.1 (L) 05/16/2021   MCV 92.4 05/16/2021   PLT 102 (L) 05/16/2021   Lab Results  Component Value Date   FERRITIN 176 01/04/2021   IRON 69 01/04/2021   TIBC 244 (L) 01/04/2021   UIBC 175 01/04/2021   IRONPCTSAT 28 01/04/2021   Lab Results  Component Value Date   RETICCTPCT 3.0 01/04/2021   RBC 3.80 (L) 05/16/2021   No  results found for: KPAFRELGTCHN, LAMBDASER, KAPLAMBRATIO No results found for: IGGSERUM, IGA, IGMSERUM No results found for: Odetta Pink, SPEI   Chemistry      Component Value Date/Time   NA 142 05/04/2021 0846   K 3.2 (L) 05/04/2021 0846   CL 105 05/04/2021 0846   CO2 28 05/04/2021 0846   BUN 6 05/04/2021 0846   CREATININE 0.67 05/04/2021 0846      Component Value Date/Time   CALCIUM 9.5 05/04/2021 0846   ALKPHOS 86 05/04/2021 0846   AST 19 05/04/2021 0846   ALT 21 05/04/2021 0846   BILITOT 0.4 05/04/2021 0846       Impression and Plan: Lauren Mcpherson is a very pleasant 56 yo African American female with metastatic adenocarcinoma of the cecum. 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  Her CEA has remained stable. Her MRI back in December showed continued response and no new or progressive disease.  We will discontinue Oxaliplatin at this time due to neuropathy per Dr. Marin Olp.  We will proceed with treatment today as planned.  We will hold off on repeating her CT angio for now and she will continue her same regimen with Xarelto.  Iron studies pending.  Follow-up in 2 weeks.   Lauren Dawson, NP 1/23/202310:04 AM

## 2021-05-18 ENCOUNTER — Inpatient Hospital Stay: Payer: PRIVATE HEALTH INSURANCE

## 2021-05-18 ENCOUNTER — Other Ambulatory Visit: Payer: Self-pay

## 2021-05-18 VITALS — BP 115/82 | HR 92 | Temp 97.8°F | Resp 18

## 2021-05-18 DIAGNOSIS — Z5111 Encounter for antineoplastic chemotherapy: Secondary | ICD-10-CM | POA: Diagnosis not present

## 2021-05-18 DIAGNOSIS — C180 Malignant neoplasm of cecum: Secondary | ICD-10-CM

## 2021-05-18 MED ORDER — SODIUM CHLORIDE 0.9% FLUSH
10.0000 mL | Freq: Once | INTRAVENOUS | Status: AC
  Administered 2021-05-18: 13:00:00 10 mL via INTRAVENOUS

## 2021-05-18 MED ORDER — HEPARIN SOD (PORK) LOCK FLUSH 100 UNIT/ML IV SOLN
500.0000 [IU] | Freq: Once | INTRAVENOUS | Status: AC
  Administered 2021-05-18: 13:00:00 500 [IU] via INTRAVENOUS

## 2021-05-18 NOTE — Patient Instructions (Signed)
PICC Home Care Guide A peripherally inserted central catheter (PICC) is a form of IV access that allows medicines and IV fluids to be quickly put into the blood and spread throughout the body. The PICC is a long, thin, flexible tube (catheter) that is put into a vein in a person's arm or leg. The catheter ends in a large vein just outside the heart called the superior vena cava (SVC). After the PICC is put in, a chest X-ray may be done to make sure that it is in the right place. A PICC may be placed for different reasons, such as: To give medicines and liquid nutrition. To give IV fluids and blood products. To take blood samples often. If there is trouble placing a peripheral intravenous (PIV) catheter. If cared for properly, a PICC can remain in place for many months. Having a PICC can allow you to go home from the hospital sooner and continue treatment at home. Medicines and PICC care can be managed at home by a family member, caregiver, or home health care team. What are the risks? Generally, having a PICC is safe. However, problems may occur, including: A blood clot (thrombus) forming in or at the end of the PICC. A blood clot forming in a vein (deep vein thrombosis) or traveling to the lung (pulmonary embolism). Inflammation of the vein (phlebitis) in which the PICC is placed. Infection at the insertion site or in the blood. Blood infections from central lines, like PICCs, can be serious and often require a hospital stay. PICC malposition, or PICC movement or poor placement. A break or cut in the PICC. Do not use scissors near the PICC. Nerve or tendon irritation or injury during PICC insertion. How to care for your PICC Please follow the specific guidelines provided by your health care provider. Preventing infection You and any caregivers should wash your hands often with soap and water for at least 20 seconds. Wash hands: Before touching the PICC or the infusion device. Before changing a  bandage (dressing). Do not change the dressing unless you have been taught to do so and have shown you are able to change it safely. Flush the PICC as told. Tell your health care provider right away if the PICC is hard to flush or does not flush. Do not use force to flush the PICC. Use clean and germ-free (sterile) supplies only. Keep the supplies in a dry place. Do not reuse needles, syringes, or any other supplies. Reusing supplies can lead to infection. Keep the PICC dressing dry and secure it with tape if the edges stop sticking to your skin. Check your PICC insertion site every day for signs of infection. Check for: Redness, swelling, or pain. Fluid or blood. Warmth. Pus or a bad smell. Preventing other problems Do not use a syringe that is less than 10 mL to flush the PICC. Do not have your blood pressure checked on the arm in which the PICC is placed. Do not ever pull or tug on the PICC. Keep it secured to your arm with tape or a stretch wrap when not in use. Do not take the PICC out yourself. Only a trained health care provider should remove the PICC. Keep pets and children away from your PICC. How to care for your PICC dressing Keep your PICC dressing clean and dry to prevent infection. Do not take baths, swim, or use a hot tub until your health care provider approves. Ask your health care provider if you can take   showers. You may only be allowed to take sponge baths. When you are allowed to shower: Ask your health care provider to teach you how to wrap the PICC. Cover the PICC with clear plastic wrap and tape to keep it dry while showering. Follow instructions from your health care provider about how to take care of your insertion site and dressing. Make sure you: Wash your hands with soap and water for at least 20 seconds before and after you change your dressing. If soap and water are not available, use hand sanitizer. Change your dressing only if taught to do so by your health care  provider. Your PICC dressing needs to be changed if it becomes loose or wet. Leave stitches (sutures), skin glue, or adhesive strips in place. These skin closures may need to stay in place for 2 weeks or longer. If adhesive strip edges start to loosen and curl up, you may trim the loose edges. Do not remove adhesive strips completely unless your health care provider tells you to do that. Follow these instructions at home: Disposal of supplies Throw away any syringes in a disposal container that is meant for sharp items (sharps container). You can buy a sharps container from a pharmacy, or you can make one by using an empty, hard plastic bottle with a lid. Place any used dressings or infusion bags into a plastic bag. Throw that bag in the trash. General instructions  Always carry your PICC identification card or wear a medical alert bracelet. Keep the tube clamped at all times, unless it is being used. Always carry a smooth-edge clamp with you to clamp the PICC if it breaks. Do not use scissors or sharp objects near the tube. You may bend your arm and move it freely. If your PICC is near or at the bend of your elbow, avoid activity with repeated motion at the elbow. Avoid lifting heavy objects as told by your health care provider. Keep all follow-up visits. This is important. You will need to have your PICC dressing changed at least once a week. Contact a health care provider if: You have pain in your arm, ear, face, or teeth. You have a fever or chills. You have redness, swelling, or pain around the insertion site. You have fluid or blood coming from the insertion site. Your insertion site feels warm to the touch. You have pus or a bad smell coming from the insertion site. Your skin feels hard and raised around the insertion site. Your PICC dressing has gotten wet or is coming off and you have not been taught how to change it. Get help right away if: You have problems with your PICC, such as  your PICC: Was tugged or pulled and has partially come out. Do not  push the PICC back in. Cannot be flushed, is hard to flush, or leaks around the insertion site when it is flushed. Makes a flushing sound when it is flushed. Appears to have a hole or tear. Is accidentally pulled all the way out. If this happens, cover the insertion site with a gauze dressing. Do not throw the PICC away. Your health care provider will need to check it to be sure the entire catheter came out. You feel your heart racing or skipping beats, or you have chest pain. You have shortness of breath or trouble breathing. You have swelling, redness, warmth, or pain in the arm in which the PICC is placed. You have a red streak going up your arm that   starts under the PICC dressing. These symptoms may be an emergency. Get help right away. Call 911. Do not wait to see if the symptoms will go away. Do not drive yourself to the hospital. Summary A peripherally inserted central catheter (PICC) is a long, thin, flexible tube (catheter) that is put into a vein in the arm or leg. If cared for properly, a PICC can remain in place for many months. Having a PICC can allow you to go home from the hospital sooner and continue treatment at home. The PICC is inserted using a germ-free (sterile) technique by a specially trained health care provider. Only a trained health care provider should remove it. Do not have your blood pressure checked on the arm in which your PICC is placed. Always keep your PICC identification card with you. This information is not intended to replace advice given to you by your health care provider. Make sure you discuss any questions you have with your health care provider. Document Revised: 10/27/2020 Document Reviewed: 10/27/2020 Elsevier Patient Education  2022 [employer].  

## 2021-05-20 ENCOUNTER — Inpatient Hospital Stay: Payer: PRIVATE HEALTH INSURANCE

## 2021-05-20 ENCOUNTER — Other Ambulatory Visit: Payer: Self-pay

## 2021-05-20 DIAGNOSIS — Z5111 Encounter for antineoplastic chemotherapy: Secondary | ICD-10-CM | POA: Diagnosis not present

## 2021-05-20 NOTE — Patient Instructions (Signed)
PICC Home Care Guide A peripherally inserted central catheter (PICC) is a form of IV access that allows medicines and IV fluids to be quickly put into the blood and spread throughout the body. The PICC is a long, thin, flexible tube (catheter) that is put into a vein in a person's arm or leg. The catheter ends in a large vein just outside the heart called the superior vena cava (SVC). After the PICC is put in, a chest X-ray may be done to make sure that it is in the right place. A PICC may be placed for different reasons, such as: To give medicines and liquid nutrition. To give IV fluids and blood products. To take blood samples often. If there is trouble placing a peripheral intravenous (PIV) catheter. If cared for properly, a PICC can remain in place for many months. Having a PICC can allow you to go home from the hospital sooner and continue treatment at home. Medicines and PICC care can be managed at home by a family member, caregiver, or home health care team. What are the risks? Generally, having a PICC is safe. However, problems may occur, including: A blood clot (thrombus) forming in or at the end of the PICC. A blood clot forming in a vein (deep vein thrombosis) or traveling to the lung (pulmonary embolism). Inflammation of the vein (phlebitis) in which the PICC is placed. Infection at the insertion site or in the blood. Blood infections from central lines, like PICCs, can be serious and often require a hospital stay. PICC malposition, or PICC movement or poor placement. A break or cut in the PICC. Do not use scissors near the PICC. Nerve or tendon irritation or injury during PICC insertion. How to care for your PICC Please follow the specific guidelines provided by your health care provider. Preventing infection You and any caregivers should wash your hands often with soap and water for at least 20 seconds. Wash hands: Before touching the PICC or the infusion device. Before changing a  bandage (dressing). Do not change the dressing unless you have been taught to do so and have shown you are able to change it safely. Flush the PICC as told. Tell your health care provider right away if the PICC is hard to flush or does not flush. Do not use force to flush the PICC. Use clean and germ-free (sterile) supplies only. Keep the supplies in a dry place. Do not reuse needles, syringes, or any other supplies. Reusing supplies can lead to infection. Keep the PICC dressing dry and secure it with tape if the edges stop sticking to your skin. Check your PICC insertion site every day for signs of infection. Check for: Redness, swelling, or pain. Fluid or blood. Warmth. Pus or a bad smell. Preventing other problems Do not use a syringe that is less than 10 mL to flush the PICC. Do not have your blood pressure checked on the arm in which the PICC is placed. Do not ever pull or tug on the PICC. Keep it secured to your arm with tape or a stretch wrap when not in use. Do not take the PICC out yourself. Only a trained health care provider should remove the PICC. Keep pets and children away from your PICC. How to care for your PICC dressing Keep your PICC dressing clean and dry to prevent infection. Do not take baths, swim, or use a hot tub until your health care provider approves. Ask your health care provider if you can take   showers. You may only be allowed to take sponge baths. When you are allowed to shower: Ask your health care provider to teach you how to wrap the PICC. Cover the PICC with clear plastic wrap and tape to keep it dry while showering. Follow instructions from your health care provider about how to take care of your insertion site and dressing. Make sure you: Wash your hands with soap and water for at least 20 seconds before and after you change your dressing. If soap and water are not available, use hand sanitizer. Change your dressing only if taught to do so by your health care  provider. Your PICC dressing needs to be changed if it becomes loose or wet. Leave stitches (sutures), skin glue, or adhesive strips in place. These skin closures may need to stay in place for 2 weeks or longer. If adhesive strip edges start to loosen and curl up, you may trim the loose edges. Do not remove adhesive strips completely unless your health care provider tells you to do that. Follow these instructions at home: Disposal of supplies Throw away any syringes in a disposal container that is meant for sharp items (sharps container). You can buy a sharps container from a pharmacy, or you can make one by using an empty, hard plastic bottle with a lid. Place any used dressings or infusion bags into a plastic bag. Throw that bag in the trash. General instructions  Always carry your PICC identification card or wear a medical alert bracelet. Keep the tube clamped at all times, unless it is being used. Always carry a smooth-edge clamp with you to clamp the PICC if it breaks. Do not use scissors or sharp objects near the tube. You may bend your arm and move it freely. If your PICC is near or at the bend of your elbow, avoid activity with repeated motion at the elbow. Avoid lifting heavy objects as told by your health care provider. Keep all follow-up visits. This is important. You will need to have your PICC dressing changed at least once a week. Contact a health care provider if: You have pain in your arm, ear, face, or teeth. You have a fever or chills. You have redness, swelling, or pain around the insertion site. You have fluid or blood coming from the insertion site. Your insertion site feels warm to the touch. You have pus or a bad smell coming from the insertion site. Your skin feels hard and raised around the insertion site. Your PICC dressing has gotten wet or is coming off and you have not been taught how to change it. Get help right away if: You have problems with your PICC, such as  your PICC: Was tugged or pulled and has partially come out. Do not  push the PICC back in. Cannot be flushed, is hard to flush, or leaks around the insertion site when it is flushed. Makes a flushing sound when it is flushed. Appears to have a hole or tear. Is accidentally pulled all the way out. If this happens, cover the insertion site with a gauze dressing. Do not throw the PICC away. Your health care provider will need to check it to be sure the entire catheter came out. You feel your heart racing or skipping beats, or you have chest pain. You have shortness of breath or trouble breathing. You have swelling, redness, warmth, or pain in the arm in which the PICC is placed. You have a red streak going up your arm that   starts under the PICC dressing. These symptoms may be an emergency. Get help right away. Call 911. Do not wait to see if the symptoms will go away. Do not drive yourself to the hospital. Summary A peripherally inserted central catheter (PICC) is a long, thin, flexible tube (catheter) that is put into a vein in the arm or leg. If cared for properly, a PICC can remain in place for many months. Having a PICC can allow you to go home from the hospital sooner and continue treatment at home. The PICC is inserted using a germ-free (sterile) technique by a specially trained health care provider. Only a trained health care provider should remove it. Do not have your blood pressure checked on the arm in which your PICC is placed. Always keep your PICC identification card with you. This information is not intended to replace advice given to you by your health care provider. Make sure you discuss any questions you have with your health care provider. Document Revised: 10/27/2020 Document Reviewed: 10/27/2020 Elsevier Patient Education  2022 [employer].  

## 2021-05-23 ENCOUNTER — Inpatient Hospital Stay: Payer: PRIVATE HEALTH INSURANCE

## 2021-05-23 ENCOUNTER — Other Ambulatory Visit: Payer: Self-pay

## 2021-05-23 VITALS — BP 129/73 | HR 102 | Temp 98.7°F | Resp 18

## 2021-05-23 DIAGNOSIS — Z5111 Encounter for antineoplastic chemotherapy: Secondary | ICD-10-CM | POA: Diagnosis not present

## 2021-05-23 DIAGNOSIS — C180 Malignant neoplasm of cecum: Secondary | ICD-10-CM

## 2021-05-23 MED ORDER — HEPARIN SOD (PORK) LOCK FLUSH 100 UNIT/ML IV SOLN
500.0000 [IU] | Freq: Once | INTRAVENOUS | Status: AC
  Administered 2021-05-23: 250 [IU]

## 2021-05-23 MED ORDER — SODIUM CHLORIDE 0.9% FLUSH
10.0000 mL | Freq: Once | INTRAVENOUS | Status: AC
  Administered 2021-05-23: 10 mL

## 2021-05-23 NOTE — Patient Instructions (Signed)
PICC Home Care Guide A peripherally inserted central catheter (PICC) is a form of IV access that allows medicines and IV fluids to be quickly put into the blood and spread throughout the body. The PICC is a long, thin, flexible tube (catheter) that is put into a vein in a person's arm or leg. The catheter ends in a large vein just outside the heart called the superior vena cava (SVC). After the PICC is put in, a chest X-ray may be done to make sure that it is in the right place. A PICC may be placed for different reasons, such as: To give medicines and liquid nutrition. To give IV fluids and blood products. To take blood samples often. If there is trouble placing a peripheral intravenous (PIV) catheter. If cared for properly, a PICC can remain in place for many months. Having a PICC can allow you to go home from the hospital sooner and continue treatment at home. Medicines and PICC care can be managed at home by a family member, caregiver, or home health care team. What are the risks? Generally, having a PICC is safe. However, problems may occur, including: A blood clot (thrombus) forming in or at the end of the PICC. A blood clot forming in a vein (deep vein thrombosis) or traveling to the lung (pulmonary embolism). Inflammation of the vein (phlebitis) in which the PICC is placed. Infection at the insertion site or in the blood. Blood infections from central lines, like PICCs, can be serious and often require a hospital stay. PICC malposition, or PICC movement or poor placement. A break or cut in the PICC. Do not use scissors near the PICC. Nerve or tendon irritation or injury during PICC insertion. How to care for your PICC Please follow the specific guidelines provided by your health care provider. Preventing infection You and any caregivers should wash your hands often with soap and water for at least 20 seconds. Wash hands: Before touching the PICC or the infusion device. Before changing a  bandage (dressing). Do not change the dressing unless you have been taught to do so and have shown you are able to change it safely. Flush the PICC as told. Tell your health care provider right away if the PICC is hard to flush or does not flush. Do not use force to flush the PICC. Use clean and germ-free (sterile) supplies only. Keep the supplies in a dry place. Do not reuse needles, syringes, or any other supplies. Reusing supplies can lead to infection. Keep the PICC dressing dry and secure it with tape if the edges stop sticking to your skin. Check your PICC insertion site every day for signs of infection. Check for: Redness, swelling, or pain. Fluid or blood. Warmth. Pus or a bad smell. Preventing other problems Do not use a syringe that is less than 10 mL to flush the PICC. Do not have your blood pressure checked on the arm in which the PICC is placed. Do not ever pull or tug on the PICC. Keep it secured to your arm with tape or a stretch wrap when not in use. Do not take the PICC out yourself. Only a trained health care provider should remove the PICC. Keep pets and children away from your PICC. How to care for your PICC dressing Keep your PICC dressing clean and dry to prevent infection. Do not take baths, swim, or use a hot tub until your health care provider approves. Ask your health care provider if you can take   showers. You may only be allowed to take sponge baths. When you are allowed to shower: Ask your health care provider to teach you how to wrap the PICC. Cover the PICC with clear plastic wrap and tape to keep it dry while showering. Follow instructions from your health care provider about how to take care of your insertion site and dressing. Make sure you: Wash your hands with soap and water for at least 20 seconds before and after you change your dressing. If soap and water are not available, use hand sanitizer. Change your dressing only if taught to do so by your health care  provider. Your PICC dressing needs to be changed if it becomes loose or wet. Leave stitches (sutures), skin glue, or adhesive strips in place. These skin closures may need to stay in place for 2 weeks or longer. If adhesive strip edges start to loosen and curl up, you may trim the loose edges. Do not remove adhesive strips completely unless your health care provider tells you to do that. Follow these instructions at home: Disposal of supplies Throw away any syringes in a disposal container that is meant for sharp items (sharps container). You can buy a sharps container from a pharmacy, or you can make one by using an empty, hard plastic bottle with a lid. Place any used dressings or infusion bags into a plastic bag. Throw that bag in the trash. General instructions  Always carry your PICC identification card or wear a medical alert bracelet. Keep the tube clamped at all times, unless it is being used. Always carry a smooth-edge clamp with you to clamp the PICC if it breaks. Do not use scissors or sharp objects near the tube. You may bend your arm and move it freely. If your PICC is near or at the bend of your elbow, avoid activity with repeated motion at the elbow. Avoid lifting heavy objects as told by your health care provider. Keep all follow-up visits. This is important. You will need to have your PICC dressing changed at least once a week. Contact a health care provider if: You have pain in your arm, ear, face, or teeth. You have a fever or chills. You have redness, swelling, or pain around the insertion site. You have fluid or blood coming from the insertion site. Your insertion site feels warm to the touch. You have pus or a bad smell coming from the insertion site. Your skin feels hard and raised around the insertion site. Your PICC dressing has gotten wet or is coming off and you have not been taught how to change it. Get help right away if: You have problems with your PICC, such as  your PICC: Was tugged or pulled and has partially come out. Do not  push the PICC back in. Cannot be flushed, is hard to flush, or leaks around the insertion site when it is flushed. Makes a flushing sound when it is flushed. Appears to have a hole or tear. Is accidentally pulled all the way out. If this happens, cover the insertion site with a gauze dressing. Do not throw the PICC away. Your health care provider will need to check it to be sure the entire catheter came out. You feel your heart racing or skipping beats, or you have chest pain. You have shortness of breath or trouble breathing. You have swelling, redness, warmth, or pain in the arm in which the PICC is placed. You have a red streak going up your arm that   starts under the PICC dressing. These symptoms may be an emergency. Get help right away. Call 911. Do not wait to see if the symptoms will go away. Do not drive yourself to the hospital. Summary A peripherally inserted central catheter (PICC) is a long, thin, flexible tube (catheter) that is put into a vein in the arm or leg. If cared for properly, a PICC can remain in place for many months. Having a PICC can allow you to go home from the hospital sooner and continue treatment at home. The PICC is inserted using a germ-free (sterile) technique by a specially trained health care provider. Only a trained health care provider should remove it. Do not have your blood pressure checked on the arm in which your PICC is placed. Always keep your PICC identification card with you. This information is not intended to replace advice given to you by your health care provider. Make sure you discuss any questions you have with your health care provider. Document Revised: 10/27/2020 Document Reviewed: 10/27/2020 Elsevier Patient Education  2022 [employer].  

## 2021-05-24 ENCOUNTER — Other Ambulatory Visit: Payer: Self-pay | Admitting: Hematology & Oncology

## 2021-05-25 ENCOUNTER — Encounter: Payer: Self-pay | Admitting: Family

## 2021-05-25 ENCOUNTER — Other Ambulatory Visit: Payer: Self-pay

## 2021-05-25 ENCOUNTER — Encounter: Payer: Self-pay | Admitting: Hematology & Oncology

## 2021-05-25 ENCOUNTER — Inpatient Hospital Stay: Payer: PRIVATE HEALTH INSURANCE | Attending: Hematology & Oncology

## 2021-05-25 DIAGNOSIS — Z5111 Encounter for antineoplastic chemotherapy: Secondary | ICD-10-CM | POA: Insufficient documentation

## 2021-05-25 DIAGNOSIS — Z7901 Long term (current) use of anticoagulants: Secondary | ICD-10-CM | POA: Insufficient documentation

## 2021-05-25 DIAGNOSIS — Z86718 Personal history of other venous thrombosis and embolism: Secondary | ICD-10-CM | POA: Diagnosis not present

## 2021-05-25 DIAGNOSIS — C180 Malignant neoplasm of cecum: Secondary | ICD-10-CM | POA: Diagnosis present

## 2021-05-25 DIAGNOSIS — Z452 Encounter for adjustment and management of vascular access device: Secondary | ICD-10-CM | POA: Diagnosis not present

## 2021-05-25 DIAGNOSIS — Z5189 Encounter for other specified aftercare: Secondary | ICD-10-CM | POA: Diagnosis not present

## 2021-05-25 DIAGNOSIS — C787 Secondary malignant neoplasm of liver and intrahepatic bile duct: Secondary | ICD-10-CM | POA: Diagnosis not present

## 2021-05-25 NOTE — Patient Instructions (Signed)
PICC Home Care Guide A peripherally inserted central catheter (PICC) is a form of IV access that allows medicines and IV fluids to be quickly put into the blood and spread throughout the body. The PICC is a long, thin, flexible tube (catheter) that is put into a vein in a person's arm or leg. The catheter ends in a large vein just outside the heart called the superior vena cava (SVC). After the PICC is put in, a chest X-ray may be done to make sure that it is in the right place. A PICC may be placed for different reasons, such as: To give medicines and liquid nutrition. To give IV fluids and blood products. To take blood samples often. If there is trouble placing a peripheral intravenous (PIV) catheter. If cared for properly, a PICC can remain in place for many months. Having a PICC can allow you to go home from the hospital sooner and continue treatment at home. Medicines and PICC care can be managed at home by a family member, caregiver, or home health care team. What are the risks? Generally, having a PICC is safe. However, problems may occur, including: A blood clot (thrombus) forming in or at the end of the PICC. A blood clot forming in a vein (deep vein thrombosis) or traveling to the lung (pulmonary embolism). Inflammation of the vein (phlebitis) in which the PICC is placed. Infection at the insertion site or in the blood. Blood infections from central lines, like PICCs, can be serious and often require a hospital stay. PICC malposition, or PICC movement or poor placement. A break or cut in the PICC. Do not use scissors near the PICC. Nerve or tendon irritation or injury during PICC insertion. How to care for your PICC Please follow the specific guidelines provided by your health care provider. Preventing infection You and any caregivers should wash your hands often with soap and water for at least 20 seconds. Wash hands: Before touching the PICC or the infusion device. Before changing a  bandage (dressing). Do not change the dressing unless you have been taught to do so and have shown you are able to change it safely. Flush the PICC as told. Tell your health care provider right away if the PICC is hard to flush or does not flush. Do not use force to flush the PICC. Use clean and germ-free (sterile) supplies only. Keep the supplies in a dry place. Do not reuse needles, syringes, or any other supplies. Reusing supplies can lead to infection. Keep the PICC dressing dry and secure it with tape if the edges stop sticking to your skin. Check your PICC insertion site every day for signs of infection. Check for: Redness, swelling, or pain. Fluid or blood. Warmth. Pus or a bad smell. Preventing other problems Do not use a syringe that is less than 10 mL to flush the PICC. Do not have your blood pressure checked on the arm in which the PICC is placed. Do not ever pull or tug on the PICC. Keep it secured to your arm with tape or a stretch wrap when not in use. Do not take the PICC out yourself. Only a trained health care provider should remove the PICC. Keep pets and children away from your PICC. How to care for your PICC dressing Keep your PICC dressing clean and dry to prevent infection. Do not take baths, swim, or use a hot tub until your health care provider approves. Ask your health care provider if you can take   showers. You may only be allowed to take sponge baths. When you are allowed to shower: Ask your health care provider to teach you how to wrap the PICC. Cover the PICC with clear plastic wrap and tape to keep it dry while showering. Follow instructions from your health care provider about how to take care of your insertion site and dressing. Make sure you: Wash your hands with soap and water for at least 20 seconds before and after you change your dressing. If soap and water are not available, use hand sanitizer. Change your dressing only if taught to do so by your health care  provider. Your PICC dressing needs to be changed if it becomes loose or wet. Leave stitches (sutures), skin glue, or adhesive strips in place. These skin closures may need to stay in place for 2 weeks or longer. If adhesive strip edges start to loosen and curl up, you may trim the loose edges. Do not remove adhesive strips completely unless your health care provider tells you to do that. Follow these instructions at home: Disposal of supplies Throw away any syringes in a disposal container that is meant for sharp items (sharps container). You can buy a sharps container from a pharmacy, or you can make one by using an empty, hard plastic bottle with a lid. Place any used dressings or infusion bags into a plastic bag. Throw that bag in the trash. General instructions  Always carry your PICC identification card or wear a medical alert bracelet. Keep the tube clamped at all times, unless it is being used. Always carry a smooth-edge clamp with you to clamp the PICC if it breaks. Do not use scissors or sharp objects near the tube. You may bend your arm and move it freely. If your PICC is near or at the bend of your elbow, avoid activity with repeated motion at the elbow. Avoid lifting heavy objects as told by your health care provider. Keep all follow-up visits. This is important. You will need to have your PICC dressing changed at least once a week. Contact a health care provider if: You have pain in your arm, ear, face, or teeth. You have a fever or chills. You have redness, swelling, or pain around the insertion site. You have fluid or blood coming from the insertion site. Your insertion site feels warm to the touch. You have pus or a bad smell coming from the insertion site. Your skin feels hard and raised around the insertion site. Your PICC dressing has gotten wet or is coming off and you have not been taught how to change it. Get help right away if: You have problems with your PICC, such as  your PICC: Was tugged or pulled and has partially come out. Do not  push the PICC back in. Cannot be flushed, is hard to flush, or leaks around the insertion site when it is flushed. Makes a flushing sound when it is flushed. Appears to have a hole or tear. Is accidentally pulled all the way out. If this happens, cover the insertion site with a gauze dressing. Do not throw the PICC away. Your health care provider will need to check it to be sure the entire catheter came out. You feel your heart racing or skipping beats, or you have chest pain. You have shortness of breath or trouble breathing. You have swelling, redness, warmth, or pain in the arm in which the PICC is placed. You have a red streak going up your arm that   starts under the PICC dressing. These symptoms may be an emergency. Get help right away. Call 911. Do not wait to see if the symptoms will go away. Do not drive yourself to the hospital. Summary A peripherally inserted central catheter (PICC) is a long, thin, flexible tube (catheter) that is put into a vein in the arm or leg. If cared for properly, a PICC can remain in place for many months. Having a PICC can allow you to go home from the hospital sooner and continue treatment at home. The PICC is inserted using a germ-free (sterile) technique by a specially trained health care provider. Only a trained health care provider should remove it. Do not have your blood pressure checked on the arm in which your PICC is placed. Always keep your PICC identification card with you. This information is not intended to replace advice given to you by your health care provider. Make sure you discuss any questions you have with your health care provider. Document Revised: 10/27/2020 Document Reviewed: 10/27/2020 Elsevier Patient Education  2022 [employer].  

## 2021-05-27 ENCOUNTER — Inpatient Hospital Stay: Payer: PRIVATE HEALTH INSURANCE

## 2021-05-27 ENCOUNTER — Other Ambulatory Visit: Payer: Self-pay

## 2021-05-27 VITALS — BP 130/91 | HR 106 | Temp 99.9°F | Resp 18

## 2021-05-27 DIAGNOSIS — C180 Malignant neoplasm of cecum: Secondary | ICD-10-CM

## 2021-05-27 DIAGNOSIS — Z5111 Encounter for antineoplastic chemotherapy: Secondary | ICD-10-CM | POA: Diagnosis not present

## 2021-05-27 MED ORDER — HEPARIN SOD (PORK) LOCK FLUSH 100 UNIT/ML IV SOLN
250.0000 [IU] | Freq: Once | INTRAVENOUS | Status: AC
  Administered 2021-05-27: 250 [IU]

## 2021-05-27 MED ORDER — SODIUM CHLORIDE 0.9% FLUSH
10.0000 mL | Freq: Once | INTRAVENOUS | Status: AC
  Administered 2021-05-27: 10 mL

## 2021-05-27 MED ORDER — HEPARIN SOD (PORK) LOCK FLUSH 100 UNIT/ML IV SOLN: 250.0000 [IU] | Freq: Once | INTRAVENOUS | Status: DC

## 2021-05-27 MED ORDER — SODIUM CHLORIDE 0.9% FLUSH: 10.0000 mL | Freq: Once | INTRAVENOUS | Status: DC

## 2021-05-27 NOTE — Patient Instructions (Signed)
PICC Home Care Guide A peripherally inserted central catheter (PICC) is a form of IV access that allows medicines and IV fluids to be quickly put into the blood and spread throughout the body. The PICC is a long, thin, flexible tube (catheter) that is put into a vein in a person's arm or leg. The catheter ends in a large vein just outside the heart called the superior vena cava (SVC). After the PICC is put in, a chest X-ray may be done to make sure that it is in the right place. A PICC may be placed for different reasons, such as: To give medicines and liquid nutrition. To give IV fluids and blood products. To take blood samples often. If there is trouble placing a peripheral intravenous (PIV) catheter. If cared for properly, a PICC can remain in place for many months. Having a PICC can allow you to go home from the hospital sooner and continue treatment at home. Medicines and PICC care can be managed at home by a family member, caregiver, or home health care team. What are the risks? Generally, having a PICC is safe. However, problems may occur, including: A blood clot (thrombus) forming in or at the end of the PICC. A blood clot forming in a vein (deep vein thrombosis) or traveling to the lung (pulmonary embolism). Inflammation of the vein (phlebitis) in which the PICC is placed. Infection at the insertion site or in the blood. Blood infections from central lines, like PICCs, can be serious and often require a hospital stay. PICC malposition, or PICC movement or poor placement. A break or cut in the PICC. Do not use scissors near the PICC. Nerve or tendon irritation or injury during PICC insertion. How to care for your PICC Please follow the specific guidelines provided by your health care provider. Preventing infection You and any caregivers should wash your hands often with soap and water for at least 20 seconds. Wash hands: Before touching the PICC or the infusion device. Before changing a  bandage (dressing). Do not change the dressing unless you have been taught to do so and have shown you are able to change it safely. Flush the PICC as told. Tell your health care provider right away if the PICC is hard to flush or does not flush. Do not use force to flush the PICC. Use clean and germ-free (sterile) supplies only. Keep the supplies in a dry place. Do not reuse needles, syringes, or any other supplies. Reusing supplies can lead to infection. Keep the PICC dressing dry and secure it with tape if the edges stop sticking to your skin. Check your PICC insertion site every day for signs of infection. Check for: Redness, swelling, or pain. Fluid or blood. Warmth. Pus or a bad smell. Preventing other problems Do not use a syringe that is less than 10 mL to flush the PICC. Do not have your blood pressure checked on the arm in which the PICC is placed. Do not ever pull or tug on the PICC. Keep it secured to your arm with tape or a stretch wrap when not in use. Do not take the PICC out yourself. Only a trained health care provider should remove the PICC. Keep pets and children away from your PICC. How to care for your PICC dressing Keep your PICC dressing clean and dry to prevent infection. Do not take baths, swim, or use a hot tub until your health care provider approves. Ask your health care provider if you can take   showers. You may only be allowed to take sponge baths. When you are allowed to shower: Ask your health care provider to teach you how to wrap the PICC. Cover the PICC with clear plastic wrap and tape to keep it dry while showering. Follow instructions from your health care provider about how to take care of your insertion site and dressing. Make sure you: Wash your hands with soap and water for at least 20 seconds before and after you change your dressing. If soap and water are not available, use hand sanitizer. Change your dressing only if taught to do so by your health care  provider. Your PICC dressing needs to be changed if it becomes loose or wet. Leave stitches (sutures), skin glue, or adhesive strips in place. These skin closures may need to stay in place for 2 weeks or longer. If adhesive strip edges start to loosen and curl up, you may trim the loose edges. Do not remove adhesive strips completely unless your health care provider tells you to do that. Follow these instructions at home: Disposal of supplies Throw away any syringes in a disposal container that is meant for sharp items (sharps container). You can buy a sharps container from a pharmacy, or you can make one by using an empty, hard plastic bottle with a lid. Place any used dressings or infusion bags into a plastic bag. Throw that bag in the trash. General instructions  Always carry your PICC identification card or wear a medical alert bracelet. Keep the tube clamped at all times, unless it is being used. Always carry a smooth-edge clamp with you to clamp the PICC if it breaks. Do not use scissors or sharp objects near the tube. You may bend your arm and move it freely. If your PICC is near or at the bend of your elbow, avoid activity with repeated motion at the elbow. Avoid lifting heavy objects as told by your health care provider. Keep all follow-up visits. This is important. You will need to have your PICC dressing changed at least once a week. Contact a health care provider if: You have pain in your arm, ear, face, or teeth. You have a fever or chills. You have redness, swelling, or pain around the insertion site. You have fluid or blood coming from the insertion site. Your insertion site feels warm to the touch. You have pus or a bad smell coming from the insertion site. Your skin feels hard and raised around the insertion site. Your PICC dressing has gotten wet or is coming off and you have not been taught how to change it. Get help right away if: You have problems with your PICC, such as  your PICC: Was tugged or pulled and has partially come out. Do not  push the PICC back in. Cannot be flushed, is hard to flush, or leaks around the insertion site when it is flushed. Makes a flushing sound when it is flushed. Appears to have a hole or tear. Is accidentally pulled all the way out. If this happens, cover the insertion site with a gauze dressing. Do not throw the PICC away. Your health care provider will need to check it to be sure the entire catheter came out. You feel your heart racing or skipping beats, or you have chest pain. You have shortness of breath or trouble breathing. You have swelling, redness, warmth, or pain in the arm in which the PICC is placed. You have a red streak going up your arm that   starts under the PICC dressing. These symptoms may be an emergency. Get help right away. Call 911. Do not wait to see if the symptoms will go away. Do not drive yourself to the hospital. Summary A peripherally inserted central catheter (PICC) is a long, thin, flexible tube (catheter) that is put into a vein in the arm or leg. If cared for properly, a PICC can remain in place for many months. Having a PICC can allow you to go home from the hospital sooner and continue treatment at home. The PICC is inserted using a germ-free (sterile) technique by a specially trained health care provider. Only a trained health care provider should remove it. Do not have your blood pressure checked on the arm in which your PICC is placed. Always keep your PICC identification card with you. This information is not intended to replace advice given to you by your health care provider. Make sure you discuss any questions you have with your health care provider. Document Revised: 10/27/2020 Document Reviewed: 10/27/2020 Elsevier Patient Education  2022 [employer].  

## 2021-05-30 ENCOUNTER — Inpatient Hospital Stay: Payer: PRIVATE HEALTH INSURANCE

## 2021-05-30 ENCOUNTER — Encounter: Payer: Self-pay | Admitting: Family

## 2021-05-30 ENCOUNTER — Other Ambulatory Visit: Payer: Self-pay

## 2021-05-30 ENCOUNTER — Inpatient Hospital Stay (HOSPITAL_BASED_OUTPATIENT_CLINIC_OR_DEPARTMENT_OTHER): Payer: PRIVATE HEALTH INSURANCE | Admitting: Family

## 2021-05-30 VITALS — BP 125/87 | Temp 98.4°F | Resp 18

## 2021-05-30 VITALS — BP 127/85 | HR 90 | Temp 98.4°F | Resp 18 | Ht 62.99 in | Wt 263.0 lb

## 2021-05-30 DIAGNOSIS — C180 Malignant neoplasm of cecum: Secondary | ICD-10-CM

## 2021-05-30 DIAGNOSIS — D509 Iron deficiency anemia, unspecified: Secondary | ICD-10-CM | POA: Diagnosis not present

## 2021-05-30 DIAGNOSIS — I2690 Septic pulmonary embolism without acute cor pulmonale: Secondary | ICD-10-CM | POA: Diagnosis not present

## 2021-05-30 DIAGNOSIS — E876 Hypokalemia: Secondary | ICD-10-CM | POA: Diagnosis not present

## 2021-05-30 DIAGNOSIS — Z452 Encounter for adjustment and management of vascular access device: Secondary | ICD-10-CM

## 2021-05-30 DIAGNOSIS — Z5111 Encounter for antineoplastic chemotherapy: Secondary | ICD-10-CM | POA: Diagnosis not present

## 2021-05-30 LAB — CBC WITH DIFFERENTIAL (CANCER CENTER ONLY)
Abs Immature Granulocytes: 0.01 10*3/uL (ref 0.00–0.07)
Basophils Absolute: 0 10*3/uL (ref 0.0–0.1)
Basophils Relative: 1 %
Eosinophils Absolute: 0 10*3/uL (ref 0.0–0.5)
Eosinophils Relative: 1 %
HCT: 31.4 % — ABNORMAL LOW (ref 36.0–46.0)
Hemoglobin: 10.5 g/dL — ABNORMAL LOW (ref 12.0–15.0)
Immature Granulocytes: 0 %
Lymphocytes Relative: 45 %
Lymphs Abs: 1.4 10*3/uL (ref 0.7–4.0)
MCH: 30.6 pg (ref 26.0–34.0)
MCHC: 33.4 g/dL (ref 30.0–36.0)
MCV: 91.5 fL (ref 80.0–100.0)
Monocytes Absolute: 0.5 10*3/uL (ref 0.1–1.0)
Monocytes Relative: 15 %
Neutro Abs: 1.2 10*3/uL — ABNORMAL LOW (ref 1.7–7.7)
Neutrophils Relative %: 38 %
Platelet Count: 245 10*3/uL (ref 150–400)
RBC: 3.43 MIL/uL — ABNORMAL LOW (ref 3.87–5.11)
RDW: 15.3 % (ref 11.5–15.5)
WBC Count: 3.1 10*3/uL — ABNORMAL LOW (ref 4.0–10.5)
nRBC: 0 % (ref 0.0–0.2)

## 2021-05-30 LAB — CMP (CANCER CENTER ONLY)
ALT: 24 U/L (ref 0–44)
AST: 19 U/L (ref 15–41)
Albumin: 3.4 g/dL — ABNORMAL LOW (ref 3.5–5.0)
Alkaline Phosphatase: 83 U/L (ref 38–126)
Anion gap: 7 (ref 5–15)
BUN: 8 mg/dL (ref 6–20)
CO2: 29 mmol/L (ref 22–32)
Calcium: 8.8 mg/dL — ABNORMAL LOW (ref 8.9–10.3)
Chloride: 103 mmol/L (ref 98–111)
Creatinine: 0.7 mg/dL (ref 0.44–1.00)
GFR, Estimated: >60 mL/min (ref >60)
Glucose, Bld: 136 mg/dL — ABNORMAL HIGH (ref 70–99)
Potassium: 3 mmol/L — ABNORMAL LOW (ref 3.5–5.1)
Sodium: 139 mmol/L (ref 135–145)
Total Bilirubin: 0.4 mg/dL (ref 0.3–1.2)
Total Protein: 5.9 g/dL — ABNORMAL LOW (ref 6.5–8.1)

## 2021-05-30 LAB — IRON AND IRON BINDING CAPACITY (CC-WL,HP ONLY)
Iron: 51 ug/dL (ref 28–170)
Saturation Ratios: 19 % (ref 10.4–31.8)
TIBC: 265 ug/dL (ref 250–450)
UIBC: 214 ug/dL (ref 148–442)

## 2021-05-30 LAB — CEA (IN HOUSE-CHCC): CEA (CHCC-In House): 5.9 ng/mL — ABNORMAL HIGH (ref 0.00–5.00)

## 2021-05-30 LAB — FERRITIN: Ferritin: 346 ng/mL — ABNORMAL HIGH (ref 11–307)

## 2021-05-30 MED ORDER — POTASSIUM CHLORIDE CRYS ER 20 MEQ PO TBCR
40.0000 meq | EXTENDED_RELEASE_TABLET | Freq: Once | ORAL | Status: AC
  Administered 2021-05-30: 40 meq via ORAL
  Filled 2021-05-30: qty 2

## 2021-05-30 MED ORDER — SODIUM CHLORIDE 0.9 % IV SOLN
10.0000 mg | Freq: Once | INTRAVENOUS | Status: AC
  Administered 2021-05-30: 10 mg via INTRAVENOUS
  Filled 2021-05-30: qty 10

## 2021-05-30 MED ORDER — PALONOSETRON HCL INJECTION 0.25 MG/5ML
0.2500 mg | Freq: Once | INTRAVENOUS | Status: AC
  Administered 2021-05-30: 0.25 mg via INTRAVENOUS
  Filled 2021-05-30: qty 5

## 2021-05-30 MED ORDER — SODIUM CHLORIDE 0.9 % IV SOLN
165.0000 mg/m2 | Freq: Once | INTRAVENOUS | Status: AC
  Administered 2021-05-30: 400 mg via INTRAVENOUS
  Filled 2021-05-30: qty 15

## 2021-05-30 MED ORDER — ATROPINE SULFATE 1 MG/ML IV SOLN
0.5000 mg | Freq: Once | INTRAVENOUS | Status: AC | PRN
  Administered 2021-05-30: 0.5 mg via INTRAVENOUS
  Filled 2021-05-30: qty 1

## 2021-05-30 MED ORDER — SODIUM CHLORIDE 0.9 % IV SOLN: INTRAVENOUS | Status: DC

## 2021-05-30 MED ORDER — SODIUM CHLORIDE 0.9 % IV SOLN: Freq: Once | INTRAVENOUS | Status: AC

## 2021-05-30 MED ORDER — SODIUM CHLORIDE 0.9 % IV SOLN
2400.0000 mg/m2 | INTRAVENOUS | Status: DC
  Administered 2021-05-30: 5750 mg via INTRAVENOUS
  Filled 2021-05-30: qty 115

## 2021-05-30 MED ORDER — SODIUM CHLORIDE 0.9 % IV SOLN
400.0000 mg/m2 | Freq: Once | INTRAVENOUS | Status: AC
  Administered 2021-05-30: 956 mg via INTRAVENOUS
  Filled 2021-05-30: qty 47.8

## 2021-05-30 MED ORDER — SODIUM CHLORIDE 0.9% FLUSH
10.0000 mL | Freq: Once | INTRAVENOUS | Status: AC
  Administered 2021-05-30: 10 mL via INTRAVENOUS

## 2021-05-30 MED ORDER — SODIUM CHLORIDE 0.9 % IV SOLN
150.0000 mg | Freq: Once | INTRAVENOUS | Status: AC
  Administered 2021-05-30: 150 mg via INTRAVENOUS
  Filled 2021-05-30: qty 150

## 2021-05-30 NOTE — Progress Notes (Signed)
Hematology and Oncology Follow Up Visit  GARY BULTMAN [id number] [birth year] 56 y.o. 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 daily-start on 01/22/2021   Interim History:  Ms. Mello is here today for follow-up and treatment. She is doing quite well and has no complaints at this time.  Her energy and stamina have been good overall.  No fever, chills, n/v, cough, rash, dizziness, SOB, chest pain, palpitations, abdominal pain or changes in bowel or bladder habits.  No blood loss noted. No bruising or petechiae.  The neuropathy in her hands and feet is unchanged from baseline. She stopped the Oxaliplatin after cycle 10 due to the neuropathy.  Pedal pulses are 2+.  No swelling noted in her extremities. No falls or syncope to report.  She has been doing her best to eat well and is staying hydrated throughout the day. Her weight is stable at 263 lbs.   ECOG Performance Status: 1 - Symptomatic but completely ambulatory  Medications:  Allergies as of 05/30/2021   No Known Allergies      Medication List        Accurate as of May 30, 2021  9:11 AM. If you have any questions, ask your nurse or doctor.          dexamethasone 6 MG tablet Commonly known as: DECADRON Take 1 tablet (6 mg total) by mouth See admin instructions. Take 6 mg by mouth daily for 3 days, starting the day after chemotherapy. Take with food.   loperamide 2 MG tablet Commonly known as: Imodium A-D Take 2 at onset of diarrhea, then 1 every 2hrs until 12hr without a BM. May take 2 tab every 4hrs at bedtime. If diarrhea recurs repeat.   ondansetron 8 MG tablet Commonly known as: Zofran Take 1 tablet (8 mg total) by mouth 2 (two) times daily  as needed. Start on day 3 after chemotherapy.   prochlorperazine 10 MG tablet Commonly known as: COMPAZINE Take 1 tablet (10 mg total) by mouth every 6 (six) hours as needed (Nausea or vomiting).   rivaroxaban 20 MG Tabs tablet Commonly known as: XARELTO Take 1 tablet (20 mg total) by mouth daily with supper.        Allergies: No Known Allergies  Past Medical History, Surgical history, Social history, and Family History were reviewed and updated.  Review of Systems: All other 10 point review of systems is negative.   Physical Exam:  height is 5' 2.99" (1.6 m) and weight is 263 lb (119.3 kg). Her oral temperature is 98.4 F (36.9 C). Her blood pressure is 127/85 and her pulse is 90. Her respiration is 18 and oxygen saturation is 100%.   Wt Readings from Last 3 Encounters:  05/30/21 263 lb (119.3 kg)  05/16/21 264 lb (119.7 kg)  05/13/21 267 lb 6.4 oz (121.3 kg)    Ocular: Sclerae unicteric, pupils equal, round and reactive to light Ear-nose-throat: Oropharynx clear, dentition fair Lymphatic: No cervical or supraclavicular adenopathy Lungs no rales or rhonchi, good excursion bilaterally Heart regular rate and rhythm, no murmur appreciated Abd soft, nontender, positive bowel sounds MSK no focal spinal tenderness, no joint edema Neuro: non-focal, well-oriented, appropriate affect Breasts: Deferred   Lab Results  Component Value Date   WBC 3.1 (L) 05/30/2021   HGB 10.5 (L) 05/30/2021   HCT 31.4 (L) 05/30/2021   MCV 91.5 05/30/2021   PLT 245 05/30/2021   Lab Results  Component Value Date   FERRITIN 676 (H) 05/16/2021   IRON 61 05/16/2021   TIBC 274 05/16/2021   UIBC 213 05/16/2021   IRONPCTSAT 22 05/16/2021   Lab Results  Component Value Date   RETICCTPCT 3.0 01/04/2021   RBC 3.43 (L) 05/30/2021   No results found for: KPAFRELGTCHN, LAMBDASER, KAPLAMBRATIO No results found for: IGGSERUM, IGA, IGMSERUM No results found for: Odetta Pink, SPEI   Chemistry      Component Value Date/Time   NA 139 05/30/2021 0838   K 3.0 (L) 05/30/2021 0838   CL 103 05/30/2021 0838   CO2 29 05/30/2021 0838   BUN 8 05/30/2021 0838   CREATININE 0.70 05/30/2021 0838      Component Value Date/Time   CALCIUM 8.8 (L) 05/30/2021 0838   ALKPHOS 83 05/30/2021 0838   AST 19 05/30/2021 0838   ALT 24 05/30/2021 0838   BILITOT 0.4 05/30/2021 0838       Impression and Plan: Ms. Louks is a very pleasant 56 yo African American female with metastatic adenocarcinoma of the cecum. 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  Her CEA last visit was up slightly at 6.02. CEA result today is pending.  We will proceed with her treatment today per MD with ANC 1.2.  We will give her 40 meq of KDUR now and again before she leaves.  Iron studies are pending.  Follow-up in 2 weeks.   Lottie Dawson, NP 2/6/20239:11 AM

## 2021-05-30 NOTE — Progress Notes (Signed)
Patient has neuropathy, deleting oxaliplatin from careplan per Dr. Antonieta Pert instructions. He will continue to give Neulasta on day 3.

## 2021-05-30 NOTE — Patient Instructions (Addendum)
[facility]  Discharge Instructions: Thank you for choosing [facility] to provide your oncology and hematology care.   If you have a lab appointment with the [facility], please go directly to the [facility] and check in at the registration area.  Wear comfortable clothing and clothing appropriate for easy access to any Portacath or PICC line.   We strive to give you quality time with your provider. You may need to reschedule your appointment if you arrive late (15 or more minutes).  Arriving late affects you and other patients whose appointments are after yours.  Also, if you miss three or more appointments without notifying the office, you may be dismissed from the clinic at the providers discretion.      For prescription refill requests, have your pharmacy contact our office and allow 72 hours for refills to be completed.    Today you received the following chemotherapy and/or immunotherapy agents Irinotecan, Leucovorin and 5FU    To help prevent nausea and vomiting after your treatment, we encourage you to take your nausea medication as directed.  BELOW ARE SYMPTOMS THAT SHOULD BE REPORTED IMMEDIATELY: *FEVER GREATER THAN 100.4 F (38 C) OR HIGHER *CHILLS OR SWEATING *NAUSEA AND VOMITING THAT IS NOT CONTROLLED WITH YOUR NAUSEA MEDICATION *UNUSUAL SHORTNESS OF BREATH *UNUSUAL BRUISING OR BLEEDING *URINARY PROBLEMS (pain or burning when urinating, or frequent urination) *BOWEL PROBLEMS (unusual diarrhea, constipation, pain near the anus) TENDERNESS IN MOUTH AND THROAT WITH OR WITHOUT PRESENCE OF ULCERS (sore throat, sores in mouth, or a toothache) UNUSUAL RASH, SWELLING OR PAIN  UNUSUAL VAGINAL DISCHARGE OR ITCHING   Items with * indicate a potential emergency and should be followed up as soon as possible or go to the Emergency Department if any problems should occur.  Please show the CHEMOTHERAPY ALERT CARD or IMMUNOTHERAPY ALERT CARD  at check-in to the Emergency Department and triage nurse. Should you have questions after your visit or need to cancel or reschedule your appointment, please contact [facility]  [phone] and follow the prompts.  Office hours are 8:00 a.m. to 4:30 p.m. Monday - Friday. Please note that voicemails left after 4:00 p.m. may not be returned until the following business day.  We are closed weekends and major holidays. You have access to a nurse at all times for urgent questions. Please call the main number to the clinic [phone] and follow the prompts.  For any non-urgent questions, you may also contact your provider using MyChart. We now offer e-Visits for anyone 61 and older to request care online for non-urgent symptoms. For details visit [email or web address].   Also download the MyChart app! Go to the app store, search "MyChart", open the app, select [facility], and log in with your MyChart username and password.  Due to Covid, a mask is required upon entering the hospital/clinic. If you do not have a mask, one will be given to you upon arrival. For doctor visits, patients may have 1 support person aged 10 or older with them. For treatment visits, patients cannot have anyone with them due to current Covid guidelines and our immunocompromised population.

## 2021-05-30 NOTE — Progress Notes (Signed)
Reviewed pt labs with Lottie Dawson NP and pt ok to treat with ANC 1.2

## 2021-06-01 ENCOUNTER — Other Ambulatory Visit: Payer: Self-pay

## 2021-06-01 ENCOUNTER — Inpatient Hospital Stay: Payer: PRIVATE HEALTH INSURANCE

## 2021-06-01 ENCOUNTER — Telehealth: Payer: Self-pay | Admitting: *Deleted

## 2021-06-01 VITALS — BP 125/87 | HR 102 | Temp 98.2°F | Resp 18

## 2021-06-01 DIAGNOSIS — Z5111 Encounter for antineoplastic chemotherapy: Secondary | ICD-10-CM | POA: Diagnosis not present

## 2021-06-01 DIAGNOSIS — C180 Malignant neoplasm of cecum: Secondary | ICD-10-CM

## 2021-06-01 MED ORDER — PEGFILGRASTIM INJECTION 6 MG/0.6ML ~~LOC~~
6.0000 mg | PREFILLED_SYRINGE | Freq: Once | SUBCUTANEOUS | Status: AC
  Administered 2021-06-01: 6 mg via SUBCUTANEOUS
  Filled 2021-06-01: qty 0.6

## 2021-06-01 MED ORDER — SODIUM CHLORIDE 0.9% FLUSH
10.0000 mL | INTRAVENOUS | Status: DC | PRN
  Administered 2021-06-01: 10 mL

## 2021-06-01 MED ORDER — HEPARIN SOD (PORK) LOCK FLUSH 100 UNIT/ML IV SOLN
250.0000 [IU] | Freq: Once | INTRAVENOUS | Status: AC | PRN
  Administered 2021-06-01: 250 [IU]

## 2021-06-01 NOTE — Patient Instructions (Signed)

## 2021-06-01 NOTE — Telephone Encounter (Signed)
Per 05/30/21 los - gave upcoming appointments - print calendar

## 2021-06-03 ENCOUNTER — Inpatient Hospital Stay: Payer: PRIVATE HEALTH INSURANCE

## 2021-06-03 ENCOUNTER — Other Ambulatory Visit: Payer: Self-pay

## 2021-06-03 VITALS — BP 112/77 | HR 99 | Temp 98.2°F | Resp 18

## 2021-06-03 DIAGNOSIS — Z5111 Encounter for antineoplastic chemotherapy: Secondary | ICD-10-CM | POA: Diagnosis not present

## 2021-06-03 DIAGNOSIS — C180 Malignant neoplasm of cecum: Secondary | ICD-10-CM

## 2021-06-03 MED ORDER — HEPARIN SOD (PORK) LOCK FLUSH 100 UNIT/ML IV SOLN
500.0000 [IU] | Freq: Once | INTRAVENOUS | Status: AC
  Administered 2021-06-03: 500 [IU] via INTRAVENOUS

## 2021-06-03 MED ORDER — SODIUM CHLORIDE 0.9% FLUSH
10.0000 mL | INTRAVENOUS | Status: DC | PRN
  Administered 2021-06-03: 10 mL via INTRAVENOUS

## 2021-06-03 NOTE — Patient Instructions (Signed)
PICC Home Care Guide A peripherally inserted central catheter (PICC) is a form of IV access that allows medicines and IV fluids to be quickly put into the blood and spread throughout the body. The PICC is a long, thin, flexible tube (catheter) that is put into a vein in a person's arm or leg. The catheter ends in a large vein just outside the heart called the superior vena cava (SVC). After the PICC is put in, a chest X-ray may be done to make sure that it is in the right place. A PICC may be placed for different reasons, such as: To give medicines and liquid nutrition. To give IV fluids and blood products. To take blood samples often. If there is trouble placing a peripheral intravenous (PIV) catheter. If cared for properly, a PICC can remain in place for many months. Having a PICC can allow you to go home from the hospital sooner and continue treatment at home. Medicines and PICC care can be managed at home by a family member, caregiver, or home health care team. What are the risks? Generally, having a PICC is safe. However, problems may occur, including: A blood clot (thrombus) forming in or at the end of the PICC. A blood clot forming in a vein (deep vein thrombosis) or traveling to the lung (pulmonary embolism). Inflammation of the vein (phlebitis) in which the PICC is placed. Infection at the insertion site or in the blood. Blood infections from central lines, like PICCs, can be serious and often require a hospital stay. PICC malposition, or PICC movement or poor placement. A break or cut in the PICC. Do not use scissors near the PICC. Nerve or tendon irritation or injury during PICC insertion. How to care for your PICC Please follow the specific guidelines provided by your health care provider. Preventing infection You and any caregivers should wash your hands often with soap and water for at least 20 seconds. Wash hands: Before touching the PICC or the infusion device. Before changing a  bandage (dressing). Do not change the dressing unless you have been taught to do so and have shown you are able to change it safely. Flush the PICC as told. Tell your health care provider right away if the PICC is hard to flush or does not flush. Do not use force to flush the PICC. Use clean and germ-free (sterile) supplies only. Keep the supplies in a dry place. Do not reuse needles, syringes, or any other supplies. Reusing supplies can lead to infection. Keep the PICC dressing dry and secure it with tape if the edges stop sticking to your skin. Check your PICC insertion site every day for signs of infection. Check for: Redness, swelling, or pain. Fluid or blood. Warmth. Pus or a bad smell. Preventing other problems Do not use a syringe that is less than 10 mL to flush the PICC. Do not have your blood pressure checked on the arm in which the PICC is placed. Do not ever pull or tug on the PICC. Keep it secured to your arm with tape or a stretch wrap when not in use. Do not take the PICC out yourself. Only a trained health care provider should remove the PICC. Keep pets and children away from your PICC. How to care for your PICC dressing Keep your PICC dressing clean and dry to prevent infection. Do not take baths, swim, or use a hot tub until your health care provider approves. Ask your health care provider if you can take   showers. You may only be allowed to take sponge baths. When you are allowed to shower: Ask your health care provider to teach you how to wrap the PICC. Cover the PICC with clear plastic wrap and tape to keep it dry while showering. Follow instructions from your health care provider about how to take care of your insertion site and dressing. Make sure you: Wash your hands with soap and water for at least 20 seconds before and after you change your dressing. If soap and water are not available, use hand sanitizer. Change your dressing only if taught to do so by your health care  provider. Your PICC dressing needs to be changed if it becomes loose or wet. Leave stitches (sutures), skin glue, or adhesive strips in place. These skin closures may need to stay in place for 2 weeks or longer. If adhesive strip edges start to loosen and curl up, you may trim the loose edges. Do not remove adhesive strips completely unless your health care provider tells you to do that. Follow these instructions at home: Disposal of supplies Throw away any syringes in a disposal container that is meant for sharp items (sharps container). You can buy a sharps container from a pharmacy, or you can make one by using an empty, hard plastic bottle with a lid. Place any used dressings or infusion bags into a plastic bag. Throw that bag in the trash. General instructions  Always carry your PICC identification card or wear a medical alert bracelet. Keep the tube clamped at all times, unless it is being used. Always carry a smooth-edge clamp with you to clamp the PICC if it breaks. Do not use scissors or sharp objects near the tube. You may bend your arm and move it freely. If your PICC is near or at the bend of your elbow, avoid activity with repeated motion at the elbow. Avoid lifting heavy objects as told by your health care provider. Keep all follow-up visits. This is important. You will need to have your PICC dressing changed at least once a week. Contact a health care provider if: You have pain in your arm, ear, face, or teeth. You have a fever or chills. You have redness, swelling, or pain around the insertion site. You have fluid or blood coming from the insertion site. Your insertion site feels warm to the touch. You have pus or a bad smell coming from the insertion site. Your skin feels hard and raised around the insertion site. Your PICC dressing has gotten wet or is coming off and you have not been taught how to change it. Get help right away if: You have problems with your PICC, such as  your PICC: Was tugged or pulled and has partially come out. Do not  push the PICC back in. Cannot be flushed, is hard to flush, or leaks around the insertion site when it is flushed. Makes a flushing sound when it is flushed. Appears to have a hole or tear. Is accidentally pulled all the way out. If this happens, cover the insertion site with a gauze dressing. Do not throw the PICC away. Your health care provider will need to check it to be sure the entire catheter came out. You feel your heart racing or skipping beats, or you have chest pain. You have shortness of breath or trouble breathing. You have swelling, redness, warmth, or pain in the arm in which the PICC is placed. You have a red streak going up your arm that   starts under the PICC dressing. These symptoms may be an emergency. Get help right away. Call 911. Do not wait to see if the symptoms will go away. Do not drive yourself to the hospital. Summary A peripherally inserted central catheter (PICC) is a long, thin, flexible tube (catheter) that is put into a vein in the arm or leg. If cared for properly, a PICC can remain in place for many months. Having a PICC can allow you to go home from the hospital sooner and continue treatment at home. The PICC is inserted using a germ-free (sterile) technique by a specially trained health care provider. Only a trained health care provider should remove it. Do not have your blood pressure checked on the arm in which your PICC is placed. Always keep your PICC identification card with you. This information is not intended to replace advice given to you by your health care provider. Make sure you discuss any questions you have with your health care provider. Document Revised: 10/27/2020 Document Reviewed: 10/27/2020 Elsevier Patient Education  2022 [employer].  

## 2021-06-06 ENCOUNTER — Other Ambulatory Visit: Payer: Self-pay

## 2021-06-06 ENCOUNTER — Inpatient Hospital Stay: Payer: PRIVATE HEALTH INSURANCE

## 2021-06-06 VITALS — BP 129/94 | HR 100 | Temp 98.6°F | Resp 18

## 2021-06-06 DIAGNOSIS — C180 Malignant neoplasm of cecum: Secondary | ICD-10-CM

## 2021-06-06 DIAGNOSIS — Z5111 Encounter for antineoplastic chemotherapy: Secondary | ICD-10-CM | POA: Diagnosis not present

## 2021-06-06 MED ORDER — SODIUM CHLORIDE 0.9% FLUSH
10.0000 mL | Freq: Once | INTRAVENOUS | Status: AC
  Administered 2021-06-06: 10 mL via INTRAVENOUS

## 2021-06-06 MED ORDER — HEPARIN SOD (PORK) LOCK FLUSH 100 UNIT/ML IV SOLN
500.0000 [IU] | Freq: Once | INTRAVENOUS | Status: AC
  Administered 2021-06-06: 500 [IU] via INTRAVENOUS

## 2021-06-06 NOTE — Patient Instructions (Signed)

## 2021-06-08 ENCOUNTER — Other Ambulatory Visit: Payer: Self-pay

## 2021-06-08 ENCOUNTER — Inpatient Hospital Stay: Payer: PRIVATE HEALTH INSURANCE

## 2021-06-08 VITALS — BP 130/88 | HR 98 | Temp 98.1°F | Resp 18

## 2021-06-08 DIAGNOSIS — Z5111 Encounter for antineoplastic chemotherapy: Secondary | ICD-10-CM | POA: Diagnosis not present

## 2021-06-08 DIAGNOSIS — C180 Malignant neoplasm of cecum: Secondary | ICD-10-CM

## 2021-06-08 MED ORDER — HEPARIN SOD (PORK) LOCK FLUSH 100 UNIT/ML IV SOLN
250.0000 [IU] | Freq: Once | INTRAVENOUS | Status: AC
  Administered 2021-06-08: 250 [IU]

## 2021-06-08 MED ORDER — SODIUM CHLORIDE 0.9% FLUSH
10.0000 mL | Freq: Once | INTRAVENOUS | Status: AC
  Administered 2021-06-08: 10 mL

## 2021-06-08 NOTE — Patient Instructions (Signed)
PICC Home Care Guide A peripherally inserted central catheter (PICC) is a form of IV access that allows medicines and IV fluids to be quickly put into the blood and spread throughout the body. The PICC is a long, thin, flexible tube (catheter) that is put into a vein in a person's arm or leg. The catheter ends in a large vein just outside the heart called the superior vena cava (SVC). After the PICC is put in, a chest X-ray may be done to make sure that it is in the right place. A PICC may be placed for different reasons, such as: To give medicines and liquid nutrition. To give IV fluids and blood products. To take blood samples often. If there is trouble placing a peripheral intravenous (PIV) catheter. If cared for properly, a PICC can remain in place for many months. Having a PICC can allow you to go home from the hospital sooner and continue treatment at home. Medicines and PICC care can be managed at home by a family member, caregiver, or home health care team. What are the risks? Generally, having a PICC is safe. However, problems may occur, including: A blood clot (thrombus) forming in or at the end of the PICC. A blood clot forming in a vein (deep vein thrombosis) or traveling to the lung (pulmonary embolism). Inflammation of the vein (phlebitis) in which the PICC is placed. Infection at the insertion site or in the blood. Blood infections from central lines, like PICCs, can be serious and often require a hospital stay. PICC malposition, or PICC movement or poor placement. A break or cut in the PICC. Do not use scissors near the PICC. Nerve or tendon irritation or injury during PICC insertion. How to care for your PICC Please follow the specific guidelines provided by your health care provider. Preventing infection You and any caregivers should wash your hands often with soap and water for at least 20 seconds. Wash hands: Before touching the PICC or the infusion device. Before changing a  bandage (dressing). Do not change the dressing unless you have been taught to do so and have shown you are able to change it safely. Flush the PICC as told. Tell your health care provider right away if the PICC is hard to flush or does not flush. Do not use force to flush the PICC. Use clean and germ-free (sterile) supplies only. Keep the supplies in a dry place. Do not reuse needles, syringes, or any other supplies. Reusing supplies can lead to infection. Keep the PICC dressing dry and secure it with tape if the edges stop sticking to your skin. Check your PICC insertion site every day for signs of infection. Check for: Redness, swelling, or pain. Fluid or blood. Warmth. Pus or a bad smell. Preventing other problems Do not use a syringe that is less than 10 mL to flush the PICC. Do not have your blood pressure checked on the arm in which the PICC is placed. Do not ever pull or tug on the PICC. Keep it secured to your arm with tape or a stretch wrap when not in use. Do not take the PICC out yourself. Only a trained health care provider should remove the PICC. Keep pets and children away from your PICC. How to care for your PICC dressing Keep your PICC dressing clean and dry to prevent infection. Do not take baths, swim, or use a hot tub until your health care provider approves. Ask your health care provider if you can take   showers. You may only be allowed to take sponge baths. When you are allowed to shower: Ask your health care provider to teach you how to wrap the PICC. Cover the PICC with clear plastic wrap and tape to keep it dry while showering. Follow instructions from your health care provider about how to take care of your insertion site and dressing. Make sure you: Wash your hands with soap and water for at least 20 seconds before and after you change your dressing. If soap and water are not available, use hand sanitizer. Change your dressing only if taught to do so by your health care  provider. Your PICC dressing needs to be changed if it becomes loose or wet. Leave stitches (sutures), skin glue, or adhesive strips in place. These skin closures may need to stay in place for 2 weeks or longer. If adhesive strip edges start to loosen and curl up, you may trim the loose edges. Do not remove adhesive strips completely unless your health care provider tells you to do that. Follow these instructions at home: Disposal of supplies Throw away any syringes in a disposal container that is meant for sharp items (sharps container). You can buy a sharps container from a pharmacy, or you can make one by using an empty, hard plastic bottle with a lid. Place any used dressings or infusion bags into a plastic bag. Throw that bag in the trash. General instructions  Always carry your PICC identification card or wear a medical alert bracelet. Keep the tube clamped at all times, unless it is being used. Always carry a smooth-edge clamp with you to clamp the PICC if it breaks. Do not use scissors or sharp objects near the tube. You may bend your arm and move it freely. If your PICC is near or at the bend of your elbow, avoid activity with repeated motion at the elbow. Avoid lifting heavy objects as told by your health care provider. Keep all follow-up visits. This is important. You will need to have your PICC dressing changed at least once a week. Contact a health care provider if: You have pain in your arm, ear, face, or teeth. You have a fever or chills. You have redness, swelling, or pain around the insertion site. You have fluid or blood coming from the insertion site. Your insertion site feels warm to the touch. You have pus or a bad smell coming from the insertion site. Your skin feels hard and raised around the insertion site. Your PICC dressing has gotten wet or is coming off and you have not been taught how to change it. Get help right away if: You have problems with your PICC, such as  your PICC: Was tugged or pulled and has partially come out. Do not  push the PICC back in. Cannot be flushed, is hard to flush, or leaks around the insertion site when it is flushed. Makes a flushing sound when it is flushed. Appears to have a hole or tear. Is accidentally pulled all the way out. If this happens, cover the insertion site with a gauze dressing. Do not throw the PICC away. Your health care provider will need to check it to be sure the entire catheter came out. You feel your heart racing or skipping beats, or you have chest pain. You have shortness of breath or trouble breathing. You have swelling, redness, warmth, or pain in the arm in which the PICC is placed. You have a red streak going up your arm that   starts under the PICC dressing. These symptoms may be an emergency. Get help right away. Call 911. Do not wait to see if the symptoms will go away. Do not drive yourself to the hospital. Summary A peripherally inserted central catheter (PICC) is a long, thin, flexible tube (catheter) that is put into a vein in the arm or leg. If cared for properly, a PICC can remain in place for many months. Having a PICC can allow you to go home from the hospital sooner and continue treatment at home. The PICC is inserted using a germ-free (sterile) technique by a specially trained health care provider. Only a trained health care provider should remove it. Do not have your blood pressure checked on the arm in which your PICC is placed. Always keep your PICC identification card with you. This information is not intended to replace advice given to you by your health care provider. Make sure you discuss any questions you have with your health care provider. Document Revised: 10/27/2020 Document Reviewed: 10/27/2020 Elsevier Patient Education  2022 [employer].  

## 2021-06-10 ENCOUNTER — Inpatient Hospital Stay: Payer: PRIVATE HEALTH INSURANCE

## 2021-06-10 ENCOUNTER — Other Ambulatory Visit: Payer: Self-pay

## 2021-06-10 VITALS — BP 123/70 | HR 96 | Temp 98.6°F | Resp 18

## 2021-06-10 DIAGNOSIS — C180 Malignant neoplasm of cecum: Secondary | ICD-10-CM

## 2021-06-10 DIAGNOSIS — Z5111 Encounter for antineoplastic chemotherapy: Secondary | ICD-10-CM | POA: Diagnosis not present

## 2021-06-10 MED ORDER — SODIUM CHLORIDE 0.9% FLUSH
10.0000 mL | INTRAVENOUS | Status: DC | PRN
  Administered 2021-06-10: 10 mL via INTRAVENOUS

## 2021-06-10 MED ORDER — HEPARIN SOD (PORK) LOCK FLUSH 100 UNIT/ML IV SOLN
500.0000 [IU] | Freq: Once | INTRAVENOUS | Status: AC
  Administered 2021-06-10: 500 [IU] via INTRAVENOUS

## 2021-06-10 NOTE — Patient Instructions (Signed)
PICC Home Care Guide A peripherally inserted central catheter (PICC) is a form of IV access that allows medicines and IV fluids to be quickly put into the blood and spread throughout the body. The PICC is a long, thin, flexible tube (catheter) that is put into a vein in a person's arm or leg. The catheter ends in a large vein just outside the heart called the superior vena cava (SVC). After the PICC is put in, a chest X-ray may be done to make sure that it is in the right place. A PICC may be placed for different reasons, such as: To give medicines and liquid nutrition. To give IV fluids and blood products. To take blood samples often. If there is trouble placing a peripheral intravenous (PIV) catheter. If cared for properly, a PICC can remain in place for many months. Having a PICC can allow you to go home from the hospital sooner and continue treatment at home. Medicines and PICC care can be managed at home by a family member, caregiver, or home health care team. What are the risks? Generally, having a PICC is safe. However, problems may occur, including: A blood clot (thrombus) forming in or at the end of the PICC. A blood clot forming in a vein (deep vein thrombosis) or traveling to the lung (pulmonary embolism). Inflammation of the vein (phlebitis) in which the PICC is placed. Infection at the insertion site or in the blood. Blood infections from central lines, like PICCs, can be serious and often require a hospital stay. PICC malposition, or PICC movement or poor placement. A break or cut in the PICC. Do not use scissors near the PICC. Nerve or tendon irritation or injury during PICC insertion. How to care for your PICC Please follow the specific guidelines provided by your health care provider. Preventing infection You and any caregivers should wash your hands often with soap and water for at least 20 seconds. Wash hands: Before touching the PICC or the infusion device. Before changing a  bandage (dressing). Do not change the dressing unless you have been taught to do so and have shown you are able to change it safely. Flush the PICC as told. Tell your health care provider right away if the PICC is hard to flush or does not flush. Do not use force to flush the PICC. Use clean and germ-free (sterile) supplies only. Keep the supplies in a dry place. Do not reuse needles, syringes, or any other supplies. Reusing supplies can lead to infection. Keep the PICC dressing dry and secure it with tape if the edges stop sticking to your skin. Check your PICC insertion site every day for signs of infection. Check for: Redness, swelling, or pain. Fluid or blood. Warmth. Pus or a bad smell. Preventing other problems Do not use a syringe that is less than 10 mL to flush the PICC. Do not have your blood pressure checked on the arm in which the PICC is placed. Do not ever pull or tug on the PICC. Keep it secured to your arm with tape or a stretch wrap when not in use. Do not take the PICC out yourself. Only a trained health care provider should remove the PICC. Keep pets and children away from your PICC. How to care for your PICC dressing Keep your PICC dressing clean and dry to prevent infection. Do not take baths, swim, or use a hot tub until your health care provider approves. Ask your health care provider if you can take   showers. You may only be allowed to take sponge baths. When you are allowed to shower: Ask your health care provider to teach you how to wrap the PICC. Cover the PICC with clear plastic wrap and tape to keep it dry while showering. Follow instructions from your health care provider about how to take care of your insertion site and dressing. Make sure you: Wash your hands with soap and water for at least 20 seconds before and after you change your dressing. If soap and water are not available, use hand sanitizer. Change your dressing only if taught to do so by your health care  provider. Your PICC dressing needs to be changed if it becomes loose or wet. Leave stitches (sutures), skin glue, or adhesive strips in place. These skin closures may need to stay in place for 2 weeks or longer. If adhesive strip edges start to loosen and curl up, you may trim the loose edges. Do not remove adhesive strips completely unless your health care provider tells you to do that. Follow these instructions at home: Disposal of supplies Throw away any syringes in a disposal container that is meant for sharp items (sharps container). You can buy a sharps container from a pharmacy, or you can make one by using an empty, hard plastic bottle with a lid. Place any used dressings or infusion bags into a plastic bag. Throw that bag in the trash. General instructions  Always carry your PICC identification card or wear a medical alert bracelet. Keep the tube clamped at all times, unless it is being used. Always carry a smooth-edge clamp with you to clamp the PICC if it breaks. Do not use scissors or sharp objects near the tube. You may bend your arm and move it freely. If your PICC is near or at the bend of your elbow, avoid activity with repeated motion at the elbow. Avoid lifting heavy objects as told by your health care provider. Keep all follow-up visits. This is important. You will need to have your PICC dressing changed at least once a week. Contact a health care provider if: You have pain in your arm, ear, face, or teeth. You have a fever or chills. You have redness, swelling, or pain around the insertion site. You have fluid or blood coming from the insertion site. Your insertion site feels warm to the touch. You have pus or a bad smell coming from the insertion site. Your skin feels hard and raised around the insertion site. Your PICC dressing has gotten wet or is coming off and you have not been taught how to change it. Get help right away if: You have problems with your PICC, such as  your PICC: Was tugged or pulled and has partially come out. Do not  push the PICC back in. Cannot be flushed, is hard to flush, or leaks around the insertion site when it is flushed. Makes a flushing sound when it is flushed. Appears to have a hole or tear. Is accidentally pulled all the way out. If this happens, cover the insertion site with a gauze dressing. Do not throw the PICC away. Your health care provider will need to check it to be sure the entire catheter came out. You feel your heart racing or skipping beats, or you have chest pain. You have shortness of breath or trouble breathing. You have swelling, redness, warmth, or pain in the arm in which the PICC is placed. You have a red streak going up your arm that   starts under the PICC dressing. These symptoms may be an emergency. Get help right away. Call 911. Do not wait to see if the symptoms will go away. Do not drive yourself to the hospital. Summary A peripherally inserted central catheter (PICC) is a long, thin, flexible tube (catheter) that is put into a vein in the arm or leg. If cared for properly, a PICC can remain in place for many months. Having a PICC can allow you to go home from the hospital sooner and continue treatment at home. The PICC is inserted using a germ-free (sterile) technique by a specially trained health care provider. Only a trained health care provider should remove it. Do not have your blood pressure checked on the arm in which your PICC is placed. Always keep your PICC identification card with you. This information is not intended to replace advice given to you by your health care provider. Make sure you discuss any questions you have with your health care provider. Document Revised: 10/27/2020 Document Reviewed: 10/27/2020 Elsevier Patient Education  2022 [employer].  

## 2021-06-14 ENCOUNTER — Other Ambulatory Visit: Payer: Self-pay

## 2021-06-14 ENCOUNTER — Inpatient Hospital Stay: Payer: PRIVATE HEALTH INSURANCE

## 2021-06-14 ENCOUNTER — Telehealth: Payer: Self-pay

## 2021-06-14 ENCOUNTER — Encounter: Payer: Self-pay | Admitting: Hematology & Oncology

## 2021-06-14 ENCOUNTER — Inpatient Hospital Stay (HOSPITAL_BASED_OUTPATIENT_CLINIC_OR_DEPARTMENT_OTHER): Payer: PRIVATE HEALTH INSURANCE | Admitting: Hematology & Oncology

## 2021-06-14 ENCOUNTER — Ambulatory Visit: Payer: PRIVATE HEALTH INSURANCE

## 2021-06-14 VITALS — BP 130/84 | HR 88 | Temp 98.3°F | Resp 20 | Wt 264.0 lb

## 2021-06-14 VITALS — BP 130/84 | HR 88 | Temp 98.3°F | Resp 20 | Ht 62.99 in | Wt 264.0 lb

## 2021-06-14 DIAGNOSIS — I2690 Septic pulmonary embolism without acute cor pulmonale: Secondary | ICD-10-CM

## 2021-06-14 DIAGNOSIS — C180 Malignant neoplasm of cecum: Secondary | ICD-10-CM

## 2021-06-14 DIAGNOSIS — Z5111 Encounter for antineoplastic chemotherapy: Secondary | ICD-10-CM | POA: Diagnosis not present

## 2021-06-14 DIAGNOSIS — D509 Iron deficiency anemia, unspecified: Secondary | ICD-10-CM

## 2021-06-14 LAB — CBC WITH DIFFERENTIAL (CANCER CENTER ONLY)
Abs Immature Granulocytes: 0.02 10*3/uL (ref 0.00–0.07)
Basophils Absolute: 0 10*3/uL (ref 0.0–0.1)
Basophils Relative: 1 %
Eosinophils Absolute: 0 10*3/uL (ref 0.0–0.5)
Eosinophils Relative: 1 %
HCT: 32.9 % — ABNORMAL LOW (ref 36.0–46.0)
Hemoglobin: 10.6 g/dL — ABNORMAL LOW (ref 12.0–15.0)
Immature Granulocytes: 0 %
Lymphocytes Relative: 39 %
Lymphs Abs: 1.8 10*3/uL (ref 0.7–4.0)
MCH: 29.9 pg (ref 26.0–34.0)
MCHC: 32.2 g/dL (ref 30.0–36.0)
MCV: 92.7 fL (ref 80.0–100.0)
Monocytes Absolute: 0.5 10*3/uL (ref 0.1–1.0)
Monocytes Relative: 11 %
Neutro Abs: 2.2 10*3/uL (ref 1.7–7.7)
Neutrophils Relative %: 48 %
Platelet Count: 268 10*3/uL (ref 150–400)
RBC: 3.55 MIL/uL — ABNORMAL LOW (ref 3.87–5.11)
RDW: 16.1 % — ABNORMAL HIGH (ref 11.5–15.5)
Smear Review: NORMAL
WBC Count: 4.7 10*3/uL (ref 4.0–10.5)
WBC Morphology: ABNORMAL
nRBC: 0 % (ref 0.0–0.2)

## 2021-06-14 LAB — CMP (CANCER CENTER ONLY)
ALT: 23 U/L (ref 0–44)
AST: 20 U/L (ref 15–41)
Albumin: 3.3 g/dL — ABNORMAL LOW (ref 3.5–5.0)
Alkaline Phosphatase: 91 U/L (ref 38–126)
Anion gap: 7 (ref 5–15)
BUN: 7 mg/dL (ref 6–20)
CO2: 29 mmol/L (ref 22–32)
Calcium: 8.6 mg/dL — ABNORMAL LOW (ref 8.9–10.3)
Chloride: 106 mmol/L (ref 98–111)
Creatinine: 0.7 mg/dL (ref 0.44–1.00)
GFR, Estimated: >60 mL/min (ref >60)
Glucose, Bld: 109 mg/dL — ABNORMAL HIGH (ref 70–99)
Potassium: 3.1 mmol/L — ABNORMAL LOW (ref 3.5–5.1)
Sodium: 142 mmol/L (ref 135–145)
Total Bilirubin: 0.3 mg/dL (ref 0.3–1.2)
Total Protein: 6 g/dL — ABNORMAL LOW (ref 6.5–8.1)

## 2021-06-14 LAB — CEA (IN HOUSE-CHCC): CEA (CHCC-In House): 4.29 ng/mL (ref 0.00–5.00)

## 2021-06-14 LAB — IRON AND IRON BINDING CAPACITY (CC-WL,HP ONLY)
Iron: 63 ug/dL (ref 28–170)
Saturation Ratios: 23 % (ref 10.4–31.8)
TIBC: 272 ug/dL (ref 250–450)
UIBC: 209 ug/dL (ref 148–442)

## 2021-06-14 LAB — FERRITIN: Ferritin: 355 ng/mL — ABNORMAL HIGH (ref 11–307)

## 2021-06-14 MED ORDER — SODIUM CHLORIDE 0.9 % IV SOLN
150.0000 mg | Freq: Once | INTRAVENOUS | Status: AC
  Administered 2021-06-14: 150 mg via INTRAVENOUS
  Filled 2021-06-14: qty 150

## 2021-06-14 MED ORDER — HEPARIN SOD (PORK) LOCK FLUSH 100 UNIT/ML IV SOLN
500.0000 [IU] | Freq: Once | INTRAVENOUS | Status: AC
  Administered 2021-06-14: 500 [IU]

## 2021-06-14 MED ORDER — SODIUM CHLORIDE 0.9 % IV SOLN
2400.0000 mg/m2 | INTRAVENOUS | Status: DC
  Administered 2021-06-14: 5750 mg via INTRAVENOUS
  Filled 2021-06-14: qty 115

## 2021-06-14 MED ORDER — DEXTROSE 5 % IV SOLN: Freq: Once | INTRAVENOUS | Status: AC

## 2021-06-14 MED ORDER — PALONOSETRON HCL INJECTION 0.25 MG/5ML
0.2500 mg | Freq: Once | INTRAVENOUS | Status: AC
  Administered 2021-06-14: 0.25 mg via INTRAVENOUS
  Filled 2021-06-14: qty 5

## 2021-06-14 MED ORDER — ATROPINE SULFATE 1 MG/ML IV SOLN
0.5000 mg | Freq: Once | INTRAVENOUS | Status: AC | PRN
  Administered 2021-06-14: 0.5 mg via INTRAVENOUS
  Filled 2021-06-14: qty 1

## 2021-06-14 MED ORDER — SODIUM CHLORIDE 0.9 % IV SOLN
165.0000 mg/m2 | Freq: Once | INTRAVENOUS | Status: AC
  Administered 2021-06-14: 400 mg via INTRAVENOUS
  Filled 2021-06-14: qty 15

## 2021-06-14 MED ORDER — DEXAMETHASONE 6 MG PO TABS: 6.0000 mg | ORAL_TABLET | ORAL | 2 refills | Status: DC

## 2021-06-14 MED ORDER — SODIUM CHLORIDE 0.9% FLUSH
10.0000 mL | Freq: Once | INTRAVENOUS | Status: AC
  Administered 2021-06-14: 10 mL

## 2021-06-14 MED ORDER — SODIUM CHLORIDE 0.9 % IV SOLN
10.0000 mg | Freq: Once | INTRAVENOUS | Status: AC
  Administered 2021-06-14: 10 mg via INTRAVENOUS
  Filled 2021-06-14: qty 10

## 2021-06-14 MED ORDER — SODIUM CHLORIDE 0.9 % IV SOLN
400.0000 mg/m2 | Freq: Once | INTRAVENOUS | Status: AC
  Administered 2021-06-14: 956 mg via INTRAVENOUS
  Filled 2021-06-14: qty 47.8

## 2021-06-14 MED ORDER — SODIUM CHLORIDE 0.9 % IV SOLN: Freq: Once | INTRAVENOUS | Status: DC

## 2021-06-14 NOTE — Patient Instructions (Signed)
[facility]  Discharge Instructions: Thank you for choosing [facility] to provide your oncology and hematology care.   If you have a lab appointment with the [facility], please go directly to the [facility] and check in at the registration area.  Wear comfortable clothing and clothing appropriate for easy access to any Portacath or PICC line.   We strive to give you quality time with your provider. You may need to reschedule your appointment if you arrive late (15 or more minutes).  Arriving late affects you and other patients whose appointments are after yours.  Also, if you miss three or more appointments without notifying the office, you may be dismissed from the clinic at the providers discretion.      For prescription refill requests, have your pharmacy contact our office and allow 72 hours for refills to be completed.    Today you received the following chemotherapy and/or immunotherapy agents Folfoxiri      To help prevent nausea and vomiting after your treatment, we encourage you to take your nausea medication as directed.  BELOW ARE SYMPTOMS THAT SHOULD BE REPORTED IMMEDIATELY: *FEVER GREATER THAN 100.4 F (38 C) OR HIGHER *CHILLS OR SWEATING *NAUSEA AND VOMITING THAT IS NOT CONTROLLED WITH YOUR NAUSEA MEDICATION *UNUSUAL SHORTNESS OF BREATH *UNUSUAL BRUISING OR BLEEDING *URINARY PROBLEMS (pain or burning when urinating, or frequent urination) *BOWEL PROBLEMS (unusual diarrhea, constipation, pain near the anus) TENDERNESS IN MOUTH AND THROAT WITH OR WITHOUT PRESENCE OF ULCERS (sore throat, sores in mouth, or a toothache) UNUSUAL RASH, SWELLING OR PAIN  UNUSUAL VAGINAL DISCHARGE OR ITCHING   Items with * indicate a potential emergency and should be followed up as soon as possible or go to the Emergency Department if any problems should occur.  Please show the CHEMOTHERAPY ALERT CARD or IMMUNOTHERAPY ALERT CARD at check-in to the  Emergency Department and triage nurse. Should you have questions after your visit or need to cancel or reschedule your appointment, please contact [facility]  [phone] and follow the prompts.  Office hours are 8:00 a.m. to 4:30 p.m. Monday - Friday. Please note that voicemails left after 4:00 p.m. may not be returned until the following business day.  We are closed weekends and major holidays. You have access to a nurse at all times for urgent questions. Please call the main number to the clinic [phone] and follow the prompts.  For any non-urgent questions, you may also contact your provider using MyChart. We now offer e-Visits for anyone 52 and older to request care online for non-urgent symptoms. For details visit [email or web address].   Also download the MyChart app! Go to the app store, search "MyChart", open the app, select [facility], and log in with your MyChart username and password.  Due to Covid, a mask is required upon entering the hospital/clinic. If you do not have a mask, one will be given to you upon arrival. For doctor visits, patients may have 1 support person aged 47 or older with them. For treatment visits, patients cannot have anyone with them due to current Covid guidelines and our immunocompromised population.

## 2021-06-14 NOTE — Progress Notes (Signed)
Hematology and Oncology Follow Up Visit  Lauren Mcpherson [id number] 1965-06-06 56 y.o. 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 daily-start on 01/22/2021   Interim History:  Lauren Mcpherson is here today for follow-up and treatment.  So far, she is doing pretty well.  She has tolerated treatment pretty well.  She has had 12 cycles of treatment.  After this cycle, we will set up an MRI so we can take a look at the liver and see how everything looks.  She has had no problems with nausea or vomiting.  There is been no change in bowel or bladder habits.  She has had no rashes.  She has had no leg swelling.  Her last CEA was stable at 5.9.  She has had no cough.  There is been no bleeding.  She is on Xarelto.  There is been no chest wall pain.  Overall, her performance status is ECOG 1.     Medications:  Allergies as of 06/14/2021   No Known Allergies      Medication List        Accurate as of June 14, 2021  9:07 AM. If you have any questions, ask your nurse or doctor.          dexamethasone 6 MG tablet Commonly known as: DECADRON Take 1 tablet (6 mg total) by mouth See admin instructions. Take 6 mg by mouth daily for 3 days, starting the day after chemotherapy. Take with food.   loperamide 2 MG tablet Commonly known as: Imodium A-D Take 2 at onset of diarrhea, then 1 every 2hrs until 12hr without a BM. May take 2 tab every 4hrs at bedtime. If diarrhea recurs repeat.   ondansetron 8 MG tablet Commonly known as: Zofran Take 1 tablet (8 mg total) by mouth 2 (two) times daily as needed. Start on day 3 after chemotherapy.   prochlorperazine 10 MG tablet Commonly known as: COMPAZINE Take 1 tablet (10 mg  total) by mouth every 6 (six) hours as needed (Nausea or vomiting).   rivaroxaban 20 MG Tabs tablet Commonly known as: XARELTO Take 1 tablet (20 mg total) by mouth daily with supper.        Allergies: No Known Allergies  Past Medical History, Surgical history, Social history, and Family History were reviewed and updated.  Review of Systems: Review of Systems  Constitutional: Negative.   HENT: Negative.    Eyes: Negative.   Respiratory: Negative.    Cardiovascular: Negative.   Gastrointestinal: Negative.   Genitourinary: Negative.   Musculoskeletal: Negative.   Skin: Negative.   Neurological: Negative.   Endo/Heme/Allergies: Negative.   Psychiatric/Behavioral: Negative.      Physical Exam:  weight is 264 lb (119.7 kg). Her oral temperature is 98.3 F (36.8 C). Her blood pressure is 130/84 and her pulse is 88. Her respiration is 20 and oxygen saturation is 100%.   Wt Readings from Last 3 Encounters:  06/14/21 264 lb (119.7 kg)  06/14/21 264 lb (119.7 kg)  05/30/21 263 lb (119.3 kg)    Physical Exam Vitals reviewed.  HENT:     Head: Normocephalic and atraumatic.  Eyes:     Pupils: Pupils are equal, round, and reactive to  light.  Cardiovascular:     Rate and Rhythm: Normal rate and regular rhythm.     Heart sounds: Normal heart sounds.  Pulmonary:     Effort: Pulmonary effort is normal.     Breath sounds: Normal breath sounds.  Abdominal:     General: Bowel sounds are normal.     Palpations: Abdomen is soft.     Comments: Her abdomen is soft.  Her abdomen is somewhat obese.  She has a well-healed laparotomy scar in the midline.  There is no fluid wave.  There is no guarding or rebound tenderness.  There is no abdominal mass.  There is no palpable liver or spleen tip.  Musculoskeletal:        General: No tenderness or deformity. Normal range of motion.     Cervical back: Normal range of motion.  Lymphadenopathy:     Cervical: No cervical adenopathy.  Skin:     General: Skin is warm and dry.     Findings: No erythema or rash.  Neurological:     Mental Status: She is alert and oriented to person, place, and time.  Psychiatric:        Behavior: Behavior normal.        Thought Content: Thought content normal.        Judgment: Judgment normal.    Lab Results  Component Value Date   WBC 4.7 06/14/2021   HGB 10.6 (L) 06/14/2021   HCT 32.9 (L) 06/14/2021   MCV 92.7 06/14/2021   PLT 268 06/14/2021   Lab Results  Component Value Date   FERRITIN 346 (H) 05/30/2021   IRON 51 05/30/2021   TIBC 265 05/30/2021   UIBC 214 05/30/2021   IRONPCTSAT 19 05/30/2021   Lab Results  Component Value Date   RETICCTPCT 3.0 01/04/2021   RBC 3.55 (L) 06/14/2021   No results found for: KPAFRELGTCHN, LAMBDASER, KAPLAMBRATIO No results found for: IGGSERUM, IGA, IGMSERUM No results found for: Odetta Pink, SPEI   Chemistry      Component Value Date/Time   NA 142 06/14/2021 0820   K 3.1 (L) 06/14/2021 0820   CL 106 06/14/2021 0820   CO2 29 06/14/2021 0820   BUN 7 06/14/2021 0820   CREATININE 0.70 06/14/2021 0820      Component Value Date/Time   CALCIUM 8.6 (L) 06/14/2021 0820   ALKPHOS 91 06/14/2021 0820   AST 20 06/14/2021 0820   ALT 23 06/14/2021 0820   BILITOT 0.3 06/14/2021 0820       Impression and Plan: Lauren Mcpherson is a very pleasant 56 yo African American female with metastatic adenocarcinoma of the cecum.   She has the K-RAS mutation so are unable to use EGFR inhibitors.   Again, we will go ahead and repeat the MRI after this cycle of treatment.  Hopefully, we will see that there is still stable disease.  We will continue her on the Xarelto.  Unfortunately, we cannot use Avastin because of the thromboembolic disease.  I will give her a dose of potassium today in the office.  I do not think she needs potassium as an outpatient.  We will plan to get her back in another couple of weeks.      [name], MD 2/21/20239:07 AM

## 2021-06-14 NOTE — Telephone Encounter (Signed)
Pt is in office today for treatment. Pt is requesting a refill on her Decadron 6 mg. Refill sent to pharmacy on file.

## 2021-06-16 ENCOUNTER — Inpatient Hospital Stay: Payer: PRIVATE HEALTH INSURANCE

## 2021-06-16 ENCOUNTER — Other Ambulatory Visit: Payer: Self-pay

## 2021-06-16 VITALS — BP 125/76 | HR 87 | Temp 98.0°F | Resp 18

## 2021-06-16 DIAGNOSIS — Z5111 Encounter for antineoplastic chemotherapy: Secondary | ICD-10-CM | POA: Diagnosis not present

## 2021-06-16 DIAGNOSIS — C180 Malignant neoplasm of cecum: Secondary | ICD-10-CM

## 2021-06-16 MED ORDER — HEPARIN SOD (PORK) LOCK FLUSH 100 UNIT/ML IV SOLN
500.0000 [IU] | Freq: Once | INTRAVENOUS | Status: AC | PRN
  Administered 2021-06-16: 500 [IU]

## 2021-06-16 MED ORDER — PEGFILGRASTIM INJECTION 6 MG/0.6ML ~~LOC~~
6.0000 mg | PREFILLED_SYRINGE | Freq: Once | SUBCUTANEOUS | Status: AC
  Administered 2021-06-16: 6 mg via SUBCUTANEOUS
  Filled 2021-06-16: qty 0.6

## 2021-06-16 MED ORDER — SODIUM CHLORIDE 0.9% FLUSH
10.0000 mL | INTRAVENOUS | Status: DC | PRN
  Administered 2021-06-16: 10 mL

## 2021-06-16 NOTE — Patient Instructions (Signed)
PICC Home Care Guide A peripherally inserted central catheter (PICC) is a form of IV access that allows medicines and IV fluids to be quickly put into the blood and spread throughout the body. The PICC is a long, thin, flexible tube (catheter) that is put into a vein in a person's arm or leg. The catheter ends in a large vein just outside the heart called the superior vena cava (SVC). After the PICC is put in, a chest X-ray may be done to make sure that it is in the right place. A PICC may be placed for different reasons, such as: To give medicines and liquid nutrition. To give IV fluids and blood products. To take blood samples often. If there is trouble placing a peripheral intravenous (PIV) catheter. If cared for properly, a PICC can remain in place for many months. Having a PICC can allow you to go home from the hospital sooner and continue treatment at home. Medicines and PICC care can be managed at home by a family member, caregiver, or home health care team. What are the risks? Generally, having a PICC is safe. However, problems may occur, including: A blood clot (thrombus) forming in or at the end of the PICC. A blood clot forming in a vein (deep vein thrombosis) or traveling to the lung (pulmonary embolism). Inflammation of the vein (phlebitis) in which the PICC is placed. Infection at the insertion site or in the blood. Blood infections from central lines, like PICCs, can be serious and often require a hospital stay. PICC malposition, or PICC movement or poor placement. A break or cut in the PICC. Do not use scissors near the PICC. Nerve or tendon irritation or injury during PICC insertion. How to care for your PICC Please follow the specific guidelines provided by your health care provider. Preventing infection You and any caregivers should wash your hands often with soap and water for at least 20 seconds. Wash hands: Before touching the PICC or the infusion device. Before changing a  bandage (dressing). Do not change the dressing unless you have been taught to do so and have shown you are able to change it safely. Flush the PICC as told. Tell your health care provider right away if the PICC is hard to flush or does not flush. Do not use force to flush the PICC. Use clean and germ-free (sterile) supplies only. Keep the supplies in a dry place. Do not reuse needles, syringes, or any other supplies. Reusing supplies can lead to infection. Keep the PICC dressing dry and secure it with tape if the edges stop sticking to your skin. Check your PICC insertion site every day for signs of infection. Check for: Redness, swelling, or pain. Fluid or blood. Warmth. Pus or a bad smell. Preventing other problems Do not use a syringe that is less than 10 mL to flush the PICC. Do not have your blood pressure checked on the arm in which the PICC is placed. Do not ever pull or tug on the PICC. Keep it secured to your arm with tape or a stretch wrap when not in use. Do not take the PICC out yourself. Only a trained health care provider should remove the PICC. Keep pets and children away from your PICC. How to care for your PICC dressing Keep your PICC dressing clean and dry to prevent infection. Do not take baths, swim, or use a hot tub until your health care provider approves. Ask your health care provider if you can take   showers. You may only be allowed to take sponge baths. When you are allowed to shower: Ask your health care provider to teach you how to wrap the PICC. Cover the PICC with clear plastic wrap and tape to keep it dry while showering. Follow instructions from your health care provider about how to take care of your insertion site and dressing. Make sure you: Wash your hands with soap and water for at least 20 seconds before and after you change your dressing. If soap and water are not available, use hand sanitizer. Change your dressing only if taught to do so by your health care  provider. Your PICC dressing needs to be changed if it becomes loose or wet. Leave stitches (sutures), skin glue, or adhesive strips in place. These skin closures may need to stay in place for 2 weeks or longer. If adhesive strip edges start to loosen and curl up, you may trim the loose edges. Do not remove adhesive strips completely unless your health care provider tells you to do that. Follow these instructions at home: Disposal of supplies Throw away any syringes in a disposal container that is meant for sharp items (sharps container). You can buy a sharps container from a pharmacy, or you can make one by using an empty, hard plastic bottle with a lid. Place any used dressings or infusion bags into a plastic bag. Throw that bag in the trash. General instructions  Always carry your PICC identification card or wear a medical alert bracelet. Keep the tube clamped at all times, unless it is being used. Always carry a smooth-edge clamp with you to clamp the PICC if it breaks. Do not use scissors or sharp objects near the tube. You may bend your arm and move it freely. If your PICC is near or at the bend of your elbow, avoid activity with repeated motion at the elbow. Avoid lifting heavy objects as told by your health care provider. Keep all follow-up visits. This is important. You will need to have your PICC dressing changed at least once a week. Contact a health care provider if: You have pain in your arm, ear, face, or teeth. You have a fever or chills. You have redness, swelling, or pain around the insertion site. You have fluid or blood coming from the insertion site. Your insertion site feels warm to the touch. You have pus or a bad smell coming from the insertion site. Your skin feels hard and raised around the insertion site. Your PICC dressing has gotten wet or is coming off and you have not been taught how to change it. Get help right away if: You have problems with your PICC, such as  your PICC: Was tugged or pulled and has partially come out. Do not  push the PICC back in. Cannot be flushed, is hard to flush, or leaks around the insertion site when it is flushed. Makes a flushing sound when it is flushed. Appears to have a hole or tear. Is accidentally pulled all the way out. If this happens, cover the insertion site with a gauze dressing. Do not throw the PICC away. Your health care provider will need to check it to be sure the entire catheter came out. You feel your heart racing or skipping beats, or you have chest pain. You have shortness of breath or trouble breathing. You have swelling, redness, warmth, or pain in the arm in which the PICC is placed. You have a red streak going up your arm that   starts under the PICC dressing. These symptoms may be an emergency. Get help right away. Call 911. Do not wait to see if the symptoms will go away. Do not drive yourself to the hospital. Summary A peripherally inserted central catheter (PICC) is a long, thin, flexible tube (catheter) that is put into a vein in the arm or leg. If cared for properly, a PICC can remain in place for many months. Having a PICC can allow you to go home from the hospital sooner and continue treatment at home. The PICC is inserted using a germ-free (sterile) technique by a specially trained health care provider. Only a trained health care provider should remove it. Do not have your blood pressure checked on the arm in which your PICC is placed. Always keep your PICC identification card with you. This information is not intended to replace advice given to you by your health care provider. Make sure you discuss any questions you have with your health care provider. Document Revised: 10/27/2020 Document Reviewed: 10/27/2020 Elsevier Patient Education  2022 [employer].  

## 2021-06-17 ENCOUNTER — Inpatient Hospital Stay: Payer: PRIVATE HEALTH INSURANCE

## 2021-06-20 ENCOUNTER — Inpatient Hospital Stay: Payer: PRIVATE HEALTH INSURANCE

## 2021-06-20 ENCOUNTER — Other Ambulatory Visit: Payer: Self-pay

## 2021-06-20 VITALS — BP 110/82 | HR 103 | Temp 98.9°F | Resp 20

## 2021-06-20 DIAGNOSIS — C180 Malignant neoplasm of cecum: Secondary | ICD-10-CM

## 2021-06-20 DIAGNOSIS — Z5111 Encounter for antineoplastic chemotherapy: Secondary | ICD-10-CM | POA: Diagnosis not present

## 2021-06-20 MED ORDER — HEPARIN SOD (PORK) LOCK FLUSH 100 UNIT/ML IV SOLN
250.0000 [IU] | Freq: Once | INTRAVENOUS | Status: AC
  Administered 2021-06-20: 250 [IU]

## 2021-06-20 MED ORDER — SODIUM CHLORIDE 0.9% FLUSH
10.0000 mL | Freq: Once | INTRAVENOUS | Status: AC
  Administered 2021-06-20: 10 mL

## 2021-06-22 ENCOUNTER — Other Ambulatory Visit: Payer: Self-pay

## 2021-06-22 ENCOUNTER — Inpatient Hospital Stay: Payer: PRIVATE HEALTH INSURANCE | Attending: Hematology & Oncology

## 2021-06-22 VITALS — BP 119/90 | HR 90 | Resp 18

## 2021-06-22 DIAGNOSIS — Z452 Encounter for adjustment and management of vascular access device: Secondary | ICD-10-CM | POA: Diagnosis not present

## 2021-06-22 DIAGNOSIS — Z5111 Encounter for antineoplastic chemotherapy: Secondary | ICD-10-CM | POA: Insufficient documentation

## 2021-06-22 DIAGNOSIS — Z7901 Long term (current) use of anticoagulants: Secondary | ICD-10-CM | POA: Insufficient documentation

## 2021-06-22 DIAGNOSIS — I2699 Other pulmonary embolism without acute cor pulmonale: Secondary | ICD-10-CM | POA: Insufficient documentation

## 2021-06-22 DIAGNOSIS — I82411 Acute embolism and thrombosis of right femoral vein: Secondary | ICD-10-CM | POA: Insufficient documentation

## 2021-06-22 DIAGNOSIS — C787 Secondary malignant neoplasm of liver and intrahepatic bile duct: Secondary | ICD-10-CM | POA: Insufficient documentation

## 2021-06-22 DIAGNOSIS — C180 Malignant neoplasm of cecum: Secondary | ICD-10-CM | POA: Insufficient documentation

## 2021-06-22 DIAGNOSIS — Z5189 Encounter for other specified aftercare: Secondary | ICD-10-CM | POA: Diagnosis not present

## 2021-06-22 MED ORDER — HEPARIN SOD (PORK) LOCK FLUSH 100 UNIT/ML IV SOLN
250.0000 [IU] | Freq: Once | INTRAVENOUS | Status: AC
  Administered 2021-06-22: 250 [IU]

## 2021-06-22 MED ORDER — SODIUM CHLORIDE 0.9% FLUSH
10.0000 mL | Freq: Once | INTRAVENOUS | Status: AC
  Administered 2021-06-22: 10 mL

## 2021-06-22 NOTE — Patient Instructions (Signed)
PICC Home Care Guide A peripherally inserted central catheter (PICC) is a form of IV access that allows medicines and IV fluids to be quickly put into the blood and spread throughout the body. The PICC is a long, thin, flexible tube (catheter) that is put into a vein in a person's arm or leg. The catheter ends in a large vein just outside the heart called the superior vena cava (SVC). After the PICC is put in, a chest X-ray may be done to make sure that it is in the right place. A PICC may be placed for different reasons, such as: To give medicines and liquid nutrition. To give IV fluids and blood products. To take blood samples often. If there is trouble placing a peripheral intravenous (PIV) catheter. If cared for properly, a PICC can remain in place for many months. Having a PICC can allow you to go home from the hospital sooner and continue treatment at home. Medicines and PICC care can be managed at home by a family member, caregiver, or home health care team. What are the risks? Generally, having a PICC is safe. However, problems may occur, including: A blood clot (thrombus) forming in or at the end of the PICC. A blood clot forming in a vein (deep vein thrombosis) or traveling to the lung (pulmonary embolism). Inflammation of the vein (phlebitis) in which the PICC is placed. Infection at the insertion site or in the blood. Blood infections from central lines, like PICCs, can be serious and often require a hospital stay. PICC malposition, or PICC movement or poor placement. A break or cut in the PICC. Do not use scissors near the PICC. Nerve or tendon irritation or injury during PICC insertion. How to care for your PICC Please follow the specific guidelines provided by your health care provider. Preventing infection You and any caregivers should wash your hands often with soap and water for at least 20 seconds. Wash hands: Before touching the PICC or the infusion device. Before changing a  bandage (dressing). Do not change the dressing unless you have been taught to do so and have shown you are able to change it safely. Flush the PICC as told. Tell your health care provider right away if the PICC is hard to flush or does not flush. Do not use force to flush the PICC. Use clean and germ-free (sterile) supplies only. Keep the supplies in a dry place. Do not reuse needles, syringes, or any other supplies. Reusing supplies can lead to infection. Keep the PICC dressing dry and secure it with tape if the edges stop sticking to your skin. Check your PICC insertion site every day for signs of infection. Check for: Redness, swelling, or pain. Fluid or blood. Warmth. Pus or a bad smell. Preventing other problems Do not use a syringe that is less than 10 mL to flush the PICC. Do not have your blood pressure checked on the arm in which the PICC is placed. Do not ever pull or tug on the PICC. Keep it secured to your arm with tape or a stretch wrap when not in use. Do not take the PICC out yourself. Only a trained health care provider should remove the PICC. Keep pets and children away from your PICC. How to care for your PICC dressing Keep your PICC dressing clean and dry to prevent infection. Do not take baths, swim, or use a hot tub until your health care provider approves. Ask your health care provider if you can take   showers. You may only be allowed to take sponge baths. When you are allowed to shower: Ask your health care provider to teach you how to wrap the PICC. Cover the PICC with clear plastic wrap and tape to keep it dry while showering. Follow instructions from your health care provider about how to take care of your insertion site and dressing. Make sure you: Wash your hands with soap and water for at least 20 seconds before and after you change your dressing. If soap and water are not available, use hand sanitizer. Change your dressing only if taught to do so by your health care  provider. Your PICC dressing needs to be changed if it becomes loose or wet. Leave stitches (sutures), skin glue, or adhesive strips in place. These skin closures may need to stay in place for 2 weeks or longer. If adhesive strip edges start to loosen and curl up, you may trim the loose edges. Do not remove adhesive strips completely unless your health care provider tells you to do that. Follow these instructions at home: Disposal of supplies Throw away any syringes in a disposal container that is meant for sharp items (sharps container). You can buy a sharps container from a pharmacy, or you can make one by using an empty, hard plastic bottle with a lid. Place any used dressings or infusion bags into a plastic bag. Throw that bag in the trash. General instructions  Always carry your PICC identification card or wear a medical alert bracelet. Keep the tube clamped at all times, unless it is being used. Always carry a smooth-edge clamp with you to clamp the PICC if it breaks. Do not use scissors or sharp objects near the tube. You may bend your arm and move it freely. If your PICC is near or at the bend of your elbow, avoid activity with repeated motion at the elbow. Avoid lifting heavy objects as told by your health care provider. Keep all follow-up visits. This is important. You will need to have your PICC dressing changed at least once a week. Contact a health care provider if: You have pain in your arm, ear, face, or teeth. You have a fever or chills. You have redness, swelling, or pain around the insertion site. You have fluid or blood coming from the insertion site. Your insertion site feels warm to the touch. You have pus or a bad smell coming from the insertion site. Your skin feels hard and raised around the insertion site. Your PICC dressing has gotten wet or is coming off and you have not been taught how to change it. Get help right away if: You have problems with your PICC, such as  your PICC: Was tugged or pulled and has partially come out. Do not  push the PICC back in. Cannot be flushed, is hard to flush, or leaks around the insertion site when it is flushed. Makes a flushing sound when it is flushed. Appears to have a hole or tear. Is accidentally pulled all the way out. If this happens, cover the insertion site with a gauze dressing. Do not throw the PICC away. Your health care provider will need to check it to be sure the entire catheter came out. You feel your heart racing or skipping beats, or you have chest pain. You have shortness of breath or trouble breathing. You have swelling, redness, warmth, or pain in the arm in which the PICC is placed. You have a red streak going up your arm that   starts under the PICC dressing. These symptoms may be an emergency. Get help right away. Call 911. Do not wait to see if the symptoms will go away. Do not drive yourself to the hospital. Summary A peripherally inserted central catheter (PICC) is a long, thin, flexible tube (catheter) that is put into a vein in the arm or leg. If cared for properly, a PICC can remain in place for many months. Having a PICC can allow you to go home from the hospital sooner and continue treatment at home. The PICC is inserted using a germ-free (sterile) technique by a specially trained health care provider. Only a trained health care provider should remove it. Do not have your blood pressure checked on the arm in which your PICC is placed. Always keep your PICC identification card with you. This information is not intended to replace advice given to you by your health care provider. Make sure you discuss any questions you have with your health care provider. Document Revised: 10/27/2020 Document Reviewed: 10/27/2020 Elsevier Patient Education  2022 [employer].  

## 2021-06-24 ENCOUNTER — Other Ambulatory Visit: Payer: Self-pay

## 2021-06-24 ENCOUNTER — Inpatient Hospital Stay: Payer: PRIVATE HEALTH INSURANCE

## 2021-06-24 VITALS — BP 133/99 | HR 113 | Resp 18

## 2021-06-24 DIAGNOSIS — Z5111 Encounter for antineoplastic chemotherapy: Secondary | ICD-10-CM | POA: Diagnosis not present

## 2021-06-24 DIAGNOSIS — C180 Malignant neoplasm of cecum: Secondary | ICD-10-CM

## 2021-06-24 MED ORDER — SODIUM CHLORIDE 0.9% FLUSH
10.0000 mL | Freq: Once | INTRAVENOUS | Status: AC
  Administered 2021-06-24: 10 mL

## 2021-06-24 MED ORDER — HEPARIN SOD (PORK) LOCK FLUSH 100 UNIT/ML IV SOLN
250.0000 [IU] | Freq: Once | INTRAVENOUS | Status: AC
  Administered 2021-06-24: 250 [IU]

## 2021-06-24 NOTE — Patient Instructions (Signed)
PICC Home Care Guide A peripherally inserted central catheter (PICC) is a form of IV access that allows medicines and IV fluids to be quickly put into the blood and spread throughout the body. The PICC is a long, thin, flexible tube (catheter) that is put into a vein in a person's arm or leg. The catheter ends in a large vein just outside the heart called the superior vena cava (SVC). After the PICC is put in, a chest X-ray may be done to make sure that it is in the right place. A PICC may be placed for different reasons, such as: To give medicines and liquid nutrition. To give IV fluids and blood products. To take blood samples often. If there is trouble placing a peripheral intravenous (PIV) catheter. If cared for properly, a PICC can remain in place for many months. Having a PICC can allow you to go home from the hospital sooner and continue treatment at home. Medicines and PICC care can be managed at home by a family member, caregiver, or home health care team. What are the risks? Generally, having a PICC is safe. However, problems may occur, including: A blood clot (thrombus) forming in or at the end of the PICC. A blood clot forming in a vein (deep vein thrombosis) or traveling to the lung (pulmonary embolism). Inflammation of the vein (phlebitis) in which the PICC is placed. Infection at the insertion site or in the blood. Blood infections from central lines, like PICCs, can be serious and often require a hospital stay. PICC malposition, or PICC movement or poor placement. A break or cut in the PICC. Do not use scissors near the PICC. Nerve or tendon irritation or injury during PICC insertion. How to care for your PICC Please follow the specific guidelines provided by your health care provider. Preventing infection You and any caregivers should wash your hands often with soap and water for at least 20 seconds. Wash hands: Before touching the PICC or the infusion device. Before changing a  bandage (dressing). Do not change the dressing unless you have been taught to do so and have shown you are able to change it safely. Flush the PICC as told. Tell your health care provider right away if the PICC is hard to flush or does not flush. Do not use force to flush the PICC. Use clean and germ-free (sterile) supplies only. Keep the supplies in a dry place. Do not reuse needles, syringes, or any other supplies. Reusing supplies can lead to infection. Keep the PICC dressing dry and secure it with tape if the edges stop sticking to your skin. Check your PICC insertion site every day for signs of infection. Check for: Redness, swelling, or pain. Fluid or blood. Warmth. Pus or a bad smell. Preventing other problems Do not use a syringe that is less than 10 mL to flush the PICC. Do not have your blood pressure checked on the arm in which the PICC is placed. Do not ever pull or tug on the PICC. Keep it secured to your arm with tape or a stretch wrap when not in use. Do not take the PICC out yourself. Only a trained health care provider should remove the PICC. Keep pets and children away from your PICC. How to care for your PICC dressing Keep your PICC dressing clean and dry to prevent infection. Do not take baths, swim, or use a hot tub until your health care provider approves. Ask your health care provider if you can take   showers. You may only be allowed to take sponge baths. When you are allowed to shower: Ask your health care provider to teach you how to wrap the PICC. Cover the PICC with clear plastic wrap and tape to keep it dry while showering. Follow instructions from your health care provider about how to take care of your insertion site and dressing. Make sure you: Wash your hands with soap and water for at least 20 seconds before and after you change your dressing. If soap and water are not available, use hand sanitizer. Change your dressing only if taught to do so by your health care  provider. Your PICC dressing needs to be changed if it becomes loose or wet. Leave stitches (sutures), skin glue, or adhesive strips in place. These skin closures may need to stay in place for 2 weeks or longer. If adhesive strip edges start to loosen and curl up, you may trim the loose edges. Do not remove adhesive strips completely unless your health care provider tells you to do that. Follow these instructions at home: Disposal of supplies Throw away any syringes in a disposal container that is meant for sharp items (sharps container). You can buy a sharps container from a pharmacy, or you can make one by using an empty, hard plastic bottle with a lid. Place any used dressings or infusion bags into a plastic bag. Throw that bag in the trash. General instructions  Always carry your PICC identification card or wear a medical alert bracelet. Keep the tube clamped at all times, unless it is being used. Always carry a smooth-edge clamp with you to clamp the PICC if it breaks. Do not use scissors or sharp objects near the tube. You may bend your arm and move it freely. If your PICC is near or at the bend of your elbow, avoid activity with repeated motion at the elbow. Avoid lifting heavy objects as told by your health care provider. Keep all follow-up visits. This is important. You will need to have your PICC dressing changed at least once a week. Contact a health care provider if: You have pain in your arm, ear, face, or teeth. You have a fever or chills. You have redness, swelling, or pain around the insertion site. You have fluid or blood coming from the insertion site. Your insertion site feels warm to the touch. You have pus or a bad smell coming from the insertion site. Your skin feels hard and raised around the insertion site. Your PICC dressing has gotten wet or is coming off and you have not been taught how to change it. Get help right away if: You have problems with your PICC, such as  your PICC: Was tugged or pulled and has partially come out. Do not  push the PICC back in. Cannot be flushed, is hard to flush, or leaks around the insertion site when it is flushed. Makes a flushing sound when it is flushed. Appears to have a hole or tear. Is accidentally pulled all the way out. If this happens, cover the insertion site with a gauze dressing. Do not throw the PICC away. Your health care provider will need to check it to be sure the entire catheter came out. You feel your heart racing or skipping beats, or you have chest pain. You have shortness of breath or trouble breathing. You have swelling, redness, warmth, or pain in the arm in which the PICC is placed. You have a red streak going up your arm that   starts under the PICC dressing. These symptoms may be an emergency. Get help right away. Call 911. Do not wait to see if the symptoms will go away. Do not drive yourself to the hospital. Summary A peripherally inserted central catheter (PICC) is a long, thin, flexible tube (catheter) that is put into a vein in the arm or leg. If cared for properly, a PICC can remain in place for many months. Having a PICC can allow you to go home from the hospital sooner and continue treatment at home. The PICC is inserted using a germ-free (sterile) technique by a specially trained health care provider. Only a trained health care provider should remove it. Do not have your blood pressure checked on the arm in which your PICC is placed. Always keep your PICC identification card with you. This information is not intended to replace advice given to you by your health care provider. Make sure you discuss any questions you have with your health care provider. Document Revised: 10/27/2020 Document Reviewed: 10/27/2020 Elsevier Patient Education  2022 [employer].  

## 2021-06-27 ENCOUNTER — Other Ambulatory Visit: Payer: Self-pay

## 2021-06-27 ENCOUNTER — Inpatient Hospital Stay: Payer: PRIVATE HEALTH INSURANCE

## 2021-06-27 VITALS — BP 128/89 | HR 79 | Temp 98.2°F | Resp 17

## 2021-06-27 DIAGNOSIS — C180 Malignant neoplasm of cecum: Secondary | ICD-10-CM

## 2021-06-27 DIAGNOSIS — Z5111 Encounter for antineoplastic chemotherapy: Secondary | ICD-10-CM | POA: Diagnosis not present

## 2021-06-27 MED ORDER — SODIUM CHLORIDE 0.9% FLUSH
10.0000 mL | Freq: Once | INTRAVENOUS | Status: AC
  Administered 2021-06-27: 10 mL

## 2021-06-27 MED ORDER — HEPARIN SOD (PORK) LOCK FLUSH 100 UNIT/ML IV SOLN
500.0000 [IU] | Freq: Once | INTRAVENOUS | Status: AC
  Administered 2021-06-27: 250 [IU]

## 2021-06-27 NOTE — Patient Instructions (Signed)
PICC Home Care Guide A peripherally inserted central catheter (PICC) is a form of IV access that allows medicines and IV fluids to be quickly put into the blood and spread throughout the body. The PICC is a long, thin, flexible tube (catheter) that is put into a vein in a person's arm or leg. The catheter ends in a large vein just outside the heart called the superior vena cava (SVC). After the PICC is put in, a chest X-ray may be done to make sure that it is in the right place. A PICC may be placed for different reasons, such as: To give medicines and liquid nutrition. To give IV fluids and blood products. To take blood samples often. If there is trouble placing a peripheral intravenous (PIV) catheter. If cared for properly, a PICC can remain in place for many months. Having a PICC can allow you to go home from the hospital sooner and continue treatment at home. Medicines and PICC care can be managed at home by a family member, caregiver, or home health care team. What are the risks? Generally, having a PICC is safe. However, problems may occur, including: A blood clot (thrombus) forming in or at the end of the PICC. A blood clot forming in a vein (deep vein thrombosis) or traveling to the lung (pulmonary embolism). Inflammation of the vein (phlebitis) in which the PICC is placed. Infection at the insertion site or in the blood. Blood infections from central lines, like PICCs, can be serious and often require a hospital stay. PICC malposition, or PICC movement or poor placement. A break or cut in the PICC. Do not use scissors near the PICC. Nerve or tendon irritation or injury during PICC insertion. How to care for your PICC Please follow the specific guidelines provided by your health care provider. Preventing infection You and any caregivers should wash your hands often with soap and water for at least 20 seconds. Wash hands: Before touching the PICC or the infusion device. Before changing a  bandage (dressing). Do not change the dressing unless you have been taught to do so and have shown you are able to change it safely. Flush the PICC as told. Tell your health care provider right away if the PICC is hard to flush or does not flush. Do not use force to flush the PICC. Use clean and germ-free (sterile) supplies only. Keep the supplies in a dry place. Do not reuse needles, syringes, or any other supplies. Reusing supplies can lead to infection. Keep the PICC dressing dry and secure it with tape if the edges stop sticking to your skin. Check your PICC insertion site every day for signs of infection. Check for: Redness, swelling, or pain. Fluid or blood. Warmth. Pus or a bad smell. Preventing other problems Do not use a syringe that is less than 10 mL to flush the PICC. Do not have your blood pressure checked on the arm in which the PICC is placed. Do not ever pull or tug on the PICC. Keep it secured to your arm with tape or a stretch wrap when not in use. Do not take the PICC out yourself. Only a trained health care provider should remove the PICC. Keep pets and children away from your PICC. How to care for your PICC dressing Keep your PICC dressing clean and dry to prevent infection. Do not take baths, swim, or use a hot tub until your health care provider approves. Ask your health care provider if you can take   showers. You may only be allowed to take sponge baths. When you are allowed to shower: Ask your health care provider to teach you how to wrap the PICC. Cover the PICC with clear plastic wrap and tape to keep it dry while showering. Follow instructions from your health care provider about how to take care of your insertion site and dressing. Make sure you: Wash your hands with soap and water for at least 20 seconds before and after you change your dressing. If soap and water are not available, use hand sanitizer. Change your dressing only if taught to do so by your health care  provider. Your PICC dressing needs to be changed if it becomes loose or wet. Leave stitches (sutures), skin glue, or adhesive strips in place. These skin closures may need to stay in place for 2 weeks or longer. If adhesive strip edges start to loosen and curl up, you may trim the loose edges. Do not remove adhesive strips completely unless your health care provider tells you to do that. Follow these instructions at home: Disposal of supplies Throw away any syringes in a disposal container that is meant for sharp items (sharps container). You can buy a sharps container from a pharmacy, or you can make one by using an empty, hard plastic bottle with a lid. Place any used dressings or infusion bags into a plastic bag. Throw that bag in the trash. General instructions  Always carry your PICC identification card or wear a medical alert bracelet. Keep the tube clamped at all times, unless it is being used. Always carry a smooth-edge clamp with you to clamp the PICC if it breaks. Do not use scissors or sharp objects near the tube. You may bend your arm and move it freely. If your PICC is near or at the bend of your elbow, avoid activity with repeated motion at the elbow. Avoid lifting heavy objects as told by your health care provider. Keep all follow-up visits. This is important. You will need to have your PICC dressing changed at least once a week. Contact a health care provider if: You have pain in your arm, ear, face, or teeth. You have a fever or chills. You have redness, swelling, or pain around the insertion site. You have fluid or blood coming from the insertion site. Your insertion site feels warm to the touch. You have pus or a bad smell coming from the insertion site. Your skin feels hard and raised around the insertion site. Your PICC dressing has gotten wet or is coming off and you have not been taught how to change it. Get help right away if: You have problems with your PICC, such as  your PICC: Was tugged or pulled and has partially come out. Do not  push the PICC back in. Cannot be flushed, is hard to flush, or leaks around the insertion site when it is flushed. Makes a flushing sound when it is flushed. Appears to have a hole or tear. Is accidentally pulled all the way out. If this happens, cover the insertion site with a gauze dressing. Do not throw the PICC away. Your health care provider will need to check it to be sure the entire catheter came out. You feel your heart racing or skipping beats, or you have chest pain. You have shortness of breath or trouble breathing. You have swelling, redness, warmth, or pain in the arm in which the PICC is placed. You have a red streak going up your arm that   starts under the PICC dressing. These symptoms may be an emergency. Get help right away. Call 911. Do not wait to see if the symptoms will go away. Do not drive yourself to the hospital. Summary A peripherally inserted central catheter (PICC) is a long, thin, flexible tube (catheter) that is put into a vein in the arm or leg. If cared for properly, a PICC can remain in place for many months. Having a PICC can allow you to go home from the hospital sooner and continue treatment at home. The PICC is inserted using a germ-free (sterile) technique by a specially trained health care provider. Only a trained health care provider should remove it. Do not have your blood pressure checked on the arm in which your PICC is placed. Always keep your PICC identification card with you. This information is not intended to replace advice given to you by your health care provider. Make sure you discuss any questions you have with your health care provider. Document Revised: 10/27/2020 Document Reviewed: 10/27/2020 Elsevier Patient Education  2022 [employer].  

## 2021-06-29 ENCOUNTER — Inpatient Hospital Stay: Payer: PRIVATE HEALTH INSURANCE

## 2021-06-29 ENCOUNTER — Other Ambulatory Visit: Payer: Self-pay

## 2021-06-29 ENCOUNTER — Inpatient Hospital Stay (HOSPITAL_BASED_OUTPATIENT_CLINIC_OR_DEPARTMENT_OTHER): Payer: PRIVATE HEALTH INSURANCE | Admitting: Family

## 2021-06-29 ENCOUNTER — Encounter: Payer: Self-pay | Admitting: Family

## 2021-06-29 ENCOUNTER — Encounter: Payer: Self-pay | Admitting: Hematology & Oncology

## 2021-06-29 VITALS — BP 131/84 | HR 97 | Temp 97.8°F | Resp 18 | Wt 264.0 lb

## 2021-06-29 DIAGNOSIS — D509 Iron deficiency anemia, unspecified: Secondary | ICD-10-CM | POA: Diagnosis not present

## 2021-06-29 DIAGNOSIS — C180 Malignant neoplasm of cecum: Secondary | ICD-10-CM

## 2021-06-29 DIAGNOSIS — I2690 Septic pulmonary embolism without acute cor pulmonale: Secondary | ICD-10-CM | POA: Diagnosis not present

## 2021-06-29 DIAGNOSIS — Z5111 Encounter for antineoplastic chemotherapy: Secondary | ICD-10-CM | POA: Diagnosis not present

## 2021-06-29 LAB — CBC WITH DIFFERENTIAL (CANCER CENTER ONLY)
Abs Immature Granulocytes: 0.02 10*3/uL (ref 0.00–0.07)
Basophils Absolute: 0.1 10*3/uL (ref 0.0–0.1)
Basophils Relative: 2 %
Eosinophils Absolute: 0.1 10*3/uL (ref 0.0–0.5)
Eosinophils Relative: 2 %
HCT: 34.1 % — ABNORMAL LOW (ref 36.0–46.0)
Hemoglobin: 10.8 g/dL — ABNORMAL LOW (ref 12.0–15.0)
Immature Granulocytes: 1 %
Lymphocytes Relative: 42 %
Lymphs Abs: 1.7 10*3/uL (ref 0.7–4.0)
MCH: 29.3 pg (ref 26.0–34.0)
MCHC: 31.7 g/dL (ref 30.0–36.0)
MCV: 92.4 fL (ref 80.0–100.0)
Monocytes Absolute: 0.5 10*3/uL (ref 0.1–1.0)
Monocytes Relative: 11 %
Neutro Abs: 1.8 10*3/uL (ref 1.7–7.7)
Neutrophils Relative %: 42 %
Platelet Count: 320 10*3/uL (ref 150–400)
RBC: 3.69 MIL/uL — ABNORMAL LOW (ref 3.87–5.11)
RDW: 16.3 % — ABNORMAL HIGH (ref 11.5–15.5)
WBC Count: 4.1 10*3/uL (ref 4.0–10.5)
nRBC: 0 % (ref 0.0–0.2)

## 2021-06-29 LAB — IRON AND IRON BINDING CAPACITY (CC-WL,HP ONLY)
Iron: 80 ug/dL (ref 28–170)
Saturation Ratios: 30 % (ref 10.4–31.8)
TIBC: 272 ug/dL (ref 250–450)
UIBC: 192 ug/dL (ref 148–442)

## 2021-06-29 LAB — CMP (CANCER CENTER ONLY)
ALT: 19 U/L (ref 0–44)
AST: 17 U/L (ref 15–41)
Albumin: 3.4 g/dL — ABNORMAL LOW (ref 3.5–5.0)
Alkaline Phosphatase: 79 U/L (ref 38–126)
Anion gap: 8 (ref 5–15)
BUN: 9 mg/dL (ref 6–20)
CO2: 28 mmol/L (ref 22–32)
Calcium: 8.8 mg/dL — ABNORMAL LOW (ref 8.9–10.3)
Chloride: 106 mmol/L (ref 98–111)
Creatinine: 0.84 mg/dL (ref 0.44–1.00)
GFR, Estimated: >60 mL/min (ref >60)
Glucose, Bld: 109 mg/dL — ABNORMAL HIGH (ref 70–99)
Potassium: 3.3 mmol/L — ABNORMAL LOW (ref 3.5–5.1)
Sodium: 142 mmol/L (ref 135–145)
Total Bilirubin: 0.3 mg/dL (ref 0.3–1.2)
Total Protein: 5.9 g/dL — ABNORMAL LOW (ref 6.5–8.1)

## 2021-06-29 LAB — CEA (IN HOUSE-CHCC): CEA (CHCC-In House): 3.82 ng/mL (ref 0.00–5.00)

## 2021-06-29 LAB — FERRITIN: Ferritin: 382 ng/mL — ABNORMAL HIGH (ref 11–307)

## 2021-06-29 MED ORDER — SODIUM CHLORIDE 0.9% FLUSH: 10.0000 mL | Freq: Once | INTRAVENOUS | Status: DC

## 2021-06-29 MED ORDER — HEPARIN SOD (PORK) LOCK FLUSH 100 UNIT/ML IV SOLN: 250.0000 [IU] | Freq: Once | INTRAVENOUS | Status: DC

## 2021-06-29 MED ORDER — SODIUM CHLORIDE 0.9 % IV SOLN
2400.0000 mg/m2 | INTRAVENOUS | Status: DC
  Administered 2021-06-29: 5750 mg via INTRAVENOUS
  Filled 2021-06-29: qty 115

## 2021-06-29 MED ORDER — SODIUM CHLORIDE 0.9 % IV SOLN: Freq: Once | INTRAVENOUS | Status: AC

## 2021-06-29 MED ORDER — SODIUM CHLORIDE 0.9 % IV SOLN
165.0000 mg/m2 | Freq: Once | INTRAVENOUS | Status: AC
  Administered 2021-06-29: 400 mg via INTRAVENOUS
  Filled 2021-06-29: qty 20

## 2021-06-29 MED ORDER — HEPARIN SOD (PORK) LOCK FLUSH 100 UNIT/ML IV SOLN
250.0000 [IU] | Freq: Once | INTRAVENOUS | Status: DC
  Administered 2021-06-29: 250 [IU]

## 2021-06-29 MED ORDER — PALONOSETRON HCL INJECTION 0.25 MG/5ML
0.2500 mg | Freq: Once | INTRAVENOUS | Status: AC
  Administered 2021-06-29: 0.25 mg via INTRAVENOUS
  Filled 2021-06-29: qty 5

## 2021-06-29 MED ORDER — SODIUM CHLORIDE 0.9 % IV SOLN
150.0000 mg | Freq: Once | INTRAVENOUS | Status: AC
  Administered 2021-06-29: 150 mg via INTRAVENOUS
  Filled 2021-06-29: qty 150

## 2021-06-29 MED ORDER — SODIUM CHLORIDE 0.9 % IV SOLN
10.0000 mg | Freq: Once | INTRAVENOUS | Status: AC
  Administered 2021-06-29: 10 mg via INTRAVENOUS
  Filled 2021-06-29: qty 10

## 2021-06-29 MED ORDER — SODIUM CHLORIDE 0.9 % IV SOLN
400.0000 mg/m2 | Freq: Once | INTRAVENOUS | Status: AC
  Administered 2021-06-29: 956 mg via INTRAVENOUS
  Filled 2021-06-29: qty 47.8

## 2021-06-29 MED ORDER — ATROPINE SULFATE 1 MG/ML IV SOLN
0.5000 mg | Freq: Once | INTRAVENOUS | Status: AC | PRN
  Administered 2021-06-29: 0.5 mg via INTRAVENOUS
  Filled 2021-06-29: qty 1

## 2021-06-29 NOTE — Addendum Note (Signed)
Addended by: Burney Gauze R on: 06/29/2021 10:05 AM ? ? Modules accepted: Orders ? ?

## 2021-06-29 NOTE — Progress Notes (Signed)
?Hematology and Oncology Follow Up Visit ? ?Lauren Mcpherson ?[id number] ?1965/09/26 56 y.o. ?06/29/2021 ? ? 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  ?Current Therapy:        ?Status post cycle #12 of FOLFOXIRI/Avastin =-- D/C Avastin due to Pulmonary Embolism and DC Oxaliplatin due to Neuropathy ?Xarelto 20 mg p.o. daily-start on 01/22/2021 ?  ?Interim History:  Lauren Mcpherson is here today for follow-up and treatment. She is doing well.  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  ?No falls or syncope to report.  ?No swelling or tenderness in her extremities.  ?She is scheduled for her liver MRI on 07/04/2021 to evaluate response.  ?CEA last month was down to 4.29.  ?No fever, chills, n/v, cough, rash, dizziness, SOB, chest pain, palpitations, abdominal pain or changes in bowel or bladder habits.  ?No blood loss noted. No bruising or petechiae.  ?She has a good appetite despite foods tasting bland and is doing her best to stay well hydrated. Her weight is stable at  264 lbs.  ? ?ECOG Performance Status: 1 - Symptomatic but completely ambulatory ? ?Medications:  ?Allergies as of 06/29/2021   ?No Known Allergies ?  ? ?  ?Medication List  ?  ? ?  ? Accurate as of June 29, 2021  9:40 AM. If you have any questions, ask your nurse or doctor.  ?  ?  ? ?  ? ?dexamethasone 6 MG tablet ?Commonly known as: DECADRON ?Take 1 tablet (6 mg total) by mouth See admin instructions. Take 6 mg by mouth daily for 3 days, starting the day after chemotherapy. Take with food. ?  ?loperamide 2 MG tablet ?Commonly known as: Imodium A-D ?Take 2 at onset of diarrhea, then 1 every 2hrs until 12hr without a BM. May take 2 tab every 4hrs at bedtime. If diarrhea recurs repeat. ?  ?ondansetron 8 MG tablet ?Commonly known as: Zofran ?Take 1 tablet (8 mg total) by mouth 2 (two) times daily as needed.  Start on day 3 after chemotherapy. ?  ?prochlorperazine 10 MG tablet ?Commonly known as: COMPAZINE ?Take 1 tablet (10 mg total) by mouth every 6 (six) hours as needed (Nausea or vomiting). ?  ?rivaroxaban 20 MG Tabs tablet ?Commonly known as: XARELTO ?Take 1 tablet (20 mg total) by mouth daily with supper. ?  ? ?  ? ? ?Allergies: No Known Allergies ? ?Past Medical History, Surgical history, Social history, and Family History were reviewed and updated. ? ?Review of Systems: ?All other 10 point review of systems is negative.  ? ?Physical Exam: ? weight is 264 lb (119.7 kg). Her oral temperature is 97.8 ?F (36.6 ?C). Her blood pressure is 131/84 and her pulse is 97. Her respiration is 18 and oxygen saturation is 100%.  ? ?Wt Readings from Last 3 Encounters:  ?06/29/21 264 lb (119.7 kg)  ?06/14/21 264 lb (119.7 kg)  ?06/14/21 264 lb (119.7 kg)  ? ? ?Ocular: Sclerae unicteric, pupils equal, round and reactive to light ?Ear-nose-throat: Oropharynx clear, dentition fair ?Lymphatic: No cervical or supraclavicular adenopathy ?Lungs no rales or rhonchi, good excursion bilaterally ?Heart regular rate and rhythm, no murmur appreciated ?Abd soft, nontender, positive bowel sounds ?MSK no focal spinal tenderness, no joint edema ?Neuro: non-focal, well-oriented, appropriate affect ?Breasts: Deferred  ? ?Lab Results  ?Component Value Date  ? WBC 4.7 06/14/2021  ?  HGB 10.6 (L) 06/14/2021  ? HCT 32.9 (L) 06/14/2021  ? MCV 92.7 06/14/2021  ? PLT 268 06/14/2021  ? ?Lab Results  ?Component Value Date  ? FERRITIN 355 (H) 06/14/2021  ? IRON 63 06/14/2021  ? TIBC 272 06/14/2021  ? UIBC 209 06/14/2021  ? IRONPCTSAT 23 06/14/2021  ? ?Lab Results  ?Component Value Date  ? RETICCTPCT 3.0 01/04/2021  ? RBC 3.55 (L) 06/14/2021  ? ?No results found for: KPAFRELGTCHN, LAMBDASER, KAPLAMBRATIO ?No results found for: IGGSERUM, IGA, IGMSERUM ?No results found for: TOTALPROTELP, ALBUMINELP, A1GS, A2GS, BETS, BETA2SER, GAMS, MSPIKE, SPEI ?  Chemistry   ?    ?Component Value Date/Time  ? NA 142 06/14/2021 0820  ? K 3.1 (L) 06/14/2021 0820  ? CL 106 06/14/2021 0820  ? CO2 29 06/14/2021 0820  ? BUN 7 06/14/2021 0820  ? CREATININE 0.70 06/14/2021 0820  ?    ?Component Value Date/Time  ? CALCIUM 8.6 (L) 06/14/2021 0820  ? ALKPHOS 91 06/14/2021 0820  ? AST 20 06/14/2021 0820  ? ALT 23 06/14/2021 0820  ? BILITOT 0.3 06/14/2021 0820  ?  ? ? ? ?Impression and Plan: Lauren Mcpherson is a very pleasant 56 yo African American female with metastatic 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  ?MRI scheduled for 07/04/2021.  ?We will proceed with treatment today as planned.  ?I spoke with Dr. Marin Olp about her increased neuropathy with taking Xarelto and we will have her try taking it earlier in the day. If this does not help we will look at reducing her dose.  ?Follow-up in 2 weeks.  ? ?Lottie Dawson, NP ?3/8/20239:40 AM ? ?

## 2021-06-29 NOTE — Patient Instructions (Signed)
PICC Home Care Guide A peripherally inserted central catheter (PICC) is a form of IV access that allows medicines and IV fluids to be quickly put into the blood and spread throughout the body. The PICC is a long, thin, flexible tube (catheter) that is put into a vein in a person's arm or leg. The catheter ends in a large vein just outside the heart called the superior vena cava (SVC). After the PICC is put in, a chest X-ray may be done to make sure that it is in the right place. A PICC may be placed for different reasons, such as: To give medicines and liquid nutrition. To give IV fluids and blood products. To take blood samples often. If there is trouble placing a peripheral intravenous (PIV) catheter. If cared for properly, a PICC can remain in place for many months. Having a PICC can allow you to go home from the hospital sooner and continue treatment at home. Medicines and PICC care can be managed at home by a family member, caregiver, or home health care team. What are the risks? Generally, having a PICC is safe. However, problems may occur, including: A blood clot (thrombus) forming in or at the end of the PICC. A blood clot forming in a vein (deep vein thrombosis) or traveling to the lung (pulmonary embolism). Inflammation of the vein (phlebitis) in which the PICC is placed. Infection at the insertion site or in the blood. Blood infections from central lines, like PICCs, can be serious and often require a hospital stay. PICC malposition, or PICC movement or poor placement. A break or cut in the PICC. Do not use scissors near the PICC. Nerve or tendon irritation or injury during PICC insertion. How to care for your PICC Please follow the specific guidelines provided by your health care provider. Preventing infection You and any caregivers should wash your hands often with soap and water for at least 20 seconds. Wash hands: Before touching the PICC or the infusion device. Before changing a  bandage (dressing). Do not change the dressing unless you have been taught to do so and have shown you are able to change it safely. Flush the PICC as told. Tell your health care provider right away if the PICC is hard to flush or does not flush. Do not use force to flush the PICC. Use clean and germ-free (sterile) supplies only. Keep the supplies in a dry place. Do not reuse needles, syringes, or any other supplies. Reusing supplies can lead to infection. Keep the PICC dressing dry and secure it with tape if the edges stop sticking to your skin. Check your PICC insertion site every day for signs of infection. Check for: Redness, swelling, or pain. Fluid or blood. Warmth. Pus or a bad smell. Preventing other problems Do not use a syringe that is less than 10 mL to flush the PICC. Do not have your blood pressure checked on the arm in which the PICC is placed. Do not ever pull or tug on the PICC. Keep it secured to your arm with tape or a stretch wrap when not in use. Do not take the PICC out yourself. Only a trained health care provider should remove the PICC. Keep pets and children away from your PICC. How to care for your PICC dressing Keep your PICC dressing clean and dry to prevent infection. Do not take baths, swim, or use a hot tub until your health care provider approves. Ask your health care provider if you can take   showers. You may only be allowed to take sponge baths. When you are allowed to shower: Ask your health care provider to teach you how to wrap the PICC. Cover the PICC with clear plastic wrap and tape to keep it dry while showering. Follow instructions from your health care provider about how to take care of your insertion site and dressing. Make sure you: Wash your hands with soap and water for at least 20 seconds before and after you change your dressing. If soap and water are not available, use hand sanitizer. Change your dressing only if taught to do so by your health care  provider. Your PICC dressing needs to be changed if it becomes loose or wet. Leave stitches (sutures), skin glue, or adhesive strips in place. These skin closures may need to stay in place for 2 weeks or longer. If adhesive strip edges start to loosen and curl up, you may trim the loose edges. Do not remove adhesive strips completely unless your health care provider tells you to do that. Follow these instructions at home: Disposal of supplies Throw away any syringes in a disposal container that is meant for sharp items (sharps container). You can buy a sharps container from a pharmacy, or you can make one by using an empty, hard plastic bottle with a lid. Place any used dressings or infusion bags into a plastic bag. Throw that bag in the trash. General instructions  Always carry your PICC identification card or wear a medical alert bracelet. Keep the tube clamped at all times, unless it is being used. Always carry a smooth-edge clamp with you to clamp the PICC if it breaks. Do not use scissors or sharp objects near the tube. You may bend your arm and move it freely. If your PICC is near or at the bend of your elbow, avoid activity with repeated motion at the elbow. Avoid lifting heavy objects as told by your health care provider. Keep all follow-up visits. This is important. You will need to have your PICC dressing changed at least once a week. Contact a health care provider if: You have pain in your arm, ear, face, or teeth. You have a fever or chills. You have redness, swelling, or pain around the insertion site. You have fluid or blood coming from the insertion site. Your insertion site feels warm to the touch. You have pus or a bad smell coming from the insertion site. Your skin feels hard and raised around the insertion site. Your PICC dressing has gotten wet or is coming off and you have not been taught how to change it. Get help right away if: You have problems with your PICC, such as  your PICC: Was tugged or pulled and has partially come out. Do not  push the PICC back in. Cannot be flushed, is hard to flush, or leaks around the insertion site when it is flushed. Makes a flushing sound when it is flushed. Appears to have a hole or tear. Is accidentally pulled all the way out. If this happens, cover the insertion site with a gauze dressing. Do not throw the PICC away. Your health care provider will need to check it to be sure the entire catheter came out. You feel your heart racing or skipping beats, or you have chest pain. You have shortness of breath or trouble breathing. You have swelling, redness, warmth, or pain in the arm in which the PICC is placed. You have a red streak going up your arm that   starts under the PICC dressing. These symptoms may be an emergency. Get help right away. Call 911. Do not wait to see if the symptoms will go away. Do not drive yourself to the hospital. Summary A peripherally inserted central catheter (PICC) is a long, thin, flexible tube (catheter) that is put into a vein in the arm or leg. If cared for properly, a PICC can remain in place for many months. Having a PICC can allow you to go home from the hospital sooner and continue treatment at home. The PICC is inserted using a germ-free (sterile) technique by a specially trained health care provider. Only a trained health care provider should remove it. Do not have your blood pressure checked on the arm in which your PICC is placed. Always keep your PICC identification card with you. This information is not intended to replace advice given to you by your health care provider. Make sure you discuss any questions you have with your health care provider. Document Revised: 10/27/2020 Document Reviewed: 10/27/2020 Elsevier Patient Education  2022 [employer].  

## 2021-06-29 NOTE — Patient Instructions (Signed)
[facility] Mcpherson [facility]  Discharge Instructions: ?Thank you for choosing [facility] to provide your oncology and hematology care.  ? ?If you have a lab appointment with the [facility], please go directly to the [facility] and check in at the registration area. ? ?Wear comfortable clothing and clothing appropriate for easy access to any Portacath or PICC line.  ? ?We strive to give you quality time with your provider. You may need to reschedule your appointment if you arrive late (15 or more minutes).  Arriving late affects you and other patients whose appointments are after yours.  Also, if you miss three or more appointments without notifying the office, you may be dismissed from the clinic at the provider?s discretion.    ?  ?For prescription refill requests, have your pharmacy contact our office and allow 72 hours for refills to be completed.   ? ?Today you received the following chemotherapy and/or immunotherapy agents Irinotecan, Leucovorin and 5FU ?  ?To help prevent nausea and vomiting after your treatment, we encourage you to take your nausea medication as directed. ? ?BELOW ARE SYMPTOMS THAT SHOULD BE REPORTED IMMEDIATELY: ?*FEVER GREATER THAN 100.4 F (38 ?C) OR HIGHER ?*CHILLS OR SWEATING ?*NAUSEA AND VOMITING THAT IS NOT CONTROLLED WITH YOUR NAUSEA MEDICATION ?*UNUSUAL SHORTNESS OF BREATH ?*UNUSUAL BRUISING OR BLEEDING ?*URINARY PROBLEMS (pain or burning when urinating, or frequent urination) ?*BOWEL PROBLEMS (unusual diarrhea, constipation, pain near the anus) ?TENDERNESS IN MOUTH AND THROAT WITH OR WITHOUT PRESENCE OF ULCERS (sore throat, sores in mouth, or a toothache) ?UNUSUAL RASH, SWELLING OR PAIN  ?UNUSUAL VAGINAL DISCHARGE OR ITCHING  ? ?Items with * indicate a potential emergency and should be followed up as soon as possible or go to the Emergency Department if any problems should occur. ? ?Please show the CHEMOTHERAPY ALERT CARD or IMMUNOTHERAPY ALERT CARD at  check-in to the Emergency Department and triage nurse. ?Should you have questions after your visit or need to cancel or reschedule your appointment, please contact [facility]  [phone] and follow the prompts.  Office hours are 8:00 a.m. to 4:30 p.m. Monday - Friday. Please note that voicemails left after 4:00 p.m. may not be returned until the following business day.  We are closed weekends and major holidays. You have access to a nurse at all times for urgent questions. Please call the main number to the clinic [phone] and follow the prompts. ? ?For any non-urgent questions, you may also contact your provider using MyChart. We now offer e-Visits for anyone 76 and older to request care online for non-urgent symptoms. For details visit [email or web address]. ?  ?Also download the MyChart app! Go to the app store, search "MyChart", open the app, select [facility], and log in with your MyChart username and password. ? ?Due to Covid, a mask is required upon entering the hospital/clinic. If you do not have a mask, one will be given to you upon arrival. For doctor visits, patients may have 1 support person aged 47 or older with them. For treatment visits, patients cannot have anyone with them due to current Covid guidelines and our immunocompromised population.  ?

## 2021-06-30 ENCOUNTER — Encounter: Payer: Self-pay | Admitting: Family

## 2021-06-30 ENCOUNTER — Encounter: Payer: Self-pay | Admitting: Hematology & Oncology

## 2021-07-01 ENCOUNTER — Other Ambulatory Visit: Payer: Self-pay

## 2021-07-01 ENCOUNTER — Inpatient Hospital Stay: Payer: PRIVATE HEALTH INSURANCE

## 2021-07-01 VITALS — BP 124/70 | HR 74 | Temp 97.8°F | Resp 18

## 2021-07-01 DIAGNOSIS — Z5111 Encounter for antineoplastic chemotherapy: Secondary | ICD-10-CM | POA: Diagnosis not present

## 2021-07-01 DIAGNOSIS — C180 Malignant neoplasm of cecum: Secondary | ICD-10-CM

## 2021-07-01 MED ORDER — PEGFILGRASTIM INJECTION 6 MG/0.6ML ~~LOC~~
6.0000 mg | PREFILLED_SYRINGE | Freq: Once | SUBCUTANEOUS | Status: AC
  Administered 2021-07-01: 6 mg via SUBCUTANEOUS
  Filled 2021-07-01: qty 0.6

## 2021-07-01 MED ORDER — HEPARIN SOD (PORK) LOCK FLUSH 100 UNIT/ML IV SOLN
500.0000 [IU] | Freq: Once | INTRAVENOUS | Status: AC | PRN
  Administered 2021-07-01: 250 [IU]

## 2021-07-01 MED ORDER — SODIUM CHLORIDE 0.9% FLUSH
10.0000 mL | INTRAVENOUS | Status: DC | PRN
  Administered 2021-07-01: 10 mL

## 2021-07-01 NOTE — Patient Instructions (Signed)

## 2021-07-04 ENCOUNTER — Ambulatory Visit (HOSPITAL_COMMUNITY)
Admission: RE | Admit: 2021-07-04 | Discharge: 2021-07-04 | Disposition: A | Discharge status: Home / Self Care | Payer: PRIVATE HEALTH INSURANCE | Source: Ambulatory Visit | Attending: Hematology & Oncology | Admitting: Hematology & Oncology

## 2021-07-04 ENCOUNTER — Telehealth: Payer: Self-pay

## 2021-07-04 ENCOUNTER — Other Ambulatory Visit: Payer: Self-pay

## 2021-07-04 ENCOUNTER — Inpatient Hospital Stay: Payer: PRIVATE HEALTH INSURANCE

## 2021-07-04 DIAGNOSIS — C180 Malignant neoplasm of cecum: Secondary | ICD-10-CM

## 2021-07-04 MED ORDER — GADOBUTROL 1 MMOL/ML IV SOLN
10.0000 mL | Freq: Once | INTRAVENOUS | Status: AC | PRN
  Administered 2021-07-04: 10 mL via INTRAVENOUS

## 2021-07-04 MED ORDER — SODIUM CHLORIDE 0.9% FLUSH
10.0000 mL | Freq: Once | INTRAVENOUS | Status: AC
  Administered 2021-07-04: 10 mL

## 2021-07-04 MED ORDER — HEPARIN SOD (PORK) LOCK FLUSH 100 UNIT/ML IV SOLN
250.0000 [IU] | Freq: Once | INTRAVENOUS | Status: AC
  Administered 2021-07-04: 250 [IU]

## 2021-07-04 NOTE — Patient Instructions (Signed)
PICC Home Care Guide A peripherally inserted central catheter (PICC) is a form of IV access that allows medicines and IV fluids to be quickly put into the blood and spread throughout the body. The PICC is a long, thin, flexible tube (catheter) that is put into a vein in a person's arm or leg. The catheter ends in a large vein just outside the heart called the superior vena cava (SVC). After the PICC is put in, a chest X-ray may be done to make sure that it is in the right place. A PICC may be placed for different reasons, such as: To give medicines and liquid nutrition. To give IV fluids and blood products. To take blood samples often. If there is trouble placing a peripheral intravenous (PIV) catheter. If cared for properly, a PICC can remain in place for many months. Having a PICC can allow you to go home from the hospital sooner and continue treatment at home. Medicines and PICC care can be managed at home by a family member, caregiver, or home health care team. What are the risks? Generally, having a PICC is safe. However, problems may occur, including: A blood clot (thrombus) forming in or at the end of the PICC. A blood clot forming in a vein (deep vein thrombosis) or traveling to the lung (pulmonary embolism). Inflammation of the vein (phlebitis) in which the PICC is placed. Infection at the insertion site or in the blood. Blood infections from central lines, like PICCs, can be serious and often require a hospital stay. PICC malposition, or PICC movement or poor placement. A break or cut in the PICC. Do not use scissors near the PICC. Nerve or tendon irritation or injury during PICC insertion. How to care for your PICC Please follow the specific guidelines provided by your health care provider. Preventing infection You and any caregivers should wash your hands often with soap and water for at least 20 seconds. Wash hands: Before touching the PICC or the infusion device. Before changing a  bandage (dressing). Do not change the dressing unless you have been taught to do so and have shown you are able to change it safely. Flush the PICC as told. Tell your health care provider right away if the PICC is hard to flush or does not flush. Do not use force to flush the PICC. Use clean and germ-free (sterile) supplies only. Keep the supplies in a dry place. Do not reuse needles, syringes, or any other supplies. Reusing supplies can lead to infection. Keep the PICC dressing dry and secure it with tape if the edges stop sticking to your skin. Check your PICC insertion site every day for signs of infection. Check for: Redness, swelling, or pain. Fluid or blood. Warmth. Pus or a bad smell. Preventing other problems Do not use a syringe that is less than 10 mL to flush the PICC. Do not have your blood pressure checked on the arm in which the PICC is placed. Do not ever pull or tug on the PICC. Keep it secured to your arm with tape or a stretch wrap when not in use. Do not take the PICC out yourself. Only a trained health care provider should remove the PICC. Keep pets and children away from your PICC. How to care for your PICC dressing Keep your PICC dressing clean and dry to prevent infection. Do not take baths, swim, or use a hot tub until your health care provider approves. Ask your health care provider if you can take   showers. You may only be allowed to take sponge baths. When you are allowed to shower: Ask your health care provider to teach you how to wrap the PICC. Cover the PICC with clear plastic wrap and tape to keep it dry while showering. Follow instructions from your health care provider about how to take care of your insertion site and dressing. Make sure you: Wash your hands with soap and water for at least 20 seconds before and after you change your dressing. If soap and water are not available, use hand sanitizer. Change your dressing only if taught to do so by your health care  provider. Your PICC dressing needs to be changed if it becomes loose or wet. Leave stitches (sutures), skin glue, or adhesive strips in place. These skin closures may need to stay in place for 2 weeks or longer. If adhesive strip edges start to loosen and curl up, you may trim the loose edges. Do not remove adhesive strips completely unless your health care provider tells you to do that. Follow these instructions at home: Disposal of supplies Throw away any syringes in a disposal container that is meant for sharp items (sharps container). You can buy a sharps container from a pharmacy, or you can make one by using an empty, hard plastic bottle with a lid. Place any used dressings or infusion bags into a plastic bag. Throw that bag in the trash. General instructions  Always carry your PICC identification card or wear a medical alert bracelet. Keep the tube clamped at all times, unless it is being used. Always carry a smooth-edge clamp with you to clamp the PICC if it breaks. Do not use scissors or sharp objects near the tube. You may bend your arm and move it freely. If your PICC is near or at the bend of your elbow, avoid activity with repeated motion at the elbow. Avoid lifting heavy objects as told by your health care provider. Keep all follow-up visits. This is important. You will need to have your PICC dressing changed at least once a week. Contact a health care provider if: You have pain in your arm, ear, face, or teeth. You have a fever or chills. You have redness, swelling, or pain around the insertion site. You have fluid or blood coming from the insertion site. Your insertion site feels warm to the touch. You have pus or a bad smell coming from the insertion site. Your skin feels hard and raised around the insertion site. Your PICC dressing has gotten wet or is coming off and you have not been taught how to change it. Get help right away if: You have problems with your PICC, such as  your PICC: Was tugged or pulled and has partially come out. Do not  push the PICC back in. Cannot be flushed, is hard to flush, or leaks around the insertion site when it is flushed. Makes a flushing sound when it is flushed. Appears to have a hole or tear. Is accidentally pulled all the way out. If this happens, cover the insertion site with a gauze dressing. Do not throw the PICC away. Your health care provider will need to check it to be sure the entire catheter came out. You feel your heart racing or skipping beats, or you have chest pain. You have shortness of breath or trouble breathing. You have swelling, redness, warmth, or pain in the arm in which the PICC is placed. You have a red streak going up your arm that   starts under the PICC dressing. These symptoms may be an emergency. Get help right away. Call 911. Do not wait to see if the symptoms will go away. Do not drive yourself to the hospital. Summary A peripherally inserted central catheter (PICC) is a long, thin, flexible tube (catheter) that is put into a vein in the arm or leg. If cared for properly, a PICC can remain in place for many months. Having a PICC can allow you to go home from the hospital sooner and continue treatment at home. The PICC is inserted using a germ-free (sterile) technique by a specially trained health care provider. Only a trained health care provider should remove it. Do not have your blood pressure checked on the arm in which your PICC is placed. Always keep your PICC identification card with you. This information is not intended to replace advice given to you by your health care provider. Make sure you discuss any questions you have with your health care provider. Document Revised: 10/27/2020 Document Reviewed: 10/27/2020 Elsevier Patient Education  2022 [employer].  

## 2021-07-04 NOTE — Telephone Encounter (Signed)
?   Lauren Mcpherson ?DOB: Sep 30, 1965 ?MRN: [id number]  ? ?RIDER WAIVER AND RELEASE OF LIABILITY ? ?For purposes of improving physical access to our facilities, [facility] is pleased to partner with third parties to provide [facility] patients or other authorized individuals the option of convenient, on-demand ground transportation services (the ?[employer]?) through use of the technology service that enables users to request on-demand ground transportation from independent third-party providers. ? ?By opting to use and accept these [employer], I, the undersigned, hereby agree on behalf of myself, and on behalf of any minor child using the Government social research officer for whom I am the parent or legal guardian, as follows: ? ?Government social research officer provided to me are provided by independent third-party transportation providers who are not [employer] or employees and who are unaffiliated with [employer]. ?[facility] is neither a transportation carrier nor a common or public carrier. ?[facility] has no control over the quality or safety of the transportation that occurs as a result of the [employer]. ?[facility] cannot guarantee that any third-party transportation provider will complete any arranged transportation service. ?[facility] makes no representation, warranty, or guarantee regarding the reliability, timeliness, quality, safety, suitability, or availability of any of the Transport Services or that they will be error free. ?I fully understand that traveling by vehicle involves risks and dangers of serious bodily injury, including permanent disability, paralysis, and death. I agree, on behalf of myself and on behalf of any minor child using the Transport Services for whom I am the parent or legal guardian, that the entire risk arising out of my use of the [employer] remains solely with me, to the maximum extent permitted under applicable law. ?The [employer] are provided ?as  is? and ?as available.? [facility] disclaims all representations and warranties, express, implied or statutory, not expressly set out in these terms, including the implied warranties of merchantability and fitness for a particular purpose. ?I hereby waive and release [facility], its agents, employees, officers, directors, representatives, insurers, attorneys, assigns, successors, subsidiaries, and affiliates from any and all past, present, or future claims, demands, liabilities, actions, causes of action, or suits of any kind directly or indirectly arising from acceptance and use of the [employer]. ?I further waive and release [facility] and its affiliates from all present and future liability and responsibility for any injury or death to persons or damages to property caused by or related to the use of the [employer]. ?I have read this Waiver and Release of Liability, and I understand the terms used in it and their legal significance. This Waiver is freely and voluntarily given with the understanding that my right (as well as the right of any minor child for whom I am the parent or legal guardian using the [employer]) to legal recourse against  in connection with the [employer] is knowingly surrendered in return for use of these services. ? ? ?I attest that I read the consent document to Lauren Mcpherson, gave Ms. Suder the opportunity to ask questions and answered the questions asked (if any). I affirm that Lauren Mcpherson then provided consent for she's participation in this program.   ?  ?Lauren Mcpherson ? ?                          ?

## 2021-07-04 NOTE — Telephone Encounter (Signed)
Called and informed patient of results, patient verbalized understanding and denies any questions or concerns at this time.  ? ?

## 2021-07-04 NOTE — Telephone Encounter (Signed)
-----   Message from [name], MD sent at 07/04/2021  2:08 PM EDT ----- ?Call - the liver lesions are stable!!!  This is good news!!  [name] ?

## 2021-07-06 ENCOUNTER — Other Ambulatory Visit: Payer: Self-pay

## 2021-07-06 ENCOUNTER — Inpatient Hospital Stay: Payer: PRIVATE HEALTH INSURANCE

## 2021-07-06 VITALS — BP 113/68 | HR 93 | Temp 98.0°F | Resp 18

## 2021-07-06 DIAGNOSIS — C180 Malignant neoplasm of cecum: Secondary | ICD-10-CM

## 2021-07-06 DIAGNOSIS — Z5111 Encounter for antineoplastic chemotherapy: Secondary | ICD-10-CM | POA: Diagnosis not present

## 2021-07-06 MED ORDER — HEPARIN SOD (PORK) LOCK FLUSH 100 UNIT/ML IV SOLN
500.0000 [IU] | Freq: Once | INTRAVENOUS | Status: AC
  Administered 2021-07-06: 500 [IU] via INTRAVENOUS

## 2021-07-06 MED ORDER — SODIUM CHLORIDE 0.9% FLUSH
10.0000 mL | INTRAVENOUS | Status: DC | PRN
  Administered 2021-07-06: 10 mL via INTRAVENOUS

## 2021-07-06 NOTE — Patient Instructions (Signed)
Lauren Mcpherson  Discharge Instructions: ?Thank you for choosing [facility] to provide your oncology and hematology care.  ? ?If you have a lab appointment with the [facility], please go directly to the [facility] and check in at the registration area. ? ?Wear comfortable clothing and clothing appropriate for easy access to any Portacath or PICC line.  ? ?We strive to give you quality time with your provider. You may need to reschedule your appointment if you arrive late (15 or more minutes).  Arriving late affects you and other patients whose appointments are after yours.  Also, if you miss three or more appointments without notifying the office, you may be dismissed from the clinic at the provider?s discretion.    ?  ?For prescription refill requests, have your pharmacy contact our office and allow 72 hours for refills to be completed.   ? ?Today you received the following chemotherapy and/or immunotherapy agents picc line flush ?  ?To help prevent nausea and vomiting after your treatment, we encourage you to take your nausea medication as directed. ? ?BELOW ARE SYMPTOMS THAT SHOULD BE REPORTED IMMEDIATELY: ?*FEVER GREATER THAN 100.4 F (38 ?C) OR HIGHER ?*CHILLS OR SWEATING ?*NAUSEA AND VOMITING THAT IS NOT CONTROLLED WITH YOUR NAUSEA MEDICATION ?*UNUSUAL SHORTNESS OF BREATH ?*UNUSUAL BRUISING OR BLEEDING ?*URINARY PROBLEMS (pain or burning when urinating, or frequent urination) ?*BOWEL PROBLEMS (unusual diarrhea, constipation, pain near the anus) ?TENDERNESS IN MOUTH AND THROAT WITH OR WITHOUT PRESENCE OF ULCERS (sore throat, sores in mouth, or a toothache) ?UNUSUAL RASH, SWELLING OR PAIN  ?UNUSUAL VAGINAL DISCHARGE OR ITCHING  ? ?Items with * indicate a potential emergency and should be followed up as soon as possible or go to the Emergency Department if any problems should occur. ? ?Please show the CHEMOTHERAPY ALERT CARD or IMMUNOTHERAPY ALERT CARD at check-in to  the Emergency Department and triage nurse. ?Should you have questions after your visit or need to cancel or reschedule your appointment, please contact [facility]  [phone] and follow the prompts.  Office hours are 8:00 a.m. to 4:30 p.m. Monday - Friday. Please note that voicemails left after 4:00 p.m. may not be returned until the following business day.  We are closed weekends and major holidays. You have access to a nurse at all times for urgent questions. Please call the main number to the clinic [phone] and follow the prompts. ? ?For any non-urgent questions, you may also contact your provider using MyChart. We now offer e-Visits for anyone 13 and older to request care online for non-urgent symptoms. For details visit [email or web address]. ?  ?Also download the MyChart app! Go to the app store, search "MyChart", open the app, select [facility], and log in with your MyChart username and password. ? ?Due to Covid, a mask is required upon entering the hospital/clinic. If you do not have a mask, one will be given to you upon arrival. For doctor visits, patients may have 1 support person aged 72 or older with them. For treatment visits, patients cannot have anyone with them due to current Covid guidelines and our immunocompromised population.  ?

## 2021-07-08 ENCOUNTER — Other Ambulatory Visit: Payer: Self-pay

## 2021-07-08 ENCOUNTER — Inpatient Hospital Stay: Payer: PRIVATE HEALTH INSURANCE

## 2021-07-08 VITALS — BP 132/95 | HR 101 | Temp 98.4°F | Resp 18

## 2021-07-08 DIAGNOSIS — C180 Malignant neoplasm of cecum: Secondary | ICD-10-CM

## 2021-07-08 DIAGNOSIS — Z5111 Encounter for antineoplastic chemotherapy: Secondary | ICD-10-CM | POA: Diagnosis not present

## 2021-07-08 MED ORDER — SODIUM CHLORIDE 0.9% FLUSH
10.0000 mL | Freq: Once | INTRAVENOUS | Status: AC
  Administered 2021-07-08: 10 mL

## 2021-07-08 MED ORDER — HEPARIN SOD (PORK) LOCK FLUSH 100 UNIT/ML IV SOLN
250.0000 [IU] | Freq: Once | INTRAVENOUS | Status: AC
  Administered 2021-07-08: 250 [IU]

## 2021-07-08 NOTE — Patient Instructions (Signed)
PICC Home Care Guide A peripherally inserted central catheter (PICC) is a form of IV access that allows medicines and IV fluids to be quickly put into the blood and spread throughout the body. The PICC is a long, thin, flexible tube (catheter) that is put into a vein in a person's arm or leg. The catheter ends in a large vein just outside the heart called the superior vena cava (SVC). After the PICC is put in, a chest X-ray may be done to make sure that it is in the right place. A PICC may be placed for different reasons, such as: To give medicines and liquid nutrition. To give IV fluids and blood products. To take blood samples often. If there is trouble placing a peripheral intravenous (PIV) catheter. If cared for properly, a PICC can remain in place for many months. Having a PICC can allow you to go home from the hospital sooner and continue treatment at home. Medicines and PICC care can be managed at home by a family member, caregiver, or home health care team. What are the risks? Generally, having a PICC is safe. However, problems may occur, including: A blood clot (thrombus) forming in or at the end of the PICC. A blood clot forming in a vein (deep vein thrombosis) or traveling to the lung (pulmonary embolism). Inflammation of the vein (phlebitis) in which the PICC is placed. Infection at the insertion site or in the blood. Blood infections from central lines, like PICCs, can be serious and often require a hospital stay. PICC malposition, or PICC movement or poor placement. A break or cut in the PICC. Do not use scissors near the PICC. Nerve or tendon irritation or injury during PICC insertion. How to care for your PICC Please follow the specific guidelines provided by your health care provider. Preventing infection You and any caregivers should wash your hands often with soap and water for at least 20 seconds. Wash hands: Before touching the PICC or the infusion device. Before changing a  bandage (dressing). Do not change the dressing unless you have been taught to do so and have shown you are able to change it safely. Flush the PICC as told. Tell your health care provider right away if the PICC is hard to flush or does not flush. Do not use force to flush the PICC. Use clean and germ-free (sterile) supplies only. Keep the supplies in a dry place. Do not reuse needles, syringes, or any other supplies. Reusing supplies can lead to infection. Keep the PICC dressing dry and secure it with tape if the edges stop sticking to your skin. Check your PICC insertion site every day for signs of infection. Check for: Redness, swelling, or pain. Fluid or blood. Warmth. Pus or a bad smell. Preventing other problems Do not use a syringe that is less than 10 mL to flush the PICC. Do not have your blood pressure checked on the arm in which the PICC is placed. Do not ever pull or tug on the PICC. Keep it secured to your arm with tape or a stretch wrap when not in use. Do not take the PICC out yourself. Only a trained health care provider should remove the PICC. Keep pets and children away from your PICC. How to care for your PICC dressing Keep your PICC dressing clean and dry to prevent infection. Do not take baths, swim, or use a hot tub until your health care provider approves. Ask your health care provider if you can take   showers. You may only be allowed to take sponge baths. When you are allowed to shower: Ask your health care provider to teach you how to wrap the PICC. Cover the PICC with clear plastic wrap and tape to keep it dry while showering. Follow instructions from your health care provider about how to take care of your insertion site and dressing. Make sure you: Wash your hands with soap and water for at least 20 seconds before and after you change your dressing. If soap and water are not available, use hand sanitizer. Change your dressing only if taught to do so by your health care  provider. Your PICC dressing needs to be changed if it becomes loose or wet. Leave stitches (sutures), skin glue, or adhesive strips in place. These skin closures may need to stay in place for 2 weeks or longer. If adhesive strip edges start to loosen and curl up, you may trim the loose edges. Do not remove adhesive strips completely unless your health care provider tells you to do that. Follow these instructions at home: Disposal of supplies Throw away any syringes in a disposal container that is meant for sharp items (sharps container). You can buy a sharps container from a pharmacy, or you can make one by using an empty, hard plastic bottle with a lid. Place any used dressings or infusion bags into a plastic bag. Throw that bag in the trash. General instructions  Always carry your PICC identification card or wear a medical alert bracelet. Keep the tube clamped at all times, unless it is being used. Always carry a smooth-edge clamp with you to clamp the PICC if it breaks. Do not use scissors or sharp objects near the tube. You may bend your arm and move it freely. If your PICC is near or at the bend of your elbow, avoid activity with repeated motion at the elbow. Avoid lifting heavy objects as told by your health care provider. Keep all follow-up visits. This is important. You will need to have your PICC dressing changed at least once a week. Contact a health care provider if: You have pain in your arm, ear, face, or teeth. You have a fever or chills. You have redness, swelling, or pain around the insertion site. You have fluid or blood coming from the insertion site. Your insertion site feels warm to the touch. You have pus or a bad smell coming from the insertion site. Your skin feels hard and raised around the insertion site. Your PICC dressing has gotten wet or is coming off and you have not been taught how to change it. Get help right away if: You have problems with your PICC, such as  your PICC: Was tugged or pulled and has partially come out. Do not  push the PICC back in. Cannot be flushed, is hard to flush, or leaks around the insertion site when it is flushed. Makes a flushing sound when it is flushed. Appears to have a hole or tear. Is accidentally pulled all the way out. If this happens, cover the insertion site with a gauze dressing. Do not throw the PICC away. Your health care provider will need to check it to be sure the entire catheter came out. You feel your heart racing or skipping beats, or you have chest pain. You have shortness of breath or trouble breathing. You have swelling, redness, warmth, or pain in the arm in which the PICC is placed. You have a red streak going up your arm that   starts under the PICC dressing. These symptoms may be an emergency. Get help right away. Call 911. Do not wait to see if the symptoms will go away. Do not drive yourself to the hospital. Summary A peripherally inserted central catheter (PICC) is a long, thin, flexible tube (catheter) that is put into a vein in the arm or leg. If cared for properly, a PICC can remain in place for many months. Having a PICC can allow you to go home from the hospital sooner and continue treatment at home. The PICC is inserted using a germ-free (sterile) technique by a specially trained health care provider. Only a trained health care provider should remove it. Do not have your blood pressure checked on the arm in which your PICC is placed. Always keep your PICC identification card with you. This information is not intended to replace advice given to you by your health care provider. Make sure you discuss any questions you have with your health care provider. Document Revised: 10/27/2020 Document Reviewed: 10/27/2020 Elsevier Patient Education  2022 [employer].  

## 2021-07-11 ENCOUNTER — Inpatient Hospital Stay: Payer: PRIVATE HEALTH INSURANCE

## 2021-07-11 ENCOUNTER — Other Ambulatory Visit: Payer: Self-pay

## 2021-07-11 VITALS — BP 134/92 | HR 94 | Temp 98.3°F | Resp 17

## 2021-07-11 DIAGNOSIS — C180 Malignant neoplasm of cecum: Secondary | ICD-10-CM

## 2021-07-11 DIAGNOSIS — Z5111 Encounter for antineoplastic chemotherapy: Secondary | ICD-10-CM | POA: Diagnosis not present

## 2021-07-11 MED ORDER — SODIUM CHLORIDE 0.9% FLUSH
10.0000 mL | Freq: Once | INTRAVENOUS | Status: AC
  Administered 2021-07-11: 10 mL

## 2021-07-11 MED ORDER — HEPARIN SOD (PORK) LOCK FLUSH 100 UNIT/ML IV SOLN
250.0000 [IU] | Freq: Once | INTRAVENOUS | Status: AC
  Administered 2021-07-11: 250 [IU]

## 2021-07-13 ENCOUNTER — Inpatient Hospital Stay: Payer: PRIVATE HEALTH INSURANCE

## 2021-07-13 ENCOUNTER — Inpatient Hospital Stay (HOSPITAL_BASED_OUTPATIENT_CLINIC_OR_DEPARTMENT_OTHER): Payer: PRIVATE HEALTH INSURANCE | Admitting: Family

## 2021-07-13 ENCOUNTER — Encounter: Payer: Self-pay | Admitting: Family

## 2021-07-13 ENCOUNTER — Telehealth (HOSPITAL_BASED_OUTPATIENT_CLINIC_OR_DEPARTMENT_OTHER): Payer: Self-pay

## 2021-07-13 ENCOUNTER — Other Ambulatory Visit: Payer: Self-pay

## 2021-07-13 VITALS — BP 141/89 | HR 99 | Temp 98.6°F | Resp 18 | Wt 257.0 lb

## 2021-07-13 DIAGNOSIS — C180 Malignant neoplasm of cecum: Secondary | ICD-10-CM

## 2021-07-13 DIAGNOSIS — D509 Iron deficiency anemia, unspecified: Secondary | ICD-10-CM

## 2021-07-13 DIAGNOSIS — I2690 Septic pulmonary embolism without acute cor pulmonale: Secondary | ICD-10-CM

## 2021-07-13 DIAGNOSIS — Z5111 Encounter for antineoplastic chemotherapy: Secondary | ICD-10-CM | POA: Diagnosis not present

## 2021-07-13 LAB — CMP (CANCER CENTER ONLY)
ALT: 24 U/L (ref 0–44)
AST: 18 U/L (ref 15–41)
Albumin: 3.9 g/dL (ref 3.5–5.0)
Alkaline Phosphatase: 93 U/L (ref 38–126)
Anion gap: 12 (ref 5–15)
BUN: 6 mg/dL (ref 6–20)
CO2: 26 mmol/L (ref 22–32)
Calcium: 9.4 mg/dL (ref 8.9–10.3)
Chloride: 104 mmol/L (ref 98–111)
Creatinine: 0.82 mg/dL (ref 0.44–1.00)
GFR, Estimated: >60 mL/min (ref >60)
Glucose, Bld: 124 mg/dL — ABNORMAL HIGH (ref 70–99)
Potassium: 3.6 mmol/L (ref 3.5–5.1)
Sodium: 142 mmol/L (ref 135–145)
Total Bilirubin: 0.3 mg/dL (ref 0.3–1.2)
Total Protein: 6.5 g/dL (ref 6.5–8.1)

## 2021-07-13 LAB — CBC WITH DIFFERENTIAL (CANCER CENTER ONLY)
Abs Immature Granulocytes: 0.01 10*3/uL (ref 0.00–0.07)
Basophils Absolute: 0 10*3/uL (ref 0.0–0.1)
Basophils Relative: 1 %
Eosinophils Absolute: 0.1 10*3/uL (ref 0.0–0.5)
Eosinophils Relative: 1 %
HCT: 36.4 % (ref 36.0–46.0)
Hemoglobin: 11.6 g/dL — ABNORMAL LOW (ref 12.0–15.0)
Immature Granulocytes: 0 %
Lymphocytes Relative: 45 %
Lymphs Abs: 1.7 10*3/uL (ref 0.7–4.0)
MCH: 29.5 pg (ref 26.0–34.0)
MCHC: 31.9 g/dL (ref 30.0–36.0)
MCV: 92.6 fL (ref 80.0–100.0)
Monocytes Absolute: 0.4 10*3/uL (ref 0.1–1.0)
Monocytes Relative: 11 %
Neutro Abs: 1.6 10*3/uL — ABNORMAL LOW (ref 1.7–7.7)
Neutrophils Relative %: 42 %
Platelet Count: 297 10*3/uL (ref 150–400)
RBC: 3.93 MIL/uL (ref 3.87–5.11)
RDW: 16.2 % — ABNORMAL HIGH (ref 11.5–15.5)
Smear Review: NORMAL
WBC Count: 3.9 10*3/uL — ABNORMAL LOW (ref 4.0–10.5)
nRBC: 0 % (ref 0.0–0.2)

## 2021-07-13 LAB — IRON AND IRON BINDING CAPACITY (CC-WL,HP ONLY)
Iron: 73 ug/dL (ref 28–170)
Saturation Ratios: 25 % (ref 10.4–31.8)
TIBC: 288 ug/dL (ref 250–450)
UIBC: 215 ug/dL (ref 148–442)

## 2021-07-13 LAB — CEA (IN HOUSE-CHCC): CEA (CHCC-In House): 4.36 ng/mL (ref 0.00–5.00)

## 2021-07-13 LAB — FERRITIN: Ferritin: 363 ng/mL — ABNORMAL HIGH (ref 11–307)

## 2021-07-13 MED ORDER — PALONOSETRON HCL INJECTION 0.25 MG/5ML
0.2500 mg | Freq: Once | INTRAVENOUS | Status: AC
  Administered 2021-07-13: 0.25 mg via INTRAVENOUS
  Filled 2021-07-13: qty 5

## 2021-07-13 MED ORDER — HEPARIN SOD (PORK) LOCK FLUSH 100 UNIT/ML IV SOLN
250.0000 [IU] | Freq: Once | INTRAVENOUS | Status: AC
  Administered 2021-07-13: 250 [IU]

## 2021-07-13 MED ORDER — SODIUM CHLORIDE 0.9 % IV SOLN
400.0000 mg/m2 | Freq: Once | INTRAVENOUS | Status: AC
  Administered 2021-07-13: 956 mg via INTRAVENOUS
  Filled 2021-07-13: qty 47.8

## 2021-07-13 MED ORDER — SODIUM CHLORIDE 0.9 % IV SOLN
2400.0000 mg/m2 | INTRAVENOUS | Status: DC
  Administered 2021-07-13: 5750 mg via INTRAVENOUS
  Filled 2021-07-13: qty 100

## 2021-07-13 MED ORDER — SODIUM CHLORIDE 0.9 % IV SOLN
10.0000 mg | Freq: Once | INTRAVENOUS | Status: AC
  Administered 2021-07-13: 10 mg via INTRAVENOUS
  Filled 2021-07-13: qty 10

## 2021-07-13 MED ORDER — SODIUM CHLORIDE 0.9% FLUSH
10.0000 mL | Freq: Once | INTRAVENOUS | Status: AC
  Administered 2021-07-13: 10 mL

## 2021-07-13 MED ORDER — SODIUM CHLORIDE 0.9 % IV SOLN: Freq: Once | INTRAVENOUS | Status: AC

## 2021-07-13 MED ORDER — ATROPINE SULFATE 1 MG/ML IV SOLN
0.5000 mg | Freq: Once | INTRAVENOUS | Status: AC | PRN
  Administered 2021-07-13: 0.5 mg via INTRAVENOUS
  Filled 2021-07-13: qty 1

## 2021-07-13 MED ORDER — SODIUM CHLORIDE 0.9 % IV SOLN
150.0000 mg | Freq: Once | INTRAVENOUS | Status: AC
  Administered 2021-07-13: 150 mg via INTRAVENOUS
  Filled 2021-07-13: qty 150

## 2021-07-13 MED ORDER — SODIUM CHLORIDE 0.9 % IV SOLN
165.0000 mg/m2 | Freq: Once | INTRAVENOUS | Status: AC
  Administered 2021-07-13: 400 mg via INTRAVENOUS
  Filled 2021-07-13: qty 15

## 2021-07-13 NOTE — Patient Instructions (Signed)
[facility]  Discharge Instructions: ?Thank you for choosing [facility] to provide your oncology and hematology care.  ? ?If you have a lab appointment with the [facility], please go directly to the [facility] and check in at the registration area. ? ?Wear comfortable clothing and clothing appropriate for easy access to any Portacath or PICC line.  ? ?We strive to give you quality time with your provider. You may need to reschedule your appointment if you arrive late (15 or more minutes).  Arriving late affects you and other patients whose appointments are after yours.  Also, if you miss three or more appointments without notifying the office, you may be dismissed from the clinic at the provider?s discretion.    ?  ?For prescription refill requests, have your pharmacy contact our office and allow 72 hours for refills to be completed.   ? ?Today you received the following chemotherapy and/or immunotherapy agents Irinotecan, Leucovorin and 5FU ?  ?To help prevent nausea and vomiting after your treatment, we encourage you to take your nausea medication as directed. ? ?BELOW ARE SYMPTOMS THAT SHOULD BE REPORTED IMMEDIATELY: ?*FEVER GREATER THAN 100.4 F (38 ?C) OR HIGHER ?*CHILLS OR SWEATING ?*NAUSEA AND VOMITING THAT IS NOT CONTROLLED WITH YOUR NAUSEA MEDICATION ?*UNUSUAL SHORTNESS OF BREATH ?*UNUSUAL BRUISING OR BLEEDING ?*URINARY PROBLEMS (pain or burning when urinating, or frequent urination) ?*BOWEL PROBLEMS (unusual diarrhea, constipation, pain near the anus) ?TENDERNESS IN MOUTH AND THROAT WITH OR WITHOUT PRESENCE OF ULCERS (sore throat, sores in mouth, or a toothache) ?UNUSUAL RASH, SWELLING OR PAIN  ?UNUSUAL VAGINAL DISCHARGE OR ITCHING  ? ?Items with * indicate a potential emergency and should be followed up as soon as possible or go to the Emergency Department if any problems should occur. ? ?Please show the CHEMOTHERAPY ALERT CARD or IMMUNOTHERAPY ALERT CARD at  check-in to the Emergency Department and triage nurse. ?Should you have questions after your visit or need to cancel or reschedule your appointment, please contact [facility]  [phone] and follow the prompts.  Office hours are 8:00 a.m. to 4:30 p.m. Monday - Friday. Please note that voicemails left after 4:00 p.m. may not be returned until the following business day.  We are closed weekends and major holidays. You have access to a nurse at all times for urgent questions. Please call the main number to the clinic [phone] and follow the prompts. ? ?For any non-urgent questions, you may also contact your provider using MyChart. We now offer e-Visits for anyone 76 and older to request care online for non-urgent symptoms. For details visit [email or web address]. ?  ?Also download the MyChart app! Go to the app store, search "MyChart", open the app, select [facility], and log in with your MyChart username and password. ? ?Due to Covid, a mask is required upon entering the hospital/clinic. If you do not have a mask, one will be given to you upon arrival. For doctor visits, patients may have 1 support person aged 85 or older with them. For treatment visits, patients cannot have anyone with them due to current Covid guidelines and our immunocompromised population.  ?

## 2021-07-13 NOTE — Progress Notes (Signed)
?Hematology and Oncology Follow Up Visit ? ?Lauren Mcpherson ?[id number] ?[birth year] 56 y.o. ?07/13/2021 ? ? 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  ?Current Therapy:        ?Status post cycle 12 of FOLFOXIRI/Avastin =-- D/C Avastin due to Pulmonary Embolism and DC Oxaliplatin due to Neuropathy ?Xarelto 20 mg p.o. daily-start on 01/22/2021 ?              ?Interim History:  Lauren Mcpherson is here today for follow-up and treatment. She is doing well but states that the numbness and tingling in her hands and feet persist. She is worried about falling. She denies falls or syncope.  ?No fever, chills, n/v, cough, rash, dizziness, SOB, chest pain, palpitations, abdominal pain or changes in bowel or bladder habits.  ?MRI last week showed unchanged appearance of liver metastasis and no new liver lesions.  ?CEA last visit was down to 3.82.  ?No swelling or tenderness in her extremities.  ?She has maintained a good appetite and is staying well hydrated. Her weight is down 7 lbs since her last visit despite no changes in her diet.   ? ?ECOG Performance Status: 1 - Symptomatic but completely ambulatory ? ?Medications:  ?Allergies as of 07/13/2021   ?No Known Allergies ?  ? ?  ?Medication List  ?  ? ?  ? Accurate as of July 13, 2021  8:48 AM. If you have any questions, ask your nurse or doctor.  ?  ?  ? ?  ? ?dexamethasone 6 MG tablet ?Commonly known as: DECADRON ?Take 1 tablet (6 mg total) by mouth See admin instructions. Take 6 mg by mouth daily for 3 days, starting the day after chemotherapy. Take with food. ?  ?loperamide 2 MG tablet ?Commonly known as: Imodium A-D ?Take 2 at onset of diarrhea, then 1 every 2hrs until 12hr without a BM. May take 2 tab every 4hrs at bedtime. If diarrhea recurs repeat. ?  ?ondansetron 8 MG tablet ?Commonly known as: Zofran ?Take 1 tablet (8 mg total) by mouth 2 (two) times daily as needed. Start on day  3 after chemotherapy. ?  ?prochlorperazine 10 MG tablet ?Commonly known as: COMPAZINE ?Take 1 tablet (10 mg total) by mouth every 6 (six) hours as needed (Nausea or vomiting). ?  ?rivaroxaban 20 MG Tabs tablet ?Commonly known as: XARELTO ?Take 1 tablet (20 mg total) by mouth daily with supper. ?  ? ?  ? ? ?Allergies: No Known Allergies ? ?Past Medical History, Surgical history, Social history, and Family History were reviewed and updated. ? ?Review of Systems: ?All other 10 point review of systems is negative.  ? ?Physical Exam: ? weight is 257 lb (116.6 kg). Her oral temperature is 98.6 ?F (37 ?C). Her blood pressure is 141/89 (abnormal) and her pulse is 99. Her respiration is 18 and oxygen saturation is 100%.  ? ?Wt Readings from Last 3 Encounters:  ?07/13/21 257 lb (116.6 kg)  ?06/29/21 264 lb (119.7 kg)  ?06/14/21 264 lb (119.7 kg)  ? ? ?Ocular: Sclerae unicteric, pupils equal, round and reactive to light ?Ear-nose-throat: Oropharynx clear, dentition fair ?Lymphatic: No cervical or supraclavicular adenopathy ?Lungs no rales or rhonchi, good excursion bilaterally ?Heart regular rate and rhythm, no murmur appreciated ?Abd soft, nontender, positive bowel sounds ?MSK no focal spinal tenderness, no joint edema ?Neuro: non-focal, well-oriented, appropriate affect ?Breasts: Deferred  ? ?Lab Results  ?Component Value Date  ?  WBC 3.9 (L) 07/13/2021  ? HGB 11.6 (L) 07/13/2021  ? HCT 36.4 07/13/2021  ? MCV 92.6 07/13/2021  ? PLT 297 07/13/2021  ? ?Lab Results  ?Component Value Date  ? FERRITIN 382 (H) 06/29/2021  ? IRON 80 06/29/2021  ? TIBC 272 06/29/2021  ? UIBC 192 06/29/2021  ? IRONPCTSAT 30 06/29/2021  ? ?Lab Results  ?Component Value Date  ? RETICCTPCT 3.0 01/04/2021  ? RBC 3.93 07/13/2021  ? ?No results found for: KPAFRELGTCHN, LAMBDASER, KAPLAMBRATIO ?No results found for: IGGSERUM, IGA, IGMSERUM ?No results found for: TOTALPROTELP, ALBUMINELP, A1GS, A2GS, BETS, BETA2SER, GAMS, MSPIKE, SPEI ?  Chemistry   ?    ?Component Value Date/Time  ? NA 142 07/13/2021 0815  ? K 3.6 07/13/2021 0815  ? CL 104 07/13/2021 0815  ? CO2 26 07/13/2021 0815  ? BUN 6 07/13/2021 0815  ? CREATININE 0.82 07/13/2021 0815  ?    ?Component Value Date/Time  ? CALCIUM 9.4 07/13/2021 0815  ? ALKPHOS 93 07/13/2021 0815  ? AST 18 07/13/2021 0815  ? ALT 24 07/13/2021 0815  ? BILITOT 0.3 07/13/2021 0815  ?  ? ? ? ?Impression and Plan: Lauren Mcpherson is a very pleasant 56 yo African American female with metastatic 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  ?We will get a repeat CT angio to evaluate PE response to therapy. If resolved we will reduce her dose to 10 mg PO daily.  ?We will proceed with treatment today as planned.  ?Follow-up in 2 weeks.  ? ?Lottie Dawson, NP ?3/22/20238:48 AM ? ?

## 2021-07-13 NOTE — Patient Instructions (Signed)
PICC Home Care Guide A peripherally inserted central catheter (PICC) is a form of IV access that allows medicines and IV fluids to be quickly put into the blood and spread throughout the body. The PICC is a long, thin, flexible tube (catheter) that is put into a vein in a person's arm or leg. The catheter ends in a large vein just outside the heart called the superior vena cava (SVC). After the PICC is put in, a chest X-ray may be done to make sure that it is in the right place. A PICC may be placed for different reasons, such as: To give medicines and liquid nutrition. To give IV fluids and blood products. To take blood samples often. If there is trouble placing a peripheral intravenous (PIV) catheter. If cared for properly, a PICC can remain in place for many months. Having a PICC can allow you to go home from the hospital sooner and continue treatment at home. Medicines and PICC care can be managed at home by a family member, caregiver, or home health care team. What are the risks? Generally, having a PICC is safe. However, problems may occur, including: A blood clot (thrombus) forming in or at the end of the PICC. A blood clot forming in a vein (deep vein thrombosis) or traveling to the lung (pulmonary embolism). Inflammation of the vein (phlebitis) in which the PICC is placed. Infection at the insertion site or in the blood. Blood infections from central lines, like PICCs, can be serious and often require a hospital stay. PICC malposition, or PICC movement or poor placement. A break or cut in the PICC. Do not use scissors near the PICC. Nerve or tendon irritation or injury during PICC insertion. How to care for your PICC Please follow the specific guidelines provided by your health care provider. Preventing infection You and any caregivers should wash your hands often with soap and water for at least 20 seconds. Wash hands: Before touching the PICC or the infusion device. Before changing a  bandage (dressing). Do not change the dressing unless you have been taught to do so and have shown you are able to change it safely. Flush the PICC as told. Tell your health care provider right away if the PICC is hard to flush or does not flush. Do not use force to flush the PICC. Use clean and germ-free (sterile) supplies only. Keep the supplies in a dry place. Do not reuse needles, syringes, or any other supplies. Reusing supplies can lead to infection. Keep the PICC dressing dry and secure it with tape if the edges stop sticking to your skin. Check your PICC insertion site every day for signs of infection. Check for: Redness, swelling, or pain. Fluid or blood. Warmth. Pus or a bad smell. Preventing other problems Do not use a syringe that is less than 10 mL to flush the PICC. Do not have your blood pressure checked on the arm in which the PICC is placed. Do not ever pull or tug on the PICC. Keep it secured to your arm with tape or a stretch wrap when not in use. Do not take the PICC out yourself. Only a trained health care provider should remove the PICC. Keep pets and children away from your PICC. How to care for your PICC dressing Keep your PICC dressing clean and dry to prevent infection. Do not take baths, swim, or use a hot tub until your health care provider approves. Ask your health care provider if you can take   showers. You may only be allowed to take sponge baths. When you are allowed to shower: Ask your health care provider to teach you how to wrap the PICC. Cover the PICC with clear plastic wrap and tape to keep it dry while showering. Follow instructions from your health care provider about how to take care of your insertion site and dressing. Make sure you: Wash your hands with soap and water for at least 20 seconds before and after you change your dressing. If soap and water are not available, use hand sanitizer. Change your dressing only if taught to do so by your health care  provider. Your PICC dressing needs to be changed if it becomes loose or wet. Leave stitches (sutures), skin glue, or adhesive strips in place. These skin closures may need to stay in place for 2 weeks or longer. If adhesive strip edges start to loosen and curl up, you may trim the loose edges. Do not remove adhesive strips completely unless your health care provider tells you to do that. Follow these instructions at home: Disposal of supplies Throw away any syringes in a disposal container that is meant for sharp items (sharps container). You can buy a sharps container from a pharmacy, or you can make one by using an empty, hard plastic bottle with a lid. Place any used dressings or infusion bags into a plastic bag. Throw that bag in the trash. General instructions  Always carry your PICC identification card or wear a medical alert bracelet. Keep the tube clamped at all times, unless it is being used. Always carry a smooth-edge clamp with you to clamp the PICC if it breaks. Do not use scissors or sharp objects near the tube. You may bend your arm and move it freely. If your PICC is near or at the bend of your elbow, avoid activity with repeated motion at the elbow. Avoid lifting heavy objects as told by your health care provider. Keep all follow-up visits. This is important. You will need to have your PICC dressing changed at least once a week. Contact a health care provider if: You have pain in your arm, ear, face, or teeth. You have a fever or chills. You have redness, swelling, or pain around the insertion site. You have fluid or blood coming from the insertion site. Your insertion site feels warm to the touch. You have pus or a bad smell coming from the insertion site. Your skin feels hard and raised around the insertion site. Your PICC dressing has gotten wet or is coming off and you have not been taught how to change it. Get help right away if: You have problems with your PICC, such as  your PICC: Was tugged or pulled and has partially come out. Do not  push the PICC back in. Cannot be flushed, is hard to flush, or leaks around the insertion site when it is flushed. Makes a flushing sound when it is flushed. Appears to have a hole or tear. Is accidentally pulled all the way out. If this happens, cover the insertion site with a gauze dressing. Do not throw the PICC away. Your health care provider will need to check it to be sure the entire catheter came out. You feel your heart racing or skipping beats, or you have chest pain. You have shortness of breath or trouble breathing. You have swelling, redness, warmth, or pain in the arm in which the PICC is placed. You have a red streak going up your arm that   starts under the PICC dressing. These symptoms may be an emergency. Get help right away. Call 911. Do not wait to see if the symptoms will go away. Do not drive yourself to the hospital. Summary A peripherally inserted central catheter (PICC) is a long, thin, flexible tube (catheter) that is put into a vein in the arm or leg. If cared for properly, a PICC can remain in place for many months. Having a PICC can allow you to go home from the hospital sooner and continue treatment at home. The PICC is inserted using a germ-free (sterile) technique by a specially trained health care provider. Only a trained health care provider should remove it. Do not have your blood pressure checked on the arm in which your PICC is placed. Always keep your PICC identification card with you. This information is not intended to replace advice given to you by your health care provider. Make sure you discuss any questions you have with your health care provider. Document Revised: 10/27/2020 Document Reviewed: 10/27/2020 Elsevier Patient Education  2022 [employer].  

## 2021-07-15 ENCOUNTER — Other Ambulatory Visit: Payer: Self-pay

## 2021-07-15 ENCOUNTER — Inpatient Hospital Stay: Payer: PRIVATE HEALTH INSURANCE

## 2021-07-15 VITALS — BP 117/72 | HR 101 | Temp 98.5°F

## 2021-07-15 DIAGNOSIS — C180 Malignant neoplasm of cecum: Secondary | ICD-10-CM

## 2021-07-15 DIAGNOSIS — Z452 Encounter for adjustment and management of vascular access device: Secondary | ICD-10-CM

## 2021-07-15 DIAGNOSIS — Z5111 Encounter for antineoplastic chemotherapy: Secondary | ICD-10-CM | POA: Diagnosis not present

## 2021-07-15 MED ORDER — HEPARIN SOD (PORK) LOCK FLUSH 100 UNIT/ML IV SOLN
500.0000 [IU] | Freq: Once | INTRAVENOUS | Status: AC
  Administered 2021-07-15: 500 [IU] via INTRAVENOUS

## 2021-07-15 MED ORDER — SODIUM CHLORIDE 0.9% FLUSH
10.0000 mL | Freq: Once | INTRAVENOUS | Status: AC
  Administered 2021-07-15: 10 mL via INTRAVENOUS

## 2021-07-15 MED ORDER — PEGFILGRASTIM INJECTION 6 MG/0.6ML ~~LOC~~
6.0000 mg | PREFILLED_SYRINGE | Freq: Once | SUBCUTANEOUS | Status: AC
  Administered 2021-07-15: 6 mg via SUBCUTANEOUS
  Filled 2021-07-15: qty 0.6

## 2021-07-15 NOTE — Addendum Note (Signed)
Addended by: Lucile Crater on: 07/15/2021 02:01 PM ? ? Modules accepted: Orders ? ?

## 2021-07-15 NOTE — Patient Instructions (Signed)
PICC Home Care Guide A peripherally inserted central catheter (PICC) is a form of IV access that allows medicines and IV fluids to be quickly put into the blood and spread throughout the body. The PICC is a long, thin, flexible tube (catheter) that is put into a vein in a person's arm or leg. The catheter ends in a large vein just outside the heart called the superior vena cava (SVC). After the PICC is put in, a chest X-ray may be done to make sure that it is in the right place. A PICC may be placed for different reasons, such as: To give medicines and liquid nutrition. To give IV fluids and blood products. To take blood samples often. If there is trouble placing a peripheral intravenous (PIV) catheter. If cared for properly, a PICC can remain in place for many months. Having a PICC can allow you to go home from the hospital sooner and continue treatment at home. Medicines and PICC care can be managed at home by a family member, caregiver, or home health care team. What are the risks? Generally, having a PICC is safe. However, problems may occur, including: A blood clot (thrombus) forming in or at the end of the PICC. A blood clot forming in a vein (deep vein thrombosis) or traveling to the lung (pulmonary embolism). Inflammation of the vein (phlebitis) in which the PICC is placed. Infection at the insertion site or in the blood. Blood infections from central lines, like PICCs, can be serious and often require a hospital stay. PICC malposition, or PICC movement or poor placement. A break or cut in the PICC. Do not use scissors near the PICC. Nerve or tendon irritation or injury during PICC insertion. How to care for your PICC Please follow the specific guidelines provided by your health care provider. Preventing infection You and any caregivers should wash your hands often with soap and water for at least 20 seconds. Wash hands: Before touching the PICC or the infusion device. Before changing a  bandage (dressing). Do not change the dressing unless you have been taught to do so and have shown you are able to change it safely. Flush the PICC as told. Tell your health care provider right away if the PICC is hard to flush or does not flush. Do not use force to flush the PICC. Use clean and germ-free (sterile) supplies only. Keep the supplies in a dry place. Do not reuse needles, syringes, or any other supplies. Reusing supplies can lead to infection. Keep the PICC dressing dry and secure it with tape if the edges stop sticking to your skin. Check your PICC insertion site every day for signs of infection. Check for: Redness, swelling, or pain. Fluid or blood. Warmth. Pus or a bad smell. Preventing other problems Do not use a syringe that is less than 10 mL to flush the PICC. Do not have your blood pressure checked on the arm in which the PICC is placed. Do not ever pull or tug on the PICC. Keep it secured to your arm with tape or a stretch wrap when not in use. Do not take the PICC out yourself. Only a trained health care provider should remove the PICC. Keep pets and children away from your PICC. How to care for your PICC dressing Keep your PICC dressing clean and dry to prevent infection. Do not take baths, swim, or use a hot tub until your health care provider approves. Ask your health care provider if you can take   showers. You may only be allowed to take sponge baths. When you are allowed to shower: Ask your health care provider to teach you how to wrap the PICC. Cover the PICC with clear plastic wrap and tape to keep it dry while showering. Follow instructions from your health care provider about how to take care of your insertion site and dressing. Make sure you: Wash your hands with soap and water for at least 20 seconds before and after you change your dressing. If soap and water are not available, use hand sanitizer. Change your dressing only if taught to do so by your health care  provider. Your PICC dressing needs to be changed if it becomes loose or wet. Leave stitches (sutures), skin glue, or adhesive strips in place. These skin closures may need to stay in place for 2 weeks or longer. If adhesive strip edges start to loosen and curl up, you may trim the loose edges. Do not remove adhesive strips completely unless your health care provider tells you to do that. Follow these instructions at home: Disposal of supplies Throw away any syringes in a disposal container that is meant for sharp items (sharps container). You can buy a sharps container from a pharmacy, or you can make one by using an empty, hard plastic bottle with a lid. Place any used dressings or infusion bags into a plastic bag. Throw that bag in the trash. General instructions  Always carry your PICC identification card or wear a medical alert bracelet. Keep the tube clamped at all times, unless it is being used. Always carry a smooth-edge clamp with you to clamp the PICC if it breaks. Do not use scissors or sharp objects near the tube. You may bend your arm and move it freely. If your PICC is near or at the bend of your elbow, avoid activity with repeated motion at the elbow. Avoid lifting heavy objects as told by your health care provider. Keep all follow-up visits. This is important. You will need to have your PICC dressing changed at least once a week. Contact a health care provider if: You have pain in your arm, ear, face, or teeth. You have a fever or chills. You have redness, swelling, or pain around the insertion site. You have fluid or blood coming from the insertion site. Your insertion site feels warm to the touch. You have pus or a bad smell coming from the insertion site. Your skin feels hard and raised around the insertion site. Your PICC dressing has gotten wet or is coming off and you have not been taught how to change it. Get help right away if: You have problems with your PICC, such as  your PICC: Was tugged or pulled and has partially come out. Do not  push the PICC back in. Cannot be flushed, is hard to flush, or leaks around the insertion site when it is flushed. Makes a flushing sound when it is flushed. Appears to have a hole or tear. Is accidentally pulled all the way out. If this happens, cover the insertion site with a gauze dressing. Do not throw the PICC away. Your health care provider will need to check it to be sure the entire catheter came out. You feel your heart racing or skipping beats, or you have chest pain. You have shortness of breath or trouble breathing. You have swelling, redness, warmth, or pain in the arm in which the PICC is placed. You have a red streak going up your arm that   starts under the PICC dressing. These symptoms may be an emergency. Get help right away. Call 911. Do not wait to see if the symptoms will go away. Do not drive yourself to the hospital. Summary A peripherally inserted central catheter (PICC) is a long, thin, flexible tube (catheter) that is put into a vein in the arm or leg. If cared for properly, a PICC can remain in place for many months. Having a PICC can allow you to go home from the hospital sooner and continue treatment at home. The PICC is inserted using a germ-free (sterile) technique by a specially trained health care provider. Only a trained health care provider should remove it. Do not have your blood pressure checked on the arm in which your PICC is placed. Always keep your PICC identification card with you. This information is not intended to replace advice given to you by your health care provider. Make sure you discuss any questions you have with your health care provider. Document Revised: 10/27/2020 Document Reviewed: 10/27/2020 Elsevier Patient Education  2022 [employer].  

## 2021-07-18 ENCOUNTER — Inpatient Hospital Stay: Payer: PRIVATE HEALTH INSURANCE

## 2021-07-18 ENCOUNTER — Other Ambulatory Visit: Payer: Self-pay

## 2021-07-18 VITALS — BP 124/78 | HR 70 | Temp 98.1°F | Resp 18

## 2021-07-18 DIAGNOSIS — Z5111 Encounter for antineoplastic chemotherapy: Secondary | ICD-10-CM | POA: Diagnosis not present

## 2021-07-18 DIAGNOSIS — C180 Malignant neoplasm of cecum: Secondary | ICD-10-CM

## 2021-07-18 MED ORDER — HEPARIN SOD (PORK) LOCK FLUSH 100 UNIT/ML IV SOLN
500.0000 [IU] | Freq: Once | INTRAVENOUS | Status: AC
  Administered 2021-07-18: 500 [IU]

## 2021-07-18 MED ORDER — SODIUM CHLORIDE 0.9% FLUSH
10.0000 mL | Freq: Once | INTRAVENOUS | Status: AC
  Administered 2021-07-18: 10 mL

## 2021-07-18 NOTE — Patient Instructions (Signed)

## 2021-07-20 ENCOUNTER — Inpatient Hospital Stay: Payer: PRIVATE HEALTH INSURANCE

## 2021-07-20 VITALS — BP 130/74 | HR 70 | Temp 98.2°F | Resp 18

## 2021-07-20 DIAGNOSIS — Z5111 Encounter for antineoplastic chemotherapy: Secondary | ICD-10-CM | POA: Diagnosis not present

## 2021-07-20 DIAGNOSIS — Z452 Encounter for adjustment and management of vascular access device: Secondary | ICD-10-CM

## 2021-07-20 MED ORDER — SODIUM CHLORIDE 0.9% FLUSH
10.0000 mL | Freq: Once | INTRAVENOUS | Status: AC
  Administered 2021-07-20: 10 mL via INTRAVENOUS

## 2021-07-20 MED ORDER — HEPARIN SOD (PORK) LOCK FLUSH 100 UNIT/ML IV SOLN
250.0000 [IU] | Freq: Once | INTRAVENOUS | Status: AC
  Administered 2021-07-20: 250 [IU] via INTRAVENOUS

## 2021-07-22 ENCOUNTER — Other Ambulatory Visit: Payer: Self-pay | Admitting: Family

## 2021-07-22 ENCOUNTER — Telehealth: Payer: Self-pay | Admitting: *Deleted

## 2021-07-22 ENCOUNTER — Ambulatory Visit (HOSPITAL_BASED_OUTPATIENT_CLINIC_OR_DEPARTMENT_OTHER)
Admission: RE | Admit: 2021-07-22 | Discharge: 2021-07-22 | Disposition: A | Discharge status: Home / Self Care | Payer: PRIVATE HEALTH INSURANCE | Source: Ambulatory Visit | Attending: Family | Admitting: Family

## 2021-07-22 ENCOUNTER — Encounter (HOSPITAL_BASED_OUTPATIENT_CLINIC_OR_DEPARTMENT_OTHER): Payer: Self-pay

## 2021-07-22 ENCOUNTER — Inpatient Hospital Stay: Payer: PRIVATE HEALTH INSURANCE

## 2021-07-22 DIAGNOSIS — Z09 Encounter for follow-up examination after completed treatment for conditions other than malignant neoplasm: Secondary | ICD-10-CM | POA: Insufficient documentation

## 2021-07-22 DIAGNOSIS — I2699 Other pulmonary embolism without acute cor pulmonale: Secondary | ICD-10-CM | POA: Diagnosis not present

## 2021-07-22 DIAGNOSIS — I2690 Septic pulmonary embolism without acute cor pulmonale: Secondary | ICD-10-CM

## 2021-07-22 DIAGNOSIS — C180 Malignant neoplasm of cecum: Secondary | ICD-10-CM

## 2021-07-22 MED ORDER — HEPARIN SOD (PORK) LOCK FLUSH 100 UNIT/ML IV SOLN
250.0000 [IU] | Freq: Once | INTRAVENOUS | Status: AC
  Administered 2021-07-22: 250 [IU]

## 2021-07-22 MED ORDER — SODIUM CHLORIDE 0.9% FLUSH
10.0000 mL | Freq: Once | INTRAVENOUS | Status: AC
  Administered 2021-07-22: 10 mL

## 2021-07-22 MED ORDER — ALTEPLASE 2 MG IJ SOLR: 2.0000 mg | Freq: Once | INTRAMUSCULAR | Status: DC

## 2021-07-22 MED ORDER — IOHEXOL 350 MG/ML SOLN
100.0000 mL | Freq: Once | INTRAVENOUS | Status: AC | PRN
Start: 2021-07-22 — End: 2021-07-22
  Administered 2021-07-22: 100 mL via INTRAVENOUS

## 2021-07-22 MED ORDER — RIVAROXABAN 10 MG PO TABS: 10.0000 mg | ORAL_TABLET | Freq: Every day | ORAL | 5 refills | Status: DC

## 2021-07-22 NOTE — Telephone Encounter (Signed)
As noted below by Lottie Dawson, NP, I informed the patient that the PE has resolved! It is OK to reduce your Xarelto to 10 mg PO daily. Patient stated,"I have 20 mg tablets. Can they be cut in half?" Per [email or web address], Xarelto tablets should not be cut in half. I instructed her to continue taking the 20 mg tablets over the weekend and when she comes in on Monday we can talk about the 10 mg tablets. She verbalized understanding. ?

## 2021-07-22 NOTE — Telephone Encounter (Signed)
-----   Message from Celso Amy, NP sent at 07/22/2021  3:27 PM EDT ----- ?PE has resolved! WOO HOO!!! Ok to reduce her Xarelto to 10 mg PO daily. Thank you! ? ? ? ?----- Message ----- ?From: Interface, Rad Results In ?Sent: 07/22/2021   3:13 PM EDT ?To: Celso Amy, NP ? ? ?

## 2021-07-22 NOTE — Patient Instructions (Signed)
PICC Home Care Guide A peripherally inserted central catheter (PICC) is a form of IV access that allows medicines and IV fluids to be quickly put into the blood and spread throughout the body. The PICC is a long, thin, flexible tube (catheter) that is put into a vein in a person's arm or leg. The catheter ends in a large vein just outside the heart called the superior vena cava (SVC). After the PICC is put in, a chest X-ray may be done to make sure that it is in the right place. A PICC may be placed for different reasons, such as: To give medicines and liquid nutrition. To give IV fluids and blood products. To take blood samples often. If there is trouble placing a peripheral intravenous (PIV) catheter. If cared for properly, a PICC can remain in place for many months. Having a PICC can allow you to go home from the hospital sooner and continue treatment at home. Medicines and PICC care can be managed at home by a family member, caregiver, or home health care team. What are the risks? Generally, having a PICC is safe. However, problems may occur, including: A blood clot (thrombus) forming in or at the end of the PICC. A blood clot forming in a vein (deep vein thrombosis) or traveling to the lung (pulmonary embolism). Inflammation of the vein (phlebitis) in which the PICC is placed. Infection at the insertion site or in the blood. Blood infections from central lines, like PICCs, can be serious and often require a hospital stay. PICC malposition, or PICC movement or poor placement. A break or cut in the PICC. Do not use scissors near the PICC. Nerve or tendon irritation or injury during PICC insertion. How to care for your PICC Please follow the specific guidelines provided by your health care provider. Preventing infection You and any caregivers should wash your hands often with soap and water for at least 20 seconds. Wash hands: Before touching the PICC or the infusion device. Before changing a  bandage (dressing). Do not change the dressing unless you have been taught to do so and have shown you are able to change it safely. Flush the PICC as told. Tell your health care provider right away if the PICC is hard to flush or does not flush. Do not use force to flush the PICC. Use clean and germ-free (sterile) supplies only. Keep the supplies in a dry place. Do not reuse needles, syringes, or any other supplies. Reusing supplies can lead to infection. Keep the PICC dressing dry and secure it with tape if the edges stop sticking to your skin. Check your PICC insertion site every day for signs of infection. Check for: Redness, swelling, or pain. Fluid or blood. Warmth. Pus or a bad smell. Preventing other problems Do not use a syringe that is less than 10 mL to flush the PICC. Do not have your blood pressure checked on the arm in which the PICC is placed. Do not ever pull or tug on the PICC. Keep it secured to your arm with tape or a stretch wrap when not in use. Do not take the PICC out yourself. Only a trained health care provider should remove the PICC. Keep pets and children away from your PICC. How to care for your PICC dressing Keep your PICC dressing clean and dry to prevent infection. Do not take baths, swim, or use a hot tub until your health care provider approves. Ask your health care provider if you can take   showers. You may only be allowed to take sponge baths. When you are allowed to shower: Ask your health care provider to teach you how to wrap the PICC. Cover the PICC with clear plastic wrap and tape to keep it dry while showering. Follow instructions from your health care provider about how to take care of your insertion site and dressing. Make sure you: Wash your hands with soap and water for at least 20 seconds before and after you change your dressing. If soap and water are not available, use hand sanitizer. Change your dressing only if taught to do so by your health care  provider. Your PICC dressing needs to be changed if it becomes loose or wet. Leave stitches (sutures), skin glue, or adhesive strips in place. These skin closures may need to stay in place for 2 weeks or longer. If adhesive strip edges start to loosen and curl up, you may trim the loose edges. Do not remove adhesive strips completely unless your health care provider tells you to do that. Follow these instructions at home: Disposal of supplies Throw away any syringes in a disposal container that is meant for sharp items (sharps container). You can buy a sharps container from a pharmacy, or you can make one by using an empty, hard plastic bottle with a lid. Place any used dressings or infusion bags into a plastic bag. Throw that bag in the trash. General instructions  Always carry your PICC identification card or wear a medical alert bracelet. Keep the tube clamped at all times, unless it is being used. Always carry a smooth-edge clamp with you to clamp the PICC if it breaks. Do not use scissors or sharp objects near the tube. You may bend your arm and move it freely. If your PICC is near or at the bend of your elbow, avoid activity with repeated motion at the elbow. Avoid lifting heavy objects as told by your health care provider. Keep all follow-up visits. This is important. You will need to have your PICC dressing changed at least once a week. Contact a health care provider if: You have pain in your arm, ear, face, or teeth. You have a fever or chills. You have redness, swelling, or pain around the insertion site. You have fluid or blood coming from the insertion site. Your insertion site feels warm to the touch. You have pus or a bad smell coming from the insertion site. Your skin feels hard and raised around the insertion site. Your PICC dressing has gotten wet or is coming off and you have not been taught how to change it. Get help right away if: You have problems with your PICC, such as  your PICC: Was tugged or pulled and has partially come out. Do not  push the PICC back in. Cannot be flushed, is hard to flush, or leaks around the insertion site when it is flushed. Makes a flushing sound when it is flushed. Appears to have a hole or tear. Is accidentally pulled all the way out. If this happens, cover the insertion site with a gauze dressing. Do not throw the PICC away. Your health care provider will need to check it to be sure the entire catheter came out. You feel your heart racing or skipping beats, or you have chest pain. You have shortness of breath or trouble breathing. You have swelling, redness, warmth, or pain in the arm in which the PICC is placed. You have a red streak going up your arm that   starts under the PICC dressing. These symptoms may be an emergency. Get help right away. Call 911. Do not wait to see if the symptoms will go away. Do not drive yourself to the hospital. Summary A peripherally inserted central catheter (PICC) is a long, thin, flexible tube (catheter) that is put into a vein in the arm or leg. If cared for properly, a PICC can remain in place for many months. Having a PICC can allow you to go home from the hospital sooner and continue treatment at home. The PICC is inserted using a germ-free (sterile) technique by a specially trained health care provider. Only a trained health care provider should remove it. Do not have your blood pressure checked on the arm in which your PICC is placed. Always keep your PICC identification card with you. This information is not intended to replace advice given to you by your health care provider. Make sure you discuss any questions you have with your health care provider. Document Revised: 10/27/2020 Document Reviewed: 10/27/2020 Elsevier Patient Education  2022 [employer].  

## 2021-07-25 ENCOUNTER — Inpatient Hospital Stay: Payer: PRIVATE HEALTH INSURANCE | Attending: Hematology & Oncology

## 2021-07-25 VITALS — BP 135/80 | HR 71 | Temp 98.5°F | Resp 20

## 2021-07-25 DIAGNOSIS — Z5111 Encounter for antineoplastic chemotherapy: Secondary | ICD-10-CM | POA: Diagnosis present

## 2021-07-25 DIAGNOSIS — C787 Secondary malignant neoplasm of liver and intrahepatic bile duct: Secondary | ICD-10-CM | POA: Diagnosis not present

## 2021-07-25 DIAGNOSIS — C180 Malignant neoplasm of cecum: Secondary | ICD-10-CM | POA: Insufficient documentation

## 2021-07-25 DIAGNOSIS — Z7901 Long term (current) use of anticoagulants: Secondary | ICD-10-CM | POA: Insufficient documentation

## 2021-07-25 DIAGNOSIS — Z452 Encounter for adjustment and management of vascular access device: Secondary | ICD-10-CM | POA: Diagnosis not present

## 2021-07-25 DIAGNOSIS — D509 Iron deficiency anemia, unspecified: Secondary | ICD-10-CM | POA: Insufficient documentation

## 2021-07-25 DIAGNOSIS — Z86711 Personal history of pulmonary embolism: Secondary | ICD-10-CM | POA: Insufficient documentation

## 2021-07-25 MED ORDER — HEPARIN SOD (PORK) LOCK FLUSH 100 UNIT/ML IV SOLN
250.0000 [IU] | Freq: Once | INTRAVENOUS | Status: AC
  Administered 2021-07-25: 250 [IU]

## 2021-07-25 MED ORDER — SODIUM CHLORIDE 0.9% FLUSH
10.0000 mL | Freq: Once | INTRAVENOUS | Status: AC
  Administered 2021-07-25: 10 mL

## 2021-07-27 ENCOUNTER — Other Ambulatory Visit: Payer: Self-pay | Admitting: Hematology & Oncology

## 2021-07-27 ENCOUNTER — Inpatient Hospital Stay: Payer: PRIVATE HEALTH INSURANCE

## 2021-07-27 ENCOUNTER — Inpatient Hospital Stay (HOSPITAL_BASED_OUTPATIENT_CLINIC_OR_DEPARTMENT_OTHER): Payer: PRIVATE HEALTH INSURANCE | Admitting: Family

## 2021-07-27 ENCOUNTER — Encounter: Payer: Self-pay | Admitting: Family

## 2021-07-27 VITALS — BP 122/89 | HR 85 | Temp 98.9°F | Resp 18 | Wt 259.0 lb

## 2021-07-27 DIAGNOSIS — C180 Malignant neoplasm of cecum: Secondary | ICD-10-CM

## 2021-07-27 DIAGNOSIS — D509 Iron deficiency anemia, unspecified: Secondary | ICD-10-CM

## 2021-07-27 DIAGNOSIS — I2690 Septic pulmonary embolism without acute cor pulmonale: Secondary | ICD-10-CM

## 2021-07-27 DIAGNOSIS — Z5111 Encounter for antineoplastic chemotherapy: Secondary | ICD-10-CM | POA: Diagnosis not present

## 2021-07-27 LAB — CBC WITH DIFFERENTIAL (CANCER CENTER ONLY)
Abs Immature Granulocytes: 0.02 10*3/uL (ref 0.00–0.07)
Basophils Absolute: 0.1 10*3/uL (ref 0.0–0.1)
Basophils Relative: 1 %
Eosinophils Absolute: 0.1 10*3/uL (ref 0.0–0.5)
Eosinophils Relative: 2 %
HCT: 35.7 % — ABNORMAL LOW (ref 36.0–46.0)
Hemoglobin: 11.3 g/dL — ABNORMAL LOW (ref 12.0–15.0)
Immature Granulocytes: 1 %
Lymphocytes Relative: 38 %
Lymphs Abs: 1.6 10*3/uL (ref 0.7–4.0)
MCH: 29.1 pg (ref 26.0–34.0)
MCHC: 31.7 g/dL (ref 30.0–36.0)
MCV: 92 fL (ref 80.0–100.0)
Monocytes Absolute: 0.5 10*3/uL (ref 0.1–1.0)
Monocytes Relative: 12 %
Neutro Abs: 2 10*3/uL (ref 1.7–7.7)
Neutrophils Relative %: 46 %
Platelet Count: 265 10*3/uL (ref 150–400)
RBC: 3.88 MIL/uL (ref 3.87–5.11)
RDW: 16.6 % — ABNORMAL HIGH (ref 11.5–15.5)
Smear Review: NORMAL
WBC Count: 4.2 10*3/uL (ref 4.0–10.5)
nRBC: 0 % (ref 0.0–0.2)

## 2021-07-27 LAB — CMP (CANCER CENTER ONLY)
ALT: 23 U/L (ref 0–44)
AST: 15 U/L (ref 15–41)
Albumin: 3.7 g/dL (ref 3.5–5.0)
Alkaline Phosphatase: 81 U/L (ref 38–126)
Anion gap: 9 (ref 5–15)
BUN: 7 mg/dL (ref 6–20)
CO2: 29 mmol/L (ref 22–32)
Calcium: 9.2 mg/dL (ref 8.9–10.3)
Chloride: 105 mmol/L (ref 98–111)
Creatinine: 0.81 mg/dL (ref 0.44–1.00)
GFR, Estimated: >60 mL/min (ref >60)
Glucose, Bld: 105 mg/dL — ABNORMAL HIGH (ref 70–99)
Potassium: 3.3 mmol/L — ABNORMAL LOW (ref 3.5–5.1)
Sodium: 143 mmol/L (ref 135–145)
Total Bilirubin: 0.4 mg/dL (ref 0.3–1.2)
Total Protein: 6 g/dL — ABNORMAL LOW (ref 6.5–8.1)

## 2021-07-27 LAB — FERRITIN: Ferritin: 300 ng/mL (ref 11–307)

## 2021-07-27 LAB — IRON AND IRON BINDING CAPACITY (CC-WL,HP ONLY)
Iron: 49 ug/dL (ref 28–170)
Saturation Ratios: 18 % (ref 10.4–31.8)
TIBC: 279 ug/dL (ref 250–450)
UIBC: 230 ug/dL (ref 148–442)

## 2021-07-27 LAB — CEA (IN HOUSE-CHCC): CEA (CHCC-In House): 3.68 ng/mL (ref 0.00–5.00)

## 2021-07-27 MED ORDER — SODIUM CHLORIDE 0.9 % IV SOLN
400.0000 mg/m2 | Freq: Once | INTRAVENOUS | Status: AC
  Administered 2021-07-27: 956 mg via INTRAVENOUS
  Filled 2021-07-27: qty 47.8

## 2021-07-27 MED ORDER — SODIUM CHLORIDE 0.9% FLUSH: 10.0000 mL | INTRAVENOUS | Status: DC | PRN

## 2021-07-27 MED ORDER — SODIUM CHLORIDE 0.9 % IV SOLN
165.0000 mg/m2 | Freq: Once | INTRAVENOUS | Status: AC
  Administered 2021-07-27: 400 mg via INTRAVENOUS
  Filled 2021-07-27: qty 15

## 2021-07-27 MED ORDER — SODIUM CHLORIDE 0.9 % IV SOLN: Freq: Once | INTRAVENOUS | Status: AC

## 2021-07-27 MED ORDER — SODIUM CHLORIDE 0.9 % IV SOLN
10.0000 mg | Freq: Once | INTRAVENOUS | Status: AC
  Administered 2021-07-27: 10 mg via INTRAVENOUS
  Filled 2021-07-27: qty 10

## 2021-07-27 MED ORDER — HEPARIN SOD (PORK) LOCK FLUSH 100 UNIT/ML IV SOLN: 500.0000 [IU] | Freq: Once | INTRAVENOUS | Status: DC | PRN

## 2021-07-27 MED ORDER — ATROPINE SULFATE 1 MG/ML IV SOLN
0.5000 mg | Freq: Once | INTRAVENOUS | Status: AC | PRN
  Administered 2021-07-27: 0.5 mg via INTRAVENOUS
  Filled 2021-07-27: qty 1

## 2021-07-27 MED ORDER — PALONOSETRON HCL INJECTION 0.25 MG/5ML
0.2500 mg | Freq: Once | INTRAVENOUS | Status: AC
  Administered 2021-07-27: 0.25 mg via INTRAVENOUS
  Filled 2021-07-27: qty 5

## 2021-07-27 MED ORDER — SODIUM CHLORIDE 0.9 % IV SOLN
2400.0000 mg/m2 | INTRAVENOUS | Status: DC
  Administered 2021-07-27: 5750 mg via INTRAVENOUS
  Filled 2021-07-27: qty 115

## 2021-07-27 MED ORDER — SODIUM CHLORIDE 0.9 % IV SOLN
150.0000 mg | Freq: Once | INTRAVENOUS | Status: AC
  Administered 2021-07-27: 150 mg via INTRAVENOUS
  Filled 2021-07-27: qty 150

## 2021-07-27 NOTE — Progress Notes (Signed)
?Hematology and Oncology Follow Up Visit ? ?Lauren Mcpherson ?[id number] ?11-14-1965 56 y.o. ?07/27/2021 ? ? 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  ?Current Therapy:        ?Status post cycle 12 of FOLFOXIRI/Avastin =-- D/C Avastin due to Pulmonary Embolism and DC Oxaliplatin due to Neuropathy ?Xarelto 20 mg PO daily - start on 01/22/2021, decreased to 10 mg PO started 07/26/2021 ?  ?Interim History:  Lauren Mcpherson is here today for follow-up and treatment. She is doing well but states that the numbness and tingling in her hands and feet persists.   ?CEA last month was 4.36 ?No fever, chills, n/v, cough, rash, dizziness, SOB, chest pain, palpitations, abdominal pain or changes in bowel or bladder habits.  ?No blood loss, bruising or petechiae.  ?No swelling or tenderness in her extremities.  ?No falls or syncope but balance is effected by the neuropathy.  ?Her appetite is good and she is staying well hydrated. Her weight is stable at 259 lbs.  ? ?ECOG Performance Status: 1 - Symptomatic but completely ambulatory ? ?Medications:  ?Allergies as of 07/27/2021   ?No Known Allergies ?  ? ?  ?Medication List  ?  ? ?  ? Accurate as of July 27, 2021  8:48 AM. If you have any questions, ask your nurse or doctor.  ?  ?  ? ?  ? ?dexamethasone 6 MG tablet ?Commonly known as: DECADRON ?Take 1 tablet (6 mg total) by mouth See admin instructions. Take 6 mg by mouth daily for 3 days, starting the day after chemotherapy. Take with food. ?  ?loperamide 2 MG tablet ?Commonly known as: Imodium A-D ?Take 2 at onset of diarrhea, then 1 every 2hrs until 12hr without a BM. May take 2 tab every 4hrs at bedtime. If diarrhea recurs repeat. ?  ?ondansetron 8 MG tablet ?Commonly known as: Zofran ?Take 1 tablet (8 mg total) by mouth 2 (two) times daily as needed. Start on day 3 after chemotherapy. ?  ?prochlorperazine 10 MG tablet ?Commonly known as:  COMPAZINE ?Take 1 tablet (10 mg total) by mouth every 6 (six) hours as needed (Nausea or vomiting). ?  ?rivaroxaban 10 MG Tabs tablet ?Commonly known as: XARELTO ?Take 1 tablet (10 mg total) by mouth daily with supper. ?  ? ?  ? ? ?Allergies: No Known Allergies ? ?Past Medical History, Surgical history, Social history, and Family History were reviewed and updated. ? ?Review of Systems: ?All other 10 point review of systems is negative.  ? ?Physical Exam: ? weight is 259 lb (117.5 kg). Her oral temperature is 98.9 ?F (37.2 ?C). Her blood pressure is 122/89 and her pulse is 85. Her respiration is 18 and oxygen saturation is 100%.  ? ?Wt Readings from Last 3 Encounters:  ?07/27/21 259 lb (117.5 kg)  ?07/27/21 259 lb (117.5 kg)  ?07/13/21 257 lb (116.6 kg)  ? ? ?Ocular: Sclerae unicteric, pupils equal, round and reactive to light ?Ear-nose-throat: Oropharynx clear, dentition fair ?Lymphatic: No cervical or supraclavicular adenopathy ?Lungs no rales or rhonchi, good excursion bilaterally ?Heart regular rate and rhythm, no murmur appreciated ?Abd soft, nontender, positive bowel sounds ?MSK no focal spinal tenderness, no joint edema ?Neuro: non-focal, well-oriented, appropriate affect ?Breasts: Deferred  ? ?Lab Results  ?Component Value Date  ? WBC 4.2 07/27/2021  ? HGB 11.3 (L) 07/27/2021  ? HCT 35.7 (L) 07/27/2021  ? MCV 92.0 07/27/2021  ?  PLT 265 07/27/2021  ? ?Lab Results  ?Component Value Date  ? FERRITIN 363 (H) 07/13/2021  ? IRON 73 07/13/2021  ? TIBC 288 07/13/2021  ? UIBC 215 07/13/2021  ? IRONPCTSAT 25 07/13/2021  ? ?Lab Results  ?Component Value Date  ? RETICCTPCT 3.0 01/04/2021  ? RBC 3.88 07/27/2021  ? ?No results found for: KPAFRELGTCHN, LAMBDASER, KAPLAMBRATIO ?No results found for: IGGSERUM, IGA, IGMSERUM ?No results found for: TOTALPROTELP, ALBUMINELP, A1GS, A2GS, BETS, BETA2SER, GAMS, MSPIKE, SPEI ?  Chemistry   ?   ?Component Value Date/Time  ? NA 142 07/13/2021 0815  ? K 3.6 07/13/2021 0815  ? CL 104  07/13/2021 0815  ? CO2 26 07/13/2021 0815  ? BUN 6 07/13/2021 0815  ? CREATININE 0.82 07/13/2021 0815  ?    ?Component Value Date/Time  ? CALCIUM 9.4 07/13/2021 0815  ? ALKPHOS 93 07/13/2021 0815  ? AST 18 07/13/2021 0815  ? ALT 24 07/13/2021 0815  ? BILITOT 0.3 07/13/2021 0815  ?  ? ? ? ?Impression and Plan: Lauren Mcpherson is a very pleasant 56 yo African American female with metastatic 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   ?CEA pending.  ?We will proceed with treatment today as planned.  ?Follow-up in 2 weeks.  ? ?Lottie Dawson, NP ?4/5/20238:48 AM ? ?

## 2021-07-27 NOTE — Patient Instructions (Signed)

## 2021-07-29 ENCOUNTER — Inpatient Hospital Stay: Payer: PRIVATE HEALTH INSURANCE

## 2021-07-29 ENCOUNTER — Other Ambulatory Visit: Payer: Self-pay | Admitting: Family

## 2021-07-29 ENCOUNTER — Telehealth: Payer: Self-pay

## 2021-07-29 VITALS — BP 111/67 | HR 80 | Temp 98.1°F

## 2021-07-29 DIAGNOSIS — C180 Malignant neoplasm of cecum: Secondary | ICD-10-CM

## 2021-07-29 DIAGNOSIS — Z5111 Encounter for antineoplastic chemotherapy: Secondary | ICD-10-CM | POA: Diagnosis not present

## 2021-07-29 MED ORDER — PEGFILGRASTIM INJECTION 6 MG/0.6ML ~~LOC~~
6.0000 mg | PREFILLED_SYRINGE | Freq: Once | SUBCUTANEOUS | Status: AC
  Administered 2021-07-29: 6 mg via SUBCUTANEOUS
  Filled 2021-07-29: qty 0.6

## 2021-07-29 MED ORDER — HEPARIN SOD (PORK) LOCK FLUSH 100 UNIT/ML IV SOLN
250.0000 [IU] | Freq: Once | INTRAVENOUS | Status: AC | PRN
  Administered 2021-07-29: 250 [IU]

## 2021-07-29 MED ORDER — SODIUM CHLORIDE 0.9% FLUSH
10.0000 mL | INTRAVENOUS | Status: DC | PRN
  Administered 2021-07-29: 10 mL

## 2021-07-29 NOTE — Patient Instructions (Signed)

## 2021-07-29 NOTE — Telephone Encounter (Signed)
Per Lottie Dawson NP request pt was called and asked if she would be willing to take Neurontin at this time. Pt declined saying she hoped that could be a last resort for her peripheral neuropathy. Pt then educated that she can take Complex B vitamin daily to help with neuropathy. Pt educated that we can adjust the dose accordingly to how well she responds. Pt very thankful for call and had no further questions. Pt aware to start medicine today. Reviewed pt appointments with pt who verbalized understanding and had no further questions.  ?

## 2021-08-01 ENCOUNTER — Inpatient Hospital Stay: Payer: PRIVATE HEALTH INSURANCE

## 2021-08-01 VITALS — BP 118/78 | HR 98 | Resp 18

## 2021-08-01 DIAGNOSIS — C180 Malignant neoplasm of cecum: Secondary | ICD-10-CM

## 2021-08-01 DIAGNOSIS — Z5111 Encounter for antineoplastic chemotherapy: Secondary | ICD-10-CM | POA: Diagnosis not present

## 2021-08-01 MED ORDER — HEPARIN SOD (PORK) LOCK FLUSH 100 UNIT/ML IV SOLN
500.0000 [IU] | Freq: Once | INTRAVENOUS | Status: AC
  Administered 2021-08-01: 500 [IU] via INTRAVENOUS

## 2021-08-01 MED ORDER — SODIUM CHLORIDE 0.9% FLUSH
10.0000 mL | INTRAVENOUS | Status: DC | PRN
  Administered 2021-08-01: 10 mL via INTRAVENOUS

## 2021-08-03 ENCOUNTER — Inpatient Hospital Stay: Payer: PRIVATE HEALTH INSURANCE

## 2021-08-03 VITALS — BP 122/84 | HR 86 | Temp 98.1°F | Resp 17

## 2021-08-03 DIAGNOSIS — Z5111 Encounter for antineoplastic chemotherapy: Secondary | ICD-10-CM | POA: Diagnosis not present

## 2021-08-03 DIAGNOSIS — C180 Malignant neoplasm of cecum: Secondary | ICD-10-CM

## 2021-08-03 MED ORDER — SODIUM CHLORIDE 0.9% FLUSH
10.0000 mL | Freq: Once | INTRAVENOUS | Status: AC
  Administered 2021-08-03: 10 mL

## 2021-08-03 MED ORDER — HEPARIN SOD (PORK) LOCK FLUSH 100 UNIT/ML IV SOLN
250.0000 [IU] | Freq: Once | INTRAVENOUS | Status: AC
  Administered 2021-08-03: 250 [IU]

## 2021-08-03 NOTE — Patient Instructions (Signed)
PICC Home Care Guide A peripherally inserted central catheter (PICC) is a form of IV access that allows medicines and IV fluids to be quickly put into the blood and spread throughout the body. The PICC is a long, thin, flexible tube (catheter) that is put into a vein in a person's arm or leg. The catheter ends in a large vein just outside the heart called the superior vena cava (SVC). After the PICC is put in, a chest X-ray may be done to make sure that it is in the right place. A PICC may be placed for different reasons, such as: To give medicines and liquid nutrition. To give IV fluids and blood products. To take blood samples often. If there is trouble placing a peripheral intravenous (PIV) catheter. If cared for properly, a PICC can remain in place for many months. Having a PICC can allow you to go home from the hospital sooner and continue treatment at home. Medicines and PICC care can be managed at home by a family member, caregiver, or home health care team. What are the risks? Generally, having a PICC is safe. However, problems may occur, including: A blood clot (thrombus) forming in or at the end of the PICC. A blood clot forming in a vein (deep vein thrombosis) or traveling to the lung (pulmonary embolism). Inflammation of the vein (phlebitis) in which the PICC is placed. Infection at the insertion site or in the blood. Blood infections from central lines, like PICCs, can be serious and often require a hospital stay. PICC malposition, or PICC movement or poor placement. A break or cut in the PICC. Do not use scissors near the PICC. Nerve or tendon irritation or injury during PICC insertion. How to care for your PICC Please follow the specific guidelines provided by your health care provider. Preventing infection You and any caregivers should wash your hands often with soap and water for at least 20 seconds. Wash hands: Before touching the PICC or the infusion device. Before changing a  bandage (dressing). Do not change the dressing unless you have been taught to do so and have shown you are able to change it safely. Flush the PICC as told. Tell your health care provider right away if the PICC is hard to flush or does not flush. Do not use force to flush the PICC. Use clean and germ-free (sterile) supplies only. Keep the supplies in a dry place. Do not reuse needles, syringes, or any other supplies. Reusing supplies can lead to infection. Keep the PICC dressing dry and secure it with tape if the edges stop sticking to your skin. Check your PICC insertion site every day for signs of infection. Check for: Redness, swelling, or pain. Fluid or blood. Warmth. Pus or a bad smell. Preventing other problems Do not use a syringe that is less than 10 mL to flush the PICC. Do not have your blood pressure checked on the arm in which the PICC is placed. Do not ever pull or tug on the PICC. Keep it secured to your arm with tape or a stretch wrap when not in use. Do not take the PICC out yourself. Only a trained health care provider should remove the PICC. Keep pets and children away from your PICC. How to care for your PICC dressing Keep your PICC dressing clean and dry to prevent infection. Do not take baths, swim, or use a hot tub until your health care provider approves. Ask your health care provider if you can take   showers. You may only be allowed to take sponge baths. When you are allowed to shower: Ask your health care provider to teach you how to wrap the PICC. Cover the PICC with clear plastic wrap and tape to keep it dry while showering. Follow instructions from your health care provider about how to take care of your insertion site and dressing. Make sure you: Wash your hands with soap and water for at least 20 seconds before and after you change your dressing. If soap and water are not available, use hand sanitizer. Change your dressing only if taught to do so by your health care  provider. Your PICC dressing needs to be changed if it becomes loose or wet. Leave stitches (sutures), skin glue, or adhesive strips in place. These skin closures may need to stay in place for 2 weeks or longer. If adhesive strip edges start to loosen and curl up, you may trim the loose edges. Do not remove adhesive strips completely unless your health care provider tells you to do that. Follow these instructions at home: Disposal of supplies Throw away any syringes in a disposal container that is meant for sharp items (sharps container). You can buy a sharps container from a pharmacy, or you can make one by using an empty, hard plastic bottle with a lid. Place any used dressings or infusion bags into a plastic bag. Throw that bag in the trash. General instructions  Always carry your PICC identification card or wear a medical alert bracelet. Keep the tube clamped at all times, unless it is being used. Always carry a smooth-edge clamp with you to clamp the PICC if it breaks. Do not use scissors or sharp objects near the tube. You may bend your arm and move it freely. If your PICC is near or at the bend of your elbow, avoid activity with repeated motion at the elbow. Avoid lifting heavy objects as told by your health care provider. Keep all follow-up visits. This is important. You will need to have your PICC dressing changed at least once a week. Contact a health care provider if: You have pain in your arm, ear, face, or teeth. You have a fever or chills. You have redness, swelling, or pain around the insertion site. You have fluid or blood coming from the insertion site. Your insertion site feels warm to the touch. You have pus or a bad smell coming from the insertion site. Your skin feels hard and raised around the insertion site. Your PICC dressing has gotten wet or is coming off and you have not been taught how to change it. Get help right away if: You have problems with your PICC, such as  your PICC: Was tugged or pulled and has partially come out. Do not  push the PICC back in. Cannot be flushed, is hard to flush, or leaks around the insertion site when it is flushed. Makes a flushing sound when it is flushed. Appears to have a hole or tear. Is accidentally pulled all the way out. If this happens, cover the insertion site with a gauze dressing. Do not throw the PICC away. Your health care provider will need to check it to be sure the entire catheter came out. You feel your heart racing or skipping beats, or you have chest pain. You have shortness of breath or trouble breathing. You have swelling, redness, warmth, or pain in the arm in which the PICC is placed. You have a red streak going up your arm that   starts under the PICC dressing. These symptoms may be an emergency. Get help right away. Call 911. Do not wait to see if the symptoms will go away. Do not drive yourself to the hospital. Summary A peripherally inserted central catheter (PICC) is a long, thin, flexible tube (catheter) that is put into a vein in the arm or leg. If cared for properly, a PICC can remain in place for many months. Having a PICC can allow you to go home from the hospital sooner and continue treatment at home. The PICC is inserted using a germ-free (sterile) technique by a specially trained health care provider. Only a trained health care provider should remove it. Do not have your blood pressure checked on the arm in which your PICC is placed. Always keep your PICC identification card with you. This information is not intended to replace advice given to you by your health care provider. Make sure you discuss any questions you have with your health care provider. Document Revised: 10/27/2020 Document Reviewed: 10/27/2020 Elsevier Patient Education  2022 [employer].  

## 2021-08-03 NOTE — Progress Notes (Signed)
Neulasta changed to Ziextenzo per insurance formulary. ?

## 2021-08-05 ENCOUNTER — Inpatient Hospital Stay: Payer: PRIVATE HEALTH INSURANCE

## 2021-08-05 VITALS — BP 134/86 | HR 84 | Temp 98.0°F | Resp 16

## 2021-08-05 DIAGNOSIS — Z5111 Encounter for antineoplastic chemotherapy: Secondary | ICD-10-CM | POA: Diagnosis not present

## 2021-08-05 DIAGNOSIS — Z452 Encounter for adjustment and management of vascular access device: Secondary | ICD-10-CM

## 2021-08-05 MED ORDER — HEPARIN SOD (PORK) LOCK FLUSH 100 UNIT/ML IV SOLN
500.0000 [IU] | Freq: Once | INTRAVENOUS | Status: AC
  Administered 2021-08-05: 250 [IU] via INTRAVENOUS

## 2021-08-05 MED ORDER — SODIUM CHLORIDE 0.9% FLUSH
10.0000 mL | INTRAVENOUS | Status: DC | PRN
  Administered 2021-08-05: 10 mL via INTRAVENOUS

## 2021-08-05 NOTE — Patient Instructions (Signed)
PICC Home Care Guide A peripherally inserted central catheter (PICC) is a form of IV access that allows medicines and IV fluids to be quickly put into the blood and spread throughout the body. The PICC is a long, thin, flexible tube (catheter) that is put into a vein in a person's arm or leg. The catheter ends in a large vein just outside the heart called the superior vena cava (SVC). After the PICC is put in, a chest X-ray may be done to make sure that it is in the right place. A PICC may be placed for different reasons, such as: To give medicines and liquid nutrition. To give IV fluids and blood products. To take blood samples often. If there is trouble placing a peripheral intravenous (PIV) catheter. If cared for properly, a PICC can remain in place for many months. Having a PICC can allow you to go home from the hospital sooner and continue treatment at home. Medicines and PICC care can be managed at home by a family member, caregiver, or home health care team. What are the risks? Generally, having a PICC is safe. However, problems may occur, including: A blood clot (thrombus) forming in or at the end of the PICC. A blood clot forming in a vein (deep vein thrombosis) or traveling to the lung (pulmonary embolism). Inflammation of the vein (phlebitis) in which the PICC is placed. Infection at the insertion site or in the blood. Blood infections from central lines, like PICCs, can be serious and often require a hospital stay. PICC malposition, or PICC movement or poor placement. A break or cut in the PICC. Do not use scissors near the PICC. Nerve or tendon irritation or injury during PICC insertion. How to care for your PICC Please follow the specific guidelines provided by your health care provider. Preventing infection You and any caregivers should wash your hands often with soap and water for at least 20 seconds. Wash hands: Before touching the PICC or the infusion device. Before changing a  bandage (dressing). Do not change the dressing unless you have been taught to do so and have shown you are able to change it safely. Flush the PICC as told. Tell your health care provider right away if the PICC is hard to flush or does not flush. Do not use force to flush the PICC. Use clean and germ-free (sterile) supplies only. Keep the supplies in a dry place. Do not reuse needles, syringes, or any other supplies. Reusing supplies can lead to infection. Keep the PICC dressing dry and secure it with tape if the edges stop sticking to your skin. Check your PICC insertion site every day for signs of infection. Check for: Redness, swelling, or pain. Fluid or blood. Warmth. Pus or a bad smell. Preventing other problems Do not use a syringe that is less than 10 mL to flush the PICC. Do not have your blood pressure checked on the arm in which the PICC is placed. Do not ever pull or tug on the PICC. Keep it secured to your arm with tape or a stretch wrap when not in use. Do not take the PICC out yourself. Only a trained health care provider should remove the PICC. Keep pets and children away from your PICC. How to care for your PICC dressing Keep your PICC dressing clean and dry to prevent infection. Do not take baths, swim, or use a hot tub until your health care provider approves. Ask your health care provider if you can take   showers. You may only be allowed to take sponge baths. When you are allowed to shower: Ask your health care provider to teach you how to wrap the PICC. Cover the PICC with clear plastic wrap and tape to keep it dry while showering. Follow instructions from your health care provider about how to take care of your insertion site and dressing. Make sure you: Wash your hands with soap and water for at least 20 seconds before and after you change your dressing. If soap and water are not available, use hand sanitizer. Change your dressing only if taught to do so by your health care  provider. Your PICC dressing needs to be changed if it becomes loose or wet. Leave stitches (sutures), skin glue, or adhesive strips in place. These skin closures may need to stay in place for 2 weeks or longer. If adhesive strip edges start to loosen and curl up, you may trim the loose edges. Do not remove adhesive strips completely unless your health care provider tells you to do that. Follow these instructions at home: Disposal of supplies Throw away any syringes in a disposal container that is meant for sharp items (sharps container). You can buy a sharps container from a pharmacy, or you can make one by using an empty, hard plastic bottle with a lid. Place any used dressings or infusion bags into a plastic bag. Throw that bag in the trash. General instructions  Always carry your PICC identification card or wear a medical alert bracelet. Keep the tube clamped at all times, unless it is being used. Always carry a smooth-edge clamp with you to clamp the PICC if it breaks. Do not use scissors or sharp objects near the tube. You may bend your arm and move it freely. If your PICC is near or at the bend of your elbow, avoid activity with repeated motion at the elbow. Avoid lifting heavy objects as told by your health care provider. Keep all follow-up visits. This is important. You will need to have your PICC dressing changed at least once a week. Contact a health care provider if: You have pain in your arm, ear, face, or teeth. You have a fever or chills. You have redness, swelling, or pain around the insertion site. You have fluid or blood coming from the insertion site. Your insertion site feels warm to the touch. You have pus or a bad smell coming from the insertion site. Your skin feels hard and raised around the insertion site. Your PICC dressing has gotten wet or is coming off and you have not been taught how to change it. Get help right away if: You have problems with your PICC, such as  your PICC: Was tugged or pulled and has partially come out. Do not  push the PICC back in. Cannot be flushed, is hard to flush, or leaks around the insertion site when it is flushed. Makes a flushing sound when it is flushed. Appears to have a hole or tear. Is accidentally pulled all the way out. If this happens, cover the insertion site with a gauze dressing. Do not throw the PICC away. Your health care provider will need to check it to be sure the entire catheter came out. You feel your heart racing or skipping beats, or you have chest pain. You have shortness of breath or trouble breathing. You have swelling, redness, warmth, or pain in the arm in which the PICC is placed. You have a red streak going up your arm that   starts under the PICC dressing. These symptoms may be an emergency. Get help right away. Call 911. Do not wait to see if the symptoms will go away. Do not drive yourself to the hospital. Summary A peripherally inserted central catheter (PICC) is a long, thin, flexible tube (catheter) that is put into a vein in the arm or leg. If cared for properly, a PICC can remain in place for many months. Having a PICC can allow you to go home from the hospital sooner and continue treatment at home. The PICC is inserted using a germ-free (sterile) technique by a specially trained health care provider. Only a trained health care provider should remove it. Do not have your blood pressure checked on the arm in which your PICC is placed. Always keep your PICC identification card with you. This information is not intended to replace advice given to you by your health care provider. Make sure you discuss any questions you have with your health care provider. Document Revised: 10/27/2020 Document Reviewed: 10/27/2020 Elsevier Patient Education  2023 Else[employer] Inc.  

## 2021-08-08 ENCOUNTER — Inpatient Hospital Stay: Payer: PRIVATE HEALTH INSURANCE

## 2021-08-08 VITALS — BP 122/83 | HR 91 | Temp 99.0°F | Resp 18

## 2021-08-08 DIAGNOSIS — Z5111 Encounter for antineoplastic chemotherapy: Secondary | ICD-10-CM | POA: Diagnosis not present

## 2021-08-08 DIAGNOSIS — C180 Malignant neoplasm of cecum: Secondary | ICD-10-CM

## 2021-08-08 MED ORDER — SODIUM CHLORIDE 0.9% FLUSH
10.0000 mL | Freq: Once | INTRAVENOUS | Status: AC
  Administered 2021-08-08: 10 mL

## 2021-08-08 MED ORDER — HEPARIN SOD (PORK) LOCK FLUSH 100 UNIT/ML IV SOLN
250.0000 [IU] | Freq: Once | INTRAVENOUS | Status: AC
  Administered 2021-08-08: 250 [IU]

## 2021-08-08 NOTE — Patient Instructions (Signed)

## 2021-08-09 ENCOUNTER — Telehealth: Payer: Self-pay | Admitting: *Deleted

## 2021-08-09 NOTE — Telephone Encounter (Signed)
Received documents from [facility] for medical records. Sent documents to [id number] through [employer]. ?

## 2021-08-10 ENCOUNTER — Encounter: Payer: Self-pay | Admitting: Family

## 2021-08-10 ENCOUNTER — Inpatient Hospital Stay: Payer: PRIVATE HEALTH INSURANCE

## 2021-08-10 ENCOUNTER — Inpatient Hospital Stay (HOSPITAL_BASED_OUTPATIENT_CLINIC_OR_DEPARTMENT_OTHER): Payer: PRIVATE HEALTH INSURANCE | Admitting: Family

## 2021-08-10 ENCOUNTER — Other Ambulatory Visit: Payer: Self-pay | Admitting: Family

## 2021-08-10 VITALS — BP 113/62 | HR 106 | Temp 99.3°F | Resp 18 | Wt 256.0 lb

## 2021-08-10 DIAGNOSIS — I2690 Septic pulmonary embolism without acute cor pulmonale: Secondary | ICD-10-CM

## 2021-08-10 DIAGNOSIS — C180 Malignant neoplasm of cecum: Secondary | ICD-10-CM

## 2021-08-10 DIAGNOSIS — D509 Iron deficiency anemia, unspecified: Secondary | ICD-10-CM

## 2021-08-10 DIAGNOSIS — Z5111 Encounter for antineoplastic chemotherapy: Secondary | ICD-10-CM | POA: Diagnosis not present

## 2021-08-10 LAB — CMP (CANCER CENTER ONLY)
ALT: 22 U/L (ref 0–44)
AST: 17 U/L (ref 15–41)
Albumin: 3.6 g/dL (ref 3.5–5.0)
Alkaline Phosphatase: 82 U/L (ref 38–126)
Anion gap: 10 (ref 5–15)
BUN: 9 mg/dL (ref 6–20)
CO2: 27 mmol/L (ref 22–32)
Calcium: 9 mg/dL (ref 8.9–10.3)
Chloride: 104 mmol/L (ref 98–111)
Creatinine: 0.79 mg/dL (ref 0.44–1.00)
GFR, Estimated: >60 mL/min (ref >60)
Glucose, Bld: 149 mg/dL — ABNORMAL HIGH (ref 70–99)
Potassium: 3.4 mmol/L — ABNORMAL LOW (ref 3.5–5.1)
Sodium: 141 mmol/L (ref 135–145)
Total Bilirubin: 0.4 mg/dL (ref 0.3–1.2)
Total Protein: 6.3 g/dL — ABNORMAL LOW (ref 6.5–8.1)

## 2021-08-10 LAB — CBC WITH DIFFERENTIAL (CANCER CENTER ONLY)
Abs Immature Granulocytes: 0.02 10*3/uL (ref 0.00–0.07)
Basophils Absolute: 0.1 10*3/uL (ref 0.0–0.1)
Basophils Relative: 1 %
Eosinophils Absolute: 0.1 10*3/uL (ref 0.0–0.5)
Eosinophils Relative: 1 %
HCT: 33.8 % — ABNORMAL LOW (ref 36.0–46.0)
Hemoglobin: 10.8 g/dL — ABNORMAL LOW (ref 12.0–15.0)
Immature Granulocytes: 0 %
Lymphocytes Relative: 20 %
Lymphs Abs: 1.6 10*3/uL (ref 0.7–4.0)
MCH: 29.1 pg (ref 26.0–34.0)
MCHC: 32 g/dL (ref 30.0–36.0)
MCV: 91.1 fL (ref 80.0–100.0)
Monocytes Absolute: 0.8 10*3/uL (ref 0.1–1.0)
Monocytes Relative: 10 %
Neutro Abs: 5.5 10*3/uL (ref 1.7–7.7)
Neutrophils Relative %: 68 %
Platelet Count: 263 10*3/uL (ref 150–400)
RBC: 3.71 MIL/uL — ABNORMAL LOW (ref 3.87–5.11)
RDW: 16.5 % — ABNORMAL HIGH (ref 11.5–15.5)
Smear Review: NORMAL
WBC Count: 8.1 10*3/uL (ref 4.0–10.5)
nRBC: 0 % (ref 0.0–0.2)

## 2021-08-10 LAB — IRON AND IRON BINDING CAPACITY (CC-WL,HP ONLY)
Iron: 25 ug/dL — ABNORMAL LOW (ref 28–170)
Saturation Ratios: 10 % — ABNORMAL LOW (ref 10.4–31.8)
TIBC: 248 ug/dL — ABNORMAL LOW (ref 250–450)
UIBC: 223 ug/dL (ref 148–442)

## 2021-08-10 LAB — FERRITIN: Ferritin: 224 ng/mL (ref 11–307)

## 2021-08-10 LAB — CEA (IN HOUSE-CHCC): CEA (CHCC-In House): 3.8 ng/mL (ref 0.00–5.00)

## 2021-08-10 MED ORDER — SODIUM CHLORIDE 0.9 % IV SOLN
2400.0000 mg/m2 | INTRAVENOUS | Status: DC
  Administered 2021-08-10: 5750 mg via INTRAVENOUS
  Filled 2021-08-10: qty 115

## 2021-08-10 MED ORDER — SODIUM CHLORIDE 0.9 % IV SOLN
165.0000 mg/m2 | Freq: Once | INTRAVENOUS | Status: AC
  Administered 2021-08-10: 400 mg via INTRAVENOUS
  Filled 2021-08-10: qty 15

## 2021-08-10 MED ORDER — DEXTROSE 5 % IV SOLN: Freq: Once | INTRAVENOUS | Status: AC

## 2021-08-10 MED ORDER — SODIUM CHLORIDE 0.9 % IV SOLN
400.0000 mg/m2 | Freq: Once | INTRAVENOUS | Status: AC
  Administered 2021-08-10: 956 mg via INTRAVENOUS
  Filled 2021-08-10: qty 47.8

## 2021-08-10 MED ORDER — SODIUM CHLORIDE 0.9 % IV SOLN
150.0000 mg | Freq: Once | INTRAVENOUS | Status: AC
  Administered 2021-08-10: 150 mg via INTRAVENOUS
  Filled 2021-08-10: qty 150

## 2021-08-10 MED ORDER — PALONOSETRON HCL INJECTION 0.25 MG/5ML
0.2500 mg | Freq: Once | INTRAVENOUS | Status: AC
  Administered 2021-08-10: 0.25 mg via INTRAVENOUS
  Filled 2021-08-10: qty 5

## 2021-08-10 MED ORDER — SODIUM CHLORIDE 0.9 % IV SOLN
10.0000 mg | Freq: Once | INTRAVENOUS | Status: AC
  Administered 2021-08-10: 10 mg via INTRAVENOUS
  Filled 2021-08-10: qty 10

## 2021-08-10 MED ORDER — ATROPINE SULFATE 1 MG/ML IV SOLN
0.5000 mg | Freq: Once | INTRAVENOUS | Status: AC | PRN
  Administered 2021-08-10: 0.5 mg via INTRAVENOUS
  Filled 2021-08-10: qty 1

## 2021-08-10 NOTE — Patient Instructions (Signed)
[facility]  Discharge Instructions: ?Thank you for choosing [facility] to provide your oncology and hematology care.  ? ?If you have a lab appointment with the [facility], please go directly to the [facility] and check in at the registration area. ? ?Wear comfortable clothing and clothing appropriate for easy access to any Portacath or PICC line.  ? ?We strive to give you quality time with your provider. You may need to reschedule your appointment if you arrive late (15 or more minutes).  Arriving late affects you and other patients whose appointments are after yours.  Also, if you miss three or more appointments without notifying the office, you may be dismissed from the clinic at the provider?s discretion.    ?  ?For prescription refill requests, have your pharmacy contact our office and allow 72 hours for refills to be completed.   ? ?Today you received the following chemotherapy and/or immunotherapy agents FolFiri    ?  ?To help prevent nausea and vomiting after your treatment, we encourage you to take your nausea medication as directed. ? ?BELOW ARE SYMPTOMS THAT SHOULD BE REPORTED IMMEDIATELY: ?*FEVER GREATER THAN 100.4 F (38 ?C) OR HIGHER ?*CHILLS OR SWEATING ?*NAUSEA AND VOMITING THAT IS NOT CONTROLLED WITH YOUR NAUSEA MEDICATION ?*UNUSUAL SHORTNESS OF BREATH ?*UNUSUAL BRUISING OR BLEEDING ?*URINARY PROBLEMS (pain or burning when urinating, or frequent urination) ?*BOWEL PROBLEMS (unusual diarrhea, constipation, pain near the anus) ?TENDERNESS IN MOUTH AND THROAT WITH OR WITHOUT PRESENCE OF ULCERS (sore throat, sores in mouth, or a toothache) ?UNUSUAL RASH, SWELLING OR PAIN  ?UNUSUAL VAGINAL DISCHARGE OR ITCHING  ? ?Items with * indicate a potential emergency and should be followed up as soon as possible or go to the Emergency Department if any problems should occur. ? ?Please show the CHEMOTHERAPY ALERT CARD or IMMUNOTHERAPY ALERT CARD at check-in to the  Emergency Department and triage nurse. ?Should you have questions after your visit or need to cancel or reschedule your appointment, please contact [facility]  [phone] and follow the prompts.  Office hours are 8:00 a.m. to 4:30 p.m. Monday - Friday. Please note that voicemails left after 4:00 p.m. may not be returned until the following business day.  We are closed weekends and major holidays. You have access to a nurse at all times for urgent questions. Please call the main number to the clinic [phone] and follow the prompts. ? ?For any non-urgent questions, you may also contact your provider using MyChart. We now offer e-Visits for anyone 9 and older to request care online for non-urgent symptoms. For details visit [email or web address]. ?  ?Also download the MyChart app! Go to the app store, search "MyChart", open the app, select [facility], and log in with your MyChart username and password. ? ?Due to Covid, a mask is required upon entering the hospital/clinic. If you do not have a mask, one will be given to you upon arrival. For doctor visits, patients may have 1 support person aged 76 or older with them. For treatment visits, patients cannot have anyone with them due to current Covid guidelines and our immunocompromised population.  ?

## 2021-08-10 NOTE — Patient Instructions (Signed)
PICC Home Care Guide A peripherally inserted central catheter (PICC) is a form of IV access that allows medicines and IV fluids to be quickly put into the blood and spread throughout the body. The PICC is a long, thin, flexible tube (catheter) that is put into a vein in a person's arm or leg. The catheter ends in a large vein just outside the heart called the superior vena cava (SVC). After the PICC is put in, a chest X-ray may be done to make sure that it is in the right place. A PICC may be placed for different reasons, such as: To give medicines and liquid nutrition. To give IV fluids and blood products. To take blood samples often. If there is trouble placing a peripheral intravenous (PIV) catheter. If cared for properly, a PICC can remain in place for many months. Having a PICC can allow you to go home from the hospital sooner and continue treatment at home. Medicines and PICC care can be managed at home by a family member, caregiver, or home health care team. What are the risks? Generally, having a PICC is safe. However, problems may occur, including: A blood clot (thrombus) forming in or at the end of the PICC. A blood clot forming in a vein (deep vein thrombosis) or traveling to the lung (pulmonary embolism). Inflammation of the vein (phlebitis) in which the PICC is placed. Infection at the insertion site or in the blood. Blood infections from central lines, like PICCs, can be serious and often require a hospital stay. PICC malposition, or PICC movement or poor placement. A break or cut in the PICC. Do not use scissors near the PICC. Nerve or tendon irritation or injury during PICC insertion. How to care for your PICC Please follow the specific guidelines provided by your health care provider. Preventing infection You and any caregivers should wash your hands often with soap and water for at least 20 seconds. Wash hands: Before touching the PICC or the infusion device. Before changing a  bandage (dressing). Do not change the dressing unless you have been taught to do so and have shown you are able to change it safely. Flush the PICC as told. Tell your health care provider right away if the PICC is hard to flush or does not flush. Do not use force to flush the PICC. Use clean and germ-free (sterile) supplies only. Keep the supplies in a dry place. Do not reuse needles, syringes, or any other supplies. Reusing supplies can lead to infection. Keep the PICC dressing dry and secure it with tape if the edges stop sticking to your skin. Check your PICC insertion site every day for signs of infection. Check for: Redness, swelling, or pain. Fluid or blood. Warmth. Pus or a bad smell. Preventing other problems Do not use a syringe that is less than 10 mL to flush the PICC. Do not have your blood pressure checked on the arm in which the PICC is placed. Do not ever pull or tug on the PICC. Keep it secured to your arm with tape or a stretch wrap when not in use. Do not take the PICC out yourself. Only a trained health care provider should remove the PICC. Keep pets and children away from your PICC. How to care for your PICC dressing Keep your PICC dressing clean and dry to prevent infection. Do not take baths, swim, or use a hot tub until your health care provider approves. Ask your health care provider if you can take   showers. You may only be allowed to take sponge baths. When you are allowed to shower: Ask your health care provider to teach you how to wrap the PICC. Cover the PICC with clear plastic wrap and tape to keep it dry while showering. Follow instructions from your health care provider about how to take care of your insertion site and dressing. Make sure you: Wash your hands with soap and water for at least 20 seconds before and after you change your dressing. If soap and water are not available, use hand sanitizer. Change your dressing only if taught to do so by your health care  provider. Your PICC dressing needs to be changed if it becomes loose or wet. Leave stitches (sutures), skin glue, or adhesive strips in place. These skin closures may need to stay in place for 2 weeks or longer. If adhesive strip edges start to loosen and curl up, you may trim the loose edges. Do not remove adhesive strips completely unless your health care provider tells you to do that. Follow these instructions at home: Disposal of supplies Throw away any syringes in a disposal container that is meant for sharp items (sharps container). You can buy a sharps container from a pharmacy, or you can make one by using an empty, hard plastic bottle with a lid. Place any used dressings or infusion bags into a plastic bag. Throw that bag in the trash. General instructions  Always carry your PICC identification card or wear a medical alert bracelet. Keep the tube clamped at all times, unless it is being used. Always carry a smooth-edge clamp with you to clamp the PICC if it breaks. Do not use scissors or sharp objects near the tube. You may bend your arm and move it freely. If your PICC is near or at the bend of your elbow, avoid activity with repeated motion at the elbow. Avoid lifting heavy objects as told by your health care provider. Keep all follow-up visits. This is important. You will need to have your PICC dressing changed at least once a week. Contact a health care provider if: You have pain in your arm, ear, face, or teeth. You have a fever or chills. You have redness, swelling, or pain around the insertion site. You have fluid or blood coming from the insertion site. Your insertion site feels warm to the touch. You have pus or a bad smell coming from the insertion site. Your skin feels hard and raised around the insertion site. Your PICC dressing has gotten wet or is coming off and you have not been taught how to change it. Get help right away if: You have problems with your PICC, such as  your PICC: Was tugged or pulled and has partially come out. Do not  push the PICC back in. Cannot be flushed, is hard to flush, or leaks around the insertion site when it is flushed. Makes a flushing sound when it is flushed. Appears to have a hole or tear. Is accidentally pulled all the way out. If this happens, cover the insertion site with a gauze dressing. Do not throw the PICC away. Your health care provider will need to check it to be sure the entire catheter came out. You feel your heart racing or skipping beats, or you have chest pain. You have shortness of breath or trouble breathing. You have swelling, redness, warmth, or pain in the arm in which the PICC is placed. You have a red streak going up your arm that   starts under the PICC dressing. These symptoms may be an emergency. Get help right away. Call 911. Do not wait to see if the symptoms will go away. Do not drive yourself to the hospital. Summary A peripherally inserted central catheter (PICC) is a long, thin, flexible tube (catheter) that is put into a vein in the arm or leg. If cared for properly, a PICC can remain in place for many months. Having a PICC can allow you to go home from the hospital sooner and continue treatment at home. The PICC is inserted using a germ-free (sterile) technique by a specially trained health care provider. Only a trained health care provider should remove it. Do not have your blood pressure checked on the arm in which your PICC is placed. Always keep your PICC identification card with you. This information is not intended to replace advice given to you by your health care provider. Make sure you discuss any questions you have with your health care provider. Document Revised: 10/27/2020 Document Reviewed: 10/27/2020 Elsevier Patient Education  2023 Else[employer] Inc.  

## 2021-08-10 NOTE — Progress Notes (Signed)
?Hematology and Oncology Follow Up Visit ? ?Lauren Mcpherson ?[id number] ?18-Sep-1965 56 y.o. ?08/10/2021 ? ? 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  ?Current Therapy:        ?Status post cycle 12 of FOLFOXIRI/Avastin =-- D/C Avastin due to Pulmonary Embolism and DC Oxaliplatin due to Neuropathy ?Xarelto 20 mg PO daily - start on 01/22/2021, decreased to 10 mg PO started 07/26/2021 ?  ?Interim History:  Lauren Mcpherson is here today for follow-up and treatment. She is doing well and has no complaints at this time.  ?She denies fatigue.  ?She recently started taking a B complex supplement daily for the neuropathy in her hands and feet. She prefers this over gabapentin or lyrica. She has not noted a change so far and will continue taking.   ?No falls or syncope to report. She feels that her balance seems a little better lately.  ?No fever, chills, n/v, cough, rash, dizziness, SOB, chest pain, palpitations, abdominal pain or changes in bowel or bladder habits.  ?No blood loss, bruising or petechiae.  ?No swelling or tenderness in her extremities.  ?She has maintained a good appetite and is staying well hydrated. Her weight is 256 lbs.  ? ?ECOG Performance Status: 1 - Symptomatic but completely ambulatory ? ?Medications:  ?Allergies as of 08/10/2021   ?No Known Allergies ?  ? ?  ?Medication List  ?  ? ?  ? Accurate as of August 10, 2021  8:50 AM. If you have any questions, ask your nurse or doctor.  ?  ?  ? ?  ? ?dexamethasone 6 MG tablet ?Commonly known as: DECADRON ?Take 1 tablet (6 mg total) by mouth See admin instructions. Take 6 mg by mouth daily for 3 days, starting the day after chemotherapy. Take with food. ?  ?loperamide 2 MG tablet ?Commonly known as: Imodium A-D ?Take 2 at onset of diarrhea, then 1 every 2hrs until 12hr without a BM. May take 2 tab every 4hrs at bedtime. If diarrhea recurs repeat. ?  ?ondansetron 8 MG  tablet ?Commonly known as: Zofran ?Take 1 tablet (8 mg total) by mouth 2 (two) times daily as needed. Start on day 3 after chemotherapy. ?  ?prochlorperazine 10 MG tablet ?Commonly known as: COMPAZINE ?Take 1 tablet (10 mg total) by mouth every 6 (six) hours as needed (Nausea or vomiting). ?  ?rivaroxaban 10 MG Tabs tablet ?Commonly known as: XARELTO ?Take 1 tablet (10 mg total) by mouth daily with supper. ?  ? ?  ? ? ?Allergies: No Known Allergies ? ?Past Medical History, Surgical history, Social history, and Family History were reviewed and updated. ? ?Review of Systems: ?All other 10 point review of systems is negative.  ? ?Physical Exam: ? weight is 256 lb (116.1 kg). Her oral temperature is 99.3 ?F (37.4 ?C). Her blood pressure is 113/62 and her pulse is 106 (abnormal). Her respiration is 18 and oxygen saturation is 100%.  ? ?Wt Readings from Last 3 Encounters:  ?08/10/21 256 lb (116.1 kg)  ?07/27/21 259 lb (117.5 kg)  ?07/27/21 259 lb (117.5 kg)  ? ? ?Ocular: Sclerae unicteric, pupils equal, round and reactive to light ?Ear-nose-throat: Oropharynx clear, dentition fair ?Lymphatic: No cervical or supraclavicular adenopathy ?Lungs no rales or rhonchi, good excursion bilaterally ?Heart regular rate and rhythm, no murmur appreciated ?Abd soft, nontender, positive bowel sounds ?MSK no focal spinal tenderness, no joint edema ?Neuro: non-focal, well-oriented, appropriate  affect ?Breasts: Deferred  ? ?Lab Results  ?Component Value Date  ? WBC 8.1 08/10/2021  ? HGB 10.8 (L) 08/10/2021  ? HCT 33.8 (L) 08/10/2021  ? MCV 91.1 08/10/2021  ? PLT 263 08/10/2021  ? ?Lab Results  ?Component Value Date  ? FERRITIN 300 07/27/2021  ? IRON 49 07/27/2021  ? TIBC 279 07/27/2021  ? UIBC 230 07/27/2021  ? IRONPCTSAT 18 07/27/2021  ? ?Lab Results  ?Component Value Date  ? RETICCTPCT 3.0 01/04/2021  ? RBC 3.71 (L) 08/10/2021  ? ?No results found for: KPAFRELGTCHN, LAMBDASER, KAPLAMBRATIO ?No results found for: IGGSERUM, IGA,  IGMSERUM ?No results found for: TOTALPROTELP, ALBUMINELP, A1GS, A2GS, BETS, BETA2SER, GAMS, MSPIKE, SPEI ?  Chemistry   ?   ?Component Value Date/Time  ? NA 141 08/10/2021 0800  ? K 3.4 (L) 08/10/2021 0800  ? CL 104 08/10/2021 0800  ? CO2 27 08/10/2021 0800  ? BUN 9 08/10/2021 0800  ? CREATININE 0.79 08/10/2021 0800  ?    ?Component Value Date/Time  ? CALCIUM 9.0 08/10/2021 0800  ? ALKPHOS 82 08/10/2021 0800  ? AST 17 08/10/2021 0800  ? ALT 22 08/10/2021 0800  ? BILITOT 0.4 08/10/2021 0800  ?  ? ? ? ?Impression and Plan: Lauren Mcpherson is a very pleasant 56 yo African American female with metastatic 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   ?We will proceed with treatment today as planned.  ?CEA pending. Last month 3.68.  ?PICC flush 3 times a week.  ?Follow-up in 2 weeks.  ? ?Lauren Dawson, NP ?4/19/20238:50 AM ? ?

## 2021-08-12 ENCOUNTER — Other Ambulatory Visit: Payer: Self-pay

## 2021-08-12 ENCOUNTER — Inpatient Hospital Stay: Payer: PRIVATE HEALTH INSURANCE

## 2021-08-12 VITALS — BP 105/67 | HR 98 | Temp 98.2°F | Resp 18

## 2021-08-12 DIAGNOSIS — Z5111 Encounter for antineoplastic chemotherapy: Secondary | ICD-10-CM | POA: Diagnosis not present

## 2021-08-12 DIAGNOSIS — C180 Malignant neoplasm of cecum: Secondary | ICD-10-CM

## 2021-08-12 MED ORDER — PEGFILGRASTIM-BMEZ 6 MG/0.6ML ~~LOC~~ SOSY
6.0000 mg | PREFILLED_SYRINGE | Freq: Once | SUBCUTANEOUS | Status: AC
  Administered 2021-08-12: 6 mg via SUBCUTANEOUS
  Filled 2021-08-12: qty 0.6

## 2021-08-12 MED ORDER — HEPARIN SOD (PORK) LOCK FLUSH 100 UNIT/ML IV SOLN
250.0000 [IU] | Freq: Once | INTRAVENOUS | Status: AC | PRN
  Administered 2021-08-12: 250 [IU]

## 2021-08-12 MED ORDER — DEXAMETHASONE 6 MG PO TABS: 6.0000 mg | ORAL_TABLET | ORAL | 2 refills | Status: DC

## 2021-08-12 MED ORDER — SODIUM CHLORIDE 0.9 % IV SOLN
300.0000 mg | Freq: Once | INTRAVENOUS | Status: AC
  Administered 2021-08-12: 300 mg via INTRAVENOUS
  Filled 2021-08-12: qty 300

## 2021-08-12 MED ORDER — SODIUM CHLORIDE 0.9% FLUSH
10.0000 mL | INTRAVENOUS | Status: DC | PRN
  Administered 2021-08-12: 10 mL

## 2021-08-12 MED ORDER — SODIUM CHLORIDE 0.9 % IV SOLN: Freq: Once | INTRAVENOUS | Status: AC

## 2021-08-12 NOTE — Patient Instructions (Signed)

## 2021-08-15 ENCOUNTER — Other Ambulatory Visit: Payer: Self-pay | Admitting: Family

## 2021-08-15 ENCOUNTER — Inpatient Hospital Stay: Payer: PRIVATE HEALTH INSURANCE

## 2021-08-15 VITALS — BP 120/62 | HR 88 | Temp 98.2°F | Resp 18

## 2021-08-15 DIAGNOSIS — Z5111 Encounter for antineoplastic chemotherapy: Secondary | ICD-10-CM | POA: Diagnosis not present

## 2021-08-15 DIAGNOSIS — C180 Malignant neoplasm of cecum: Secondary | ICD-10-CM

## 2021-08-15 MED ORDER — SODIUM CHLORIDE 0.9% FLUSH
10.0000 mL | Freq: Once | INTRAVENOUS | Status: AC
  Administered 2021-08-15: 10 mL

## 2021-08-15 MED ORDER — HEPARIN SOD (PORK) LOCK FLUSH 100 UNIT/ML IV SOLN
500.0000 [IU] | Freq: Once | INTRAVENOUS | Status: AC
  Administered 2021-08-15: 250 [IU]

## 2021-08-15 NOTE — Patient Instructions (Signed)

## 2021-08-17 ENCOUNTER — Inpatient Hospital Stay: Payer: PRIVATE HEALTH INSURANCE

## 2021-08-17 VITALS — BP 107/85 | HR 105 | Temp 98.3°F | Resp 18

## 2021-08-17 DIAGNOSIS — C180 Malignant neoplasm of cecum: Secondary | ICD-10-CM

## 2021-08-17 DIAGNOSIS — Z5111 Encounter for antineoplastic chemotherapy: Secondary | ICD-10-CM | POA: Diagnosis not present

## 2021-08-17 MED ORDER — SODIUM CHLORIDE 0.9% FLUSH
10.0000 mL | Freq: Once | INTRAVENOUS | Status: AC
  Administered 2021-08-17: 10 mL

## 2021-08-17 MED ORDER — HEPARIN SOD (PORK) LOCK FLUSH 100 UNIT/ML IV SOLN
250.0000 [IU] | Freq: Once | INTRAVENOUS | Status: AC
  Administered 2021-08-17: 250 [IU]

## 2021-08-17 NOTE — Patient Instructions (Signed)

## 2021-08-19 ENCOUNTER — Inpatient Hospital Stay: Payer: PRIVATE HEALTH INSURANCE

## 2021-08-19 VITALS — BP 98/82 | HR 87 | Temp 98.3°F | Resp 18

## 2021-08-19 DIAGNOSIS — C180 Malignant neoplasm of cecum: Secondary | ICD-10-CM

## 2021-08-19 DIAGNOSIS — Z5111 Encounter for antineoplastic chemotherapy: Secondary | ICD-10-CM | POA: Diagnosis not present

## 2021-08-19 MED ORDER — SODIUM CHLORIDE 0.9 % IV SOLN
300.0000 mg | Freq: Once | INTRAVENOUS | Status: AC
  Administered 2021-08-19: 300 mg via INTRAVENOUS
  Filled 2021-08-19: qty 100

## 2021-08-19 MED ORDER — SODIUM CHLORIDE 0.9% FLUSH
10.0000 mL | Freq: Once | INTRAVENOUS | Status: AC
  Administered 2021-08-19: 10 mL

## 2021-08-19 MED ORDER — HEPARIN SOD (PORK) LOCK FLUSH 100 UNIT/ML IV SOLN
250.0000 [IU] | Freq: Once | INTRAVENOUS | Status: AC
  Administered 2021-08-19: 250 [IU]

## 2021-08-19 MED ORDER — SODIUM CHLORIDE 0.9 % IV SOLN: Freq: Once | INTRAVENOUS | Status: AC

## 2021-08-19 NOTE — Patient Instructions (Signed)

## 2021-08-22 ENCOUNTER — Inpatient Hospital Stay: Payer: PRIVATE HEALTH INSURANCE | Attending: Hematology & Oncology

## 2021-08-22 VITALS — BP 134/85 | HR 92 | Temp 97.8°F

## 2021-08-22 DIAGNOSIS — Z9221 Personal history of antineoplastic chemotherapy: Secondary | ICD-10-CM | POA: Diagnosis not present

## 2021-08-22 DIAGNOSIS — C180 Malignant neoplasm of cecum: Secondary | ICD-10-CM | POA: Insufficient documentation

## 2021-08-22 DIAGNOSIS — Z86711 Personal history of pulmonary embolism: Secondary | ICD-10-CM | POA: Insufficient documentation

## 2021-08-22 DIAGNOSIS — Z7901 Long term (current) use of anticoagulants: Secondary | ICD-10-CM | POA: Insufficient documentation

## 2021-08-22 DIAGNOSIS — G893 Neoplasm related pain (acute) (chronic): Secondary | ICD-10-CM | POA: Insufficient documentation

## 2021-08-22 DIAGNOSIS — Z5111 Encounter for antineoplastic chemotherapy: Secondary | ICD-10-CM | POA: Diagnosis present

## 2021-08-22 DIAGNOSIS — Z86718 Personal history of other venous thrombosis and embolism: Secondary | ICD-10-CM | POA: Insufficient documentation

## 2021-08-22 DIAGNOSIS — C787 Secondary malignant neoplasm of liver and intrahepatic bile duct: Secondary | ICD-10-CM | POA: Insufficient documentation

## 2021-08-22 DIAGNOSIS — Z452 Encounter for adjustment and management of vascular access device: Secondary | ICD-10-CM

## 2021-08-22 MED ORDER — SODIUM CHLORIDE 0.9% FLUSH
10.0000 mL | Freq: Once | INTRAVENOUS | Status: AC
  Administered 2021-08-22: 10 mL via INTRAVENOUS

## 2021-08-22 MED ORDER — HEPARIN SOD (PORK) LOCK FLUSH 100 UNIT/ML IV SOLN
500.0000 [IU] | Freq: Once | INTRAVENOUS | Status: AC
  Administered 2021-08-22: 500 [IU] via INTRAVENOUS

## 2021-08-22 NOTE — Patient Instructions (Signed)
PICC Home Care Guide A peripherally inserted central catheter (PICC) is a form of IV access that allows medicines and IV fluids to be quickly put into the blood and spread throughout the body. The PICC is a long, thin, flexible tube (catheter) that is put into a vein in a person's arm or leg. The catheter ends in a large vein just outside the heart called the superior vena cava (SVC). After the PICC is put in, a chest X-ray may be done to make sure that it is in the right place. A PICC may be placed for different reasons, such as: To give medicines and liquid nutrition. To give IV fluids and blood products. To take blood samples often. If there is trouble placing a peripheral intravenous (PIV) catheter. If cared for properly, a PICC can remain in place for many months. Having a PICC can allow you to go home from the hospital sooner and continue treatment at home. Medicines and PICC care can be managed at home by a family member, caregiver, or home health care team. What are the risks? Generally, having a PICC is safe. However, problems may occur, including: A blood clot (thrombus) forming in or at the end of the PICC. A blood clot forming in a vein (deep vein thrombosis) or traveling to the lung (pulmonary embolism). Inflammation of the vein (phlebitis) in which the PICC is placed. Infection at the insertion site or in the blood. Blood infections from central lines, like PICCs, can be serious and often require a hospital stay. PICC malposition, or PICC movement or poor placement. A break or cut in the PICC. Do not use scissors near the PICC. Nerve or tendon irritation or injury during PICC insertion. How to care for your PICC Please follow the specific guidelines provided by your health care provider. Preventing infection You and any caregivers should wash your hands often with soap and water for at least 20 seconds. Wash hands: Before touching the PICC or the infusion device. Before changing a  bandage (dressing). Do not change the dressing unless you have been taught to do so and have shown you are able to change it safely. Flush the PICC as told. Tell your health care provider right away if the PICC is hard to flush or does not flush. Do not use force to flush the PICC. Use clean and germ-free (sterile) supplies only. Keep the supplies in a dry place. Do not reuse needles, syringes, or any other supplies. Reusing supplies can lead to infection. Keep the PICC dressing dry and secure it with tape if the edges stop sticking to your skin. Check your PICC insertion site every day for signs of infection. Check for: Redness, swelling, or pain. Fluid or blood. Warmth. Pus or a bad smell. Preventing other problems Do not use a syringe that is less than 10 mL to flush the PICC. Do not have your blood pressure checked on the arm in which the PICC is placed. Do not ever pull or tug on the PICC. Keep it secured to your arm with tape or a stretch wrap when not in use. Do not take the PICC out yourself. Only a trained health care provider should remove the PICC. Keep pets and children away from your PICC. How to care for your PICC dressing Keep your PICC dressing clean and dry to prevent infection. Do not take baths, swim, or use a hot tub until your health care provider approves. Ask your health care provider if you can take   showers. You may only be allowed to take sponge baths. When you are allowed to shower: Ask your health care provider to teach you how to wrap the PICC. Cover the PICC with clear plastic wrap and tape to keep it dry while showering. Follow instructions from your health care provider about how to take care of your insertion site and dressing. Make sure you: Wash your hands with soap and water for at least 20 seconds before and after you change your dressing. If soap and water are not available, use hand sanitizer. Change your dressing only if taught to do so by your health care  provider. Your PICC dressing needs to be changed if it becomes loose or wet. Leave stitches (sutures), skin glue, or adhesive strips in place. These skin closures may need to stay in place for 2 weeks or longer. If adhesive strip edges start to loosen and curl up, you may trim the loose edges. Do not remove adhesive strips completely unless your health care provider tells you to do that. Follow these instructions at home: Disposal of supplies Throw away any syringes in a disposal container that is meant for sharp items (sharps container). You can buy a sharps container from a pharmacy, or you can make one by using an empty, hard plastic bottle with a lid. Place any used dressings or infusion bags into a plastic bag. Throw that bag in the trash. General instructions  Always carry your PICC identification card or wear a medical alert bracelet. Keep the tube clamped at all times, unless it is being used. Always carry a smooth-edge clamp with you to clamp the PICC if it breaks. Do not use scissors or sharp objects near the tube. You may bend your arm and move it freely. If your PICC is near or at the bend of your elbow, avoid activity with repeated motion at the elbow. Avoid lifting heavy objects as told by your health care provider. Keep all follow-up visits. This is important. You will need to have your PICC dressing changed at least once a week. Contact a health care provider if: You have pain in your arm, ear, face, or teeth. You have a fever or chills. You have redness, swelling, or pain around the insertion site. You have fluid or blood coming from the insertion site. Your insertion site feels warm to the touch. You have pus or a bad smell coming from the insertion site. Your skin feels hard and raised around the insertion site. Your PICC dressing has gotten wet or is coming off and you have not been taught how to change it. Get help right away if: You have problems with your PICC, such as  your PICC: Was tugged or pulled and has partially come out. Do not  push the PICC back in. Cannot be flushed, is hard to flush, or leaks around the insertion site when it is flushed. Makes a flushing sound when it is flushed. Appears to have a hole or tear. Is accidentally pulled all the way out. If this happens, cover the insertion site with a gauze dressing. Do not throw the PICC away. Your health care provider will need to check it to be sure the entire catheter came out. You feel your heart racing or skipping beats, or you have chest pain. You have shortness of breath or trouble breathing. You have swelling, redness, warmth, or pain in the arm in which the PICC is placed. You have a red streak going up your arm that   starts under the PICC dressing. These symptoms may be an emergency. Get help right away. Call 911. Do not wait to see if the symptoms will go away. Do not drive yourself to the hospital. Summary A peripherally inserted central catheter (PICC) is a long, thin, flexible tube (catheter) that is put into a vein in the arm or leg. If cared for properly, a PICC can remain in place for many months. Having a PICC can allow you to go home from the hospital sooner and continue treatment at home. The PICC is inserted using a germ-free (sterile) technique by a specially trained health care provider. Only a trained health care provider should remove it. Do not have your blood pressure checked on the arm in which your PICC is placed. Always keep your PICC identification card with you. This information is not intended to replace advice given to you by your health care provider. Make sure you discuss any questions you have with your health care provider. Document Revised: 10/27/2020 Document Reviewed: 10/27/2020 Elsevier Patient Education  2023 Else[employer] Inc.  

## 2021-08-24 ENCOUNTER — Encounter: Payer: Self-pay | Admitting: Hematology & Oncology

## 2021-08-24 ENCOUNTER — Inpatient Hospital Stay: Payer: PRIVATE HEALTH INSURANCE

## 2021-08-24 ENCOUNTER — Inpatient Hospital Stay (HOSPITAL_BASED_OUTPATIENT_CLINIC_OR_DEPARTMENT_OTHER): Payer: PRIVATE HEALTH INSURANCE | Admitting: Hematology & Oncology

## 2021-08-24 VITALS — Wt 253.0 lb

## 2021-08-24 VITALS — BP 116/73 | HR 74 | Temp 98.0°F | Resp 16

## 2021-08-24 DIAGNOSIS — C180 Malignant neoplasm of cecum: Secondary | ICD-10-CM

## 2021-08-24 DIAGNOSIS — Z5111 Encounter for antineoplastic chemotherapy: Secondary | ICD-10-CM | POA: Diagnosis not present

## 2021-08-24 LAB — CMP (CANCER CENTER ONLY)
ALT: 34 U/L (ref 0–44)
AST: 26 U/L (ref 15–41)
Albumin: 3.3 g/dL — ABNORMAL LOW (ref 3.5–5.0)
Alkaline Phosphatase: 79 U/L (ref 38–126)
Anion gap: 6 (ref 5–15)
BUN: <5 mg/dL — ABNORMAL LOW (ref 6–20)
CO2: 28 mmol/L (ref 22–32)
Calcium: 8.7 mg/dL — ABNORMAL LOW (ref 8.9–10.3)
Chloride: 107 mmol/L (ref 98–111)
Creatinine: 0.68 mg/dL (ref 0.44–1.00)
GFR, Estimated: >60 mL/min (ref >60)
Glucose, Bld: 107 mg/dL — ABNORMAL HIGH (ref 70–99)
Potassium: 3.2 mmol/L — ABNORMAL LOW (ref 3.5–5.1)
Sodium: 141 mmol/L (ref 135–145)
Total Bilirubin: 0.5 mg/dL (ref 0.3–1.2)
Total Protein: 6.4 g/dL — ABNORMAL LOW (ref 6.5–8.1)

## 2021-08-24 LAB — CBC WITH DIFFERENTIAL (CANCER CENTER ONLY)
Abs Immature Granulocytes: 0.06 10*3/uL (ref 0.00–0.07)
Basophils Absolute: 0.1 10*3/uL (ref 0.0–0.1)
Basophils Relative: 1 %
Eosinophils Absolute: 0.1 10*3/uL (ref 0.0–0.5)
Eosinophils Relative: 1 %
HCT: 35.2 % — ABNORMAL LOW (ref 36.0–46.0)
Hemoglobin: 11.1 g/dL — ABNORMAL LOW (ref 12.0–15.0)
Immature Granulocytes: 1 %
Lymphocytes Relative: 37 %
Lymphs Abs: 2.1 10*3/uL (ref 0.7–4.0)
MCH: 28.5 pg (ref 26.0–34.0)
MCHC: 31.5 g/dL (ref 30.0–36.0)
MCV: 90.3 fL (ref 80.0–100.0)
Monocytes Absolute: 0.6 10*3/uL (ref 0.1–1.0)
Monocytes Relative: 11 %
Neutro Abs: 2.6 10*3/uL (ref 1.7–7.7)
Neutrophils Relative %: 49 %
Platelet Count: 274 10*3/uL (ref 150–400)
RBC: 3.9 MIL/uL (ref 3.87–5.11)
RDW: 17.6 % — ABNORMAL HIGH (ref 11.5–15.5)
Smear Review: NORMAL
WBC Count: 5.5 10*3/uL (ref 4.0–10.5)
nRBC: 0 % (ref 0.0–0.2)

## 2021-08-24 MED ORDER — SODIUM CHLORIDE 0.9 % IV SOLN
2400.0000 mg/m2 | INTRAVENOUS | Status: DC
  Administered 2021-08-24: 5750 mg via INTRAVENOUS
  Filled 2021-08-24: qty 100

## 2021-08-24 MED ORDER — DEXTROSE 5 % IV SOLN: Freq: Once | INTRAVENOUS | Status: DC

## 2021-08-24 MED ORDER — SODIUM CHLORIDE 0.9 % IV SOLN
400.0000 mg/m2 | Freq: Once | INTRAVENOUS | Status: AC
  Administered 2021-08-24: 956 mg via INTRAVENOUS
  Filled 2021-08-24: qty 47.8

## 2021-08-24 MED ORDER — SODIUM CHLORIDE 0.9% FLUSH: 10.0000 mL | INTRAVENOUS | Status: DC | PRN

## 2021-08-24 MED ORDER — SODIUM CHLORIDE 0.9 % IV SOLN: Freq: Once | INTRAVENOUS | Status: AC

## 2021-08-24 MED ORDER — ATROPINE SULFATE 1 MG/ML IV SOLN
0.5000 mg | Freq: Once | INTRAVENOUS | Status: AC | PRN
  Administered 2021-08-24: 0.5 mg via INTRAVENOUS
  Filled 2021-08-24: qty 1

## 2021-08-24 MED ORDER — PALONOSETRON HCL INJECTION 0.25 MG/5ML
0.2500 mg | Freq: Once | INTRAVENOUS | Status: AC
  Administered 2021-08-24: 0.25 mg via INTRAVENOUS
  Filled 2021-08-24: qty 5

## 2021-08-24 MED ORDER — HEPARIN SOD (PORK) LOCK FLUSH 100 UNIT/ML IV SOLN: 250.0000 [IU] | Freq: Once | INTRAVENOUS | Status: DC | PRN

## 2021-08-24 MED ORDER — SODIUM CHLORIDE 0.9 % IV SOLN
150.0000 mg | Freq: Once | INTRAVENOUS | Status: AC
  Administered 2021-08-24: 150 mg via INTRAVENOUS
  Filled 2021-08-24: qty 150

## 2021-08-24 MED ORDER — SODIUM CHLORIDE 0.9 % IV SOLN
300.0000 mg | Freq: Once | INTRAVENOUS | Status: AC
  Administered 2021-08-24: 300 mg via INTRAVENOUS
  Filled 2021-08-24: qty 200

## 2021-08-24 MED ORDER — SODIUM CHLORIDE 0.9 % IV SOLN
165.0000 mg/m2 | Freq: Once | INTRAVENOUS | Status: AC
  Administered 2021-08-24: 400 mg via INTRAVENOUS
  Filled 2021-08-24: qty 15

## 2021-08-24 MED ORDER — SODIUM CHLORIDE 0.9 % IV SOLN
10.0000 mg | Freq: Once | INTRAVENOUS | Status: AC
  Administered 2021-08-24: 10 mg via INTRAVENOUS
  Filled 2021-08-24: qty 10

## 2021-08-24 NOTE — Patient Instructions (Signed)
[facility]  Discharge Instructions: ?Thank you for choosing [facility] to provide your oncology and hematology care.  ? ?If you have a lab appointment with the [facility], please go directly to the [facility] and check in at the registration area. ? ?Wear comfortable clothing and clothing appropriate for easy access to any Portacath or PICC line.  ? ?We strive to give you quality time with your provider. You may need to reschedule your appointment if you arrive late (15 or more minutes).  Arriving late affects you and other patients whose appointments are after yours.  Also, if you miss three or more appointments without notifying the office, you may be dismissed from the clinic at the provider?s discretion.    ?  ?For prescription refill requests, have your pharmacy contact our office and allow 72 hours for refills to be completed.   ? ?Today you received the following chemotherapy and/or immunotherapy agents FOLFOX    ?  ?To help prevent nausea and vomiting after your treatment, we encourage you to take your nausea medication as directed. ? ?BELOW ARE SYMPTOMS THAT SHOULD BE REPORTED IMMEDIATELY: ?*FEVER GREATER THAN 100.4 F (38 ?C) OR HIGHER ?*CHILLS OR SWEATING ?*NAUSEA AND VOMITING THAT IS NOT CONTROLLED WITH YOUR NAUSEA MEDICATION ?*UNUSUAL SHORTNESS OF BREATH ?*UNUSUAL BRUISING OR BLEEDING ?*URINARY PROBLEMS (pain or burning when urinating, or frequent urination) ?*BOWEL PROBLEMS (unusual diarrhea, constipation, pain near the anus) ?TENDERNESS IN MOUTH AND THROAT WITH OR WITHOUT PRESENCE OF ULCERS (sore throat, sores in mouth, or a toothache) ?UNUSUAL RASH, SWELLING OR PAIN  ?UNUSUAL VAGINAL DISCHARGE OR ITCHING  ? ?Items with * indicate a potential emergency and should be followed up as soon as possible or go to the Emergency Department if any problems should occur. ? ?Please show the CHEMOTHERAPY ALERT CARD or IMMUNOTHERAPY ALERT CARD at check-in to the  Emergency Department and triage nurse. ?Should you have questions after your visit or need to cancel or reschedule your appointment, please contact [facility]  [phone] and follow the prompts.  Office hours are 8:00 a.m. to 4:30 p.m. Monday - Friday. Please note that voicemails left after 4:00 p.m. may not be returned until the following business day.  We are closed weekends and major holidays. You have access to a nurse at all times for urgent questions. Please call the main number to the clinic [phone] and follow the prompts. ? ?For any non-urgent questions, you may also contact your provider using MyChart. We now offer e-Visits for anyone 55 and older to request care online for non-urgent symptoms. For details visit [email or web address]. ?  ?Also download the MyChart app! Go to the app store, search "MyChart", open the app, select [facility], and log in with your MyChart username and password. ? ?Due to Covid, a mask is required upon entering the hospital/clinic. If you do not have a mask, one will be given to you upon arrival. For doctor visits, patients may have 1 support person aged 17 or older with them. For treatment visits, patients cannot have anyone with them due to current Covid guidelines and our immunocompromised population.  ?

## 2021-08-24 NOTE — Progress Notes (Signed)
Hold Xgeva for now per MD. ? ?Acquanetta Belling, [place], BCPS, BCOP ?08/24/2021 ?10:16 AM ? ?

## 2021-08-24 NOTE — Progress Notes (Signed)
?Hematology and Oncology Follow Up Visit ? ?Lauren Mcpherson ?[id number] ?04/30/1965 56 y.o. ?08/24/2021 ? ? 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  ?Current Therapy:        ?Status post cycle 12 of FOLFOXIRI/Avastin =-- D/C Avastin due to Pulmonary Embolism and DC Oxaliplatin due to Neuropathy ?Xarelto 20 mg PO daily - start on 01/22/2021, decreased to 10 mg PO started 07/26/2021 ?  ?Interim History:  Lauren Mcpherson is here today for follow-up and treatment.  She seems to be doing pretty well.  We stopped the oxaliplatin.  This was back in January.  She still has some neuropathy.  She is taking complex B vitamins.  We will try her on some pyridoxine to see if this may help. ? ?Her last CEA level was 3.8. ? ?She has had no problems with abdominal pain.  She is on Xarelto for history of pulmonary embolism.  She has had no issues with bleeding. ? ?She has had no problems with nausea or vomiting.  She is eating okay.  There is no diarrhea.  She has had no leg swelling.  She has had no rashes. ? ?Overall, I would say her performance status is probably ECOG 1.   ? ? ?Medications:  ?Allergies as of 08/24/2021   ?No Known Allergies ?  ? ?  ?Medication List  ?  ? ?  ? Accurate as of Aug 24, 2021  8:55 AM. If you have any questions, ask your nurse or doctor.  ?  ?  ? ?  ? ?dexamethasone 6 MG tablet ?Commonly known as: DECADRON ?Take 1 tablet (6 mg total) by mouth See admin instructions. Take 6 mg by mouth daily for 3 days, starting the day after chemotherapy. Take with food. ?  ?loperamide 2 MG tablet ?Commonly known as: Imodium A-D ?Take 2 at onset of diarrhea, then 1 every 2hrs until 12hr without a BM. May take 2 tab every 4hrs at bedtime. If diarrhea recurs repeat. ?  ?ondansetron 8 MG tablet ?Commonly known as: Zofran ?Take 1 tablet (8 mg total) by mouth 2 (two) times daily as needed. Start on day 3 after chemotherapy. ?   ?prochlorperazine 10 MG tablet ?Commonly known as: COMPAZINE ?Take 1 tablet (10 mg total) by mouth every 6 (six) hours as needed (Nausea or vomiting). ?  ?rivaroxaban 10 MG Tabs tablet ?Commonly known as: XARELTO ?Take 1 tablet (10 mg total) by mouth daily with supper. ?  ? ?  ? ? ?Allergies: No Known Allergies ? ?Past Medical History, Surgical history, Social history, and Family History were reviewed and updated. ? ?Review of Systems: ?Review of Systems  ?Constitutional: Negative.   ?HENT: Negative.    ?Eyes: Negative.   ?Respiratory: Negative.    ?Cardiovascular: Negative.   ?Gastrointestinal: Negative.   ?Genitourinary: Negative.   ?Musculoskeletal: Negative.   ?Skin: Negative.   ?Neurological:  Positive for tingling.  ?Endo/Heme/Allergies: Negative.   ?Psychiatric/Behavioral: Negative.    ? ? ?Physical Exam: ? weight is 253 lb (114.8 kg).  ? ?Wt Readings from Last 3 Encounters:  ?08/24/21 253 lb (114.8 kg)  ?08/10/21 256 lb (116.1 kg)  ?07/27/21 259 lb (117.5 kg)  ? ?Her vital signs show temperature of 98.3.  Pulse 75.  Blood pressure 116/83.  Weight is 253 pounds. ? ?Physical Exam ?Vitals reviewed.  ?HENT:  ?   Head: Normocephalic and atraumatic.  ?Eyes:  ?   Pupils: Pupils  are equal, round, and reactive to light.  ?Cardiovascular:  ?   Rate and Rhythm: Normal rate and regular rhythm.  ?   Heart sounds: Normal heart sounds.  ?Pulmonary:  ?   Effort: Pulmonary effort is normal.  ?   Breath sounds: Normal breath sounds.  ?Abdominal:  ?   General: Bowel sounds are normal.  ?   Palpations: Abdomen is soft.  ?Musculoskeletal:     ?   General: No tenderness or deformity. Normal range of motion.  ?   Cervical back: Normal range of motion.  ?Lymphadenopathy:  ?   Cervical: No cervical adenopathy.  ?Skin: ?   General: Skin is warm and dry.  ?   Findings: No erythema or rash.  ?Neurological:  ?   Mental Status: She is alert and oriented to person, place, and time.  ?Psychiatric:     ?   Behavior: Behavior normal.     ?    Thought Content: Thought content normal.     ?   Judgment: Judgment normal.  ? ? ? ? ?Lab Results  ?Component Value Date  ? WBC 5.5 08/24/2021  ? HGB 11.1 (L) 08/24/2021  ? HCT 35.2 (L) 08/24/2021  ? MCV 90.3 08/24/2021  ? PLT 274 08/24/2021  ? ?Lab Results  ?Component Value Date  ? FERRITIN 224 08/10/2021  ? IRON 25 (L) 08/10/2021  ? TIBC 248 (L) 08/10/2021  ? UIBC 223 08/10/2021  ? IRONPCTSAT 10 (L) 08/10/2021  ? ?Lab Results  ?Component Value Date  ? RETICCTPCT 3.0 01/04/2021  ? RBC 3.90 08/24/2021  ? ?No results found for: KPAFRELGTCHN, LAMBDASER, KAPLAMBRATIO ?No results found for: IGGSERUM, IGA, IGMSERUM ?No results found for: TOTALPROTELP, ALBUMINELP, A1GS, A2GS, BETS, BETA2SER, GAMS, MSPIKE, SPEI ?  Chemistry   ?   ?Component Value Date/Time  ? NA 141 08/10/2021 0800  ? K 3.4 (L) 08/10/2021 0800  ? CL 104 08/10/2021 0800  ? CO2 27 08/10/2021 0800  ? BUN 9 08/10/2021 0800  ? CREATININE 0.79 08/10/2021 0800  ?    ?Component Value Date/Time  ? CALCIUM 9.0 08/10/2021 0800  ? ALKPHOS 82 08/10/2021 0800  ? AST 17 08/10/2021 0800  ? ALT 22 08/10/2021 0800  ? BILITOT 0.4 08/10/2021 0800  ?  ? ? ? ?Impression and Plan:  ? ?Lauren Mcpherson is a very pleasant 56 yo [place] American female with metastatic 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   ? ?So far, she is doing quite well.  I just am happy that she is doing well.  Hopefully, the neuropathy will improve. ? ?We will go ahead with treatment today. ? ?We probably have to do some scans on her in June. ? ?I will plan to have her come back in another couple weeks for her next cycle. ? ? ?[name], MD ?5/3/20238:55 AM ? ?

## 2021-08-24 NOTE — Patient Instructions (Signed)
PICC Insertion, Care After ?The following information offers guidance on how to care for yourself after your procedure. Your health care provider may also give you more specific instructions. If you have problems or questions, contact your health care provider. ?What can I expect after the procedure? ?After the procedure, it is common to have mild discomfort and bruising at your insertion site. ?Follow these instructions at home: ?Bathing ? ?Do not take baths, swim, or use a hot tub until your health care provider approves. Ask your health care provider if you may take showers. You may only be allowed to take sponge baths. ?Keep the bandage (dressing) dry. If it gets wet, it needs to be changed right away to help prevent infection. ?Activity ? ?Rest as told by your health care provider. Ask your health care provider when you can return to your normal activities. ?If your PICC is near or at the bend of your elbow, avoid activity with repeated motion at the elbow. ?Do not lift anything that is heavier than 10 lb (4.5 kg), or the limit that you are told, until your health care provider says that it is safe. ?If you use a crutch, do not use it with the arm on the same side as your PICC. You may need to use a walker instead. ?Avoid any activity that requires great effort as told by your health care provider. ?General instructions ?If you were given a sedative during the procedure, it can affect you for several hours. Do not drive or operate machinery until your health care provider says that it is safe. ?Take over-the-counter and prescription medicines only as told by your health care provider. ?Flush the PICC as told by your health care provider. Let your health care provider know right away if the PICC is hard to flush or does not flush. Do not use force to flush the PICC. ?Do not use any products that contain nicotine or tobacco. These products include cigarettes, chewing tobacco, and vaping devices, such as  e-cigarettes. These can delay healing after surgery. If you need help quitting, ask your health care provider. ?Check your insertion site every day for signs of infection. Check for: ?More redness, swelling, or pain. ?Fluid or blood. ?Warmth. ?Pus or a bad smell. ?Keep all follow-up visits. This is important. You will need to have your PICC dressing changed at least once a week. ?Contact a health care provider if: ?You have pain in your arm, ear, face, or teeth. ?You have a fever or chills. ?You have more redness, swelling, or pain around your insertion site. ?You have fluid or blood coming from your insertion site. ?Your insertion site feels warm to the touch. ?You have pus or a bad smell coming from your insertion site. ?Your vein feels hard and raised like a cord under the skin around your insertion site. ?Your PICC dressing has gotten wet. ?Your PICC dressing is coming off. Tape it on until it can be changed. ?Get help right away if: ?You have problems with the PICC. These include if your PICC: ?Is accidentally pulled all the way out. If this happens, cover the insertion site with a gauze dressing. Do not throw the PICC away. Your health care provider will need to check it to be sure the entire catheter came out. ?Cannot be flushed, is hard to flush, or leaks around the insertion site when flushed. ?Was tugged or pulled and has partially come out. Do not push the PICC back in. ?Makes a flushing sound when  flushed. ?Appears to have a hole or tear. ?You feel your heart racing or skipping beats, or you have chest pain. ?You have swelling, redness, warmth, or pain in the arm or leg where the PICC is placed. ?You have a red streak going up your arm that starts under your PICC dressing. ?You have numbness or tingling in your fingers, hand, or arm. ?These symptoms may be an emergency. Get help right away. Call 911. ?Do not wait to see if the symptoms will go away. ?Do not drive yourself to the hospital. ?Summary ?After  your procedure, it is common to have mild discomfort and bruising at your insertion site. ?Do not lift anything that is heavier than 10 lb (4.5 kg), or the limit that you are told, until your health care provider says that it is safe. ?Flush the PICC as told by your health care provider. Tell your health care provider right away if the PICC is hard to flush or does not flush. Do not use force to flush the PICC. ?Check your insertion site every day for signs of infection. ?This information is not intended to replace advice given to you by your health care provider. Make sure you discuss any questions you have with your health care provider. ?Document Revised: 10/27/2020 Document Reviewed: 10/27/2020 ?Elsevier Patient Education ? [place]. ? ?

## 2021-08-26 ENCOUNTER — Inpatient Hospital Stay: Payer: PRIVATE HEALTH INSURANCE

## 2021-08-26 VITALS — BP 91/74 | HR 100 | Temp 97.9°F | Resp 17

## 2021-08-26 DIAGNOSIS — Z5111 Encounter for antineoplastic chemotherapy: Secondary | ICD-10-CM | POA: Diagnosis not present

## 2021-08-26 DIAGNOSIS — C180 Malignant neoplasm of cecum: Secondary | ICD-10-CM

## 2021-08-26 MED ORDER — SODIUM CHLORIDE 0.9% FLUSH
10.0000 mL | INTRAVENOUS | Status: DC | PRN
  Administered 2021-08-26: 10 mL

## 2021-08-26 MED ORDER — HEPARIN SOD (PORK) LOCK FLUSH 100 UNIT/ML IV SOLN
500.0000 [IU] | Freq: Once | INTRAVENOUS | Status: AC | PRN
  Administered 2021-08-26: 250 [IU]

## 2021-08-26 MED ORDER — HEPARIN SOD (PORK) LOCK FLUSH 100 UNIT/ML IV SOLN
250.0000 [IU] | Freq: Once | INTRAVENOUS | Status: AC | PRN
  Administered 2021-08-26: 250 [IU]

## 2021-08-26 MED ORDER — PEGFILGRASTIM-BMEZ 6 MG/0.6ML ~~LOC~~ SOSY
6.0000 mg | PREFILLED_SYRINGE | Freq: Once | SUBCUTANEOUS | Status: AC
  Administered 2021-08-26: 6 mg via SUBCUTANEOUS
  Filled 2021-08-26: qty 0.6

## 2021-08-26 NOTE — Patient Instructions (Signed)

## 2021-08-29 ENCOUNTER — Inpatient Hospital Stay: Payer: PRIVATE HEALTH INSURANCE

## 2021-08-29 VITALS — BP 113/76 | HR 97 | Temp 98.5°F | Resp 17

## 2021-08-29 DIAGNOSIS — Z5111 Encounter for antineoplastic chemotherapy: Secondary | ICD-10-CM | POA: Diagnosis not present

## 2021-08-29 DIAGNOSIS — C180 Malignant neoplasm of cecum: Secondary | ICD-10-CM

## 2021-08-29 MED ORDER — HEPARIN SOD (PORK) LOCK FLUSH 100 UNIT/ML IV SOLN
250.0000 [IU] | Freq: Once | INTRAVENOUS | Status: AC
  Administered 2021-08-29: 250 [IU]

## 2021-08-29 MED ORDER — SODIUM CHLORIDE 0.9% FLUSH
10.0000 mL | Freq: Once | INTRAVENOUS | Status: AC
  Administered 2021-08-29: 10 mL

## 2021-08-31 ENCOUNTER — Inpatient Hospital Stay: Payer: PRIVATE HEALTH INSURANCE

## 2021-08-31 VITALS — BP 118/69 | HR 98 | Temp 98.3°F | Resp 17

## 2021-08-31 DIAGNOSIS — Z5111 Encounter for antineoplastic chemotherapy: Secondary | ICD-10-CM | POA: Diagnosis not present

## 2021-08-31 DIAGNOSIS — C180 Malignant neoplasm of cecum: Secondary | ICD-10-CM

## 2021-08-31 MED ORDER — SODIUM CHLORIDE 0.9% FLUSH
10.0000 mL | Freq: Once | INTRAVENOUS | Status: AC
  Administered 2021-08-31: 10 mL

## 2021-08-31 MED ORDER — HEPARIN SOD (PORK) LOCK FLUSH 100 UNIT/ML IV SOLN
250.0000 [IU] | Freq: Once | INTRAVENOUS | Status: AC
  Administered 2021-08-31: 250 [IU]

## 2021-09-02 ENCOUNTER — Inpatient Hospital Stay: Payer: PRIVATE HEALTH INSURANCE

## 2021-09-02 VITALS — BP 124/88 | HR 88 | Temp 98.7°F | Resp 18

## 2021-09-02 DIAGNOSIS — Z452 Encounter for adjustment and management of vascular access device: Secondary | ICD-10-CM

## 2021-09-02 DIAGNOSIS — Z5111 Encounter for antineoplastic chemotherapy: Secondary | ICD-10-CM | POA: Diagnosis not present

## 2021-09-02 MED ORDER — HEPARIN SOD (PORK) LOCK FLUSH 100 UNIT/ML IV SOLN
250.0000 [IU] | Freq: Once | INTRAVENOUS | Status: AC
  Administered 2021-09-02: 250 [IU] via INTRAVENOUS

## 2021-09-02 MED ORDER — SODIUM CHLORIDE 0.9% FLUSH
10.0000 mL | Freq: Once | INTRAVENOUS | Status: AC
  Administered 2021-09-02: 10 mL via INTRAVENOUS

## 2021-09-02 NOTE — Patient Instructions (Signed)

## 2021-09-02 NOTE — Addendum Note (Signed)
Addended by: Amelia Jo I on: 09/02/2021 11:39 AM ? ? Modules accepted: Orders ? ?

## 2021-09-05 ENCOUNTER — Inpatient Hospital Stay: Payer: PRIVATE HEALTH INSURANCE

## 2021-09-05 DIAGNOSIS — Z5111 Encounter for antineoplastic chemotherapy: Secondary | ICD-10-CM | POA: Diagnosis not present

## 2021-09-05 DIAGNOSIS — Z452 Encounter for adjustment and management of vascular access device: Secondary | ICD-10-CM

## 2021-09-05 MED ORDER — SODIUM CHLORIDE 0.9% FLUSH
10.0000 mL | Freq: Once | INTRAVENOUS | Status: AC
  Administered 2021-09-05: 10 mL via INTRAVENOUS

## 2021-09-05 MED ORDER — HEPARIN SOD (PORK) LOCK FLUSH 100 UNIT/ML IV SOLN
500.0000 [IU] | Freq: Once | INTRAVENOUS | Status: AC
  Administered 2021-09-05: 500 [IU] via INTRAVENOUS

## 2021-09-05 NOTE — Patient Instructions (Signed)

## 2021-09-07 ENCOUNTER — Other Ambulatory Visit: Payer: PRIVATE HEALTH INSURANCE

## 2021-09-07 ENCOUNTER — Inpatient Hospital Stay: Payer: PRIVATE HEALTH INSURANCE

## 2021-09-07 ENCOUNTER — Inpatient Hospital Stay (HOSPITAL_BASED_OUTPATIENT_CLINIC_OR_DEPARTMENT_OTHER): Payer: PRIVATE HEALTH INSURANCE | Admitting: Family

## 2021-09-07 ENCOUNTER — Ambulatory Visit: Payer: PRIVATE HEALTH INSURANCE

## 2021-09-07 ENCOUNTER — Encounter: Payer: Self-pay | Admitting: Family

## 2021-09-07 VITALS — BP 111/73 | HR 88 | Temp 98.7°F | Resp 16 | Wt 254.0 lb

## 2021-09-07 DIAGNOSIS — I2690 Septic pulmonary embolism without acute cor pulmonale: Secondary | ICD-10-CM | POA: Diagnosis not present

## 2021-09-07 DIAGNOSIS — D509 Iron deficiency anemia, unspecified: Secondary | ICD-10-CM | POA: Diagnosis not present

## 2021-09-07 DIAGNOSIS — C180 Malignant neoplasm of cecum: Secondary | ICD-10-CM

## 2021-09-07 DIAGNOSIS — Z5111 Encounter for antineoplastic chemotherapy: Secondary | ICD-10-CM | POA: Diagnosis not present

## 2021-09-07 LAB — CBC WITH DIFFERENTIAL (CANCER CENTER ONLY)
Abs Immature Granulocytes: 0.02 10*3/uL (ref 0.00–0.07)
Basophils Absolute: 0.1 10*3/uL (ref 0.0–0.1)
Basophils Relative: 1 %
Eosinophils Absolute: 0.1 10*3/uL (ref 0.0–0.5)
Eosinophils Relative: 2 %
HCT: 35.7 % — ABNORMAL LOW (ref 36.0–46.0)
Hemoglobin: 11.1 g/dL — ABNORMAL LOW (ref 12.0–15.0)
Immature Granulocytes: 1 %
Lymphocytes Relative: 46 %
Lymphs Abs: 1.8 10*3/uL (ref 0.7–4.0)
MCH: 29.1 pg (ref 26.0–34.0)
MCHC: 31.1 g/dL (ref 30.0–36.0)
MCV: 93.7 fL (ref 80.0–100.0)
Monocytes Absolute: 0.5 10*3/uL (ref 0.1–1.0)
Monocytes Relative: 14 %
Neutro Abs: 1.3 10*3/uL — ABNORMAL LOW (ref 1.7–7.7)
Neutrophils Relative %: 36 %
Platelet Count: 241 10*3/uL (ref 150–400)
RBC: 3.81 MIL/uL — ABNORMAL LOW (ref 3.87–5.11)
RDW: 19 % — ABNORMAL HIGH (ref 11.5–15.5)
WBC Count: 3.8 10*3/uL — ABNORMAL LOW (ref 4.0–10.5)
nRBC: 0 % (ref 0.0–0.2)

## 2021-09-07 LAB — LACTATE DEHYDROGENASE: LDH: 151 U/L (ref 98–192)

## 2021-09-07 LAB — CMP (CANCER CENTER ONLY)
ALT: 24 U/L (ref 0–44)
AST: 17 U/L (ref 15–41)
Albumin: 3.6 g/dL (ref 3.5–5.0)
Alkaline Phosphatase: 70 U/L (ref 38–126)
Anion gap: 8 (ref 5–15)
BUN: 8 mg/dL (ref 6–20)
CO2: 29 mmol/L (ref 22–32)
Calcium: 9 mg/dL (ref 8.9–10.3)
Chloride: 106 mmol/L (ref 98–111)
Creatinine: 0.77 mg/dL (ref 0.44–1.00)
GFR, Estimated: >60 mL/min (ref >60)
Glucose, Bld: 123 mg/dL — ABNORMAL HIGH (ref 70–99)
Potassium: 3.2 mmol/L — ABNORMAL LOW (ref 3.5–5.1)
Sodium: 143 mmol/L (ref 135–145)
Total Bilirubin: 0.3 mg/dL (ref 0.3–1.2)
Total Protein: 6.1 g/dL — ABNORMAL LOW (ref 6.5–8.1)

## 2021-09-07 LAB — IRON AND IRON BINDING CAPACITY (CC-WL,HP ONLY)
Iron: 65 ug/dL (ref 28–170)
Saturation Ratios: 31 % (ref 10.4–31.8)
TIBC: 213 ug/dL — ABNORMAL LOW (ref 250–450)
UIBC: 148 ug/dL (ref 148–442)

## 2021-09-07 LAB — CEA (IN HOUSE-CHCC): CEA (CHCC-In House): 4.42 ng/mL (ref 0.00–5.00)

## 2021-09-07 LAB — FERRITIN: Ferritin: 834 ng/mL — ABNORMAL HIGH (ref 11–307)

## 2021-09-07 MED ORDER — SODIUM CHLORIDE 0.9 % IV SOLN: Freq: Once | INTRAVENOUS | Status: AC

## 2021-09-07 MED ORDER — SODIUM CHLORIDE 0.9 % IV SOLN
10.0000 mg | Freq: Once | INTRAVENOUS | Status: AC
  Administered 2021-09-07: 10 mg via INTRAVENOUS
  Filled 2021-09-07: qty 10

## 2021-09-07 MED ORDER — SODIUM CHLORIDE 0.9 % IV SOLN
2400.0000 mg/m2 | INTRAVENOUS | Status: DC
  Administered 2021-09-07: 5750 mg via INTRAVENOUS
  Filled 2021-09-07: qty 115

## 2021-09-07 MED ORDER — PALONOSETRON HCL INJECTION 0.25 MG/5ML
0.2500 mg | Freq: Once | INTRAVENOUS | Status: AC
  Administered 2021-09-07: 0.25 mg via INTRAVENOUS
  Filled 2021-09-07: qty 5

## 2021-09-07 MED ORDER — SODIUM CHLORIDE 0.9 % IV SOLN
400.0000 mg/m2 | Freq: Once | INTRAVENOUS | Status: AC
  Administered 2021-09-07: 956 mg via INTRAVENOUS
  Filled 2021-09-07: qty 47.8

## 2021-09-07 MED ORDER — SODIUM CHLORIDE 0.9 % IV SOLN
165.0000 mg/m2 | Freq: Once | INTRAVENOUS | Status: AC
  Administered 2021-09-07: 400 mg via INTRAVENOUS
  Filled 2021-09-07: qty 5

## 2021-09-07 MED ORDER — ATROPINE SULFATE 1 MG/ML IV SOLN
0.5000 mg | Freq: Once | INTRAVENOUS | Status: AC | PRN
  Administered 2021-09-07: 0.5 mg via INTRAVENOUS
  Filled 2021-09-07: qty 1

## 2021-09-07 MED ORDER — SODIUM CHLORIDE 0.9 % IV SOLN
150.0000 mg | Freq: Once | INTRAVENOUS | Status: AC
  Administered 2021-09-07: 150 mg via INTRAVENOUS
  Filled 2021-09-07: qty 150

## 2021-09-07 NOTE — Progress Notes (Signed)
Ok to treat with ANC of 1.3 per Judson Roch, NP. dph ?

## 2021-09-07 NOTE — Patient Instructions (Signed)
[facility] [place]  Discharge Instructions: ?Thank you for choosing [facility] to provide your oncology and hematology care.  ? ?If you have a lab appointment with the [facility], please go directly to the [facility] and check in at the registration area. ? ?Wear comfortable clothing and clothing appropriate for easy access to any Portacath or PICC line.  ? ?We strive to give you quality time with your provider. You may need to reschedule your appointment if you arrive late (15 or more minutes).  Arriving late affects you and other patients whose appointments are after yours.  Also, if you miss three or more appointments without notifying the office, you may be dismissed from the clinic at the provider?s discretion.    ?  ?For prescription refill requests, have your pharmacy contact our office and allow 72 hours for refills to be completed.   ? ?Today you received the following chemotherapy and/or immunotherapy agents Folfiri    ?  ?To help prevent nausea and vomiting after your treatment, we encourage you to take your nausea medication as directed. ? ?BELOW ARE SYMPTOMS THAT SHOULD BE REPORTED IMMEDIATELY: ?*FEVER GREATER THAN 100.4 F (38 ?C) OR HIGHER ?*CHILLS OR SWEATING ?*NAUSEA AND VOMITING THAT IS NOT CONTROLLED WITH YOUR NAUSEA MEDICATION ?*UNUSUAL SHORTNESS OF BREATH ?*UNUSUAL BRUISING OR BLEEDING ?*URINARY PROBLEMS (pain or burning when urinating, or frequent urination) ?*BOWEL PROBLEMS (unusual diarrhea, constipation, pain near the anus) ?TENDERNESS IN MOUTH AND THROAT WITH OR WITHOUT PRESENCE OF ULCERS (sore throat, sores in mouth, or a toothache) ?UNUSUAL RASH, SWELLING OR PAIN  ?UNUSUAL VAGINAL DISCHARGE OR ITCHING  ? ?Items with * indicate a potential emergency and should be followed up as soon as possible or go to the Emergency Department if any problems should occur. ? ?Please show the CHEMOTHERAPY ALERT CARD or IMMUNOTHERAPY ALERT CARD at check-in to the  Emergency Department and triage nurse. ?Should you have questions after your visit or need to cancel or reschedule your appointment, please contact [facility]  [phone] and follow the prompts.  Office hours are 8:00 a.m. to 4:30 p.m. Monday - Friday. Please note that voicemails left after 4:00 p.m. may not be returned until the following business day.  We are closed weekends and major holidays. You have access to a nurse at all times for urgent questions. Please call the main number to the clinic [phone] and follow the prompts. ? ?For any non-urgent questions, you may also contact your provider using MyChart. We now offer e-Visits for anyone 76 and older to request care online for non-urgent symptoms. For details visit [email or web address]. ?  ?Also download the MyChart app! Go to the app store, search "MyChart", open the app, select [facility], and log in with your MyChart username and password. ? ?Due to Covid, a mask is required upon entering the hospital/clinic. If you do not have a mask, one will be given to you upon arrival. For doctor visits, patients may have 1 support person aged 81 or older with them. For treatment visits, patients cannot have anyone with them due to current Covid guidelines and our immunocompromised population.  ?

## 2021-09-07 NOTE — Addendum Note (Signed)
Addended by: Burney Gauze R on: 09/07/2021 11:37 AM ? ? Modules accepted: Orders ? ?

## 2021-09-07 NOTE — Progress Notes (Signed)
?Hematology and Oncology Follow Up Visit ? ?Lauren Mcpherson ?[id number] ?08/18/1965 56 y.o. ?09/07/2021 ? ? 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  ?Current Therapy:        ?Status post cycle 12 of FOLFOXIRI/Avastin =-- D/C Avastin due to Pulmonary Embolism and DC Oxaliplatin due to Neuropathy ?Xarelto 20 mg PO daily - start on 01/22/2021, decreased to 10 mg PO started 07/26/2021 ?  ?Interim History:  Lauren Mcpherson is here today for follow-up and treatment. She is doing quite well and states that her energy has been much improved.  ?The neuropathy in her lower extremities is unchanged from baseline. Thankfully she has not had any falls or syncopal episodes.  ?No issues with infection. No fever, chills, n/v, cough, rash, dizziness, SOB, chest pain, palpitations, abdominal pain or changes in bowel or bladder habits.  ?No blood loss noted. No bruising or petechiae.  ?No swelling or tenderness in her extremities  ?Appetite and hydration are good. Weight is stable at 254 lbs.  ? ?ECOG Performance Status: 1 - Symptomatic but completely ambulatory ? ?Medications:  ?Allergies as of 09/07/2021   ?No Known Allergies ?  ? ?  ?Medication List  ?  ? ?  ? Accurate as of Sep 07, 2021  9:58 AM. If you have any questions, ask your nurse or doctor.  ?  ?  ? ?  ? ?dexamethasone 6 MG tablet ?Commonly known as: DECADRON ?Take 1 tablet (6 mg total) by mouth See admin instructions. Take 6 mg by mouth daily for 3 days, starting the day after chemotherapy. Take with food. ?  ?loperamide 2 MG tablet ?Commonly known as: Imodium A-D ?Take 2 at onset of diarrhea, then 1 every 2hrs until 12hr without a BM. May take 2 tab every 4hrs at bedtime. If diarrhea recurs repeat. ?  ?ondansetron 8 MG tablet ?Commonly known as: Zofran ?Take 1 tablet (8 mg total) by mouth 2 (two) times daily as needed. Start on day 3 after chemotherapy. ?  ?prochlorperazine 10 MG  tablet ?Commonly known as: COMPAZINE ?Take 1 tablet (10 mg total) by mouth every 6 (six) hours as needed (Nausea or vomiting). ?  ?rivaroxaban 10 MG Tabs tablet ?Commonly known as: XARELTO ?Take 1 tablet (10 mg total) by mouth daily with supper. ?  ? ?  ? ? ?Allergies: No Known Allergies ? ?Past Medical History, Surgical history, Social history, and Family History were reviewed and updated. ? ?Review of Systems: ?All other 10 point review of systems is negative.  ? ?Physical Exam: ? vitals were not taken for this visit.  ? ?Wt Readings from Last 3 Encounters:  ?08/24/21 253 lb (114.8 kg)  ?08/10/21 256 lb (116.1 kg)  ?07/27/21 259 lb (117.5 kg)  ? ? ?Ocular: Sclerae unicteric, pupils equal, round and reactive to light ?Ear-nose-throat: Oropharynx clear, dentition fair ?Lymphatic: No cervical or supraclavicular adenopathy ?Lungs no rales or rhonchi, good excursion bilaterally ?Heart regular rate and rhythm, no murmur appreciated ?Abd soft, nontender, positive bowel sounds ?MSK no focal spinal tenderness, no joint edema ?Neuro: non-focal, well-oriented, appropriate affect ?Breasts: Deferred  ? ?Lab Results  ?Component Value Date  ? WBC 5.5 08/24/2021  ? HGB 11.1 (L) 08/24/2021  ? HCT 35.2 (L) 08/24/2021  ? MCV 90.3 08/24/2021  ? PLT 274 08/24/2021  ? ?Lab Results  ?Component Value Date  ? FERRITIN 224 08/10/2021  ? IRON 25 (L) 08/10/2021  ? TIBC 248 (  L) 08/10/2021  ? UIBC 223 08/10/2021  ? IRONPCTSAT 10 (L) 08/10/2021  ? ?Lab Results  ?Component Value Date  ? RETICCTPCT 3.0 01/04/2021  ? RBC 3.90 08/24/2021  ? ?No results found for: KPAFRELGTCHN, LAMBDASER, KAPLAMBRATIO ?No results found for: IGGSERUM, IGA, IGMSERUM ?No results found for: TOTALPROTELP, ALBUMINELP, A1GS, A2GS, BETS, BETA2SER, GAMS, MSPIKE, SPEI ?  Chemistry   ?   ?Component Value Date/Time  ? NA 141 08/24/2021 0810  ? K 3.2 (L) 08/24/2021 0810  ? CL 107 08/24/2021 0810  ? CO2 28 08/24/2021 0810  ? BUN <5 (L) 08/24/2021 0810  ? CREATININE 0.68  08/24/2021 0810  ?    ?Component Value Date/Time  ? CALCIUM 8.7 (L) 08/24/2021 0810  ? ALKPHOS 79 08/24/2021 0810  ? AST 26 08/24/2021 0810  ? ALT 34 08/24/2021 0810  ? BILITOT 0.5 08/24/2021 0810  ?  ? ? ? ?Impression and Plan: Lauren Mcpherson is a very pleasant 56 yo African American female with metastatic 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   ?She continues to do well.  ?We will proceed with treatment today as planned.  ?We will plan to repeat her MRI in June  ?Follow-up in 2 weeks.  ? ?Lottie Dawson, NP ?5/17/20239:58 AM ? ?

## 2021-09-09 ENCOUNTER — Inpatient Hospital Stay: Payer: PRIVATE HEALTH INSURANCE

## 2021-09-09 VITALS — BP 109/78 | HR 94 | Temp 98.2°F | Resp 17

## 2021-09-09 DIAGNOSIS — Z5111 Encounter for antineoplastic chemotherapy: Secondary | ICD-10-CM | POA: Diagnosis not present

## 2021-09-09 DIAGNOSIS — C180 Malignant neoplasm of cecum: Secondary | ICD-10-CM

## 2021-09-09 MED ORDER — HEPARIN SOD (PORK) LOCK FLUSH 100 UNIT/ML IV SOLN: 500.0000 [IU] | Freq: Once | INTRAVENOUS | Status: DC

## 2021-09-09 MED ORDER — ALTEPLASE 2 MG IJ SOLR: 2.0000 mg | Freq: Once | INTRAMUSCULAR | Status: DC | PRN

## 2021-09-09 MED ORDER — FAMOTIDINE IN NACL 20-0.9 MG/50ML-% IV SOLN: 20.0000 mg | Freq: Once | INTRAVENOUS | Status: DC | PRN

## 2021-09-09 MED ORDER — ALBUTEROL SULFATE (2.5 MG/3ML) 0.083% IN NEBU: 2.5000 mg | INHALATION_SOLUTION | Freq: Once | RESPIRATORY_TRACT | Status: DC | PRN

## 2021-09-09 MED ORDER — PEGFILGRASTIM-BMEZ 6 MG/0.6ML ~~LOC~~ SOSY
6.0000 mg | PREFILLED_SYRINGE | Freq: Once | SUBCUTANEOUS | Status: AC
  Administered 2021-09-09: 6 mg via SUBCUTANEOUS
  Filled 2021-09-09: qty 0.6

## 2021-09-09 MED ORDER — SODIUM CHLORIDE 0.9% FLUSH
10.0000 mL | Freq: Once | INTRAVENOUS | Status: AC | PRN
  Administered 2021-09-09: 10 mL

## 2021-09-09 MED ORDER — HEPARIN SOD (PORK) LOCK FLUSH 100 UNIT/ML IV SOLN: 500.0000 [IU] | Freq: Once | INTRAVENOUS | Status: DC | PRN

## 2021-09-09 MED ORDER — SODIUM CHLORIDE 0.9% FLUSH: 10.0000 mL | INTRAVENOUS | Status: DC | PRN

## 2021-09-09 MED ORDER — HEPARIN SOD (PORK) LOCK FLUSH 100 UNIT/ML IV SOLN
250.0000 [IU] | Freq: Once | INTRAVENOUS | Status: AC | PRN
  Administered 2021-09-09: 250 [IU]

## 2021-09-09 MED ORDER — SODIUM CHLORIDE 0.9% FLUSH: 3.0000 mL | Freq: Once | INTRAVENOUS | Status: DC | PRN

## 2021-09-09 MED ORDER — SODIUM CHLORIDE 0.9 % IV SOLN: Freq: Once | INTRAVENOUS | Status: DC | PRN

## 2021-09-09 MED ORDER — DIPHENHYDRAMINE HCL 50 MG/ML IJ SOLN: 50.0000 mg | Freq: Once | INTRAMUSCULAR | Status: DC | PRN

## 2021-09-09 MED ORDER — SODIUM CHLORIDE 0.9% FLUSH
10.0000 mL | Freq: Once | INTRAVENOUS | Status: AC
  Administered 2021-09-09: 10 mL

## 2021-09-09 MED ORDER — HEPARIN SOD (PORK) LOCK FLUSH 100 UNIT/ML IV SOLN
500.0000 [IU] | Freq: Once | INTRAVENOUS | Status: AC | PRN
  Administered 2021-09-09: 250 [IU]

## 2021-09-09 MED ORDER — METHYLPREDNISOLONE SODIUM SUCC 125 MG IJ SOLR: 125.0000 mg | Freq: Once | INTRAMUSCULAR | Status: DC | PRN

## 2021-09-09 MED ORDER — EPINEPHRINE 0.3 MG/0.3ML IJ SOAJ: 0.3000 mg | Freq: Once | INTRAMUSCULAR | Status: DC | PRN

## 2021-09-09 NOTE — Patient Instructions (Signed)

## 2021-09-12 ENCOUNTER — Inpatient Hospital Stay: Payer: PRIVATE HEALTH INSURANCE

## 2021-09-12 VITALS — BP 117/70 | HR 77 | Temp 98.5°F | Resp 17

## 2021-09-12 DIAGNOSIS — Z452 Encounter for adjustment and management of vascular access device: Secondary | ICD-10-CM

## 2021-09-12 DIAGNOSIS — Z5111 Encounter for antineoplastic chemotherapy: Secondary | ICD-10-CM | POA: Diagnosis not present

## 2021-09-12 MED ORDER — HEPARIN SOD (PORK) LOCK FLUSH 100 UNIT/ML IV SOLN
250.0000 [IU] | INTRAVENOUS | Status: AC | PRN
  Administered 2021-09-12: 250 [IU]

## 2021-09-12 MED ORDER — SODIUM CHLORIDE 0.9% FLUSH
10.0000 mL | INTRAVENOUS | Status: AC | PRN
  Administered 2021-09-12: 10 mL

## 2021-09-14 ENCOUNTER — Inpatient Hospital Stay: Payer: PRIVATE HEALTH INSURANCE

## 2021-09-14 ENCOUNTER — Ambulatory Visit: Payer: PRIVATE HEALTH INSURANCE

## 2021-09-14 VITALS — BP 120/79 | HR 82 | Temp 97.8°F | Resp 17

## 2021-09-14 DIAGNOSIS — C180 Malignant neoplasm of cecum: Secondary | ICD-10-CM

## 2021-09-14 DIAGNOSIS — Z5111 Encounter for antineoplastic chemotherapy: Secondary | ICD-10-CM | POA: Diagnosis not present

## 2021-09-14 MED ORDER — SODIUM CHLORIDE 0.9% FLUSH
10.0000 mL | Freq: Once | INTRAVENOUS | Status: AC
  Administered 2021-09-14: 10 mL

## 2021-09-14 MED ORDER — HEPARIN SOD (PORK) LOCK FLUSH 100 UNIT/ML IV SOLN
500.0000 [IU] | Freq: Once | INTRAVENOUS | Status: AC
  Administered 2021-09-14: 500 [IU]

## 2021-09-16 ENCOUNTER — Inpatient Hospital Stay: Payer: PRIVATE HEALTH INSURANCE

## 2021-09-16 VITALS — BP 137/78 | HR 65 | Temp 98.5°F | Resp 17

## 2021-09-16 DIAGNOSIS — Z5111 Encounter for antineoplastic chemotherapy: Secondary | ICD-10-CM | POA: Diagnosis not present

## 2021-09-16 DIAGNOSIS — Z452 Encounter for adjustment and management of vascular access device: Secondary | ICD-10-CM

## 2021-09-16 MED ORDER — HEPARIN SOD (PORK) LOCK FLUSH 100 UNIT/ML IV SOLN
250.0000 [IU] | INTRAVENOUS | Status: AC | PRN
  Administered 2021-09-16: 250 [IU]

## 2021-09-16 MED ORDER — SODIUM CHLORIDE 0.9% FLUSH: 10.0000 mL | INTRAVENOUS | Status: DC | PRN

## 2021-09-16 MED ORDER — SODIUM CHLORIDE 0.9% FLUSH
10.0000 mL | INTRAVENOUS | Status: AC | PRN
  Administered 2021-09-16: 10 mL

## 2021-09-20 ENCOUNTER — Inpatient Hospital Stay: Payer: PRIVATE HEALTH INSURANCE

## 2021-09-21 ENCOUNTER — Inpatient Hospital Stay: Payer: PRIVATE HEALTH INSURANCE

## 2021-09-21 ENCOUNTER — Inpatient Hospital Stay (HOSPITAL_BASED_OUTPATIENT_CLINIC_OR_DEPARTMENT_OTHER): Payer: PRIVATE HEALTH INSURANCE | Admitting: Family

## 2021-09-21 ENCOUNTER — Encounter: Payer: Self-pay | Admitting: Family

## 2021-09-21 VITALS — BP 128/80 | HR 68 | Temp 98.2°F | Wt 256.0 lb

## 2021-09-21 DIAGNOSIS — I2690 Septic pulmonary embolism without acute cor pulmonale: Secondary | ICD-10-CM

## 2021-09-21 DIAGNOSIS — C180 Malignant neoplasm of cecum: Secondary | ICD-10-CM | POA: Diagnosis not present

## 2021-09-21 DIAGNOSIS — D509 Iron deficiency anemia, unspecified: Secondary | ICD-10-CM

## 2021-09-21 DIAGNOSIS — Z5111 Encounter for antineoplastic chemotherapy: Secondary | ICD-10-CM | POA: Diagnosis not present

## 2021-09-21 DIAGNOSIS — C787 Secondary malignant neoplasm of liver and intrahepatic bile duct: Secondary | ICD-10-CM

## 2021-09-21 LAB — CBC WITH DIFFERENTIAL (CANCER CENTER ONLY)
Abs Immature Granulocytes: 0.05 10*3/uL (ref 0.00–0.07)
Basophils Absolute: 0.1 10*3/uL (ref 0.0–0.1)
Basophils Relative: 1 %
Eosinophils Absolute: 0.1 10*3/uL (ref 0.0–0.5)
Eosinophils Relative: 1 %
HCT: 35.4 % — ABNORMAL LOW (ref 36.0–46.0)
Hemoglobin: 11 g/dL — ABNORMAL LOW (ref 12.0–15.0)
Immature Granulocytes: 1 %
Lymphocytes Relative: 28 %
Lymphs Abs: 1.6 10*3/uL (ref 0.7–4.0)
MCH: 29.2 pg (ref 26.0–34.0)
MCHC: 31.1 g/dL (ref 30.0–36.0)
MCV: 93.9 fL (ref 80.0–100.0)
Monocytes Absolute: 0.7 10*3/uL (ref 0.1–1.0)
Monocytes Relative: 12 %
Neutro Abs: 3.4 10*3/uL (ref 1.7–7.7)
Neutrophils Relative %: 57 %
Platelet Count: 240 10*3/uL (ref 150–400)
RBC: 3.77 MIL/uL — ABNORMAL LOW (ref 3.87–5.11)
RDW: 19.3 % — ABNORMAL HIGH (ref 11.5–15.5)
WBC Count: 5.9 10*3/uL (ref 4.0–10.5)
nRBC: 0 % (ref 0.0–0.2)

## 2021-09-21 LAB — IRON AND IRON BINDING CAPACITY (CC-WL,HP ONLY)
Iron: 53 ug/dL (ref 28–170)
Saturation Ratios: 23 % (ref 10.4–31.8)
TIBC: 230 ug/dL — ABNORMAL LOW (ref 250–450)
UIBC: 177 ug/dL (ref 148–442)

## 2021-09-21 LAB — LACTATE DEHYDROGENASE: LDH: 161 U/L (ref 98–192)

## 2021-09-21 LAB — CMP (CANCER CENTER ONLY)
ALT: 24 U/L (ref 0–44)
AST: 17 U/L (ref 15–41)
Albumin: 3.7 g/dL (ref 3.5–5.0)
Alkaline Phosphatase: 68 U/L (ref 38–126)
Anion gap: 8 (ref 5–15)
BUN: 9 mg/dL (ref 6–20)
CO2: 29 mmol/L (ref 22–32)
Calcium: 9.3 mg/dL (ref 8.9–10.3)
Chloride: 106 mmol/L (ref 98–111)
Creatinine: 0.69 mg/dL (ref 0.44–1.00)
GFR, Estimated: >60 mL/min (ref >60)
Glucose, Bld: 99 mg/dL (ref 70–99)
Potassium: 3.2 mmol/L — ABNORMAL LOW (ref 3.5–5.1)
Sodium: 143 mmol/L (ref 135–145)
Total Bilirubin: 0.3 mg/dL (ref 0.3–1.2)
Total Protein: 6.1 g/dL — ABNORMAL LOW (ref 6.5–8.1)

## 2021-09-21 LAB — RETICULOCYTES
Immature Retic Fract: 20.8 % — ABNORMAL HIGH (ref 2.3–15.9)
RBC.: 3.78 MIL/uL — ABNORMAL LOW (ref 3.87–5.11)
Retic Count, Absolute: 105.1 10*3/uL (ref 19.0–186.0)
Retic Ct Pct: 2.8 % (ref 0.4–3.1)

## 2021-09-21 LAB — CEA (IN HOUSE-CHCC): CEA (CHCC-In House): 3.16 ng/mL (ref 0.00–5.00)

## 2021-09-21 LAB — FERRITIN: Ferritin: 616 ng/mL — ABNORMAL HIGH (ref 11–307)

## 2021-09-21 MED ORDER — SODIUM CHLORIDE 0.9 % IV SOLN
150.0000 mg | Freq: Once | INTRAVENOUS | Status: AC
  Administered 2021-09-21: 150 mg via INTRAVENOUS
  Filled 2021-09-21: qty 150

## 2021-09-21 MED ORDER — SODIUM CHLORIDE 0.9 % IV SOLN
2400.0000 mg/m2 | INTRAVENOUS | Status: DC
  Administered 2021-09-21: 5750 mg via INTRAVENOUS
  Filled 2021-09-21: qty 100

## 2021-09-21 MED ORDER — SODIUM CHLORIDE 0.9 % IV SOLN
10.0000 mg | Freq: Once | INTRAVENOUS | Status: AC
  Administered 2021-09-21: 10 mg via INTRAVENOUS
  Filled 2021-09-21: qty 10

## 2021-09-21 MED ORDER — PALONOSETRON HCL INJECTION 0.25 MG/5ML
0.2500 mg | Freq: Once | INTRAVENOUS | Status: AC
  Administered 2021-09-21: 0.25 mg via INTRAVENOUS
  Filled 2021-09-21: qty 5

## 2021-09-21 MED ORDER — SODIUM CHLORIDE 0.9 % IV SOLN
400.0000 mg/m2 | Freq: Once | INTRAVENOUS | Status: AC
  Administered 2021-09-21: 956 mg via INTRAVENOUS
  Filled 2021-09-21: qty 47.8

## 2021-09-21 MED ORDER — SODIUM CHLORIDE 0.9 % IV SOLN
165.0000 mg/m2 | Freq: Once | INTRAVENOUS | Status: AC
  Administered 2021-09-21: 400 mg via INTRAVENOUS
  Filled 2021-09-21: qty 15

## 2021-09-21 MED ORDER — DEXTROSE 5 % IV SOLN: Freq: Once | INTRAVENOUS | Status: AC

## 2021-09-21 MED ORDER — ATROPINE SULFATE 1 MG/ML IV SOLN
0.5000 mg | Freq: Once | INTRAVENOUS | Status: AC | PRN
  Administered 2021-09-21: 0.5 mg via INTRAVENOUS
  Filled 2021-09-21: qty 1

## 2021-09-21 NOTE — Patient Instructions (Signed)
[facility] [place]  Discharge Instructions: Thank you for choosing [facility] to provide your oncology and hematology care.   If you have a lab appointment with the [facility], please go directly to the [facility] and check in at the registration area.  Wear comfortable clothing and clothing appropriate for easy access to any Portacath or PICC line.   We strive to give you quality time with your provider. You may need to reschedule your appointment if you arrive late (15 or more minutes).  Arriving late affects you and other patients whose appointments are after yours.  Also, if you miss three or more appointments without notifying the office, you may be dismissed from the clinic at the provider's discretion.      For prescription refill requests, have your pharmacy contact our office and allow 72 hours for refills to be completed.    Today you received the following chemotherapy and/or immunotherapy agents 5-fu, leucovorin, cpt-11   To help prevent nausea and vomiting after your treatment, we encourage you to take your nausea medication as directed.  BELOW ARE SYMPTOMS THAT SHOULD BE REPORTED IMMEDIATELY: *FEVER GREATER THAN 100.4 F (38 C) OR HIGHER *CHILLS OR SWEATING *NAUSEA AND VOMITING THAT IS NOT CONTROLLED WITH YOUR NAUSEA MEDICATION *UNUSUAL SHORTNESS OF BREATH *UNUSUAL BRUISING OR BLEEDING *URINARY PROBLEMS (pain or burning when urinating, or frequent urination) *BOWEL PROBLEMS (unusual diarrhea, constipation, pain near the anus) TENDERNESS IN MOUTH AND THROAT WITH OR WITHOUT PRESENCE OF ULCERS (sore throat, sores in mouth, or a toothache) UNUSUAL RASH, SWELLING OR PAIN  UNUSUAL VAGINAL DISCHARGE OR ITCHING   Items with * indicate a potential emergency and should be followed up as soon as possible or go to the Emergency Department if any problems should occur.  Please show the CHEMOTHERAPY ALERT CARD or IMMUNOTHERAPY ALERT CARD at  check-in to the Emergency Department and triage nurse. Should you have questions after your visit or need to cancel or reschedule your appointment, please contact [facility]  [phone] and follow the prompts.  Office hours are 8:00 a.m. to 4:30 p.m. Monday - Friday. Please note that voicemails left after 4:00 p.m. may not be returned until the following business day.  We are closed weekends and major holidays. You have access to a nurse at all times for urgent questions. Please call the main number to the clinic [phone] and follow the prompts.  For any non-urgent questions, you may also contact your provider using MyChart. We now offer e-Visits for anyone 60 and older to request care online for non-urgent symptoms. For details visit [email or web address].   Also download the MyChart app! Go to the app store, search "MyChart", open the app, select [facility], and log in with your MyChart username and password.  Due to Covid, a mask is required upon entering the hospital/clinic. If you do not have a mask, one will be given to you upon arrival. For doctor visits, patients may have 1 support person aged 80 or older with them. For treatment visits, patients cannot have anyone with them due to current Covid guidelines and our immunocompromised population.

## 2021-09-21 NOTE — Progress Notes (Signed)
Hematology and Oncology Follow Up Visit  Lauren Mcpherson [id number] 10-02-1965 56 y.o. 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 She is doing well and has no new complaints at this time.  She has occasional fatigue and will take a break to rest when needed.  No blood loss noted. No bruising or petechiae.  CEA last visit was 4.42. Today's result is pending.  No fever, chills, n/v, cough, rash, dizziness, SOB, chest pain, palpitations, abdominal pain or changes in bowel or bladder habits.  No swelling in her extremities. Neuropathy in her lower extremities is unchanged from baseline.  No falls or syncope to report. She enjoys walking for exercise.  She is taking B complex vitamin daily.  Appetite and hydration are good. Weight is stable at 256 lbs.   ECOG Performance Status: 1 - Symptomatic but completely ambulatory  Medications:  Allergies as of 09/21/2021   No Known Allergies      Medication List        Accurate as of Sep 21, 2021  8:51 AM. If you have any questions, ask your nurse or doctor.          dexamethasone 6 MG tablet Commonly known as: DECADRON Take 1 tablet (6 mg total) by mouth See admin instructions. Take 6 mg by mouth daily for 3 days, starting the day after chemotherapy. Take with food.   loperamide 2 MG tablet Commonly known as: Imodium A-D Take 2 at onset of diarrhea, then 1 every 2hrs until 12hr without a BM. May take 2 tab every 4hrs at bedtime. If diarrhea recurs repeat.   ondansetron 8 MG tablet Commonly known as: Zofran Take 1 tablet (8 mg  total) by mouth 2 (two) times daily as needed. Start on day 3 after chemotherapy.   prochlorperazine 10 MG tablet Commonly known as: COMPAZINE Take 1 tablet (10 mg total) by mouth every 6 (six) hours as needed (Nausea or vomiting).   rivaroxaban 10 MG Tabs tablet Commonly known as: XARELTO Take 1 tablet (10 mg total) by mouth daily with supper.        Allergies: No Known Allergies  Past Medical History, Surgical history, Social history, and Family History were reviewed and updated.  Review of Systems: All other 10 point review of systems is negative.   Physical Exam:  weight is 256 lb (116.1 kg). Her oral temperature is 98.2 F (36.8 C). Her blood pressure is 128/80 and her pulse is 68. Her oxygen saturation is 98%.   Wt Readings from Last 3 Encounters:  09/21/21 256 lb (116.1 kg)  09/07/21 254 lb (115.2 kg)  08/24/21 253 lb (114.8 kg)    Ocular: Sclerae unicteric, pupils equal, round and reactive to light Ear-nose-throat: Oropharynx clear, dentition fair Lymphatic: No cervical or supraclavicular adenopathy Lungs no rales or rhonchi, good excursion bilaterally Heart regular rate and rhythm, no murmur appreciated Abd soft, nontender, positive bowel sounds MSK no focal spinal tenderness, no joint edema Neuro: non-focal, well-oriented, appropriate affect Breasts: Deferred   Lab Results  Component Value Date   WBC 3.8 (  L) 09/07/2021   HGB 11.1 (L) 09/07/2021   HCT 35.7 (L) 09/07/2021   MCV 93.7 09/07/2021   PLT 241 09/07/2021   Lab Results  Component Value Date   FERRITIN 834 (H) 09/07/2021   IRON 65 09/07/2021   TIBC 213 (L) 09/07/2021   UIBC 148 09/07/2021   IRONPCTSAT 31 09/07/2021   Lab Results  Component Value Date   RETICCTPCT 3.0 01/04/2021   RBC 3.81 (L) 09/07/2021   No results found for: KPAFRELGTCHN, LAMBDASER, KAPLAMBRATIO No results found for: IGGSERUM, IGA, IGMSERUM No results found for: Odetta Pink, SPEI   Chemistry      Component Value Date/Time   NA 143 09/07/2021 0955   K 3.2 (L) 09/07/2021 0955   CL 106 09/07/2021 0955   CO2 29 09/07/2021 0955   BUN 8 09/07/2021 0955   CREATININE 0.77 09/07/2021 0955      Component Value Date/Time   CALCIUM 9.0 09/07/2021 0955   ALKPHOS 70 09/07/2021 0955   AST 17 09/07/2021 0955   ALT 24 09/07/2021 0955   BILITOT 0.3 09/07/2021 0955       Impression and Plan: Lauren Mcpherson is a very pleasant 56 yo African American female with metastatic 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   She continues to do well. We will proceed with treatment today as planned.  We will repeat her MRI of the liver in 2 weeks.  Follow-up in 2 weeks.   Lottie Dawson, NP 5/31/20238:51 AM

## 2021-09-21 NOTE — Patient Instructions (Signed)
PICC Home Care Guide A peripherally inserted central catheter (PICC) is a form of IV access that allows medicines and IV fluids to be quickly put into the blood and spread throughout the body. The PICC is a long, thin, flexible tube (catheter) that is put into a vein in a person's arm or leg. The catheter ends in a large vein just outside the heart called the superior vena cava (SVC). After the PICC is put in, a chest X-ray may be done to make sure that it is in the right place. A PICC may be placed for different reasons, such as: To give medicines and liquid nutrition. To give IV fluids and blood products. To take blood samples often. If there is trouble placing a peripheral intravenous (PIV) catheter. If cared for properly, a PICC can remain in place for many months. Having a PICC can allow you to go home from the hospital sooner and continue treatment at home. Medicines and PICC care can be managed at home by a family member, caregiver, or home health care team. What are the risks? Generally, having a PICC is safe. However, problems may occur, including: A blood clot (thrombus) forming in or at the end of the PICC. A blood clot forming in a vein (deep vein thrombosis) or traveling to the lung (pulmonary embolism). Inflammation of the vein (phlebitis) in which the PICC is placed. Infection at the insertion site or in the blood. Blood infections from central lines, like PICCs, can be serious and often require a hospital stay. PICC malposition, or PICC movement or poor placement. A break or cut in the PICC. Do not use scissors near the PICC. Nerve or tendon irritation or injury during PICC insertion. How to care for your PICC Please follow the specific guidelines provided by your health care provider. Preventing infection You and any caregivers should wash your hands often with soap and water for at least 20 seconds. Wash hands: Before touching the PICC or the infusion device. Before changing a  bandage (dressing). Do not change the dressing unless you have been taught to do so and have shown you are able to change it safely. Flush the PICC as told. Tell your health care provider right away if the PICC is hard to flush or does not flush. Do not use force to flush the PICC. Use clean and germ-free (sterile) supplies only. Keep the supplies in a dry place. Do not reuse needles, syringes, or any other supplies. Reusing supplies can lead to infection. Keep the PICC dressing dry and secure it with tape if the edges stop sticking to your skin. Check your PICC insertion site every day for signs of infection. Check for: Redness, swelling, or pain. Fluid or blood. Warmth. Pus or a bad smell. Preventing other problems Do not use a syringe that is less than 10 mL to flush the PICC. Do not have your blood pressure checked on the arm in which the PICC is placed. Do not ever pull or tug on the PICC. Keep it secured to your arm with tape or a stretch wrap when not in use. Do not take the PICC out yourself. Only a trained health care provider should remove the PICC. Keep pets and children away from your PICC. How to care for your PICC dressing Keep your PICC dressing clean and dry to prevent infection. Do not take baths, swim, or use a hot tub until your health care provider approves. Ask your health care provider if you can take   showers. You may only be allowed to take sponge baths. When you are allowed to shower: Ask your health care provider to teach you how to wrap the PICC. Cover the PICC with clear plastic wrap and tape to keep it dry while showering. Follow instructions from your health care provider about how to take care of your insertion site and dressing. Make sure you: Wash your hands with soap and water for at least 20 seconds before and after you change your dressing. If soap and water are not available, use hand sanitizer. Change your dressing only if taught to do so by your health care  provider. Your PICC dressing needs to be changed if it becomes loose or wet. Leave stitches (sutures), skin glue, or adhesive strips in place. These skin closures may need to stay in place for 2 weeks or longer. If adhesive strip edges start to loosen and curl up, you may trim the loose edges. Do not remove adhesive strips completely unless your health care provider tells you to do that. Follow these instructions at home: Disposal of supplies Throw away any syringes in a disposal container that is meant for sharp items (sharps container). You can buy a sharps container from a pharmacy, or you can make one by using an empty, hard plastic bottle with a lid. Place any used dressings or infusion bags into a plastic bag. Throw that bag in the trash. General instructions  Always carry your PICC identification card or wear a medical alert bracelet. Keep the tube clamped at all times, unless it is being used. Always carry a smooth-edge clamp with you to clamp the PICC if it breaks. Do not use scissors or sharp objects near the tube. You may bend your arm and move it freely. If your PICC is near or at the bend of your elbow, avoid activity with repeated motion at the elbow. Avoid lifting heavy objects as told by your health care provider. Keep all follow-up visits. This is important. You will need to have your PICC dressing changed at least once a week. Contact a health care provider if: You have pain in your arm, ear, face, or teeth. You have a fever or chills. You have redness, swelling, or pain around the insertion site. You have fluid or blood coming from the insertion site. Your insertion site feels warm to the touch. You have pus or a bad smell coming from the insertion site. Your skin feels hard and raised around the insertion site. Your PICC dressing has gotten wet or is coming off and you have not been taught how to change it. Get help right away if: You have problems with your PICC, such as  your PICC: Was tugged or pulled and has partially come out. Do not  push the PICC back in. Cannot be flushed, is hard to flush, or leaks around the insertion site when it is flushed. Makes a flushing sound when it is flushed. Appears to have a hole or tear. Is accidentally pulled all the way out. If this happens, cover the insertion site with a gauze dressing. Do not throw the PICC away. Your health care provider will need to check it to be sure the entire catheter came out. You feel your heart racing or skipping beats, or you have chest pain. You have shortness of breath or trouble breathing. You have swelling, redness, warmth, or pain in the arm in which the PICC is placed. You have a red streak going up your arm that   starts under the PICC dressing. These symptoms may be an emergency. Get help right away. Call 911. Do not wait to see if the symptoms will go away. Do not drive yourself to the hospital. Summary A peripherally inserted central catheter (PICC) is a long, thin, flexible tube (catheter) that is put into a vein in the arm or leg. If cared for properly, a PICC can remain in place for many months. Having a PICC can allow you to go home from the hospital sooner and continue treatment at home. The PICC is inserted using a germ-free (sterile) technique by a specially trained health care provider. Only a trained health care provider should remove it. Do not have your blood pressure checked on the arm in which your PICC is placed. Always keep your PICC identification card with you. This information is not intended to replace advice given to you by your health care provider. Make sure you discuss any questions you have with your health care provider. Document Revised: 10/27/2020 Document Reviewed: 10/27/2020 Elsevier Patient Education  2023 Else[employer] Inc.  

## 2021-09-23 ENCOUNTER — Inpatient Hospital Stay: Payer: PRIVATE HEALTH INSURANCE | Attending: Hematology & Oncology

## 2021-09-23 VITALS — BP 100/64 | HR 75 | Temp 98.5°F | Resp 17

## 2021-09-23 DIAGNOSIS — Z7901 Long term (current) use of anticoagulants: Secondary | ICD-10-CM | POA: Insufficient documentation

## 2021-09-23 DIAGNOSIS — C180 Malignant neoplasm of cecum: Secondary | ICD-10-CM | POA: Insufficient documentation

## 2021-09-23 DIAGNOSIS — Z5189 Encounter for other specified aftercare: Secondary | ICD-10-CM | POA: Diagnosis not present

## 2021-09-23 DIAGNOSIS — Z452 Encounter for adjustment and management of vascular access device: Secondary | ICD-10-CM | POA: Diagnosis not present

## 2021-09-23 DIAGNOSIS — Z5111 Encounter for antineoplastic chemotherapy: Secondary | ICD-10-CM | POA: Insufficient documentation

## 2021-09-23 DIAGNOSIS — G629 Polyneuropathy, unspecified: Secondary | ICD-10-CM | POA: Insufficient documentation

## 2021-09-23 DIAGNOSIS — C787 Secondary malignant neoplasm of liver and intrahepatic bile duct: Secondary | ICD-10-CM | POA: Insufficient documentation

## 2021-09-23 MED ORDER — HEPARIN SOD (PORK) LOCK FLUSH 100 UNIT/ML IV SOLN
500.0000 [IU] | Freq: Once | INTRAVENOUS | Status: AC | PRN
  Administered 2021-09-23: 500 [IU]

## 2021-09-23 MED ORDER — SODIUM CHLORIDE 0.9% FLUSH
10.0000 mL | INTRAVENOUS | Status: DC | PRN
  Administered 2021-09-23: 10 mL

## 2021-09-23 MED ORDER — PEGFILGRASTIM-BMEZ 6 MG/0.6ML ~~LOC~~ SOSY
6.0000 mg | PREFILLED_SYRINGE | Freq: Once | SUBCUTANEOUS | Status: AC
  Administered 2021-09-23: 6 mg via SUBCUTANEOUS
  Filled 2021-09-23: qty 0.6

## 2021-09-26 ENCOUNTER — Inpatient Hospital Stay: Payer: PRIVATE HEALTH INSURANCE | Attending: Hematology & Oncology

## 2021-09-26 VITALS — BP 117/71 | HR 65 | Temp 97.8°F | Resp 16

## 2021-09-26 DIAGNOSIS — C180 Malignant neoplasm of cecum: Secondary | ICD-10-CM | POA: Insufficient documentation

## 2021-09-26 DIAGNOSIS — Z452 Encounter for adjustment and management of vascular access device: Secondary | ICD-10-CM | POA: Diagnosis not present

## 2021-09-26 MED ORDER — HEPARIN SOD (PORK) LOCK FLUSH 100 UNIT/ML IV SOLN: 500.0000 [IU] | Freq: Once | INTRAVENOUS | Status: DC

## 2021-09-26 MED ORDER — HEPARIN SOD (PORK) LOCK FLUSH 100 UNIT/ML IV SOLN
500.0000 [IU] | Freq: Once | INTRAVENOUS | Status: AC
  Administered 2021-09-26: 500 [IU]

## 2021-09-26 MED ORDER — SODIUM CHLORIDE 0.9% FLUSH
10.0000 mL | Freq: Once | INTRAVENOUS | Status: AC
  Administered 2021-09-26: 10 mL

## 2021-09-26 NOTE — Patient Instructions (Signed)
PICC Home Care Guide A peripherally inserted central catheter (PICC) is a form of IV access that allows medicines and IV fluids to be quickly put into the blood and spread throughout the body. The PICC is a long, thin, flexible tube (catheter) that is put into a vein in a person's arm or leg. The catheter ends in a large vein just outside the heart called the superior vena cava (SVC). After the PICC is put in, a chest X-ray may be done to make sure that it is in the right place. A PICC may be placed for different reasons, such as: To give medicines and liquid nutrition. To give IV fluids and blood products. To take blood samples often. If there is trouble placing a peripheral intravenous (PIV) catheter. If cared for properly, a PICC can remain in place for many months. Having a PICC can allow you to go home from the hospital sooner and continue treatment at home. Medicines and PICC care can be managed at home by a family member, caregiver, or home health care team. What are the risks? Generally, having a PICC is safe. However, problems may occur, including: A blood clot (thrombus) forming in or at the end of the PICC. A blood clot forming in a vein (deep vein thrombosis) or traveling to the lung (pulmonary embolism). Inflammation of the vein (phlebitis) in which the PICC is placed. Infection at the insertion site or in the blood. Blood infections from central lines, like PICCs, can be serious and often require a hospital stay. PICC malposition, or PICC movement or poor placement. A break or cut in the PICC. Do not use scissors near the PICC. Nerve or tendon irritation or injury during PICC insertion. How to care for your PICC Please follow the specific guidelines provided by your health care provider. Preventing infection You and any caregivers should wash your hands often with soap and water for at least 20 seconds. Wash hands: Before touching the PICC or the infusion device. Before changing a  bandage (dressing). Do not change the dressing unless you have been taught to do so and have shown you are able to change it safely. Flush the PICC as told. Tell your health care provider right away if the PICC is hard to flush or does not flush. Do not use force to flush the PICC. Use clean and germ-free (sterile) supplies only. Keep the supplies in a dry place. Do not reuse needles, syringes, or any other supplies. Reusing supplies can lead to infection. Keep the PICC dressing dry and secure it with tape if the edges stop sticking to your skin. Check your PICC insertion site every day for signs of infection. Check for: Redness, swelling, or pain. Fluid or blood. Warmth. Pus or a bad smell. Preventing other problems Do not use a syringe that is less than 10 mL to flush the PICC. Do not have your blood pressure checked on the arm in which the PICC is placed. Do not ever pull or tug on the PICC. Keep it secured to your arm with tape or a stretch wrap when not in use. Do not take the PICC out yourself. Only a trained health care provider should remove the PICC. Keep pets and children away from your PICC. How to care for your PICC dressing Keep your PICC dressing clean and dry to prevent infection. Do not take baths, swim, or use a hot tub until your health care provider approves. Ask your health care provider if you can take   showers. You may only be allowed to take sponge baths. When you are allowed to shower: Ask your health care provider to teach you how to wrap the PICC. Cover the PICC with clear plastic wrap and tape to keep it dry while showering. Follow instructions from your health care provider about how to take care of your insertion site and dressing. Make sure you: Wash your hands with soap and water for at least 20 seconds before and after you change your dressing. If soap and water are not available, use hand sanitizer. Change your dressing only if taught to do so by your health care  provider. Your PICC dressing needs to be changed if it becomes loose or wet. Leave stitches (sutures), skin glue, or adhesive strips in place. These skin closures may need to stay in place for 2 weeks or longer. If adhesive strip edges start to loosen and curl up, you may trim the loose edges. Do not remove adhesive strips completely unless your health care provider tells you to do that. Follow these instructions at home: Disposal of supplies Throw away any syringes in a disposal container that is meant for sharp items (sharps container). You can buy a sharps container from a pharmacy, or you can make one by using an empty, hard plastic bottle with a lid. Place any used dressings or infusion bags into a plastic bag. Throw that bag in the trash. General instructions  Always carry your PICC identification card or wear a medical alert bracelet. Keep the tube clamped at all times, unless it is being used. Always carry a smooth-edge clamp with you to clamp the PICC if it breaks. Do not use scissors or sharp objects near the tube. You may bend your arm and move it freely. If your PICC is near or at the bend of your elbow, avoid activity with repeated motion at the elbow. Avoid lifting heavy objects as told by your health care provider. Keep all follow-up visits. This is important. You will need to have your PICC dressing changed at least once a week. Contact a health care provider if: You have pain in your arm, ear, face, or teeth. You have a fever or chills. You have redness, swelling, or pain around the insertion site. You have fluid or blood coming from the insertion site. Your insertion site feels warm to the touch. You have pus or a bad smell coming from the insertion site. Your skin feels hard and raised around the insertion site. Your PICC dressing has gotten wet or is coming off and you have not been taught how to change it. Get help right away if: You have problems with your PICC, such as  your PICC: Was tugged or pulled and has partially come out. Do not  push the PICC back in. Cannot be flushed, is hard to flush, or leaks around the insertion site when it is flushed. Makes a flushing sound when it is flushed. Appears to have a hole or tear. Is accidentally pulled all the way out. If this happens, cover the insertion site with a gauze dressing. Do not throw the PICC away. Your health care provider will need to check it to be sure the entire catheter came out. You feel your heart racing or skipping beats, or you have chest pain. You have shortness of breath or trouble breathing. You have swelling, redness, warmth, or pain in the arm in which the PICC is placed. You have a red streak going up your arm that   starts under the PICC dressing. These symptoms may be an emergency. Get help right away. Call 911. Do not wait to see if the symptoms will go away. Do not drive yourself to the hospital. Summary A peripherally inserted central catheter (PICC) is a long, thin, flexible tube (catheter) that is put into a vein in the arm or leg. If cared for properly, a PICC can remain in place for many months. Having a PICC can allow you to go home from the hospital sooner and continue treatment at home. The PICC is inserted using a germ-free (sterile) technique by a specially trained health care provider. Only a trained health care provider should remove it. Do not have your blood pressure checked on the arm in which your PICC is placed. Always keep your PICC identification card with you. This information is not intended to replace advice given to you by your health care provider. Make sure you discuss any questions you have with your health care provider. Document Revised: 10/27/2020 Document Reviewed: 10/27/2020 Elsevier Patient Education  2023 Else[employer] Inc.  

## 2021-09-27 ENCOUNTER — Encounter: Payer: Self-pay | Admitting: *Deleted

## 2021-09-28 ENCOUNTER — Inpatient Hospital Stay: Payer: PRIVATE HEALTH INSURANCE

## 2021-09-28 DIAGNOSIS — Z5111 Encounter for antineoplastic chemotherapy: Secondary | ICD-10-CM | POA: Diagnosis not present

## 2021-09-28 DIAGNOSIS — Z452 Encounter for adjustment and management of vascular access device: Secondary | ICD-10-CM

## 2021-09-28 MED ORDER — SODIUM CHLORIDE 0.9% FLUSH
10.0000 mL | Freq: Once | INTRAVENOUS | Status: AC
  Administered 2021-09-28: 10 mL via INTRAVENOUS

## 2021-09-28 MED ORDER — HEPARIN SOD (PORK) LOCK FLUSH 100 UNIT/ML IV SOLN
500.0000 [IU] | Freq: Once | INTRAVENOUS | Status: AC
  Administered 2021-09-28: 500 [IU] via INTRAVENOUS

## 2021-09-28 NOTE — Patient Instructions (Signed)
PICC Home Care Guide A peripherally inserted central catheter (PICC) is a form of IV access that allows medicines and IV fluids to be quickly put into the blood and spread throughout the body. The PICC is a long, thin, flexible tube (catheter) that is put into a vein in a person's arm or leg. The catheter ends in a large vein just outside the heart called the superior vena cava (SVC). After the PICC is put in, a chest X-ray may be done to make sure that it is in the right place. A PICC may be placed for different reasons, such as: To give medicines and liquid nutrition. To give IV fluids and blood products. To take blood samples often. If there is trouble placing a peripheral intravenous (PIV) catheter. If cared for properly, a PICC can remain in place for many months. Having a PICC can allow you to go home from the hospital sooner and continue treatment at home. Medicines and PICC care can be managed at home by a family member, caregiver, or home health care team. What are the risks? Generally, having a PICC is safe. However, problems may occur, including: A blood clot (thrombus) forming in or at the end of the PICC. A blood clot forming in a vein (deep vein thrombosis) or traveling to the lung (pulmonary embolism). Inflammation of the vein (phlebitis) in which the PICC is placed. Infection at the insertion site or in the blood. Blood infections from central lines, like PICCs, can be serious and often require a hospital stay. PICC malposition, or PICC movement or poor placement. A break or cut in the PICC. Do not use scissors near the PICC. Nerve or tendon irritation or injury during PICC insertion. How to care for your PICC Please follow the specific guidelines provided by your health care provider. Preventing infection You and any caregivers should wash your hands often with soap and water for at least 20 seconds. Wash hands: Before touching the PICC or the infusion device. Before changing a  bandage (dressing). Do not change the dressing unless you have been taught to do so and have shown you are able to change it safely. Flush the PICC as told. Tell your health care provider right away if the PICC is hard to flush or does not flush. Do not use force to flush the PICC. Use clean and germ-free (sterile) supplies only. Keep the supplies in a dry place. Do not reuse needles, syringes, or any other supplies. Reusing supplies can lead to infection. Keep the PICC dressing dry and secure it with tape if the edges stop sticking to your skin. Check your PICC insertion site every day for signs of infection. Check for: Redness, swelling, or pain. Fluid or blood. Warmth. Pus or a bad smell. Preventing other problems Do not use a syringe that is less than 10 mL to flush the PICC. Do not have your blood pressure checked on the arm in which the PICC is placed. Do not ever pull or tug on the PICC. Keep it secured to your arm with tape or a stretch wrap when not in use. Do not take the PICC out yourself. Only a trained health care provider should remove the PICC. Keep pets and children away from your PICC. How to care for your PICC dressing Keep your PICC dressing clean and dry to prevent infection. Do not take baths, swim, or use a hot tub until your health care provider approves. Ask your health care provider if you can take   showers. You may only be allowed to take sponge baths. When you are allowed to shower: Ask your health care provider to teach you how to wrap the PICC. Cover the PICC with clear plastic wrap and tape to keep it dry while showering. Follow instructions from your health care provider about how to take care of your insertion site and dressing. Make sure you: Wash your hands with soap and water for at least 20 seconds before and after you change your dressing. If soap and water are not available, use hand sanitizer. Change your dressing only if taught to do so by your health care  provider. Your PICC dressing needs to be changed if it becomes loose or wet. Leave stitches (sutures), skin glue, or adhesive strips in place. These skin closures may need to stay in place for 2 weeks or longer. If adhesive strip edges start to loosen and curl up, you may trim the loose edges. Do not remove adhesive strips completely unless your health care provider tells you to do that. Follow these instructions at home: Disposal of supplies Throw away any syringes in a disposal container that is meant for sharp items (sharps container). You can buy a sharps container from a pharmacy, or you can make one by using an empty, hard plastic bottle with a lid. Place any used dressings or infusion bags into a plastic bag. Throw that bag in the trash. General instructions  Always carry your PICC identification card or wear a medical alert bracelet. Keep the tube clamped at all times, unless it is being used. Always carry a smooth-edge clamp with you to clamp the PICC if it breaks. Do not use scissors or sharp objects near the tube. You may bend your arm and move it freely. If your PICC is near or at the bend of your elbow, avoid activity with repeated motion at the elbow. Avoid lifting heavy objects as told by your health care provider. Keep all follow-up visits. This is important. You will need to have your PICC dressing changed at least once a week. Contact a health care provider if: You have pain in your arm, ear, face, or teeth. You have a fever or chills. You have redness, swelling, or pain around the insertion site. You have fluid or blood coming from the insertion site. Your insertion site feels warm to the touch. You have pus or a bad smell coming from the insertion site. Your skin feels hard and raised around the insertion site. Your PICC dressing has gotten wet or is coming off and you have not been taught how to change it. Get help right away if: You have problems with your PICC, such as  your PICC: Was tugged or pulled and has partially come out. Do not  push the PICC back in. Cannot be flushed, is hard to flush, or leaks around the insertion site when it is flushed. Makes a flushing sound when it is flushed. Appears to have a hole or tear. Is accidentally pulled all the way out. If this happens, cover the insertion site with a gauze dressing. Do not throw the PICC away. Your health care provider will need to check it to be sure the entire catheter came out. You feel your heart racing or skipping beats, or you have chest pain. You have shortness of breath or trouble breathing. You have swelling, redness, warmth, or pain in the arm in which the PICC is placed. You have a red streak going up your arm that   starts under the PICC dressing. These symptoms may be an emergency. Get help right away. Call 911. Do not wait to see if the symptoms will go away. Do not drive yourself to the hospital. Summary A peripherally inserted central catheter (PICC) is a long, thin, flexible tube (catheter) that is put into a vein in the arm or leg. If cared for properly, a PICC can remain in place for many months. Having a PICC can allow you to go home from the hospital sooner and continue treatment at home. The PICC is inserted using a germ-free (sterile) technique by a specially trained health care provider. Only a trained health care provider should remove it. Do not have your blood pressure checked on the arm in which your PICC is placed. Always keep your PICC identification card with you. This information is not intended to replace advice given to you by your health care provider. Make sure you discuss any questions you have with your health care provider. Document Revised: 10/27/2020 Document Reviewed: 10/27/2020 Elsevier Patient Education  2023 Else[employer] Inc.  

## 2021-09-30 ENCOUNTER — Inpatient Hospital Stay: Payer: PRIVATE HEALTH INSURANCE

## 2021-09-30 DIAGNOSIS — Z5111 Encounter for antineoplastic chemotherapy: Secondary | ICD-10-CM | POA: Diagnosis not present

## 2021-10-03 ENCOUNTER — Inpatient Hospital Stay: Payer: PRIVATE HEALTH INSURANCE

## 2021-10-03 VITALS — BP 134/78 | Temp 98.1°F | Resp 16

## 2021-10-03 DIAGNOSIS — C180 Malignant neoplasm of cecum: Secondary | ICD-10-CM

## 2021-10-03 DIAGNOSIS — C787 Secondary malignant neoplasm of liver and intrahepatic bile duct: Secondary | ICD-10-CM

## 2021-10-03 DIAGNOSIS — D509 Iron deficiency anemia, unspecified: Secondary | ICD-10-CM

## 2021-10-03 DIAGNOSIS — Z5111 Encounter for antineoplastic chemotherapy: Secondary | ICD-10-CM | POA: Diagnosis not present

## 2021-10-03 DIAGNOSIS — Z452 Encounter for adjustment and management of vascular access device: Secondary | ICD-10-CM

## 2021-10-03 MED ORDER — HEPARIN SOD (PORK) LOCK FLUSH 100 UNIT/ML IV SOLN
250.0000 [IU] | INTRAVENOUS | Status: AC | PRN
  Administered 2021-10-03: 250 [IU]

## 2021-10-03 MED ORDER — SODIUM CHLORIDE 0.9% FLUSH
10.0000 mL | INTRAVENOUS | Status: AC | PRN
  Administered 2021-10-03: 10 mL

## 2021-10-05 ENCOUNTER — Inpatient Hospital Stay: Payer: PRIVATE HEALTH INSURANCE

## 2021-10-05 ENCOUNTER — Other Ambulatory Visit: Payer: Self-pay | Admitting: Oncology

## 2021-10-05 ENCOUNTER — Encounter: Payer: Self-pay | Admitting: Family

## 2021-10-05 ENCOUNTER — Other Ambulatory Visit: Payer: Self-pay | Admitting: *Deleted

## 2021-10-05 ENCOUNTER — Other Ambulatory Visit: Payer: Self-pay

## 2021-10-05 ENCOUNTER — Inpatient Hospital Stay (HOSPITAL_BASED_OUTPATIENT_CLINIC_OR_DEPARTMENT_OTHER): Payer: PRIVATE HEALTH INSURANCE | Admitting: Family

## 2021-10-05 VITALS — BP 124/58 | HR 79 | Temp 98.0°F | Resp 17 | Ht 64.17 in | Wt 253.1 lb

## 2021-10-05 DIAGNOSIS — C787 Secondary malignant neoplasm of liver and intrahepatic bile duct: Secondary | ICD-10-CM | POA: Diagnosis not present

## 2021-10-05 DIAGNOSIS — D509 Iron deficiency anemia, unspecified: Secondary | ICD-10-CM

## 2021-10-05 DIAGNOSIS — C180 Malignant neoplasm of cecum: Secondary | ICD-10-CM | POA: Diagnosis not present

## 2021-10-05 DIAGNOSIS — Z5111 Encounter for antineoplastic chemotherapy: Secondary | ICD-10-CM | POA: Diagnosis not present

## 2021-10-05 LAB — CBC WITH DIFFERENTIAL (CANCER CENTER ONLY)
Abs Immature Granulocytes: 0.07 10*3/uL (ref 0.00–0.07)
Basophils Absolute: 0.1 10*3/uL (ref 0.0–0.1)
Basophils Relative: 1 %
Eosinophils Absolute: 0.1 10*3/uL (ref 0.0–0.5)
Eosinophils Relative: 2 %
HCT: 34.4 % — ABNORMAL LOW (ref 36.0–46.0)
Hemoglobin: 11 g/dL — ABNORMAL LOW (ref 12.0–15.0)
Immature Granulocytes: 1 %
Lymphocytes Relative: 35 %
Lymphs Abs: 1.7 10*3/uL (ref 0.7–4.0)
MCH: 29.6 pg (ref 26.0–34.0)
MCHC: 32 g/dL (ref 30.0–36.0)
MCV: 92.5 fL (ref 80.0–100.0)
Monocytes Absolute: 0.5 10*3/uL (ref 0.1–1.0)
Monocytes Relative: 11 %
Neutro Abs: 2.5 10*3/uL (ref 1.7–7.7)
Neutrophils Relative %: 50 %
Platelet Count: 274 10*3/uL (ref 150–400)
RBC: 3.72 MIL/uL — ABNORMAL LOW (ref 3.87–5.11)
RDW: 18.6 % — ABNORMAL HIGH (ref 11.5–15.5)
Smear Review: NORMAL
WBC Count: 5 10*3/uL (ref 4.0–10.5)
nRBC: 0 % (ref 0.0–0.2)

## 2021-10-05 LAB — RETICULOCYTES
Immature Retic Fract: 24.2 % — ABNORMAL HIGH (ref 2.3–15.9)
RBC.: 3.7 MIL/uL — ABNORMAL LOW (ref 3.87–5.11)
Retic Count, Absolute: 90.7 10*3/uL (ref 19.0–186.0)
Retic Ct Pct: 2.5 % (ref 0.4–3.1)

## 2021-10-05 LAB — CMP (CANCER CENTER ONLY)
ALT: 25 U/L (ref 0–44)
AST: 17 U/L (ref 15–41)
Albumin: 3.8 g/dL (ref 3.5–5.0)
Alkaline Phosphatase: 76 U/L (ref 38–126)
Anion gap: 7 (ref 5–15)
BUN: 8 mg/dL (ref 6–20)
CO2: 30 mmol/L (ref 22–32)
Calcium: 9.3 mg/dL (ref 8.9–10.3)
Chloride: 106 mmol/L (ref 98–111)
Creatinine: 0.7 mg/dL (ref 0.44–1.00)
GFR, Estimated: >60 mL/min (ref >60)
Glucose, Bld: 106 mg/dL — ABNORMAL HIGH (ref 70–99)
Potassium: 3.2 mmol/L — ABNORMAL LOW (ref 3.5–5.1)
Sodium: 143 mmol/L (ref 135–145)
Total Bilirubin: 0.4 mg/dL (ref 0.3–1.2)
Total Protein: 5.9 g/dL — ABNORMAL LOW (ref 6.5–8.1)

## 2021-10-05 LAB — IRON AND IRON BINDING CAPACITY (CC-WL,HP ONLY)
Iron: 63 ug/dL (ref 28–170)
Saturation Ratios: 28 % (ref 10.4–31.8)
TIBC: 225 ug/dL — ABNORMAL LOW (ref 250–450)
UIBC: 162 ug/dL (ref 148–442)

## 2021-10-05 LAB — LACTATE DEHYDROGENASE: LDH: 147 U/L (ref 98–192)

## 2021-10-05 LAB — FERRITIN: Ferritin: 591 ng/mL — ABNORMAL HIGH (ref 11–307)

## 2021-10-05 MED ORDER — SODIUM CHLORIDE 0.9 % IV SOLN
165.0000 mg/m2 | Freq: Once | INTRAVENOUS | Status: AC
  Administered 2021-10-05: 400 mg via INTRAVENOUS
  Filled 2021-10-05: qty 15

## 2021-10-05 MED ORDER — SODIUM CHLORIDE 0.9 % IV SOLN
10.0000 mg | Freq: Once | INTRAVENOUS | Status: AC
  Administered 2021-10-05: 10 mg via INTRAVENOUS
  Filled 2021-10-05: qty 10

## 2021-10-05 MED ORDER — SODIUM CHLORIDE 0.9 % IV SOLN: Freq: Once | INTRAVENOUS | Status: AC

## 2021-10-05 MED ORDER — SODIUM CHLORIDE 0.9 % IV SOLN
400.0000 mg/m2 | Freq: Once | INTRAVENOUS | Status: AC
  Administered 2021-10-05: 956 mg via INTRAVENOUS
  Filled 2021-10-05: qty 47.8

## 2021-10-05 MED ORDER — SODIUM CHLORIDE 0.9 % IV SOLN
2400.0000 mg/m2 | INTRAVENOUS | Status: DC
  Administered 2021-10-05: 5750 mg via INTRAVENOUS
  Filled 2021-10-05: qty 100

## 2021-10-05 MED ORDER — PALONOSETRON HCL INJECTION 0.25 MG/5ML
0.2500 mg | Freq: Once | INTRAVENOUS | Status: AC
  Administered 2021-10-05: 0.25 mg via INTRAVENOUS
  Filled 2021-10-05: qty 5

## 2021-10-05 MED ORDER — ATROPINE SULFATE 1 MG/ML IV SOLN
0.5000 mg | Freq: Once | INTRAVENOUS | Status: AC | PRN
  Administered 2021-10-05: 0.5 mg via INTRAVENOUS
  Filled 2021-10-05: qty 1

## 2021-10-05 MED ORDER — SODIUM CHLORIDE 0.9 % IV SOLN
150.0000 mg | Freq: Once | INTRAVENOUS | Status: AC
  Administered 2021-10-05: 150 mg via INTRAVENOUS
  Filled 2021-10-05: qty 150

## 2021-10-05 NOTE — Patient Instructions (Signed)
[facility] [place]  Discharge Instructions: Thank you for choosing [facility] to provide your oncology and hematology care.   If you have a lab appointment with the [facility], please go directly to the [facility] and check in at the registration area.  Wear comfortable clothing and clothing appropriate for easy access to any Portacath or PICC line.   We strive to give you quality time with your provider. You may need to reschedule your appointment if you arrive late (15 or more minutes).  Arriving late affects you and other patients whose appointments are after yours.  Also, if you miss three or more appointments without notifying the office, you may be dismissed from the clinic at the provider's discretion.      For prescription refill requests, have your pharmacy contact our office and allow 72 hours for refills to be completed.    Today you received the following chemotherapy and/or immunotherapy agents leucovorin, 5-fu, cpt-11    To help prevent nausea and vomiting after your treatment, we encourage you to take your nausea medication as directed.  BELOW ARE SYMPTOMS THAT SHOULD BE REPORTED IMMEDIATELY: *FEVER GREATER THAN 100.4 F (38 C) OR HIGHER *CHILLS OR SWEATING *NAUSEA AND VOMITING THAT IS NOT CONTROLLED WITH YOUR NAUSEA MEDICATION *UNUSUAL SHORTNESS OF BREATH *UNUSUAL BRUISING OR BLEEDING *URINARY PROBLEMS (pain or burning when urinating, or frequent urination) *BOWEL PROBLEMS (unusual diarrhea, constipation, pain near the anus) TENDERNESS IN MOUTH AND THROAT WITH OR WITHOUT PRESENCE OF ULCERS (sore throat, sores in mouth, or a toothache) UNUSUAL RASH, SWELLING OR PAIN  UNUSUAL VAGINAL DISCHARGE OR ITCHING   Items with * indicate a potential emergency and should be followed up as soon as possible or go to the Emergency Department if any problems should occur.  Please show the CHEMOTHERAPY ALERT CARD or IMMUNOTHERAPY ALERT CARD at  check-in to the Emergency Department and triage nurse. Should you have questions after your visit or need to cancel or reschedule your appointment, please contact [facility]  [phone] and follow the prompts.  Office hours are 8:00 a.m. to 4:30 p.m. Monday - Friday. Please note that voicemails left after 4:00 p.m. may not be returned until the following business day.  We are closed weekends and major holidays. You have access to a nurse at all times for urgent questions. Please call the main number to the clinic [phone] and follow the prompts.  For any non-urgent questions, you may also contact your provider using MyChart. We now offer e-Visits for anyone 41 and older to request care online for non-urgent symptoms. For details visit [email or web address].   Also download the MyChart app! Go to the app store, search "MyChart", open the app, select [facility], and log in with your MyChart username and password.  Masks are optional in the cancer centers. If you would like for your care team to wear a mask while they are taking care of you, please let them know. For doctor visits, patients may have with them one support person who is at least 56 years old. At this time, visitors are not allowed in the infusion area.

## 2021-10-05 NOTE — Patient Instructions (Signed)
PICC Home Care Guide A peripherally inserted central catheter (PICC) is a form of IV access that allows medicines and IV fluids to be quickly put into the blood and spread throughout the body. The PICC is a long, thin, flexible tube (catheter) that is put into a vein in a person's arm or leg. The catheter ends in a large vein just outside the heart called the superior vena cava (SVC). After the PICC is put in, a chest X-ray may be done to make sure that it is in the right place. A PICC may be placed for different reasons, such as: To give medicines and liquid nutrition. To give IV fluids and blood products. To take blood samples often. If there is trouble placing a peripheral intravenous (PIV) catheter. If cared for properly, a PICC can remain in place for many months. Having a PICC can allow you to go home from the hospital sooner and continue treatment at home. Medicines and PICC care can be managed at home by a family member, caregiver, or home health care team. What are the risks? Generally, having a PICC is safe. However, problems may occur, including: A blood clot (thrombus) forming in or at the end of the PICC. A blood clot forming in a vein (deep vein thrombosis) or traveling to the lung (pulmonary embolism). Inflammation of the vein (phlebitis) in which the PICC is placed. Infection at the insertion site or in the blood. Blood infections from central lines, like PICCs, can be serious and often require a hospital stay. PICC malposition, or PICC movement or poor placement. A break or cut in the PICC. Do not use scissors near the PICC. Nerve or tendon irritation or injury during PICC insertion. How to care for your PICC Please follow the specific guidelines provided by your health care provider. Preventing infection You and any caregivers should wash your hands often with soap and water for at least 20 seconds. Wash hands: Before touching the PICC or the infusion device. Before changing a  bandage (dressing). Do not change the dressing unless you have been taught to do so and have shown you are able to change it safely. Flush the PICC as told. Tell your health care provider right away if the PICC is hard to flush or does not flush. Do not use force to flush the PICC. Use clean and germ-free (sterile) supplies only. Keep the supplies in a dry place. Do not reuse needles, syringes, or any other supplies. Reusing supplies can lead to infection. Keep the PICC dressing dry and secure it with tape if the edges stop sticking to your skin. Check your PICC insertion site every day for signs of infection. Check for: Redness, swelling, or pain. Fluid or blood. Warmth. Pus or a bad smell. Preventing other problems Do not use a syringe that is less than 10 mL to flush the PICC. Do not have your blood pressure checked on the arm in which the PICC is placed. Do not ever pull or tug on the PICC. Keep it secured to your arm with tape or a stretch wrap when not in use. Do not take the PICC out yourself. Only a trained health care provider should remove the PICC. Keep pets and children away from your PICC. How to care for your PICC dressing Keep your PICC dressing clean and dry to prevent infection. Do not take baths, swim, or use a hot tub until your health care provider approves. Ask your health care provider if you can take   showers. You may only be allowed to take sponge baths. When you are allowed to shower: Ask your health care provider to teach you how to wrap the PICC. Cover the PICC with clear plastic wrap and tape to keep it dry while showering. Follow instructions from your health care provider about how to take care of your insertion site and dressing. Make sure you: Wash your hands with soap and water for at least 20 seconds before and after you change your dressing. If soap and water are not available, use hand sanitizer. Change your dressing only if taught to do so by your health care  provider. Your PICC dressing needs to be changed if it becomes loose or wet. Leave stitches (sutures), skin glue, or adhesive strips in place. These skin closures may need to stay in place for 2 weeks or longer. If adhesive strip edges start to loosen and curl up, you may trim the loose edges. Do not remove adhesive strips completely unless your health care provider tells you to do that. Follow these instructions at home: Disposal of supplies Throw away any syringes in a disposal container that is meant for sharp items (sharps container). You can buy a sharps container from a pharmacy, or you can make one by using an empty, hard plastic bottle with a lid. Place any used dressings or infusion bags into a plastic bag. Throw that bag in the trash. General instructions  Always carry your PICC identification card or wear a medical alert bracelet. Keep the tube clamped at all times, unless it is being used. Always carry a smooth-edge clamp with you to clamp the PICC if it breaks. Do not use scissors or sharp objects near the tube. You may bend your arm and move it freely. If your PICC is near or at the bend of your elbow, avoid activity with repeated motion at the elbow. Avoid lifting heavy objects as told by your health care provider. Keep all follow-up visits. This is important. You will need to have your PICC dressing changed at least once a week. Contact a health care provider if: You have pain in your arm, ear, face, or teeth. You have a fever or chills. You have redness, swelling, or pain around the insertion site. You have fluid or blood coming from the insertion site. Your insertion site feels warm to the touch. You have pus or a bad smell coming from the insertion site. Your skin feels hard and raised around the insertion site. Your PICC dressing has gotten wet or is coming off and you have not been taught how to change it. Get help right away if: You have problems with your PICC, such as  your PICC: Was tugged or pulled and has partially come out. Do not  push the PICC back in. Cannot be flushed, is hard to flush, or leaks around the insertion site when it is flushed. Makes a flushing sound when it is flushed. Appears to have a hole or tear. Is accidentally pulled all the way out. If this happens, cover the insertion site with a gauze dressing. Do not throw the PICC away. Your health care provider will need to check it to be sure the entire catheter came out. You feel your heart racing or skipping beats, or you have chest pain. You have shortness of breath or trouble breathing. You have swelling, redness, warmth, or pain in the arm in which the PICC is placed. You have a red streak going up your arm that   starts under the PICC dressing. These symptoms may be an emergency. Get help right away. Call 911. Do not wait to see if the symptoms will go away. Do not drive yourself to the hospital. Summary A peripherally inserted central catheter (PICC) is a long, thin, flexible tube (catheter) that is put into a vein in the arm or leg. If cared for properly, a PICC can remain in place for many months. Having a PICC can allow you to go home from the hospital sooner and continue treatment at home. The PICC is inserted using a germ-free (sterile) technique by a specially trained health care provider. Only a trained health care provider should remove it. Do not have your blood pressure checked on the arm in which your PICC is placed. Always keep your PICC identification card with you. This information is not intended to replace advice given to you by your health care provider. Make sure you discuss any questions you have with your health care provider. Document Revised: 10/27/2020 Document Reviewed: 10/27/2020 Elsevier Patient Education  2023 Else[employer] Inc.  

## 2021-10-05 NOTE — Progress Notes (Signed)
Hematology and Oncology Follow Up Visit  Lauren Mcpherson [id number] [birth year] 56 y.o. 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 is here today for follow-up and treatment. She is doing well but still notes numbness and tingling in her lower extremities.  No falls or syncope to report.  CEA was stable at 3.16 last visit.  No fever, chills, n/v, cough, rash, dizziness, SOB, chest pain, palpitations, abdominal pain or changes in bowel or bladder habits.  No blood loss, bruising or petechiae noted.  No swelling or tenderness in her extremities.  Appetite and hydration have been good. Her weight is stable at 253 lbs.   ECOG Performance Status: 1 - Symptomatic but completely ambulatory  Medications:  Allergies as of 10/05/2021   No Known Allergies      Medication List        Accurate as of October 05, 2021  8:43 AM. If you have any questions, ask your nurse or doctor.          dexamethasone 6 MG tablet Commonly known as: DECADRON Take 1 tablet (6 mg total) by mouth See admin instructions. Take 6 mg by mouth daily for 3 days, starting the day after chemotherapy. Take with food.   loperamide 2 MG tablet Commonly known as: Imodium A-D Take 2 at onset of diarrhea, then 1 every 2hrs until 12hr without a BM. May take 2 tab every 4hrs at bedtime. If diarrhea recurs repeat.   ondansetron 8 MG tablet Commonly known as: Zofran Take 1 tablet (8 mg total) by mouth 2 (two) times daily as needed. Start on day 3 after chemotherapy.   prochlorperazine 10 MG tablet Commonly known as: COMPAZINE Take 1 tablet (10 mg total) by  mouth every 6 (six) hours as needed (Nausea or vomiting).   rivaroxaban 10 MG Tabs tablet Commonly known as: XARELTO Take 1 tablet (10 mg total) by mouth daily with supper.        Allergies: No Known Allergies  Past Medical History, Surgical history, Social history, and Family History were reviewed and updated.  Review of Systems: All other 10 point review of systems is negative.   Physical Exam:  height is 5' 4.17" (1.63 m) and weight is 253 lb 1.3 oz (114.8 kg). Her oral temperature is 98 F (36.7 C). Her blood pressure is 124/58 (abnormal) and her pulse is 79. Her respiration is 17 and oxygen saturation is 100%.   Wt Readings from Last 3 Encounters:  10/05/21 253 lb 1.3 oz (114.8 kg)  09/21/21 256 lb (116.1 kg)  09/07/21 254 lb (115.2 kg)    Ocular: Sclerae unicteric, pupils equal, round and reactive to light Ear-nose-throat: Oropharynx clear, dentition fair Lymphatic: No cervical or supraclavicular adenopathy Lungs no rales or rhonchi, good excursion bilaterally Heart regular rate and rhythm, no murmur appreciated Abd soft, nontender, positive bowel sounds MSK no focal spinal tenderness, no joint edema Neuro: non-focal, well-oriented, appropriate affect Breasts: Deferred   Lab Results  Component Value Date   WBC 5.0 10/05/2021   HGB 11.0 (L) 10/05/2021   HCT 34.4 (L) 10/05/2021   MCV 92.5  10/05/2021   PLT 274 10/05/2021   Lab Results  Component Value Date   FERRITIN 616 (H) 09/21/2021   IRON 53 09/21/2021   TIBC 230 (L) 09/21/2021   UIBC 177 09/21/2021   IRONPCTSAT 23 09/21/2021   Lab Results  Component Value Date   RETICCTPCT 2.5 10/05/2021   RBC 3.70 (L) 10/05/2021   No results found for: "KPAFRELGTCHN", "LAMBDASER", "KAPLAMBRATIO" No results found for: "IGGSERUM", "IGA", "IGMSERUM" No results found for: "TOTALPROTELP", "ALBUMINELP", "A1GS", "A2GS", "BETS", "BETA2SER", "GAMS", "MSPIKE", "SPEI"   Chemistry      Component Value Date/Time   NA 143  09/21/2021 0815   K 3.2 (L) 09/21/2021 0815   CL 106 09/21/2021 0815   CO2 29 09/21/2021 0815   BUN 9 09/21/2021 0815   CREATININE 0.69 09/21/2021 0815      Component Value Date/Time   CALCIUM 9.3 09/21/2021 0815   ALKPHOS 68 09/21/2021 0815   AST 17 09/21/2021 0815   ALT 24 09/21/2021 0815   BILITOT 0.3 09/21/2021 0815       Impression and Plan: [name] is a very pleasant 56 yo African American female with metastatic 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   We will proceed with treatment today as planned.  Her MRI or the abdomen is scheduled for this week on 6/17.  PICC line flushes 3 times a week.  Follow-up in 2 weeks.   Lauren Dawson, NP 6/14/20238:43 AM

## 2021-10-07 ENCOUNTER — Inpatient Hospital Stay: Payer: PRIVATE HEALTH INSURANCE

## 2021-10-07 VITALS — BP 103/66 | HR 88 | Temp 97.9°F | Resp 17

## 2021-10-07 DIAGNOSIS — Z5111 Encounter for antineoplastic chemotherapy: Secondary | ICD-10-CM | POA: Diagnosis not present

## 2021-10-07 DIAGNOSIS — C180 Malignant neoplasm of cecum: Secondary | ICD-10-CM

## 2021-10-07 MED ORDER — HEPARIN SOD (PORK) LOCK FLUSH 100 UNIT/ML IV SOLN: 500.0000 [IU] | Freq: Once | INTRAVENOUS | Status: DC

## 2021-10-07 MED ORDER — HEPARIN SOD (PORK) LOCK FLUSH 100 UNIT/ML IV SOLN
500.0000 [IU] | Freq: Once | INTRAVENOUS | Status: AC | PRN
  Administered 2021-10-07: 500 [IU]

## 2021-10-07 MED ORDER — PEGFILGRASTIM-BMEZ 6 MG/0.6ML ~~LOC~~ SOSY
6.0000 mg | PREFILLED_SYRINGE | Freq: Once | SUBCUTANEOUS | Status: AC
  Administered 2021-10-07: 6 mg via SUBCUTANEOUS
  Filled 2021-10-07: qty 0.6

## 2021-10-07 MED ORDER — SODIUM CHLORIDE 0.9% FLUSH
10.0000 mL | INTRAVENOUS | Status: DC | PRN
  Administered 2021-10-07: 10 mL

## 2021-10-07 MED ORDER — SODIUM CHLORIDE 0.9% FLUSH: 10.0000 mL | Freq: Once | INTRAVENOUS | Status: DC

## 2021-10-08 ENCOUNTER — Ambulatory Visit (HOSPITAL_BASED_OUTPATIENT_CLINIC_OR_DEPARTMENT_OTHER)
Admission: RE | Admit: 2021-10-08 | Discharge: 2021-10-08 | Disposition: A | Discharge status: Home / Self Care | Payer: PRIVATE HEALTH INSURANCE | Source: Ambulatory Visit | Attending: Family | Admitting: Family

## 2021-10-08 DIAGNOSIS — C787 Secondary malignant neoplasm of liver and intrahepatic bile duct: Secondary | ICD-10-CM | POA: Insufficient documentation

## 2021-10-08 DIAGNOSIS — C180 Malignant neoplasm of cecum: Secondary | ICD-10-CM | POA: Diagnosis present

## 2021-10-08 MED ORDER — GADOBUTROL 1 MMOL/ML IV SOLN
10.0000 mL | Freq: Once | INTRAVENOUS | Status: AC | PRN
  Administered 2021-10-08: 10 mL via INTRAVENOUS

## 2021-10-10 ENCOUNTER — Inpatient Hospital Stay: Payer: PRIVATE HEALTH INSURANCE

## 2021-10-10 ENCOUNTER — Telehealth: Payer: Self-pay | Admitting: *Deleted

## 2021-10-10 DIAGNOSIS — Z5111 Encounter for antineoplastic chemotherapy: Secondary | ICD-10-CM | POA: Diagnosis not present

## 2021-10-10 NOTE — Patient Instructions (Signed)
PICC Home Care Guide A peripherally inserted central catheter (PICC) is a form of IV access that allows medicines and IV fluids to be quickly put into the blood and spread throughout the body. The PICC is a long, thin, flexible tube (catheter) that is put into a vein in a person's arm or leg. The catheter ends in a large vein just outside the heart called the superior vena cava (SVC). After the PICC is put in, a chest X-ray may be done to make sure that it is in the right place. A PICC may be placed for different reasons, such as: To give medicines and liquid nutrition. To give IV fluids and blood products. To take blood samples often. If there is trouble placing a peripheral intravenous (PIV) catheter. If cared for properly, a PICC can remain in place for many months. Having a PICC can allow you to go home from the hospital sooner and continue treatment at home. Medicines and PICC care can be managed at home by a family member, caregiver, or home health care team. What are the risks? Generally, having a PICC is safe. However, problems may occur, including: A blood clot (thrombus) forming in or at the end of the PICC. A blood clot forming in a vein (deep vein thrombosis) or traveling to the lung (pulmonary embolism). Inflammation of the vein (phlebitis) in which the PICC is placed. Infection at the insertion site or in the blood. Blood infections from central lines, like PICCs, can be serious and often require a hospital stay. PICC malposition, or PICC movement or poor placement. A break or cut in the PICC. Do not use scissors near the PICC. Nerve or tendon irritation or injury during PICC insertion. How to care for your PICC Please follow the specific guidelines provided by your health care provider. Preventing infection You and any caregivers should wash your hands often with soap and water for at least 20 seconds. Wash hands: Before touching the PICC or the infusion device. Before changing a  bandage (dressing). Do not change the dressing unless you have been taught to do so and have shown you are able to change it safely. Flush the PICC as told. Tell your health care provider right away if the PICC is hard to flush or does not flush. Do not use force to flush the PICC. Use clean and germ-free (sterile) supplies only. Keep the supplies in a dry place. Do not reuse needles, syringes, or any other supplies. Reusing supplies can lead to infection. Keep the PICC dressing dry and secure it with tape if the edges stop sticking to your skin. Check your PICC insertion site every day for signs of infection. Check for: Redness, swelling, or pain. Fluid or blood. Warmth. Pus or a bad smell. Preventing other problems Do not use a syringe that is less than 10 mL to flush the PICC. Do not have your blood pressure checked on the arm in which the PICC is placed. Do not ever pull or tug on the PICC. Keep it secured to your arm with tape or a stretch wrap when not in use. Do not take the PICC out yourself. Only a trained health care provider should remove the PICC. Keep pets and children away from your PICC. How to care for your PICC dressing Keep your PICC dressing clean and dry to prevent infection. Do not take baths, swim, or use a hot tub until your health care provider approves. Ask your health care provider if you can take   showers. You may only be allowed to take sponge baths. When you are allowed to shower: Ask your health care provider to teach you how to wrap the PICC. Cover the PICC with clear plastic wrap and tape to keep it dry while showering. Follow instructions from your health care provider about how to take care of your insertion site and dressing. Make sure you: Wash your hands with soap and water for at least 20 seconds before and after you change your dressing. If soap and water are not available, use hand sanitizer. Change your dressing only if taught to do so by your health care  provider. Your PICC dressing needs to be changed if it becomes loose or wet. Leave stitches (sutures), skin glue, or adhesive strips in place. These skin closures may need to stay in place for 2 weeks or longer. If adhesive strip edges start to loosen and curl up, you may trim the loose edges. Do not remove adhesive strips completely unless your health care provider tells you to do that. Follow these instructions at home: Disposal of supplies Throw away any syringes in a disposal container that is meant for sharp items (sharps container). You can buy a sharps container from a pharmacy, or you can make one by using an empty, hard plastic bottle with a lid. Place any used dressings or infusion bags into a plastic bag. Throw that bag in the trash. General instructions  Always carry your PICC identification card or wear a medical alert bracelet. Keep the tube clamped at all times, unless it is being used. Always carry a smooth-edge clamp with you to clamp the PICC if it breaks. Do not use scissors or sharp objects near the tube. You may bend your arm and move it freely. If your PICC is near or at the bend of your elbow, avoid activity with repeated motion at the elbow. Avoid lifting heavy objects as told by your health care provider. Keep all follow-up visits. This is important. You will need to have your PICC dressing changed at least once a week. Contact a health care provider if: You have pain in your arm, ear, face, or teeth. You have a fever or chills. You have redness, swelling, or pain around the insertion site. You have fluid or blood coming from the insertion site. Your insertion site feels warm to the touch. You have pus or a bad smell coming from the insertion site. Your skin feels hard and raised around the insertion site. Your PICC dressing has gotten wet or is coming off and you have not been taught how to change it. Get help right away if: You have problems with your PICC, such as  your PICC: Was tugged or pulled and has partially come out. Do not  push the PICC back in. Cannot be flushed, is hard to flush, or leaks around the insertion site when it is flushed. Makes a flushing sound when it is flushed. Appears to have a hole or tear. Is accidentally pulled all the way out. If this happens, cover the insertion site with a gauze dressing. Do not throw the PICC away. Your health care provider will need to check it to be sure the entire catheter came out. You feel your heart racing or skipping beats, or you have chest pain. You have shortness of breath or trouble breathing. You have swelling, redness, warmth, or pain in the arm in which the PICC is placed. You have a red streak going up your arm that   starts under the PICC dressing. These symptoms may be an emergency. Get help right away. Call 911. Do not wait to see if the symptoms will go away. Do not drive yourself to the hospital. Summary A peripherally inserted central catheter (PICC) is a long, thin, flexible tube (catheter) that is put into a vein in the arm or leg. If cared for properly, a PICC can remain in place for many months. Having a PICC can allow you to go home from the hospital sooner and continue treatment at home. The PICC is inserted using a germ-free (sterile) technique by a specially trained health care provider. Only a trained health care provider should remove it. Do not have your blood pressure checked on the arm in which your PICC is placed. Always keep your PICC identification card with you. This information is not intended to replace advice given to you by your health care provider. Make sure you discuss any questions you have with your health care provider. Document Revised: 10/27/2020 Document Reviewed: 10/27/2020 Elsevier Patient Education  2023 Else[employer] Inc.  

## 2021-10-10 NOTE — Telephone Encounter (Signed)
-----   Message from Lauren Napoleon, MD sent at 10/10/2021  3:42 PM EDT ----- Please call let her know that the liver metastasis appear very stable.  There is nothing new that shows that the cancer is growing.  Thanks.  [name]

## 2021-10-10 NOTE — Telephone Encounter (Signed)
Per Dr Marin Olp, MRI is very stable!  Results called to [facility].

## 2021-10-12 ENCOUNTER — Inpatient Hospital Stay: Payer: PRIVATE HEALTH INSURANCE

## 2021-10-12 VITALS — BP 108/77 | HR 70 | Temp 97.9°F | Resp 17

## 2021-10-12 DIAGNOSIS — Z5111 Encounter for antineoplastic chemotherapy: Secondary | ICD-10-CM | POA: Diagnosis not present

## 2021-10-12 DIAGNOSIS — Z95828 Presence of other vascular implants and grafts: Secondary | ICD-10-CM

## 2021-10-12 MED ORDER — SODIUM CHLORIDE 0.9% FLUSH
10.0000 mL | Freq: Once | INTRAVENOUS | Status: AC
  Administered 2021-10-12: 10 mL via INTRAVENOUS

## 2021-10-12 MED ORDER — HEPARIN SOD (PORK) LOCK FLUSH 100 UNIT/ML IV SOLN
500.0000 [IU] | Freq: Once | INTRAVENOUS | Status: AC
  Administered 2021-10-12: 500 [IU] via INTRAVENOUS

## 2021-10-12 NOTE — Patient Instructions (Signed)

## 2021-10-14 ENCOUNTER — Inpatient Hospital Stay: Payer: PRIVATE HEALTH INSURANCE

## 2021-10-14 VITALS — BP 118/83 | Temp 99.1°F | Resp 103

## 2021-10-14 DIAGNOSIS — Z5111 Encounter for antineoplastic chemotherapy: Secondary | ICD-10-CM | POA: Diagnosis not present

## 2021-10-14 DIAGNOSIS — Z95828 Presence of other vascular implants and grafts: Secondary | ICD-10-CM

## 2021-10-14 MED ORDER — HEPARIN SOD (PORK) LOCK FLUSH 100 UNIT/ML IV SOLN
500.0000 [IU] | Freq: Once | INTRAVENOUS | Status: AC
  Administered 2021-10-14: 250 [IU] via INTRAVENOUS

## 2021-10-14 MED ORDER — SODIUM CHLORIDE 0.9% FLUSH
10.0000 mL | INTRAVENOUS | Status: DC | PRN
  Administered 2021-10-14: 10 mL via INTRAVENOUS

## 2021-10-17 ENCOUNTER — Inpatient Hospital Stay: Payer: PRIVATE HEALTH INSURANCE

## 2021-10-17 VITALS — BP 128/84 | HR 91 | Temp 98.9°F

## 2021-10-17 DIAGNOSIS — Z5111 Encounter for antineoplastic chemotherapy: Secondary | ICD-10-CM | POA: Diagnosis not present

## 2021-10-17 DIAGNOSIS — C180 Malignant neoplasm of cecum: Secondary | ICD-10-CM

## 2021-10-17 MED ORDER — HEPARIN SOD (PORK) LOCK FLUSH 100 UNIT/ML IV SOLN
250.0000 [IU] | Freq: Once | INTRAVENOUS | Status: AC
  Administered 2021-10-17: 250 [IU]

## 2021-10-17 MED ORDER — SODIUM CHLORIDE 0.9% FLUSH
10.0000 mL | Freq: Once | INTRAVENOUS | Status: AC
  Administered 2021-10-17: 10 mL

## 2021-10-19 ENCOUNTER — Inpatient Hospital Stay: Payer: PRIVATE HEALTH INSURANCE

## 2021-10-19 ENCOUNTER — Inpatient Hospital Stay (HOSPITAL_BASED_OUTPATIENT_CLINIC_OR_DEPARTMENT_OTHER): Payer: PRIVATE HEALTH INSURANCE | Admitting: Family

## 2021-10-19 ENCOUNTER — Other Ambulatory Visit: Payer: Self-pay

## 2021-10-19 ENCOUNTER — Encounter: Payer: Self-pay | Admitting: Family

## 2021-10-19 VITALS — BP 121/89 | HR 82 | Temp 98.2°F | Resp 18 | Ht 64.0 in | Wt 256.8 lb

## 2021-10-19 DIAGNOSIS — D509 Iron deficiency anemia, unspecified: Secondary | ICD-10-CM | POA: Diagnosis not present

## 2021-10-19 DIAGNOSIS — C787 Secondary malignant neoplasm of liver and intrahepatic bile duct: Secondary | ICD-10-CM | POA: Diagnosis not present

## 2021-10-19 DIAGNOSIS — C180 Malignant neoplasm of cecum: Secondary | ICD-10-CM

## 2021-10-19 DIAGNOSIS — Z5111 Encounter for antineoplastic chemotherapy: Secondary | ICD-10-CM | POA: Diagnosis not present

## 2021-10-19 LAB — CMP (CANCER CENTER ONLY)
ALT: 16 U/L (ref 0–44)
AST: 14 U/L — ABNORMAL LOW (ref 15–41)
Albumin: 3.7 g/dL (ref 3.5–5.0)
Alkaline Phosphatase: 73 U/L (ref 38–126)
Anion gap: 8 (ref 5–15)
BUN: 9 mg/dL (ref 6–20)
CO2: 28 mmol/L (ref 22–32)
Calcium: 9.1 mg/dL (ref 8.9–10.3)
Chloride: 105 mmol/L (ref 98–111)
Creatinine: 0.76 mg/dL (ref 0.44–1.00)
GFR, Estimated: >60 mL/min (ref >60)
Glucose, Bld: 104 mg/dL — ABNORMAL HIGH (ref 70–99)
Potassium: 3.8 mmol/L (ref 3.5–5.1)
Sodium: 141 mmol/L (ref 135–145)
Total Bilirubin: 0.3 mg/dL (ref 0.3–1.2)
Total Protein: 6.1 g/dL — ABNORMAL LOW (ref 6.5–8.1)

## 2021-10-19 LAB — IRON AND IRON BINDING CAPACITY (CC-WL,HP ONLY)
Iron: 75 ug/dL (ref 28–170)
Saturation Ratios: 35 % — ABNORMAL HIGH (ref 10.4–31.8)
TIBC: 217 ug/dL — ABNORMAL LOW (ref 250–450)
UIBC: 142 ug/dL — ABNORMAL LOW (ref 148–442)

## 2021-10-19 LAB — CBC WITH DIFFERENTIAL (CANCER CENTER ONLY)
Abs Immature Granulocytes: 0.06 10*3/uL (ref 0.00–0.07)
Basophils Absolute: 0.1 10*3/uL (ref 0.0–0.1)
Basophils Relative: 1 %
Eosinophils Absolute: 0.1 10*3/uL (ref 0.0–0.5)
Eosinophils Relative: 3 %
HCT: 36.4 % (ref 36.0–46.0)
Hemoglobin: 11.5 g/dL — ABNORMAL LOW (ref 12.0–15.0)
Immature Granulocytes: 1 %
Lymphocytes Relative: 38 %
Lymphs Abs: 1.8 10*3/uL (ref 0.7–4.0)
MCH: 29.8 pg (ref 26.0–34.0)
MCHC: 31.6 g/dL (ref 30.0–36.0)
MCV: 94.3 fL (ref 80.0–100.0)
Monocytes Absolute: 0.7 10*3/uL (ref 0.1–1.0)
Monocytes Relative: 15 %
Neutro Abs: 2 10*3/uL (ref 1.7–7.7)
Neutrophils Relative %: 42 %
Platelet Count: 242 10*3/uL (ref 150–400)
RBC: 3.86 MIL/uL — ABNORMAL LOW (ref 3.87–5.11)
RDW: 19.7 % — ABNORMAL HIGH (ref 11.5–15.5)
Smear Review: NORMAL
WBC Count: 4.8 10*3/uL (ref 4.0–10.5)
nRBC: 0 % (ref 0.0–0.2)

## 2021-10-19 LAB — RETICULOCYTES
Immature Retic Fract: 19.8 % — ABNORMAL HIGH (ref 2.3–15.9)
RBC.: 3.87 MIL/uL (ref 3.87–5.11)
Retic Count, Absolute: 111.5 10*3/uL (ref 19.0–186.0)
Retic Ct Pct: 2.9 % (ref 0.4–3.1)

## 2021-10-19 LAB — CEA (IN HOUSE-CHCC): CEA (CHCC-In House): 3.39 ng/mL (ref 0.00–5.00)

## 2021-10-19 LAB — FERRITIN: Ferritin: 507 ng/mL — ABNORMAL HIGH (ref 11–307)

## 2021-10-19 LAB — LACTATE DEHYDROGENASE: LDH: 149 U/L (ref 98–192)

## 2021-10-19 MED ORDER — HEPARIN SOD (PORK) LOCK FLUSH 100 UNIT/ML IV SOLN: 250.0000 [IU] | Freq: Once | INTRAVENOUS | Status: DC | PRN

## 2021-10-19 MED ORDER — SODIUM CHLORIDE 0.9 % IV SOLN
400.0000 mg/m2 | Freq: Once | INTRAVENOUS | Status: AC
  Administered 2021-10-19: 956 mg via INTRAVENOUS
  Filled 2021-10-19: qty 47.8

## 2021-10-19 MED ORDER — ATROPINE SULFATE 1 MG/ML IV SOLN
0.5000 mg | Freq: Once | INTRAVENOUS | Status: AC | PRN
  Administered 2021-10-19: 0.5 mg via INTRAVENOUS
  Filled 2021-10-19: qty 1

## 2021-10-19 MED ORDER — SODIUM CHLORIDE 0.9 % IV SOLN
150.0000 mg | Freq: Once | INTRAVENOUS | Status: AC
  Administered 2021-10-19: 150 mg via INTRAVENOUS
  Filled 2021-10-19: qty 150

## 2021-10-19 MED ORDER — SODIUM CHLORIDE 0.9 % IV SOLN
2400.0000 mg/m2 | INTRAVENOUS | Status: DC
  Administered 2021-10-19: 5750 mg via INTRAVENOUS
  Filled 2021-10-19: qty 115

## 2021-10-19 MED ORDER — SODIUM CHLORIDE 0.9 % IV SOLN
165.0000 mg/m2 | Freq: Once | INTRAVENOUS | Status: AC
  Administered 2021-10-19: 400 mg via INTRAVENOUS
  Filled 2021-10-19: qty 15

## 2021-10-19 MED ORDER — ACETAMINOPHEN 325 MG PO TABS
650.0000 mg | ORAL_TABLET | Freq: Once | ORAL | Status: AC
  Administered 2021-10-19: 650 mg via ORAL
  Filled 2021-10-19: qty 2

## 2021-10-19 MED ORDER — SODIUM CHLORIDE 0.9 % IV SOLN
10.0000 mg | Freq: Once | INTRAVENOUS | Status: AC
  Administered 2021-10-19: 10 mg via INTRAVENOUS
  Filled 2021-10-19: qty 10

## 2021-10-19 MED ORDER — PALONOSETRON HCL INJECTION 0.25 MG/5ML
0.2500 mg | Freq: Once | INTRAVENOUS | Status: AC
  Administered 2021-10-19: 0.25 mg via INTRAVENOUS
  Filled 2021-10-19: qty 5

## 2021-10-19 MED ORDER — SODIUM CHLORIDE 0.9 % IV SOLN: Freq: Once | INTRAVENOUS | Status: AC

## 2021-10-19 MED ORDER — SODIUM CHLORIDE 0.9% FLUSH: 10.0000 mL | INTRAVENOUS | Status: DC | PRN

## 2021-10-19 NOTE — Patient Instructions (Signed)

## 2021-10-19 NOTE — Progress Notes (Signed)
Hematology and Oncology Follow Up Visit  Lauren Mcpherson [id number] 1965-05-29 56 y.o. 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 She is doing well and has no complaints at this time.  CEA last month was stable at 3.16.  No issues with blood loss. No bruising or petechiae.  No fever, chills, n/v, cough, rash, dizziness, SOB, chest pain, palpitations, abdominal pain or changes in bowel or bladder habits.  No swelling in her extremities.  Neuropathy in her lower extremities unchanged from baseline.  No falls or syncope to report. She is enjoying getting outside for walks and sitting on her porch.  Appetite is good and she is staying well hydrated. Her weight is stable at 256 lbs.  She is excited for her anniversary and birthday next week!  ECOG Performance Status: 1 - Symptomatic but completely ambulatory  Medications:  Allergies as of 10/19/2021   No Known Allergies      Medication List        Accurate as of October 19, 2021  9:58 AM. If you have any questions, ask your nurse or doctor.          dexamethasone 6 MG tablet Commonly known as: DECADRON Take 1 tablet (6 mg total) by mouth See admin instructions. Take 6 mg by mouth daily for 3 days, starting the day after chemotherapy. Take with food.   loperamide 2 MG tablet Commonly known as: Imodium A-D Take 2 at onset of diarrhea, then 1 every 2hrs until 12hr without a BM. May take 2 tab every 4hrs at bedtime. If diarrhea recurs repeat.   ondansetron 8 MG tablet Commonly known as: Zofran Take 1 tablet (8 mg total) by  mouth 2 (two) times daily as needed. Start on day 3 after chemotherapy.   prochlorperazine 10 MG tablet Commonly known as: COMPAZINE Take 1 tablet (10 mg total) by mouth every 6 (six) hours as needed (Nausea or vomiting).   rivaroxaban 10 MG Tabs tablet Commonly known as: XARELTO Take 1 tablet (10 mg total) by mouth daily with supper.        Allergies: No Known Allergies  Past Medical History, Surgical history, Social history, and Family History were reviewed and updated.  Review of Systems: All other 10 point review of systems is negative.   Physical Exam:  height is 5' 4"  (1.626 m) and weight is 256 lb 12.8 oz (116.5 kg). Her oral temperature is 98.2 F (36.8 C). Her blood pressure is 121/89 and her pulse is 82. Her respiration is 18 and oxygen saturation is 98%.   Wt Readings from Last 3 Encounters:  10/19/21 256 lb 12.8 oz (116.5 kg)  10/05/21 253 lb 1.3 oz (114.8 kg)  09/21/21 256 lb (116.1 kg)    Ocular: Sclerae unicteric, pupils equal, round and reactive to light Ear-nose-throat: Oropharynx clear, dentition fair Lymphatic: No cervical or supraclavicular adenopathy Lungs no rales or rhonchi, good excursion bilaterally Heart regular rate and rhythm, no murmur appreciated Abd soft, nontender, positive bowel sounds MSK no focal spinal tenderness, no joint edema Neuro: non-focal, well-oriented, appropriate  affect Breasts: Deferred   Lab Results  Component Value Date   WBC 4.8 10/19/2021   HGB 11.5 (L) 10/19/2021   HCT 36.4 10/19/2021   MCV 94.3 10/19/2021   PLT 242 10/19/2021   Lab Results  Component Value Date   FERRITIN 591 (H) 10/05/2021   IRON 63 10/05/2021   TIBC 225 (L) 10/05/2021   UIBC 162 10/05/2021   IRONPCTSAT 28 10/05/2021   Lab Results  Component Value Date   RETICCTPCT 2.9 10/19/2021   RBC 3.86 (L) 10/19/2021   RBC 3.87 10/19/2021   No results found for: "KPAFRELGTCHN", "LAMBDASER", "KAPLAMBRATIO" No results found for: "IGGSERUM", "IGA",  "IGMSERUM" No results found for: "TOTALPROTELP", "ALBUMINELP", "A1GS", "A2GS", "BETS", "BETA2SER", "GAMS", "MSPIKE", "SPEI"   Chemistry      Component Value Date/Time   NA 141 10/19/2021 0821   K 3.8 10/19/2021 0821   CL 105 10/19/2021 0821   CO2 28 10/19/2021 0821   BUN 9 10/19/2021 0821   CREATININE 0.76 10/19/2021 0821      Component Value Date/Time   CALCIUM 9.1 10/19/2021 0821   ALKPHOS 73 10/19/2021 0821   AST 14 (L) 10/19/2021 0821   ALT 16 10/19/2021 0821   BILITOT 0.3 10/19/2021 0821       Impression and Plan: Lauren Mcpherson is a very pleasant 56 yo African American female with metastatic 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   We will proceed with treatment today.  MRI last week showed stable disease.  PICC flushes 3 times a week and follow-up in 2 weeks.   Lottie Dawson, NP 6/28/20239:58 AM

## 2021-10-19 NOTE — Patient Instructions (Signed)
[facility] [place]  Discharge Instructions: Thank you for choosing [facility] to provide your oncology and hematology care.   If you have a lab appointment with the [facility], please go directly to the [facility] and check in at the registration area.  Wear comfortable clothing and clothing appropriate for easy access to any Portacath or PICC line.   We strive to give you quality time with your provider. You may need to reschedule your appointment if you arrive late (15 or more minutes).  Arriving late affects you and other patients whose appointments are after yours.  Also, if you miss three or more appointments without notifying the office, you may be dismissed from the clinic at the provider's discretion.      For prescription refill requests, have your pharmacy contact our office and allow 72 hours for refills to be completed.    Today you received the following chemotherapy and/or immunotherapy agents FOLFIRI      To help prevent nausea and vomiting after your treatment, we encourage you to take your nausea medication as directed.  BELOW ARE SYMPTOMS THAT SHOULD BE REPORTED IMMEDIATELY: *FEVER GREATER THAN 100.4 F (38 C) OR HIGHER *CHILLS OR SWEATING *NAUSEA AND VOMITING THAT IS NOT CONTROLLED WITH YOUR NAUSEA MEDICATION *UNUSUAL SHORTNESS OF BREATH *UNUSUAL BRUISING OR BLEEDING *URINARY PROBLEMS (pain or burning when urinating, or frequent urination) *BOWEL PROBLEMS (unusual diarrhea, constipation, pain near the anus) TENDERNESS IN MOUTH AND THROAT WITH OR WITHOUT PRESENCE OF ULCERS (sore throat, sores in mouth, or a toothache) UNUSUAL RASH, SWELLING OR PAIN  UNUSUAL VAGINAL DISCHARGE OR ITCHING   Items with * indicate a potential emergency and should be followed up as soon as possible or go to the Emergency Department if any problems should occur.  Please show the CHEMOTHERAPY ALERT CARD or IMMUNOTHERAPY ALERT CARD at check-in to the  Emergency Department and triage nurse. Should you have questions after your visit or need to cancel or reschedule your appointment, please contact [facility]  [phone] and follow the prompts.  Office hours are 8:00 a.m. to 4:30 p.m. Monday - Friday. Please note that voicemails left after 4:00 p.m. may not be returned until the following business day.  We are closed weekends and major holidays. You have access to a nurse at all times for urgent questions. Please call the main number to the clinic [phone] and follow the prompts.  For any non-urgent questions, you may also contact your provider using MyChart. We now offer e-Visits for anyone 56 and older to request care online for non-urgent symptoms. For details visit [email or web address].   Also download the MyChart app! Go to the app store, search "MyChart", open the app, select [facility], and log in with your MyChart username and password.  Masks are optional in the cancer centers. If you would like for your care team to wear a mask while they are taking care of you, please let them know. For doctor visits, patients may have with them one support person who is at least 56 years old. At this time, visitors are not allowed in the infusion area.

## 2021-10-21 ENCOUNTER — Inpatient Hospital Stay: Payer: PRIVATE HEALTH INSURANCE

## 2021-10-21 VITALS — BP 107/69 | HR 91 | Temp 98.0°F | Resp 16

## 2021-10-21 DIAGNOSIS — Z5111 Encounter for antineoplastic chemotherapy: Secondary | ICD-10-CM | POA: Diagnosis not present

## 2021-10-21 DIAGNOSIS — C180 Malignant neoplasm of cecum: Secondary | ICD-10-CM

## 2021-10-21 MED ORDER — PEGFILGRASTIM-BMEZ 6 MG/0.6ML ~~LOC~~ SOSY
6.0000 mg | PREFILLED_SYRINGE | Freq: Once | SUBCUTANEOUS | Status: AC
  Administered 2021-10-21: 6 mg via SUBCUTANEOUS
  Filled 2021-10-21: qty 0.6

## 2021-10-21 MED ORDER — SODIUM CHLORIDE 0.9% FLUSH
10.0000 mL | INTRAVENOUS | Status: DC | PRN
  Administered 2021-10-21: 10 mL

## 2021-10-21 MED ORDER — HEPARIN SOD (PORK) LOCK FLUSH 100 UNIT/ML IV SOLN
250.0000 [IU] | Freq: Once | INTRAVENOUS | Status: AC | PRN
  Administered 2021-10-21: 250 [IU]

## 2021-10-21 NOTE — Patient Instructions (Signed)
Pegfilgrastim Injection What is this medication? PEGFILGRASTIM (PEG fil gra stim) lowers the risk of infection in people who are receiving chemotherapy. It works by helping your body make more white blood cells, which protects your body from infection. It may also be used to help people who have been exposed to high doses of radiation. This medicine may be used for other purposes; ask your health care provider or pharmacist if you have questions. COMMON BRAND NAME(S): Fulphila, Fylnetra, Neulasta, Nyvepria, Stimufend, UDENYCA, Ziextenzo What should I tell my care team before I take this medication? They need to know if you have any of these conditions: Kidney disease Latex allergy Ongoing radiation therapy Sickle cell disease Skin reactions to acrylic adhesives (On-Body Injector only) An unusual or allergic reaction to pegfilgrastim, filgrastim, other medications, foods, dyes, or preservatives Pregnant or trying to get pregnant Breast-feeding How should I use this medication? This medication is for injection under the skin. If you get this medication at home, you will be taught how to prepare and give the pre-filled syringe or how to use the On-body Injector. Refer to the patient Instructions for Use for detailed instructions. Use exactly as directed. Tell your care team immediately if you suspect that the On-body Injector may not have performed as intended or if you suspect the use of the On-body Injector resulted in a missed or partial dose. It is important that you put your used needles and syringes in a special sharps container. Do not put them in a trash can. If you do not have a sharps container, call your pharmacist or care team to get one. Talk to your care team about the use of this medication in children. While this medication may be prescribed for selected conditions, precautions do apply. Overdosage: If you think you have taken too much of this medicine contact a poison control center  or emergency room at once. NOTE: This medicine is only for you. Do not share this medicine with others. What if I miss a dose? It is important not to miss your dose. Call your care team if you miss your dose. If you miss a dose due to an On-body Injector failure or leakage, a new dose should be administered as soon as possible using a single prefilled syringe for manual use. What may interact with this medication? Interactions have not been studied. This list may not describe all possible interactions. Give your health care provider a list of all the medicines, herbs, non-prescription drugs, or dietary supplements you use. Also tell them if you smoke, drink alcohol, or use illegal drugs. Some items may interact with your medicine. What should I watch for while using this medication? Your condition will be monitored carefully while you are receiving this medication. You may need blood work done while you are taking this medication. Talk to your care team about your risk of cancer. You may be more at risk for certain types of cancer if you take this medication. If you are going to need a MRI, CT scan, or other procedure, tell your care team that you are using this medication (On-Body Injector only). What side effects may I notice from receiving this medication? Side effects that you should report to your care team as soon as possible: Allergic reactions--skin rash, itching, hives, swelling of the face, lips, tongue, or throat Capillary leak syndrome--stomach or muscle pain, unusual weakness or fatigue, feeling faint or lightheaded, decrease in the amount of urine, swelling of the ankles, hands, or feet, trouble   breathing High white blood cell level--fever, fatigue, trouble breathing, night sweats, change in vision, weight loss Inflammation of the aorta--fever, fatigue, back, chest, or stomach pain, severe headache Kidney injury (glomerulonephritis)--decrease in the amount of urine, red or dark Himmelberger  urine, foamy or bubbly urine, swelling of the ankles, hands, or feet Shortness of breath or trouble breathing Spleen injury--pain in upper left stomach or shoulder Unusual bruising or bleeding Side effects that usually do not require medical attention (report to your care team if they continue or are bothersome): Bone pain Pain in the hands or feet This list may not describe all possible side effects. Call your doctor for medical advice about side effects. You may report side effects to FDA at 1-800-FDA-1088. Where should I keep my medication? Keep out of the reach of children. If you are using this medication at home, you will be instructed on how to store it. Throw away any unused medication after the expiration date on the label. NOTE: This sheet is a summary. It may not cover all possible information. If you have questions about this medicine, talk to your doctor, pharmacist, or health care provider.  2023 Elsevier/Gold Standard (2021-03-11 00:00:00)  [facility]  Discharge Instructions: Thank you for choosing [facility] to provide your oncology and hematology care.   If you have a lab appointment with the [facility], please go directly to the [facility] and check in at the registration area.  Wear comfortable clothing and clothing appropriate for easy access to any Portacath or PICC line.   We strive to give you quality time with your provider. You may need to reschedule your appointment if you arrive late (15 or more minutes).  Arriving late affects you and other patients whose appointments are after yours.  Also, if you miss three or more appointments without notifying the office, you may be dismissed from the clinic at the provider's discretion.      For prescription refill requests, have your pharmacy contact our office and allow 72 hours for refills to be completed.    Today you received the following chemotherapy and/or  immunotherapy agents 29f      To help prevent nausea and vomiting after your treatment, we encourage you to take your nausea medication as directed.  BELOW ARE SYMPTOMS THAT SHOULD BE REPORTED IMMEDIATELY: *FEVER GREATER THAN 100.4 F (38 C) OR HIGHER *CHILLS OR SWEATING *NAUSEA AND VOMITING THAT IS NOT CONTROLLED WITH YOUR NAUSEA MEDICATION *UNUSUAL SHORTNESS OF BREATH *UNUSUAL BRUISING OR BLEEDING *URINARY PROBLEMS (pain or burning when urinating, or frequent urination) *BOWEL PROBLEMS (unusual diarrhea, constipation, pain near the anus) TENDERNESS IN MOUTH AND THROAT WITH OR WITHOUT PRESENCE OF ULCERS (sore throat, sores in mouth, or a toothache) UNUSUAL RASH, SWELLING OR PAIN  UNUSUAL VAGINAL DISCHARGE OR ITCHING   Items with * indicate a potential emergency and should be followed up as soon as possible or go to the Emergency Department if any problems should occur.  Please show the CHEMOTHERAPY ALERT CARD or IMMUNOTHERAPY ALERT CARD at check-in to the Emergency Department and triage nurse. Should you have questions after your visit or need to cancel or reschedule your appointment, please contact [facility] [phone] follow the prompts.  Office hours are 8:00 a.m. to 4:30 p.m. Monday - Friday. Please note that voicemails left after 4:00 p.m. may not be returned until the following business day.  We are closed weekends and  major holidays. You have access to a nurse at all times for urgent questions. Please call the main number to the clinic [phone] and follow the prompts.  For any non-urgent questions, you may also contact your provider using MyChart. We now offer e-Visits for anyone 12 and older to request care online for non-urgent symptoms. For details visit [email or web address].   Also download the MyChart app! Go to the app store, search "MyChart", open the app, select , and log in with your MyChart username and password.  Masks  are optional in the cancer centers. If you would like for your care team to wear a mask while they are taking care of you, please let them know. For doctor visits, patients may have with them one support person who is at least 56 years old. At this time, visitors are not allowed in the infusion area.

## 2021-10-24 ENCOUNTER — Inpatient Hospital Stay: Payer: PRIVATE HEALTH INSURANCE | Attending: Hematology & Oncology

## 2021-10-24 VITALS — BP 108/70 | HR 87 | Temp 98.2°F | Resp 17

## 2021-10-24 DIAGNOSIS — Z5111 Encounter for antineoplastic chemotherapy: Secondary | ICD-10-CM | POA: Diagnosis not present

## 2021-10-24 DIAGNOSIS — C180 Malignant neoplasm of cecum: Secondary | ICD-10-CM | POA: Diagnosis present

## 2021-10-24 DIAGNOSIS — Z452 Encounter for adjustment and management of vascular access device: Secondary | ICD-10-CM | POA: Diagnosis not present

## 2021-10-24 DIAGNOSIS — C787 Secondary malignant neoplasm of liver and intrahepatic bile duct: Secondary | ICD-10-CM | POA: Insufficient documentation

## 2021-10-24 DIAGNOSIS — Z5189 Encounter for other specified aftercare: Secondary | ICD-10-CM | POA: Insufficient documentation

## 2021-10-24 DIAGNOSIS — Z7901 Long term (current) use of anticoagulants: Secondary | ICD-10-CM | POA: Diagnosis not present

## 2021-10-24 DIAGNOSIS — Z86718 Personal history of other venous thrombosis and embolism: Secondary | ICD-10-CM | POA: Diagnosis not present

## 2021-10-24 MED ORDER — HEPARIN SOD (PORK) LOCK FLUSH 100 UNIT/ML IV SOLN
500.0000 [IU] | Freq: Once | INTRAVENOUS | Status: AC
  Administered 2021-10-24: 250 [IU]

## 2021-10-24 MED ORDER — SODIUM CHLORIDE 0.9% FLUSH
10.0000 mL | Freq: Once | INTRAVENOUS | Status: AC
  Administered 2021-10-24: 10 mL

## 2021-10-24 NOTE — Patient Instructions (Signed)
PICC Home Care Guide A peripherally inserted central catheter (PICC) is a form of IV access that allows medicines and IV fluids to be quickly put into the blood and spread throughout the body. The PICC is a long, thin, flexible tube (catheter) that is put into a vein in a person's arm or leg. The catheter ends in a large vein just outside the heart called the superior vena cava (SVC). After the PICC is put in, a chest X-ray may be done to make sure that it is in the right place. A PICC may be placed for different reasons, such as: To give medicines and liquid nutrition. To give IV fluids and blood products. To take blood samples often. If there is trouble placing a peripheral intravenous (PIV) catheter. If cared for properly, a PICC can remain in place for many months. Having a PICC can allow you to go home from the hospital sooner and continue treatment at home. Medicines and PICC care can be managed at home by a family member, caregiver, or home health care team. What are the risks? Generally, having a PICC is safe. However, problems may occur, including: A blood clot (thrombus) forming in or at the end of the PICC. A blood clot forming in a vein (deep vein thrombosis) or traveling to the lung (pulmonary embolism). Inflammation of the vein (phlebitis) in which the PICC is placed. Infection at the insertion site or in the blood. Blood infections from central lines, like PICCs, can be serious and often require a hospital stay. PICC malposition, or PICC movement or poor placement. A break or cut in the PICC. Do not use scissors near the PICC. Nerve or tendon irritation or injury during PICC insertion. How to care for your PICC Please follow the specific guidelines provided by your health care provider. Preventing infection You and any caregivers should wash your hands often with soap and water for at least 20 seconds. Wash hands: Before touching the PICC or the infusion device. Before changing a  bandage (dressing). Do not change the dressing unless you have been taught to do so and have shown you are able to change it safely. Flush the PICC as told. Tell your health care provider right away if the PICC is hard to flush or does not flush. Do not use force to flush the PICC. Use clean and germ-free (sterile) supplies only. Keep the supplies in a dry place. Do not reuse needles, syringes, or any other supplies. Reusing supplies can lead to infection. Keep the PICC dressing dry and secure it with tape if the edges stop sticking to your skin. Check your PICC insertion site every day for signs of infection. Check for: Redness, swelling, or pain. Fluid or blood. Warmth. Pus or a bad smell. Preventing other problems Do not use a syringe that is less than 10 mL to flush the PICC. Do not have your blood pressure checked on the arm in which the PICC is placed. Do not ever pull or tug on the PICC. Keep it secured to your arm with tape or a stretch wrap when not in use. Do not take the PICC out yourself. Only a trained health care provider should remove the PICC. Keep pets and children away from your PICC. How to care for your PICC dressing Keep your PICC dressing clean and dry to prevent infection. Do not take baths, swim, or use a hot tub until your health care provider approves. Ask your health care provider if you can take   showers. You may only be allowed to take sponge baths. When you are allowed to shower: Ask your health care provider to teach you how to wrap the PICC. Cover the PICC with clear plastic wrap and tape to keep it dry while showering. Follow instructions from your health care provider about how to take care of your insertion site and dressing. Make sure you: Wash your hands with soap and water for at least 20 seconds before and after you change your dressing. If soap and water are not available, use hand sanitizer. Change your dressing only if taught to do so by your health care  provider. Your PICC dressing needs to be changed if it becomes loose or wet. Leave stitches (sutures), skin glue, or adhesive strips in place. These skin closures may need to stay in place for 2 weeks or longer. If adhesive strip edges start to loosen and curl up, you may trim the loose edges. Do not remove adhesive strips completely unless your health care provider tells you to do that. Follow these instructions at home: Disposal of supplies Throw away any syringes in a disposal container that is meant for sharp items (sharps container). You can buy a sharps container from a pharmacy, or you can make one by using an empty, hard plastic bottle with a lid. Place any used dressings or infusion bags into a plastic bag. Throw that bag in the trash. General instructions  Always carry your PICC identification card or wear a medical alert bracelet. Keep the tube clamped at all times, unless it is being used. Always carry a smooth-edge clamp with you to clamp the PICC if it breaks. Do not use scissors or sharp objects near the tube. You may bend your arm and move it freely. If your PICC is near or at the bend of your elbow, avoid activity with repeated motion at the elbow. Avoid lifting heavy objects as told by your health care provider. Keep all follow-up visits. This is important. You will need to have your PICC dressing changed at least once a week. Contact a health care provider if: You have pain in your arm, ear, face, or teeth. You have a fever or chills. You have redness, swelling, or pain around the insertion site. You have fluid or blood coming from the insertion site. Your insertion site feels warm to the touch. You have pus or a bad smell coming from the insertion site. Your skin feels hard and raised around the insertion site. Your PICC dressing has gotten wet or is coming off and you have not been taught how to change it. Get help right away if: You have problems with your PICC, such as  your PICC: Was tugged or pulled and has partially come out. Do not  push the PICC back in. Cannot be flushed, is hard to flush, or leaks around the insertion site when it is flushed. Makes a flushing sound when it is flushed. Appears to have a hole or tear. Is accidentally pulled all the way out. If this happens, cover the insertion site with a gauze dressing. Do not throw the PICC away. Your health care provider will need to check it to be sure the entire catheter came out. You feel your heart racing or skipping beats, or you have chest pain. You have shortness of breath or trouble breathing. You have swelling, redness, warmth, or pain in the arm in which the PICC is placed. You have a red streak going up your arm that   starts under the PICC dressing. These symptoms may be an emergency. Get help right away. Call 911. Do not wait to see if the symptoms will go away. Do not drive yourself to the hospital. Summary A peripherally inserted central catheter (PICC) is a long, thin, flexible tube (catheter) that is put into a vein in the arm or leg. If cared for properly, a PICC can remain in place for many months. Having a PICC can allow you to go home from the hospital sooner and continue treatment at home. The PICC is inserted using a germ-free (sterile) technique by a specially trained health care provider. Only a trained health care provider should remove it. Do not have your blood pressure checked on the arm in which your PICC is placed. Always keep your PICC identification card with you. This information is not intended to replace advice given to you by your health care provider. Make sure you discuss any questions you have with your health care provider. Document Revised: 10/27/2020 Document Reviewed: 10/27/2020 Elsevier Patient Education  2023 Else[employer] Inc.  

## 2021-10-26 ENCOUNTER — Inpatient Hospital Stay: Payer: PRIVATE HEALTH INSURANCE

## 2021-10-26 VITALS — BP 127/98 | HR 81 | Temp 98.3°F | Resp 18

## 2021-10-26 DIAGNOSIS — Z5111 Encounter for antineoplastic chemotherapy: Secondary | ICD-10-CM | POA: Diagnosis not present

## 2021-10-26 DIAGNOSIS — C180 Malignant neoplasm of cecum: Secondary | ICD-10-CM

## 2021-10-26 MED ORDER — SODIUM CHLORIDE 0.9% FLUSH
3.0000 mL | Freq: Once | INTRAVENOUS | Status: AC | PRN
  Administered 2021-10-26: 10 mL

## 2021-10-26 MED ORDER — HEPARIN SOD (PORK) LOCK FLUSH 100 UNIT/ML IV SOLN
250.0000 [IU] | Freq: Once | INTRAVENOUS | Status: AC | PRN
  Administered 2021-10-26: 250 [IU]

## 2021-10-26 MED ORDER — SODIUM CHLORIDE 0.9% FLUSH
10.0000 mL | Freq: Once | INTRAVENOUS | Status: AC
  Administered 2021-10-26: 10 mL

## 2021-10-26 MED ORDER — HEPARIN SOD (PORK) LOCK FLUSH 100 UNIT/ML IV SOLN
500.0000 [IU] | Freq: Once | INTRAVENOUS | Status: AC
  Administered 2021-10-26: 500 [IU]

## 2021-10-26 NOTE — Patient Instructions (Signed)

## 2021-10-28 ENCOUNTER — Inpatient Hospital Stay: Payer: PRIVATE HEALTH INSURANCE

## 2021-10-28 VITALS — BP 111/80 | HR 85 | Temp 98.2°F | Resp 17

## 2021-10-28 DIAGNOSIS — Z452 Encounter for adjustment and management of vascular access device: Secondary | ICD-10-CM

## 2021-10-28 DIAGNOSIS — Z5111 Encounter for antineoplastic chemotherapy: Secondary | ICD-10-CM | POA: Diagnosis not present

## 2021-10-28 MED ORDER — SODIUM CHLORIDE 0.9% FLUSH
10.0000 mL | INTRAVENOUS | Status: AC | PRN
  Administered 2021-10-28: 10 mL

## 2021-10-28 MED ORDER — HEPARIN SOD (PORK) LOCK FLUSH 100 UNIT/ML IV SOLN
250.0000 [IU] | INTRAVENOUS | Status: AC | PRN
  Administered 2021-10-28: 250 [IU]

## 2021-10-28 NOTE — Patient Instructions (Signed)
PICC Home Care Guide A peripherally inserted central catheter (PICC) is a form of IV access that allows medicines and IV fluids to be quickly put into the blood and spread throughout the body. The PICC is a long, thin, flexible tube (catheter) that is put into a vein in a person's arm or leg. The catheter ends in a large vein just outside the heart called the superior vena cava (SVC). After the PICC is put in, a chest X-ray may be done to make sure that it is in the right place. A PICC may be placed for different reasons, such as: To give medicines and liquid nutrition. To give IV fluids and blood products. To take blood samples often. If there is trouble placing a peripheral intravenous (PIV) catheter. If cared for properly, a PICC can remain in place for many months. Having a PICC can allow you to go home from the hospital sooner and continue treatment at home. Medicines and PICC care can be managed at home by a family member, caregiver, or home health care team. What are the risks? Generally, having a PICC is safe. However, problems may occur, including: A blood clot (thrombus) forming in or at the end of the PICC. A blood clot forming in a vein (deep vein thrombosis) or traveling to the lung (pulmonary embolism). Inflammation of the vein (phlebitis) in which the PICC is placed. Infection at the insertion site or in the blood. Blood infections from central lines, like PICCs, can be serious and often require a hospital stay. PICC malposition, or PICC movement or poor placement. A break or cut in the PICC. Do not use scissors near the PICC. Nerve or tendon irritation or injury during PICC insertion. How to care for your PICC Please follow the specific guidelines provided by your health care provider. Preventing infection You and any caregivers should wash your hands often with soap and water for at least 20 seconds. Wash hands: Before touching the PICC or the infusion device. Before changing a  bandage (dressing). Do not change the dressing unless you have been taught to do so and have shown you are able to change it safely. Flush the PICC as told. Tell your health care provider right away if the PICC is hard to flush or does not flush. Do not use force to flush the PICC. Use clean and germ-free (sterile) supplies only. Keep the supplies in a dry place. Do not reuse needles, syringes, or any other supplies. Reusing supplies can lead to infection. Keep the PICC dressing dry and secure it with tape if the edges stop sticking to your skin. Check your PICC insertion site every day for signs of infection. Check for: Redness, swelling, or pain. Fluid or blood. Warmth. Pus or a bad smell. Preventing other problems Do not use a syringe that is less than 10 mL to flush the PICC. Do not have your blood pressure checked on the arm in which the PICC is placed. Do not ever pull or tug on the PICC. Keep it secured to your arm with tape or a stretch wrap when not in use. Do not take the PICC out yourself. Only a trained health care provider should remove the PICC. Keep pets and children away from your PICC. How to care for your PICC dressing Keep your PICC dressing clean and dry to prevent infection. Do not take baths, swim, or use a hot tub until your health care provider approves. Ask your health care provider if you can take   showers. You may only be allowed to take sponge baths. When you are allowed to shower: Ask your health care provider to teach you how to wrap the PICC. Cover the PICC with clear plastic wrap and tape to keep it dry while showering. Follow instructions from your health care provider about how to take care of your insertion site and dressing. Make sure you: Wash your hands with soap and water for at least 20 seconds before and after you change your dressing. If soap and water are not available, use hand sanitizer. Change your dressing only if taught to do so by your health care  provider. Your PICC dressing needs to be changed if it becomes loose or wet. Leave stitches (sutures), skin glue, or adhesive strips in place. These skin closures may need to stay in place for 2 weeks or longer. If adhesive strip edges start to loosen and curl up, you may trim the loose edges. Do not remove adhesive strips completely unless your health care provider tells you to do that. Follow these instructions at home: Disposal of supplies Throw away any syringes in a disposal container that is meant for sharp items (sharps container). You can buy a sharps container from a pharmacy, or you can make one by using an empty, hard plastic bottle with a lid. Place any used dressings or infusion bags into a plastic bag. Throw that bag in the trash. General instructions  Always carry your PICC identification card or wear a medical alert bracelet. Keep the tube clamped at all times, unless it is being used. Always carry a smooth-edge clamp with you to clamp the PICC if it breaks. Do not use scissors or sharp objects near the tube. You may bend your arm and move it freely. If your PICC is near or at the bend of your elbow, avoid activity with repeated motion at the elbow. Avoid lifting heavy objects as told by your health care provider. Keep all follow-up visits. This is important. You will need to have your PICC dressing changed at least once a week. Contact a health care provider if: You have pain in your arm, ear, face, or teeth. You have a fever or chills. You have redness, swelling, or pain around the insertion site. You have fluid or blood coming from the insertion site. Your insertion site feels warm to the touch. You have pus or a bad smell coming from the insertion site. Your skin feels hard and raised around the insertion site. Your PICC dressing has gotten wet or is coming off and you have not been taught how to change it. Get help right away if: You have problems with your PICC, such as  your PICC: Was tugged or pulled and has partially come out. Do not  push the PICC back in. Cannot be flushed, is hard to flush, or leaks around the insertion site when it is flushed. Makes a flushing sound when it is flushed. Appears to have a hole or tear. Is accidentally pulled all the way out. If this happens, cover the insertion site with a gauze dressing. Do not throw the PICC away. Your health care provider will need to check it to be sure the entire catheter came out. You feel your heart racing or skipping beats, or you have chest pain. You have shortness of breath or trouble breathing. You have swelling, redness, warmth, or pain in the arm in which the PICC is placed. You have a red streak going up your arm that   starts under the PICC dressing. These symptoms may be an emergency. Get help right away. Call 911. Do not wait to see if the symptoms will go away. Do not drive yourself to the hospital. Summary A peripherally inserted central catheter (PICC) is a long, thin, flexible tube (catheter) that is put into a vein in the arm or leg. If cared for properly, a PICC can remain in place for many months. Having a PICC can allow you to go home from the hospital sooner and continue treatment at home. The PICC is inserted using a germ-free (sterile) technique by a specially trained health care provider. Only a trained health care provider should remove it. Do not have your blood pressure checked on the arm in which your PICC is placed. Always keep your PICC identification card with you. This information is not intended to replace advice given to you by your health care provider. Make sure you discuss any questions you have with your health care provider. Document Revised: 10/27/2020 Document Reviewed: 10/27/2020 Elsevier Patient Education  2023 Else[employer] Inc.  

## 2021-10-28 NOTE — Progress Notes (Signed)
Patient in today for PICC line flush and dressing change. Unable to obtain blood return from either port, line seems to have migrated out farther. Instructed patient to call IR and have Picc line evaluated. Patient verbalized understanding.

## 2021-10-31 ENCOUNTER — Other Ambulatory Visit: Payer: Self-pay | Admitting: Hematology & Oncology

## 2021-10-31 ENCOUNTER — Inpatient Hospital Stay: Payer: PRIVATE HEALTH INSURANCE

## 2021-10-31 VITALS — BP 115/75 | HR 80 | Temp 98.2°F | Resp 18

## 2021-10-31 DIAGNOSIS — C180 Malignant neoplasm of cecum: Secondary | ICD-10-CM

## 2021-10-31 DIAGNOSIS — Z5111 Encounter for antineoplastic chemotherapy: Secondary | ICD-10-CM | POA: Diagnosis not present

## 2021-10-31 MED ORDER — HEPARIN SOD (PORK) LOCK FLUSH 100 UNIT/ML IV SOLN
500.0000 [IU] | Freq: Once | INTRAVENOUS | Status: AC | PRN
  Administered 2021-10-31: 250 [IU]

## 2021-10-31 MED ORDER — SODIUM CHLORIDE 0.9% FLUSH
10.0000 mL | Freq: Once | INTRAVENOUS | Status: AC | PRN
  Administered 2021-10-31: 10 mL

## 2021-10-31 MED ORDER — SODIUM CHLORIDE 0.9% FLUSH
10.0000 mL | Freq: Once | INTRAVENOUS | Status: AC
  Administered 2021-10-31: 10 mL via INTRAVENOUS

## 2021-10-31 MED ORDER — HEPARIN SOD (PORK) LOCK FLUSH 100 UNIT/ML IV SOLN
250.0000 [IU] | Freq: Once | INTRAVENOUS | Status: AC
  Administered 2021-10-31: 250 [IU] via INTRAVENOUS

## 2021-10-31 NOTE — Patient Instructions (Signed)

## 2021-11-02 ENCOUNTER — Inpatient Hospital Stay: Payer: PRIVATE HEALTH INSURANCE

## 2021-11-02 ENCOUNTER — Inpatient Hospital Stay (HOSPITAL_BASED_OUTPATIENT_CLINIC_OR_DEPARTMENT_OTHER): Payer: PRIVATE HEALTH INSURANCE | Admitting: Family

## 2021-11-02 ENCOUNTER — Encounter: Payer: Self-pay | Admitting: Family

## 2021-11-02 VITALS — BP 114/83 | HR 69 | Temp 98.2°F | Resp 19 | Wt 253.0 lb

## 2021-11-02 DIAGNOSIS — C180 Malignant neoplasm of cecum: Secondary | ICD-10-CM

## 2021-11-02 DIAGNOSIS — C787 Secondary malignant neoplasm of liver and intrahepatic bile duct: Secondary | ICD-10-CM

## 2021-11-02 DIAGNOSIS — D509 Iron deficiency anemia, unspecified: Secondary | ICD-10-CM | POA: Diagnosis not present

## 2021-11-02 DIAGNOSIS — Z452 Encounter for adjustment and management of vascular access device: Secondary | ICD-10-CM

## 2021-11-02 DIAGNOSIS — Z5111 Encounter for antineoplastic chemotherapy: Secondary | ICD-10-CM | POA: Diagnosis not present

## 2021-11-02 LAB — CBC WITH DIFFERENTIAL (CANCER CENTER ONLY)
Abs Immature Granulocytes: 0.02 10*3/uL (ref 0.00–0.07)
Basophils Absolute: 0.1 10*3/uL (ref 0.0–0.1)
Basophils Relative: 1 %
Eosinophils Absolute: 0.1 10*3/uL (ref 0.0–0.5)
Eosinophils Relative: 2 %
HCT: 37.2 % (ref 36.0–46.0)
Hemoglobin: 11.8 g/dL — ABNORMAL LOW (ref 12.0–15.0)
Immature Granulocytes: 0 %
Lymphocytes Relative: 32 %
Lymphs Abs: 1.9 10*3/uL (ref 0.7–4.0)
MCH: 30.3 pg (ref 26.0–34.0)
MCHC: 31.7 g/dL (ref 30.0–36.0)
MCV: 95.4 fL (ref 80.0–100.0)
Monocytes Absolute: 0.7 10*3/uL (ref 0.1–1.0)
Monocytes Relative: 12 %
Neutro Abs: 3.2 10*3/uL (ref 1.7–7.7)
Neutrophils Relative %: 53 %
Platelet Count: 251 10*3/uL (ref 150–400)
RBC: 3.9 MIL/uL (ref 3.87–5.11)
RDW: 19.4 % — ABNORMAL HIGH (ref 11.5–15.5)
Smear Review: NORMAL
WBC Count: 6 10*3/uL (ref 4.0–10.5)
nRBC: 0 % (ref 0.0–0.2)

## 2021-11-02 LAB — CMP (CANCER CENTER ONLY)
ALT: 18 U/L (ref 0–44)
AST: 17 U/L (ref 15–41)
Albumin: 3.8 g/dL (ref 3.5–5.0)
Alkaline Phosphatase: 72 U/L (ref 38–126)
Anion gap: 8 (ref 5–15)
BUN: 10 mg/dL (ref 6–20)
CO2: 28 mmol/L (ref 22–32)
Calcium: 9 mg/dL (ref 8.9–10.3)
Chloride: 107 mmol/L (ref 98–111)
Creatinine: 0.77 mg/dL (ref 0.44–1.00)
GFR, Estimated: >60 mL/min (ref >60)
Glucose, Bld: 105 mg/dL — ABNORMAL HIGH (ref 70–99)
Potassium: 3.6 mmol/L (ref 3.5–5.1)
Sodium: 143 mmol/L (ref 135–145)
Total Bilirubin: 0.4 mg/dL (ref 0.3–1.2)
Total Protein: 5.8 g/dL — ABNORMAL LOW (ref 6.5–8.1)

## 2021-11-02 LAB — RETICULOCYTES
Immature Retic Fract: 17.7 % — ABNORMAL HIGH (ref 2.3–15.9)
RBC.: 3.84 MIL/uL — ABNORMAL LOW (ref 3.87–5.11)
Retic Count, Absolute: 106.8 10*3/uL (ref 19.0–186.0)
Retic Ct Pct: 2.8 % (ref 0.4–3.1)

## 2021-11-02 LAB — IRON AND IRON BINDING CAPACITY (CC-WL,HP ONLY)
Iron: 58 ug/dL (ref 28–170)
Saturation Ratios: 25 % (ref 10.4–31.8)
TIBC: 230 ug/dL — ABNORMAL LOW (ref 250–450)
UIBC: 172 ug/dL (ref 148–442)

## 2021-11-02 LAB — LACTATE DEHYDROGENASE: LDH: 150 U/L (ref 98–192)

## 2021-11-02 LAB — FERRITIN: Ferritin: 510 ng/mL — ABNORMAL HIGH (ref 11–307)

## 2021-11-02 LAB — CEA (IN HOUSE-CHCC): CEA (CHCC-In House): 3.16 ng/mL (ref 0.00–5.00)

## 2021-11-02 MED ORDER — SODIUM CHLORIDE 0.9 % IV SOLN
400.0000 mg/m2 | Freq: Once | INTRAVENOUS | Status: AC
  Administered 2021-11-02: 956 mg via INTRAVENOUS
  Filled 2021-11-02: qty 47.8

## 2021-11-02 MED ORDER — SODIUM CHLORIDE 0.9 % IV SOLN
150.0000 mg | Freq: Once | INTRAVENOUS | Status: AC
  Administered 2021-11-02: 150 mg via INTRAVENOUS
  Filled 2021-11-02: qty 150

## 2021-11-02 MED ORDER — SODIUM CHLORIDE 0.9 % IV SOLN: Freq: Once | INTRAVENOUS | Status: AC

## 2021-11-02 MED ORDER — SODIUM CHLORIDE 0.9 % IV SOLN
10.0000 mg | Freq: Once | INTRAVENOUS | Status: AC
  Administered 2021-11-02: 10 mg via INTRAVENOUS
  Filled 2021-11-02: qty 10

## 2021-11-02 MED ORDER — PALONOSETRON HCL INJECTION 0.25 MG/5ML
0.2500 mg | Freq: Once | INTRAVENOUS | Status: AC
  Administered 2021-11-02: 0.25 mg via INTRAVENOUS
  Filled 2021-11-02: qty 5

## 2021-11-02 MED ORDER — SODIUM CHLORIDE 0.9 % IV SOLN
165.0000 mg/m2 | Freq: Once | INTRAVENOUS | Status: AC
  Administered 2021-11-02: 400 mg via INTRAVENOUS
  Filled 2021-11-02: qty 15

## 2021-11-02 MED ORDER — SODIUM CHLORIDE 0.9 % IV SOLN
2400.0000 mg/m2 | INTRAVENOUS | Status: DC
  Administered 2021-11-02: 5750 mg via INTRAVENOUS
  Filled 2021-11-02: qty 115

## 2021-11-02 MED ORDER — ATROPINE SULFATE 1 MG/ML IV SOLN
0.5000 mg | Freq: Once | INTRAVENOUS | Status: AC | PRN
  Administered 2021-11-02: 0.5 mg via INTRAVENOUS
  Filled 2021-11-02: qty 1

## 2021-11-02 NOTE — Patient Instructions (Signed)
[facility]  Discharge Instructions: Thank you for choosing [facility] to provide your oncology and hematology care.   If you have a lab appointment with the [facility], please go directly to the [facility] and check in at the registration area.  Wear comfortable clothing and clothing appropriate for easy access to any Portacath or PICC line.   We strive to give you quality time with your provider. You may need to reschedule your appointment if you arrive late (15 or more minutes).  Arriving late affects you and other patients whose appointments are after yours.  Also, if you miss three or more appointments without notifying the office, you may be dismissed from the clinic at the provider's discretion.      For prescription refill requests, have your pharmacy contact our office and allow 72 hours for refills to be completed.    Today you received the following chemotherapy and/or immunotherapy agents:  Irinotecan, Leucovorin and 5FU.      To help prevent nausea and vomiting after your treatment, we encourage you to take your nausea medication as directed.  BELOW ARE SYMPTOMS THAT SHOULD BE REPORTED IMMEDIATELY: *FEVER GREATER THAN 100.4 F (38 C) OR HIGHER *CHILLS OR SWEATING *NAUSEA AND VOMITING THAT IS NOT CONTROLLED WITH YOUR NAUSEA MEDICATION *UNUSUAL SHORTNESS OF BREATH *UNUSUAL BRUISING OR BLEEDING *URINARY PROBLEMS (pain or burning when urinating, or frequent urination) *BOWEL PROBLEMS (unusual diarrhea, constipation, pain near the anus) TENDERNESS IN MOUTH AND THROAT WITH OR WITHOUT PRESENCE OF ULCERS (sore throat, sores in mouth, or a toothache) UNUSUAL RASH, SWELLING OR PAIN  UNUSUAL VAGINAL DISCHARGE OR ITCHING   Items with * indicate a potential emergency and should be followed up as soon as possible or go to the Emergency Department if any problems should occur.  Please show the CHEMOTHERAPY ALERT CARD or IMMUNOTHERAPY ALERT  CARD at check-in to the Emergency Department and triage nurse. Should you have questions after your visit or need to cancel or reschedule your appointment, please contact [facility]  [phone] and follow the prompts.  Office hours are 8:00 a.m. to 4:30 p.m. Monday - Friday. Please note that voicemails left after 4:00 p.m. may not be returned until the following business day.  We are closed weekends and major holidays. You have access to a nurse at all times for urgent questions. Please call the main number to the clinic [phone] and follow the prompts.  For any non-urgent questions, you may also contact your provider using MyChart. We now offer e-Visits for anyone 32 and older to request care online for non-urgent symptoms. For details visit [email or web address].   Also download the MyChart app! Go to the app store, search "MyChart", open the app, select [facility], and log in with your MyChart username and password.  Masks are optional in the cancer centers. If you would like for your care team to wear a mask while they are taking care of you, please let them know. For doctor visits, patients may have with them one support person who is at least 56 years old. At this time, visitors are not allowed in the infusion area.

## 2021-11-02 NOTE — Patient Instructions (Signed)
PICC Home Care Guide A peripherally inserted central catheter (PICC) is a form of IV access that allows medicines and IV fluids to be quickly put into the blood and spread throughout the body. The PICC is a long, thin, flexible tube (catheter) that is put into a vein in a person's arm or leg. The catheter ends in a large vein just outside the heart called the superior vena cava (SVC). After the PICC is put in, a chest X-ray may be done to make sure that it is in the right place. A PICC may be placed for different reasons, such as: To give medicines and liquid nutrition. To give IV fluids and blood products. To take blood samples often. If there is trouble placing a peripheral intravenous (PIV) catheter. If cared for properly, a PICC can remain in place for many months. Having a PICC can allow you to go home from the hospital sooner and continue treatment at home. Medicines and PICC care can be managed at home by a family member, caregiver, or home health care team. What are the risks? Generally, having a PICC is safe. However, problems may occur, including: A blood clot (thrombus) forming in or at the end of the PICC. A blood clot forming in a vein (deep vein thrombosis) or traveling to the lung (pulmonary embolism). Inflammation of the vein (phlebitis) in which the PICC is placed. Infection at the insertion site or in the blood. Blood infections from central lines, like PICCs, can be serious and often require a hospital stay. PICC malposition, or PICC movement or poor placement. A break or cut in the PICC. Do not use scissors near the PICC. Nerve or tendon irritation or injury during PICC insertion. How to care for your PICC Please follow the specific guidelines provided by your health care provider. Preventing infection You and any caregivers should wash your hands often with soap and water for at least 20 seconds. Wash hands: Before touching the PICC or the infusion device. Before changing a  bandage (dressing). Do not change the dressing unless you have been taught to do so and have shown you are able to change it safely. Flush the PICC as told. Tell your health care provider right away if the PICC is hard to flush or does not flush. Do not use force to flush the PICC. Use clean and germ-free (sterile) supplies only. Keep the supplies in a dry place. Do not reuse needles, syringes, or any other supplies. Reusing supplies can lead to infection. Keep the PICC dressing dry and secure it with tape if the edges stop sticking to your skin. Check your PICC insertion site every day for signs of infection. Check for: Redness, swelling, or pain. Fluid or blood. Warmth. Pus or a bad smell. Preventing other problems Do not use a syringe that is less than 10 mL to flush the PICC. Do not have your blood pressure checked on the arm in which the PICC is placed. Do not ever pull or tug on the PICC. Keep it secured to your arm with tape or a stretch wrap when not in use. Do not take the PICC out yourself. Only a trained health care provider should remove the PICC. Keep pets and children away from your PICC. How to care for your PICC dressing Keep your PICC dressing clean and dry to prevent infection. Do not take baths, swim, or use a hot tub until your health care provider approves. Ask your health care provider if you can take   showers. You may only be allowed to take sponge baths. When you are allowed to shower: Ask your health care provider to teach you how to wrap the PICC. Cover the PICC with clear plastic wrap and tape to keep it dry while showering. Follow instructions from your health care provider about how to take care of your insertion site and dressing. Make sure you: Wash your hands with soap and water for at least 20 seconds before and after you change your dressing. If soap and water are not available, use hand sanitizer. Change your dressing only if taught to do so by your health care  provider. Your PICC dressing needs to be changed if it becomes loose or wet. Leave stitches (sutures), skin glue, or adhesive strips in place. These skin closures may need to stay in place for 2 weeks or longer. If adhesive strip edges start to loosen and curl up, you may trim the loose edges. Do not remove adhesive strips completely unless your health care provider tells you to do that. Follow these instructions at home: Disposal of supplies Throw away any syringes in a disposal container that is meant for sharp items (sharps container). You can buy a sharps container from a pharmacy, or you can make one by using an empty, hard plastic bottle with a lid. Place any used dressings or infusion bags into a plastic bag. Throw that bag in the trash. General instructions  Always carry your PICC identification card or wear a medical alert bracelet. Keep the tube clamped at all times, unless it is being used. Always carry a smooth-edge clamp with you to clamp the PICC if it breaks. Do not use scissors or sharp objects near the tube. You may bend your arm and move it freely. If your PICC is near or at the bend of your elbow, avoid activity with repeated motion at the elbow. Avoid lifting heavy objects as told by your health care provider. Keep all follow-up visits. This is important. You will need to have your PICC dressing changed at least once a week. Contact a health care provider if: You have pain in your arm, ear, face, or teeth. You have a fever or chills. You have redness, swelling, or pain around the insertion site. You have fluid or blood coming from the insertion site. Your insertion site feels warm to the touch. You have pus or a bad smell coming from the insertion site. Your skin feels hard and raised around the insertion site. Your PICC dressing has gotten wet or is coming off and you have not been taught how to change it. Get help right away if: You have problems with your PICC, such as  your PICC: Was tugged or pulled and has partially come out. Do not  push the PICC back in. Cannot be flushed, is hard to flush, or leaks around the insertion site when it is flushed. Makes a flushing sound when it is flushed. Appears to have a hole or tear. Is accidentally pulled all the way out. If this happens, cover the insertion site with a gauze dressing. Do not throw the PICC away. Your health care provider will need to check it to be sure the entire catheter came out. You feel your heart racing or skipping beats, or you have chest pain. You have shortness of breath or trouble breathing. You have swelling, redness, warmth, or pain in the arm in which the PICC is placed. You have a red streak going up your arm that   starts under the PICC dressing. These symptoms may be an emergency. Get help right away. Call 911. Do not wait to see if the symptoms will go away. Do not drive yourself to the hospital. Summary A peripherally inserted central catheter (PICC) is a long, thin, flexible tube (catheter) that is put into a vein in the arm or leg. If cared for properly, a PICC can remain in place for many months. Having a PICC can allow you to go home from the hospital sooner and continue treatment at home. The PICC is inserted using a germ-free (sterile) technique by a specially trained health care provider. Only a trained health care provider should remove it. Do not have your blood pressure checked on the arm in which your PICC is placed. Always keep your PICC identification card with you. This information is not intended to replace advice given to you by your health care provider. Make sure you discuss any questions you have with your health care provider. Document Revised: 10/27/2020 Document Reviewed: 10/27/2020 Elsevier Patient Education  2023 Else[employer] Inc.  

## 2021-11-02 NOTE — Progress Notes (Signed)
Hematology and Oncology Follow Up Visit  Lauren Mcpherson [id number] [birth year] 56 y.o. 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 She is doing well and has no complaints at this time.  No fever, chills, n/v, cough, rash, dizziness, SOB, chest pain, palpitations, abdominal pain or changes in bowel or bladder habits.  CEA last visit was 3.39.  No swelling, tenderness in her extremities.  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  No falls or syncope. Appetite and hydration good. Weight is stable at 253 lbs.   ECOG Performance Status: 1 - Symptomatic but completely ambulatory  Medications:  Allergies as of 11/02/2021   No Known Allergies      Medication List        Accurate as of November 02, 2021  9:22 AM. If you have any questions, ask your nurse or doctor.          dexamethasone 6 MG tablet Commonly known as: DECADRON Take 1 tablet (6 mg total) by mouth See admin instructions. Take 6 mg by mouth daily for 3 days, starting the day after chemotherapy. Take with food.   loperamide 2 MG tablet Commonly known as: Imodium A-D Take 2 at onset of diarrhea, then 1 every 2hrs until 12hr without a BM. May take 2 tab every 4hrs at bedtime. If diarrhea recurs repeat.   ondansetron 8 MG tablet Commonly known as: Zofran Take 1 tablet (8 mg total) by mouth 2 (two) times daily as needed. Start on day 3 after chemotherapy.   prochlorperazine 10 MG tablet Commonly known as: COMPAZINE Take 1 tablet (10 mg total) by mouth every 6 (six) hours as needed  (Nausea or vomiting).   rivaroxaban 10 MG Tabs tablet Commonly known as: XARELTO Take 1 tablet (10 mg total) by mouth daily with supper.        Allergies: No Known Allergies  Past Medical History, Surgical history, Social history, and Family History were reviewed and updated.  Review of Systems: All other 10 point review of systems is negative.   Physical Exam:  weight is 253 lb (114.8 kg). Her oral temperature is 98.2 F (36.8 C). Her blood pressure is 114/83 and her pulse is 69. Her respiration is 19 and oxygen saturation is 98%.   Wt Readings from Last 3 Encounters:  11/02/21 253 lb (114.8 kg)  10/19/21 256 lb 12.8 oz (116.5 kg)  10/05/21 253 lb 1.3 oz (114.8 kg)    Ocular: Sclerae unicteric, pupils equal, round and reactive to light Ear-nose-throat: Oropharynx clear, dentition fair Lymphatic: No cervical or supraclavicular adenopathy Lungs no rales or rhonchi, good excursion bilaterally Heart regular rate and rhythm, no murmur appreciated Abd soft, nontender, positive bowel sounds MSK no focal spinal tenderness, no joint edema Neuro: non-focal, well-oriented, appropriate affect Breasts: Deferred   Lab Results  Component Value Date   WBC 6.0 11/02/2021   HGB 11.8 (L) 11/02/2021   HCT 37.2 11/02/2021   MCV 95.4 11/02/2021   PLT 251 11/02/2021   Lab Results  Component Value Date   FERRITIN  507 (H) 10/19/2021   IRON 75 10/19/2021   TIBC 217 (L) 10/19/2021   UIBC 142 (L) 10/19/2021   IRONPCTSAT 35 (H) 10/19/2021   Lab Results  Component Value Date   RETICCTPCT 2.8 11/02/2021   RBC 3.90 11/02/2021   RBC 3.84 (L) 11/02/2021   No results found for: "KPAFRELGTCHN", "LAMBDASER", "KAPLAMBRATIO" No results found for: "IGGSERUM", "IGA", "IGMSERUM" No results found for: "TOTALPROTELP", "ALBUMINELP", "A1GS", "A2GS", "BETS", "BETA2SER", "GAMS", "MSPIKE", "SPEI"   Chemistry      Component Value Date/Time   NA 143 11/02/2021 0820   K 3.6 11/02/2021 0820   CL 107  11/02/2021 0820   CO2 28 11/02/2021 0820   BUN 10 11/02/2021 0820   CREATININE 0.77 11/02/2021 0820      Component Value Date/Time   CALCIUM 9.0 11/02/2021 0820   ALKPHOS 72 11/02/2021 0820   AST 17 11/02/2021 0820   ALT 18 11/02/2021 0820   BILITOT 0.4 11/02/2021 0820       Impression and Plan: Lauren Mcpherson is a very pleasant 56 yo African American female with metastatic 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   We will proceed with treatment.  CEA and iron studies are pending.  PICC flush 3 times a week and follow-up in 2 weeks.   Lottie Dawson, NP 7/12/20239:22 AM

## 2021-11-04 ENCOUNTER — Other Ambulatory Visit: Payer: Self-pay | Admitting: *Deleted

## 2021-11-04 ENCOUNTER — Inpatient Hospital Stay: Payer: PRIVATE HEALTH INSURANCE

## 2021-11-04 VITALS — BP 107/67 | HR 76 | Temp 97.9°F | Resp 18

## 2021-11-04 DIAGNOSIS — C180 Malignant neoplasm of cecum: Secondary | ICD-10-CM

## 2021-11-04 DIAGNOSIS — Z5111 Encounter for antineoplastic chemotherapy: Secondary | ICD-10-CM | POA: Diagnosis not present

## 2021-11-04 MED ORDER — PEGFILGRASTIM-BMEZ 6 MG/0.6ML ~~LOC~~ SOSY
6.0000 mg | PREFILLED_SYRINGE | Freq: Once | SUBCUTANEOUS | Status: AC
  Administered 2021-11-04: 6 mg via SUBCUTANEOUS
  Filled 2021-11-04: qty 0.6

## 2021-11-04 MED ORDER — HEPARIN SOD (PORK) LOCK FLUSH 100 UNIT/ML IV SOLN
250.0000 [IU] | Freq: Once | INTRAVENOUS | Status: AC | PRN
  Administered 2021-11-04: 250 [IU]

## 2021-11-04 MED ORDER — SODIUM CHLORIDE 0.9% FLUSH
10.0000 mL | INTRAVENOUS | Status: DC | PRN
  Administered 2021-11-04 (×2): 10 mL

## 2021-11-04 MED ORDER — SODIUM CHLORIDE 0.9% FLUSH
3.0000 mL | INTRAVENOUS | Status: DC | PRN
  Administered 2021-11-04: 10 mL

## 2021-11-04 MED ORDER — HEPARIN SOD (PORK) LOCK FLUSH 100 UNIT/ML IV SOLN
500.0000 [IU] | Freq: Once | INTRAVENOUS | Status: AC | PRN
  Administered 2021-11-04: 500 [IU]

## 2021-11-04 MED ORDER — DEXAMETHASONE 6 MG PO TABS: 6.0000 mg | ORAL_TABLET | ORAL | 2 refills | Status: DC

## 2021-11-04 NOTE — Patient Instructions (Signed)

## 2021-11-07 ENCOUNTER — Inpatient Hospital Stay: Payer: PRIVATE HEALTH INSURANCE

## 2021-11-07 VITALS — BP 126/67 | HR 69 | Temp 98.2°F | Resp 17

## 2021-11-07 DIAGNOSIS — C180 Malignant neoplasm of cecum: Secondary | ICD-10-CM

## 2021-11-07 DIAGNOSIS — Z5111 Encounter for antineoplastic chemotherapy: Secondary | ICD-10-CM | POA: Diagnosis not present

## 2021-11-07 MED ORDER — HEPARIN SOD (PORK) LOCK FLUSH 100 UNIT/ML IV SOLN
500.0000 [IU] | Freq: Once | INTRAVENOUS | Status: AC
  Administered 2021-11-07: 500 [IU]

## 2021-11-07 MED ORDER — SODIUM CHLORIDE 0.9% FLUSH
10.0000 mL | Freq: Once | INTRAVENOUS | Status: AC
  Administered 2021-11-07: 10 mL

## 2021-11-07 NOTE — Patient Instructions (Signed)
PICC Home Care Guide A peripherally inserted central catheter (PICC) is a form of IV access that allows medicines and IV fluids to be quickly put into the blood and spread throughout the body. The PICC is a long, thin, flexible tube (catheter) that is put into a vein in a person's arm or leg. The catheter ends in a large vein just outside the heart called the superior vena cava (SVC). After the PICC is put in, a chest X-ray may be done to make sure that it is in the right place. A PICC may be placed for different reasons, such as: To give medicines and liquid nutrition. To give IV fluids and blood products. To take blood samples often. If there is trouble placing a peripheral intravenous (PIV) catheter. If cared for properly, a PICC can remain in place for many months. Having a PICC can allow you to go home from the hospital sooner and continue treatment at home. Medicines and PICC care can be managed at home by a family member, caregiver, or home health care team. What are the risks? Generally, having a PICC is safe. However, problems may occur, including: A blood clot (thrombus) forming in or at the end of the PICC. A blood clot forming in a vein (deep vein thrombosis) or traveling to the lung (pulmonary embolism). Inflammation of the vein (phlebitis) in which the PICC is placed. Infection at the insertion site or in the blood. Blood infections from central lines, like PICCs, can be serious and often require a hospital stay. PICC malposition, or PICC movement or poor placement. A break or cut in the PICC. Do not use scissors near the PICC. Nerve or tendon irritation or injury during PICC insertion. How to care for your PICC Please follow the specific guidelines provided by your health care provider. Preventing infection You and any caregivers should wash your hands often with soap and water for at least 20 seconds. Wash hands: Before touching the PICC or the infusion device. Before changing a  bandage (dressing). Do not change the dressing unless you have been taught to do so and have shown you are able to change it safely. Flush the PICC as told. Tell your health care provider right away if the PICC is hard to flush or does not flush. Do not use force to flush the PICC. Use clean and germ-free (sterile) supplies only. Keep the supplies in a dry place. Do not reuse needles, syringes, or any other supplies. Reusing supplies can lead to infection. Keep the PICC dressing dry and secure it with tape if the edges stop sticking to your skin. Check your PICC insertion site every day for signs of infection. Check for: Redness, swelling, or pain. Fluid or blood. Warmth. Pus or a bad smell. Preventing other problems Do not use a syringe that is less than 10 mL to flush the PICC. Do not have your blood pressure checked on the arm in which the PICC is placed. Do not ever pull or tug on the PICC. Keep it secured to your arm with tape or a stretch wrap when not in use. Do not take the PICC out yourself. Only a trained health care provider should remove the PICC. Keep pets and children away from your PICC. How to care for your PICC dressing Keep your PICC dressing clean and dry to prevent infection. Do not take baths, swim, or use a hot tub until your health care provider approves. Ask your health care provider if you can take   showers. You may only be allowed to take sponge baths. When you are allowed to shower: Ask your health care provider to teach you how to wrap the PICC. Cover the PICC with clear plastic wrap and tape to keep it dry while showering. Follow instructions from your health care provider about how to take care of your insertion site and dressing. Make sure you: Wash your hands with soap and water for at least 20 seconds before and after you change your dressing. If soap and water are not available, use hand sanitizer. Change your dressing only if taught to do so by your health care  provider. Your PICC dressing needs to be changed if it becomes loose or wet. Leave stitches (sutures), skin glue, or adhesive strips in place. These skin closures may need to stay in place for 2 weeks or longer. If adhesive strip edges start to loosen and curl up, you may trim the loose edges. Do not remove adhesive strips completely unless your health care provider tells you to do that. Follow these instructions at home: Disposal of supplies Throw away any syringes in a disposal container that is meant for sharp items (sharps container). You can buy a sharps container from a pharmacy, or you can make one by using an empty, hard plastic bottle with a lid. Place any used dressings or infusion bags into a plastic bag. Throw that bag in the trash. General instructions  Always carry your PICC identification card or wear a medical alert bracelet. Keep the tube clamped at all times, unless it is being used. Always carry a smooth-edge clamp with you to clamp the PICC if it breaks. Do not use scissors or sharp objects near the tube. You may bend your arm and move it freely. If your PICC is near or at the bend of your elbow, avoid activity with repeated motion at the elbow. Avoid lifting heavy objects as told by your health care provider. Keep all follow-up visits. This is important. You will need to have your PICC dressing changed at least once a week. Contact a health care provider if: You have pain in your arm, ear, face, or teeth. You have a fever or chills. You have redness, swelling, or pain around the insertion site. You have fluid or blood coming from the insertion site. Your insertion site feels warm to the touch. You have pus or a bad smell coming from the insertion site. Your skin feels hard and raised around the insertion site. Your PICC dressing has gotten wet or is coming off and you have not been taught how to change it. Get help right away if: You have problems with your PICC, such as  your PICC: Was tugged or pulled and has partially come out. Do not  push the PICC back in. Cannot be flushed, is hard to flush, or leaks around the insertion site when it is flushed. Makes a flushing sound when it is flushed. Appears to have a hole or tear. Is accidentally pulled all the way out. If this happens, cover the insertion site with a gauze dressing. Do not throw the PICC away. Your health care provider will need to check it to be sure the entire catheter came out. You feel your heart racing or skipping beats, or you have chest pain. You have shortness of breath or trouble breathing. You have swelling, redness, warmth, or pain in the arm in which the PICC is placed. You have a red streak going up your arm that   starts under the PICC dressing. These symptoms may be an emergency. Get help right away. Call 911. Do not wait to see if the symptoms will go away. Do not drive yourself to the hospital. Summary A peripherally inserted central catheter (PICC) is a long, thin, flexible tube (catheter) that is put into a vein in the arm or leg. If cared for properly, a PICC can remain in place for many months. Having a PICC can allow you to go home from the hospital sooner and continue treatment at home. The PICC is inserted using a germ-free (sterile) technique by a specially trained health care provider. Only a trained health care provider should remove it. Do not have your blood pressure checked on the arm in which your PICC is placed. Always keep your PICC identification card with you. This information is not intended to replace advice given to you by your health care provider. Make sure you discuss any questions you have with your health care provider. Document Revised: 10/27/2020 Document Reviewed: 10/27/2020 Elsevier Patient Education  2023 Else[employer] Inc.  

## 2021-11-08 NOTE — Progress Notes (Signed)
Ziextenzo changed to biosimilar product Udenyca per patient's insurance formulary.

## 2021-11-09 ENCOUNTER — Inpatient Hospital Stay: Payer: PRIVATE HEALTH INSURANCE

## 2021-11-09 VITALS — BP 118/84 | HR 94 | Temp 97.8°F | Resp 18

## 2021-11-09 DIAGNOSIS — Z5111 Encounter for antineoplastic chemotherapy: Secondary | ICD-10-CM | POA: Diagnosis not present

## 2021-11-09 DIAGNOSIS — C180 Malignant neoplasm of cecum: Secondary | ICD-10-CM

## 2021-11-09 MED ORDER — HEPARIN SOD (PORK) LOCK FLUSH 100 UNIT/ML IV SOLN
250.0000 [IU] | Freq: Once | INTRAVENOUS | Status: AC
  Administered 2021-11-09: 250 [IU]

## 2021-11-09 MED ORDER — SODIUM CHLORIDE 0.9% FLUSH
10.0000 mL | Freq: Once | INTRAVENOUS | Status: AC
  Administered 2021-11-09: 10 mL

## 2021-11-09 NOTE — Patient Instructions (Signed)
PICC Home Care Guide A peripherally inserted central catheter (PICC) is a form of IV access that allows medicines and IV fluids to be quickly put into the blood and spread throughout the body. The PICC is a long, thin, flexible tube (catheter) that is put into a vein in a person's arm or leg. The catheter ends in a large vein just outside the heart called the superior vena cava (SVC). After the PICC is put in, a chest X-ray may be done to make sure that it is in the right place. A PICC may be placed for different reasons, such as: To give medicines and liquid nutrition. To give IV fluids and blood products. To take blood samples often. If there is trouble placing a peripheral intravenous (PIV) catheter. If cared for properly, a PICC can remain in place for many months. Having a PICC can allow you to go home from the hospital sooner and continue treatment at home. Medicines and PICC care can be managed at home by a family member, caregiver, or home health care team. What are the risks? Generally, having a PICC is safe. However, problems may occur, including: A blood clot (thrombus) forming in or at the end of the PICC. A blood clot forming in a vein (deep vein thrombosis) or traveling to the lung (pulmonary embolism). Inflammation of the vein (phlebitis) in which the PICC is placed. Infection at the insertion site or in the blood. Blood infections from central lines, like PICCs, can be serious and often require a hospital stay. PICC malposition, or PICC movement or poor placement. A break or cut in the PICC. Do not use scissors near the PICC. Nerve or tendon irritation or injury during PICC insertion. How to care for your PICC Please follow the specific guidelines provided by your health care provider. Preventing infection You and any caregivers should wash your hands often with soap and water for at least 20 seconds. Wash hands: Before touching the PICC or the infusion device. Before changing a  bandage (dressing). Do not change the dressing unless you have been taught to do so and have shown you are able to change it safely. Flush the PICC as told. Tell your health care provider right away if the PICC is hard to flush or does not flush. Do not use force to flush the PICC. Use clean and germ-free (sterile) supplies only. Keep the supplies in a dry place. Do not reuse needles, syringes, or any other supplies. Reusing supplies can lead to infection. Keep the PICC dressing dry and secure it with tape if the edges stop sticking to your skin. Check your PICC insertion site every day for signs of infection. Check for: Redness, swelling, or pain. Fluid or blood. Warmth. Pus or a bad smell. Preventing other problems Do not use a syringe that is less than 10 mL to flush the PICC. Do not have your blood pressure checked on the arm in which the PICC is placed. Do not ever pull or tug on the PICC. Keep it secured to your arm with tape or a stretch wrap when not in use. Do not take the PICC out yourself. Only a trained health care provider should remove the PICC. Keep pets and children away from your PICC. How to care for your PICC dressing Keep your PICC dressing clean and dry to prevent infection. Do not take baths, swim, or use a hot tub until your health care provider approves. Ask your health care provider if you can take   showers. You may only be allowed to take sponge baths. When you are allowed to shower: Ask your health care provider to teach you how to wrap the PICC. Cover the PICC with clear plastic wrap and tape to keep it dry while showering. Follow instructions from your health care provider about how to take care of your insertion site and dressing. Make sure you: Wash your hands with soap and water for at least 20 seconds before and after you change your dressing. If soap and water are not available, use hand sanitizer. Change your dressing only if taught to do so by your health care  provider. Your PICC dressing needs to be changed if it becomes loose or wet. Leave stitches (sutures), skin glue, or adhesive strips in place. These skin closures may need to stay in place for 2 weeks or longer. If adhesive strip edges start to loosen and curl up, you may trim the loose edges. Do not remove adhesive strips completely unless your health care provider tells you to do that. Follow these instructions at home: Disposal of supplies Throw away any syringes in a disposal container that is meant for sharp items (sharps container). You can buy a sharps container from a pharmacy, or you can make one by using an empty, hard plastic bottle with a lid. Place any used dressings or infusion bags into a plastic bag. Throw that bag in the trash. General instructions  Always carry your PICC identification card or wear a medical alert bracelet. Keep the tube clamped at all times, unless it is being used. Always carry a smooth-edge clamp with you to clamp the PICC if it breaks. Do not use scissors or sharp objects near the tube. You may bend your arm and move it freely. If your PICC is near or at the bend of your elbow, avoid activity with repeated motion at the elbow. Avoid lifting heavy objects as told by your health care provider. Keep all follow-up visits. This is important. You will need to have your PICC dressing changed at least once a week. Contact a health care provider if: You have pain in your arm, ear, face, or teeth. You have a fever or chills. You have redness, swelling, or pain around the insertion site. You have fluid or blood coming from the insertion site. Your insertion site feels warm to the touch. You have pus or a bad smell coming from the insertion site. Your skin feels hard and raised around the insertion site. Your PICC dressing has gotten wet or is coming off and you have not been taught how to change it. Get help right away if: You have problems with your PICC, such as  your PICC: Was tugged or pulled and has partially come out. Do not  push the PICC back in. Cannot be flushed, is hard to flush, or leaks around the insertion site when it is flushed. Makes a flushing sound when it is flushed. Appears to have a hole or tear. Is accidentally pulled all the way out. If this happens, cover the insertion site with a gauze dressing. Do not throw the PICC away. Your health care provider will need to check it to be sure the entire catheter came out. You feel your heart racing or skipping beats, or you have chest pain. You have shortness of breath or trouble breathing. You have swelling, redness, warmth, or pain in the arm in which the PICC is placed. You have a red streak going up your arm that   starts under the PICC dressing. These symptoms may be an emergency. Get help right away. Call 911. Do not wait to see if the symptoms will go away. Do not drive yourself to the hospital. Summary A peripherally inserted central catheter (PICC) is a long, thin, flexible tube (catheter) that is put into a vein in the arm or leg. If cared for properly, a PICC can remain in place for many months. Having a PICC can allow you to go home from the hospital sooner and continue treatment at home. The PICC is inserted using a germ-free (sterile) technique by a specially trained health care provider. Only a trained health care provider should remove it. Do not have your blood pressure checked on the arm in which your PICC is placed. Always keep your PICC identification card with you. This information is not intended to replace advice given to you by your health care provider. Make sure you discuss any questions you have with your health care provider. Document Revised: 10/27/2020 Document Reviewed: 10/27/2020 Elsevier Patient Education  2023 Else[employer] Inc.  

## 2021-11-11 ENCOUNTER — Inpatient Hospital Stay: Payer: PRIVATE HEALTH INSURANCE

## 2021-11-11 VITALS — BP 112/88 | HR 93 | Temp 98.3°F | Resp 19

## 2021-11-11 DIAGNOSIS — C180 Malignant neoplasm of cecum: Secondary | ICD-10-CM

## 2021-11-11 DIAGNOSIS — Z5111 Encounter for antineoplastic chemotherapy: Secondary | ICD-10-CM | POA: Diagnosis not present

## 2021-11-11 MED ORDER — HEPARIN SOD (PORK) LOCK FLUSH 100 UNIT/ML IV SOLN
250.0000 [IU] | Freq: Once | INTRAVENOUS | Status: AC
  Administered 2021-11-11: 250 [IU]

## 2021-11-11 MED ORDER — SODIUM CHLORIDE 0.9% FLUSH
10.0000 mL | Freq: Once | INTRAVENOUS | Status: AC
  Administered 2021-11-11: 10 mL

## 2021-11-11 NOTE — Patient Instructions (Signed)
PICC Home Care Guide A peripherally inserted central catheter (PICC) is a form of IV access that allows medicines and IV fluids to be quickly put into the blood and spread throughout the body. The PICC is a long, thin, flexible tube (catheter) that is put into a vein in a person's arm or leg. The catheter ends in a large vein just outside the heart called the superior vena cava (SVC). After the PICC is put in, a chest X-ray may be done to make sure that it is in the right place. A PICC may be placed for different reasons, such as: To give medicines and liquid nutrition. To give IV fluids and blood products. To take blood samples often. If there is trouble placing a peripheral intravenous (PIV) catheter. If cared for properly, a PICC can remain in place for many months. Having a PICC can allow you to go home from the hospital sooner and continue treatment at home. Medicines and PICC care can be managed at home by a family member, caregiver, or home health care team. What are the risks? Generally, having a PICC is safe. However, problems may occur, including: A blood clot (thrombus) forming in or at the end of the PICC. A blood clot forming in a vein (deep vein thrombosis) or traveling to the lung (pulmonary embolism). Inflammation of the vein (phlebitis) in which the PICC is placed. Infection at the insertion site or in the blood. Blood infections from central lines, like PICCs, can be serious and often require a hospital stay. PICC malposition, or PICC movement or poor placement. A break or cut in the PICC. Do not use scissors near the PICC. Nerve or tendon irritation or injury during PICC insertion. How to care for your PICC Please follow the specific guidelines provided by your health care provider. Preventing infection You and any caregivers should wash your hands often with soap and water for at least 20 seconds. Wash hands: Before touching the PICC or the infusion device. Before changing a  bandage (dressing). Do not change the dressing unless you have been taught to do so and have shown you are able to change it safely. Flush the PICC as told. Tell your health care provider right away if the PICC is hard to flush or does not flush. Do not use force to flush the PICC. Use clean and germ-free (sterile) supplies only. Keep the supplies in a dry place. Do not reuse needles, syringes, or any other supplies. Reusing supplies can lead to infection. Keep the PICC dressing dry and secure it with tape if the edges stop sticking to your skin. Check your PICC insertion site every day for signs of infection. Check for: Redness, swelling, or pain. Fluid or blood. Warmth. Pus or a bad smell. Preventing other problems Do not use a syringe that is less than 10 mL to flush the PICC. Do not have your blood pressure checked on the arm in which the PICC is placed. Do not ever pull or tug on the PICC. Keep it secured to your arm with tape or a stretch wrap when not in use. Do not take the PICC out yourself. Only a trained health care provider should remove the PICC. Keep pets and children away from your PICC. How to care for your PICC dressing Keep your PICC dressing clean and dry to prevent infection. Do not take baths, swim, or use a hot tub until your health care provider approves. Ask your health care provider if you can take   showers. You may only be allowed to take sponge baths. When you are allowed to shower: Ask your health care provider to teach you how to wrap the PICC. Cover the PICC with clear plastic wrap and tape to keep it dry while showering. Follow instructions from your health care provider about how to take care of your insertion site and dressing. Make sure you: Wash your hands with soap and water for at least 20 seconds before and after you change your dressing. If soap and water are not available, use hand sanitizer. Change your dressing only if taught to do so by your health care  provider. Your PICC dressing needs to be changed if it becomes loose or wet. Leave stitches (sutures), skin glue, or adhesive strips in place. These skin closures may need to stay in place for 2 weeks or longer. If adhesive strip edges start to loosen and curl up, you may trim the loose edges. Do not remove adhesive strips completely unless your health care provider tells you to do that. Follow these instructions at home: Disposal of supplies Throw away any syringes in a disposal container that is meant for sharp items (sharps container). You can buy a sharps container from a pharmacy, or you can make one by using an empty, hard plastic bottle with a lid. Place any used dressings or infusion bags into a plastic bag. Throw that bag in the trash. General instructions  Always carry your PICC identification card or wear a medical alert bracelet. Keep the tube clamped at all times, unless it is being used. Always carry a smooth-edge clamp with you to clamp the PICC if it breaks. Do not use scissors or sharp objects near the tube. You may bend your arm and move it freely. If your PICC is near or at the bend of your elbow, avoid activity with repeated motion at the elbow. Avoid lifting heavy objects as told by your health care provider. Keep all follow-up visits. This is important. You will need to have your PICC dressing changed at least once a week. Contact a health care provider if: You have pain in your arm, ear, face, or teeth. You have a fever or chills. You have redness, swelling, or pain around the insertion site. You have fluid or blood coming from the insertion site. Your insertion site feels warm to the touch. You have pus or a bad smell coming from the insertion site. Your skin feels hard and raised around the insertion site. Your PICC dressing has gotten wet or is coming off and you have not been taught how to change it. Get help right away if: You have problems with your PICC, such as  your PICC: Was tugged or pulled and has partially come out. Do not  push the PICC back in. Cannot be flushed, is hard to flush, or leaks around the insertion site when it is flushed. Makes a flushing sound when it is flushed. Appears to have a hole or tear. Is accidentally pulled all the way out. If this happens, cover the insertion site with a gauze dressing. Do not throw the PICC away. Your health care provider will need to check it to be sure the entire catheter came out. You feel your heart racing or skipping beats, or you have chest pain. You have shortness of breath or trouble breathing. You have swelling, redness, warmth, or pain in the arm in which the PICC is placed. You have a red streak going up your arm that   starts under the PICC dressing. These symptoms may be an emergency. Get help right away. Call 911. Do not wait to see if the symptoms will go away. Do not drive yourself to the hospital. Summary A peripherally inserted central catheter (PICC) is a long, thin, flexible tube (catheter) that is put into a vein in the arm or leg. If cared for properly, a PICC can remain in place for many months. Having a PICC can allow you to go home from the hospital sooner and continue treatment at home. The PICC is inserted using a germ-free (sterile) technique by a specially trained health care provider. Only a trained health care provider should remove it. Do not have your blood pressure checked on the arm in which your PICC is placed. Always keep your PICC identification card with you. This information is not intended to replace advice given to you by your health care provider. Make sure you discuss any questions you have with your health care provider. Document Revised: 10/27/2020 Document Reviewed: 10/27/2020 Elsevier Patient Education  2023 Else[employer] Inc.  

## 2021-11-14 ENCOUNTER — Other Ambulatory Visit: Payer: Self-pay

## 2021-11-14 ENCOUNTER — Inpatient Hospital Stay: Payer: PRIVATE HEALTH INSURANCE

## 2021-11-14 VITALS — BP 121/62 | HR 66 | Temp 98.0°F | Resp 17

## 2021-11-14 DIAGNOSIS — Z5111 Encounter for antineoplastic chemotherapy: Secondary | ICD-10-CM | POA: Diagnosis not present

## 2021-11-14 DIAGNOSIS — C180 Malignant neoplasm of cecum: Secondary | ICD-10-CM

## 2021-11-14 DIAGNOSIS — Z452 Encounter for adjustment and management of vascular access device: Secondary | ICD-10-CM

## 2021-11-14 MED ORDER — SODIUM CHLORIDE 0.9% FLUSH
10.0000 mL | Freq: Once | INTRAVENOUS | Status: AC
  Administered 2021-11-14: 10 mL

## 2021-11-14 MED ORDER — HEPARIN SOD (PORK) LOCK FLUSH 100 UNIT/ML IV SOLN
250.0000 [IU] | Freq: Once | INTRAVENOUS | Status: AC
  Administered 2021-11-14: 250 [IU]

## 2021-11-14 NOTE — Progress Notes (Signed)
1520: Attempted to flush PICC line; able to flush saline but bubbling noted around the biopatch. Unable to aspirate blood. Pt requesting to remove PICC at this time. Order received from Dr. Marin Olp to remove PICC and push back treatment 2 weeks to let patient go swimming.   1525: Pt placed in supine position and told to take a deep breath in. PICC line removed at this time and patient told to exhale as picc was being removed. PICC removed with catheter tip intact and measuring 49 cm. Vaseline dressing placed to site and pressure held for 5 minutes and then occlusive dressing placed. Pt educated that she will have to remain flat for 30 minutes. Pt verbalized understanding. Maggie RN at bedside to watch this RN remove PICC.   1600: Pt awake and alert with no complaints. Pt educated that if she has any shortness of breath to seek emergency care. Vitals taken and stable. Pt denies any complaints. Pt excited to have PICC removed. Pt aware to keep pressure dressing on for 4 hours before removal. Pt verbalized understanding and had no further questions.

## 2021-11-14 NOTE — Patient Instructions (Signed)
PICC Removal, Adult, Care After The following information offers guidance on how to care for yourself after your procedure. Your health care provider may also give you more specific instructions. If you have problems or questions, contact your health care provider. What can I expect after the procedure? After the procedure, it is common to have: Tenderness or soreness. Redness, swelling, or a scab at the place where your PICC was removed (exit site). Follow these instructions at home: For the first 24 hours after the procedure: Keep the bandage (dressing) on your exit site clean and dry. Do not remove your dressing until your health care provider tells you to do so. Do not lift anything heavy or do activities that require great effort until your health care provider says it is okay. You should avoid: Lifting weights. Doing yard work. Doing any physical activity with repetitive arm movement. Watch closely for any signs of an air bubble in the vein (air embolism). This is a rare but serious complication. Signs of an air embolism include trouble breathing, wheezing, chest pain, or a fast pulse. If you have signs of an air embolism, call 911 right away and lie down on your left side to keep the air from moving into your lungs. After 24 hours have passed:  Remove your dressing as told by your health care provider. Wash your hands with soap and water for at least 20 seconds before and after you change your dressing. If soap and water are not available, use hand sanitizer. Return to your normal activities as told by your health care provider. A small scab may develop over the exit site. Do not pick at the scab. When bathing or showering, gently wash the exit site with soap and water. Pat it dry. Watch for signs of infection, such as: A fever or chills. Swollen glands under your arm. More redness, swelling, or soreness around your arm. Blood, fluid, or pus coming from your exit site. Warmth or a  bad smell coming from your exit site. A red streak spreading away from your exit site. General instructions Take over-the-counter and prescription medicines only as told by your health care provider. Do not take any new medicines without checking with your health care provider first. If you were given an antibiotic ointment, apply it as told by your health care provider. Keep all follow-up visits. This is important. Contact a health care provider if: You have a fever or chills. You have swelling at your exit site or swollen glands under your arm. You have signs of infection at your exit site. You have soreness, redness, or swelling in your arm that gets worse. Get help right away if: You have numbness or tingling in your fingers, hand, or arm. Your arm looks blue and feels cold. You have signs of an air embolism, such as trouble breathing, wheezing, chest pain, or a fast pulse. These symptoms may be an emergency. Get medical help right away. Call 911. Do not wait to see if the symptoms will go away. Do not drive yourself to the hospital. Summary After a PICC is removed, it is common to have tenderness or soreness, redness, swelling, or a scab at the exit site. Keep the bandage (dressing) over the exit site clean and dry. Do not remove the dressing until your health care provider tells you to do so. Do not lift anything heavy or do activities that require great effort until your health care provider says it is okay. Watch closely for any signs   of an air bubble (air embolism). If you have signs of an air embolism, call 911 right away and lie down on your left side. This information is not intended to replace advice given to you by your health care provider. Make sure you discuss any questions you have with your health care provider. Document Revised: 10/27/2020 Document Reviewed: 10/27/2020 Elsevier Patient Education  2023 Elsevier Inc.  

## 2021-11-15 ENCOUNTER — Other Ambulatory Visit: Payer: Self-pay

## 2021-11-16 ENCOUNTER — Inpatient Hospital Stay: Payer: PRIVATE HEALTH INSURANCE | Admitting: Family

## 2021-11-16 ENCOUNTER — Inpatient Hospital Stay: Payer: PRIVATE HEALTH INSURANCE

## 2021-11-29 ENCOUNTER — Ambulatory Visit (HOSPITAL_COMMUNITY)
Admission: RE | Admit: 2021-11-29 | Discharge: 2021-11-29 | Disposition: A | Discharge status: Home / Self Care | Payer: PRIVATE HEALTH INSURANCE | Source: Ambulatory Visit | Attending: Hematology & Oncology | Admitting: Hematology & Oncology

## 2021-11-29 ENCOUNTER — Other Ambulatory Visit: Payer: Self-pay

## 2021-11-29 DIAGNOSIS — C180 Malignant neoplasm of cecum: Secondary | ICD-10-CM | POA: Diagnosis present

## 2021-11-29 HISTORY — PX: IR US GUIDE VASC ACCESS RIGHT: IMG2390

## 2021-11-29 MED ORDER — HEPARIN SOD (PORK) LOCK FLUSH 100 UNIT/ML IV SOLN
INTRAVENOUS | Status: AC
  Filled 2021-11-29: qty 5

## 2021-11-29 MED ORDER — LIDOCAINE HCL 1 % IJ SOLN
INTRAMUSCULAR | Status: AC
  Filled 2021-11-29: qty 20

## 2021-11-29 MED ORDER — LIDOCAINE HCL 1 % IJ SOLN
INTRAMUSCULAR | Status: AC | PRN
  Administered 2021-11-29: 5 mL

## 2021-11-29 MED ORDER — HEPARIN SOD (PORK) LOCK FLUSH 100 UNIT/ML IV SOLN
INTRAVENOUS | Status: AC | PRN
  Administered 2021-11-29: 500 [IU]

## 2021-11-29 NOTE — Procedures (Signed)
PROCEDURE SUMMARY:  Successful placement of doub;e lumen PICC line to left brachial vein. Length 41 cm Tip at lower SVC/RA PICC capped No complications Ready for use  EBL < 10 mL  Right PICC was first attempted, guide wire was able to be advanced to SVC/RA but PICC did not advance after right subclavian. Patient may not be candidate for right PICC in the future.     Khallid Pasillas H Marjean Imperato PA-C 11/29/2021, 3:16 PM

## 2021-11-30 ENCOUNTER — Inpatient Hospital Stay: Payer: PRIVATE HEALTH INSURANCE

## 2021-11-30 ENCOUNTER — Other Ambulatory Visit: Payer: Self-pay | Admitting: Family

## 2021-11-30 ENCOUNTER — Inpatient Hospital Stay: Payer: PRIVATE HEALTH INSURANCE | Admitting: Licensed Clinical Social Worker

## 2021-11-30 ENCOUNTER — Inpatient Hospital Stay (HOSPITAL_BASED_OUTPATIENT_CLINIC_OR_DEPARTMENT_OTHER): Payer: PRIVATE HEALTH INSURANCE | Admitting: Family

## 2021-11-30 ENCOUNTER — Other Ambulatory Visit: Payer: Self-pay

## 2021-11-30 ENCOUNTER — Inpatient Hospital Stay: Payer: PRIVATE HEALTH INSURANCE | Attending: Hematology & Oncology

## 2021-11-30 ENCOUNTER — Encounter: Payer: Self-pay | Admitting: Family

## 2021-11-30 VITALS — BP 128/68 | HR 84 | Temp 98.1°F | Resp 17 | Ht 64.0 in | Wt 262.0 lb

## 2021-11-30 DIAGNOSIS — D509 Iron deficiency anemia, unspecified: Secondary | ICD-10-CM | POA: Diagnosis not present

## 2021-11-30 DIAGNOSIS — C787 Secondary malignant neoplasm of liver and intrahepatic bile duct: Secondary | ICD-10-CM

## 2021-11-30 DIAGNOSIS — I2699 Other pulmonary embolism without acute cor pulmonale: Secondary | ICD-10-CM | POA: Insufficient documentation

## 2021-11-30 DIAGNOSIS — C180 Malignant neoplasm of cecum: Secondary | ICD-10-CM | POA: Insufficient documentation

## 2021-11-30 DIAGNOSIS — Z5189 Encounter for other specified aftercare: Secondary | ICD-10-CM | POA: Insufficient documentation

## 2021-11-30 DIAGNOSIS — I82411 Acute embolism and thrombosis of right femoral vein: Secondary | ICD-10-CM | POA: Insufficient documentation

## 2021-11-30 DIAGNOSIS — Z7901 Long term (current) use of anticoagulants: Secondary | ICD-10-CM | POA: Diagnosis not present

## 2021-11-30 DIAGNOSIS — Z452 Encounter for adjustment and management of vascular access device: Secondary | ICD-10-CM | POA: Diagnosis not present

## 2021-11-30 DIAGNOSIS — Z5111 Encounter for antineoplastic chemotherapy: Secondary | ICD-10-CM | POA: Diagnosis not present

## 2021-11-30 LAB — CMP (CANCER CENTER ONLY)
ALT: 9 U/L (ref 0–44)
AST: 14 U/L — ABNORMAL LOW (ref 15–41)
Albumin: 3.8 g/dL (ref 3.5–5.0)
Alkaline Phosphatase: 51 U/L (ref 38–126)
Anion gap: 8 (ref 5–15)
BUN: 9 mg/dL (ref 6–20)
CO2: 27 mmol/L (ref 22–32)
Calcium: 9.2 mg/dL (ref 8.9–10.3)
Chloride: 107 mmol/L (ref 98–111)
Creatinine: 0.66 mg/dL (ref 0.44–1.00)
GFR, Estimated: >60 mL/min (ref >60)
Glucose, Bld: 110 mg/dL — ABNORMAL HIGH (ref 70–99)
Potassium: 3.5 mmol/L (ref 3.5–5.1)
Sodium: 142 mmol/L (ref 135–145)
Total Bilirubin: 0.5 mg/dL (ref 0.3–1.2)
Total Protein: 5.8 g/dL — ABNORMAL LOW (ref 6.5–8.1)

## 2021-11-30 LAB — LACTATE DEHYDROGENASE: LDH: 166 U/L (ref 98–192)

## 2021-11-30 LAB — CBC WITH DIFFERENTIAL (CANCER CENTER ONLY)
Abs Immature Granulocytes: 0.02 10*3/uL (ref 0.00–0.07)
Basophils Absolute: 0.1 10*3/uL (ref 0.0–0.1)
Basophils Relative: 1 %
Eosinophils Absolute: 0.1 10*3/uL (ref 0.0–0.5)
Eosinophils Relative: 2 %
HCT: 35.5 % — ABNORMAL LOW (ref 36.0–46.0)
Hemoglobin: 11.5 g/dL — ABNORMAL LOW (ref 12.0–15.0)
Immature Granulocytes: 0 %
Lymphocytes Relative: 33 %
Lymphs Abs: 1.8 10*3/uL (ref 0.7–4.0)
MCH: 30.7 pg (ref 26.0–34.0)
MCHC: 32.4 g/dL (ref 30.0–36.0)
MCV: 94.9 fL (ref 80.0–100.0)
Monocytes Absolute: 0.6 10*3/uL (ref 0.1–1.0)
Monocytes Relative: 10 %
Neutro Abs: 3 10*3/uL (ref 1.7–7.7)
Neutrophils Relative %: 54 %
Platelet Count: 293 10*3/uL (ref 150–400)
RBC: 3.74 MIL/uL — ABNORMAL LOW (ref 3.87–5.11)
RDW: 17.2 % — ABNORMAL HIGH (ref 11.5–15.5)
Smear Review: NORMAL
WBC Count: 5.5 10*3/uL (ref 4.0–10.5)
nRBC: 0 % (ref 0.0–0.2)

## 2021-11-30 LAB — FERRITIN: Ferritin: 304 ng/mL (ref 11–307)

## 2021-11-30 LAB — CEA (IN HOUSE-CHCC): CEA (CHCC-In House): 2.54 ng/mL (ref 0.00–5.00)

## 2021-11-30 LAB — IRON AND IRON BINDING CAPACITY (CC-WL,HP ONLY)
Iron: 44 ug/dL (ref 28–170)
Saturation Ratios: 20 % (ref 10.4–31.8)
TIBC: 220 ug/dL — ABNORMAL LOW (ref 250–450)
UIBC: 176 ug/dL (ref 148–442)

## 2021-11-30 MED ORDER — SODIUM CHLORIDE 0.9% FLUSH: 10.0000 mL | INTRAVENOUS | Status: DC | PRN

## 2021-11-30 MED ORDER — SODIUM CHLORIDE 0.9 % IV SOLN
400.0000 mg/m2 | Freq: Once | INTRAVENOUS | Status: AC
  Administered 2021-11-30: 956 mg via INTRAVENOUS
  Filled 2021-11-30: qty 47.8

## 2021-11-30 MED ORDER — SODIUM CHLORIDE 0.9 % IV SOLN
10.0000 mg | Freq: Once | INTRAVENOUS | Status: AC
  Administered 2021-11-30: 10 mg via INTRAVENOUS
  Filled 2021-11-30: qty 10

## 2021-11-30 MED ORDER — HEPARIN SOD (PORK) LOCK FLUSH 100 UNIT/ML IV SOLN: 250.0000 [IU] | Freq: Once | INTRAVENOUS | Status: DC | PRN

## 2021-11-30 MED ORDER — SODIUM CHLORIDE 0.9 % IV SOLN
165.0000 mg/m2 | Freq: Once | INTRAVENOUS | Status: AC
  Administered 2021-11-30: 400 mg via INTRAVENOUS
  Filled 2021-11-30: qty 20

## 2021-11-30 MED ORDER — DEXTROSE 5 % IV SOLN: Freq: Once | INTRAVENOUS | Status: AC

## 2021-11-30 MED ORDER — SODIUM CHLORIDE 0.9 % IV SOLN
150.0000 mg | Freq: Once | INTRAVENOUS | Status: AC
  Administered 2021-11-30: 150 mg via INTRAVENOUS
  Filled 2021-11-30: qty 150

## 2021-11-30 MED ORDER — PALONOSETRON HCL INJECTION 0.25 MG/5ML
0.2500 mg | Freq: Once | INTRAVENOUS | Status: AC
  Administered 2021-11-30: 0.25 mg via INTRAVENOUS

## 2021-11-30 MED ORDER — SODIUM CHLORIDE 0.9 % IV SOLN
2400.0000 mg/m2 | INTRAVENOUS | Status: AC
  Administered 2021-11-30: 5750 mg via INTRAVENOUS
  Filled 2021-11-30: qty 115

## 2021-11-30 MED ORDER — SODIUM CHLORIDE 0.9 % IV SOLN: Freq: Once | INTRAVENOUS | Status: DC

## 2021-11-30 MED ORDER — ATROPINE SULFATE 1 MG/ML IV SOLN
0.5000 mg | Freq: Once | INTRAVENOUS | Status: AC | PRN
  Administered 2021-11-30: 0.5 mg via INTRAVENOUS

## 2021-11-30 NOTE — Progress Notes (Signed)
Patient has neuropathy in her feet which inhibits her from extensive exercise.

## 2021-11-30 NOTE — Progress Notes (Addendum)
Hematology and Oncology Follow Up Visit  QUINTA EIMER [id number] 1965-06-13 56 y.o. 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 Rollyson is here today for follow-up and treatment. She is doing fairly well.  CEA last visit was stable at 3.16.  She had her PICC line replaced yesterday and has had a little bit of blood oozing at the incision site. This is minimal but we will have her hold her Xarelto for 2 days and restart on Friday. No redness, bruising or hematoma noted at the site.  No other blood loss noted. No petechiae.  No fever, chills, n/v, cough, rash, dizziness, SOB, chest pain, palpitations, abdominal pain or changes in bowel or bladder habits.  No swelling or tenderness in her extremities at this time.  Neuropathy in her hands and feet is unchanged from baseline.  No falls or syncope reported.  Appetite and hydration are good. Her weight is 262 lbs.   ECOG Performance Status: 1 - Symptomatic but completely ambulatory  Medications:  Allergies as of 11/30/2021   No Known Allergies      Medication List        Accurate as of November 30, 2021  8:53 AM. If you have any questions, ask your nurse or doctor.          dexamethasone 6 MG tablet Commonly known as: DECADRON Take 1 tablet (6 mg total) by mouth See admin instructions. Take 6 mg by mouth daily for 3 days, starting the day after chemotherapy. Take with food.   loperamide 2 MG tablet Commonly known as: Imodium A-D Take 2 at onset of diarrhea, then 1 every 2hrs until 12hr without a BM. May take 2 tab every 4hrs at bedtime. If diarrhea recurs repeat.    ondansetron 8 MG tablet Commonly known as: Zofran Take 1 tablet (8 mg total) by mouth 2 (two) times daily as needed. Start on day 3 after chemotherapy.   prochlorperazine 10 MG tablet Commonly known as: COMPAZINE Take 1 tablet (10 mg total) by mouth every 6 (six) hours as needed (Nausea or vomiting).   rivaroxaban 10 MG Tabs tablet Commonly known as: XARELTO Take 1 tablet (10 mg total) by mouth daily with supper.        Allergies: No Known Allergies  Past Medical History, Surgical history, Social history, and Family History were reviewed and updated.  Review of Systems: All other 10 point review of systems is negative.   Physical Exam:  height is 5' 4"  (1.626 m) and weight is 262 lb (118.8 kg). Her oral temperature is 98.1 F (36.7 C). Her blood pressure is 128/68 and her pulse is 84. Her respiration is 17 and oxygen saturation is 100%.   Wt Readings from Last 3 Encounters:  11/30/21 262 lb (118.8 kg)  11/02/21 253 lb (114.8 kg)  10/19/21 256 lb 12.8 oz (116.5 kg)    Ocular: Sclerae unicteric, pupils equal, round and reactive to light Ear-nose-throat: Oropharynx clear, dentition fair Lymphatic: No cervical or supraclavicular adenopathy Lungs no rales or rhonchi, good excursion bilaterally Heart regular rate and rhythm, no murmur appreciated Abd soft, nontender, positive  bowel sounds MSK no focal spinal tenderness, no joint edema Neuro: non-focal, well-oriented, appropriate affect Breasts: Deferred   Lab Results  Component Value Date   WBC 5.5 11/30/2021   HGB 11.5 (L) 11/30/2021   HCT 35.5 (L) 11/30/2021   MCV 94.9 11/30/2021   PLT 293 11/30/2021   Lab Results  Component Value Date   FERRITIN 510 (H) 11/02/2021   IRON 58 11/02/2021   TIBC 230 (L) 11/02/2021   UIBC 172 11/02/2021   IRONPCTSAT 25 11/02/2021   Lab Results  Component Value Date   RETICCTPCT 2.8 11/02/2021   RBC 3.74 (L) 11/30/2021   No results found for: "KPAFRELGTCHN", "LAMBDASER",  "KAPLAMBRATIO" No results found for: "IGGSERUM", "IGA", "IGMSERUM" No results found for: "TOTALPROTELP", "ALBUMINELP", "A1GS", "A2GS", "BETS", "BETA2SER", "GAMS", "MSPIKE", "SPEI"   Chemistry      Component Value Date/Time   NA 143 11/02/2021 0820   K 3.6 11/02/2021 0820   CL 107 11/02/2021 0820   CO2 28 11/02/2021 0820   BUN 10 11/02/2021 0820   CREATININE 0.77 11/02/2021 0820      Component Value Date/Time   CALCIUM 9.0 11/02/2021 0820   ALKPHOS 72 11/02/2021 0820   AST 17 11/02/2021 0820   ALT 18 11/02/2021 0820   BILITOT 0.4 11/02/2021 0820       Impression and Plan: Ms. Gilchrest is a very pleasant 56 yo African American female with metastatic 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   We will proceed with treatment today as planned.  CEA and iron studies pending.  PICC line has been replaced. We will flush 3 days per week.  Follow-up in 2 weeks. We will get repeat MRI of the abdomen and chest CT the week before.   Lottie Dawson, NP 8/9/20238:53 AM

## 2021-11-30 NOTE — Progress Notes (Signed)
[facility]  Initial Assessment   Lauren Mcpherson is a 56 y.o. year old female presenting alone. Clinical Social Work was referred by nurse for assessment of psychosocial needs.   SDOH (Social Determinants of Health) assessments performed: Yes SDOH Interventions    Flowsheet Row Most Recent Value  SDOH Interventions   Food Insecurity Interventions Intervention Not Indicated  Financial Strain Interventions Intervention Not Indicated  Housing Interventions Intervention Not Indicated  Physical Activity Interventions Other (Comments)  Social Connections Interventions Intervention Not Indicated  Transportation Interventions Other (Comment)       SDOH Screenings   Alcohol Screen: Not on file  Depression (IHK7-4): Not on file  Financial Resource Strain: Low Risk  (11/30/2021)   Overall Financial Resource Strain (CARDIA)    Difficulty of Paying Living Expenses: Not very hard  Food Insecurity: No Food Insecurity (11/30/2021)   Hunger Vital Sign    Worried About Running Out of Food in the Last Year: Never true    Ran Out of Food in the Last Year: Never true  Housing: Low Risk  (11/30/2021)   Housing    Last Housing Risk Score: 0  Physical Activity: Insufficiently Active (11/30/2021)   Exercise Vital Sign    Days of Exercise per Week: 3 days    Minutes of Exercise per Session: 30 min  Social Connections: Socially Integrated (11/30/2021)   Social Connection and Isolation Panel [NHANES]    Frequency of Communication with Friends and Family: More than three times a week    Frequency of Social Gatherings with Friends and Family: More than three times a week    Attends Religious Services: More than 4 times per year    Active Member of [employer] or Organizations: Yes    Attends Archivist Meetings: More than 4 times per year    Marital Status: Married  Stress: Not on file  Tobacco Use: Medium Risk (11/30/2021)   Patient History    Smoking Tobacco Use: Former    Smokeless  Tobacco Use: Never    Passive Exposure: Not on file  Transportation Needs: Unmet Transportation Needs (11/30/2021)   PRAPARE - Hydrologist (Medical): Yes    Lack of Transportation (Non-Medical): Yes     Distress Screen completed: No     No data to display            Family/Social Information:  Housing Arrangement: patient lives with her husband.  Her son attends college in [place].   Family members/support persons in your life? Family and Friends Transportation concerns: yes  Employment: Disabled Patient just recently started receiving disability.  She previously worked.  Income source: Social Security Disability Financial concerns: Yes, due to illness and/or loss of work during treatment Type of concern: Transportation.  She has had to take an UBER to her appointments, which are expensive. Food access concerns: no Religious or spiritual practice: Yes-Patient expressed having deep faith in God. Services Currently in place:  [employer].  Coping/ Adjustment to diagnosis: Patient understands treatment plan and what happens next? 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 Patient enjoys time with family/ friends Current coping skills/ strengths: Ability for insight , Average or above average intelligence , Capable of independent living , Communication skills , General fund of knowledge , Motivation for treatment/growth , Religious Affiliation , and Supportive family/friends  SUMMARY: Current SDOH Barriers:  Transportation  Clinical Social Work Clinical Goal(s):  Explore community resource options for unmet needs related to:  Transportation  Interventions: Discussed common feeling and emotions when being diagnosed with cancer, and the importance of support during  treatment Informed patient of the support team roles and support services at [facility] [place] Provided Lauren Mcpherson contact information and encouraged patient to call with any questions or concerns Provided patient with information about the [employer] and provided a gas card.  Also gave her Arts administrator and [employer].   Follow Up Plan: Patient will contact CSW with any support or resource needs Patient verbalizes understanding of plan: Yes    Lauren Pickle [employer], LCSW

## 2021-11-30 NOTE — Progress Notes (Signed)
Provided $50 gas card.  She has been scheduling appointments so that her sister can provide transportation from the clinic.

## 2021-11-30 NOTE — Patient Instructions (Signed)

## 2021-12-01 ENCOUNTER — Other Ambulatory Visit: Payer: Self-pay

## 2021-12-02 ENCOUNTER — Inpatient Hospital Stay: Payer: PRIVATE HEALTH INSURANCE

## 2021-12-02 VITALS — BP 120/80 | HR 78 | Temp 98.1°F | Resp 17

## 2021-12-02 DIAGNOSIS — Z5111 Encounter for antineoplastic chemotherapy: Secondary | ICD-10-CM | POA: Diagnosis not present

## 2021-12-02 DIAGNOSIS — C180 Malignant neoplasm of cecum: Secondary | ICD-10-CM

## 2021-12-02 MED ORDER — PEGFILGRASTIM-CBQV 6 MG/0.6ML ~~LOC~~ SOSY
6.0000 mg | PREFILLED_SYRINGE | Freq: Once | SUBCUTANEOUS | Status: AC
  Administered 2021-12-02: 6 mg via SUBCUTANEOUS
  Filled 2021-12-02: qty 0.6

## 2021-12-02 MED ORDER — HEPARIN SOD (PORK) LOCK FLUSH 100 UNIT/ML IV SOLN
250.0000 [IU] | Freq: Once | INTRAVENOUS | Status: AC | PRN
  Administered 2021-12-02: 250 [IU]

## 2021-12-02 MED ORDER — SODIUM CHLORIDE 0.9% FLUSH
10.0000 mL | INTRAVENOUS | Status: DC | PRN
  Administered 2021-12-02: 10 mL

## 2021-12-02 NOTE — Patient Instructions (Signed)

## 2021-12-05 ENCOUNTER — Inpatient Hospital Stay: Payer: PRIVATE HEALTH INSURANCE

## 2021-12-05 DIAGNOSIS — C180 Malignant neoplasm of cecum: Secondary | ICD-10-CM

## 2021-12-05 DIAGNOSIS — Z5111 Encounter for antineoplastic chemotherapy: Secondary | ICD-10-CM | POA: Diagnosis not present

## 2021-12-05 MED ORDER — SODIUM CHLORIDE 0.9% FLUSH
10.0000 mL | Freq: Once | INTRAVENOUS | Status: AC
  Administered 2021-12-05: 10 mL via INTRAVENOUS

## 2021-12-05 MED ORDER — HEPARIN SOD (PORK) LOCK FLUSH 100 UNIT/ML IV SOLN
500.0000 [IU] | Freq: Once | INTRAVENOUS | Status: AC
  Administered 2021-12-05: 500 [IU] via INTRAVENOUS

## 2021-12-05 NOTE — Patient Instructions (Signed)
PICC Removal, Adult, Care After The following information offers guidance on how to care for yourself after your procedure. Your health care provider may also give you more specific instructions. If you have problems or questions, contact your health care provider. What can I expect after the procedure? After the procedure, it is common to have: Tenderness or soreness. Redness, swelling, or a scab at the place where your PICC was removed (exit site). Follow these instructions at home: For the first 24 hours after the procedure: Keep the bandage (dressing) on your exit site clean and dry. Do not remove your dressing until your health care provider tells you to do so. Do not lift anything heavy or do activities that require great effort until your health care provider says it is okay. You should avoid: Lifting weights. Doing yard work. Doing any physical activity with repetitive arm movement. Watch closely for any signs of an air bubble in the vein (air embolism). This is a rare but serious complication. Signs of an air embolism include trouble breathing, wheezing, chest pain, or a fast pulse. If you have signs of an air embolism, call 911 right away and lie down on your left side to keep the air from moving into your lungs. After 24 hours have passed:  Remove your dressing as told by your health care provider. Wash your hands with soap and water for at least 20 seconds before and after you change your dressing. If soap and water are not available, use hand sanitizer. Return to your normal activities as told by your health care provider. A small scab may develop over the exit site. Do not pick at the scab. When bathing or showering, gently wash the exit site with soap and water. Pat it dry. Watch for signs of infection, such as: A fever or chills. Swollen glands under your arm. More redness, swelling, or soreness around your arm. Blood, fluid, or pus coming from your exit site. Warmth or a  bad smell coming from your exit site. A red streak spreading away from your exit site. General instructions Take over-the-counter and prescription medicines only as told by your health care provider. Do not take any new medicines without checking with your health care provider first. If you were given an antibiotic ointment, apply it as told by your health care provider. Keep all follow-up visits. This is important. Contact a health care provider if: You have a fever or chills. You have swelling at your exit site or swollen glands under your arm. You have signs of infection at your exit site. You have soreness, redness, or swelling in your arm that gets worse. Get help right away if: You have numbness or tingling in your fingers, hand, or arm. Your arm looks blue and feels cold. You have signs of an air embolism, such as trouble breathing, wheezing, chest pain, or a fast pulse. These symptoms may be an emergency. Get medical help right away. Call 911. Do not wait to see if the symptoms will go away. Do not drive yourself to the hospital. Summary After a PICC is removed, it is common to have tenderness or soreness, redness, swelling, or a scab at the exit site. Keep the bandage (dressing) over the exit site clean and dry. Do not remove the dressing until your health care provider tells you to do so. Do not lift anything heavy or do activities that require great effort until your health care provider says it is okay. Watch closely for any signs   of an air bubble (air embolism). If you have signs of an air embolism, call 911 right away and lie down on your left side. This information is not intended to replace advice given to you by your health care provider. Make sure you discuss any questions you have with your health care provider. Document Revised: 10/27/2020 Document Reviewed: 10/27/2020 Elsevier Patient Education  2023 Elsevier Inc.  

## 2021-12-07 ENCOUNTER — Inpatient Hospital Stay: Payer: PRIVATE HEALTH INSURANCE

## 2021-12-07 ENCOUNTER — Other Ambulatory Visit: Payer: Self-pay

## 2021-12-07 VITALS — BP 120/74 | HR 100 | Temp 97.7°F | Resp 17

## 2021-12-07 DIAGNOSIS — C180 Malignant neoplasm of cecum: Secondary | ICD-10-CM

## 2021-12-07 DIAGNOSIS — Z5111 Encounter for antineoplastic chemotherapy: Secondary | ICD-10-CM | POA: Diagnosis not present

## 2021-12-07 MED ORDER — SODIUM CHLORIDE 0.9% FLUSH
10.0000 mL | Freq: Once | INTRAVENOUS | Status: AC
  Administered 2021-12-07: 10 mL

## 2021-12-07 MED ORDER — HEPARIN SOD (PORK) LOCK FLUSH 100 UNIT/ML IV SOLN
500.0000 [IU] | Freq: Once | INTRAVENOUS | Status: AC
  Administered 2021-12-07: 500 [IU]

## 2021-12-07 NOTE — Patient Instructions (Signed)
PICC Home Care Guide A peripherally inserted central catheter (PICC) is a form of IV access that allows medicines and IV fluids to be quickly put into the blood and spread throughout the body. The PICC is a long, thin, flexible tube (catheter) that is put into a vein in a person's arm or leg. The catheter ends in a large vein just outside the heart called the superior vena cava (SVC). After the PICC is put in, a chest X-ray may be done to make sure that it is in the right place. A PICC may be placed for different reasons, such as: To give medicines and liquid nutrition. To give IV fluids and blood products. To take blood samples often. If there is trouble placing a peripheral intravenous (PIV) catheter. If cared for properly, a PICC can remain in place for many months. Having a PICC can allow you to go home from the hospital sooner and continue treatment at home. Medicines and PICC care can be managed at home by a family member, caregiver, or home health care team. What are the risks? Generally, having a PICC is safe. However, problems may occur, including: A blood clot (thrombus) forming in or at the end of the PICC. A blood clot forming in a vein (deep vein thrombosis) or traveling to the lung (pulmonary embolism). Inflammation of the vein (phlebitis) in which the PICC is placed. Infection at the insertion site or in the blood. Blood infections from central lines, like PICCs, can be serious and often require a hospital stay. PICC malposition, or PICC movement or poor placement. A break or cut in the PICC. Do not use scissors near the PICC. Nerve or tendon irritation or injury during PICC insertion. How to care for your PICC Please follow the specific guidelines provided by your health care provider. Preventing infection You and any caregivers should wash your hands often with soap and water for at least 20 seconds. Wash hands: Before touching the PICC or the infusion device. Before changing a  bandage (dressing). Do not change the dressing unless you have been taught to do so and have shown you are able to change it safely. Flush the PICC as told. Tell your health care provider right away if the PICC is hard to flush or does not flush. Do not use force to flush the PICC. Use clean and germ-free (sterile) supplies only. Keep the supplies in a dry place. Do not reuse needles, syringes, or any other supplies. Reusing supplies can lead to infection. Keep the PICC dressing dry and secure it with tape if the edges stop sticking to your skin. Check your PICC insertion site every day for signs of infection. Check for: Redness, swelling, or pain. Fluid or blood. Warmth. Pus or a bad smell. Preventing other problems Do not use a syringe that is less than 10 mL to flush the PICC. Do not have your blood pressure checked on the arm in which the PICC is placed. Do not ever pull or tug on the PICC. Keep it secured to your arm with tape or a stretch wrap when not in use. Do not take the PICC out yourself. Only a trained health care provider should remove the PICC. Keep pets and children away from your PICC. How to care for your PICC dressing Keep your PICC dressing clean and dry to prevent infection. Do not take baths, swim, or use a hot tub until your health care provider approves. Ask your health care provider if you can take   showers. You may only be allowed to take sponge baths. When you are allowed to shower: Ask your health care provider to teach you how to wrap the PICC. Cover the PICC with clear plastic wrap and tape to keep it dry while showering. Follow instructions from your health care provider about how to take care of your insertion site and dressing. Make sure you: Wash your hands with soap and water for at least 20 seconds before and after you change your dressing. If soap and water are not available, use hand sanitizer. Change your dressing only if taught to do so by your health care  provider. Your PICC dressing needs to be changed if it becomes loose or wet. Leave stitches (sutures), skin glue, or adhesive strips in place. These skin closures may need to stay in place for 2 weeks or longer. If adhesive strip edges start to loosen and curl up, you may trim the loose edges. Do not remove adhesive strips completely unless your health care provider tells you to do that. Follow these instructions at home: Disposal of supplies Throw away any syringes in a disposal container that is meant for sharp items (sharps container). You can buy a sharps container from a pharmacy, or you can make one by using an empty, hard plastic bottle with a lid. Place any used dressings or infusion bags into a plastic bag. Throw that bag in the trash. General instructions  Always carry your PICC identification card or wear a medical alert bracelet. Keep the tube clamped at all times, unless it is being used. Always carry a smooth-edge clamp with you to clamp the PICC if it breaks. Do not use scissors or sharp objects near the tube. You may bend your arm and move it freely. If your PICC is near or at the bend of your elbow, avoid activity with repeated motion at the elbow. Avoid lifting heavy objects as told by your health care provider. Keep all follow-up visits. This is important. You will need to have your PICC dressing changed at least once a week. Contact a health care provider if: You have pain in your arm, ear, face, or teeth. You have a fever or chills. You have redness, swelling, or pain around the insertion site. You have fluid or blood coming from the insertion site. Your insertion site feels warm to the touch. You have pus or a bad smell coming from the insertion site. Your skin feels hard and raised around the insertion site. Your PICC dressing has gotten wet or is coming off and you have not been taught how to change it. Get help right away if: You have problems with your PICC, such as  your PICC: Was tugged or pulled and has partially come out. Do not  push the PICC back in. Cannot be flushed, is hard to flush, or leaks around the insertion site when it is flushed. Makes a flushing sound when it is flushed. Appears to have a hole or tear. Is accidentally pulled all the way out. If this happens, cover the insertion site with a gauze dressing. Do not throw the PICC away. Your health care provider will need to check it to be sure the entire catheter came out. You feel your heart racing or skipping beats, or you have chest pain. You have shortness of breath or trouble breathing. You have swelling, redness, warmth, or pain in the arm in which the PICC is placed. You have a red streak going up your arm that   starts under the PICC dressing. These symptoms may be an emergency. Get help right away. Call 911. Do not wait to see if the symptoms will go away. Do not drive yourself to the hospital. Summary A peripherally inserted central catheter (PICC) is a long, thin, flexible tube (catheter) that is put into a vein in the arm or leg. If cared for properly, a PICC can remain in place for many months. Having a PICC can allow you to go home from the hospital sooner and continue treatment at home. The PICC is inserted using a germ-free (sterile) technique by a specially trained health care provider. Only a trained health care provider should remove it. Do not have your blood pressure checked on the arm in which your PICC is placed. Always keep your PICC identification card with you. This information is not intended to replace advice given to you by your health care provider. Make sure you discuss any questions you have with your health care provider. Document Revised: 10/27/2020 Document Reviewed: 10/27/2020 Elsevier Patient Education  2023 Else[employer] Inc.  

## 2021-12-09 ENCOUNTER — Inpatient Hospital Stay: Payer: PRIVATE HEALTH INSURANCE

## 2021-12-09 VITALS — BP 106/72 | HR 92 | Temp 98.5°F | Resp 18

## 2021-12-09 DIAGNOSIS — Z5111 Encounter for antineoplastic chemotherapy: Secondary | ICD-10-CM | POA: Diagnosis not present

## 2021-12-09 DIAGNOSIS — C180 Malignant neoplasm of cecum: Secondary | ICD-10-CM

## 2021-12-09 MED ORDER — SODIUM CHLORIDE 0.9% FLUSH
10.0000 mL | Freq: Once | INTRAVENOUS | Status: AC
  Administered 2021-12-09: 10 mL

## 2021-12-09 MED ORDER — HEPARIN SOD (PORK) LOCK FLUSH 100 UNIT/ML IV SOLN: 500.0000 [IU] | Freq: Once | INTRAVENOUS | Status: DC

## 2021-12-09 MED ORDER — HEPARIN SOD (PORK) LOCK FLUSH 100 UNIT/ML IV SOLN
250.0000 [IU] | Freq: Once | INTRAVENOUS | Status: AC
  Administered 2021-12-09: 250 [IU]

## 2021-12-09 NOTE — Patient Instructions (Signed)
PICC Home Care Guide A peripherally inserted central catheter (PICC) is a form of IV access that allows medicines and IV fluids to be quickly put into the blood and spread throughout the body. The PICC is a long, thin, flexible tube (catheter) that is put into a vein in a person's arm or leg. The catheter ends in a large vein just outside the heart called the superior vena cava (SVC). After the PICC is put in, a chest X-ray may be done to make sure that it is in the right place. A PICC may be placed for different reasons, such as: To give medicines and liquid nutrition. To give IV fluids and blood products. To take blood samples often. If there is trouble placing a peripheral intravenous (PIV) catheter. If cared for properly, a PICC can remain in place for many months. Having a PICC can allow you to go home from the hospital sooner and continue treatment at home. Medicines and PICC care can be managed at home by a family member, caregiver, or home health care team. What are the risks? Generally, having a PICC is safe. However, problems may occur, including: A blood clot (thrombus) forming in or at the end of the PICC. A blood clot forming in a vein (deep vein thrombosis) or traveling to the lung (pulmonary embolism). Inflammation of the vein (phlebitis) in which the PICC is placed. Infection at the insertion site or in the blood. Blood infections from central lines, like PICCs, can be serious and often require a hospital stay. PICC malposition, or PICC movement or poor placement. A break or cut in the PICC. Do not use scissors near the PICC. Nerve or tendon irritation or injury during PICC insertion. How to care for your PICC Please follow the specific guidelines provided by your health care provider. Preventing infection You and any caregivers should wash your hands often with soap and water for at least 20 seconds. Wash hands: Before touching the PICC or the infusion device. Before changing a  bandage (dressing). Do not change the dressing unless you have been taught to do so and have shown you are able to change it safely. Flush the PICC as told. Tell your health care provider right away if the PICC is hard to flush or does not flush. Do not use force to flush the PICC. Use clean and germ-free (sterile) supplies only. Keep the supplies in a dry place. Do not reuse needles, syringes, or any other supplies. Reusing supplies can lead to infection. Keep the PICC dressing dry and secure it with tape if the edges stop sticking to your skin. Check your PICC insertion site every day for signs of infection. Check for: Redness, swelling, or pain. Fluid or blood. Warmth. Pus or a bad smell. Preventing other problems Do not use a syringe that is less than 10 mL to flush the PICC. Do not have your blood pressure checked on the arm in which the PICC is placed. Do not ever pull or tug on the PICC. Keep it secured to your arm with tape or a stretch wrap when not in use. Do not take the PICC out yourself. Only a trained health care provider should remove the PICC. Keep pets and children away from your PICC. How to care for your PICC dressing Keep your PICC dressing clean and dry to prevent infection. Do not take baths, swim, or use a hot tub until your health care provider approves. Ask your health care provider if you can take   showers. You may only be allowed to take sponge baths. When you are allowed to shower: Ask your health care provider to teach you how to wrap the PICC. Cover the PICC with clear plastic wrap and tape to keep it dry while showering. Follow instructions from your health care provider about how to take care of your insertion site and dressing. Make sure you: Wash your hands with soap and water for at least 20 seconds before and after you change your dressing. If soap and water are not available, use hand sanitizer. Change your dressing only if taught to do so by your health care  provider. Your PICC dressing needs to be changed if it becomes loose or wet. Leave stitches (sutures), skin glue, or adhesive strips in place. These skin closures may need to stay in place for 2 weeks or longer. If adhesive strip edges start to loosen and curl up, you may trim the loose edges. Do not remove adhesive strips completely unless your health care provider tells you to do that. Follow these instructions at home: Disposal of supplies Throw away any syringes in a disposal container that is meant for sharp items (sharps container). You can buy a sharps container from a pharmacy, or you can make one by using an empty, hard plastic bottle with a lid. Place any used dressings or infusion bags into a plastic bag. Throw that bag in the trash. General instructions  Always carry your PICC identification card or wear a medical alert bracelet. Keep the tube clamped at all times, unless it is being used. Always carry a smooth-edge clamp with you to clamp the PICC if it breaks. Do not use scissors or sharp objects near the tube. You may bend your arm and move it freely. If your PICC is near or at the bend of your elbow, avoid activity with repeated motion at the elbow. Avoid lifting heavy objects as told by your health care provider. Keep all follow-up visits. This is important. You will need to have your PICC dressing changed at least once a week. Contact a health care provider if: You have pain in your arm, ear, face, or teeth. You have a fever or chills. You have redness, swelling, or pain around the insertion site. You have fluid or blood coming from the insertion site. Your insertion site feels warm to the touch. You have pus or a bad smell coming from the insertion site. Your skin feels hard and raised around the insertion site. Your PICC dressing has gotten wet or is coming off and you have not been taught how to change it. Get help right away if: You have problems with your PICC, such as  your PICC: Was tugged or pulled and has partially come out. Do not  push the PICC back in. Cannot be flushed, is hard to flush, or leaks around the insertion site when it is flushed. Makes a flushing sound when it is flushed. Appears to have a hole or tear. Is accidentally pulled all the way out. If this happens, cover the insertion site with a gauze dressing. Do not throw the PICC away. Your health care provider will need to check it to be sure the entire catheter came out. You feel your heart racing or skipping beats, or you have chest pain. You have shortness of breath or trouble breathing. You have swelling, redness, warmth, or pain in the arm in which the PICC is placed. You have a red streak going up your arm that   starts under the PICC dressing. These symptoms may be an emergency. Get help right away. Call 911. Do not wait to see if the symptoms will go away. Do not drive yourself to the hospital. Summary A peripherally inserted central catheter (PICC) is a long, thin, flexible tube (catheter) that is put into a vein in the arm or leg. If cared for properly, a PICC can remain in place for many months. Having a PICC can allow you to go home from the hospital sooner and continue treatment at home. The PICC is inserted using a germ-free (sterile) technique by a specially trained health care provider. Only a trained health care provider should remove it. Do not have your blood pressure checked on the arm in which your PICC is placed. Always keep your PICC identification card with you. This information is not intended to replace advice given to you by your health care provider. Make sure you discuss any questions you have with your health care provider. Document Revised: 10/27/2020 Document Reviewed: 10/27/2020 Elsevier Patient Education  2023 Else[employer] Inc.  

## 2021-12-12 ENCOUNTER — Inpatient Hospital Stay: Payer: PRIVATE HEALTH INSURANCE

## 2021-12-12 NOTE — Patient Instructions (Signed)
PICC Home Care Guide A peripherally inserted central catheter (PICC) is a form of IV access that allows medicines and IV fluids to be quickly put into the blood and spread throughout the body. The PICC is a long, thin, flexible tube (catheter) that is put into a vein in a person's arm or leg. The catheter ends in a large vein just outside the heart called the superior vena cava (SVC). After the PICC is put in, a chest X-ray may be done to make sure that it is in the right place. A PICC may be placed for different reasons, such as: To give medicines and liquid nutrition. To give IV fluids and blood products. To take blood samples often. If there is trouble placing a peripheral intravenous (PIV) catheter. If cared for properly, a PICC can remain in place for many months. Having a PICC can allow you to go home from the hospital sooner and continue treatment at home. Medicines and PICC care can be managed at home by a family member, caregiver, or home health care team. What are the risks? Generally, having a PICC is safe. However, problems may occur, including: A blood clot (thrombus) forming in or at the end of the PICC. A blood clot forming in a vein (deep vein thrombosis) or traveling to the lung (pulmonary embolism). Inflammation of the vein (phlebitis) in which the PICC is placed. Infection at the insertion site or in the blood. Blood infections from central lines, like PICCs, can be serious and often require a hospital stay. PICC malposition, or PICC movement or poor placement. A break or cut in the PICC. Do not use scissors near the PICC. Nerve or tendon irritation or injury during PICC insertion. How to care for your PICC Please follow the specific guidelines provided by your health care provider. Preventing infection You and any caregivers should wash your hands often with soap and water for at least 20 seconds. Wash hands: Before touching the PICC or the infusion device. Before changing a  bandage (dressing). Do not change the dressing unless you have been taught to do so and have shown you are able to change it safely. Flush the PICC as told. Tell your health care provider right away if the PICC is hard to flush or does not flush. Do not use force to flush the PICC. Use clean and germ-free (sterile) supplies only. Keep the supplies in a dry place. Do not reuse needles, syringes, or any other supplies. Reusing supplies can lead to infection. Keep the PICC dressing dry and secure it with tape if the edges stop sticking to your skin. Check your PICC insertion site every day for signs of infection. Check for: Redness, swelling, or pain. Fluid or blood. Warmth. Pus or a bad smell. Preventing other problems Do not use a syringe that is less than 10 mL to flush the PICC. Do not have your blood pressure checked on the arm in which the PICC is placed. Do not ever pull or tug on the PICC. Keep it secured to your arm with tape or a stretch wrap when not in use. Do not take the PICC out yourself. Only a trained health care provider should remove the PICC. Keep pets and children away from your PICC. How to care for your PICC dressing Keep your PICC dressing clean and dry to prevent infection. Do not take baths, swim, or use a hot tub until your health care provider approves. Ask your health care provider if you can take   showers. You may only be allowed to take sponge baths. When you are allowed to shower: Ask your health care provider to teach you how to wrap the PICC. Cover the PICC with clear plastic wrap and tape to keep it dry while showering. Follow instructions from your health care provider about how to take care of your insertion site and dressing. Make sure you: Wash your hands with soap and water for at least 20 seconds before and after you change your dressing. If soap and water are not available, use hand sanitizer. Change your dressing only if taught to do so by your health care  provider. Your PICC dressing needs to be changed if it becomes loose or wet. Leave stitches (sutures), skin glue, or adhesive strips in place. These skin closures may need to stay in place for 2 weeks or longer. If adhesive strip edges start to loosen and curl up, you may trim the loose edges. Do not remove adhesive strips completely unless your health care provider tells you to do that. Follow these instructions at home: Disposal of supplies Throw away any syringes in a disposal container that is meant for sharp items (sharps container). You can buy a sharps container from a pharmacy, or you can make one by using an empty, hard plastic bottle with a lid. Place any used dressings or infusion bags into a plastic bag. Throw that bag in the trash. General instructions  Always carry your PICC identification card or wear a medical alert bracelet. Keep the tube clamped at all times, unless it is being used. Always carry a smooth-edge clamp with you to clamp the PICC if it breaks. Do not use scissors or sharp objects near the tube. You may bend your arm and move it freely. If your PICC is near or at the bend of your elbow, avoid activity with repeated motion at the elbow. Avoid lifting heavy objects as told by your health care provider. Keep all follow-up visits. This is important. You will need to have your PICC dressing changed at least once a week. Contact a health care provider if: You have pain in your arm, ear, face, or teeth. You have a fever or chills. You have redness, swelling, or pain around the insertion site. You have fluid or blood coming from the insertion site. Your insertion site feels warm to the touch. You have pus or a bad smell coming from the insertion site. Your skin feels hard and raised around the insertion site. Your PICC dressing has gotten wet or is coming off and you have not been taught how to change it. Get help right away if: You have problems with your PICC, such as  your PICC: Was tugged or pulled and has partially come out. Do not  push the PICC back in. Cannot be flushed, is hard to flush, or leaks around the insertion site when it is flushed. Makes a flushing sound when it is flushed. Appears to have a hole or tear. Is accidentally pulled all the way out. If this happens, cover the insertion site with a gauze dressing. Do not throw the PICC away. Your health care provider will need to check it to be sure the entire catheter came out. You feel your heart racing or skipping beats, or you have chest pain. You have shortness of breath or trouble breathing. You have swelling, redness, warmth, or pain in the arm in which the PICC is placed. You have a red streak going up your arm that   starts under the PICC dressing. These symptoms may be an emergency. Get help right away. Call 911. Do not wait to see if the symptoms will go away. Do not drive yourself to the hospital. Summary A peripherally inserted central catheter (PICC) is a long, thin, flexible tube (catheter) that is put into a vein in the arm or leg. If cared for properly, a PICC can remain in place for many months. Having a PICC can allow you to go home from the hospital sooner and continue treatment at home. The PICC is inserted using a germ-free (sterile) technique by a specially trained health care provider. Only a trained health care provider should remove it. Do not have your blood pressure checked on the arm in which your PICC is placed. Always keep your PICC identification card with you. This information is not intended to replace advice given to you by your health care provider. Make sure you discuss any questions you have with your health care provider. Document Revised: 10/27/2020 Document Reviewed: 10/27/2020 Elsevier Patient Education  2023 Else[employer] Inc.  

## 2021-12-14 ENCOUNTER — Encounter: Payer: Self-pay | Admitting: Family

## 2021-12-14 ENCOUNTER — Inpatient Hospital Stay (HOSPITAL_BASED_OUTPATIENT_CLINIC_OR_DEPARTMENT_OTHER): Payer: PRIVATE HEALTH INSURANCE | Admitting: Family

## 2021-12-14 ENCOUNTER — Inpatient Hospital Stay: Payer: PRIVATE HEALTH INSURANCE

## 2021-12-14 ENCOUNTER — Other Ambulatory Visit: Payer: Self-pay

## 2021-12-14 VITALS — BP 115/68 | HR 87 | Temp 98.1°F | Resp 17 | Ht 64.0 in | Wt 257.0 lb

## 2021-12-14 DIAGNOSIS — C180 Malignant neoplasm of cecum: Secondary | ICD-10-CM

## 2021-12-14 DIAGNOSIS — Z5111 Encounter for antineoplastic chemotherapy: Secondary | ICD-10-CM | POA: Diagnosis not present

## 2021-12-14 DIAGNOSIS — Z452 Encounter for adjustment and management of vascular access device: Secondary | ICD-10-CM

## 2021-12-14 DIAGNOSIS — C787 Secondary malignant neoplasm of liver and intrahepatic bile duct: Secondary | ICD-10-CM

## 2021-12-14 DIAGNOSIS — D509 Iron deficiency anemia, unspecified: Secondary | ICD-10-CM

## 2021-12-14 LAB — CBC WITH DIFFERENTIAL (CANCER CENTER ONLY)
Abs Immature Granulocytes: 0.01 10*3/uL (ref 0.00–0.07)
Basophils Absolute: 0.1 10*3/uL (ref 0.0–0.1)
Basophils Relative: 1 %
Eosinophils Absolute: 0.2 10*3/uL (ref 0.0–0.5)
Eosinophils Relative: 3 %
HCT: 36.6 % (ref 36.0–46.0)
Hemoglobin: 11.7 g/dL — ABNORMAL LOW (ref 12.0–15.0)
Immature Granulocytes: 0 %
Lymphocytes Relative: 41 %
Lymphs Abs: 2 10*3/uL (ref 0.7–4.0)
MCH: 30.5 pg (ref 26.0–34.0)
MCHC: 32 g/dL (ref 30.0–36.0)
MCV: 95.3 fL (ref 80.0–100.0)
Monocytes Absolute: 0.4 10*3/uL (ref 0.1–1.0)
Monocytes Relative: 9 %
Neutro Abs: 2.3 10*3/uL (ref 1.7–7.7)
Neutrophils Relative %: 46 %
Platelet Count: 225 10*3/uL (ref 150–400)
RBC: 3.84 MIL/uL — ABNORMAL LOW (ref 3.87–5.11)
RDW: 15.9 % — ABNORMAL HIGH (ref 11.5–15.5)
Smear Review: NORMAL
WBC Count: 5 10*3/uL (ref 4.0–10.5)
nRBC: 0 % (ref 0.0–0.2)

## 2021-12-14 LAB — FERRITIN: Ferritin: 332 ng/mL — ABNORMAL HIGH (ref 11–307)

## 2021-12-14 LAB — CMP (CANCER CENTER ONLY)
ALT: 12 U/L (ref 0–44)
AST: 12 U/L — ABNORMAL LOW (ref 15–41)
Albumin: 3.7 g/dL (ref 3.5–5.0)
Alkaline Phosphatase: 70 U/L (ref 38–126)
Anion gap: 9 (ref 5–15)
BUN: 9 mg/dL (ref 6–20)
CO2: 27 mmol/L (ref 22–32)
Calcium: 9.2 mg/dL (ref 8.9–10.3)
Chloride: 106 mmol/L (ref 98–111)
Creatinine: 0.69 mg/dL (ref 0.44–1.00)
GFR, Estimated: >60 mL/min (ref >60)
Glucose, Bld: 111 mg/dL — ABNORMAL HIGH (ref 70–99)
Potassium: 3.9 mmol/L (ref 3.5–5.1)
Sodium: 142 mmol/L (ref 135–145)
Total Bilirubin: 0.3 mg/dL (ref 0.3–1.2)
Total Protein: 6.6 g/dL (ref 6.5–8.1)

## 2021-12-14 LAB — LACTATE DEHYDROGENASE: LDH: 162 U/L (ref 98–192)

## 2021-12-14 LAB — CEA (IN HOUSE-CHCC): CEA (CHCC-In House): 2.77 ng/mL (ref 0.00–5.00)

## 2021-12-14 LAB — RETICULOCYTES
Immature Retic Fract: 13 % (ref 2.3–15.9)
RBC.: 3.85 MIL/uL — ABNORMAL LOW (ref 3.87–5.11)
Retic Count, Absolute: 58.1 10*3/uL (ref 19.0–186.0)
Retic Ct Pct: 1.5 % (ref 0.4–3.1)

## 2021-12-14 LAB — IRON AND IRON BINDING CAPACITY (CC-WL,HP ONLY)
Iron: 49 ug/dL (ref 28–170)
Saturation Ratios: 22 % (ref 10.4–31.8)
TIBC: 228 ug/dL — ABNORMAL LOW (ref 250–450)
UIBC: 179 ug/dL (ref 148–442)

## 2021-12-14 MED ORDER — SODIUM CHLORIDE 0.9 % IV SOLN: INTRAVENOUS | Status: DC

## 2021-12-14 MED ORDER — SODIUM CHLORIDE 0.9 % IV SOLN
2400.0000 mg/m2 | INTRAVENOUS | Status: DC
  Administered 2021-12-14: 5750 mg via INTRAVENOUS
  Filled 2021-12-14: qty 115

## 2021-12-14 MED ORDER — DEXTROSE 5 % IV SOLN: Freq: Once | INTRAVENOUS | Status: DC

## 2021-12-14 MED ORDER — SODIUM CHLORIDE 0.9 % IV SOLN
400.0000 mg/m2 | Freq: Once | INTRAVENOUS | Status: AC
  Administered 2021-12-14: 956 mg via INTRAVENOUS
  Filled 2021-12-14: qty 47.8

## 2021-12-14 MED ORDER — ATROPINE SULFATE 1 MG/ML IV SOLN
0.5000 mg | Freq: Once | INTRAVENOUS | Status: AC | PRN
  Administered 2021-12-14: 0.5 mg via INTRAVENOUS
  Filled 2021-12-14: qty 1

## 2021-12-14 MED ORDER — SODIUM CHLORIDE 0.9 % IV SOLN
10.0000 mg | Freq: Once | INTRAVENOUS | Status: AC
  Administered 2021-12-14: 10 mg via INTRAVENOUS
  Filled 2021-12-14: qty 10

## 2021-12-14 MED ORDER — SODIUM CHLORIDE 0.9 % IV SOLN: Freq: Once | INTRAVENOUS | Status: DC

## 2021-12-14 MED ORDER — SODIUM CHLORIDE 0.9 % IV SOLN
150.0000 mg | Freq: Once | INTRAVENOUS | Status: AC
  Administered 2021-12-14: 150 mg via INTRAVENOUS
  Filled 2021-12-14: qty 150

## 2021-12-14 MED ORDER — PALONOSETRON HCL INJECTION 0.25 MG/5ML
0.2500 mg | Freq: Once | INTRAVENOUS | Status: AC
  Administered 2021-12-14: 0.25 mg via INTRAVENOUS
  Filled 2021-12-14: qty 5

## 2021-12-14 MED ORDER — SODIUM CHLORIDE 0.9 % IV SOLN
165.0000 mg/m2 | Freq: Once | INTRAVENOUS | Status: AC
  Administered 2021-12-14: 400 mg via INTRAVENOUS
  Filled 2021-12-14: qty 10

## 2021-12-14 NOTE — Progress Notes (Signed)
Hematology and Oncology Follow Up Visit  ROSSLYN PASION [id number] 06/28/1965 56 y.o. 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 Maclaren is here today for follow-up and treatment. She is doing well but notes that she has felt a little more fatigued since we last saw her. Iron studies are pending.  No blood loss, bruising or petechiae noted.  No fever, chills, n/v, cough, rash, dizziness, SOB, chest pain, palpitations, abdominal pain or changes in bowel or bladder habits.  No swelling or tenderness in her extremities.  Neuropathy present in hands and feet. More so in the feet.  No falls or syncope to report.  She has been walking some for exercise.  Appetite and hydration are good. Her weight is 257 lbs.   ECOG Performance Status: 1 - Symptomatic but completely ambulatory  Medications:  Allergies as of 12/14/2021   No Known Allergies      Medication List        Accurate as of December 14, 2021  9:55 AM. If you have any questions, ask your nurse or doctor.          dexamethasone 6 MG tablet Commonly known as: DECADRON Take 1 tablet (6 mg total) by mouth See admin instructions. Take 6 mg by mouth daily for 3 days, starting the day after chemotherapy. Take with food.   loperamide 2 MG tablet Commonly known as: Imodium A-D Take 2 at onset of diarrhea, then 1 every 2hrs until 12hr without a BM. May take 2 tab every 4hrs at bedtime. If diarrhea recurs repeat.   ondansetron 8 MG tablet Commonly known as: Zofran Take 1 tablet (8 mg total) by mouth 2 (two) times daily as needed. Start on day 3 after chemotherapy.    prochlorperazine 10 MG tablet Commonly known as: COMPAZINE Take 1 tablet (10 mg total) by mouth every 6 (six) hours as needed (Nausea or vomiting).   rivaroxaban 10 MG Tabs tablet Commonly known as: XARELTO Take 1 tablet (10 mg total) by mouth daily with supper.        Allergies: No Known Allergies  Past Medical History, Surgical history, Social history, and Family History were reviewed and updated.  Review of Systems: All other 10 point review of systems is negative.   Physical Exam:  height is 5' 4"  (1.626 m) and weight is 257 lb (116.6 kg). Her oral temperature is 98.1 F (36.7 C). Her blood pressure is 115/68 and her pulse is 87. Her respiration is 17 and oxygen saturation is 99%.   Wt Readings from Last 3 Encounters:  12/14/21 257 lb (116.6 kg)  11/30/21 262 lb (118.8 kg)  11/02/21 253 lb (114.8 kg)    Ocular: Sclerae unicteric, pupils equal, round and reactive to light Ear-nose-throat: Oropharynx clear, dentition fair Lymphatic: No cervical or supraclavicular adenopathy Lungs no rales or rhonchi, good excursion bilaterally Heart regular rate and rhythm, no murmur appreciated Abd soft, nontender, positive bowel sounds MSK no focal spinal tenderness, no joint edema Neuro: non-focal, well-oriented, appropriate affect Breasts: Deferred   Lab Results  Component Value Date   WBC 5.0 12/14/2021   HGB  11.7 (L) 12/14/2021   HCT 36.6 12/14/2021   MCV 95.3 12/14/2021   PLT 225 12/14/2021   Lab Results  Component Value Date   FERRITIN 304 11/30/2021   IRON 44 11/30/2021   TIBC 220 (L) 11/30/2021   UIBC 176 11/30/2021   IRONPCTSAT 20 11/30/2021   Lab Results  Component Value Date   RETICCTPCT 1.5 12/14/2021   RBC 3.85 (L) 12/14/2021   RBC 3.84 (L) 12/14/2021   No results found for: "KPAFRELGTCHN", "LAMBDASER", "KAPLAMBRATIO" No results found for: "IGGSERUM", "IGA", "IGMSERUM" No results found for: "TOTALPROTELP", "ALBUMINELP", "A1GS", "A2GS", "BETS",  "BETA2SER", "GAMS", "MSPIKE", "SPEI"   Chemistry      Component Value Date/Time   NA 142 12/14/2021 0850   K 3.9 12/14/2021 0850   CL 106 12/14/2021 0850   CO2 27 12/14/2021 0850   BUN 9 12/14/2021 0850   CREATININE 0.69 12/14/2021 0850      Component Value Date/Time   CALCIUM 9.2 12/14/2021 0850   ALKPHOS 70 12/14/2021 0850   AST 12 (L) 12/14/2021 0850   ALT 12 12/14/2021 0850   BILITOT 0.3 12/14/2021 0850       Impression and Plan: Ms. Barga is a very pleasant 56 yo African American female with metastatic 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   We will proceed with treatment today as planned.  CEA and iron studies are pending.  She is scheduled for her repeat CT and MRI on 12/17/2021.  PICC line flush 3 times a week.  Follow-up in 2 weeks.    Lottie Dawson, NP 8/23/20239:55 AM

## 2021-12-14 NOTE — Addendum Note (Signed)
Addended by: [name] on: 12/14/2021 09:34 AM   Modules accepted: Orders

## 2021-12-14 NOTE — Patient Instructions (Signed)
PICC Home Care Guide A peripherally inserted central catheter (PICC) is a form of IV access that allows medicines and IV fluids to be quickly put into the blood and spread throughout the body. The PICC is a long, thin, flexible tube (catheter) that is put into a vein in a person's arm or leg. The catheter ends in a large vein just outside the heart called the superior vena cava (SVC). After the PICC is put in, a chest X-ray may be done to make sure that it is in the right place. A PICC may be placed for different reasons, such as: To give medicines and liquid nutrition. To give IV fluids and blood products. To take blood samples often. If there is trouble placing a peripheral intravenous (PIV) catheter. If cared for properly, a PICC can remain in place for many months. Having a PICC can allow you to go home from the hospital sooner and continue treatment at home. Medicines and PICC care can be managed at home by a family member, caregiver, or home health care team. What are the risks? Generally, having a PICC is safe. However, problems may occur, including: A blood clot (thrombus) forming in or at the end of the PICC. A blood clot forming in a vein (deep vein thrombosis) or traveling to the lung (pulmonary embolism). Inflammation of the vein (phlebitis) in which the PICC is placed. Infection at the insertion site or in the blood. Blood infections from central lines, like PICCs, can be serious and often require a hospital stay. PICC malposition, or PICC movement or poor placement. A break or cut in the PICC. Do not use scissors near the PICC. Nerve or tendon irritation or injury during PICC insertion. How to care for your PICC Please follow the specific guidelines provided by your health care provider. Preventing infection You and any caregivers should wash your hands often with soap and water for at least 20 seconds. Wash hands: Before touching the PICC or the infusion device. Before changing a  bandage (dressing). Do not change the dressing unless you have been taught to do so and have shown you are able to change it safely. Flush the PICC as told. Tell your health care provider right away if the PICC is hard to flush or does not flush. Do not use force to flush the PICC. Use clean and germ-free (sterile) supplies only. Keep the supplies in a dry place. Do not reuse needles, syringes, or any other supplies. Reusing supplies can lead to infection. Keep the PICC dressing dry and secure it with tape if the edges stop sticking to your skin. Check your PICC insertion site every day for signs of infection. Check for: Redness, swelling, or pain. Fluid or blood. Warmth. Pus or a bad smell. Preventing other problems Do not use a syringe that is less than 10 mL to flush the PICC. Do not have your blood pressure checked on the arm in which the PICC is placed. Do not ever pull or tug on the PICC. Keep it secured to your arm with tape or a stretch wrap when not in use. Do not take the PICC out yourself. Only a trained health care provider should remove the PICC. Keep pets and children away from your PICC. How to care for your PICC dressing Keep your PICC dressing clean and dry to prevent infection. Do not take baths, swim, or use a hot tub until your health care provider approves. Ask your health care provider if you can take   showers. You may only be allowed to take sponge baths. When you are allowed to shower: Ask your health care provider to teach you how to wrap the PICC. Cover the PICC with clear plastic wrap and tape to keep it dry while showering. Follow instructions from your health care provider about how to take care of your insertion site and dressing. Make sure you: Wash your hands with soap and water for at least 20 seconds before and after you change your dressing. If soap and water are not available, use hand sanitizer. Change your dressing only if taught to do so by your health care  provider. Your PICC dressing needs to be changed if it becomes loose or wet. Leave stitches (sutures), skin glue, or adhesive strips in place. These skin closures may need to stay in place for 2 weeks or longer. If adhesive strip edges start to loosen and curl up, you may trim the loose edges. Do not remove adhesive strips completely unless your health care provider tells you to do that. Follow these instructions at home: Disposal of supplies Throw away any syringes in a disposal container that is meant for sharp items (sharps container). You can buy a sharps container from a pharmacy, or you can make one by using an empty, hard plastic bottle with a lid. Place any used dressings or infusion bags into a plastic bag. Throw that bag in the trash. General instructions  Always carry your PICC identification card or wear a medical alert bracelet. Keep the tube clamped at all times, unless it is being used. Always carry a smooth-edge clamp with you to clamp the PICC if it breaks. Do not use scissors or sharp objects near the tube. You may bend your arm and move it freely. If your PICC is near or at the bend of your elbow, avoid activity with repeated motion at the elbow. Avoid lifting heavy objects as told by your health care provider. Keep all follow-up visits. This is important. You will need to have your PICC dressing changed at least once a week. Contact a health care provider if: You have pain in your arm, ear, face, or teeth. You have a fever or chills. You have redness, swelling, or pain around the insertion site. You have fluid or blood coming from the insertion site. Your insertion site feels warm to the touch. You have pus or a bad smell coming from the insertion site. Your skin feels hard and raised around the insertion site. Your PICC dressing has gotten wet or is coming off and you have not been taught how to change it. Get help right away if: You have problems with your PICC, such as  your PICC: Was tugged or pulled and has partially come out. Do not  push the PICC back in. Cannot be flushed, is hard to flush, or leaks around the insertion site when it is flushed. Makes a flushing sound when it is flushed. Appears to have a hole or tear. Is accidentally pulled all the way out. If this happens, cover the insertion site with a gauze dressing. Do not throw the PICC away. Your health care provider will need to check it to be sure the entire catheter came out. You feel your heart racing or skipping beats, or you have chest pain. You have shortness of breath or trouble breathing. You have swelling, redness, warmth, or pain in the arm in which the PICC is placed. You have a red streak going up your arm that   starts under the PICC dressing. These symptoms may be an emergency. Get help right away. Call 911. Do not wait to see if the symptoms will go away. Do not drive yourself to the hospital. Summary A peripherally inserted central catheter (PICC) is a long, thin, flexible tube (catheter) that is put into a vein in the arm or leg. If cared for properly, a PICC can remain in place for many months. Having a PICC can allow you to go home from the hospital sooner and continue treatment at home. The PICC is inserted using a germ-free (sterile) technique by a specially trained health care provider. Only a trained health care provider should remove it. Do not have your blood pressure checked on the arm in which your PICC is placed. Always keep your PICC identification card with you. This information is not intended to replace advice given to you by your health care provider. Make sure you discuss any questions you have with your health care provider. Document Revised: 10/27/2020 Document Reviewed: 10/27/2020 Elsevier Patient Education  2023 Else[employer] Inc.  

## 2021-12-16 ENCOUNTER — Inpatient Hospital Stay: Payer: PRIVATE HEALTH INSURANCE

## 2021-12-16 VITALS — BP 108/71 | HR 100 | Temp 98.1°F | Resp 17

## 2021-12-16 DIAGNOSIS — Z5111 Encounter for antineoplastic chemotherapy: Secondary | ICD-10-CM | POA: Diagnosis not present

## 2021-12-16 DIAGNOSIS — C180 Malignant neoplasm of cecum: Secondary | ICD-10-CM

## 2021-12-16 MED ORDER — HEPARIN SOD (PORK) LOCK FLUSH 100 UNIT/ML IV SOLN
250.0000 [IU] | Freq: Once | INTRAVENOUS | Status: AC | PRN
  Administered 2021-12-16: 250 [IU]

## 2021-12-16 MED ORDER — SODIUM CHLORIDE 0.9% FLUSH
10.0000 mL | INTRAVENOUS | Status: DC | PRN
  Administered 2021-12-16: 10 mL

## 2021-12-16 MED ORDER — PEGFILGRASTIM-CBQV 6 MG/0.6ML ~~LOC~~ SOSY
6.0000 mg | PREFILLED_SYRINGE | Freq: Once | SUBCUTANEOUS | Status: AC
  Administered 2021-12-16: 6 mg via SUBCUTANEOUS
  Filled 2021-12-16: qty 0.6

## 2021-12-16 NOTE — Patient Instructions (Signed)

## 2021-12-17 ENCOUNTER — Encounter (HOSPITAL_BASED_OUTPATIENT_CLINIC_OR_DEPARTMENT_OTHER): Payer: Self-pay

## 2021-12-17 ENCOUNTER — Ambulatory Visit (HOSPITAL_BASED_OUTPATIENT_CLINIC_OR_DEPARTMENT_OTHER)
Admission: RE | Admit: 2021-12-17 | Discharge: 2021-12-17 | Disposition: A | Discharge status: Home / Self Care | Payer: PRIVATE HEALTH INSURANCE | Source: Ambulatory Visit | Attending: Family | Admitting: Family

## 2021-12-17 DIAGNOSIS — C180 Malignant neoplasm of cecum: Secondary | ICD-10-CM

## 2021-12-17 DIAGNOSIS — C787 Secondary malignant neoplasm of liver and intrahepatic bile duct: Secondary | ICD-10-CM | POA: Diagnosis present

## 2021-12-17 MED ORDER — IOHEXOL 300 MG/ML  SOLN
75.0000 mL | Freq: Once | INTRAMUSCULAR | Status: AC | PRN
  Administered 2021-12-17: 75 mL via INTRAVENOUS

## 2021-12-17 MED ORDER — GADOBUTROL 1 MMOL/ML IV SOLN
10.0000 mL | Freq: Once | INTRAVENOUS | Status: AC | PRN
  Administered 2021-12-17: 10 mL via INTRAVENOUS

## 2021-12-18 ENCOUNTER — Other Ambulatory Visit: Payer: Self-pay

## 2021-12-19 ENCOUNTER — Telehealth: Payer: Self-pay

## 2021-12-19 ENCOUNTER — Inpatient Hospital Stay: Payer: PRIVATE HEALTH INSURANCE

## 2021-12-19 VITALS — BP 110/81 | HR 81 | Resp 17

## 2021-12-19 DIAGNOSIS — Z452 Encounter for adjustment and management of vascular access device: Secondary | ICD-10-CM

## 2021-12-19 DIAGNOSIS — Z5111 Encounter for antineoplastic chemotherapy: Secondary | ICD-10-CM | POA: Diagnosis not present

## 2021-12-19 MED ORDER — HEPARIN SOD (PORK) LOCK FLUSH 100 UNIT/ML IV SOLN
250.0000 [IU] | Freq: Once | INTRAVENOUS | Status: AC
  Administered 2021-12-19: 250 [IU] via INTRAVENOUS

## 2021-12-19 MED ORDER — SODIUM CHLORIDE 0.9% FLUSH
10.0000 mL | Freq: Once | INTRAVENOUS | Status: AC
  Administered 2021-12-19: 10 mL via INTRAVENOUS

## 2021-12-19 NOTE — Telephone Encounter (Signed)
Pt advised of results while in office for port flush. Pt had no concerns at this time.

## 2021-12-19 NOTE — Telephone Encounter (Signed)
-----   Message from [name], MD sent at 12/19/2021  2:09 PM EDT ----- Please call and let her know that the cancer is stable.  There is no growth.  Everything looks fantastic.  Thanks.  [name]

## 2021-12-20 ENCOUNTER — Other Ambulatory Visit: Payer: Self-pay

## 2021-12-21 ENCOUNTER — Inpatient Hospital Stay: Payer: PRIVATE HEALTH INSURANCE

## 2021-12-21 VITALS — BP 107/82 | HR 75 | Temp 98.0°F | Resp 18

## 2021-12-21 DIAGNOSIS — C180 Malignant neoplasm of cecum: Secondary | ICD-10-CM

## 2021-12-21 DIAGNOSIS — Z5111 Encounter for antineoplastic chemotherapy: Secondary | ICD-10-CM | POA: Diagnosis not present

## 2021-12-21 MED ORDER — HEPARIN SOD (PORK) LOCK FLUSH 100 UNIT/ML IV SOLN
250.0000 [IU] | Freq: Once | INTRAVENOUS | Status: AC
  Administered 2021-12-21: 250 [IU]

## 2021-12-21 MED ORDER — SODIUM CHLORIDE 0.9% FLUSH
10.0000 mL | Freq: Once | INTRAVENOUS | Status: AC
  Administered 2021-12-21: 10 mL

## 2021-12-21 NOTE — Patient Instructions (Signed)
PICC Home Care Guide A peripherally inserted central catheter (PICC) is a form of IV access that allows medicines and IV fluids to be quickly put into the blood and spread throughout the body. The PICC is a long, thin, flexible tube (catheter) that is put into a vein in a person's arm or leg. The catheter ends in a large vein just outside the heart called the superior vena cava (SVC). After the PICC is put in, a chest X-ray may be done to make sure that it is in the right place. A PICC may be placed for different reasons, such as: To give medicines and liquid nutrition. To give IV fluids and blood products. To take blood samples often. If there is trouble placing a peripheral intravenous (PIV) catheter. If cared for properly, a PICC can remain in place for many months. Having a PICC can allow you to go home from the hospital sooner and continue treatment at home. Medicines and PICC care can be managed at home by a family member, caregiver, or home health care team. What are the risks? Generally, having a PICC is safe. However, problems may occur, including: A blood clot (thrombus) forming in or at the end of the PICC. A blood clot forming in a vein (deep vein thrombosis) or traveling to the lung (pulmonary embolism). Inflammation of the vein (phlebitis) in which the PICC is placed. Infection at the insertion site or in the blood. Blood infections from central lines, like PICCs, can be serious and often require a hospital stay. PICC malposition, or PICC movement or poor placement. A break or cut in the PICC. Do not use scissors near the PICC. Nerve or tendon irritation or injury during PICC insertion. How to care for your PICC Please follow the specific guidelines provided by your health care provider. Preventing infection You and any caregivers should wash your hands often with soap and water for at least 20 seconds. Wash hands: Before touching the PICC or the infusion device. Before changing a  bandage (dressing). Do not change the dressing unless you have been taught to do so and have shown you are able to change it safely. Flush the PICC as told. Tell your health care provider right away if the PICC is hard to flush or does not flush. Do not use force to flush the PICC. Use clean and germ-free (sterile) supplies only. Keep the supplies in a dry place. Do not reuse needles, syringes, or any other supplies. Reusing supplies can lead to infection. Keep the PICC dressing dry and secure it with tape if the edges stop sticking to your skin. Check your PICC insertion site every day for signs of infection. Check for: Redness, swelling, or pain. Fluid or blood. Warmth. Pus or a bad smell. Preventing other problems Do not use a syringe that is less than 10 mL to flush the PICC. Do not have your blood pressure checked on the arm in which the PICC is placed. Do not ever pull or tug on the PICC. Keep it secured to your arm with tape or a stretch wrap when not in use. Do not take the PICC out yourself. Only a trained health care provider should remove the PICC. Keep pets and children away from your PICC. How to care for your PICC dressing Keep your PICC dressing clean and dry to prevent infection. Do not take baths, swim, or use a hot tub until your health care provider approves. Ask your health care provider if you can take   showers. You may only be allowed to take sponge baths. When you are allowed to shower: Ask your health care provider to teach you how to wrap the PICC. Cover the PICC with clear plastic wrap and tape to keep it dry while showering. Follow instructions from your health care provider about how to take care of your insertion site and dressing. Make sure you: Wash your hands with soap and water for at least 20 seconds before and after you change your dressing. If soap and water are not available, use hand sanitizer. Change your dressing only if taught to do so by your health care  provider. Your PICC dressing needs to be changed if it becomes loose or wet. Leave stitches (sutures), skin glue, or adhesive strips in place. These skin closures may need to stay in place for 2 weeks or longer. If adhesive strip edges start to loosen and curl up, you may trim the loose edges. Do not remove adhesive strips completely unless your health care provider tells you to do that. Follow these instructions at home: Disposal of supplies Throw away any syringes in a disposal container that is meant for sharp items (sharps container). You can buy a sharps container from a pharmacy, or you can make one by using an empty, hard plastic bottle with a lid. Place any used dressings or infusion bags into a plastic bag. Throw that bag in the trash. General instructions  Always carry your PICC identification card or wear a medical alert bracelet. Keep the tube clamped at all times, unless it is being used. Always carry a smooth-edge clamp with you to clamp the PICC if it breaks. Do not use scissors or sharp objects near the tube. You may bend your arm and move it freely. If your PICC is near or at the bend of your elbow, avoid activity with repeated motion at the elbow. Avoid lifting heavy objects as told by your health care provider. Keep all follow-up visits. This is important. You will need to have your PICC dressing changed at least once a week. Contact a health care provider if: You have pain in your arm, ear, face, or teeth. You have a fever or chills. You have redness, swelling, or pain around the insertion site. You have fluid or blood coming from the insertion site. Your insertion site feels warm to the touch. You have pus or a bad smell coming from the insertion site. Your skin feels hard and raised around the insertion site. Your PICC dressing has gotten wet or is coming off and you have not been taught how to change it. Get help right away if: You have problems with your PICC, such as  your PICC: Was tugged or pulled and has partially come out. Do not  push the PICC back in. Cannot be flushed, is hard to flush, or leaks around the insertion site when it is flushed. Makes a flushing sound when it is flushed. Appears to have a hole or tear. Is accidentally pulled all the way out. If this happens, cover the insertion site with a gauze dressing. Do not throw the PICC away. Your health care provider will need to check it to be sure the entire catheter came out. You feel your heart racing or skipping beats, or you have chest pain. You have shortness of breath or trouble breathing. You have swelling, redness, warmth, or pain in the arm in which the PICC is placed. You have a red streak going up your arm that   starts under the PICC dressing. These symptoms may be an emergency. Get help right away. Call 911. Do not wait to see if the symptoms will go away. Do not drive yourself to the hospital. Summary A peripherally inserted central catheter (PICC) is a long, thin, flexible tube (catheter) that is put into a vein in the arm or leg. If cared for properly, a PICC can remain in place for many months. Having a PICC can allow you to go home from the hospital sooner and continue treatment at home. The PICC is inserted using a germ-free (sterile) technique by a specially trained health care provider. Only a trained health care provider should remove it. Do not have your blood pressure checked on the arm in which your PICC is placed. Always keep your PICC identification card with you. This information is not intended to replace advice given to you by your health care provider. Make sure you discuss any questions you have with your health care provider. Document Revised: 10/27/2020 Document Reviewed: 10/27/2020 Elsevier Patient Education  2023 Else[employer] Inc.  

## 2021-12-23 ENCOUNTER — Inpatient Hospital Stay: Payer: PRIVATE HEALTH INSURANCE | Attending: Hematology & Oncology

## 2021-12-23 DIAGNOSIS — C180 Malignant neoplasm of cecum: Secondary | ICD-10-CM | POA: Insufficient documentation

## 2021-12-23 DIAGNOSIS — Z7901 Long term (current) use of anticoagulants: Secondary | ICD-10-CM | POA: Insufficient documentation

## 2021-12-23 DIAGNOSIS — Z79899 Other long term (current) drug therapy: Secondary | ICD-10-CM | POA: Insufficient documentation

## 2021-12-23 DIAGNOSIS — Z86718 Personal history of other venous thrombosis and embolism: Secondary | ICD-10-CM | POA: Insufficient documentation

## 2021-12-23 DIAGNOSIS — C787 Secondary malignant neoplasm of liver and intrahepatic bile duct: Secondary | ICD-10-CM | POA: Insufficient documentation

## 2021-12-23 NOTE — Patient Instructions (Signed)
PICC Home Care Guide A peripherally inserted central catheter (PICC) is a form of IV access that allows medicines and IV fluids to be quickly put into the blood and spread throughout the body. The PICC is a long, thin, flexible tube (catheter) that is put into a vein in a person's arm or leg. The catheter ends in a large vein just outside the heart called the superior vena cava (SVC). After the PICC is put in, a chest X-ray may be done to make sure that it is in the right place. A PICC may be placed for different reasons, such as: To give medicines and liquid nutrition. To give IV fluids and blood products. To take blood samples often. If there is trouble placing a peripheral intravenous (PIV) catheter. If cared for properly, a PICC can remain in place for many months. Having a PICC can allow you to go home from the hospital sooner and continue treatment at home. Medicines and PICC care can be managed at home by a family member, caregiver, or home health care team. What are the risks? Generally, having a PICC is safe. However, problems may occur, including: A blood clot (thrombus) forming in or at the end of the PICC. A blood clot forming in a vein (deep vein thrombosis) or traveling to the lung (pulmonary embolism). Inflammation of the vein (phlebitis) in which the PICC is placed. Infection at the insertion site or in the blood. Blood infections from central lines, like PICCs, can be serious and often require a hospital stay. PICC malposition, or PICC movement or poor placement. A break or cut in the PICC. Do not use scissors near the PICC. Nerve or tendon irritation or injury during PICC insertion. How to care for your PICC Please follow the specific guidelines provided by your health care provider. Preventing infection You and any caregivers should wash your hands often with soap and water for at least 20 seconds. Wash hands: Before touching the PICC or the infusion device. Before changing a  bandage (dressing). Do not change the dressing unless you have been taught to do so and have shown you are able to change it safely. Flush the PICC as told. Tell your health care provider right away if the PICC is hard to flush or does not flush. Do not use force to flush the PICC. Use clean and germ-free (sterile) supplies only. Keep the supplies in a dry place. Do not reuse needles, syringes, or any other supplies. Reusing supplies can lead to infection. Keep the PICC dressing dry and secure it with tape if the edges stop sticking to your skin. Check your PICC insertion site every day for signs of infection. Check for: Redness, swelling, or pain. Fluid or blood. Warmth. Pus or a bad smell. Preventing other problems Do not use a syringe that is less than 10 mL to flush the PICC. Do not have your blood pressure checked on the arm in which the PICC is placed. Do not ever pull or tug on the PICC. Keep it secured to your arm with tape or a stretch wrap when not in use. Do not take the PICC out yourself. Only a trained health care provider should remove the PICC. Keep pets and children away from your PICC. How to care for your PICC dressing Keep your PICC dressing clean and dry to prevent infection. Do not take baths, swim, or use a hot tub until your health care provider approves. Ask your health care provider if you can take   showers. You may only be allowed to take sponge baths. When you are allowed to shower: Ask your health care provider to teach you how to wrap the PICC. Cover the PICC with clear plastic wrap and tape to keep it dry while showering. Follow instructions from your health care provider about how to take care of your insertion site and dressing. Make sure you: Wash your hands with soap and water for at least 20 seconds before and after you change your dressing. If soap and water are not available, use hand sanitizer. Change your dressing only if taught to do so by your health care  provider. Your PICC dressing needs to be changed if it becomes loose or wet. Leave stitches (sutures), skin glue, or adhesive strips in place. These skin closures may need to stay in place for 2 weeks or longer. If adhesive strip edges start to loosen and curl up, you may trim the loose edges. Do not remove adhesive strips completely unless your health care provider tells you to do that. Follow these instructions at home: Disposal of supplies Throw away any syringes in a disposal container that is meant for sharp items (sharps container). You can buy a sharps container from a pharmacy, or you can make one by using an empty, hard plastic bottle with a lid. Place any used dressings or infusion bags into a plastic bag. Throw that bag in the trash. General instructions  Always carry your PICC identification card or wear a medical alert bracelet. Keep the tube clamped at all times, unless it is being used. Always carry a smooth-edge clamp with you to clamp the PICC if it breaks. Do not use scissors or sharp objects near the tube. You may bend your arm and move it freely. If your PICC is near or at the bend of your elbow, avoid activity with repeated motion at the elbow. Avoid lifting heavy objects as told by your health care provider. Keep all follow-up visits. This is important. You will need to have your PICC dressing changed at least once a week. Contact a health care provider if: You have pain in your arm, ear, face, or teeth. You have a fever or chills. You have redness, swelling, or pain around the insertion site. You have fluid or blood coming from the insertion site. Your insertion site feels warm to the touch. You have pus or a bad smell coming from the insertion site. Your skin feels hard and raised around the insertion site. Your PICC dressing has gotten wet or is coming off and you have not been taught how to change it. Get help right away if: You have problems with your PICC, such as  your PICC: Was tugged or pulled and has partially come out. Do not  push the PICC back in. Cannot be flushed, is hard to flush, or leaks around the insertion site when it is flushed. Makes a flushing sound when it is flushed. Appears to have a hole or tear. Is accidentally pulled all the way out. If this happens, cover the insertion site with a gauze dressing. Do not throw the PICC away. Your health care provider will need to check it to be sure the entire catheter came out. You feel your heart racing or skipping beats, or you have chest pain. You have shortness of breath or trouble breathing. You have swelling, redness, warmth, or pain in the arm in which the PICC is placed. You have a red streak going up your arm that   starts under the PICC dressing. These symptoms may be an emergency. Get help right away. Call 911. Do not wait to see if the symptoms will go away. Do not drive yourself to the hospital. Summary A peripherally inserted central catheter (PICC) is a long, thin, flexible tube (catheter) that is put into a vein in the arm or leg. If cared for properly, a PICC can remain in place for many months. Having a PICC can allow you to go home from the hospital sooner and continue treatment at home. The PICC is inserted using a germ-free (sterile) technique by a specially trained health care provider. Only a trained health care provider should remove it. Do not have your blood pressure checked on the arm in which your PICC is placed. Always keep your PICC identification card with you. This information is not intended to replace advice given to you by your health care provider. Make sure you discuss any questions you have with your health care provider. Document Revised: 10/27/2020 Document Reviewed: 10/27/2020 Elsevier Patient Education  2023 Else[employer] Inc.  

## 2021-12-27 ENCOUNTER — Inpatient Hospital Stay: Payer: PRIVATE HEALTH INSURANCE

## 2021-12-28 ENCOUNTER — Encounter: Payer: Self-pay | Admitting: Family

## 2021-12-28 ENCOUNTER — Other Ambulatory Visit (HOSPITAL_COMMUNITY): Payer: Self-pay

## 2021-12-28 ENCOUNTER — Other Ambulatory Visit: Payer: Self-pay

## 2021-12-28 ENCOUNTER — Inpatient Hospital Stay: Payer: PRIVATE HEALTH INSURANCE

## 2021-12-28 ENCOUNTER — Telehealth: Payer: Self-pay | Admitting: Pharmacy Technician

## 2021-12-28 ENCOUNTER — Inpatient Hospital Stay (HOSPITAL_BASED_OUTPATIENT_CLINIC_OR_DEPARTMENT_OTHER): Payer: PRIVATE HEALTH INSURANCE | Admitting: Hematology & Oncology

## 2021-12-28 ENCOUNTER — Encounter: Payer: Self-pay | Admitting: Hematology & Oncology

## 2021-12-28 ENCOUNTER — Other Ambulatory Visit: Payer: Self-pay | Admitting: *Deleted

## 2021-12-28 ENCOUNTER — Telehealth: Payer: Self-pay

## 2021-12-28 VITALS — BP 126/74 | HR 79 | Temp 98.3°F | Resp 18 | Wt 259.0 lb

## 2021-12-28 DIAGNOSIS — C180 Malignant neoplasm of cecum: Secondary | ICD-10-CM

## 2021-12-28 DIAGNOSIS — Z452 Encounter for adjustment and management of vascular access device: Secondary | ICD-10-CM

## 2021-12-28 DIAGNOSIS — C787 Secondary malignant neoplasm of liver and intrahepatic bile duct: Secondary | ICD-10-CM

## 2021-12-28 DIAGNOSIS — Z86718 Personal history of other venous thrombosis and embolism: Secondary | ICD-10-CM | POA: Diagnosis not present

## 2021-12-28 DIAGNOSIS — Z7901 Long term (current) use of anticoagulants: Secondary | ICD-10-CM | POA: Diagnosis not present

## 2021-12-28 DIAGNOSIS — D509 Iron deficiency anemia, unspecified: Secondary | ICD-10-CM

## 2021-12-28 DIAGNOSIS — I2690 Septic pulmonary embolism without acute cor pulmonale: Secondary | ICD-10-CM

## 2021-12-28 DIAGNOSIS — Z79899 Other long term (current) drug therapy: Secondary | ICD-10-CM | POA: Diagnosis not present

## 2021-12-28 LAB — CBC WITH DIFFERENTIAL (CANCER CENTER ONLY)
Abs Immature Granulocytes: 0.01 10*3/uL (ref 0.00–0.07)
Basophils Absolute: 0.1 10*3/uL (ref 0.0–0.1)
Basophils Relative: 1 %
Eosinophils Absolute: 0.2 10*3/uL (ref 0.0–0.5)
Eosinophils Relative: 3 %
HCT: 36.1 % (ref 36.0–46.0)
Hemoglobin: 11.5 g/dL — ABNORMAL LOW (ref 12.0–15.0)
Immature Granulocytes: 0 %
Lymphocytes Relative: 41 %
Lymphs Abs: 1.9 10*3/uL (ref 0.7–4.0)
MCH: 30.4 pg (ref 26.0–34.0)
MCHC: 31.9 g/dL (ref 30.0–36.0)
MCV: 95.5 fL (ref 80.0–100.0)
Monocytes Absolute: 0.5 10*3/uL (ref 0.1–1.0)
Monocytes Relative: 11 %
Neutro Abs: 2 10*3/uL (ref 1.7–7.7)
Neutrophils Relative %: 44 %
Platelet Count: 237 10*3/uL (ref 150–400)
RBC: 3.78 MIL/uL — ABNORMAL LOW (ref 3.87–5.11)
RDW: 16 % — ABNORMAL HIGH (ref 11.5–15.5)
Smear Review: NORMAL
WBC Count: 4.6 10*3/uL (ref 4.0–10.5)
nRBC: 0 % (ref 0.0–0.2)

## 2021-12-28 LAB — CMP (CANCER CENTER ONLY)
ALT: 11 U/L (ref 0–44)
AST: 13 U/L — ABNORMAL LOW (ref 15–41)
Albumin: 3.7 g/dL (ref 3.5–5.0)
Alkaline Phosphatase: 72 U/L (ref 38–126)
Anion gap: 7 (ref 5–15)
BUN: 12 mg/dL (ref 6–20)
CO2: 30 mmol/L (ref 22–32)
Calcium: 9.2 mg/dL (ref 8.9–10.3)
Chloride: 106 mmol/L (ref 98–111)
Creatinine: 0.71 mg/dL (ref 0.44–1.00)
GFR, Estimated: >60 mL/min (ref >60)
Glucose, Bld: 112 mg/dL — ABNORMAL HIGH (ref 70–99)
Potassium: 3.6 mmol/L (ref 3.5–5.1)
Sodium: 143 mmol/L (ref 135–145)
Total Bilirubin: 0.4 mg/dL (ref 0.3–1.2)
Total Protein: 6.2 g/dL — ABNORMAL LOW (ref 6.5–8.1)

## 2021-12-28 LAB — IRON AND IRON BINDING CAPACITY (CC-WL,HP ONLY)
Iron: 57 ug/dL (ref 28–170)
Saturation Ratios: 25 % (ref 10.4–31.8)
TIBC: 230 ug/dL — ABNORMAL LOW (ref 250–450)
UIBC: 173 ug/dL (ref 148–442)

## 2021-12-28 LAB — FERRITIN: Ferritin: 312 ng/mL — ABNORMAL HIGH (ref 11–307)

## 2021-12-28 LAB — LACTATE DEHYDROGENASE: LDH: 150 U/L (ref 98–192)

## 2021-12-28 LAB — CEA (IN HOUSE-CHCC): CEA (CHCC-In House): <1 ng/mL (ref 0.00–5.00)

## 2021-12-28 MED ORDER — SODIUM CHLORIDE 0.9% FLUSH
10.0000 mL | Freq: Once | INTRAVENOUS | Status: AC
  Administered 2021-12-28: 10 mL via INTRAVENOUS

## 2021-12-28 MED ORDER — DEXAMETHASONE 6 MG PO TABS: 6.0000 mg | ORAL_TABLET | ORAL | 2 refills | Status: DC

## 2021-12-28 MED ORDER — CAPECITABINE 500 MG PO TABS
1250.0000 mg/m2 | ORAL_TABLET | Freq: Two times a day (BID) | ORAL | 5 refills | Status: DC
Start: 2021-12-28 — End: 2021-12-29
  Filled 2021-12-28: qty 168, 14d supply, fill #0

## 2021-12-28 MED ORDER — HEPARIN SOD (PORK) LOCK FLUSH 100 UNIT/ML IV SOLN
500.0000 [IU] | Freq: Once | INTRAVENOUS | Status: AC
  Administered 2021-12-28: 250 [IU] via INTRAVENOUS

## 2021-12-28 NOTE — Telephone Encounter (Signed)
Oral Oncology Patient Advocate Encounter  After completing a benefits investigation, prior authorization for Capecitabine is not required at this time through [place] [employer].  Patient's copay is $0.    Lady Deutscher, CPhT-Adv Oncology Pharmacy Patient [place] Direct Number: [phone]  Fax: [phone]

## 2021-12-28 NOTE — Patient Instructions (Signed)
PICC Home Care Guide A peripherally inserted central catheter (PICC) is a form of IV access that allows medicines and IV fluids to be quickly put into the blood and spread throughout the body. The PICC is a long, thin, flexible tube (catheter) that is put into a vein in a person's arm or leg. The catheter ends in a large vein just outside the heart called the superior vena cava (SVC). After the PICC is put in, a chest X-ray may be done to make sure that it is in the right place. A PICC may be placed for different reasons, such as: To give medicines and liquid nutrition. To give IV fluids and blood products. To take blood samples often. If there is trouble placing a peripheral intravenous (PIV) catheter. If cared for properly, a PICC can remain in place for many months. Having a PICC can allow you to go home from the hospital sooner and continue treatment at home. Medicines and PICC care can be managed at home by a family member, caregiver, or home health care team. What are the risks? Generally, having a PICC is safe. However, problems may occur, including: A blood clot (thrombus) forming in or at the end of the PICC. A blood clot forming in a vein (deep vein thrombosis) or traveling to the lung (pulmonary embolism). Inflammation of the vein (phlebitis) in which the PICC is placed. Infection at the insertion site or in the blood. Blood infections from central lines, like PICCs, can be serious and often require a hospital stay. PICC malposition, or PICC movement or poor placement. A break or cut in the PICC. Do not use scissors near the PICC. Nerve or tendon irritation or injury during PICC insertion. How to care for your PICC Please follow the specific guidelines provided by your health care provider. Preventing infection You and any caregivers should wash your hands often with soap and water for at least 20 seconds. Wash hands: Before touching the PICC or the infusion device. Before changing a  bandage (dressing). Do not change the dressing unless you have been taught to do so and have shown you are able to change it safely. Flush the PICC as told. Tell your health care provider right away if the PICC is hard to flush or does not flush. Do not use force to flush the PICC. Use clean and germ-free (sterile) supplies only. Keep the supplies in a dry place. Do not reuse needles, syringes, or any other supplies. Reusing supplies can lead to infection. Keep the PICC dressing dry and secure it with tape if the edges stop sticking to your skin. Check your PICC insertion site every day for signs of infection. Check for: Redness, swelling, or pain. Fluid or blood. Warmth. Pus or a bad smell. Preventing other problems Do not use a syringe that is less than 10 mL to flush the PICC. Do not have your blood pressure checked on the arm in which the PICC is placed. Do not ever pull or tug on the PICC. Keep it secured to your arm with tape or a stretch wrap when not in use. Do not take the PICC out yourself. Only a trained health care provider should remove the PICC. Keep pets and children away from your PICC. How to care for your PICC dressing Keep your PICC dressing clean and dry to prevent infection. Do not take baths, swim, or use a hot tub until your health care provider approves. Ask your health care provider if you can take   showers. You may only be allowed to take sponge baths. When you are allowed to shower: Ask your health care provider to teach you how to wrap the PICC. Cover the PICC with clear plastic wrap and tape to keep it dry while showering. Follow instructions from your health care provider about how to take care of your insertion site and dressing. Make sure you: Wash your hands with soap and water for at least 20 seconds before and after you change your dressing. If soap and water are not available, use hand sanitizer. Change your dressing only if taught to do so by your health care  provider. Your PICC dressing needs to be changed if it becomes loose or wet. Leave stitches (sutures), skin glue, or adhesive strips in place. These skin closures may need to stay in place for 2 weeks or longer. If adhesive strip edges start to loosen and curl up, you may trim the loose edges. Do not remove adhesive strips completely unless your health care provider tells you to do that. Follow these instructions at home: Disposal of supplies Throw away any syringes in a disposal container that is meant for sharp items (sharps container). You can buy a sharps container from a pharmacy, or you can make one by using an empty, hard plastic bottle with a lid. Place any used dressings or infusion bags into a plastic bag. Throw that bag in the trash. General instructions  Always carry your PICC identification card or wear a medical alert bracelet. Keep the tube clamped at all times, unless it is being used. Always carry a smooth-edge clamp with you to clamp the PICC if it breaks. Do not use scissors or sharp objects near the tube. You may bend your arm and move it freely. If your PICC is near or at the bend of your elbow, avoid activity with repeated motion at the elbow. Avoid lifting heavy objects as told by your health care provider. Keep all follow-up visits. This is important. You will need to have your PICC dressing changed at least once a week. Contact a health care provider if: You have pain in your arm, ear, face, or teeth. You have a fever or chills. You have redness, swelling, or pain around the insertion site. You have fluid or blood coming from the insertion site. Your insertion site feels warm to the touch. You have pus or a bad smell coming from the insertion site. Your skin feels hard and raised around the insertion site. Your PICC dressing has gotten wet or is coming off and you have not been taught how to change it. Get help right away if: You have problems with your PICC, such as  your PICC: Was tugged or pulled and has partially come out. Do not  push the PICC back in. Cannot be flushed, is hard to flush, or leaks around the insertion site when it is flushed. Makes a flushing sound when it is flushed. Appears to have a hole or tear. Is accidentally pulled all the way out. If this happens, cover the insertion site with a gauze dressing. Do not throw the PICC away. Your health care provider will need to check it to be sure the entire catheter came out. You feel your heart racing or skipping beats, or you have chest pain. You have shortness of breath or trouble breathing. You have swelling, redness, warmth, or pain in the arm in which the PICC is placed. You have a red streak going up your arm that   starts under the PICC dressing. These symptoms may be an emergency. Get help right away. Call 911. Do not wait to see if the symptoms will go away. Do not drive yourself to the hospital. Summary A peripherally inserted central catheter (PICC) is a long, thin, flexible tube (catheter) that is put into a vein in the arm or leg. If cared for properly, a PICC can remain in place for many months. Having a PICC can allow you to go home from the hospital sooner and continue treatment at home. The PICC is inserted using a germ-free (sterile) technique by a specially trained health care provider. Only a trained health care provider should remove it. Do not have your blood pressure checked on the arm in which your PICC is placed. Always keep your PICC identification card with you. This information is not intended to replace advice given to you by your health care provider. Make sure you discuss any questions you have with your health care provider. Document Revised: 10/27/2020 Document Reviewed: 10/27/2020 Elsevier Patient Education  2023 Else[employer] Inc.  

## 2021-12-28 NOTE — Patient Instructions (Signed)
PICC Removal, Adult, Care After The following information offers guidance on how to care for yourself after your procedure. Your health care provider may also give you more specific instructions. If you have problems or questions, contact your health care provider. What can I expect after the procedure? After the procedure, it is common to have: Tenderness or soreness. Redness, swelling, or a scab at the place where your PICC was removed (exit site). Follow these instructions at home: For the first 24 hours after the procedure: Keep the bandage (dressing) on your exit site clean and dry. Do not remove your dressing until your health care provider tells you to do so. Do not lift anything heavy or do activities that require great effort until your health care provider says it is okay. You should avoid: Lifting weights. Doing yard work. Doing any physical activity with repetitive arm movement. Watch closely for any signs of an air bubble in the vein (air embolism). This is a rare but serious complication. Signs of an air embolism include trouble breathing, wheezing, chest pain, or a fast pulse. If you have signs of an air embolism, call 911 right away and lie down on your left side to keep the air from moving into your lungs. After 24 hours have passed:  Remove your dressing as told by your health care provider. Wash your hands with soap and water for at least 20 seconds before and after you change your dressing. If soap and water are not available, use hand sanitizer. Return to your normal activities as told by your health care provider. A small scab may develop over the exit site. Do not pick at the scab. When bathing or showering, gently wash the exit site with soap and water. Pat it dry. Watch for signs of infection, such as: A fever or chills. Swollen glands under your arm. More redness, swelling, or soreness around your arm. Blood, fluid, or pus coming from your exit site. Warmth or a  bad smell coming from your exit site. A red streak spreading away from your exit site. General instructions Take over-the-counter and prescription medicines only as told by your health care provider. Do not take any new medicines without checking with your health care provider first. If you were given an antibiotic ointment, apply it as told by your health care provider. Keep all follow-up visits. This is important. Contact a health care provider if: You have a fever or chills. You have swelling at your exit site or swollen glands under your arm. You have signs of infection at your exit site. You have soreness, redness, or swelling in your arm that gets worse. Get help right away if: You have numbness or tingling in your fingers, hand, or arm. Your arm looks blue and feels cold. You have signs of an air embolism, such as trouble breathing, wheezing, chest pain, or a fast pulse. These symptoms may be an emergency. Get medical help right away. Call 911. Do not wait to see if the symptoms will go away. Do not drive yourself to the hospital. Summary After a PICC is removed, it is common to have tenderness or soreness, redness, swelling, or a scab at the exit site. Keep the bandage (dressing) over the exit site clean and dry. Do not remove the dressing until your health care provider tells you to do so. Do not lift anything heavy or do activities that require great effort until your health care provider says it is okay. Watch closely for any signs   of an air bubble (air embolism). If you have signs of an air embolism, call 911 right away and lie down on your left side. This information is not intended to replace advice given to you by your health care provider. Make sure you discuss any questions you have with your health care provider. Document Revised: 10/27/2020 Document Reviewed: 10/27/2020 Elsevier Patient Education  2023 Elsevier Inc.  

## 2021-12-28 NOTE — Progress Notes (Signed)
Pt placed in supine position and told to take a deep breath in. PICC line removed at this time and patient told to exhale as picc was being removed. PICC removed with catheter tip intact and measuring 41 cm. Vaseline dressing placed to site and pressure held for 5 minutes and then occlusive dressing placed. Pt educated that she will have to remain flat for 30 minutes. Pt verbalized understanding and had no further questions.

## 2021-12-28 NOTE — Telephone Encounter (Addendum)
Oral Oncology Pharmacist Encounter  Received new prescription for Xeloda (capecitabine) for the treatment of metastatic cecal cancer, planned duration until disease progression or unacceptable drug toxicity. Planned start date: 01/06/2022.  CMP and CBC from 12/28/2021 assessed, no relevant abnormalities. Prescription dose and frequency assessed, messaged provider about possible reduction in dose due to decreased tolerability of high doses in Amercians.   Current medication list in Epic reviewed, no DDIs with capecitabine identified:  Evaluated chart and there are no patient barriers to medication adherence identified.   Prescription has been e-scribed to the [facility] for benefits analysis and approval.  Oral Oncology Clinic will continue to follow for insurance authorization, copayment issues, initial counseling and start date.  Patient agreed to treatment on 12/28/2021 per MD documentation.  Laray Anger, PharmD PGY-2 Pharmacy Resident Hematology/Oncology [phone]  12/28/2021 3:27 PM

## 2021-12-28 NOTE — Progress Notes (Signed)
Hematology and Oncology Follow Up Visit  Lauren Mcpherson [id number] Sep 16, 1965 56 y.o. 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  She really looks great.  She is done incredibly well with treatment.  We just did a follow-up MRI on her.  The MRI shows that she has small hepatic lesions.  These are stable.  There is no disease that we can see outside of the liver.  She had a CT of the chest done.  The CT scan of the chest showed no acute abnormalities.  There is nothing that looks like thoracic metastasis.  She had a 4 mm right lower lobe lung nodule which was felt to be benign.  I really think that we need to make a change in treatment.  She has had quite a bit of systemic chemotherapy for the metastatic disease.  Think that we should consider her for intrahepatic chemotherapy now.  I will make a referral to Interventional Radiology.  She feels great.  She is on Xarelto for the history of pulmonary embolism.  We are doing well with that.  There is nothing on the CT scan that look like residual pulmonary embolism.    She has had no cough or chest wall pain.  She has had no nausea or vomiting.  She has had no bleeding.  There is been no leg swelling.  She has had no rashes.  Her last CEA level was only 2.8.  Currently, I would say that her performance status is probably ECOG 0.    Medications:  Allergies as of 12/28/2021   No Known Allergies      Medication List        Accurate as of December 28, 2021 12:01 PM. If you have any  questions, ask your nurse or doctor.          STOP taking these medications    loperamide 2 MG tablet Commonly known as: Imodium A-D Stopped by: [name], MD   ondansetron 8 MG tablet Commonly known as: Zofran Stopped by: [name], MD   prochlorperazine 10 MG tablet Commonly known as: COMPAZINE Stopped by: [name], MD       TAKE these medications    dexamethasone 6 MG tablet Commonly known as: DECADRON Take 1 tablet (6 mg total) by mouth See admin instructions. Take 6 mg by mouth daily for 3 days, starting the day after chemotherapy. Take with food.   rivaroxaban 10 MG Tabs tablet Commonly known as: XARELTO Take 1 tablet (10 mg total) by mouth daily with supper.        Allergies: No Known Allergies  Past Medical History, Surgical history, Social history, and Family History were reviewed and updated.  Review of Systems: Review of Systems  Constitutional: Negative.   HENT: Negative.    Eyes: Negative.   Respiratory: Negative.    Cardiovascular: Negative.   Gastrointestinal: Negative.   Genitourinary: Negative.   Musculoskeletal: Negative.   Skin: Negative.   Neurological: Negative.  Endo/Heme/Allergies: Negative.   Psychiatric/Behavioral: Negative.       Physical Exam:  weight is 259 lb (117.5 kg). Her oral temperature is 98.3 F (36.8 C). Her blood pressure is 126/74 and her pulse is 79. Her respiration is 18 and oxygen saturation is 100%.   Wt Readings from Last 3 Encounters:  12/28/21 259 lb (117.5 kg)  12/14/21 257 lb (116.6 kg)  11/30/21 262 lb (118.8 kg)    Physical Exam Vitals reviewed.  HENT:     Head: Normocephalic and atraumatic.  Eyes:     Pupils: Pupils are equal, round, and reactive to light.  Cardiovascular:     Rate and Rhythm: Normal rate and regular rhythm.     Heart sounds: Normal heart sounds.  Pulmonary:     Effort: Pulmonary effort is normal.     Breath sounds: Normal breath sounds.  Abdominal:      General: Bowel sounds are normal.     Palpations: Abdomen is soft.     Comments: Abdominal exam is obese but soft.  She has a laparotomy scar that is well-healed.  There is no fluid wave.  There is no palpable liver or spleen tip.  She has no abdominal mass.  Musculoskeletal:        General: No tenderness or deformity. Normal range of motion.     Cervical back: Normal range of motion.  Lymphadenopathy:     Cervical: No cervical adenopathy.  Skin:    General: Skin is warm and dry.     Findings: No erythema or rash.  Neurological:     Mental Status: She is alert and oriented to person, place, and time.  Psychiatric:        Behavior: Behavior normal.        Thought Content: Thought content normal.        Judgment: Judgment normal.      Lab Results  Component Value Date   WBC 4.6 12/28/2021   HGB 11.5 (L) 12/28/2021   HCT 36.1 12/28/2021   MCV 95.5 12/28/2021   PLT 237 12/28/2021   Lab Results  Component Value Date   FERRITIN 332 (H) 12/14/2021   IRON 49 12/14/2021   TIBC 228 (L) 12/14/2021   UIBC 179 12/14/2021   IRONPCTSAT 22 12/14/2021   Lab Results  Component Value Date   RETICCTPCT 1.5 12/14/2021   RBC 3.78 (L) 12/28/2021   No results found for: "KPAFRELGTCHN", "LAMBDASER", "KAPLAMBRATIO" No results found for: "IGGSERUM", "IGA", "IGMSERUM" No results found for: "TOTALPROTELP", "ALBUMINELP", "A1GS", "A2GS", "BETS", "BETA2SER", "GAMS", "MSPIKE", "SPEI"   Chemistry      Component Value Date/Time   NA 143 12/28/2021 0812   K 3.6 12/28/2021 0812   CL 106 12/28/2021 0812   CO2 30 12/28/2021 0812   BUN 12 12/28/2021 0812   CREATININE 0.71 12/28/2021 0812      Component Value Date/Time   CALCIUM 9.2 12/28/2021 0812   ALKPHOS 72 12/28/2021 0812   AST 13 (L) 12/28/2021 0812   ALT 11 12/28/2021 0812   BILITOT 0.4 12/28/2021 0812       Impression and Plan: Lauren Mcpherson is a very pleasant 56 yo African American female with metastatic adenocarcinoma of the cecum  with KRAS mutation.    Again, I think that we can now try to make a change for her.  Hopefully this will be a little bit easier for her.  I think Xeloda would be a great option for her.  I told her about  Xeloda.  I went over some the side effects with Xeloda.  I told her that even though she has dark skin, that she needs to wear sunscreen when she is outside.  She is aware moisturizers on her hands and feet to help prevent peeling and cracking of the skin.  Also we need to watch out for diarrhea.  I think that the key now is going to be the intrahepatic therapy.  I think that she would be a good candidate for intrahepatic therapy.  Whether or not chemotherapy is used or radioisotope therapy is used I think would be up to Interventional Radiology.  I am spent time talking to her about all this.  I explained why I thought we should make a change.  I really think that we should try to focus on what is in the liver now to try to help further her remission status.  She is in agreement with my recommendations.  We will plan to get her back to see [place] probably in about a month or so.  I would not think that any type of intrahepatic therapy would be accomplished until October.  [name], MD 9/6/202312:01 PM

## 2021-12-29 ENCOUNTER — Other Ambulatory Visit (HOSPITAL_COMMUNITY): Payer: Self-pay

## 2021-12-29 MED ORDER — CAPECITABINE 500 MG PO TABS
1000.0000 mg/m2 | ORAL_TABLET | Freq: Two times a day (BID) | ORAL | 5 refills | Status: DC
  Filled 2021-12-29: qty 140, 14d supply, fill #0
  Filled 2021-12-29: qty 140, 21d supply, fill #0
  Filled 2022-01-17: qty 140, 21d supply, fill #1

## 2021-12-29 NOTE — Telephone Encounter (Signed)
Oral Chemotherapy Pharmacist Encounter  Patient Education  I spoke with patient for overview of new oral chemotherapy medication: Xeloda (capecitabine) for the treatment of metastatic cecal cancer, planned duration until disease progression or unacceptable drug toxicity. Planned start date: 01/06/2022.   Pt is doing well. Counseled patient on administration, dosing, side effects, monitoring, drug-food interactions, safe handling, storage, and disposal. Patient will take 5 tablets ('2500mg'$ ) twice daily for 14 days, then hold for 7 days. Repeat every 21 days.  Side effects include but not limited to: mouth irritation and decreased appetite, Hand/Foot Syndrome, N/V/D, infection and fatigue.    Reviewed with patient importance of keeping a medication schedule and plan for any missed doses.  After discussion with patient there are no patient barriers to medication adherence identified.   Lauren Mcpherson voiced understanding and appreciation. All questions answered. Medication handout provided.  Provided patient with [facility] phone number. Patient knows to call the office with questions or concerns. Oral Chemotherapy Navigation Clinic will continue to follow.  Laray Anger, PharmD PGY-2 Pharmacy Resident Hematology/Oncology [phone]  12/29/2021 3:09 PM

## 2021-12-29 NOTE — Telephone Encounter (Signed)
Oral Chemotherapy Pharmacist Encounter   Patient will have her medication delivered on 01/02/22 by [facility] ahead of her planned start on 01/06/22   Darl Pikes, PharmD, BCPS, BCOP, CPP Hematology/Oncology Clinical Pharmacist [place]/DB/AP [facility] [phone]  12/29/2021 5:19 PM

## 2021-12-30 ENCOUNTER — Inpatient Hospital Stay: Payer: PRIVATE HEALTH INSURANCE

## 2022-01-02 ENCOUNTER — Inpatient Hospital Stay: Payer: PRIVATE HEALTH INSURANCE

## 2022-01-04 ENCOUNTER — Other Ambulatory Visit (HOSPITAL_COMMUNITY): Payer: Self-pay

## 2022-01-11 ENCOUNTER — Ambulatory Visit: Payer: PRIVATE HEALTH INSURANCE

## 2022-01-11 ENCOUNTER — Other Ambulatory Visit: Payer: PRIVATE HEALTH INSURANCE

## 2022-01-11 ENCOUNTER — Inpatient Hospital Stay: Payer: PRIVATE HEALTH INSURANCE | Admitting: Family

## 2022-01-11 ENCOUNTER — Ambulatory Visit
Admission: RE | Admit: 2022-01-11 | Discharge: 2022-01-11 | Disposition: A | Discharge status: Home / Self Care | Payer: PRIVATE HEALTH INSURANCE | Source: Ambulatory Visit | Attending: Hematology & Oncology | Admitting: Hematology & Oncology

## 2022-01-11 ENCOUNTER — Inpatient Hospital Stay: Payer: PRIVATE HEALTH INSURANCE

## 2022-01-11 DIAGNOSIS — C180 Malignant neoplasm of cecum: Secondary | ICD-10-CM

## 2022-01-11 HISTORY — PX: IR RADIOLOGIST EVAL & MGMT: IMG5224

## 2022-01-11 NOTE — Consult Note (Signed)
Chief Complaint: Colon cancer metastatic to the liver  Referring Physician(s): Ennever,Peter R  History of Present Illness: Lauren Mcpherson is a 56 y.o. 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  She presents via virtual telephone clinic visit for evaluation of locoregional liver treatment by the gracious referral of Dr. Marin Olp.  She is status post 13 cycles of FOLOXIRI/Avastin (now off avastin and oxaliplatin due to complications including PE) and now on Xeloda.  She initially presented with multifocal liver masses, now with only 2 discrete small masses remaining in the caudate lobe  and segment IV.  These have been stable since at least March 2023 by MRI.  And prior to that diminishing in size serially since first seen in June 2022.    She overall states she is doing well.  No abdominal pain, nausea, vomiting.   Past Medical History:  Diagnosis Date   Cancer [facility])    Goals of care, counseling/discussion 11/01/2020    Past Surgical History:  Procedure Laterality Date   BIOPSY  10/13/2020   Procedure: BIOPSY;  Surgeon: Ronald Lobo, MD;  Location: [facility] ENDOSCOPY;  Service: Endoscopy;;   CHOLECYSTECTOMY     COLONOSCOPY WITH PROPOFOL N/A 10/13/2020   Procedure: COLONOSCOPY WITH PROPOFOL;  Surgeon: Ronald Lobo, MD;  Location: [facility] ENDOSCOPY;  Service: Endoscopy;  Laterality: N/A;   IR CV LINE INJECTION  11/24/2020   IR IMAGING GUIDED PORT INSERTION  11/08/2020   IR REMOVAL TUN ACCESS W/ PORT W/O FL MOD SED  12/01/2020   IR US GUIDE VASC ACCESS RIGHT  11/29/2021   LAPAROSCOPIC RIGHT COLECTOMY Right 10/15/2020   Procedure: LAPAROSCOPIC RIGHT COLECTOMY, LAPAROSCOPIC LIVER BIOPSY, LAPAROSCOPIC TAP BLOCK;  Surgeon: Greer Pickerel, MD;  Location: [facility] ORS;  Service: General;  Laterality: Right;  90 MINUTES   POLYPECTOMY  10/13/2020   Procedure: POLYPECTOMY;  Surgeon: Ronald Lobo, MD;  Location: [facility] ENDOS[facility];  Service: Endoscopy;;   TONSILLECTOMY       Allergies: Patient has no known allergies.  Medications: Prior to Admission medications   Medication Sig Start Date End Date Taking? Authorizing Provider  capecitabine (XELODA) 500 MG tablet Take 5 tablets (2,500 mg total) by mouth 2 (two) times daily with a meal. Take for 14 days, then hold for 7 days. 12/29/21   [name], MD  dexamethasone (DECADRON) 6 MG tablet Take 1 tablet (6 mg total) by mouth See admin instructions. Take 6 mg by mouth daily for 3 days, starting the day after chemotherapy. Take with food. 12/28/21   [name], MD  rivaroxaban (XARELTO) 10 MG TABS tablet Take 1 tablet (10 mg total) by mouth daily with supper. 07/22/21   Celso Amy, NP  prochlorperazine (COMPAZINE) 10 MG tablet Take 1 tablet (10 mg total) by mouth every 6 (six) hours as needed (Nausea or vomiting). Patient not taking: Reported on 12/14/2021 11/02/20 12/28/21  [name], MD     No family history on file.  Social History   Socioeconomic History   Marital status: Married    Spouse name: Not on file   Number of children: Not on file   Years of education: Not on file   Highest education level: Not on file  Occupational History   Not on file  Tobacco Use   Smoking status: Former    Packs/day: 0.50    Years: 20.00    Total pack years: 10.00    Types: Cigarettes    Quit date: 2009  Years since quitting: 14.7   Smokeless tobacco: Never  Vaping Use   Vaping Use: Never used  Substance and Sexual Activity   Alcohol use: Yes    Comment: occ   Drug use: No   Sexual activity: Yes    Birth control/protection: None  Other Topics Concern   Not on file  Social History Narrative   Not on file   Social Determinants of Health   Financial Resource Strain: Low Risk  (11/30/2021)   Overall Financial Resource Strain (CARDIA)    Difficulty of Paying Living Expenses: Not very hard  Food Insecurity: No Food Insecurity (11/30/2021)   Hunger Vital Sign    Worried About Running Out of  Food in the Last Year: Never true    [place] in the Last Year: Never true  Transportation Needs: Unmet Transportation Needs (11/30/2021)   PRAPARE - Transportation    Lack of Transportation (Medical): Yes    Lack of Transportation (Non-Medical): Yes  Physical Activity: Insufficiently Active (11/30/2021)   Exercise Vital Sign    Days of Exercise per Week: 3 days    Minutes of Exercise per Session: 30 min  Stress: Not on file  Social Connections: Socially Integrated (11/30/2021)   Social Connection and Isolation Panel [NHANES]    Frequency of Communication with Friends and Family: More than three times a week    Frequency of Social Gatherings with Friends and Family: More than three times a week    Attends Religious Services: More than 4 times per year    Active Member of [employer] or Organizations: Yes    Attends Music therapist: More than 4 times per year    Marital Status: Married    ECOG Status: 0 - Asymptomatic  Review of Systems: A 12 point ROS discussed and pertinent positives are indicated in the HPI above.  All other systems are negative.   Vital Signs: There were no vitals taken for this visit.  No physical examination was performed in lieu of virtual telephone clinic visit.   Imaging: Index MRI (10/11/20)  Seg IV mass   Caudate and right sided masses  12/17/21 MRI Abdomen  2.1 cm segment IV mass   Tiny caudate mass   Labs:  CBC: Recent Labs    11/02/21 0820 11/30/21 0820 12/14/21 0850 12/28/21 0812  WBC 6.0 5.5 5.0 4.6  HGB 11.8* 11.5* 11.7* 11.5*  HCT 37.2 35.5* 36.6 36.1  PLT 251 293 225 237    COAGS: No results for input(s): "INR", "APTT" in the last 8760 hours.  BMP: Recent Labs    11/02/21 0820 11/30/21 0820 12/14/21 0850 12/28/21 0812  NA 143 142 142 143  K 3.6 3.5 3.9 3.6  CL 107 107 106 106  CO2 '28 27 27 30  '$ GLUCOSE 105* 110* 111* 112*  BUN '10 9 9 12  '$ CALCIUM 9.0 9.2 9.2 9.2  CREATININE 0.77 0.66 0.69 0.71   GFRNONAA >60 >60 >60 >60    LIVER FUNCTION TESTS: Recent Labs    11/02/21 0820 11/30/21 0820 12/14/21 0850 12/28/21 0812  BILITOT 0.4 0.5 0.3 0.4  AST 17 14* 12* 13*  ALT '18 9 12 11  '$ ALKPHOS 72 51 70 72  PROT 5.8* 5.8* 6.6 6.2*  ALBUMIN 3.8 3.8 3.7 3.7    TUMOR MARKERS:   Assessment and Plan: 56 year old female with history of stage IV cecal adenocarcinoma with metastasis to the liver status post FOLFOXIRI and now on Xeloda.  She  has a 2.1 cm anterior segment IV subcapsular mass with mild enhancement on MRI which has been stable for months.  She has a diminishing caudate lobe mass, now no larger than 0.5 cm, and in a location impossible to treat transarterially or percutaneously.  I believe she would be best suited for microwave ablation of the segment IV mass.  We discussed this procedure and its risks, benefits, and peri-procedural expectations in detail.  She is amenable to proceed.    Plan for CT and ultrasound guided microwave ablation of segment IV hepatic metastatic lesion with general anesthesia at [facility].      Electronically Signed: Suzette Battiest, MD 01/11/2022, 2:02 PM   I spent a total of  40 Minutes  in virtual telephone clinical consultation, greater than 50% of which was counseling/coordinating care for metastatic colon cancer.

## 2022-01-16 ENCOUNTER — Other Ambulatory Visit (HOSPITAL_COMMUNITY): Payer: Self-pay | Admitting: Interventional Radiology

## 2022-01-16 DIAGNOSIS — C787 Secondary malignant neoplasm of liver and intrahepatic bile duct: Secondary | ICD-10-CM

## 2022-01-16 DIAGNOSIS — C180 Malignant neoplasm of cecum: Secondary | ICD-10-CM

## 2022-01-17 ENCOUNTER — Telehealth: Payer: Self-pay

## 2022-01-17 ENCOUNTER — Other Ambulatory Visit (HOSPITAL_COMMUNITY): Payer: Self-pay

## 2022-01-17 ENCOUNTER — Other Ambulatory Visit: Payer: Self-pay

## 2022-01-17 DIAGNOSIS — I2690 Septic pulmonary embolism without acute cor pulmonale: Secondary | ICD-10-CM

## 2022-01-17 DIAGNOSIS — C180 Malignant neoplasm of cecum: Secondary | ICD-10-CM

## 2022-01-17 MED ORDER — RIVAROXABAN 10 MG PO TABS: 10.0000 mg | ORAL_TABLET | Freq: Every day | ORAL | 5 refills | Status: DC

## 2022-01-17 NOTE — Telephone Encounter (Signed)
Received phone call from patient asking if she still needed to take decadron while taking her oral chemotherapy. Reviewed pt question with Dr. Marin Olp and pt does not need to be taking any steroid at this time. Pt educated and verbalized understanding. Pt requesting refill on her xarelto and that was refilled and sent to her pharmacy. Pt had no further questions and aware of her next appointment date and time.

## 2022-01-23 ENCOUNTER — Other Ambulatory Visit (HOSPITAL_COMMUNITY): Payer: Self-pay

## 2022-01-24 ENCOUNTER — Other Ambulatory Visit (HOSPITAL_COMMUNITY): Payer: Self-pay

## 2022-01-25 ENCOUNTER — Encounter: Payer: Self-pay | Admitting: Hematology & Oncology

## 2022-01-25 ENCOUNTER — Inpatient Hospital Stay: Payer: PRIVATE HEALTH INSURANCE | Attending: Hematology & Oncology

## 2022-01-25 ENCOUNTER — Other Ambulatory Visit: Payer: Self-pay

## 2022-01-25 ENCOUNTER — Other Ambulatory Visit: Payer: PRIVATE HEALTH INSURANCE

## 2022-01-25 ENCOUNTER — Inpatient Hospital Stay (HOSPITAL_BASED_OUTPATIENT_CLINIC_OR_DEPARTMENT_OTHER): Payer: PRIVATE HEALTH INSURANCE | Admitting: Hematology & Oncology

## 2022-01-25 VITALS — BP 124/73 | HR 71 | Temp 97.8°F | Resp 18 | Wt 262.0 lb

## 2022-01-25 DIAGNOSIS — C180 Malignant neoplasm of cecum: Secondary | ICD-10-CM | POA: Insufficient documentation

## 2022-01-25 DIAGNOSIS — G629 Polyneuropathy, unspecified: Secondary | ICD-10-CM | POA: Diagnosis not present

## 2022-01-25 DIAGNOSIS — C787 Secondary malignant neoplasm of liver and intrahepatic bile duct: Secondary | ICD-10-CM | POA: Insufficient documentation

## 2022-01-25 DIAGNOSIS — Z86711 Personal history of pulmonary embolism: Secondary | ICD-10-CM | POA: Insufficient documentation

## 2022-01-25 DIAGNOSIS — I2690 Septic pulmonary embolism without acute cor pulmonale: Secondary | ICD-10-CM

## 2022-01-25 DIAGNOSIS — Z86718 Personal history of other venous thrombosis and embolism: Secondary | ICD-10-CM | POA: Diagnosis not present

## 2022-01-25 DIAGNOSIS — Z7901 Long term (current) use of anticoagulants: Secondary | ICD-10-CM | POA: Diagnosis not present

## 2022-01-25 LAB — CBC WITH DIFFERENTIAL (CANCER CENTER ONLY)
Abs Immature Granulocytes: 0 10*3/uL (ref 0.00–0.07)
Basophils Absolute: 0 10*3/uL (ref 0.0–0.1)
Basophils Relative: 1 %
Eosinophils Absolute: 0.2 10*3/uL (ref 0.0–0.5)
Eosinophils Relative: 4 %
HCT: 37.2 % (ref 36.0–46.0)
Hemoglobin: 12 g/dL (ref 12.0–15.0)
Immature Granulocytes: 0 %
Lymphocytes Relative: 40 %
Lymphs Abs: 2.1 10*3/uL (ref 0.7–4.0)
MCH: 30.9 pg (ref 26.0–34.0)
MCHC: 32.3 g/dL (ref 30.0–36.0)
MCV: 95.9 fL (ref 80.0–100.0)
Monocytes Absolute: 0.6 10*3/uL (ref 0.1–1.0)
Monocytes Relative: 11 %
Neutro Abs: 2.3 10*3/uL (ref 1.7–7.7)
Neutrophils Relative %: 44 %
Platelet Count: 263 10*3/uL (ref 150–400)
RBC: 3.88 MIL/uL (ref 3.87–5.11)
RDW: 16.9 % — ABNORMAL HIGH (ref 11.5–15.5)
WBC Count: 5.2 10*3/uL (ref 4.0–10.5)
nRBC: 0 % (ref 0.0–0.2)

## 2022-01-25 LAB — CMP (CANCER CENTER ONLY)
ALT: 7 U/L (ref 0–44)
AST: 12 U/L — ABNORMAL LOW (ref 15–41)
Albumin: 4 g/dL (ref 3.5–5.0)
Alkaline Phosphatase: 73 U/L (ref 38–126)
Anion gap: 6 (ref 5–15)
BUN: 11 mg/dL (ref 6–20)
CO2: 31 mmol/L (ref 22–32)
Calcium: 9.8 mg/dL (ref 8.9–10.3)
Chloride: 105 mmol/L (ref 98–111)
Creatinine: 0.77 mg/dL (ref 0.44–1.00)
GFR, Estimated: >60 mL/min (ref >60)
Glucose, Bld: 116 mg/dL — ABNORMAL HIGH (ref 70–99)
Potassium: 4.1 mmol/L (ref 3.5–5.1)
Sodium: 142 mmol/L (ref 135–145)
Total Bilirubin: 0.4 mg/dL (ref 0.3–1.2)
Total Protein: 6.9 g/dL (ref 6.5–8.1)

## 2022-01-25 LAB — CEA (IN HOUSE-CHCC): CEA (CHCC-In House): 3.7 ng/mL (ref 0.00–5.00)

## 2022-01-25 MED ORDER — RIVAROXABAN 10 MG PO TABS: 10.0000 mg | ORAL_TABLET | Freq: Every day | ORAL | 5 refills | Status: DC

## 2022-01-25 NOTE — Progress Notes (Signed)
Hematology and Oncology Follow Up Visit  Lauren Mcpherson [id number] 01-29-1966 56 y.o. 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  She is doing quite nicely.  She is on Xeloda right now.  We did go ahead and send her off to Interventional Radiology.  They think that she would be a good candidate for intrahepatic therapy.  As such, they will set her up to have treatment on 02/22/2022.  She has had no problems with nausea or vomiting.  She has had no cough or shortness of breath.  She is on Xarelto for the history of pulmonary embolism.  We are keeping her on the Xarelto for right now.  Her last CEA level a month ago was less than 1.  She has had a little bit of tingling in the hands and feet.  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  She has had no bleeding.  She has had no leg swelling.  She has had no rashes.  Overall, I would say performance status is probably ECOG 0.    Medications:  Allergies as of 01/25/2022   No Known Allergies      Medication List        Accurate as of January 25, 2022  1:09 PM. If you have any questions, ask your nurse or doctor.          STOP taking these medications    dexamethasone 6 MG tablet Commonly known as: DECADRON Stopped by: [name], MD   prochlorperazine 10 MG tablet Commonly known as: COMPAZINE Stopped by: [name], MD       TAKE these medications    capecitabine 500 MG tablet Commonly known as: XELODA Take 5 tablets (2,500 mg total) by  mouth 2 (two) times daily with a meal. Take for 14 days, then hold for 7 days.   rivaroxaban 10 MG Tabs tablet Commonly known as: XARELTO Take 1 tablet (10 mg total) by mouth daily with supper.        Allergies: No Known Allergies  Past Medical History, Surgical history, Social history, and Family History were reviewed and updated.  Review of Systems: Review of Systems  Constitutional: Negative.   HENT: Negative.    Eyes: Negative.   Respiratory: Negative.    Cardiovascular: Negative.   Gastrointestinal: Negative.   Genitourinary: Negative.   Musculoskeletal: Negative.   Skin: Negative.   Neurological: Negative.   Endo/Heme/Allergies: Negative.   Psychiatric/Behavioral: Negative.       Physical Exam:  weight is 262 lb (118.8 kg). Her oral temperature is 97.8 F (36.6 C). Her blood pressure is 124/73 and her pulse is 71. Her respiration is 18 and oxygen saturation is 99%.   Wt Readings from Last 3 Encounters:  01/25/22 262 lb (118.8 kg)  12/28/21 259 lb (117.5 kg)  12/14/21 257 lb (116.6 kg)    Physical Exam Vitals reviewed.  HENT:     Head: Normocephalic and atraumatic.  Eyes:     Pupils:  Pupils are equal, round, and reactive to light.  Cardiovascular:     Rate and Rhythm: Normal rate and regular rhythm.     Heart sounds: Normal heart sounds.  Pulmonary:     Effort: Pulmonary effort is normal.     Breath sounds: Normal breath sounds.  Abdominal:     General: Bowel sounds are normal.     Palpations: Abdomen is soft.     Comments: Abdominal exam is obese but soft.  She has a laparotomy scar that is well-healed.  There is no fluid wave.  There is no palpable liver or spleen tip.  She has no abdominal mass.  Musculoskeletal:        General: No tenderness or deformity. Normal range of motion.     Cervical back: Normal range of motion.  Lymphadenopathy:     Cervical: No cervical adenopathy.  Skin:    General: Skin is warm and dry.     Findings: No erythema or  rash.  Neurological:     Mental Status: She is alert and oriented to person, place, and time.  Psychiatric:        Behavior: Behavior normal.        Thought Content: Thought content normal.        Judgment: Judgment normal.     Lab Results  Component Value Date   WBC 5.2 01/25/2022   HGB 12.0 01/25/2022   HCT 37.2 01/25/2022   MCV 95.9 01/25/2022   PLT 263 01/25/2022   Lab Results  Component Value Date   FERRITIN 312 (H) 12/28/2021   IRON 57 12/28/2021   TIBC 230 (L) 12/28/2021   UIBC 173 12/28/2021   IRONPCTSAT 25 12/28/2021   Lab Results  Component Value Date   RETICCTPCT 1.5 12/14/2021   RBC 3.88 01/25/2022   No results found for: "KPAFRELGTCHN", "LAMBDASER", "KAPLAMBRATIO" No results found for: "IGGSERUM", "IGA", "IGMSERUM" No results found for: "TOTALPROTELP", "ALBUMINELP", "A1GS", "A2GS", "BETS", "BETA2SER", "GAMS", "MSPIKE", "SPEI"   Chemistry      Component Value Date/Time   NA 142 01/25/2022 1118   K 4.1 01/25/2022 1118   CL 105 01/25/2022 1118   CO2 31 01/25/2022 1118   BUN 11 01/25/2022 1118   CREATININE 0.77 01/25/2022 1118      Component Value Date/Time   CALCIUM 9.8 01/25/2022 1118   ALKPHOS 73 01/25/2022 1118   AST 12 (L) 01/25/2022 1118   ALT 7 01/25/2022 1118   BILITOT 0.4 01/25/2022 1118       Impression and Plan: Lauren Mcpherson is a very pleasant 56 yo African American female with metastatic 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    Again, I am very happy about the fact that she cannot have intrahepatic therapy.  She will start her next Xeloda dose this Friday.  I told her to take the Xeloda dose and then after that, she will stop.  Hopefully, we will then have her off therapy through the [place] season.  Hopefully, she will to successfully undergo intrahepatic therapy for her residual colon cancer.  She really has come a long ways.  She is done incredibly well.  She is so tough.  She has shown quite a bit of resilience.  She has  had a very strong constitution.  I would like to get her back to see me sometime in November.  We will try to make this a week or so before Thanksgiving.  Hopefully, by then she would have had her intrahepatic protocol done.  [name], MD 10/4/20231:09 PM

## 2022-02-03 ENCOUNTER — Other Ambulatory Visit: Payer: Self-pay | Admitting: Physician Assistant

## 2022-02-03 DIAGNOSIS — Z01818 Encounter for other preprocedural examination: Secondary | ICD-10-CM

## 2022-02-03 MED ORDER — DEXTROSE 5 % IV SOLN: 2.0000 g | Freq: Four times a day (QID) | INTRAVENOUS | Status: DC

## 2022-02-03 NOTE — Progress Notes (Signed)
Left a voicemail on the IR P.A. line requesting orders in epic from surgeon.

## 2022-02-06 ENCOUNTER — Other Ambulatory Visit (HOSPITAL_COMMUNITY): Payer: Self-pay

## 2022-02-09 NOTE — Progress Notes (Addendum)
COVID Vaccine received:  '[]'$  No '[x]'$  Yes  x2 Date of any COVID positive Test in last 90 days: None  PCP - none Oncologist- Burney Gauze, MD Cardiologist - None  Chest x-ray -  CT Chest- 12-19-2021  Epic EKG -  01-22-2021 Epic Stress Test -  ECHO -  Cardiac Cath -   Pacemaker/ICD device     '[x]'$  N/A Spinal Cord Stimulator:'[x]'$  No '[]'$  Yes      (Remind patient to bring remote DOS) Other Implants:   Bowel Prep - NPO after MN  History of Sleep Apnea? '[]'$  No '[]'$  Yes   Sleep Study Date:   CPAP used?- '[x]'$  No '[]'$  Yes  (Instruct to bring their mask & Tubing)  Does the patient monitor blood sugar? '[]'$  No '[]'$  Yes  '[x]'$  N/A  Blood Thinner Instructions:  Xarelto   hold x 2 days, Last Dose: Monday, 02-20-2022  ERAS Protocol Ordered: '[x]'$  No  '[]'$  Yes PRE-SURGERY '[]'$  ENSURE  '[]'$  G2   Comments: Blood consent signed by Patient  Activity level: Patient can climb a flight of stairs without difficulty;  '[x]'$  No CP  '[x]'$  No SOB, can perform all ADLs.  Anesthesia review:  Hx of PE  Patient denies shortness of breath, fever, cough and chest pain at PAT appointment.  Patient verbalized understanding and agreement to the Pre-Surgical Instructions that were given to them at this PAT appointment. Patient was also educated of the need to review these PAT instructions again prior to his/her surgery.I reviewed the appropriate phone numbers to call if they have any and questions or concerns.

## 2022-02-09 NOTE — Patient Instructions (Addendum)
SURGICAL WAITING ROOM VISITATION Patients having surgery or a procedure may have no more than 2 support people in the waiting area - these visitors may rotate in the visitor waiting room.   Children under the age of 39 must have an adult with them who is not the patient. If the patient needs to stay at the hospital during part of their recovery, the visitor guidelines for inpatient rooms apply.  PRE-OP VISITATION  Pre-op nurse will coordinate an appropriate time for 1 support person to accompany the patient in pre-op.  This support person may not rotate.  This visitor will be contacted when the time is appropriate for the visitor to come back in the pre-op area.  Please refer to the [facility] website for the visitor guidelines for Inpatients (after your surgery is over and you are in a regular room).  You are not required to quarantine at this time prior to your surgery. However, you must do this: Hand Hygiene often Do NOT share personal items Notify your provider if you are in close contact with someone who has COVID or you develop fever 100.4 or greater, new onset of sneezing, cough, sore throat, shortness of breath or body aches.   If you received a COVID test during your pre-op visit  it is requested that you wear a mask when out in public, stay away from anyone that may not be feeling well and notify your surgeon if you develop symptoms. If you test positive for Covid or have been in contact with anyone that has tested positive in the last 10 days please notify you surgeon.       Your procedure is scheduled on:  Wednesday  February 22, 2022  Report to [facility] Main Entrance.  Report to admitting at:  07:00   AM  (Per Interventional Radiology)  +++++Call this number if you have any questions or problems the morning of surgery [phone]  Do not eat or drink anything After Midnight the night prior to your surgery/procedure.  Remember - BRUSH YOUR TEETH THE MORNING OF  SURGERY WITH YOUR REGULAR TOOTHPASTE  XARELTO-  Hold X 2 days  Last day: Monday  02-20-2022  Take ONLY these medicines the morning of surgery with A SIP OF WATER:  None   You may not have any metal on your body including hair pins, jewelry, and body piercing  Do not wear make-up, lotions, powders, perfumes  or deodorant  Do not wear nail polish including gel and S&S, artificial / acrylic nails, or any other type of covering on natural nails including finger and toenails. If you have artificial nails, gel coating, etc., that needs to be removed by a nail salon, Please have this removed prior to surgery. Not doing so may mean that your surgery could be cancelled or delayed if the Surgeon or anesthesia staff feels like they are unable to monitor you safely.   Do not shave 48 hours prior to surgery to avoid nicks in your skin which may contribute to postoperative infections.     Contacts, Hearing Aids, dentures or bridgework may not be worn into surgery.   You may bring a small overnight bag with you on the day of surgery, only pack items that are not valuable .[facility] IS NOT RESPONSIBLE   FOR VALUABLES THAT ARE LOST OR STOLEN.   DO NOT [place]. PHARMACY WILL DISPENSE MEDICATIONS LISTED ON YOUR MEDICATION LIST TO YOU DURING YOUR ADMISSION IN  [place]!    Please read over the following fact sheets you were given: IF YOU HAVE QUESTIONS ABOUT YOUR PRE-OP INSTRUCTIONS, PLEASE CALL [phone]  ([place])   [place] - Preparing for Surgery Before surgery, you can play an important role.  Because skin is not sterile, your skin needs to be as free of germs as possible.  You can reduce the number of germs on your skin by washing with CHG (chlorahexidine gluconate) soap before surgery.  CHG is an antiseptic cleaner which kills germs and bonds with the skin to continue killing germs even after washing. Please DO NOT use if you have an allergy to CHG or  antibacterial soaps.  If your skin becomes reddened/irritated stop using the CHG and inform your nurse when you arrive at Short Stay. Do not shave (including legs and underarms) for at least 48 hours prior to the first CHG shower.  You may shave your face/neck.  Please follow these instructions carefully:  1.  Shower with CHG Soap the night before surgery and the  morning of surgery.  2.  If you choose to wash your hair, wash your hair first as usual with your normal  shampoo.  3.  After you shampoo, rinse your hair and body thoroughly to remove the shampoo.                             4.  Use CHG as you would any other liquid soap.  You can apply chg directly to the skin and wash.  Gently with a scrungie or clean washcloth.  5.  Apply the CHG Soap to your body ONLY FROM THE NECK DOWN.   Do not use on face/ open                           Wound or open sores. Avoid contact with eyes, ears mouth and genitals (private parts).                       Wash face,  Genitals (private parts) with your normal soap.             6.  Wash thoroughly, paying special attention to the area where your  surgery  will be performed.  7.  Thoroughly rinse your body with warm water from the neck down.  8.  DO NOT shower/wash with your normal soap after using and rinsing off the CHG Soap.            9.  Pat yourself dry with a clean towel.            10.  Wear clean pajamas.            11.  Place clean sheets on your bed the night of your first shower and do not  sleep with pets.  ON THE DAY OF SURGERY : Do not apply any lotions/deodorants the morning of surgery.  Please wear clean clothes to the hospital/surgery center.    FAILURE TO FOLLOW THESE INSTRUCTIONS MAY RESULT IN THE CANCELLATION OF YOUR SURGERY  PATIENT SIGNATURE_________________________________  NURSE SIGNATURE__________________________________  ________________________________________________________________________        WHAT IS A BLOOD  TRANSFUSION? Blood Transfusion Information  A transfusion is the replacement of blood or some of its parts. Blood is made up of multiple cells which provide different functions. Red blood cells carry oxygen and are  used for blood loss replacement. White blood cells fight against infection. Platelets control bleeding. Plasma helps clot blood. Other blood products are available for specialized needs, such as hemophilia or other clotting disorders. BEFORE THE TRANSFUSION  Who gives blood for transfusions?  Healthy volunteers who are fully evaluated to make sure their blood is safe. This is blood bank blood. Transfusion therapy is the safest it has ever been in the practice of medicine. Before blood is taken from a donor, a complete history is taken to make sure that person has no history of diseases nor engages in risky social behavior (examples are intravenous drug use or sexual activity with multiple partners). The donor's travel history is screened to minimize risk of transmitting infections, such as malaria. The donated blood is tested for signs of infectious diseases, such as HIV and hepatitis. The blood is then tested to be sure it is compatible with you in order to minimize the chance of a transfusion reaction. If you or a relative donates blood, this is often done in anticipation of surgery and is not appropriate for emergency situations. It takes many days to process the donated blood. RISKS AND COMPLICATIONS Although transfusion therapy is very safe and saves many lives, the main dangers of transfusion include:  Getting an infectious disease. Developing a transfusion reaction. This is an allergic reaction to something in the blood you were given. Every precaution is taken to prevent this. The decision to have a blood transfusion has been considered carefully by your caregiver before blood is given. Blood is not given unless the benefits outweigh the risks. AFTER THE TRANSFUSION Right after  receiving a blood transfusion, you will usually feel much better and more energetic. This is especially true if your red blood cells have gotten low (anemic). The transfusion raises the level of the red blood cells which carry oxygen, and this usually causes an energy increase. The nurse administering the transfusion will monitor you carefully for complications. HOME CARE INSTRUCTIONS  No special instructions are needed after a transfusion. You may find your energy is better. Speak with your caregiver about any limitations on activity for underlying diseases you may have. SEEK MEDICAL CARE IF:  Your condition is not improving after your transfusion. You develop redness or irritation at the intravenous (IV) site. SEEK IMMEDIATE MEDICAL CARE IF:  Any of the following symptoms occur over the next 12 hours: Shaking chills. You have a temperature by mouth above 102 F (38.9 C), not controlled by medicine. Chest, back, or muscle pain. People around you feel you are not acting correctly or are confused. Shortness of breath or difficulty breathing. Dizziness and fainting. You get a rash or develop hives. You have a decrease in urine output. Your urine turns a dark color or changes to pink, red, or [place]. Any of the following symptoms occur over the next 10 days: You have a temperature by mouth above 102 F (38.9 C), not controlled by medicine. Shortness of breath. Weakness after normal activity. The white part of the eye turns yellow (jaundice). You have a decrease in the amount of urine or are urinating less often. Your urine turns a dark color or changes to pink, red, or [place]. Document Released: 04/07/2000 Document Revised: 07/03/2011 Document Reviewed: 11/25/2007 [facility] Patient Information 2014 [place], [place].  _______________________________________________________________________

## 2022-02-10 ENCOUNTER — Other Ambulatory Visit: Payer: Self-pay

## 2022-02-10 ENCOUNTER — Encounter (HOSPITAL_COMMUNITY): Payer: Self-pay | Admitting: *Deleted

## 2022-02-10 ENCOUNTER — Encounter (HOSPITAL_COMMUNITY)
Admission: RE | Admit: 2022-02-10 | Discharge: 2022-02-10 | Disposition: A | Discharge status: Home / Self Care | Payer: PRIVATE HEALTH INSURANCE | Source: Ambulatory Visit | Attending: Interventional Radiology | Admitting: Interventional Radiology

## 2022-02-10 DIAGNOSIS — Z01812 Encounter for preprocedural laboratory examination: Secondary | ICD-10-CM | POA: Diagnosis present

## 2022-02-10 DIAGNOSIS — Z01818 Encounter for other preprocedural examination: Secondary | ICD-10-CM

## 2022-02-10 HISTORY — DX: Unspecified osteoarthritis, unspecified site: M19.90

## 2022-02-10 LAB — COMPREHENSIVE METABOLIC PANEL
ALT: 11 U/L (ref 0–44)
AST: 17 U/L (ref 15–41)
Albumin: 3.5 g/dL (ref 3.5–5.0)
Alkaline Phosphatase: 68 U/L (ref 38–126)
Anion gap: 6 (ref 5–15)
BUN: 11 mg/dL (ref 6–20)
CO2: 27 mmol/L (ref 22–32)
Calcium: 9.1 mg/dL (ref 8.9–10.3)
Chloride: 109 mmol/L (ref 98–111)
Creatinine, Ser: 0.69 mg/dL (ref 0.44–1.00)
GFR, Estimated: >60 mL/min (ref >60)
Glucose, Bld: 117 mg/dL — ABNORMAL HIGH (ref 70–99)
Potassium: 3.5 mmol/L (ref 3.5–5.1)
Sodium: 142 mmol/L (ref 135–145)
Total Bilirubin: 0.9 mg/dL (ref 0.3–1.2)
Total Protein: 6.7 g/dL (ref 6.5–8.1)

## 2022-02-10 LAB — CBC
HCT: 38.1 % (ref 36.0–46.0)
Hemoglobin: 12.3 g/dL (ref 12.0–15.0)
MCH: 31.2 pg (ref 26.0–34.0)
MCHC: 32.3 g/dL (ref 30.0–36.0)
MCV: 96.7 fL (ref 80.0–100.0)
Platelets: 233 10*3/uL (ref 150–400)
RBC: 3.94 MIL/uL (ref 3.87–5.11)
RDW: 17.3 % — ABNORMAL HIGH (ref 11.5–15.5)
WBC: 3.5 10*3/uL — ABNORMAL LOW (ref 4.0–10.5)
nRBC: 0 % (ref 0.0–0.2)

## 2022-02-10 LAB — PROTIME-INR
INR: 1.3 — ABNORMAL HIGH (ref 0.8–1.2)
Prothrombin Time: 15.7 seconds — ABNORMAL HIGH (ref 11.4–15.2)

## 2022-02-10 LAB — TYPE AND SCREEN
ABO/RH(D): B POS
Antibody Screen: NEGATIVE

## 2022-02-21 ENCOUNTER — Other Ambulatory Visit (HOSPITAL_COMMUNITY): Payer: Self-pay | Admitting: Student

## 2022-02-21 NOTE — Anesthesia Preprocedure Evaluation (Signed)
Anesthesia Evaluation  Patient identified by MRN, date of birth, ID band Patient awake    Reviewed: Allergy & Precautions, H&P , NPO status , Patient's Chart, lab work & pertinent test results  Airway Mallampati: II  TM Distance: >3 FB Neck ROM: Full    Dental no notable dental hx. (+) Teeth Intact, Dental Advisory Given   Pulmonary neg pulmonary ROS, former smoker,    Pulmonary exam normal breath sounds clear to auscultation       Cardiovascular Exercise Tolerance: Good negative cardio ROS   Rhythm:Regular Rate:Normal     Neuro/Psych negative neurological ROS  negative psych ROS   GI/Hepatic negative GI ROS, Neg liver ROS,   Endo/Other  Morbid obesity  Renal/GU negative Renal ROS  negative genitourinary   Musculoskeletal  (+) Arthritis , Osteoarthritis,    Abdominal   Peds  Hematology negative hematology ROS (+)   Anesthesia Other Findings   Reproductive/Obstetrics negative OB ROS                            Anesthesia Physical Anesthesia Plan  ASA: 3  Anesthesia Plan: General   Post-op Pain Management: Tylenol PO (pre-op)*   Induction: Intravenous  PONV Risk Score and Plan: 4 or greater and Ondansetron, Dexamethasone and Midazolam  Airway Management Planned: Oral ETT  Additional Equipment:   Intra-op Plan:   Post-operative Plan: Extubation in OR  Informed Consent: I have reviewed the patients History and Physical, chart, labs and discussed the procedure including the risks, benefits and alternatives for the proposed anesthesia with the patient or authorized representative who has indicated his/her understanding and acceptance.     Dental advisory given  Plan Discussed with: CRNA  Anesthesia Plan Comments:        Anesthesia Quick Evaluation

## 2022-02-21 NOTE — H&P (Deleted)
  The note originally documented on this encounter has been moved the the encounter in which it belongs.  

## 2022-02-21 NOTE — H&P (Signed)
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 She is followed by and referred to IR by Burney Gauze, oncology. Pt initially presented with multifocal liver masses, but after treatment, now has 2 discrete small masses remaining in the caudate lobe and segment IV that have been stable since March 2023. Pt consulted with Dr. Serafina Royals, [place], 01/11/22 for treatment of liver masses. At that time, Dr. Serafina Royals recommended microwave ablation of the segment IV mass under general anesthesia. Pt agreed at consult to proceed with Dr. Malachi Carl recommendation for microwave ablation.   Pt denies fever, chills, N/V, SOB or CP.  She states she is NPO per order.    Past Medical History:  Diagnosis Date   Arthritis    Cancer [facility])    Goals of care, counseling/discussion 11/01/2020    Past Surgical History:  Procedure Laterality Date   BIOPSY  10/13/2020   Procedure: BIOPSY;  Surgeon: Ronald Lobo, MD;  Location: [facility] ENDOSCOPY;  Service: Endoscopy;;   CHOLECYSTECTOMY     COLONOSCOPY WITH PROPOFOL N/A 10/13/2020   Procedure: COLONOSCOPY WITH PROPOFOL;  Surgeon: Ronald Lobo, MD;  Location: [facility] ENDOSCOPY;  Service: Endoscopy;  Laterality: N/A;   IR CV LINE INJECTION  11/24/2020   IR IMAGING GUIDED PORT INSERTION  11/08/2020   IR RADIOLOGIST EVAL & MGMT  01/11/2022   IR REMOVAL TUN ACCESS W/ PORT W/O FL MOD SED  12/01/2020   IR US GUIDE VASC ACCESS RIGHT  11/29/2021   LAPAROSCOPIC RIGHT COLECTOMY Right 10/15/2020   Procedure: LAPAROSCOPIC RIGHT COLECTOMY, LAPAROSCOPIC LIVER BIOPSY, LAPAROSCOPIC TAP BLOCK;  Surgeon: Greer Pickerel, MD;  Location: [facility] ORS;  Service:  General;  Laterality: Right;  90 MINUTES   POLYPECTOMY  10/13/2020   Procedure: POLYPECTOMY;  Surgeon: Ronald Lobo, MD;  Location: [facility] ENDOSCOPY;  Service: Endoscopy;;   TONSILLECTOMY      Allergies: Patient has no known allergies.  Medications: Prior to Admission medications   Medication Sig Start Date End Date Taking? Authorizing Provider  capecitabine (XELODA) 500 MG tablet Take 5 tablets (2,500 mg total) by mouth 2 (two) times daily with a meal. Take for 14 days, then hold for 7 days. 12/29/21   [name], MD  rivaroxaban (XARELTO) 10 MG TABS tablet Take 1 tablet (10 mg total) by mouth daily with supper. 01/25/22   [name], MD     No family history on file.  Social History   Socioeconomic History   Marital status: Married    Spouse name: Not on file   Number of children: Not on file   Years of education: Not on file   Highest education level: Not on file  Occupational History   Not on file  Tobacco Use   Smoking status: Former    Packs/day: 0.50    Years: 20.00    Total pack years: 10.00    Types: Cigarettes    Quit date: 2009    Years since quitting: 14.8   Smokeless tobacco: Never  Vaping Use   Vaping Use: Never used  Substance and Sexual Activity   Alcohol use: Yes    Comment: occ   Drug use: No   Sexual activity: Yes  Birth control/protection: None  Other Topics Concern   Not on file  Social History Narrative   Not on file   Social Determinants of Health   Financial Resource Strain: Low Risk  (11/30/2021)   Overall Financial Resource Strain (CARDIA)    Difficulty of Paying Living Expenses: Not very hard  Food Insecurity: No Food Insecurity (11/30/2021)   Hunger Vital Sign    Worried About Running Out of Food in the Last Year: Never true    Ran Out of Food in the Last Year: Never true  Transportation Needs: Unmet Transportation Needs (11/30/2021)   PRAPARE - Hydrologist (Medical): Yes    Lack of  Transportation (Non-Medical): Yes  Physical Activity: Insufficiently Active (11/30/2021)   Exercise Vital Sign    Days of Exercise per Week: 3 days    Minutes of Exercise per Session: 30 min  Stress: Not on file  Social Connections: Socially Integrated (11/30/2021)   Social Connection and Isolation Panel [NHANES]    Frequency of Communication with Friends and Family: More than three times a week    Frequency of Social Gatherings with Friends and Family: More than three times a week    Attends Religious Services: More than 4 times per year    Active Member of [employer] or Organizations: Yes    Attends Music therapist: More than 4 times per year    Marital Status: Married    Review of Systems: A 12 point ROS discussed and pertinent positives are indicated in the HPI above.  All other systems are negative.  Review of Systems  All other systems reviewed and are negative.   Vital Signs: BP (!) 127/95 (BP Location: Right Arm)   Pulse (!) 122   Temp (!) 100.8 F (38.2 C) (Oral)   Resp 18   LMP 09/22/2020 (Approximate) Comment: Spouse had a vasectomy  SpO2 100%     Physical Exam Vitals reviewed.  Constitutional:      General: She is not in acute distress.    Appearance: Normal appearance. She is not ill-appearing.  HENT:     Head: Normocephalic and atraumatic.     Mouth/Throat:     Mouth: Mucous membranes are dry.     Pharynx: Oropharynx is clear.  Eyes:     Extraocular Movements: Extraocular movements intact.     Pupils: Pupils are equal, round, and reactive to light.  Cardiovascular:     Rate and Rhythm: Regular rhythm. Tachycardia present.     Pulses: Normal pulses.     Heart sounds: Normal heart sounds. No murmur heard. Pulmonary:     Effort: Pulmonary effort is normal. No respiratory distress.     Breath sounds: Normal breath sounds.  Abdominal:     General: Bowel sounds are normal. There is no distension.     Palpations: Abdomen is soft.     Tenderness:  There is no abdominal tenderness. There is no guarding.  Musculoskeletal:     Right lower leg: No edema.     Left lower leg: No edema.  Skin:    General: Skin is warm and dry.  Neurological:     Mental Status: She is alert and oriented to person, place, and time.  Psychiatric:        Mood and Affect: Mood normal.        Behavior: Behavior normal.        Thought Content: Thought content normal.  Judgment: Judgment normal.     Imaging: No results found.  Labs:  CBC: Recent Labs    12/14/21 0850 12/28/21 0812 01/25/22 1118 02/10/22 0948  WBC 5.0 4.6 5.2 3.5*  HGB 11.7* 11.5* 12.0 12.3  HCT 36.6 36.1 37.2 38.1  PLT 225 237 263 233    COAGS: Recent Labs    02/10/22 0948  INR 1.3*    BMP: Recent Labs    12/14/21 0850 12/28/21 0812 01/25/22 1118 02/10/22 0948  NA 142 143 142 142  K 3.9 3.6 4.1 3.5  CL 106 106 105 109  CO2 '27 30 31 27  '$ GLUCOSE 111* 112* 116* 117*  BUN '9 12 11 11  '$ CALCIUM 9.2 9.2 9.8 9.1  CREATININE 0.69 0.71 0.77 0.69  GFRNONAA >60 >60 >60 >60    LIVER FUNCTION TESTS: Recent Labs    12/14/21 0850 12/28/21 0812 01/25/22 1118 02/10/22 0948  BILITOT 0.3 0.4 0.4 0.9  AST 12* 13* 12* 17  ALT '12 11 7 11  '$ ALKPHOS 70 72 73 68  PROT 6.6 6.2* 6.9 6.7  ALBUMIN 3.7 3.7 4.0 3.5    TUMOR MARKERS: No results for input(s): "AFPTM", "CEA", "CA199", "CHROMGRNA" in the last 8760 hours.  Assessment and Plan:  56 yo female with stage IV cecal adenocarcinoma with lever metastasis presents to IR for microwave ablation of liver mass under general anesthesia.   Pt is resting on stretcher.  She is A&O, calm and pleasant.  She is in no distress.  Risks and benefits discussed with the patient including, but not limited to bleeding, infection, liver failure, bile duct injury, pneumothorax or damage to adjacent structures.  All of the patient's questions were answered, patient is agreeable to proceed. Consent signed and in chart.   Thank you  for this interesting consult.  I greatly enjoyed meeting Lauren Mcpherson and look forward to participating in their care.  A copy of this report was sent to the requesting provider on this date.  Electronically Signed: Tyson Alias, NP 02/22/2022, 8:05 AM   I spent a total of 20 minutes in face to face in clinical consultation, greater than 50% of which was counseling/coordinating care for cecal adenocarcinoma with metastasis to liver.

## 2022-02-22 ENCOUNTER — Ambulatory Visit (HOSPITAL_BASED_OUTPATIENT_CLINIC_OR_DEPARTMENT_OTHER): Payer: PRIVATE HEALTH INSURANCE | Admitting: Certified Registered Nurse Anesthetist

## 2022-02-22 ENCOUNTER — Ambulatory Visit (HOSPITAL_COMMUNITY)
Admission: RE | Admit: 2022-02-22 | Discharge: 2022-02-22 | Disposition: A | Discharge status: Home / Self Care | Payer: PRIVATE HEALTH INSURANCE | Source: Ambulatory Visit | Attending: Interventional Radiology | Admitting: Interventional Radiology

## 2022-02-22 ENCOUNTER — Other Ambulatory Visit: Payer: Self-pay

## 2022-02-22 ENCOUNTER — Ambulatory Visit (HOSPITAL_COMMUNITY): Payer: PRIVATE HEALTH INSURANCE | Admitting: Certified Registered Nurse Anesthetist

## 2022-02-22 ENCOUNTER — Encounter (HOSPITAL_COMMUNITY): Payer: Self-pay

## 2022-02-22 ENCOUNTER — Encounter (HOSPITAL_COMMUNITY): Payer: Self-pay | Admitting: Interventional Radiology

## 2022-02-22 ENCOUNTER — Encounter (HOSPITAL_COMMUNITY)
Admission: RE | Disposition: A | Discharge status: Home / Self Care | Payer: Self-pay | Source: Ambulatory Visit | Attending: Interventional Radiology

## 2022-02-22 DIAGNOSIS — Z6841 Body Mass Index (BMI) 40.0 and over, adult: Secondary | ICD-10-CM | POA: Insufficient documentation

## 2022-02-22 DIAGNOSIS — Z87891 Personal history of nicotine dependence: Secondary | ICD-10-CM | POA: Diagnosis not present

## 2022-02-22 DIAGNOSIS — C787 Secondary malignant neoplasm of liver and intrahepatic bile duct: Secondary | ICD-10-CM | POA: Diagnosis not present

## 2022-02-22 DIAGNOSIS — M1990 Unspecified osteoarthritis, unspecified site: Secondary | ICD-10-CM | POA: Diagnosis not present

## 2022-02-22 DIAGNOSIS — R160 Hepatomegaly, not elsewhere classified: Secondary | ICD-10-CM | POA: Diagnosis not present

## 2022-02-22 DIAGNOSIS — C180 Malignant neoplasm of cecum: Secondary | ICD-10-CM

## 2022-02-22 DIAGNOSIS — K769 Liver disease, unspecified: Secondary | ICD-10-CM

## 2022-02-22 HISTORY — PX: RADIOLOGY WITH ANESTHESIA: SHX6223

## 2022-02-22 SURGERY — CT WITH ANESTHESIA
Anesthesia: General

## 2022-02-22 MED ORDER — PROPOFOL 10 MG/ML IV BOLUS
INTRAVENOUS | Status: DC | PRN
  Administered 2022-02-22: 120 mg via INTRAVENOUS

## 2022-02-22 MED ORDER — LIDOCAINE 2% (20 MG/ML) 5 ML SYRINGE
INTRAMUSCULAR | Status: DC | PRN
  Administered 2022-02-22: 60 mg via INTRAVENOUS

## 2022-02-22 MED ORDER — SODIUM CHLORIDE 0.9 % IV SOLN
2.0000 g | Freq: Once | INTRAVENOUS | Status: AC
  Administered 2022-02-22: 2 g via INTRAVENOUS
  Filled 2022-02-22: qty 2

## 2022-02-22 MED ORDER — FENTANYL CITRATE PF 50 MCG/ML IJ SOSY: 25.0000 ug | PREFILLED_SYRINGE | INTRAMUSCULAR | Status: DC | PRN

## 2022-02-22 MED ORDER — HYDROCODONE-ACETAMINOPHEN 5-325 MG PO TABS: 1.0000 | ORAL_TABLET | ORAL | Status: DC | PRN

## 2022-02-22 MED ORDER — FENTANYL CITRATE (PF) 100 MCG/2ML IJ SOLN
INTRAMUSCULAR | Status: DC | PRN
  Administered 2022-02-22 (×3): 50 ug via INTRAVENOUS

## 2022-02-22 MED ORDER — SUGAMMADEX SODIUM 200 MG/2ML IV SOLN
INTRAVENOUS | Status: DC | PRN
  Administered 2022-02-22: 300 mg via INTRAVENOUS

## 2022-02-22 MED ORDER — ORAL CARE MOUTH RINSE: 15.0000 mL | Freq: Once | OROMUCOSAL | Status: AC

## 2022-02-22 MED ORDER — LACTATED RINGERS IV SOLN: INTRAVENOUS | Status: DC

## 2022-02-22 MED ORDER — MIDAZOLAM HCL 5 MG/5ML IJ SOLN
INTRAMUSCULAR | Status: DC | PRN
  Administered 2022-02-22: 2 mg via INTRAVENOUS

## 2022-02-22 MED ORDER — ROCURONIUM BROMIDE 10 MG/ML (PF) SYRINGE
PREFILLED_SYRINGE | INTRAVENOUS | Status: DC | PRN
  Administered 2022-02-22: 10 mg via INTRAVENOUS
  Administered 2022-02-22: 20 mg via INTRAVENOUS
  Administered 2022-02-22: 60 mg via INTRAVENOUS

## 2022-02-22 MED ORDER — ACETAMINOPHEN 500 MG PO TABS
1000.0000 mg | ORAL_TABLET | Freq: Once | ORAL | Status: AC
  Administered 2022-02-22: 1000 mg via ORAL
  Filled 2022-02-22: qty 2

## 2022-02-22 MED ORDER — CHLORHEXIDINE GLUCONATE 0.12 % MT SOLN
15.0000 mL | Freq: Once | OROMUCOSAL | Status: AC
  Administered 2022-02-22: 15 mL via OROMUCOSAL

## 2022-02-22 MED ORDER — PHENYLEPHRINE HCL-NACL 20-0.9 MG/250ML-% IV SOLN
INTRAVENOUS | Status: DC | PRN
  Administered 2022-02-22: 40 ug/min via INTRAVENOUS

## 2022-02-22 MED ORDER — MIDAZOLAM HCL 2 MG/2ML IJ SOLN
INTRAMUSCULAR | Status: AC
  Filled 2022-02-22: qty 2

## 2022-02-22 MED ORDER — PHENYLEPHRINE 80 MCG/ML (10ML) SYRINGE FOR IV PUSH (FOR BLOOD PRESSURE SUPPORT)
PREFILLED_SYRINGE | INTRAVENOUS | Status: DC | PRN
  Administered 2022-02-22 (×2): 80 ug via INTRAVENOUS
  Administered 2022-02-22 (×3): 160 ug via INTRAVENOUS

## 2022-02-22 MED ORDER — DEXAMETHASONE SODIUM PHOSPHATE 10 MG/ML IJ SOLN
INTRAMUSCULAR | Status: DC | PRN
  Administered 2022-02-22: 4 mg via INTRAVENOUS

## 2022-02-22 MED ORDER — ONDANSETRON HCL 4 MG/2ML IJ SOLN
INTRAMUSCULAR | Status: DC | PRN
  Administered 2022-02-22: 4 mg via INTRAVENOUS

## 2022-02-22 MED ORDER — FENTANYL CITRATE (PF) 250 MCG/5ML IJ SOLN
INTRAMUSCULAR | Status: AC
  Filled 2022-02-22: qty 5

## 2022-02-22 NOTE — Progress Notes (Signed)
Spoke with Dr.Suttle for discontinue foley order, drained 54m of yellow urine and removed foley at 11:45am.

## 2022-02-22 NOTE — Procedures (Signed)
Interventional Radiology Procedure Note  Procedure: CT and Ultrasound guided microwave ablation of liver mass  Findings: Please refer to procedural dictation for full description. Right subcapsular liver mass MWA.  Complications: None immediate  Estimated Blood Loss: < 5 mL  Recommendations: PRN pain control. Observe for 4 hours post-ablation. Discharge home today. Follow up in IR clinic in 1 month with MRI abdomen.   Ruthann Cancer, MD Pager: [phone]

## 2022-02-22 NOTE — Anesthesia Postprocedure Evaluation (Signed)
Anesthesia Post Note  Patient: Lauren Mcpherson  Procedure(s) Performed: CT WITH ANESTHESIA MICROWAVE ABLATION     Patient location during evaluation: PACU Anesthesia Type: General Level of consciousness: awake and alert Pain management: pain level controlled Vital Signs Assessment: post-procedure vital signs reviewed and stable Respiratory status: spontaneous breathing, nonlabored ventilation and respiratory function stable Cardiovascular status: blood pressure returned to baseline and stable Postop Assessment: no apparent nausea or vomiting Anesthetic complications: no   No notable events documented.  Last Vitals:  Vitals:   02/22/22 1132 02/22/22 1145  BP: 101/62 107/72  Pulse: 74 77  Resp: 19 (!) 21  Temp:    SpO2: 99% 96%    Last Pain:  Vitals:   02/22/22 1145  TempSrc:   PainSc: 0-No pain                 Emmalyne Giacomo,W. EDMOND

## 2022-02-22 NOTE — Transfer of Care (Signed)
Immediate Anesthesia Transfer of Care Note  Patient: Lauren Mcpherson  Procedure(s) Performed: CT WITH ANESTHESIA MICROWAVE ABLATION  Patient Location: PACU  Anesthesia Type:General  Level of Consciousness: drowsy and patient cooperative  Airway & Oxygen Therapy: Patient Spontanous Breathing and Patient connected to face mask oxygen  Post-op Assessment: Report given to RN and Post -op Vital signs reviewed and stable  Post vital signs: Reviewed and stable  Last Vitals:  Vitals Value Taken Time  BP 116/77 02/22/22 1106  Temp    Pulse 81 02/22/22 1108  Resp 15 02/22/22 1108  SpO2 98 % 02/22/22 1108  Vitals shown include unvalidated device data.  Last Pain:  Vitals:   02/22/22 0744  TempSrc: Oral         Complications: No notable events documented.

## 2022-02-22 NOTE — Anesthesia Procedure Notes (Signed)
Procedure Name: Intubation Date/Time: 02/22/2022 8:57 AM  Performed by: Montel Clock, CRNAPre-anesthesia Checklist: Patient identified, Emergency Drugs available, Suction available, Patient being monitored and Timeout performed Patient Re-evaluated:Patient Re-evaluated prior to induction Oxygen Delivery Method: Circle system utilized Preoxygenation: Pre-oxygenation with 100% oxygen Induction Type: IV induction Ventilation: Mask ventilation without difficulty and Oral airway inserted - appropriate to patient size Laryngoscope Size: Mac and 3 Grade View: Grade I Tube type: Oral Tube size: 7.0 mm Number of attempts: 1 Airway Equipment and Method: Stylet Placement Confirmation: ETT inserted through vocal cords under direct vision, positive ETCO2 and breath sounds checked- equal and bilateral Secured at: 21 cm Tube secured with: Tape Dental Injury: Teeth and Oropharynx as per pre-operative assessment

## 2022-02-23 ENCOUNTER — Encounter (HOSPITAL_COMMUNITY): Payer: Self-pay | Admitting: Interventional Radiology

## 2022-03-06 ENCOUNTER — Other Ambulatory Visit (HOSPITAL_COMMUNITY): Payer: Self-pay

## 2022-03-08 ENCOUNTER — Encounter: Payer: Self-pay | Admitting: Hematology & Oncology

## 2022-03-08 ENCOUNTER — Inpatient Hospital Stay: Payer: PRIVATE HEALTH INSURANCE | Attending: Hematology & Oncology

## 2022-03-08 ENCOUNTER — Inpatient Hospital Stay (HOSPITAL_BASED_OUTPATIENT_CLINIC_OR_DEPARTMENT_OTHER): Payer: PRIVATE HEALTH INSURANCE | Admitting: Hematology & Oncology

## 2022-03-08 ENCOUNTER — Other Ambulatory Visit (HOSPITAL_COMMUNITY): Payer: Self-pay

## 2022-03-08 VITALS — BP 121/70 | HR 76 | Temp 97.9°F | Resp 20 | Ht 64.0 in | Wt 264.0 lb

## 2022-03-08 DIAGNOSIS — C787 Secondary malignant neoplasm of liver and intrahepatic bile duct: Secondary | ICD-10-CM | POA: Insufficient documentation

## 2022-03-08 DIAGNOSIS — Z86711 Personal history of pulmonary embolism: Secondary | ICD-10-CM | POA: Insufficient documentation

## 2022-03-08 DIAGNOSIS — Z7901 Long term (current) use of anticoagulants: Secondary | ICD-10-CM | POA: Diagnosis not present

## 2022-03-08 DIAGNOSIS — C180 Malignant neoplasm of cecum: Secondary | ICD-10-CM | POA: Insufficient documentation

## 2022-03-08 DIAGNOSIS — Z86718 Personal history of other venous thrombosis and embolism: Secondary | ICD-10-CM | POA: Diagnosis not present

## 2022-03-08 LAB — CBC WITH DIFFERENTIAL (CANCER CENTER ONLY)
Abs Immature Granulocytes: 0.02 10*3/uL (ref 0.00–0.07)
Basophils Absolute: 0 10*3/uL (ref 0.0–0.1)
Basophils Relative: 1 %
Eosinophils Absolute: 0.3 10*3/uL (ref 0.0–0.5)
Eosinophils Relative: 4 %
HCT: 37.4 % (ref 36.0–46.0)
Hemoglobin: 12 g/dL (ref 12.0–15.0)
Immature Granulocytes: 0 %
Lymphocytes Relative: 41 %
Lymphs Abs: 2.5 10*3/uL (ref 0.7–4.0)
MCH: 30.8 pg (ref 26.0–34.0)
MCHC: 32.1 g/dL (ref 30.0–36.0)
MCV: 95.9 fL (ref 80.0–100.0)
Monocytes Absolute: 0.7 10*3/uL (ref 0.1–1.0)
Monocytes Relative: 11 %
Neutro Abs: 2.7 10*3/uL (ref 1.7–7.7)
Neutrophils Relative %: 43 %
Platelet Count: 281 10*3/uL (ref 150–400)
RBC: 3.9 MIL/uL (ref 3.87–5.11)
RDW: 17.5 % — ABNORMAL HIGH (ref 11.5–15.5)
WBC Count: 6.2 10*3/uL (ref 4.0–10.5)
nRBC: 0 % (ref 0.0–0.2)

## 2022-03-08 LAB — CMP (CANCER CENTER ONLY)
ALT: 9 U/L (ref 0–44)
AST: 15 U/L (ref 15–41)
Albumin: 3.8 g/dL (ref 3.5–5.0)
Alkaline Phosphatase: 86 U/L (ref 38–126)
Anion gap: 9 (ref 5–15)
BUN: 13 mg/dL (ref 6–20)
CO2: 29 mmol/L (ref 22–32)
Calcium: 9.7 mg/dL (ref 8.9–10.3)
Chloride: 106 mmol/L (ref 98–111)
Creatinine: 0.81 mg/dL (ref 0.44–1.00)
GFR, Estimated: >60 mL/min (ref >60)
Glucose, Bld: 111 mg/dL — ABNORMAL HIGH (ref 70–99)
Potassium: 4.4 mmol/L (ref 3.5–5.1)
Sodium: 144 mmol/L (ref 135–145)
Total Bilirubin: 0.4 mg/dL (ref 0.3–1.2)
Total Protein: 6.9 g/dL (ref 6.5–8.1)

## 2022-03-08 LAB — LACTATE DEHYDROGENASE: LDH: 144 U/L (ref 98–192)

## 2022-03-08 LAB — CEA (IN HOUSE-CHCC): CEA (CHCC-In House): 3.59 ng/mL (ref 0.00–5.00)

## 2022-03-08 NOTE — Progress Notes (Signed)
Hematology and Oncology Follow Up Visit  Lauren Mcpherson [id number] Oct 02, 1965 56 y.o. 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  She did undergo microwave ablation of her liver met.  This was on 02/22/2022.  I am so thankful for the incredible expertise of Interventional Radiology.  She feels well.  She really is enjoying being off treatment.  I think we can keep her off treatment for right now get her through the holidays.  She is on Xarelto.  She is on 10 mg daily.  She is doing well with the Xarelto.  There is no bleeding.  She has had no nausea or vomiting.  There is no diarrhea.  She has had no cough or shortness of breath.  She has had no leg swelling.  Her last CEA level was 3.7.  She says she is going to have MRI of the liver in December.  Overall, I would say that her performance status is probably ECOG 0.  Medications:  Allergies as of 03/08/2022   No Known Allergies      Medication List        Accurate as of March 08, 2022  9:41 AM. If you have any questions, ask your nurse or doctor.          capecitabine 500 MG tablet Commonly known as: XELODA Take 5 tablets (2,500 mg total) by mouth 2 (two) times daily with a meal. Take for 14 days, then hold for 7 days.   rivaroxaban 10 MG Tabs tablet Commonly known as: XARELTO Take 1 tablet (10 mg total) by mouth daily with supper.        Allergies: No Known Allergies  Past Medical History, Surgical  history, Social history, and Family History were reviewed and updated.  Review of Systems: Review of Systems  Constitutional: Negative.   HENT: Negative.    Eyes: Negative.   Respiratory: Negative.    Cardiovascular: Negative.   Gastrointestinal: Negative.   Genitourinary: Negative.   Musculoskeletal: Negative.   Skin: Negative.   Neurological: Negative.   Endo/Heme/Allergies: Negative.   Psychiatric/Behavioral: Negative.       Physical Exam:  height is _0  (1.626 m) and weight is 264 lb (119.7 kg). Her oral temperature is 97.9 F (36.6 C). Her blood pressure is 121/70 and her pulse is 76. Her respiration is 20 and oxygen saturation is 100%.   Wt Readings from Last 3 Encounters:  03/08/22 264 lb (119.7 kg)  02/22/22 261 lb 14.5 oz (118.8 kg)  02/10/22 262 lb (118.8 kg)    Physical Exam Vitals reviewed.  HENT:     Head: Normocephalic and atraumatic.  Eyes:     Pupils: Pupils are equal, round, and reactive to light.  Cardiovascular:     Rate and Rhythm: Normal rate and regular rhythm.     Heart sounds: Normal heart sounds.  Pulmonary:     Effort: Pulmonary effort is normal.     Breath  sounds: Normal breath sounds.  Abdominal:     General: Bowel sounds are normal.     Palpations: Abdomen is soft.     Comments: Abdominal exam is obese but soft.  She has a laparotomy scar that is well-healed.  There is no fluid wave.  There is no palpable liver or spleen tip.  She has no abdominal mass.  Musculoskeletal:        General: No tenderness or deformity. Normal range of motion.     Cervical back: Normal range of motion.  Lymphadenopathy:     Cervical: No cervical adenopathy.  Skin:    General: Skin is warm and dry.     Findings: No erythema or rash.  Neurological:     Mental Status: She is alert and oriented to person, place, and time.  Psychiatric:        Behavior: Behavior normal.        Thought Content: Thought content normal.        Judgment: Judgment normal.       Lab Results  Component Value Date   WBC 6.2 03/08/2022   HGB 12.0 03/08/2022   HCT 37.4 03/08/2022   MCV 95.9 03/08/2022   PLT 281 03/08/2022   Lab Results  Component Value Date   FERRITIN 312 (H) 12/28/2021   IRON 57 12/28/2021   TIBC 230 (L) 12/28/2021   UIBC 173 12/28/2021   IRONPCTSAT 25 12/28/2021   Lab Results  Component Value Date   RETICCTPCT 1.5 12/14/2021   RBC 3.90 03/08/2022   No results found for: "KPAFRELGTCHN", "LAMBDASER", "KAPLAMBRATIO" No results found for: "IGGSERUM", "IGA", "IGMSERUM" No results found for: "TOTALPROTELP", "ALBUMINELP", "A1GS", "A2GS", "BETS", "BETA2SER", "GAMS", "MSPIKE", "SPEI"   Chemistry      Component Value Date/Time   NA 144 03/08/2022 0855   K 4.4 03/08/2022 0855   CL 106 03/08/2022 0855   CO2 29 03/08/2022 0855   BUN 13 03/08/2022 0855   CREATININE 0.81 03/08/2022 0855      Component Value Date/Time   CALCIUM 9.7 03/08/2022 0855   ALKPHOS 86 03/08/2022 0855   AST 15 03/08/2022 0855   ALT 9 03/08/2022 0855   BILITOT 0.4 03/08/2022 0855       Impression and Plan: Lauren Mcpherson is a very pleasant 56 yo African American female with metastatic 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    Again, I am very happy about the fact that she had intrahepatic therapy.  Hopefully, this will keep her in remission for quite a while.  Again, we will give her a "holiday" from treatment.  She really deserves this.  I know that this will make her feel better and she will have a wonderful Thanksgiving and Christmas.  At this point, we can probably get her back in 2 months.  We will see what the MRI has to show.  She will continue the Xarelto.   [name], MD 11/15/20239:41 AM

## 2022-03-09 ENCOUNTER — Other Ambulatory Visit (HOSPITAL_COMMUNITY): Payer: Self-pay

## 2022-03-24 ENCOUNTER — Other Ambulatory Visit: Payer: PRIVATE HEALTH INSURANCE

## 2022-04-14 ENCOUNTER — Ambulatory Visit (HOSPITAL_COMMUNITY)
Admission: RE | Admit: 2022-04-14 | Discharge: 2022-04-14 | Disposition: A | Discharge status: Home / Self Care | Payer: PRIVATE HEALTH INSURANCE | Source: Ambulatory Visit | Attending: Internal Medicine | Admitting: Internal Medicine

## 2022-04-14 DIAGNOSIS — K769 Liver disease, unspecified: Secondary | ICD-10-CM | POA: Insufficient documentation

## 2022-04-14 MED ORDER — GADOBUTROL 1 MMOL/ML IV SOLN
10.0000 mL | Freq: Once | INTRAVENOUS | Status: AC | PRN
  Administered 2022-04-14: 10 mL via INTRAVENOUS

## 2022-04-20 ENCOUNTER — Other Ambulatory Visit (HOSPITAL_COMMUNITY): Payer: Self-pay

## 2022-04-26 ENCOUNTER — Ambulatory Visit
Admission: RE | Admit: 2022-04-26 | Discharge: 2022-04-26 | Disposition: A | Discharge status: Home / Self Care | Payer: PRIVATE HEALTH INSURANCE | Source: Ambulatory Visit | Attending: Internal Medicine | Admitting: Internal Medicine

## 2022-04-26 DIAGNOSIS — K769 Liver disease, unspecified: Secondary | ICD-10-CM

## 2022-04-26 HISTORY — PX: IR RADIOLOGIST EVAL & MGMT: IMG5224

## 2022-04-26 NOTE — Progress Notes (Signed)
Reason for follow up: Colon cancer metastatic to the liver, status post microwave ablation on 02/22/22   Referring Physician(s): Ennever,Peter R   History of Present Illness: Initial consult HPI: Lauren Mcpherson is a 57 y.o. 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  She presents via virtual telephone clinic visit for evaluation of locoregional liver treatment by the gracious referral of Dr. Marin Olp.   She is status post 13 cycles of FOLOXIRI/Avastin (now off avastin and oxaliplatin due to complications including PE) and now on Xeloda.  She initially presented with multifocal liver masses, now with only 2 discrete small masses remaining in the caudate lobe  and segment IV.  These have been stable since at least March 2023 by MRI.  And prior to that diminishing in size serially since first seen in June 2022.     She overall states she is doing well.  No abdominal pain, nausea, vomiting.   She has done very well since the procedure and continues to feel great.  No abdominal pain, nausea, vomiting, fevers, or chills.  She follows up with Dr. Marin Olp later this month.    Past Medical History:  Diagnosis Date   Arthritis    Cancer [facility])    Goals of care, counseling/discussion 11/01/2020    Past Surgical History:  Procedure Laterality Date   BIOPSY  10/13/2020   Procedure: BIOPSY;  Surgeon: Ronald Lobo, MD;  Location: [facility];  Service: Endoscopy;;   CHOLECYSTECTOMY     COLONOSCOPY WITH PROPOFOL N/A 10/13/2020   Procedure: COLONOSCOPY WITH PROPOFOL;  Surgeon: Ronald Lobo, MD;  Location: [facility];  Service: Endoscopy;  Laterality: N/A;   IR CV LINE INJECTION  11/24/2020   IR IMAGING GUIDED PORT INSERTION  11/08/2020   IR RADIOLOGIST EVAL & MGMT  01/11/2022   IR REMOVAL TUN ACCESS W/ PORT W/O FL MOD SED  12/01/2020   IR US GUIDE VASC ACCESS RIGHT  11/29/2021   LAPAROSCOPIC RIGHT COLECTOMY Right 10/15/2020   Procedure: LAPAROSCOPIC RIGHT COLECTOMY,  LAPAROSCOPIC LIVER BIOPSY, LAPAROSCOPIC TAP BLOCK;  Surgeon: Greer Pickerel, MD;  Location: [facility];  Service: General;  Laterality: Right;  90 MINUTES   POLYPECTOMY  10/13/2020   Procedure: POLYPECTOMY;  Surgeon: Ronald Lobo, MD;  Location: [facility];  Service: Endoscopy;;   RADIOLOGY WITH ANESTHESIA N/A 02/22/2022   Procedure: CT WITH ANESTHESIA MICROWAVE ABLATION;  Surgeon: Suzette Battiest, MD;  Location: [facility];  Service: Radiology;  Laterality: N/A;   TONSILLECTOMY      Allergies: Patient has no known allergies.  Medications: Prior to Admission medications   Medication Sig Start Date End Date Taking? Authorizing Provider  capecitabine (XELODA) 500 MG tablet Take 5 tablets (2,500 mg total) by mouth 2 (two) times daily with a meal. Take for 14 days, then hold for 7 days. Patient not taking: Reported on 03/08/2022 12/29/21   [name], MD  rivaroxaban (XARELTO) 10 MG TABS tablet Take 1 tablet (10 mg total) by mouth daily with supper. 01/25/22   [name], MD     No family history on file.  Social History   Socioeconomic History   Marital status: Married    Spouse name: Not on file   Number of children: Not on file   Years of education: Not on file   Highest education level: Not on file  Occupational History   Not on file  Tobacco Use   Smoking status: Former    Packs/day: 0.50  Years: 20.00    Total pack years: 10.00    Types: Cigarettes    Quit date: 2009    Years since quitting: 15.0   Smokeless tobacco: Never  Vaping Use   Vaping Use: Never used  Substance and Sexual Activity   Alcohol use: Yes    Comment: occ   Drug use: No   Sexual activity: Yes    Birth control/protection: None  Other Topics Concern   Not on file  Social History Narrative   Not on file   Social Determinants of Health   Financial Resource Strain: Low Risk  (11/30/2021)   Overall Financial Resource Strain (CARDIA)    Difficulty of Paying Living Expenses: Not very hard  Food  Insecurity: No Food Insecurity (11/30/2021)   Hunger Vital Sign    Worried About Running Out of Food in the Last Year: Never true    [place] in the Last Year: Never true  Transportation Needs: Unmet Transportation Needs (11/30/2021)   PRAPARE - Transportation    Lack of Transportation (Medical): Yes    Lack of Transportation (Non-Medical): Yes  Physical Activity: Insufficiently Active (11/30/2021)   Exercise Vital Sign    Days of Exercise per Week: 3 days    Minutes of Exercise per Session: 30 min  Stress: Not on file  Social Connections: Socially Integrated (11/30/2021)   Social Connection and Isolation Panel [NHANES]    Frequency of Communication with Friends and Family: More than three times a week    Frequency of Social Gatherings with Friends and Family: More than three times a week    Attends Religious Services: More than 4 times per year    Active Member of [employer] or Organizations: Yes    Attends Archivist Meetings: More than 4 times per year    Marital Status: Married     Vital Signs: LMP 09/22/2020 (Approximate) Comment: Spouse had a vasectomy  No physical examination was performed in lieu of virtual telephone clinic visit.  Imaging: Index MRI (10/11/20)  Seg IV mass    Caudate and right sided masses   12/17/21 MRI Abdomen  2.1 cm segment IV mass    Tiny caudate mass  MRI 04/14/22  Ablation cavity with mild peripheral enhancement.  Decreased size and conspicuity of tiny caudate lobe focus.  Labs:  CBC: Recent Labs    12/28/21 0812 01/25/22 1118 02/10/22 0948 03/08/22 0855  WBC 4.6 5.2 3.5* 6.2  HGB 11.5* 12.0 12.3 12.0  HCT 36.1 37.2 38.1 37.4  PLT 237 263 233 281    COAGS: Recent Labs    02/10/22 0948  INR 1.3*    BMP: Recent Labs    12/28/21 0812 01/25/22 1118 02/10/22 0948 03/08/22 0855  NA 143 142 142 144  K 3.6 4.1 3.5 4.4  CL 106 105 109 106  CO2 '30 31 27 29  '$ GLUCOSE 112* 116* 117* 111*  BUN '12 11 11 13  '$ CALCIUM  9.2 9.8 9.1 9.7  CREATININE 0.71 0.77 0.69 0.81  GFRNONAA >60 >60 >60 >60    LIVER FUNCTION TESTS: Recent Labs    12/28/21 0812 01/25/22 1118 02/10/22 0948 03/08/22 0855  BILITOT 0.4 0.4 0.9 0.4  AST 13* 12* 17 15  ALT '11 7 11 9  '$ ALKPHOS 72 73 68 86  PROT 6.2* 6.9 6.7 6.9  ALBUMIN 3.7 4.0 3.5 3.8    Assessment and Plan: 57 year old female with history of stage IV cecal adenocarcinoma with metastasis to  the liver status post FOLFOXIRI and now on Xeloda. She has a 2.1 cm anterior segment IV subcapsular mass with mild enhancement on MRI, now status post microwave ablation on 02/22/22.  2 month follow up MRI demonstrates mild peripheral enhancement about the ablation cavity, likely representing post-treatment changes.  The previously visualized focal caudate mass is less conspicuous.    Plan for repeat MRI abdomen with contrast in 3 months with IR follow up shortly thereafter.   Electronically Signed: Suzette Battiest 04/26/2022, 9:28 AM   I spent a total of 25 Minutes in virtual telephone clinical consultation, greater than 50% of which was counseling/coordinating care for metastatic colon cancer.

## 2022-05-05 ENCOUNTER — Encounter: Payer: Self-pay | Admitting: Family

## 2022-05-08 ENCOUNTER — Other Ambulatory Visit: Payer: Self-pay

## 2022-05-08 ENCOUNTER — Inpatient Hospital Stay: Payer: PRIVATE HEALTH INSURANCE | Attending: Hematology & Oncology

## 2022-05-08 ENCOUNTER — Inpatient Hospital Stay (HOSPITAL_BASED_OUTPATIENT_CLINIC_OR_DEPARTMENT_OTHER): Payer: PRIVATE HEALTH INSURANCE | Admitting: Hematology & Oncology

## 2022-05-08 ENCOUNTER — Encounter: Payer: Self-pay | Admitting: Hematology & Oncology

## 2022-05-08 VITALS — BP 117/74 | HR 71 | Temp 97.8°F | Resp 18 | Ht 64.0 in | Wt 270.0 lb

## 2022-05-08 DIAGNOSIS — C180 Malignant neoplasm of cecum: Secondary | ICD-10-CM

## 2022-05-08 DIAGNOSIS — Z7901 Long term (current) use of anticoagulants: Secondary | ICD-10-CM | POA: Diagnosis not present

## 2022-05-08 DIAGNOSIS — C787 Secondary malignant neoplasm of liver and intrahepatic bile duct: Secondary | ICD-10-CM | POA: Insufficient documentation

## 2022-05-08 LAB — CMP (CANCER CENTER ONLY)
ALT: 10 U/L (ref 0–44)
AST: 16 U/L (ref 15–41)
Albumin: 4.1 g/dL (ref 3.5–5.0)
Alkaline Phosphatase: 69 U/L (ref 38–126)
Anion gap: 8 (ref 5–15)
BUN: 11 mg/dL (ref 6–20)
CO2: 29 mmol/L (ref 22–32)
Calcium: 9.5 mg/dL (ref 8.9–10.3)
Chloride: 104 mmol/L (ref 98–111)
Creatinine: 0.84 mg/dL (ref 0.44–1.00)
GFR, Estimated: >60 mL/min (ref >60)
Glucose, Bld: 102 mg/dL — ABNORMAL HIGH (ref 70–99)
Potassium: 3.9 mmol/L (ref 3.5–5.1)
Sodium: 141 mmol/L (ref 135–145)
Total Bilirubin: 0.6 mg/dL (ref 0.3–1.2)
Total Protein: 7.4 g/dL (ref 6.5–8.1)

## 2022-05-08 LAB — CBC WITH DIFFERENTIAL (CANCER CENTER ONLY)
Abs Immature Granulocytes: 0 10*3/uL (ref 0.00–0.07)
Basophils Absolute: 0.1 10*3/uL (ref 0.0–0.1)
Basophils Relative: 1 %
Eosinophils Absolute: 0.4 10*3/uL (ref 0.0–0.5)
Eosinophils Relative: 9 %
HCT: 41 % (ref 36.0–46.0)
Hemoglobin: 13.2 g/dL (ref 12.0–15.0)
Immature Granulocytes: 0 %
Lymphocytes Relative: 43 %
Lymphs Abs: 1.9 10*3/uL (ref 0.7–4.0)
MCH: 30.3 pg (ref 26.0–34.0)
MCHC: 32.2 g/dL (ref 30.0–36.0)
MCV: 94.3 fL (ref 80.0–100.0)
Monocytes Absolute: 0.4 10*3/uL (ref 0.1–1.0)
Monocytes Relative: 10 %
Neutro Abs: 1.6 10*3/uL — ABNORMAL LOW (ref 1.7–7.7)
Neutrophils Relative %: 37 %
Platelet Count: 288 10*3/uL (ref 150–400)
RBC: 4.35 MIL/uL (ref 3.87–5.11)
RDW: 15.7 % — ABNORMAL HIGH (ref 11.5–15.5)
WBC Count: 4.4 10*3/uL (ref 4.0–10.5)
nRBC: 0 % (ref 0.0–0.2)

## 2022-05-08 LAB — CEA (IN HOUSE-CHCC): CEA (CHCC-In House): 11.02 ng/mL — ABNORMAL HIGH (ref 0.00–5.00)

## 2022-05-08 LAB — LACTATE DEHYDROGENASE: LDH: 129 U/L (ref 98–192)

## 2022-05-08 NOTE — Progress Notes (Signed)
Hematology and Oncology Follow Up Visit  SARINITY DICICCO [id number] 1966/03/14 57 y.o. 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 Spallone is here today for follow-up.  She really looks fantastic.  Everything seems to be going quite well for her.  She enjoyed the holidays.  She did gain a little bit of weight.  She did have an MRI of the liver back in December.  This showed the ablation cavity in the left lobe.  There is little bit of an enhancement along one of the edges.  This will have to be followed.  She will have another MRI in March.  She has had no abdominal pain.  There is been no cough or shortness of breath.  She has had no fever.  She has had no bleeding.  There is been no obvious change in bowel or bladder habits.  There is been no diarrhea.  She has had no leg swelling.  She has had no rashes.  She continues on the Xarelto.  I think she is doing a good job on the [place].  We will keep her on Xarelto for right now.  At some point, we probably have to repeat a Doppler of her legs to see if the thrombus has resolved.  Her last CEA level back in November was 3.6.  This is holding stable.  Currently, I would say that her performance status is probably ECOG 1.  Medications:  Allergies as of 05/08/2022   No Known Allergies      Medication List        Accurate as of May 08, 2022 12:55 PM. If you have any questions, ask your nurse or doctor.          capecitabine 500 MG tablet Commonly known as:  XELODA Take 5 tablets (2,500 mg total) by mouth 2 (two) times daily with a meal. Take for 14 days, then hold for 7 days.   rivaroxaban 10 MG Tabs tablet Commonly known as: XARELTO Take 1 tablet (10 mg total) by mouth daily with supper.        Allergies: No Known Allergies  Past Medical History, Surgical history, Social history, and Family History were reviewed and updated.  Review of Systems: Review of Systems  Constitutional: Negative.   HENT: Negative.    Eyes: Negative.   Respiratory: Negative.    Cardiovascular: Negative.   Gastrointestinal: Negative.   Genitourinary: Negative.   Musculoskeletal: Negative.   Skin: Negative.   Neurological: Negative.   Endo/Heme/Allergies: Negative.   Psychiatric/Behavioral: Negative.       Physical Exam:  height is '5\' 4"'$  (1.626 m) and weight is 270 lb (122.5 kg). Her oral temperature is 97.8 F (36.6 C). Her blood pressure is 117/74 and her pulse is 71. Her respiration is 18 and oxygen saturation is 100%.   Wt Readings from Last 3 Encounters:  05/08/22 270 lb (122.5 kg)  03/08/22 264 lb (119.7 kg)  02/22/22 261 lb 14.5 oz (118.8 kg)  Physical Exam Vitals reviewed.  HENT:     Head: Normocephalic and atraumatic.  Eyes:     Pupils: Pupils are equal, round, and reactive to light.  Cardiovascular:     Rate and Rhythm: Normal rate and regular rhythm.     Heart sounds: Normal heart sounds.  Pulmonary:     Effort: Pulmonary effort is normal.     Breath sounds: Normal breath sounds.  Abdominal:     General: Bowel sounds are normal.     Palpations: Abdomen is soft.     Comments: Abdominal exam is obese but soft.  She has a laparotomy scar that is well-healed.  There is no fluid wave.  There is no palpable liver or spleen tip.  She has no abdominal mass.  Musculoskeletal:        General: No tenderness or deformity. Normal range of motion.     Cervical back: Normal range of motion.  Lymphadenopathy:     Cervical: No cervical  adenopathy.  Skin:    General: Skin is warm and dry.     Findings: No erythema or rash.  Neurological:     Mental Status: She is alert and oriented to person, place, and time.  Psychiatric:        Behavior: Behavior normal.        Thought Content: Thought content normal.        Judgment: Judgment normal.      Lab Results  Component Value Date   WBC 4.4 05/08/2022   HGB 13.2 05/08/2022   HCT 41.0 05/08/2022   MCV 94.3 05/08/2022   PLT 288 05/08/2022   Lab Results  Component Value Date   FERRITIN 312 (H) 12/28/2021   IRON 57 12/28/2021   TIBC 230 (L) 12/28/2021   UIBC 173 12/28/2021   IRONPCTSAT 25 12/28/2021   Lab Results  Component Value Date   RETICCTPCT 1.5 12/14/2021   RBC 4.35 05/08/2022   No results found for: "KPAFRELGTCHN", "LAMBDASER", "KAPLAMBRATIO" No results found for: "IGGSERUM", "IGA", "IGMSERUM" No results found for: "TOTALPROTELP", "ALBUMINELP", "A1GS", "A2GS", "BETS", "BETA2SER", "GAMS", "MSPIKE", "SPEI"   Chemistry      Component Value Date/Time   NA 141 05/08/2022 1158   K 3.9 05/08/2022 1158   CL 104 05/08/2022 1158   CO2 29 05/08/2022 1158   BUN 11 05/08/2022 1158   CREATININE 0.84 05/08/2022 1158      Component Value Date/Time   CALCIUM 9.5 05/08/2022 1158   ALKPHOS 69 05/08/2022 1158   AST 16 05/08/2022 1158   ALT 10 05/08/2022 1158   BILITOT 0.6 05/08/2022 1158       Impression and Plan: Ms. Nery is a very pleasant 57 yo African American female with metastatic 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    Again, I am very happy about the fact that she had intrahepatic therapy.  Hopefully, this will keep her in remission for quite a while.  We will have to see what the MRI shows in March.  She will continue the Xarelto.  I would like to get her back in 3 months.  By then, she would have had the MRI to see if there is any changes in the liver.   [name], MD 1/15/202412:55 PM

## 2022-05-09 ENCOUNTER — Other Ambulatory Visit: Payer: Self-pay | Admitting: Hematology & Oncology

## 2022-05-09 DIAGNOSIS — C180 Malignant neoplasm of cecum: Secondary | ICD-10-CM

## 2022-05-10 ENCOUNTER — Telehealth: Payer: Self-pay

## 2022-05-10 NOTE — Telephone Encounter (Signed)
LMOM advising pt of results (ok per DPR). PET scan orders are on file.

## 2022-05-10 NOTE — Telephone Encounter (Signed)
-----  Message from Lauren Napoleon, MD sent at 05/09/2022  7:51 PM EST ----- Call - the CEA marker is up!!  We need a PET scan to see if the tumor is active.  I will set this up.  Lauren Mcpherson

## 2022-05-29 ENCOUNTER — Encounter (HOSPITAL_COMMUNITY)
Admission: RE | Admit: 2022-05-29 | Discharge: 2022-05-29 | Disposition: A | Discharge status: Home / Self Care | Payer: PRIVATE HEALTH INSURANCE | Source: Ambulatory Visit | Attending: Hematology & Oncology | Admitting: Hematology & Oncology

## 2022-05-29 DIAGNOSIS — C180 Malignant neoplasm of cecum: Secondary | ICD-10-CM | POA: Insufficient documentation

## 2022-05-29 LAB — GLUCOSE, CAPILLARY: Glucose-Capillary: 107 mg/dL — ABNORMAL HIGH (ref 70–99)

## 2022-05-29 MED ORDER — FLUDEOXYGLUCOSE F - 18 (FDG) INJECTION
14.0000 | Freq: Once | INTRAVENOUS | Status: AC
  Administered 2022-05-29: 13.51 via INTRAVENOUS

## 2022-05-30 ENCOUNTER — Other Ambulatory Visit (HOSPITAL_COMMUNITY): Payer: Self-pay

## 2022-06-15 ENCOUNTER — Inpatient Hospital Stay: Payer: PRIVATE HEALTH INSURANCE | Attending: Hematology & Oncology

## 2022-06-15 ENCOUNTER — Other Ambulatory Visit: Payer: Self-pay

## 2022-06-15 ENCOUNTER — Inpatient Hospital Stay: Payer: PRIVATE HEALTH INSURANCE | Admitting: Hematology & Oncology

## 2022-06-15 ENCOUNTER — Encounter: Payer: Self-pay | Admitting: Hematology & Oncology

## 2022-06-15 VITALS — BP 120/69 | HR 77 | Temp 98.0°F | Resp 18 | Ht 64.0 in | Wt 274.0 lb

## 2022-06-15 DIAGNOSIS — C180 Malignant neoplasm of cecum: Secondary | ICD-10-CM | POA: Insufficient documentation

## 2022-06-15 DIAGNOSIS — C787 Secondary malignant neoplasm of liver and intrahepatic bile duct: Secondary | ICD-10-CM | POA: Diagnosis not present

## 2022-06-15 LAB — CBC WITH DIFFERENTIAL (CANCER CENTER ONLY)
Abs Immature Granulocytes: 0.01 10*3/uL (ref 0.00–0.07)
Basophils Absolute: 0.1 10*3/uL (ref 0.0–0.1)
Basophils Relative: 1 %
Eosinophils Absolute: 0.3 10*3/uL (ref 0.0–0.5)
Eosinophils Relative: 7 %
HCT: 42.3 % (ref 36.0–46.0)
Hemoglobin: 13.7 g/dL (ref 12.0–15.0)
Immature Granulocytes: 0 %
Lymphocytes Relative: 47 %
Lymphs Abs: 2.2 10*3/uL (ref 0.7–4.0)
MCH: 29.7 pg (ref 26.0–34.0)
MCHC: 32.4 g/dL (ref 30.0–36.0)
MCV: 91.6 fL (ref 80.0–100.0)
Monocytes Absolute: 0.5 10*3/uL (ref 0.1–1.0)
Monocytes Relative: 10 %
Neutro Abs: 1.7 10*3/uL (ref 1.7–7.7)
Neutrophils Relative %: 35 %
Platelet Count: 260 10*3/uL (ref 150–400)
RBC: 4.62 MIL/uL (ref 3.87–5.11)
RDW: 15.7 % — ABNORMAL HIGH (ref 11.5–15.5)
WBC Count: 4.7 10*3/uL (ref 4.0–10.5)
nRBC: 0 % (ref 0.0–0.2)

## 2022-06-15 LAB — CMP (CANCER CENTER ONLY)
ALT: 10 U/L (ref 0–44)
AST: 16 U/L (ref 15–41)
Albumin: 4.1 g/dL (ref 3.5–5.0)
Alkaline Phosphatase: 74 U/L (ref 38–126)
Anion gap: 8 (ref 5–15)
BUN: 12 mg/dL (ref 6–20)
CO2: 28 mmol/L (ref 22–32)
Calcium: 9.5 mg/dL (ref 8.9–10.3)
Chloride: 106 mmol/L (ref 98–111)
Creatinine: 0.84 mg/dL (ref 0.44–1.00)
GFR, Estimated: >60 mL/min (ref >60)
Glucose, Bld: 116 mg/dL — ABNORMAL HIGH (ref 70–99)
Potassium: 4.6 mmol/L (ref 3.5–5.1)
Sodium: 142 mmol/L (ref 135–145)
Total Bilirubin: 0.4 mg/dL (ref 0.3–1.2)
Total Protein: 7.2 g/dL (ref 6.5–8.1)

## 2022-06-15 LAB — CEA (IN HOUSE-CHCC): CEA (CHCC-In House): 21.6 ng/mL — ABNORMAL HIGH (ref 0.00–5.00)

## 2022-06-15 LAB — LACTATE DEHYDROGENASE: LDH: 136 U/L (ref 98–192)

## 2022-06-15 NOTE — Progress Notes (Signed)
Hematology and Oncology Follow Up Visit  Lauren Mcpherson [id number] [birth year] 57 y.o. 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 is here today for follow-up.  Unfortunately, looks like that we may be dealing with an issue with respect to recurrence.  She had a PET scan that was done on 05/29/2022.  The PET scan did seem to show that there was a new caudate lesion with hypermetabolic features.  In addition, there is seem to be recurrence inferior to the treated lesion in the anterior right/medial left hepatic lobe.  Thankfully, I do not see any evidence of recurrence outside of the liver.  When we last saw her, her CEA level is also up.  I think it went from 3.6 up to 11.  I will have to see if Interventional Radiology can do some more intrahepatic therapy.  If not, there may have to think about systemic therapy of her again.  She is having no abdominal pain.  There is no nausea or vomiting.  There is no change in bowel or bladder habits.  She has had no cough or shortness of breath.  She has had no bleeding.  There is been no rashes.  Overall, I would say her performance status is probably ECOG 0.    Medications:  Allergies as of 06/15/2022   No Known Allergies      Medication List        Accurate as of June 15, 2022  1:01 PM. If you have any questions, ask your nurse or doctor.          capecitabine 500 MG tablet Commonly known as: XELODA Take 5 tablets (2,500 mg total) by mouth 2 (two) times daily  with a meal. Take for 14 days, then hold for 7 days.   rivaroxaban 10 MG Tabs tablet Commonly known as: XARELTO Take 1 tablet (10 mg total) by mouth daily with supper.        Allergies: No Known Allergies  Past Medical History, Surgical history, Social history, and Family History were reviewed and updated.  Review of Systems: Review of Systems  Constitutional: Negative.   HENT: Negative.    Eyes: Negative.   Respiratory: Negative.    Cardiovascular: Negative.   Gastrointestinal: Negative.   Genitourinary: Negative.   Musculoskeletal: Negative.   Skin: Negative.   Neurological: Negative.   Endo/Heme/Allergies: Negative.   Psychiatric/Behavioral: Negative.       Physical Exam:  height is 5' 4"$  (1.626 m) and weight is 274 lb (124.3 kg). Her oral temperature is 98 F (36.7 C). Her blood pressure is 120/69 and her pulse is 77. Her respiration is 18 and oxygen saturation is 100%.   Wt Readings from Last 3 Encounters:  06/15/22 274 lb (124.3 kg)  05/08/22 270 lb (122.5 kg)  03/08/22 264 lb (119.7 kg)    Physical Exam Vitals reviewed.  HENT:     Head: Normocephalic and atraumatic.  Eyes:     Pupils: Pupils are equal,  round, and reactive to light.  Cardiovascular:     Rate and Rhythm: Normal rate and regular rhythm.     Heart sounds: Normal heart sounds.  Pulmonary:     Effort: Pulmonary effort is normal.     Breath sounds: Normal breath sounds.  Abdominal:     General: Bowel sounds are normal.     Palpations: Abdomen is soft.     Comments: Abdominal exam is obese but soft.  She has a laparotomy scar that is well-healed.  There is no fluid wave.  There is no palpable liver or spleen tip.  She has no abdominal mass.  Musculoskeletal:        General: No tenderness or deformity. Normal range of motion.     Cervical back: Normal range of motion.  Lymphadenopathy:     Cervical: No cervical adenopathy.  Skin:    General: Skin is warm and dry.     Findings: No erythema  or rash.  Neurological:     Mental Status: She is alert and oriented to person, place, and time.  Psychiatric:        Behavior: Behavior normal.        Thought Content: Thought content normal.        Judgment: Judgment normal.      Lab Results  Component Value Date   WBC 4.7 06/15/2022   HGB 13.7 06/15/2022   HCT 42.3 06/15/2022   MCV 91.6 06/15/2022   PLT 260 06/15/2022   Lab Results  Component Value Date   FERRITIN 312 (H) 12/28/2021   IRON 57 12/28/2021   TIBC 230 (L) 12/28/2021   UIBC 173 12/28/2021   IRONPCTSAT 25 12/28/2021   Lab Results  Component Value Date   RETICCTPCT 1.5 12/14/2021   RBC 4.62 06/15/2022   No results found for: "KPAFRELGTCHN", "LAMBDASER", "KAPLAMBRATIO" No results found for: "IGGSERUM", "IGA", "IGMSERUM" No results found for: "TOTALPROTELP", "ALBUMINELP", "A1GS", "A2GS", "BETS", "BETA2SER", "GAMS", "MSPIKE", "SPEI"   Chemistry      Component Value Date/Time   NA 141 05/08/2022 1158   K 3.9 05/08/2022 1158   CL 104 05/08/2022 1158   CO2 29 05/08/2022 1158   BUN 11 05/08/2022 1158   CREATININE 0.84 05/08/2022 1158      Component Value Date/Time   CALCIUM 9.5 05/08/2022 1158   ALKPHOS 69 05/08/2022 1158   AST 16 05/08/2022 1158   ALT 10 05/08/2022 1158   BILITOT 0.6 05/08/2022 1158       Impression and Plan: [name] is a very pleasant 57 yo African American female with metastatic 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    We had on systemic therapy for quite a while.  We then went ahead and did intrahepatic therapy on her.  This was done back in November 2023.  Looks like she has recurrence.  I will see if IR can do another round of intrahepatic therapy.  If not, then we will do systemic therapy with Lonsurf/Avastin.  I think this would be a very reasonable way to go.  I do not think we have to do any biopsies right now.  However, this certainly could be a consideration in the future.  Once I know from IR what they can  do, then we will plan to get her back.  I just hate this for her.  I really thought we could get a prolonged remission from the intrahepatic therapy.  She has done everything we have asked her to do.  She has had great support from her family.  I just  want her to have a long quality and quantity of life.   [name], MD 2/22/20241:01 PM

## 2022-06-16 NOTE — Addendum Note (Signed)
Addended by: Burney Gauze R on: 06/16/2022 12:30 PM   Modules accepted: Orders

## 2022-06-24 ENCOUNTER — Ambulatory Visit (HOSPITAL_COMMUNITY)
Admission: RE | Admit: 2022-06-24 | Discharge: 2022-06-24 | Disposition: A | Discharge status: Home / Self Care | Payer: PRIVATE HEALTH INSURANCE | Source: Ambulatory Visit | Attending: Hematology & Oncology | Admitting: Hematology & Oncology

## 2022-06-24 DIAGNOSIS — C180 Malignant neoplasm of cecum: Secondary | ICD-10-CM

## 2022-06-24 MED ORDER — GADOBUTROL 1 MMOL/ML IV SOLN
10.0000 mL | Freq: Once | INTRAVENOUS | Status: AC | PRN
  Administered 2022-06-24: 10 mL via INTRAVENOUS

## 2022-06-29 ENCOUNTER — Other Ambulatory Visit: Payer: Self-pay

## 2022-06-29 ENCOUNTER — Encounter: Payer: Self-pay | Admitting: Hematology & Oncology

## 2022-06-29 ENCOUNTER — Inpatient Hospital Stay: Payer: PRIVATE HEALTH INSURANCE | Attending: Hematology & Oncology | Admitting: Hematology & Oncology

## 2022-06-29 VITALS — BP 133/69 | HR 84 | Temp 98.1°F | Resp 16 | Ht 64.0 in | Wt 272.0 lb

## 2022-06-29 DIAGNOSIS — Z7901 Long term (current) use of anticoagulants: Secondary | ICD-10-CM | POA: Diagnosis not present

## 2022-06-29 DIAGNOSIS — C180 Malignant neoplasm of cecum: Secondary | ICD-10-CM | POA: Diagnosis not present

## 2022-06-29 DIAGNOSIS — C787 Secondary malignant neoplasm of liver and intrahepatic bile duct: Secondary | ICD-10-CM | POA: Diagnosis not present

## 2022-06-29 DIAGNOSIS — Z86718 Personal history of other venous thrombosis and embolism: Secondary | ICD-10-CM | POA: Diagnosis not present

## 2022-06-29 NOTE — Progress Notes (Signed)
Hematology and Oncology Follow Up Visit  Lauren Mcpherson [id number] 1965-07-26 57 y.o. 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  Unfortunately, I think she does have progressive disease.  We did do an MRI of the abdomen.  This was done on 06/24/2022.  This did show increase in hepatic disease.  She has increased where she had her past ablation.  Also looks that there is a new lesion in the caudate lobe.  It looks like her disease is still confined to the liver.  I will see if there is anything else that IR can do to try to help her out.  Given the fact that she still has only liver disease, we may want to think about intrahepatic chemotherapy.  Again this is some that we just do not have the capability of doing in the community.  If IR cannot help [place] out, I think would be worthwhile referring her over to [facility] for the possibility of intrahepatic infusional 5-FU.  Of note, her last CEA level is also up at 20.5.  She does not have any abdominal pain.  She still looks great.  She has had no nausea or vomiting.  She has had no cough or shortness of breath.  There is no bony pain.  There is no change in bowel or bladder habits.  Overall, I would say that her performance status is probably ECOG 0.    Medications:  Allergies as of 06/29/2022   No Known Allergies      Medication List        Accurate as of June 29, 2022  3:36 PM. If you have any questions, ask your nurse or doctor.           capecitabine 500 MG tablet Commonly known as: XELODA Take 5 tablets (2,500 mg total) by mouth 2 (two) times daily with a meal. Take for 14 days, then hold for 7 days.   rivaroxaban 10 MG Tabs tablet Commonly known as: XARELTO Take 1 tablet (10 mg total) by mouth daily with supper.        Allergies: No Known Allergies  Past Medical History, Surgical history, Social history, and Family History were reviewed and updated.  Review of Systems: Review of Systems  Constitutional: Negative.   HENT: Negative.    Eyes: Negative.   Respiratory: Negative.    Cardiovascular: Negative.   Gastrointestinal: Negative.   Genitourinary: Negative.   Musculoskeletal: Negative.   Skin: Negative.   Neurological: Negative.   Endo/Heme/Allergies: Negative.   Psychiatric/Behavioral: Negative.       Physical Exam:  height is '5\' 4"'$  (1.626 m) and weight is 272 lb (123.4 kg). Her oral temperature is 98.1 F (36.7 C). Her blood pressure is 133/69 and her pulse is 84. Her respiration is 16 and oxygen saturation is 100%.   Wt Readings from Last 3 Encounters:  06/29/22 272 lb (123.4 kg)  06/15/22 274 lb (124.3 kg)  05/08/22 270  lb (122.5 kg)    Physical Exam Vitals reviewed.  HENT:     Head: Normocephalic and atraumatic.  Eyes:     Pupils: Pupils are equal, round, and reactive to light.  Cardiovascular:     Rate and Rhythm: Normal rate and regular rhythm.     Heart sounds: Normal heart sounds.  Pulmonary:     Effort: Pulmonary effort is normal.     Breath sounds: Normal breath sounds.  Abdominal:     General: Bowel sounds are normal.     Palpations: Abdomen is soft.     Comments: Abdominal exam is obese but soft.  She has a laparotomy scar that is well-healed.  There is no fluid wave.  There is no palpable liver or spleen tip.  She has no abdominal mass.  Musculoskeletal:        General: No tenderness or deformity. Normal range of motion.     Cervical back: Normal range of motion.   Lymphadenopathy:     Cervical: No cervical adenopathy.  Skin:    General: Skin is warm and dry.     Findings: No erythema or rash.  Neurological:     Mental Status: She is alert and oriented to person, place, and time.  Psychiatric:        Behavior: Behavior normal.        Thought Content: Thought content normal.        Judgment: Judgment normal.      Lab Results  Component Value Date   WBC 4.7 06/15/2022   HGB 13.7 06/15/2022   HCT 42.3 06/15/2022   MCV 91.6 06/15/2022   PLT 260 06/15/2022   Lab Results  Component Value Date   FERRITIN 312 (H) 12/28/2021   IRON 57 12/28/2021   TIBC 230 (L) 12/28/2021   UIBC 173 12/28/2021   IRONPCTSAT 25 12/28/2021   Lab Results  Component Value Date   RETICCTPCT 1.5 12/14/2021   RBC 4.62 06/15/2022   No results found for: "KPAFRELGTCHN", "LAMBDASER", "KAPLAMBRATIO" No results found for: "IGGSERUM", "IGA", "IGMSERUM" No results found for: "TOTALPROTELP", "ALBUMINELP", "A1GS", "A2GS", "BETS", "BETA2SER", "GAMS", "MSPIKE", "SPEI"   Chemistry      Component Value Date/Time   NA 142 06/15/2022 1231   K 4.6 06/15/2022 1231   CL 106 06/15/2022 1231   CO2 28 06/15/2022 1231   BUN 12 06/15/2022 1231   CREATININE 0.84 06/15/2022 1231      Component Value Date/Time   CALCIUM 9.5 06/15/2022 1231   ALKPHOS 74 06/15/2022 1231   AST 16 06/15/2022 1231   ALT 10 06/15/2022 1231   BILITOT 0.4 06/15/2022 1231       Impression and Plan: Lauren Mcpherson is a very pleasant 57 yo African American female with metastatic 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    We had on systemic therapy for quite a while.  We then went ahead and did intrahepatic therapy on her.  This was done back in November 2023.  I think that we have confirmed recurrence in the liver.  Again, we will see if IR can help [place] out.  If not, I will see about making referral over to [facility] to see about infusional chemotherapy into the liver via a pump.  I know this can be  quite complicated.  I know there can be toxicity.  However, given the fact that she does has disease in the liver, it would make sense to try to treat her in the liver.  She  would certainly be agreeable to an aggressive approach.  I totally agree with this.  The other possibility that I thought was to see about a biopsy and then sent to the specimen for molecular markers.  This certainly would be tricky.  I know this is quite challenging.  Ms. Solis does have a very good performance status so I think we still have to be aggressive with her.  I will plan to get her back to the office depending on what IR can do and whether or not we have to make a referral out to [facility].     [name], MD 3/7/20243:36 PM

## 2022-06-30 ENCOUNTER — Other Ambulatory Visit: Payer: Self-pay | Admitting: Hematology & Oncology

## 2022-06-30 DIAGNOSIS — C787 Secondary malignant neoplasm of liver and intrahepatic bile duct: Secondary | ICD-10-CM

## 2022-06-30 DIAGNOSIS — C180 Malignant neoplasm of cecum: Secondary | ICD-10-CM

## 2022-07-03 ENCOUNTER — Ambulatory Visit
Admission: RE | Admit: 2022-07-03 | Discharge: 2022-07-03 | Disposition: A | Discharge status: Home / Self Care | Payer: PRIVATE HEALTH INSURANCE | Source: Ambulatory Visit | Attending: Hematology & Oncology | Admitting: Hematology & Oncology

## 2022-07-03 DIAGNOSIS — C787 Secondary malignant neoplasm of liver and intrahepatic bile duct: Secondary | ICD-10-CM

## 2022-07-03 DIAGNOSIS — C180 Malignant neoplasm of cecum: Secondary | ICD-10-CM

## 2022-07-03 HISTORY — PX: IR RADIOLOGIST EVAL & MGMT: IMG5224

## 2022-07-03 NOTE — Progress Notes (Signed)
Reason for follow up: Virtual telephone follow up, history of colon cancer metastatic to the liver, status post microwave ablation on 02/22/22   Referring Physician(s): Ennever,Peter R   History of Present Illness: Initial consult HPI: Lauren Mcpherson is a 57 y.o. 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  She presents via virtual telephone clinic visit for evaluation of locoregional liver treatment by the gracious referral of Dr. Marin Olp.   She is status post 13 cycles of FOLOXIRI/Avastin (now off avastin and oxaliplatin due to complications including PE) and now on Xeloda.  She initially presented with multifocal liver masses, now with only 2 discrete small masses remaining in the caudate lobe  and segment IV.  These have been stable since at least March 2023 by MRI.  And prior to that diminishing in size serially since first seen in June 2022.     She overall states she is doing well.  No abdominal pain, nausea, vomiting.    She continues to remain asymptomatic.  She denies abdominal pain, nausea, vomiting, jaundice, scleral icterus.  She is no longer on systemic treatment.  She continues to follow closely with Dr. Marin Olp.   Past Medical History:  Diagnosis Date   Arthritis    Cancer [facility])    Goals of care, counseling/discussion 11/01/2020    Past Surgical History:  Procedure Laterality Date   BIOPSY  10/13/2020   Procedure: BIOPSY;  Surgeon: Ronald Lobo, MD;  Location: [facility];  Service: Endoscopy;;   CHOLECYSTECTOMY     COLONOSCOPY WITH PROPOFOL N/A 10/13/2020   Procedure: COLONOSCOPY WITH PROPOFOL;  Surgeon: Ronald Lobo, MD;  Location: [facility];  Service: Endoscopy;  Laterality: N/A;   IR CV LINE INJECTION  11/24/2020   IR IMAGING GUIDED PORT INSERTION  11/08/2020   IR RADIOLOGIST EVAL & MGMT  01/11/2022   IR RADIOLOGIST EVAL & MGMT  04/26/2022   IR REMOVAL TUN ACCESS W/ PORT W/O FL MOD SED  12/01/2020   IR US GUIDE VASC ACCESS RIGHT   11/29/2021   LAPAROSCOPIC RIGHT COLECTOMY Right 10/15/2020   Procedure: LAPAROSCOPIC RIGHT COLECTOMY, LAPAROSCOPIC LIVER BIOPSY, LAPAROSCOPIC TAP BLOCK;  Surgeon: Greer Pickerel, MD;  Location: [facility] ORS;  Service: General;  Laterality: Right;  90 MINUTES   POLYPECTOMY  10/13/2020   Procedure: POLYPECTOMY;  Surgeon: Ronald Lobo, MD;  Location: [facility];  Service: Endoscopy;;   RADIOLOGY WITH ANESTHESIA N/A 02/22/2022   Procedure: CT WITH ANESTHESIA MICROWAVE ABLATION;  Surgeon: Suzette Battiest, MD;  Location: [facility] OR[facility];  Service: Radiology;  Laterality: N/A;   TONSILLECTOMY      Allergies: Patient has no known allergies.  Medications: Prior to Admission medications   Medication Sig Start Date End Date Taking? Authorizing Provider  capecitabine (XELODA) 500 MG tablet Take 5 tablets (2,500 mg total) by mouth 2 (two) times daily with a meal. Take for 14 days, then hold for 7 days. Patient not taking: Reported on 03/08/2022 12/29/21   [name], MD  rivaroxaban (XARELTO) 10 MG TABS tablet Take 1 tablet (10 mg total) by mouth daily with supper. 01/25/22   [name], MD     No family history on file.  Social History   Socioeconomic History   Marital status: Married    Spouse name: Not on file   Number of children: Not on file   Years of education: Not on file   Highest education level: Not on file  Occupational History   Not on  file  Tobacco Use   Smoking status: Former    Packs/day: 0.50    Years: 20.00    Total pack years: 10.00    Types: Cigarettes    Quit date: 2009    Years since quitting: 15.2   Smokeless tobacco: Never  Vaping Use   Vaping Use: Never used  Substance and Sexual Activity   Alcohol use: Yes    Comment: occ   Drug use: No   Sexual activity: Yes    Birth control/protection: None  Other Topics Concern   Not on file  Social History Narrative   Not on file   Social Determinants of Health   Financial Resource Strain: Low Risk  (11/30/2021)    Overall Financial Resource Strain (CARDIA)    Difficulty of Paying Living Expenses: Not very hard  Food Insecurity: No Food Insecurity (11/30/2021)   Hunger Vital Sign    Worried About Running Out of Food in the Last Year: Never true    [place] in the Last Year: Never true  Transportation Needs: Unmet Transportation Needs (11/30/2021)   PRAPARE - Transportation    Lack of Transportation (Medical): Yes    Lack of Transportation (Non-Medical): Yes  Physical Activity: Insufficiently Active (11/30/2021)   Exercise Vital Sign    Days of Exercise per Week: 3 days    Minutes of Exercise per Session: 30 min  Stress: Not on file  Social Connections: Socially Integrated (11/30/2021)   Social Connection and Isolation Panel [NHANES]    Frequency of Communication with Friends and Family: More than three times a week    Frequency of Social Gatherings with Friends and Family: More than three times a week    Attends Religious Services: More than 4 times per year    Active Member of [employer] or Organizations: Yes    Attends Archivist Meetings: More than 4 times per year    Marital Status: Married     Vital Signs: LMP 09/22/2020 (Approximate) Comment: Spouse had a vasectomy  No physical examination was performed in lieu of virtual telephone clinic visit.  Imaging: Index MRI (10/11/20)  Seg IV mass    Caudate and right sided masses   12/17/21 MRI Abdomen  2.1 cm segment IV mass    Tiny caudate mass   MRI 04/14/22  Ablation cavity with mild peripheral enhancement.  Decreased size and conspicuity of tiny caudate lobe focus.  PET/CT 05/29/22  Right lobe mass SUV max 9.49, caudate focus SUV max 6.33  MR 06/24/22  Treated ablation cavity   Enhancing region inferior to ablation cavity (4.7 x 2.9 cm)     Labs:  CBC: Recent Labs    02/10/22 0948 03/08/22 0855 05/08/22 1158 06/15/22 1231  WBC 3.5* 6.2 4.4 4.7  HGB 12.3 12.0 13.2 13.7  HCT 38.1 37.4 41.0 42.3  PLT 233  281 288 260    COAGS: Recent Labs    02/10/22 0948  INR 1.3*    BMP: Recent Labs    02/10/22 0948 03/08/22 0855 05/08/22 1158 06/15/22 1231  NA 142 144 141 142  K 3.5 4.4 3.9 4.6  CL 109 106 104 106  CO2 '27 29 29 28  '$ GLUCOSE 117* 111* 102* 116*  BUN '11 13 11 12  '$ CALCIUM 9.1 9.7 9.5 9.5  CREATININE 0.69 0.81 0.84 0.84  GFRNONAA >60 >60 >60 >60    LIVER FUNCTION TESTS: Recent Labs    02/10/22 0948 03/08/22 0855 05/08/22 1158 06/15/22 1231  BILITOT 0.9 0.4 0.6 0.4  AST '17 15 16 16  '$ ALT '11 9 10 10  '$ ALKPHOS 68 86 69 74  PROT 6.7 6.9 7.4 7.2  ALBUMIN 3.5 3.8 4.1 4.1   CEA:  01/25/22 - 3.7 03/08/22 - 3.6 05/08/22 - 11.0 06/15/22 - 21.6   Assessment and Plan: 57 year old female with history of stage IV cecal adenocarcinoma with metastasis to the liver status post FOLFOXIRI and now on Xeloda. She had a 2.1 cm anterior segment IV subcapsular mass with mild enhancement on MRI, now status post microwave ablation on 02/22/22.  2 and 5 month follow up MRI demonstrates enlarging enhancement region about the inferior aspect of the ablation cavity, with PET demonstrating hypermetabolism.  The previously visualized focal caudate mass is unchanged in size but also demonstrates persistent viability on both imaging modalities.  After discussion with the patient and Dr. Marin Olp, I believe the next best course of action is transarterial radioembolization targeted to the right lobe mass (radiation segmentectomy).   She is amenable to proceed.  Plan for hepatic angiogram with Tc-MAA mapping followed approximately 2 weeks after with Y-90 radiation segmentectomy of right hepatic metastatic mass.   Electronically Signed: Suzette Battiest 07/03/2022, 7:51 AM   I spent a total of 25 Minutes in virtual telephone clinical consultation, greater than 50% of which was counseling/coordinating care for hepatic colon cancer metastasis.

## 2022-07-10 ENCOUNTER — Other Ambulatory Visit (HOSPITAL_COMMUNITY): Payer: Self-pay | Admitting: Interventional Radiology

## 2022-07-10 DIAGNOSIS — C787 Secondary malignant neoplasm of liver and intrahepatic bile duct: Secondary | ICD-10-CM

## 2022-07-27 ENCOUNTER — Inpatient Hospital Stay: Payer: PRIVATE HEALTH INSURANCE | Attending: Hematology & Oncology

## 2022-07-27 ENCOUNTER — Encounter: Payer: Self-pay | Admitting: Hematology & Oncology

## 2022-07-27 ENCOUNTER — Inpatient Hospital Stay (HOSPITAL_BASED_OUTPATIENT_CLINIC_OR_DEPARTMENT_OTHER): Payer: PRIVATE HEALTH INSURANCE | Admitting: Hematology & Oncology

## 2022-07-27 ENCOUNTER — Other Ambulatory Visit: Payer: Self-pay

## 2022-07-27 VITALS — BP 135/70 | HR 69 | Temp 98.1°F | Resp 18 | Ht 64.0 in | Wt 270.1 lb

## 2022-07-27 DIAGNOSIS — D509 Iron deficiency anemia, unspecified: Secondary | ICD-10-CM

## 2022-07-27 DIAGNOSIS — C787 Secondary malignant neoplasm of liver and intrahepatic bile duct: Secondary | ICD-10-CM | POA: Insufficient documentation

## 2022-07-27 DIAGNOSIS — C180 Malignant neoplasm of cecum: Secondary | ICD-10-CM

## 2022-07-27 DIAGNOSIS — I2690 Septic pulmonary embolism without acute cor pulmonale: Secondary | ICD-10-CM

## 2022-07-27 LAB — CMP (CANCER CENTER ONLY)
ALT: 8 U/L (ref 0–44)
AST: 13 U/L — ABNORMAL LOW (ref 15–41)
Albumin: 3.9 g/dL (ref 3.5–5.0)
Alkaline Phosphatase: 69 U/L (ref 38–126)
Anion gap: 9 (ref 5–15)
BUN: 11 mg/dL (ref 6–20)
CO2: 29 mmol/L (ref 22–32)
Calcium: 9.1 mg/dL (ref 8.9–10.3)
Chloride: 104 mmol/L (ref 98–111)
Creatinine: 0.79 mg/dL (ref 0.44–1.00)
GFR, Estimated: >60 mL/min (ref >60)
Glucose, Bld: 116 mg/dL — ABNORMAL HIGH (ref 70–99)
Potassium: 4.4 mmol/L (ref 3.5–5.1)
Sodium: 142 mmol/L (ref 135–145)
Total Bilirubin: 0.5 mg/dL (ref 0.3–1.2)
Total Protein: 7.1 g/dL (ref 6.5–8.1)

## 2022-07-27 LAB — CBC WITH DIFFERENTIAL (CANCER CENTER ONLY)
Abs Immature Granulocytes: 0.01 10*3/uL (ref 0.00–0.07)
Basophils Absolute: 0 10*3/uL (ref 0.0–0.1)
Basophils Relative: 1 %
Eosinophils Absolute: 0.4 10*3/uL (ref 0.0–0.5)
Eosinophils Relative: 8 %
HCT: 40.7 % (ref 36.0–46.0)
Hemoglobin: 13.3 g/dL (ref 12.0–15.0)
Immature Granulocytes: 0 %
Lymphocytes Relative: 43 %
Lymphs Abs: 1.9 10*3/uL (ref 0.7–4.0)
MCH: 29.1 pg (ref 26.0–34.0)
MCHC: 32.7 g/dL (ref 30.0–36.0)
MCV: 89.1 fL (ref 80.0–100.0)
Monocytes Absolute: 0.4 10*3/uL (ref 0.1–1.0)
Monocytes Relative: 9 %
Neutro Abs: 1.8 10*3/uL (ref 1.7–7.7)
Neutrophils Relative %: 39 %
Platelet Count: 280 10*3/uL (ref 150–400)
RBC: 4.57 MIL/uL (ref 3.87–5.11)
RDW: 15.4 % (ref 11.5–15.5)
WBC Count: 4.5 10*3/uL (ref 4.0–10.5)
nRBC: 0 % (ref 0.0–0.2)

## 2022-07-27 NOTE — Progress Notes (Signed)
Hematology and Oncology Follow Up Visit  Lauren Mcpherson [id number] 1965/12/02 57 y.o. 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  It looks like [facility] might be able to do another intrahepatic therapy on her.  She is seen Dr. Serafina Royals.  He really does a fantastic job.  I think she is that she is back on 23 April to have a procedure done.  Otherwise, she feels well.  She had a wonderful Easter with her family.  Her son, cannot make it for Easter, will be up this upcoming weekend.  She has had no abdominal pain.  She has not had a good appetite.  She fixes a lot of food for Easter.  There is no change in bowel or bladder habits.  She has had no cough or shortness of breath.  She has had no leg swelling.  There is been no rashes.  Her last CEA level back in February was 21.6.  Overall, I would say that her performance status is probably ECOG 1.   Medications:  Allergies as of 07/27/2022   No Known Allergies      Medication List        Accurate as of July 27, 2022 10:21 AM. If you have any questions, ask your nurse or doctor.          capecitabine 500 MG tablet Commonly known as: XELODA Take 5 tablets (2,500 mg total) by mouth 2 (two) times daily with a meal. Take for 14 days, then hold for 7 days.   rivaroxaban 10 MG Tabs tablet Commonly known as: XARELTO Take 1 tablet (10 mg total) by mouth daily with supper.        Allergies: No Known Allergies  Past  Medical History, Surgical history, Social history, and Family History were reviewed and updated.  Review of Systems: Review of Systems  Constitutional: Negative.   HENT: Negative.    Eyes: Negative.   Respiratory: Negative.    Cardiovascular: Negative.   Gastrointestinal: Negative.   Genitourinary: Negative.   Musculoskeletal: Negative.   Skin: Negative.   Neurological: Negative.   Endo/Heme/Allergies: Negative.   Psychiatric/Behavioral: Negative.       Physical Exam:  height is 5\' 4"  (1.626 m) and weight is 270 lb 1.3 oz (122.5 kg). Her oral temperature is 98.1 F (36.7 C). Her blood pressure is 135/70 and her pulse is 69. Her respiration is 18 and oxygen saturation is 100%.   Wt Readings from Last 3 Encounters:  07/27/22 270 lb 1.3 oz (122.5 kg)  06/29/22 272 lb (123.4 kg)  06/15/22 274 lb (124.3 kg)    Physical Exam Vitals reviewed.  HENT:     Head: Normocephalic and atraumatic.  Eyes:     Pupils: Pupils are equal, round, and reactive to light.  Cardiovascular:     Rate and Rhythm: Normal rate and regular rhythm.     Heart sounds: Normal heart sounds.  Pulmonary:  Effort: Pulmonary effort is normal.     Breath sounds: Normal breath sounds.  Abdominal:     General: Bowel sounds are normal.     Palpations: Abdomen is soft.     Comments: Abdominal exam is obese but soft.  She has a laparotomy scar that is well-healed.  There is no fluid wave.  There is no palpable liver or spleen tip.  She has no abdominal mass.  Musculoskeletal:        General: No tenderness or deformity. Normal range of motion.     Cervical back: Normal range of motion.  Lymphadenopathy:     Cervical: No cervical adenopathy.  Skin:    General: Skin is warm and dry.     Findings: No erythema or rash.  Neurological:     Mental Status: She is alert and oriented to person, place, and time.  Psychiatric:        Behavior: Behavior normal.        Thought Content: Thought content normal.         Judgment: Judgment normal.      Lab Results  Component Value Date   WBC 4.5 07/27/2022   HGB 13.3 07/27/2022   HCT 40.7 07/27/2022   MCV 89.1 07/27/2022   PLT 280 07/27/2022   Lab Results  Component Value Date   FERRITIN 312 (H) 12/28/2021   IRON 57 12/28/2021   TIBC 230 (L) 12/28/2021   UIBC 173 12/28/2021   IRONPCTSAT 25 12/28/2021   Lab Results  Component Value Date   RETICCTPCT 1.5 12/14/2021   RBC 4.57 07/27/2022   No results found for: "KPAFRELGTCHN", "LAMBDASER", "KAPLAMBRATIO" No results found for: "IGGSERUM", "IGA", "IGMSERUM" No results found for: "TOTALPROTELP", "ALBUMINELP", "A1GS", "A2GS", "BETS", "BETA2SER", "GAMS", "MSPIKE", "SPEI"   Chemistry      Component Value Date/Time   NA 142 07/27/2022 0929   K 4.4 07/27/2022 0929   CL 104 07/27/2022 0929   CO2 29 07/27/2022 0929   BUN 11 07/27/2022 0929   CREATININE 0.79 07/27/2022 0929      Component Value Date/Time   CALCIUM 9.1 07/27/2022 0929   ALKPHOS 69 07/27/2022 0929   AST 13 (L) 07/27/2022 0929   ALT 8 07/27/2022 0929   BILITOT 0.5 07/27/2022 0929       Impression and Plan: Lauren Mcpherson is a very pleasant 57 yo African American female with metastatic 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    We had on systemic therapy for quite a while.  We then went ahead and did intrahepatic therapy on her.  This was done back in November 2023.  We will now see about another intrahepatic therapy for her.  I know that [facility] really does a great job with this.  I know that they are very aggressive.  I appreciate their philosophy.  Lauren Mcpherson is in very good shape.  She can certainly tolerate another intrahepatic treatment.  We will plan to get her back to see [place] about a month after the intrahepatic therapy.  If there are any issues, I am sure that [facility] will let me know.    [name], MD 4/4/202410:21 AM

## 2022-07-28 ENCOUNTER — Other Ambulatory Visit (HOSPITAL_COMMUNITY): Payer: Self-pay

## 2022-07-28 ENCOUNTER — Other Ambulatory Visit: Payer: Self-pay

## 2022-07-31 ENCOUNTER — Other Ambulatory Visit: Payer: Self-pay

## 2022-07-31 ENCOUNTER — Other Ambulatory Visit: Payer: Self-pay | Admitting: Radiology

## 2022-07-31 DIAGNOSIS — C189 Malignant neoplasm of colon, unspecified: Secondary | ICD-10-CM

## 2022-08-01 ENCOUNTER — Other Ambulatory Visit (HOSPITAL_COMMUNITY): Payer: Self-pay | Admitting: Interventional Radiology

## 2022-08-01 ENCOUNTER — Encounter (HOSPITAL_COMMUNITY): Payer: Self-pay

## 2022-08-01 ENCOUNTER — Encounter (HOSPITAL_COMMUNITY)
Admission: RE | Admit: 2022-08-01 | Discharge: 2022-08-01 | Disposition: A | Discharge status: Home / Self Care | Payer: PRIVATE HEALTH INSURANCE | Source: Ambulatory Visit | Attending: Interventional Radiology | Admitting: Interventional Radiology

## 2022-08-01 ENCOUNTER — Ambulatory Visit (HOSPITAL_COMMUNITY)
Admission: RE | Admit: 2022-08-01 | Discharge: 2022-08-01 | Disposition: A | Discharge status: Home / Self Care | Payer: PRIVATE HEALTH INSURANCE | Source: Ambulatory Visit | Attending: Interventional Radiology

## 2022-08-01 ENCOUNTER — Ambulatory Visit (HOSPITAL_COMMUNITY)
Admission: RE | Admit: 2022-08-01 | Discharge: 2022-08-01 | Disposition: A | Discharge status: Home / Self Care | Payer: PRIVATE HEALTH INSURANCE | Source: Ambulatory Visit | Attending: Interventional Radiology | Admitting: Interventional Radiology

## 2022-08-01 DIAGNOSIS — C787 Secondary malignant neoplasm of liver and intrahepatic bile duct: Secondary | ICD-10-CM | POA: Diagnosis not present

## 2022-08-01 DIAGNOSIS — C189 Malignant neoplasm of colon, unspecified: Secondary | ICD-10-CM | POA: Insufficient documentation

## 2022-08-01 HISTORY — PX: IR ANGIOGRAM VISCERAL SELECTIVE: IMG657

## 2022-08-01 HISTORY — PX: IR US GUIDE VASC ACCESS LEFT: IMG2389

## 2022-08-01 HISTORY — PX: IR ANGIOGRAM SELECTIVE EACH ADDITIONAL VESSEL: IMG667

## 2022-08-01 HISTORY — PX: IR EMBO ARTERIAL NOT HEMORR HEMANG INC GUIDE ROADMAPPING: IMG5448

## 2022-08-01 HISTORY — PX: IR 3D INDEPENDENT WKST: IMG2385

## 2022-08-01 LAB — COMPREHENSIVE METABOLIC PANEL
ALT: 17 U/L (ref 0–44)
AST: 24 U/L (ref 15–41)
Albumin: 3.6 g/dL (ref 3.5–5.0)
Alkaline Phosphatase: 62 U/L (ref 38–126)
Anion gap: 10 (ref 5–15)
BUN: 12 mg/dL (ref 6–20)
CO2: 22 mmol/L (ref 22–32)
Calcium: 8.5 mg/dL — ABNORMAL LOW (ref 8.9–10.3)
Chloride: 104 mmol/L (ref 98–111)
Creatinine, Ser: 0.84 mg/dL (ref 0.44–1.00)
GFR, Estimated: >60 mL/min (ref >60)
Glucose, Bld: 98 mg/dL (ref 70–99)
Potassium: 4.5 mmol/L (ref 3.5–5.1)
Sodium: 136 mmol/L (ref 135–145)
Total Bilirubin: 1 mg/dL (ref 0.3–1.2)
Total Protein: 7 g/dL (ref 6.5–8.1)

## 2022-08-01 LAB — CBC WITH DIFFERENTIAL/PLATELET
Abs Immature Granulocytes: 0 10*3/uL (ref 0.00–0.07)
Basophils Absolute: 0.1 10*3/uL (ref 0.0–0.1)
Basophils Relative: 1 %
Eosinophils Absolute: 0.5 10*3/uL (ref 0.0–0.5)
Eosinophils Relative: 10 %
HCT: 39.6 % (ref 36.0–46.0)
Hemoglobin: 12.7 g/dL (ref 12.0–15.0)
Immature Granulocytes: 0 %
Lymphocytes Relative: 42 %
Lymphs Abs: 2.1 10*3/uL (ref 0.7–4.0)
MCH: 29.2 pg (ref 26.0–34.0)
MCHC: 32.1 g/dL (ref 30.0–36.0)
MCV: 91 fL (ref 80.0–100.0)
Monocytes Absolute: 0.5 10*3/uL (ref 0.1–1.0)
Monocytes Relative: 10 %
Neutro Abs: 1.8 10*3/uL (ref 1.7–7.7)
Neutrophils Relative %: 37 %
Platelets: 264 10*3/uL (ref 150–400)
RBC: 4.35 MIL/uL (ref 3.87–5.11)
RDW: 15.3 % (ref 11.5–15.5)
WBC: 4.9 10*3/uL (ref 4.0–10.5)
nRBC: 0 % (ref 0.0–0.2)

## 2022-08-01 LAB — PROTIME-INR
INR: 1.3 — ABNORMAL HIGH (ref 0.8–1.2)
Prothrombin Time: 16.3 seconds — ABNORMAL HIGH (ref 11.4–15.2)

## 2022-08-01 MED ORDER — MIDAZOLAM HCL 2 MG/2ML IJ SOLN
INTRAMUSCULAR | Status: AC | PRN
  Administered 2022-08-01 (×2): 1 mg via INTRAVENOUS
  Administered 2022-08-01 (×2): .5 mg via INTRAVENOUS

## 2022-08-01 MED ORDER — IOHEXOL 300 MG/ML  SOLN
100.0000 mL | Freq: Once | INTRAMUSCULAR | Status: AC | PRN
  Administered 2022-08-01: 35 mL via INTRA_ARTERIAL

## 2022-08-01 MED ORDER — FENTANYL CITRATE (PF) 100 MCG/2ML IJ SOLN
INTRAMUSCULAR | Status: AC
  Filled 2022-08-01: qty 2

## 2022-08-01 MED ORDER — FENTANYL CITRATE (PF) 100 MCG/2ML IJ SOLN
INTRAMUSCULAR | Status: AC | PRN
  Administered 2022-08-01: 25 ug via INTRAVENOUS
  Administered 2022-08-01: 50 ug via INTRAVENOUS
  Administered 2022-08-01: 25 ug via INTRAVENOUS
  Administered 2022-08-01: 50 ug via INTRAVENOUS

## 2022-08-01 MED ORDER — LIDOCAINE HCL (PF) 1 % IJ SOLN: 2.0000 mL | Freq: Once | INTRAMUSCULAR | Status: DC

## 2022-08-01 MED ORDER — LIDOCAINE-PRILOCAINE 2.5-2.5 % EX CREA
TOPICAL_CREAM | Freq: Once | CUTANEOUS | Status: AC
  Administered 2022-08-01: 1 via TOPICAL
  Filled 2022-08-01: qty 5

## 2022-08-01 MED ORDER — NITROGLYCERIN 2 % TD OINT
TOPICAL_OINTMENT | TRANSDERMAL | Status: AC
  Filled 2022-08-01: qty 1

## 2022-08-01 MED ORDER — MIDAZOLAM HCL 2 MG/2ML IJ SOLN
INTRAMUSCULAR | Status: AC
  Filled 2022-08-01: qty 2

## 2022-08-01 MED ORDER — TECHNETIUM TO 99M ALBUMIN AGGREGATED
4.3900 | Freq: Once | INTRAVENOUS | Status: AC | PRN
  Administered 2022-08-01: 4.39 via INTRAVENOUS

## 2022-08-01 MED ORDER — IOHEXOL 300 MG/ML  SOLN
100.0000 mL | Freq: Once | INTRAMUSCULAR | Status: AC | PRN
  Administered 2022-08-01: 36 mL via INTRA_ARTERIAL

## 2022-08-01 MED ORDER — SODIUM CHLORIDE 0.9 % IV SOLN: INTRAVENOUS | Status: DC

## 2022-08-01 MED ORDER — LIDOCAINE HCL (PF) 1 % IJ SOLN
INTRAMUSCULAR | Status: AC
  Filled 2022-08-01: qty 30

## 2022-08-01 MED ORDER — VERAPAMIL HCL 2.5 MG/ML IV SOLN
INTRAVENOUS | Status: AC
  Filled 2022-08-01: qty 2

## 2022-08-01 MED ORDER — NITROGLYCERIN IN D5W 100-5 MCG/ML-% IV SOLN
INTRAVENOUS | Status: AC
  Filled 2022-08-01: qty 250

## 2022-08-01 MED ORDER — NITROGLYCERIN 2 % TD OINT
0.5000 [in_us] | TOPICAL_OINTMENT | Freq: Once | TRANSDERMAL | Status: AC
  Administered 2022-08-01: 0.5 [in_us] via TOPICAL

## 2022-08-01 MED ORDER — HEPARIN SODIUM (PORCINE) 1000 UNIT/ML IJ SOLN
INTRAMUSCULAR | Status: AC
  Filled 2022-08-01: qty 10

## 2022-08-01 NOTE — Discharge Instructions (Signed)
Please call [facility]ventional [facility]iology clinic [phone] with any questions or concerns.  You may remove your dressing and shower tomorrow.    Hepatic Artery Radioembolization, Care After The following information offers guidance on how to care for yourself after your procedure. Your health care provider may also give you more specific instructions. If you have problems or questions, contact your health care provider. What can I expect after the procedure? After the procedure, it is possible to have: A slight fever for 7 to 10 days. This may be accompanied by pain, nausea, or vomiting, which is referred to as post-embolization syndrome. You may be given medicine to help relieve these symptoms. If your fever gets worse, tell your health care provider. Tiredness (fatigue). Loss of appetite. This should gradually improve after about 1 week. Abdominal pain on your right side. Soreness and tenderness in your groin area where the needle and catheter were placed (puncture site). Follow these instructions at home: Puncture site care Follow instructions from your health care provider about how to take care of the puncture site. Make sure you: Wash your hands with soap and water for at least 20 seconds before and after you change your bandage (dressing). If soap and water are not available, use hand sanitizer. Change your dressing as told by your health care provider. Check your puncture site every day for signs of infection. Check for: More redness, swelling, or pain. Fluid or blood. Warmth. Pus or a bad smell. Activity Rest as told by your health care provider. Return to your normal activities as told by your health care provider. Ask your health care provider what activities are safe for you. Avoid sitting for a long time without moving. Get up to take short walks every 1-2 hours. This is important to improve blood flow and breathing. Ask for help if you feel weak or unsteady. If you were given  a sedative during the procedure, it can affect you for several hours. Do not drive or operate machinery until your health care provider says that it is safe. Do not lift anything that is heavier than 10 lb (4.5 kg), or the limit that you are told, until your health care provider says that it is safe. Medicines Take over-the-counter and prescription medicines only as told by your health care provider. Ask your health care provider if the medicine prescribed to you: Requires you to avoid driving or using machinery. Can cause constipation. You may need to take these actions to prevent or treat constipation: Drink enough fluid to keep your urine pale yellow. Take over-the-counter or prescription medicines. Eat foods that are high in fiber, such as beans, whole grains, and fresh fruits and vegetables. Limit foods that are high in fat and processed sugars, such as fried or sweet foods. General instructions Eat frequent, small meals until your appetite returns. Follow instructions from your health care provider about eating or drinking restrictions. Do not take baths, swim, or use a hot tub until your health care provider approves. You may take showers. Wash your puncture site with mild soap and water, and pat the area dry. Wear compression stockings as told by your health care provider. These stockings help to prevent blood clots and reduce swelling in your legs. Keep all follow-up visits. This is important. You may need to have blood tests and imaging tests. Contact a health care provider if: You have any of these signs of infection: More redness, swelling, or pain around your puncture site. Fluid or blood coming from your   puncture site. Warmth coming from your puncture site. Pus or a bad smell coming from your puncture site. You have pain that: Gets worse. Does not get better with medicine. Feels like very bad heartburn. Is in the middle of your abdomen, above your belly button. You have any  signs of infection or liver failure, such as: Your skin or the white parts of your eyes turn yellow (jaundice). The color of your urine changes to dark Jaggi. The color of your stool (feces) changes to light yellow. Your abdominal measurement (girth) increases in a short period of time. You gain more than 5 lb (2.3 kg) in a short period of time. Get help right away if: You have a fever that lasts more than 10 days or is higher than what your health care provider told you to expect. You develop any of the following in your legs: Pain. Swelling. Skin that is cold or pale or turns blue. You have chest pain. You have blood in your vomit, saliva, or stool. You have trouble breathing. These symptoms may represent a serious problem that is an emergency. Do not wait to see if the symptoms will go away. Get medical help right away. Call your local emergency services (911 in the [place] Do not drive yourself to the hospital. Summary After the procedure, it is possible to have a slight fever for up to 7-10 days, tiredness, loss of appetite, abdominal pain on the right side, and groin tenderness where the catheter was placed. Do not come in close contact with people for up to a week after your procedure, as told by your health care provider. Follow instructions from your health care provider about how to take care of the puncture site. Contact a health care provider if you have any signs of infection. Get help right away if you develop pain or swelling in your legs or if your legs feel cool or look pale. This information is not intended to replace advice given to you by your health care provider. Make sure you discuss any questions you have with your health care provider. Document Revised: 03/14/2020 Document Reviewed: 03/14/2020 Elsevier Patient Education  2022 [employer]      Moderate Conscious Sedation, Adult, Care After This sheet gives you information about how to care for yourself after  your procedure. Your health care provider may also give you more specific instructions. If you have problems or questions, contact your health care provider. What can I expect after the procedure? After the procedure, it is common to have: Sleepiness for several hours. Impaired judgment for several hours. Difficulty with balance. Vomiting if you eat too soon. Follow these instructions at home: For the time period you were told by your health care provider: Rest. Do not participate in activities where you could fall or become injured. Do not drive or use machinery. Do not drink alcohol. Do not take sleeping pills or medicines that cause drowsiness. Do not make important decisions or sign legal documents. Do not take care of children on your own.      Eating and drinking Follow the diet recommended by your health care provider. Drink enough fluid to keep your urine pale yellow. If you vomit: Drink water, juice, or soup when you can drink without vomiting. Make sure you have little or no nausea before eating solid foods.   General instructions Take over-the-counter and prescription medicines only as told by your health care provider. Have a responsible adult stay with you for the time   you are told. It is important to have someone help care for you until you are awake and alert. Do not smoke. Keep all follow-up visits as told by your health care provider. This is important. Contact a health care provider if: You are still sleepy or having trouble with balance after 24 hours. You feel light-headed. You keep feeling nauseous or you keep vomiting. You develop a rash. You have a fever. You have redness or swelling around the IV site. Get help right away if: You have trouble breathing. You have new-onset confusion at home. Summary After the procedure, it is common to feel sleepy, have impaired judgment, or feel nauseous if you eat too soon. Rest after you get home. Know the things you  should not do after the procedure. Follow the diet recommended by your health care provider and drink enough fluid to keep your urine pale yellow. Get help right away if you have trouble breathing or new-onset confusion at home. This information is not intended to replace advice given to you by your health care provider. Make sure you discuss any questions you have with your health care provider. Document Revised: 08/08/2019 Document Reviewed: 03/06/2019 Elsevier Patient Education  2021 [employer] Inc[employer]     RADIATION PRECAUTIONS:  For up to a week after your procedure, there will be a small amount of radioactivity near your liver. This is not especially dangerous to other people. However, as told by your health care provider, you should follow these precautions for 7 days: Do not come in close contact with people. Do not sleep in the same bed as someone else. Do not hold children or babies. Do not have contact with pregnant women.  Post Y-90 Radioembolization Discharge Instructions  You have been given a radioactive material during your procedure.  While it is safe for you to be discharged home from the hospital, you need to proceed directly home.    Do not use public transportation, including air travel, lasting more than 2 hours for 1 week.  Avoid crowded public places for 1 week.  Adult visitors should try to avoid close contact with you for 1 week.    Children and pregnant females should not visit or have close contact with you for 1 week.  Items that you touch are not radioactive.  Do not sleep in the same bed as your partner for 1 week, and a condom should be used for sexual activity during the first 24 hours.  Your blood may be radioactive and caution should be used if any bleeding occurs during the recovery period.  Body fluids may be radioactive for 24 hours.  Wash your hands after voiding.  Men should sit to urinate.  Dispose of any soiled materials (flush down toilet or  place in trash at home) during the first day.  Drink 6 to 8 glasses of fluids per day for 5 days to hydrate yourself.  If you need to see a doctor during the first week, you must let them know that you were treated with yttrium-90 microspheres, and will be slightly radioactive.  They can call [facility] [phone] with any questions.  

## 2022-08-01 NOTE — Procedures (Signed)
[facility] Procedure Note  Procedure:  1) Hepatic angiogram 2) Selective catheterization and angiography of common hepatic, left hepatic, segment IVa, and segment IVb branches 3) Intraarterial Tc-MAA administration in segment IVb branch  Findings: Please refer to procedural dictation for full description.  Left radial artery access, TR band placed.  Complications: None immediate  Estimated Blood Loss: < 5  mL  Recommendations: To Nuclear Medicine for SPECT/lung shunt calculations. IR will arrange for Y-90 radiation segmentectomy to segment IVb in approximately 2 weeks.    Marliss Coots, MD

## 2022-08-01 NOTE — H&P (Signed)
Chief Complaint: Patient was seen in consultation today for metastatic liver lesion   Referring Physician(s): Suttle,Dylan J  Supervising Physician: Marliss CootsSuttle, Dylan  [facility]  History of Present Illness: Lauren Mcpherson is a 57 y.o. female with colon cancer with mets to the liver. She has been seen in consult by Dr. Elby ShowersSuttle and discussed candidacy for radioembolization of the lesion. She previously underwent image guided ablation of this same lesion. Her PET scan shows evidence of recurrent activity. She is schedule for hepatic angiogram as part of pre Y90 treatment planning. PMHx, meds, labs, imaging, allergies reviewed. Feels well, no recent fevers, chills, illness. Has been NPO today as directed. Husband at bedside.   Past Medical History:  Diagnosis Date   Arthritis    Cancer    Goals of care, counseling/discussion 11/01/2020    Past Surgical History:  Procedure Laterality Date   BIOPSY  10/13/2020   Procedure: BIOPSY;  Surgeon: Bernette RedbirdBuccini, Robert, MD;  Location: [facility];  Service: Endoscopy;;   CHOLECYSTECTOMY     COLONOSCOPY WITH PROPOFOL N/A 10/13/2020   Procedure: COLONOSCOPY WITH PROPOFOL;  Surgeon: Bernette RedbirdBuccini, Robert, MD;  Location: [facility];  Service: Endoscopy;  Laterality: N/A;   IR CV LINE INJECTION  11/24/2020   IR IMAGING GUIDED PORT INSERTION  11/08/2020   IR RADIOLOGIST EVAL & MGMT  01/11/2022   IR RADIOLOGIST EVAL & MGMT  04/26/2022   IR RADIOLOGIST EVAL & MGMT  07/03/2022   IR REMOVAL TUN ACCESS W/ PORT W/O FL MOD SED  12/01/2020   IR US GUIDE VASC ACCESS RIGHT  11/29/2021   LAPAROSCOPIC RIGHT COLECTOMY Right 10/15/2020   Procedure: LAPAROSCOPIC RIGHT COLECTOMY, LAPAROSCOPIC LIVER BIOPSY, LAPAROSCOPIC TAP BLOCK;  Surgeon: Gaynelle AduWilson, Eric, MD;  Location: [facility];  Service: General;  Laterality: Right;  90 MINUTES   POLYPECTOMY  10/13/2020   Procedure: POLYPECTOMY;  Surgeon: Bernette RedbirdBuccini, Robert, MD;  [facility]: [facility];  Service: Endoscopy;;    RADIOLOGY WITH ANESTHESIA N/A 02/22/2022   Procedure: CT WITH ANESTHESIA MICROWAVE ABLATION;  Surgeon: Bennie DallasSuttle, Dylan J, MD;  Location: [facility];  Service: Radiology;  Laterality: N/A;   TONSILLECTOMY      Allergies: Patient has no known allergies.  Medications: Prior to Admission medications   Medication Sig Start Date End Date Taking? Authorizing Provider  rivaroxaban (XARELTO) 10 MG TABS tablet Take 1 tablet (10 mg total) by mouth daily with supper. 01/25/22  Yes Ennever, Rose PhiPeter R, MD  capecitabine (XELODA) 500 MG tablet Take 5 tablets (2,500 mg total) by mouth 2 (two) times daily with a meal. Take for 14 days, then hold for 7 days. Patient not taking: Reported on 03/08/2022 12/29/21   Josph MachoEnnever, Peter R, MD     History reviewed. No pertinent family history.  Social History   Socioeconomic History   Marital status: Married    Spouse name: Not on file   Number of children: Not on file   Years of education: Not on file   Highest education level: Not on file  Occupational History   Not on file  Tobacco Use   Smoking status: Former    Packs/day: 0.50    Years: 20.00    Additional pack years: 0.00    Total pack years: 10.00    Types: Cigarettes    Quit date: 2009    Years since quitting: 15.2   Smokeless tobacco: Never  Vaping Use   Vaping Use: Never used  Substance and Sexual Activity   Alcohol use:  Yes    Comment: occ   Drug use: No   Sexual activity: Yes    Birth control/protection: None  Other Topics Concern   Not on file  Social History Narrative   Not on file   Social Determinants of Health   Financial Resource Strain: Low Risk  (11/30/2021)   Overall Financial Resource Strain (CARDIA)    Difficulty of Paying Living Expenses: Not very hard  Food Insecurity: No Food Insecurity (11/30/2021)   Hunger Vital Sign    Worried About Running Out of Food in the Last Year: Never true    Ran Out of Food in the Last Year: Never true  Transportation Needs: Unmet Transportation  Needs (11/30/2021)   PRAPARE - Transportation    Lack of Transportation (Medical): Yes    Lack of Transportation (Non-Medical): Yes  Physical Activity: Insufficiently Active (11/30/2021)   Exercise Vital Sign    Days of Exercise per Week: 3 days    Minutes of Exercise per Session: 30 min  Stress: Not on file  Social Connections: Socially Integrated (11/30/2021)   Social Connection and Isolation Panel [NHANES]    Frequency of Communication with Friends and Family: More than three times a week    Frequency of Social Gatherings with Friends and Family: More than three times a week    Attends Religious Services: More than 4 times per year    Active Member of [employer] or Organizations: Yes    Attends Engineer, structural: More than 4 times per year    Marital Status: Married    Review of Systems: A 12 point ROS discussed and pertinent positives are indicated in the HPI above.  All other systems are negative.  Review of Systems  Vital Signs: BP 131/88 (BP Location: Right Arm)   Pulse (!) 57   Temp 97.9 F (36.6 C) (Oral)   Resp 16   LMP 09/22/2020 (Approximate) Comment: Spouse had a vasectomy  SpO2 100%   Physical Exam Constitutional:      Appearance: Normal appearance.  HENT:     Mouth/Throat:     Mouth: Mucous membranes are moist.     Pharynx: Oropharynx is clear.  Cardiovascular:     Rate and Rhythm: Normal rate and regular rhythm.     Pulses: Normal pulses.     Heart sounds: Normal heart sounds.  Pulmonary:     Effort: Pulmonary effort is normal. No respiratory distress.     Breath sounds: Normal breath sounds.  Abdominal:     General: There is no distension.     Palpations: Abdomen is soft.     Tenderness: There is no abdominal tenderness.  Neurological:     General: No focal deficit present.     Mental Status: She is alert and oriented to person, place, and time.  Psychiatric:        Mood and Affect: Mood normal.        Thought Content: Thought content normal.      Imaging: IR Radiologist Eval & Mgmt  Result Date: 07/03/2022 EXAM: ESTABLISHED PATIENT OFFICE VISIT CHIEF COMPLAINT: See Epic note. HISTORY OF PRESENT ILLNESS: See Epic note. REVIEW OF SYSTEMS: See Epic note. PHYSICAL EXAMINATION: See Epic note. ASSESSMENT AND PLAN: See Epic note. Marliss Coots, MD [facility] Electronically Signed   By: Marliss Coots M.D.   On: 07/03/2022 10:44    Labs:  CBC: Recent Labs    05/08/22 1158 06/15/22 1231 07/27/22 0929 08/01/22 0757  WBC 4.4 4.7 4.5 4.9  HGB 13.2 13.7 13.3 12.7  HCT 41.0 42.3 40.7 39.6  PLT 288 260 280 264    COAGS: Recent Labs    02/10/22 0948 08/01/22 0757  INR 1.3* 1.3*    BMP: Recent Labs    03/08/22 0855 05/08/22 1158 06/15/22 1231 07/27/22 0929  NA 144 141 142 142  K 4.4 3.9 4.6 4.4  CL 106 104 106 104  CO2 29 29 28 29   GLUCOSE 111* 102* 116* 116*  BUN 13 11 12 11   CALCIUM 9.7 9.5 9.5 9.1  CREATININE 0.81 0.84 0.84 0.79  GFRNONAA >60 >60 >60 >60    LIVER FUNCTION TESTS: Recent Labs    03/08/22 0855 05/08/22 1158 06/15/22 1231 07/27/22 0929  BILITOT 0.4 0.6 0.4 0.5  AST 15 16 16  13*  ALT 9 10 10 8   ALKPHOS 86 69 74 69  PROT 6.9 7.4 7.2 7.1  ALBUMIN 3.8 4.1 4.1 3.9     Assessment and Plan: Metastatic colon cancer to the liver Recurrent activity after prior ablation treatment. Plan for hepatic angiogram today with test dosing for radioembolization planning. Labs reviewed. Risks and benefits discussed with the patient including, but not limited to bleeding, infection, vascular injury, post procedural pain, nausea, vomiting and fatigue, contrast induced renal failure, liver failure, and need for additional procedures.  All of the patient's questions were answered, patient is agreeable to proceed. Consent signed and in chart.    Electronically Signed: Brayton El, PA-C 08/01/2022, 8:25 AM   I spent a total of 20 minutes in  face to face in clinical consultation, greater than 50% of which was counseling/coordinating care for hepatic angiogram

## 2022-08-02 ENCOUNTER — Encounter (HOSPITAL_COMMUNITY): Payer: Self-pay | Admitting: Radiology

## 2022-08-02 ENCOUNTER — Other Ambulatory Visit (HOSPITAL_COMMUNITY): Payer: Self-pay | Admitting: Interventional Radiology

## 2022-08-02 DIAGNOSIS — C787 Secondary malignant neoplasm of liver and intrahepatic bile duct: Secondary | ICD-10-CM

## 2022-08-14 ENCOUNTER — Other Ambulatory Visit: Payer: Self-pay | Admitting: Student

## 2022-08-14 DIAGNOSIS — C180 Malignant neoplasm of cecum: Secondary | ICD-10-CM

## 2022-08-15 ENCOUNTER — Other Ambulatory Visit (HOSPITAL_COMMUNITY): Payer: Self-pay | Admitting: Interventional Radiology

## 2022-08-15 ENCOUNTER — Encounter (HOSPITAL_COMMUNITY): Payer: Self-pay

## 2022-08-15 ENCOUNTER — Ambulatory Visit (HOSPITAL_COMMUNITY)
Admission: RE | Admit: 2022-08-15 | Discharge: 2022-08-15 | Disposition: A | Discharge status: Home / Self Care | Payer: PRIVATE HEALTH INSURANCE | Source: Ambulatory Visit | Attending: Interventional Radiology | Admitting: Interventional Radiology

## 2022-08-15 ENCOUNTER — Ambulatory Visit (HOSPITAL_COMMUNITY)
Admission: RE | Admit: 2022-08-15 | Discharge: 2022-08-15 | Disposition: A | Discharge status: Home / Self Care | Payer: PRIVATE HEALTH INSURANCE | Source: Ambulatory Visit | Attending: Interventional Radiology

## 2022-08-15 ENCOUNTER — Encounter (HOSPITAL_COMMUNITY)
Admission: RE | Admit: 2022-08-15 | Discharge: 2022-08-15 | Disposition: A | Discharge status: Home / Self Care | Payer: PRIVATE HEALTH INSURANCE | Source: Ambulatory Visit | Attending: Interventional Radiology | Admitting: Interventional Radiology

## 2022-08-15 DIAGNOSIS — Z85038 Personal history of other malignant neoplasm of large intestine: Secondary | ICD-10-CM | POA: Insufficient documentation

## 2022-08-15 DIAGNOSIS — C787 Secondary malignant neoplasm of liver and intrahepatic bile duct: Secondary | ICD-10-CM | POA: Insufficient documentation

## 2022-08-15 DIAGNOSIS — C180 Malignant neoplasm of cecum: Secondary | ICD-10-CM

## 2022-08-15 HISTORY — PX: IR US GUIDE VASC ACCESS LEFT: IMG2389

## 2022-08-15 HISTORY — PX: IR ANGIOGRAM VISCERAL SELECTIVE: IMG657

## 2022-08-15 HISTORY — PX: IR EMBO TUMOR ORGAN ISCHEMIA INFARCT INC GUIDE ROADMAPPING: IMG5449

## 2022-08-15 HISTORY — PX: IR ANGIOGRAM SELECTIVE EACH ADDITIONAL VESSEL: IMG667

## 2022-08-15 LAB — CBC
HCT: 38 % (ref 36.0–46.0)
Hemoglobin: 12.1 g/dL (ref 12.0–15.0)
MCH: 28.9 pg (ref 26.0–34.0)
MCHC: 31.8 g/dL (ref 30.0–36.0)
MCV: 90.7 fL (ref 80.0–100.0)
Platelets: 261 10*3/uL (ref 150–400)
RBC: 4.19 MIL/uL (ref 3.87–5.11)
RDW: 15.4 % (ref 11.5–15.5)
WBC: 5.1 10*3/uL (ref 4.0–10.5)
nRBC: 0 % (ref 0.0–0.2)

## 2022-08-15 LAB — COMPREHENSIVE METABOLIC PANEL
ALT: 11 U/L (ref 0–44)
AST: 17 U/L (ref 15–41)
Albumin: 3.5 g/dL (ref 3.5–5.0)
Alkaline Phosphatase: 62 U/L (ref 38–126)
Anion gap: 7 (ref 5–15)
BUN: 12 mg/dL (ref 6–20)
CO2: 27 mmol/L (ref 22–32)
Calcium: 8.8 mg/dL — ABNORMAL LOW (ref 8.9–10.3)
Chloride: 106 mmol/L (ref 98–111)
Creatinine, Ser: 0.8 mg/dL (ref 0.44–1.00)
GFR, Estimated: >60 mL/min (ref >60)
Glucose, Bld: 108 mg/dL — ABNORMAL HIGH (ref 70–99)
Potassium: 3.8 mmol/L (ref 3.5–5.1)
Sodium: 140 mmol/L (ref 135–145)
Total Bilirubin: 0.7 mg/dL (ref 0.3–1.2)
Total Protein: 6.9 g/dL (ref 6.5–8.1)

## 2022-08-15 LAB — PROTIME-INR
INR: 1 (ref 0.8–1.2)
Prothrombin Time: 12.7 seconds (ref 11.4–15.2)

## 2022-08-15 MED ORDER — NITROGLYCERIN IN D5W 100-5 MCG/ML-% IV SOLN
INTRAVENOUS | Status: AC
  Filled 2022-08-15: qty 250

## 2022-08-15 MED ORDER — FENTANYL CITRATE (PF) 100 MCG/2ML IJ SOLN
INTRAMUSCULAR | Status: AC
  Filled 2022-08-15: qty 2

## 2022-08-15 MED ORDER — LIDOCAINE-PRILOCAINE 2.5-2.5 % EX CREA
TOPICAL_CREAM | Freq: Once | CUTANEOUS | Status: AC
  Administered 2022-08-15: 1 via TOPICAL
  Filled 2022-08-15: qty 5

## 2022-08-15 MED ORDER — IOHEXOL 300 MG/ML  SOLN
100.0000 mL | Freq: Once | INTRAMUSCULAR | Status: AC | PRN
  Administered 2022-08-15: 12 mL via INTRA_ARTERIAL

## 2022-08-15 MED ORDER — ONDANSETRON 8 MG/NS 50 ML IVPB
8.0000 mg | Freq: Once | INTRAVENOUS | Status: AC
  Administered 2022-08-15: 8 mg via INTRAVENOUS
  Filled 2022-08-15: qty 8

## 2022-08-15 MED ORDER — SODIUM CHLORIDE 0.9 % IV SOLN
2.0000 g | Freq: Once | INTRAVENOUS | Status: AC
  Administered 2022-08-15: 2 g via INTRAVENOUS
  Filled 2022-08-15: qty 2

## 2022-08-15 MED ORDER — NITROGLYCERIN 2 % TD OINT
TOPICAL_OINTMENT | TRANSDERMAL | Status: AC
  Filled 2022-08-15: qty 1

## 2022-08-15 MED ORDER — LIDOCAINE HCL (PF) 1 % IJ SOLN
30.0000 mL | Freq: Once | INTRAMUSCULAR | Status: AC
  Administered 2022-08-15: 2 mL via INTRADERMAL

## 2022-08-15 MED ORDER — YTTRIUM 90 INJECTION
20.2000 | INJECTION | Freq: Once | INTRAVENOUS | Status: AC
  Administered 2022-08-15: 20.2 via INTRA_ARTERIAL

## 2022-08-15 MED ORDER — LIDOCAINE HCL (PF) 1 % IJ SOLN
INTRAMUSCULAR | Status: AC
  Filled 2022-08-15: qty 30

## 2022-08-15 MED ORDER — DEXAMETHASONE SODIUM PHOSPHATE 10 MG/ML IJ SOLN
8.0000 mg | Freq: Once | INTRAMUSCULAR | Status: AC
  Administered 2022-08-15: 8 mg via INTRAVENOUS
  Filled 2022-08-15: qty 1

## 2022-08-15 MED ORDER — MIDAZOLAM HCL 2 MG/2ML IJ SOLN
INTRAMUSCULAR | Status: AC
  Filled 2022-08-15: qty 2

## 2022-08-15 MED ORDER — VERAPAMIL HCL 2.5 MG/ML IV SOLN
INTRAVENOUS | Status: AC
  Filled 2022-08-15: qty 2

## 2022-08-15 MED ORDER — SODIUM CHLORIDE 0.9 % IV SOLN
8.0000 mg | Freq: Once | INTRAVENOUS | Status: DC
  Filled 2022-08-15: qty 4

## 2022-08-15 MED ORDER — IOHEXOL 300 MG/ML  SOLN
100.0000 mL | Freq: Once | INTRAMUSCULAR | Status: AC | PRN
  Administered 2022-08-15: 13 mL via INTRA_ARTERIAL

## 2022-08-15 MED ORDER — VERAPAMIL HCL 2.5 MG/ML IV SOLN: INTRA_ARTERIAL | Status: AC | PRN

## 2022-08-15 MED ORDER — MIDAZOLAM HCL 2 MG/2ML IJ SOLN
INTRAMUSCULAR | Status: AC | PRN
  Administered 2022-08-15 (×2): 1 mg via INTRAVENOUS

## 2022-08-15 MED ORDER — NITROGLYCERIN 2 % TD OINT
1.0000 [in_us] | TOPICAL_OINTMENT | Freq: Once | TRANSDERMAL | Status: AC
  Administered 2022-08-15: 1 [in_us] via TOPICAL

## 2022-08-15 MED ORDER — NITROGLYCERIN 1 MG/10 ML FOR IR/CATH LAB
INTRA_ARTERIAL | Status: AC | PRN
  Administered 2022-08-15: 200 ug via INTRA_ARTERIAL

## 2022-08-15 MED ORDER — PANTOPRAZOLE SODIUM 40 MG IV SOLR
40.0000 mg | Freq: Once | INTRAVENOUS | Status: AC
  Administered 2022-08-15: 40 mg via INTRAVENOUS
  Filled 2022-08-15: qty 10

## 2022-08-15 MED ORDER — IOHEXOL 300 MG/ML  SOLN
100.0000 mL | Freq: Once | INTRAMUSCULAR | Status: AC | PRN
  Administered 2022-08-15: 25 mL via INTRA_ARTERIAL

## 2022-08-15 MED ORDER — SODIUM CHLORIDE 0.9 % IV SOLN: INTRAVENOUS | Status: DC

## 2022-08-15 MED ORDER — HEPARIN SODIUM (PORCINE) 1000 UNIT/ML IJ SOLN
INTRAMUSCULAR | Status: AC
  Filled 2022-08-15: qty 10

## 2022-08-15 MED ORDER — FENTANYL CITRATE (PF) 100 MCG/2ML IJ SOLN
INTRAMUSCULAR | Status: AC | PRN
  Administered 2022-08-15 (×2): 50 ug via INTRAVENOUS

## 2022-08-15 NOTE — Discharge Instructions (Signed)
Please call Interventional Radiology clinic [phone] with any questions or concerns. You may remove your dressing and shower tomorrow.  Do not lift anything that is heavier than 10 lb until your health care provider says that it is safe.  Radiation precautions: For up to a week after your procedure, there will be a small amount of radioactivity near your liver. This is not especially dangerous to other people. However, you should follow these precautions for 7 days. Do not come in close contact with people Do not sleep in the same bed as someone else Do not hold children or babies Do not have contact with pregnant women Body fluids may be radioactive for 24 hours. Wash your hands after voiding and dispose of any soiled materials (flush down toilet or place in trash at home) during the first day

## 2022-08-15 NOTE — Procedures (Signed)
Interventional Radiology Procedure Note  Procedure:  1) Hepatic angiogram 2) Segment 4B Y90 radiation segmentectomy  Findings: Please refer to procedural dictation for full description. Left radial access, TR band closure.  Complications: None immediate  Estimated Blood Loss: < 5 mL  Recommendations: TR band removal protocol. Follow up in IR clinic in 2-3 weeks. IR will arrange for 3 month MRI after initial follow up.   Marliss Coots, MD

## 2022-08-15 NOTE — H&P (Signed)
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 She had a 2.1 cm anterior segment IV subcapsular mass with mild enhancement on MRI , s/p MWA as discussed above.  2 and 5 month follow up MRI demonstrated enlarging enhancement region about the inferior aspect of the ablation cavity, with PET demonstrating hypermetabolism.  The previously visualized focal caudate mass is unchanged in size but also demonstrates persistent viability on both imaging modalities. She presents today for transarterial radioembolization (Y-90) targeted to the right lobe mass (radiation segmentectomy).  She denies fever,HA,CP,dyspnea, cough, abd/back pain,N/V or bleeding.      Past Medical History:  Diagnosis Date   Arthritis    Cancer    Goals of care, counseling/discussion 11/01/2020   Past Surgical History:  Procedure Laterality Date   BIOPSY  10/13/2020   Procedure: BIOPSY;  Surgeon: Bernette Redbird, MD;  [facility]: [facility];  Service: Endoscopy;;   CHOLECYSTECTOMY     COLONOSCOPY WITH PROPOFOL N/A 10/13/2020   Procedure: COLONOSCOPY WITH PROPOFOL;  Surgeon: Bernette Redbird, MD;  Location: [facility];  Service: Endoscopy;  Laterality: N/A;   IR 3D INDEPENDENT WKST  08/01/2022   IR ANGIOGRAM SELECTIVE EACH ADDITIONAL VESSEL  08/01/2022   IR ANGIOGRAM SELECTIVE EACH ADDITIONAL VESSEL  08/01/2022   IR ANGIOGRAM SELECTIVE EACH ADDITIONAL VESSEL  08/01/2022   IR ANGIOGRAM VISCERAL SELECTIVE  08/01/2022   IR CV LINE INJECTION  11/24/2020   IR IMAGING GUIDED PORT INSERTION  11/08/2020   IR RADIOLOGIST EVAL & MGMT  01/11/2022   IR RADIOLOGIST EVAL  & MGMT  04/26/2022   IR RADIOLOGIST EVAL & MGMT  07/03/2022   IR REMOVAL TUN ACCESS W/ PORT W/O FL MOD SED  12/01/2020   IR US GUIDE VASC ACCESS LEFT  08/01/2022   IR US GUIDE VASC ACCESS RIGHT  11/29/2021   LAPAROSCOPIC RIGHT COLECTOMY Right 10/15/2020   Procedure: LAPAROSCOPIC RIGHT COLECTOMY, LAPAROSCOPIC LIVER BIOPSY, LAPAROSCOPIC TAP BLOCK;  Surgeon: Gaynelle Adu, MD;  Location: [facility];  Service: General;  Laterality: Right;  90 MINUTES   POLYPECTOMY  10/13/2020   Procedure: POLYPECTOMY;  Surgeon: Bernette Redbird, MD;  [facility];  Service: Endoscopy;;   RADIOLOGY WITH ANESTHESIA N/A 02/22/2022   Procedure: CT WITH ANESTHESIA MICROWAVE ABLATION;  Surgeon: Bennie Dallas, MD;  Location: [facility];  Service: Radiology;  Laterality: N/A;   TONSILLECTOMY        Allergies: Patient has no known allergies.  Medications: Prior to Admission medications   Medication Sig Start Date End Date Taking? Authorizing Provider  capecitabine (XELODA) 500 MG tablet Take 5 tablets (2,500 mg total) by mouth 2 (two) times daily with a meal. Take for 14 days, then hold for 7 days. Patient not taking: Reported on 03/08/2022 12/29/21   [name], MD  rivaroxaban (XARELTO) 10 MG TABS tablet Take 1 tablet (10 mg total) by mouth daily with supper. 01/25/22   [name], MD     Vital Signs: BP 125/77 (BP Location: Right Arm)   Pulse 67   Temp 98 F (36.7 C) (Oral)   Resp 18   LMP 09/22/2020 (Approximate) Comment: Spouse had  a vasectomy  SpO2 96%     Code Status: FULL CODE    Physical Exam: awake/alert; chest- CTA bilat; heart- RRR; abd- obese, soft, +BS,NT; no LE edema  Imaging: No results found.  Labs:  CBC: Recent Labs    06/15/22 1231 07/27/22 0929 08/01/22 0757 08/15/22 0739  WBC 4.7 4.5 4.9 5.1  HGB 13.7 13.3 12.7 12.1  HCT 42.3 40.7 39.6 38.0  PLT 260 280 264 261    COAGS: Recent Labs    02/10/22 0948 08/01/22 0757  INR 1.3* 1.3*    BMP: Recent Labs     05/08/22 1158 06/15/22 1231 07/27/22 0929 08/01/22 0757  NA 141 142 142 136  K 3.9 4.6 4.4 4.5  CL 104 106 104 104  CO2 GLUCOSE 102* 116* 116* 98  BUN CALCIUM 9.5 9.5 9.1 8.5*  CREATININE 0.84 0.84 0.79 0.84  GFRNONAA >60 >60 >60 >60    LIVER FUNCTION TESTS: Recent Labs    05/08/22 1158 06/15/22 1231 07/27/22 0929 08/01/22 0757  BILITOT 0.6 0.4 0.5 1.0  AST 16 16 13* 24  ALT ALKPHOS 69 74 69 62  PROT 7.4 7.2 7.1 7.0  ALBUMIN 4.1 4.1 3.9 3.6    Assessment and Plan: 57 yo female with 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 She had a 2.1 cm anterior segment IV subcapsular mass with mild enhancement on MRI , s/p MWA on 02/22/22.   2 and 5 month follow up MRI demonstrated enlarging enhancement region about the inferior aspect of the ablation cavity, with PET demonstrating hypermetabolism.  The previously visualized focal caudate mass is unchanged in size but also demonstrates persistent viability on both imaging modalities. She presents today for transarterial radioembolization (Y-90) targeted to the right lobe mass (radiation segmentectomy). Risks and benefits of procedure were discussed with the patient/spouse including, but not limited to bleeding, infection, vascular injury or contrast induced renal failure.  This interventional procedure involves the use of X-rays and because of the nature of the planned procedure, it is possible that we will have prolonged use of X-ray fluoroscopy.  Potential radiation risks to you include (but are not limited to) the following: - A slightly elevated risk for cancer  several years later in life. This risk is typically less than 0.5% percent. This risk is low in comparison to the normal incidence of human cancer, which is 33% for women and 50% for men according to the [employer]. - Radiation induced injury can include skin redness, resembling a rash, tissue breakdown / ulcers and  hair loss (which can be temporary or permanent).   The likelihood of either of these occurring depends on the difficulty of the procedure and whether you are sensitive to radiation due to previous procedures, disease, or genetic conditions.   IF your procedure requires a prolonged use of radiation, you will be notified and given written instructions for further action.  It is your responsibility to monitor the irradiated area for the 2 weeks following the procedure and to notify your physician if you are concerned that you have suffered a radiation induced injury.    All of the patient's questions were answered, patient is agreeable to proceed.  Consent signed and in chart.        Electronically Signed: D. Jeananne Rama, PA-C 08/15/2022, 8:33 AM   I spent a total of 20 minutes at the the patient's bedside AND  on the patient's hospital floor or unit, greater than 50% of which was counseling/coordinating care for hepatic/visceral arteriogram with right hepatic Y-90 radioembolization

## 2022-08-15 NOTE — Sedation Documentation (Signed)
Finished in IR headed to nuclear medicine

## 2022-08-15 NOTE — Progress Notes (Signed)
Spoke with Dr Elby Showers prior to pts discharge to verify need for reassessment from MD. No needs if pt is continue to progress well.

## 2022-08-19 IMAGING — MR MR ABDOMEN WO/W CM
20 series · 48 of 48 positions shown · IV contrast (10 GADAVIST)
Comparison: 04/11/2021

CLINICAL DATA: Metastatic colon cancer, assess treatment response

EXAM:
MRI ABDOMEN WITHOUT AND WITH CONTRAST
TECHNIQUE: Multiplanar multisequence MR imaging of the abdomen was performed
both before and after the administration of intravenous contrast.
CONTRAST:  10mL GADAVIST GADOBUTROL 1 MMOL/ML IV SOLN

[Series 3: T2 · coronal · 6.0mm · 1.56mm/px · 2 of 35 slices shown (1 of 2)]
[im 1/35]
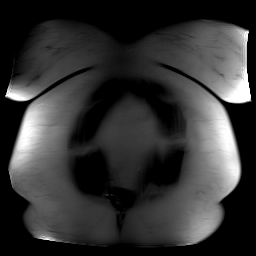
[im 35/35]
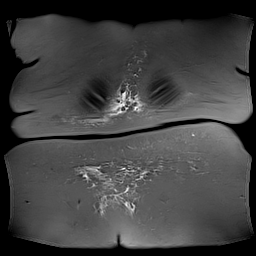

[Series 4: T2 fat-sat · axial · 6.0mm · 1.56mm/px · z∈[-110,+186]mm · 2 of 42 slices shown (1 of 3)]
[im 1/42]
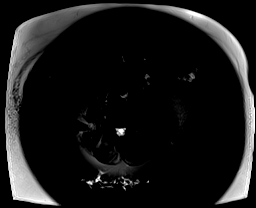
[im 42/42]
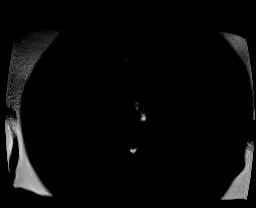

[Series 5: T2 fat-sat · 1 of 6 slices shown (2 of 3)]
[im 1/6]
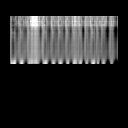

[Series 7: T1 · axial · 3.0mm · 1.25mm/px · z∈[-97,+164]mm · 3 of 88 slices shown (1 of 2)]
[im 1/88]
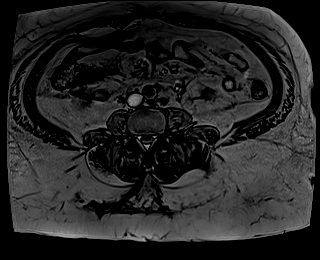
[im 44/88]
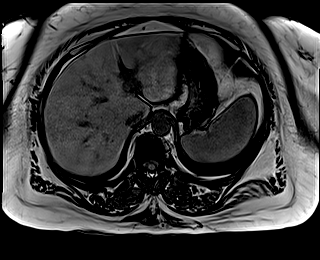
[im 88/88]
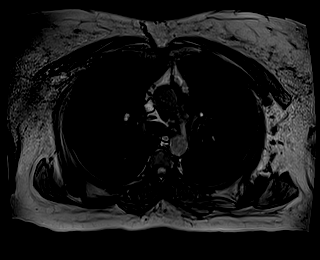

[Series 8: T1 · axial · 3.0mm · 1.25mm/px · z∈[-97,+164]mm · 3 of 88 slices shown (2 of 2)]
[im 1/88]
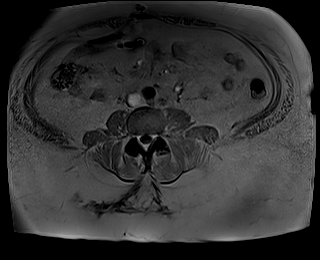
[im 44/88]
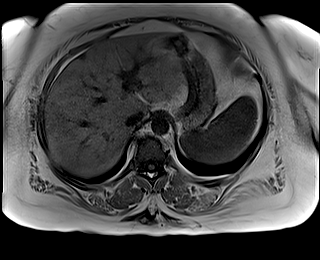
[im 88/88]
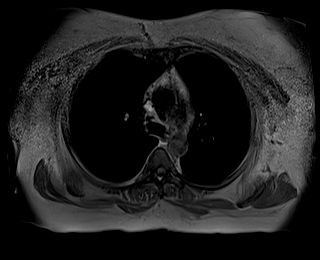

[Series 9: DWI · axial · 6.0mm · 1.49mm/px · z∈[-86,+166]mm · 2 of 72 slices shown (1 of 2)]
[im 1/72]
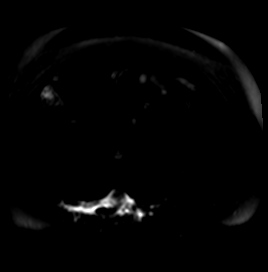
[im 72/72]
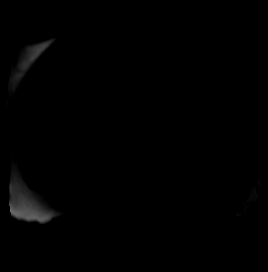

[Series 10: DWI · axial · 6.0mm · 1.49mm/px · 1 of 36 slices shown (2 of 2)]
[im 1/36]
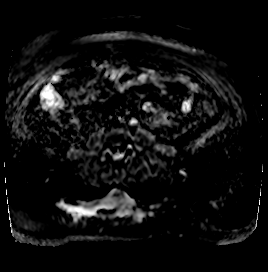

[Series 11: bSSFP · axial · 4.0mm · 0.84mm/px · z∈[-97,+159]mm · 2 of 65 slices shown]
[im 1/65]
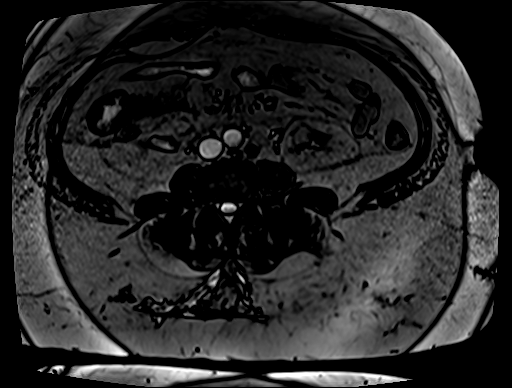
[im 65/65]
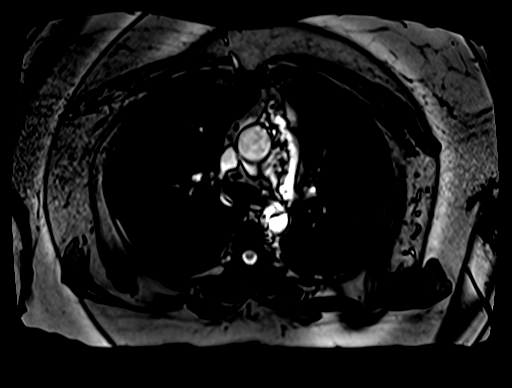

[Series 13: T2 fat-sat · axial · 6.0mm · 1.25mm/px · 1 of 36 slices shown (3 of 3)]
[im 1/36]
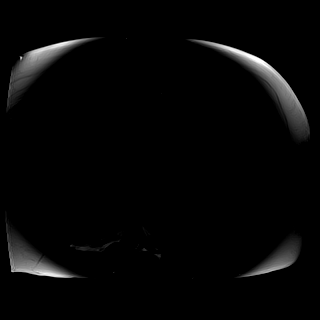

[Series 15: T1 dynamic · axial · 3.0mm · 1.25mm/px · z∈[-86,+175]mm · 3 of 88 slices shown (1 of 10)]
[im 1/88]
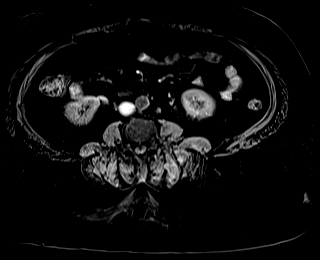
[im 44/88]
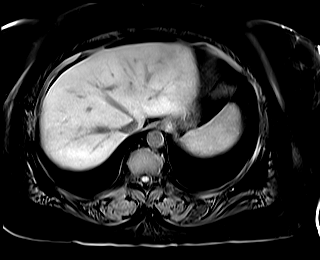
[im 88/88]
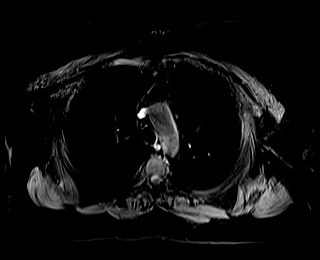

[Series 19: T1 dynamic · axial · 3.0mm · 1.25mm/px · z∈[-86,+175]mm · 3 of 88 slices shown (2 of 10)]
[im 1/88]
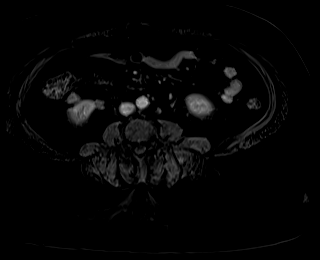
[im 44/88]
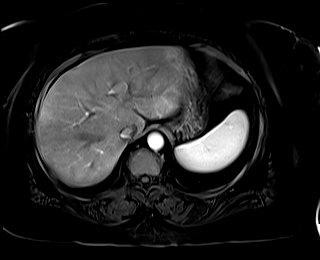
[im 88/88]
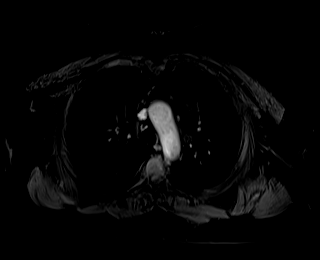

[Series 20: T1 dynamic · axial · 3.0mm · 1.25mm/px · z∈[-86,+175]mm · 3 of 88 slices shown (3 of 10)]
[im 1/88]
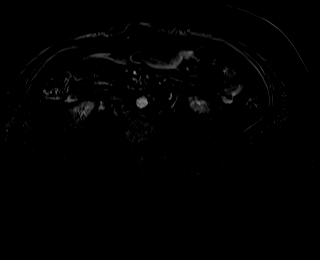
[im 44/88]
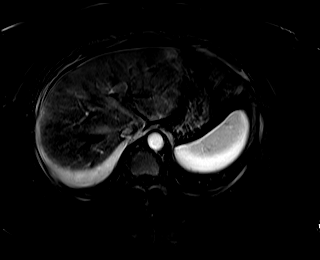
[im 88/88]
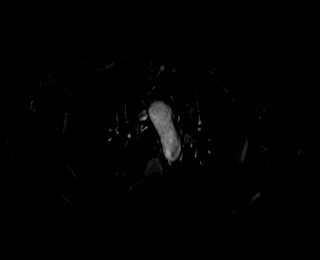

[Series 23: T1 dynamic · axial · 3.0mm · 1.25mm/px · z∈[-86,+175]mm · 3 of 88 slices shown (4 of 10)]
[im 1/88]
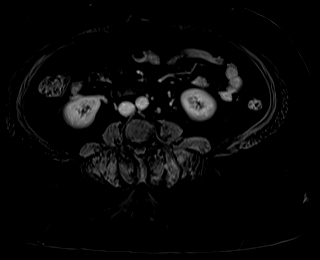
[im 44/88]
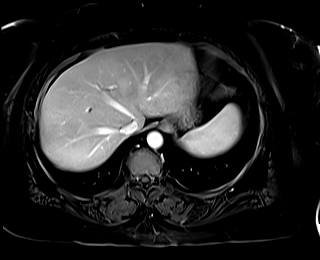
[im 88/88]
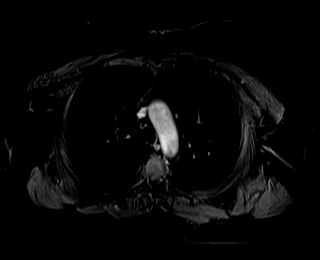

[Series 24: T1 dynamic · axial · 3.0mm · 1.25mm/px · z∈[-86,+175]mm · 3 of 88 slices shown (5 of 10)]
[im 1/88]
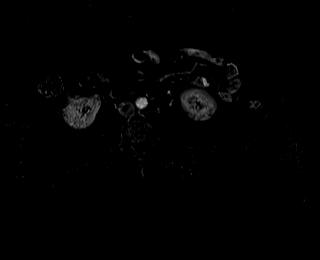
[im 44/88]
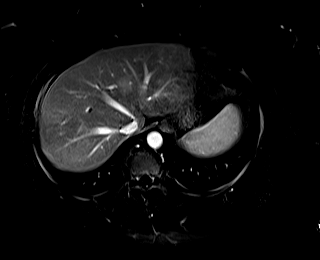
[im 88/88]
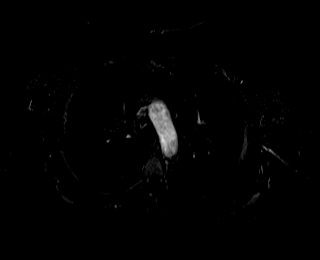

[Series 27: T1 dynamic · axial · 3.0mm · 1.25mm/px · z∈[-86,+175]mm · 3 of 88 slices shown (6 of 10)]
[im 1/88]
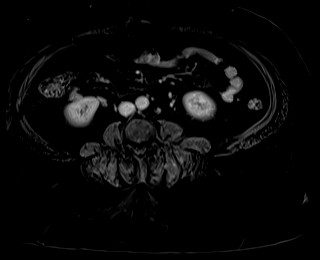
[im 44/88]
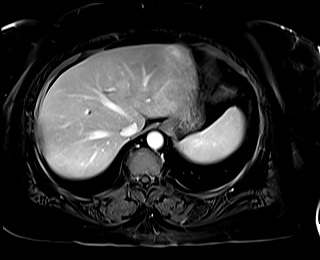
[im 88/88]
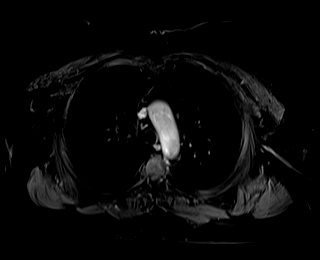

[Series 28: T1 dynamic · axial · 3.0mm · 1.25mm/px · z∈[-86,+175]mm · 3 of 88 slices shown (7 of 10)]
[im 1/88]
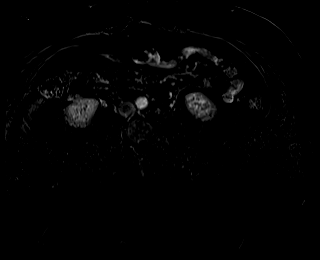
[im 44/88]
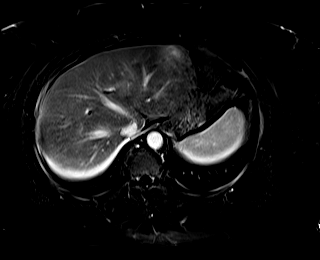
[im 88/88]
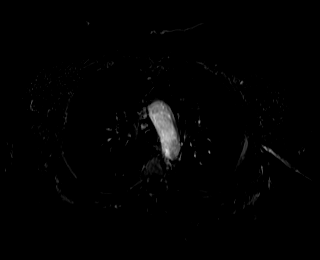

[Series 30: T1 dynamic · coronal · 3.0mm · 1.41mm/px · 3 of 88 slices shown (8 of 10)]
[im 1/88]
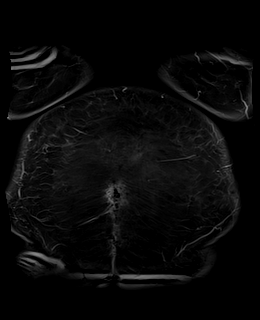
[im 44/88]
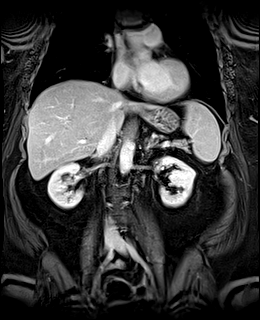
[im 88/88]
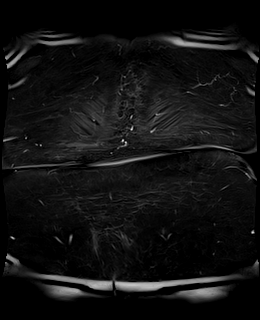

[Series 31: T2 · axial · 6.0mm · 1.56mm/px · 1 of 38 slices shown (2 of 2)]
[im 1/38]
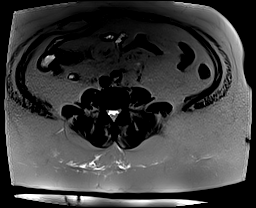

[Series 34: T1 dynamic · axial · 3.0mm · 1.25mm/px · z∈[-86,+175]mm · 3 of 88 slices shown (9 of 10)]
[im 1/88]
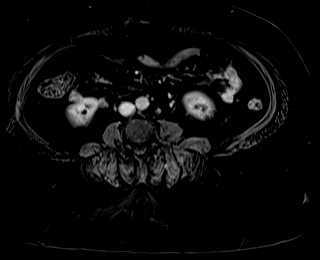
[im 44/88]
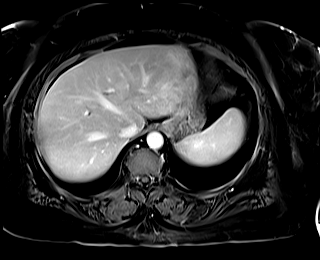
[im 88/88]
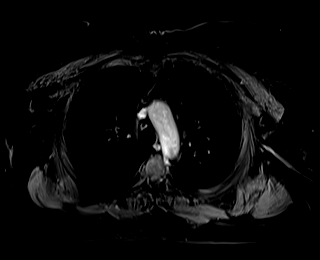

[Series 35: T1 dynamic · axial · 3.0mm · 1.25mm/px · z∈[-86,+175]mm · 3 of 88 slices shown (10 of 10)]
[im 1/88]
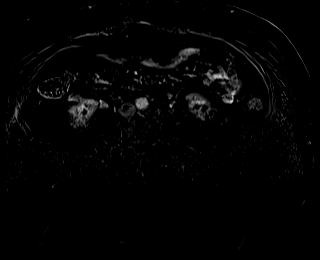
[im 44/88]
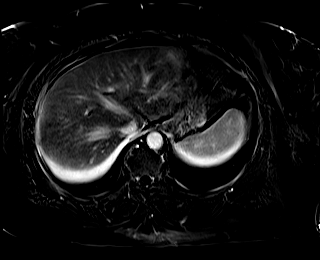
[im 88/88]
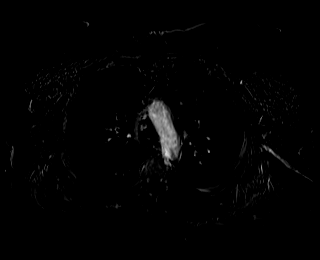

[48 of 48 positions shown; findings below may reference images not displayed]

FINDINGS: Lower chest: No acute findings.

Hepatobiliary: Unchanged appearance of small, hypoenhancing treated
liver metastases, for example a lesion of hepatic segment NECASOVA
measuring 0.8 x 0.4 cm (series 27, image 50) and of the caudate
measuring 0.6 x 0.5 cm (series 27, image 52). No new liver lesions.
Status post cholecystectomy. No biliary ductal dilatation.

Pancreas: No mass, inflammatory changes, or other parenchymal
abnormality identified.No pancreatic ductal dilatation.

Spleen:  Within normal limits in size and appearance.

Adrenals/Urinary Tract: Normal adrenal glands. Small, definitively
benign cyst of the posterior midportion of the right kidney (series
27, image 75). No renal masses or suspicious contrast enhancement
identified. No evidence of hydronephrosis.

Stomach/Bowel: Visualized portions within the abdomen are
unremarkable.

Vascular/Lymphatic: No pathologically enlarged lymph nodes
identified. No abdominal aortic aneurysm demonstrated.

Other:  None.

Musculoskeletal: No suspicious osseous lesions identified.
IMPRESSION: 1. Unchanged appearance of small, hypoenhancing treated liver
metastases. No new liver lesions.

2.  No other evidence metastatic disease in the abdomen.

## 2022-08-22 ENCOUNTER — Other Ambulatory Visit: Payer: Self-pay | Admitting: Interventional Radiology

## 2022-08-22 DIAGNOSIS — C189 Malignant neoplasm of colon, unspecified: Secondary | ICD-10-CM

## 2022-09-03 NOTE — Progress Notes (Signed)
Reason for follow up: Virtual telephone follow up, history of colon cancer metastatic to the liver, status post microwave ablation on 02/22/22 and Y90 radioembolization 08/15/22   Referring Physician(s): Dr. Arlan Organ  History of Present Illness: Lauren Mcpherson is a 57 y.o. 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 She is status post 13 cycles of FOLFOXIRI/Avastin (taken off avastin and oxaliplatin due to complications including PE) and switched to Xeloda. She was initially referred to our team September 2023 for evaluation and treatment options for multifocal liver masses. At the time of her first visit she had only two discrete small masses remaining in the caudate lobe and segment IV. These had been stable since at least March 2023 by MRI and prior to that they were diminishing in size serially since first seen June 2022.  She was deemed an appropriate candidate for targeted liver therapy and she underwent microwave ablation 02/22/22 of the right 2.1 cm subcapsular mass. Post-procedure surveillance MRIs at 2 and 5 months demonstrated an enlarging enhancement region around the inferior aspect of the ablation cavity with PET demonstrating hypermetabolism. This mass was then treated with targeted radioembolization 08/15/22.  She presents today via virtual telephone visit. She has done very well since the procedure and denies abdominal pain, nausea, vomiting, fevers or chills. Her last follow up with Dr. Myna Hidalgo was 07/27/22.  Past Medical History:  Diagnosis Date   Arthritis    Cancer [facility])    Goals of care, counseling/discussion 11/01/2020    Past Surgical History:  Procedure Laterality Date   BIOPSY  10/13/2020   Procedure: BIOPSY;  Surgeon: Bernette Redbird, MD;  Location: [facility];  Service: Endoscopy;;   CHOLECYSTECTOMY     COLONOSCOPY WITH PROPOFOL N/A 10/13/2020   Procedure: COLONOSCOPY WITH PROPOFOL;  Surgeon: Bernette Redbird,  MD;  Location: [facility];  Service: Endoscopy;  Laterality: N/A;   IR 3D INDEPENDENT WKST  08/01/2022   IR ANGIOGRAM SELECTIVE EACH ADDITIONAL VESSEL  08/01/2022   IR ANGIOGRAM SELECTIVE EACH ADDITIONAL VESSEL  08/01/2022   IR ANGIOGRAM SELECTIVE EACH ADDITIONAL VESSEL  08/01/2022   IR ANGIOGRAM SELECTIVE EACH ADDITIONAL VESSEL  08/15/2022   IR ANGIOGRAM SELECTIVE EACH ADDITIONAL VESSEL  08/15/2022   IR ANGIOGRAM VISCERAL SELECTIVE  08/01/2022   IR ANGIOGRAM VISCERAL SELECTIVE  08/15/2022   IR CV LINE INJECTION  11/24/2020   IR EMBO TUMOR ORGAN ISCHEMIA INFARCT INC GUIDE ROADMAPPING  08/15/2022   IR IMAGING GUIDED PORT INSERTION  11/08/2020   IR RADIOLOGIST EVAL & MGMT  01/11/2022   IR RADIOLOGIST EVAL & MGMT  04/26/2022   IR RADIOLOGIST EVAL & MGMT  07/03/2022   IR REMOVAL TUN ACCESS W/ PORT W/O FL MOD SED  12/01/2020   IR US GUIDE VASC ACCESS LEFT  08/01/2022   IR US GUIDE VASC ACCESS LEFT  08/15/2022   IR US GUIDE VASC ACCESS RIGHT  11/29/2021   LAPAROSCOPIC RIGHT COLECTOMY Right 10/15/2020   Procedure: LAPAROSCOPIC RIGHT COLECTOMY, LAPAROSCOPIC LIVER BIOPSY, LAPAROSCOPIC TAP BLOCK;  Surgeon: Gaynelle Adu, MD;  Location: [facility];  Service: General;  Laterality: Right;  90 MINUTES   POLYPECTOMY  10/13/2020   Procedure: POLYPECTOMY;  Surgeon: Bernette Redbird, MD;  Location: [facility];  Service: Endoscopy;;   RADIOLOGY WITH ANESTHESIA N/A 02/22/2022   Procedure: CT WITH ANESTHESIA MICROWAVE ABLATION;  Surgeon: Bennie Dallas, MD;  Location: [facility];  Service: Radiology;  Laterality: N/A;   TONSILLECTOMY  Allergies: Patient has no known allergies.  Medications: Prior to Admission medications   Medication Sig Start Date End Date Taking? Authorizing Provider  capecitabine (XELODA) 500 MG tablet Take 5 tablets (2,500 mg total) by mouth 2 (two) times daily with a meal. Take for 14 days, then hold for 7 days. Patient not taking: Reported on 03/08/2022 12/29/21   [name], MD  rivaroxaban (XARELTO) 10  MG TABS tablet Take 1 tablet (10 mg total) by mouth daily with supper. 01/25/22   [name], MD     No family history on file.  Social History   Socioeconomic History   Marital status: Married    Spouse name: Not on file   Number of children: Not on file   Years of education: Not on file   Highest education level: Not on file  Occupational History   Not on file  Tobacco Use   Smoking status: Former    Packs/day: 0.50    Years: 20.00    Additional pack years: 0.00    Total pack years: 10.00    Types: Cigarettes    Quit date: 2009    Years since quitting: 15.3   Smokeless tobacco: Never  Vaping Use   Vaping Use: Never used  Substance and Sexual Activity   Alcohol use: Yes    Comment: occ   Drug use: No   Sexual activity: Yes    Birth control/protection: None  Other Topics Concern   Not on file  Social History Narrative   Not on file   Social Determinants of Health   Financial Resource Strain: Low Risk  (11/30/2021)   Overall Financial Resource Strain (CARDIA)    Difficulty of Paying Living Expenses: Not very hard  Food Insecurity: No Food Insecurity (11/30/2021)   Hunger Vital Sign    Worried About Running Out of Food in the Last Year: Never true    Ran Out of Food in the Last Year: Never true  Transportation Needs: Unmet Transportation Needs (11/30/2021)   PRAPARE - Transportation    Lack of Transportation (Medical): Yes    Lack of Transportation (Non-Medical): Yes  Physical Activity: Insufficiently Active (11/30/2021)   Exercise Vital Sign    Days of Exercise per Week: 3 days    Minutes of Exercise per Session: 30 min  Stress: Not on file  Social Connections: Socially Integrated (11/30/2021)   Social Connection and Isolation Panel [NHANES]    Frequency of Communication with Friends and Family: More than three times a week    Frequency of Social Gatherings with Friends and Family: More than three times a week    Attends Religious Services: More than 4 times per  year    Active Member of [employer] or Organizations: Yes    Attends Engineer, structural: More than 4 times per year    Marital Status: Married    Vital Signs: No vitals signs or physical examination performed in lieu of virtual telephone clinic visit.   Imaging: IR EMBO TUMOR ORGAN ISCHEMIA INFARCT INC GUIDE ROADMAPPING  Result Date: 08/15/2022 INDICATION: 57 year old female with history of stage IV adenocarcinoma of the cecum metastatic to the liver status post microwave ablation on 02/22/2022 with imaging evidence of recurrent peripheral tumor enhancement. She presents today after technetium MAA mapping study on 08/01/2022 for planned segment 4B Y-90 radiation segmentectomy. EXAM: 1. Ultrasound-guided vascular access of the left radial artery. 2. Selective catheterization and angiography of the common, left hepatic, and segment 4B arteries. 3.  Transarterial radiation segmentectomy of segment 4B MEDICATIONS: Mefoxin 2 g, intravenous. The antibiotic was administered within 1 hour of the procedure Additional Medications: 20 mg IV Decadron, 40 mg IV Protonix, 4 mg IV Zofran Y-90 dose: 20.2 mCi ANESTHESIA/SEDATION: Moderate (conscious) sedation was employed during this procedure. A total of Versed 2 mg and Fentanyl 100 mcg was administered intravenously. Moderate Sedation Time: 57 minutes. The patient's level of consciousness and vital signs were monitored continuously by radiology nursing throughout the procedure under my direct supervision. CONTRAST:  25mL OMNIPAQUE IOHEXOL 300 MG/ML SOLN, 13mL OMNIPAQUE IOHEXOL 300 MG/ML SOLN, 12mL OMNIPAQUE IOHEXOL 300 MG/ML SOLN FLUOROSCOPY TIME:  Seven hundred twelve mGy COMPLICATIONS: None immediate. PROCEDURE: Informed consent was obtained from the patient following explanation of the procedure, risks, benefits and alternatives. The patient understands, agrees and consents for the procedure. All questions were addressed. A time out was performed prior to the  initiation of the procedure. Maximal barrier sterile technique utilized including caps, mask, sterile gowns, sterile gloves, large sterile drape, hand hygiene, and Betadine prep. The left wrist was prepped and draped in standard fashion. Pulse oximeter was attached to the left thumb. Tora Perches test was performed, grade A. The left radial artery measured 0.26 cm in diameter. Subdermal Local anesthesia was provided at the planned needle entry site with 1% lidocaine. A small skin nick was made. Under direct ultrasound visualization, the left radial artery was punctured with a 21 gauge micropuncture needle. A permanent image was captured and stored in the record. A microwire was placed and exchanged for a 4/5 French slender sheath. The sheath was flushed followed by installation of standard radial cocktail. An MG1 catheter was advanced into the celiac artery. Celiac angiography demonstrates conventional anatomy. A 2.4 French Progreat microcatheter and fathom 16 microwire were used to selectively catheterize the common hepatic artery. Selective angiography demonstrates the known mass in the left hepatic lobe, segment 4 B, which is the same mass visualized on a recent planning angiogram. These findings are consistent with a focus of metastatic colon cancer. There is no evidence of non-target extra-hepatic branches. Working with the authorized user Dr. Signa Kell, the previously prescribed Y90 dose was injected into the arterioles supplying the mass. Flow to the lesion was slowed but not occluded. The catheters were withdrawn and stowed by the radiation safety team. All wires and catheters were removed. Hemostasis was achieved using an Angioseal device. There were no immediate complications. Peripheral pulses were unchanged. The patient was transferred to the recovery area in stable condition. IMPRESSION: Technically successful segment 4B transarterial radiation segmentectomy. Marliss Coots, MD Vascular and Interventional  Radiology Specialists [facility] Radiology Electronically Signed   By: Marliss Coots M.D.   On: 08/15/2022 14:55   IR US Guide Vasc Access Left  Result Date: 08/15/2022 INDICATION: 57 year old female with history of stage IV adenocarcinoma of the cecum metastatic to the liver status post microwave ablation on 02/22/2022 with imaging evidence of recurrent peripheral tumor enhancement. She presents today after technetium MAA mapping study on 08/01/2022 for planned segment 4B Y-90 radiation segmentectomy. EXAM: 1. Ultrasound-guided vascular access of the left radial artery. 2. Selective catheterization and angiography of the common, left hepatic, and segment 4B arteries. 3. Transarterial radiation segmentectomy of segment 4B MEDICATIONS: Mefoxin 2 g, intravenous. The antibiotic was administered within 1 hour of the procedure Additional Medications: 20 mg IV Decadron, 40 mg IV Protonix, 4 mg IV Zofran Y-90 dose: 20.2 mCi ANESTHESIA/SEDATION: Moderate (conscious) sedation was employed during this procedure. A total of  Versed 2 mg and Fentanyl 100 mcg was administered intravenously. Moderate Sedation Time: 57 minutes. The patient's level of consciousness and vital signs were monitored continuously by radiology nursing throughout the procedure under my direct supervision. CONTRAST:  25mL OMNIPAQUE IOHEXOL 300 MG/ML SOLN, 13mL OMNIPAQUE IOHEXOL 300 MG/ML SOLN, 12mL OMNIPAQUE IOHEXOL 300 MG/ML SOLN FLUOROSCOPY TIME:  Seven hundred twelve mGy COMPLICATIONS: None immediate. PROCEDURE: Informed consent was obtained from the patient following explanation of the procedure, risks, benefits and alternatives. The patient understands, agrees and consents for the procedure. All questions were addressed. A time out was performed prior to the initiation of the procedure. Maximal barrier sterile technique utilized including caps, mask, sterile gowns, sterile gloves, large sterile drape, hand hygiene, and Betadine prep. The left wrist  was prepped and draped in standard fashion. Pulse oximeter was attached to the left thumb. Tora Perches test was performed, grade A. The left radial artery measured 0.26 cm in diameter. Subdermal Local anesthesia was provided at the planned needle entry site with 1% lidocaine. A small skin nick was made. Under direct ultrasound visualization, the left radial artery was punctured with a 21 gauge micropuncture needle. A permanent image was captured and stored in the record. A microwire was placed and exchanged for a 4/5 French slender sheath. The sheath was flushed followed by installation of standard radial cocktail. An MG1 catheter was advanced into the celiac artery. Celiac angiography demonstrates conventional anatomy. A 2.4 French Progreat microcatheter and fathom 16 microwire were used to selectively catheterize the common hepatic artery. Selective angiography demonstrates the known mass in the left hepatic lobe, segment 4 B, which is the same mass visualized on a recent planning angiogram. These findings are consistent with a focus of metastatic colon cancer. There is no evidence of non-target extra-hepatic branches. Working with the authorized user Dr. Signa Kell, the previously prescribed Y90 dose was injected into the arterioles supplying the mass. Flow to the lesion was slowed but not occluded. The catheters were withdrawn and stowed by the radiation safety team. All wires and catheters were removed. Hemostasis was achieved using an Angioseal device. There were no immediate complications. Peripheral pulses were unchanged. The patient was transferred to the recovery area in stable condition. IMPRESSION: Technically successful segment 4B transarterial radiation segmentectomy. Marliss Coots, MD Vascular and Interventional Radiology Specialists [facility] Radiology Electronically Signed   By: Marliss Coots M.D.   On: 08/15/2022 14:55   IR Angiogram Visceral Selective  Result Date: 08/15/2022 INDICATION:  57 year old female with history of stage IV adenocarcinoma of the cecum metastatic to the liver status post microwave ablation on 02/22/2022 with imaging evidence of recurrent peripheral tumor enhancement. She presents today after technetium MAA mapping study on 08/01/2022 for planned segment 4B Y-90 radiation segmentectomy. EXAM: 1. Ultrasound-guided vascular access of the left radial artery. 2. Selective catheterization and angiography of the common, left hepatic, and segment 4B arteries. 3. Transarterial radiation segmentectomy of segment 4B MEDICATIONS: Mefoxin 2 g, intravenous. The antibiotic was administered within 1 hour of the procedure Additional Medications: 20 mg IV Decadron, 40 mg IV Protonix, 4 mg IV Zofran Y-90 dose: 20.2 mCi ANESTHESIA/SEDATION: Moderate (conscious) sedation was employed during this procedure. A total of Versed 2 mg and Fentanyl 100 mcg was administered intravenously. Moderate Sedation Time: 57 minutes. The patient's level of consciousness and vital signs were monitored continuously by radiology nursing throughout the procedure under my direct supervision. CONTRAST:  25mL OMNIPAQUE IOHEXOL 300 MG/ML SOLN, 13mL OMNIPAQUE IOHEXOL 300 MG/ML SOLN, 12mL OMNIPAQUE IOHEXOL  300 MG/ML SOLN FLUOROSCOPY TIME:  Seven hundred twelve mGy COMPLICATIONS: None immediate. PROCEDURE: Informed consent was obtained from the patient following explanation of the procedure, risks, benefits and alternatives. The patient understands, agrees and consents for the procedure. All questions were addressed. A time out was performed prior to the initiation of the procedure. Maximal barrier sterile technique utilized including caps, mask, sterile gowns, sterile gloves, large sterile drape, hand hygiene, and Betadine prep. The left wrist was prepped and draped in standard fashion. Pulse oximeter was attached to the left thumb. Tora Perches test was performed, grade A. The left radial artery measured 0.26 cm in diameter.  Subdermal Local anesthesia was provided at the planned needle entry site with 1% lidocaine. A small skin nick was made. Under direct ultrasound visualization, the left radial artery was punctured with a 21 gauge micropuncture needle. A permanent image was captured and stored in the record. A microwire was placed and exchanged for a 4/5 French slender sheath. The sheath was flushed followed by installation of standard radial cocktail. An MG1 catheter was advanced into the celiac artery. Celiac angiography demonstrates conventional anatomy. A 2.4 French Progreat microcatheter and fathom 16 microwire were used to selectively catheterize the common hepatic artery. Selective angiography demonstrates the known mass in the left hepatic lobe, segment 4 B, which is the same mass visualized on a recent planning angiogram. These findings are consistent with a focus of metastatic colon cancer. There is no evidence of non-target extra-hepatic branches. Working with the authorized user Dr. Signa Kell, the previously prescribed Y90 dose was injected into the arterioles supplying the mass. Flow to the lesion was slowed but not occluded. The catheters were withdrawn and stowed by the radiation safety team. All wires and catheters were removed. Hemostasis was achieved using an Angioseal device. There were no immediate complications. Peripheral pulses were unchanged. The patient was transferred to the recovery area in stable condition. IMPRESSION: Technically successful segment 4B transarterial radiation segmentectomy. Marliss Coots, MD Vascular and Interventional Radiology Specialists [facility] Radiology Electronically Signed   By: Marliss Coots M.D.   On: 08/15/2022 14:55   IR Angiogram Selective Each Additional Vessel  Result Date: 08/15/2022 INDICATION: 57 year old female with history of stage IV adenocarcinoma of the cecum metastatic to the liver status post microwave ablation on 02/22/2022 with imaging evidence of  recurrent peripheral tumor enhancement. She presents today after technetium MAA mapping study on 08/01/2022 for planned segment 4B Y-90 radiation segmentectomy. EXAM: 1. Ultrasound-guided vascular access of the left radial artery. 2. Selective catheterization and angiography of the common, left hepatic, and segment 4B arteries. 3. Transarterial radiation segmentectomy of segment 4B MEDICATIONS: Mefoxin 2 g, intravenous. The antibiotic was administered within 1 hour of the procedure Additional Medications: 20 mg IV Decadron, 40 mg IV Protonix, 4 mg IV Zofran Y-90 dose: 20.2 mCi ANESTHESIA/SEDATION: Moderate (conscious) sedation was employed during this procedure. A total of Versed 2 mg and Fentanyl 100 mcg was administered intravenously. Moderate Sedation Time: 57 minutes. The patient's level of consciousness and vital signs were monitored continuously by radiology nursing throughout the procedure under my direct supervision. CONTRAST:  25mL OMNIPAQUE IOHEXOL 300 MG/ML SOLN, 13mL OMNIPAQUE IOHEXOL 300 MG/ML SOLN, 12mL OMNIPAQUE IOHEXOL 300 MG/ML SOLN FLUOROSCOPY TIME:  Seven hundred twelve mGy COMPLICATIONS: None immediate. PROCEDURE: Informed consent was obtained from the patient following explanation of the procedure, risks, benefits and alternatives. The patient understands, agrees and consents for the procedure. All questions were addressed. A time out was performed prior to the  initiation of the procedure. Maximal barrier sterile technique utilized including caps, mask, sterile gowns, sterile gloves, large sterile drape, hand hygiene, and Betadine prep. The left wrist was prepped and draped in standard fashion. Pulse oximeter was attached to the left thumb. Tora Perches test was performed, grade A. The left radial artery measured 0.26 cm in diameter. Subdermal Local anesthesia was provided at the planned needle entry site with 1% lidocaine. A small skin nick was made. Under direct ultrasound visualization, the left  radial artery was punctured with a 21 gauge micropuncture needle. A permanent image was captured and stored in the record. A microwire was placed and exchanged for a 4/5 French slender sheath. The sheath was flushed followed by installation of standard radial cocktail. An MG1 catheter was advanced into the celiac artery. Celiac angiography demonstrates conventional anatomy. A 2.4 French Progreat microcatheter and fathom 16 microwire were used to selectively catheterize the common hepatic artery. Selective angiography demonstrates the known mass in the left hepatic lobe, segment 4 B, which is the same mass visualized on a recent planning angiogram. These findings are consistent with a focus of metastatic colon cancer. There is no evidence of non-target extra-hepatic branches. Working with the authorized user Dr. Signa Kell, the previously prescribed Y90 dose was injected into the arterioles supplying the mass. Flow to the lesion was slowed but not occluded. The catheters were withdrawn and stowed by the radiation safety team. All wires and catheters were removed. Hemostasis was achieved using an Angioseal device. There were no immediate complications. Peripheral pulses were unchanged. The patient was transferred to the recovery area in stable condition. IMPRESSION: Technically successful segment 4B transarterial radiation segmentectomy. Marliss Coots, MD Vascular and Interventional Radiology Specialists [facility] Radiology Electronically Signed   By: Marliss Coots M.D.   On: 08/15/2022 14:55   IR Angiogram Selective Each Additional Vessel  Result Date: 08/15/2022 INDICATION: 57 year old female with history of stage IV adenocarcinoma of the cecum metastatic to the liver status post microwave ablation on 02/22/2022 with imaging evidence of recurrent peripheral tumor enhancement. She presents today after technetium MAA mapping study on 08/01/2022 for planned segment 4B Y-90 radiation segmentectomy. EXAM: 1.  Ultrasound-guided vascular access of the left radial artery. 2. Selective catheterization and angiography of the common, left hepatic, and segment 4B arteries. 3. Transarterial radiation segmentectomy of segment 4B MEDICATIONS: Mefoxin 2 g, intravenous. The antibiotic was administered within 1 hour of the procedure Additional Medications: 20 mg IV Decadron, 40 mg IV Protonix, 4 mg IV Zofran Y-90 dose: 20.2 mCi ANESTHESIA/SEDATION: Moderate (conscious) sedation was employed during this procedure. A total of Versed 2 mg and Fentanyl 100 mcg was administered intravenously. Moderate Sedation Time: 57 minutes. The patient's level of consciousness and vital signs were monitored continuously by radiology nursing throughout the procedure under my direct supervision. CONTRAST:  25mL OMNIPAQUE IOHEXOL 300 MG/ML SOLN, 13mL OMNIPAQUE IOHEXOL 300 MG/ML SOLN, 12mL OMNIPAQUE IOHEXOL 300 MG/ML SOLN FLUOROSCOPY TIME:  Seven hundred twelve mGy COMPLICATIONS: None immediate. PROCEDURE: Informed consent was obtained from the patient following explanation of the procedure, risks, benefits and alternatives. The patient understands, agrees and consents for the procedure. All questions were addressed. A time out was performed prior to the initiation of the procedure. Maximal barrier sterile technique utilized including caps, mask, sterile gowns, sterile gloves, large sterile drape, hand hygiene, and Betadine prep. The left wrist was prepped and draped in standard fashion. Pulse oximeter was attached to the left thumb. Tora Perches test was performed, grade A. The left radial  artery measured 0.26 cm in diameter. Subdermal Local anesthesia was provided at the planned needle entry site with 1% lidocaine. A small skin nick was made. Under direct ultrasound visualization, the left radial artery was punctured with a 21 gauge micropuncture needle. A permanent image was captured and stored in the record. A microwire was placed and exchanged for a 4/5  French slender sheath. The sheath was flushed followed by installation of standard radial cocktail. An MG1 catheter was advanced into the celiac artery. Celiac angiography demonstrates conventional anatomy. A 2.4 French Progreat microcatheter and fathom 16 microwire were used to selectively catheterize the common hepatic artery. Selective angiography demonstrates the known mass in the left hepatic lobe, segment 4 B, which is the same mass visualized on a recent planning angiogram. These findings are consistent with a focus of metastatic colon cancer. There is no evidence of non-target extra-hepatic branches. Working with the authorized user Dr. Signa [facility], the previously prescribed Y90 dose was injected into the arterioles supplying the mass. Flow to the lesion was slowed but not occluded. The catheters were withdrawn and stowed by the radiation safety team. All wires and catheters were removed. Hemostasis was achieved using an Angioseal device. There were no immediate complications. Peripheral pulses were unchanged. The patient was transferred to the recovery area in stable condition. IMPRESSION: Technically successful segment 4B transarterial radiation segmentectomy. Marliss Coots, MD [facility] Radiology Electronically Signed   By: Marliss Coots M.D.   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 Patient presents for radioembolization segmentectomy of lesion within segment 4b of the left hepatic lobe. EXAM: NUCLEAR MEDICINE SPECIAL MED RAD PHYSICS CONS; NUCLEAR MEDICINE RADIO PHARM THERAPY INTRA ARTERIAL; NUCLEAR MEDICINE TREATMENT PROCEDURE; NUCLEAR MEDICINE LIVER SCAN TECHNIQUE: In conjunction with the interventional radiologist a Y- Microsphere dose was calculated utilizing body surface area formulation. Calculated  dose equal 21.2 mCi. Pre therapy MAA liver SPECT scan and CTA were evaluated. Utilizing a microcatheter system, the hepatic artery was selected and Y-90 microspheres were delivered in fractionated aliquots. Radiopharmaceutical was delivered by the interventional radiologist under the supervision of the nuclear radiologist. The patient tolerated procedure well. No adverse effects were noted. Bremsstrahlung planar and SPECT imaging of the abdomen following intrahepatic arterial delivery of Y-90 microsphere was performed. RADIOPHARMACEUTICALS:  20.2 millicuries Y-90 microspheres COMPARISON:  08/01/2022 and MRI abdomen 06/24/2022. FINDINGS: Y - 90 microspheres therapy as above. First therapy to the left hepatic lobe (segment 4). Bremsstrahlung planar and SPECT imaging of the abdomen following intrahepatic arterial delivery of Y-77microsphere demonstrates radioactivity localized to the left hepatic lobe. No evidence of extrahepatic activity. IMPRESSION: Successful Y - 90 microsphere delivery for treatment of unresectable liver metastasis. First therapy to the medial segment left lobe (segment 4). Bremssstrahlung scan demonstrates activity localized to medial segment left hepatic lobe hepatic lobe with no extrahepatic activity identified. Electronically Signed   By: Signa [facility] M.D.   On: 08/15/2022 13:23    Labs:  CBC: Recent Labs    06/15/22 1231 07/27/22 0929 08/01/22 0757 08/15/22 0739  WBC 4.7 4.5 4.9 5.1  HGB 13.7 13.3 12.7 12.1  HCT 42.3 40.7 39.6 38.0  PLT 260 280 264 261    COAGS: Recent Labs    02/10/22 0948 08/01/22 0757 08/15/22 0739  INR 1.3* 1.3* 1.0    BMP: Recent Labs  06/15/22 1231 07/27/22 0929 08/01/22 0757 08/15/22 0739  NA 142 142 136 140  K 4.6 4.4 4.5 3.8  CL 106 104 104 106  CO2 28 29 22 27   GLUCOSE 116* 116* 98 108*  BUN 12 11 12 12   CALCIUM 9.5 9.1 8.5* 8.8*  CREATININE 0.84 0.79 0.84 0.80  GFRNONAA >60 >60 >60 >60    LIVER FUNCTION TESTS: Recent  Labs    06/15/22 1231 07/27/22 0929 08/01/22 0757 08/15/22 0739  BILITOT 0.4 0.5 1.0 0.7  AST 16 13* 24 17  ALT 10 8 17 11   ALKPHOS 74 69 62 62  PROT 7.2 7.1 7.0 6.9  ALBUMIN 4.1 3.9 3.6 3.5    TUMOR MARKERS: No results for input(s): "AFPTM", "CEA", "CA199", "CHROMGRNA" in the last 8760 hours.  Assessment and Plan:  57 year old female with a history of stage IV cecal adenocarcinoma with metastases to the liver. She is status post FOLFOXIRI, Avastin and  Xeloda (discontinued 02/2022). She had a 2.1 cm anterior segment IV subcapsular mass with mild enhancement on MRI, now status post microwave ablation on 02/22/22 and radioembolization 08/15/22.      Thank you for this interesting consult.  I greatly enjoyed meeting Lauren Mcpherson and look forward to participating in their care.  A copy of this report was sent to the requesting provider on this date.  Electronically Signed: Mickie Kay 09/03/2022, 12:12 PM   I spent a total of  25 Minutes in virtual telephone clinic consultation, greater than 50% of which was counseling/coordinating care for hepatic colon cancer metastases.

## 2022-09-05 ENCOUNTER — Ambulatory Visit
Admission: RE | Admit: 2022-09-05 | Discharge: 2022-09-05 | Disposition: A | Discharge status: Home / Self Care | Payer: PRIVATE HEALTH INSURANCE | Source: Ambulatory Visit | Attending: Interventional Radiology | Admitting: Interventional Radiology

## 2022-09-05 DIAGNOSIS — C189 Malignant neoplasm of colon, unspecified: Secondary | ICD-10-CM

## 2022-09-05 HISTORY — PX: IR RADIOLOGIST EVAL & MGMT: IMG5224

## 2022-09-07 ENCOUNTER — Inpatient Hospital Stay: Payer: PRIVATE HEALTH INSURANCE | Attending: Hematology & Oncology

## 2022-09-07 ENCOUNTER — Other Ambulatory Visit: Payer: Self-pay

## 2022-09-07 ENCOUNTER — Inpatient Hospital Stay (HOSPITAL_BASED_OUTPATIENT_CLINIC_OR_DEPARTMENT_OTHER): Payer: PRIVATE HEALTH INSURANCE | Admitting: Hematology & Oncology

## 2022-09-07 VITALS — BP 135/65 | HR 83 | Temp 97.9°F | Resp 18 | Ht 64.0 in | Wt 264.0 lb

## 2022-09-07 DIAGNOSIS — C180 Malignant neoplasm of cecum: Secondary | ICD-10-CM

## 2022-09-07 DIAGNOSIS — C787 Secondary malignant neoplasm of liver and intrahepatic bile duct: Secondary | ICD-10-CM | POA: Insufficient documentation

## 2022-09-07 DIAGNOSIS — Z86718 Personal history of other venous thrombosis and embolism: Secondary | ICD-10-CM | POA: Insufficient documentation

## 2022-09-07 DIAGNOSIS — Z86711 Personal history of pulmonary embolism: Secondary | ICD-10-CM | POA: Diagnosis not present

## 2022-09-07 LAB — CMP (CANCER CENTER ONLY)
ALT: 10 U/L (ref 0–44)
AST: 15 U/L (ref 15–41)
Albumin: 3.9 g/dL (ref 3.5–5.0)
Alkaline Phosphatase: 79 U/L (ref 38–126)
Anion gap: 9 (ref 5–15)
BUN: 12 mg/dL (ref 6–20)
CO2: 29 mmol/L (ref 22–32)
Calcium: 9.7 mg/dL (ref 8.9–10.3)
Chloride: 105 mmol/L (ref 98–111)
Creatinine: 0.78 mg/dL (ref 0.44–1.00)
GFR, Estimated: >60 mL/min (ref >60)
Glucose, Bld: 110 mg/dL — ABNORMAL HIGH (ref 70–99)
Potassium: 3.9 mmol/L (ref 3.5–5.1)
Sodium: 143 mmol/L (ref 135–145)
Total Bilirubin: 0.6 mg/dL (ref 0.3–1.2)
Total Protein: 6.9 g/dL (ref 6.5–8.1)

## 2022-09-07 LAB — CBC WITH DIFFERENTIAL (CANCER CENTER ONLY)
Abs Immature Granulocytes: 0.01 10*3/uL (ref 0.00–0.07)
Basophils Absolute: 0 10*3/uL (ref 0.0–0.1)
Basophils Relative: 1 %
Eosinophils Absolute: 0.4 10*3/uL (ref 0.0–0.5)
Eosinophils Relative: 9 %
HCT: 38.8 % (ref 36.0–46.0)
Hemoglobin: 12.4 g/dL (ref 12.0–15.0)
Immature Granulocytes: 0 %
Lymphocytes Relative: 28 %
Lymphs Abs: 1.3 10*3/uL (ref 0.7–4.0)
MCH: 28.9 pg (ref 26.0–34.0)
MCHC: 32 g/dL (ref 30.0–36.0)
MCV: 90.4 fL (ref 80.0–100.0)
Monocytes Absolute: 0.6 10*3/uL (ref 0.1–1.0)
Monocytes Relative: 12 %
Neutro Abs: 2.3 10*3/uL (ref 1.7–7.7)
Neutrophils Relative %: 50 %
Platelet Count: 293 10*3/uL (ref 150–400)
RBC: 4.29 MIL/uL (ref 3.87–5.11)
RDW: 15.2 % (ref 11.5–15.5)
WBC Count: 4.7 10*3/uL (ref 4.0–10.5)
nRBC: 0 % (ref 0.0–0.2)

## 2022-09-07 LAB — LACTATE DEHYDROGENASE: LDH: 145 U/L (ref 98–192)

## 2022-09-07 LAB — CEA (IN HOUSE-CHCC): CEA (CHCC-In House): 46.88 ng/mL — ABNORMAL HIGH (ref 0.00–5.00)

## 2022-09-07 NOTE — Progress Notes (Signed)
Hematology and Oncology Follow Up Visit  Lauren Mcpherson [id number] Mar 30, 1966 57 y.o. 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  She had no other intrahepatic therapy.  She had this in April.  She had a little bit of rough time for the first week.  She has some nausea.  She did not have much of an appetite.  She is feeling much better now.  As always, Dr. Elby Showers of Interventional Radiology does a great job with this procedure.  She will have a follow-up MRI in July.  She had a wonderful Mother's Day weekend.  Her son graduated from college.  He is now working for a Engineer, [employer] [place].  She has had no problems with cough.  There is no bleeding.  She is on Xarelto at a low dose.  She has had no problems with leg swelling.  There is no fever.  She has had no obvious change in bowel or bladder habits.  Overall, I would say that her performance status is probably ECOG 0.    Medications:  Allergies as of 09/07/2022   No Known Allergies      Medication List        Accurate as of Sep 07, 2022  8:14 AM. If you have any questions, ask your nurse or doctor.          capecitabine 500 MG tablet Commonly known as: XELODA Take 5 tablets (2,500 mg total) by mouth 2 (two) times daily with a meal. Take for 14 days, then hold for 7 days.   rivaroxaban 10 MG Tabs tablet Commonly known as: XARELTO Take 1 tablet (10 mg total) by mouth daily with supper.         Allergies: No Known Allergies  Past Medical History, Surgical history, Social history, and Family History were reviewed and updated.  Review of Systems: Review of Systems  Constitutional: Negative.   HENT: Negative.    Eyes: Negative.   Respiratory: Negative.    Cardiovascular: Negative.   Gastrointestinal: Negative.   Genitourinary: Negative.   Musculoskeletal: Negative.   Skin: Negative.   Neurological: Negative.   Endo/Heme/Allergies: Negative.   Psychiatric/Behavioral: Negative.       Physical Exam: Vital signs show a temperature of 97.9.  Pulse 83.  Blood pressure 116/79.  Weight is 264 pounds.  Wt Readings from Last 3 Encounters:  07/27/22 270 lb 1.3 oz (122.5 kg)  06/29/22 272 lb (123.4 kg)  06/15/22 274 lb (124.3 kg)    Physical Exam Vitals reviewed.  HENT:     Head: Normocephalic and atraumatic.  Eyes:     Pupils: Pupils are equal, round, and reactive to light.  Cardiovascular:     Rate and Rhythm: Normal rate and regular rhythm.     Heart sounds: Normal heart sounds.  Pulmonary:     Effort: Pulmonary effort is normal.     Breath sounds: Normal breath sounds.  Abdominal:  General: Bowel sounds are normal.     Palpations: Abdomen is soft.     Comments: Abdominal exam is obese but soft.  She has a laparotomy scar that is well-healed.  There is no fluid wave.  There is no palpable liver or spleen tip.  She has no abdominal mass.  Musculoskeletal:        General: No tenderness or deformity. Normal range of motion.     Cervical back: Normal range of motion.  Lymphadenopathy:     Cervical: No cervical adenopathy.  Skin:    General: Skin is warm and dry.     Findings: No erythema or rash.  Neurological:     Mental Status: She is alert and oriented to person, place, and time.  Psychiatric:        Behavior: Behavior normal.        Thought Content: Thought content normal.        Judgment: Judgment normal.      Lab Results  Component Value Date    WBC 4.7 09/07/2022   HGB 12.4 09/07/2022   HCT 38.8 09/07/2022   MCV 90.4 09/07/2022   PLT 293 09/07/2022   Lab Results  Component Value Date   FERRITIN 312 (H) 12/28/2021   IRON 57 12/28/2021   TIBC 230 (L) 12/28/2021   UIBC 173 12/28/2021   IRONPCTSAT 25 12/28/2021   Lab Results  Component Value Date   RETICCTPCT 1.5 12/14/2021   RBC 4.29 09/07/2022   No results found for: "KPAFRELGTCHN", "LAMBDASER", "KAPLAMBRATIO" No results found for: "IGGSERUM", "IGA", "IGMSERUM" No results found for: "TOTALPROTELP", "ALBUMINELP", "A1GS", "A2GS", "BETS", "BETA2SER", "GAMS", "MSPIKE", "SPEI"   Chemistry      Component Value Date/Time   NA 140 08/15/2022 0739   K 3.8 08/15/2022 0739   CL 106 08/15/2022 0739   CO2 27 08/15/2022 0739   BUN 12 08/15/2022 0739   CREATININE 0.80 08/15/2022 0739   CREATININE 0.79 07/27/2022 0929      Component Value Date/Time   CALCIUM 8.8 (L) 08/15/2022 0739   ALKPHOS 62 08/15/2022 0739   AST 17 08/15/2022 0739   AST 13 (L) 07/27/2022 0929   ALT 11 08/15/2022 0739   ALT 8 07/27/2022 0929   BILITOT 0.7 08/15/2022 0739   BILITOT 0.5 07/27/2022 0929       Impression and Plan: Lauren Mcpherson is a very pleasant 57 yo African American female with metastatic 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    We had her on systemic therapy for quite a while.  We then went ahead and did intrahepatic therapy on her.  This was initially done back in November 2023.  She then had a second treatment in April of this year.  I would like to hope that the second treatment has helped.  We will not know until she has a follow-up MRI in July.    I would like to have her come back to see [place] in about 6 weeks.  This would be a reasonable timeframe for follow-up.    Lauren Macho, MD 5/16/20248:14 AM

## 2022-10-19 ENCOUNTER — Inpatient Hospital Stay
Payer: No Typology Code available for payment source | Attending: Hematology & Oncology | Admitting: Hematology & Oncology

## 2022-10-19 ENCOUNTER — Inpatient Hospital Stay: Payer: No Typology Code available for payment source

## 2022-10-19 ENCOUNTER — Other Ambulatory Visit: Payer: Self-pay | Admitting: Oncology

## 2022-10-19 ENCOUNTER — Encounter: Payer: Self-pay | Admitting: Hematology & Oncology

## 2022-10-19 ENCOUNTER — Inpatient Hospital Stay: Payer: No Typology Code available for payment source | Admitting: Medical Oncology

## 2022-10-19 ENCOUNTER — Encounter: Payer: Self-pay | Admitting: *Deleted

## 2022-10-19 VITALS — BP 125/73 | HR 76 | Temp 98.5°F | Resp 20 | Ht 64.0 in | Wt 264.4 lb

## 2022-10-19 DIAGNOSIS — C787 Secondary malignant neoplasm of liver and intrahepatic bile duct: Secondary | ICD-10-CM | POA: Insufficient documentation

## 2022-10-19 DIAGNOSIS — C180 Malignant neoplasm of cecum: Secondary | ICD-10-CM | POA: Insufficient documentation

## 2022-10-19 DIAGNOSIS — Z86711 Personal history of pulmonary embolism: Secondary | ICD-10-CM | POA: Insufficient documentation

## 2022-10-19 DIAGNOSIS — Z7901 Long term (current) use of anticoagulants: Secondary | ICD-10-CM | POA: Insufficient documentation

## 2022-10-19 DIAGNOSIS — I2690 Septic pulmonary embolism without acute cor pulmonale: Secondary | ICD-10-CM

## 2022-10-19 LAB — CMP (CANCER CENTER ONLY)
ALT: 12 U/L (ref 0–44)
AST: 20 U/L (ref 15–41)
Albumin: 3.9 g/dL (ref 3.5–5.0)
Alkaline Phosphatase: 78 U/L (ref 38–126)
Anion gap: 8 (ref 5–15)
BUN: 11 mg/dL (ref 6–20)
CO2: 28 mmol/L (ref 22–32)
Calcium: 9.5 mg/dL (ref 8.9–10.3)
Chloride: 107 mmol/L (ref 98–111)
Creatinine: 0.84 mg/dL (ref 0.44–1.00)
GFR, Estimated: >60 mL/min (ref >60)
Glucose, Bld: 100 mg/dL — ABNORMAL HIGH (ref 70–99)
Potassium: 4 mmol/L (ref 3.5–5.1)
Sodium: 143 mmol/L (ref 135–145)
Total Bilirubin: 0.5 mg/dL (ref 0.3–1.2)
Total Protein: 6.6 g/dL (ref 6.5–8.1)

## 2022-10-19 LAB — CBC WITH DIFFERENTIAL (CANCER CENTER ONLY)
Abs Immature Granulocytes: 0.03 10*3/uL (ref 0.00–0.07)
Basophils Absolute: 0 10*3/uL (ref 0.0–0.1)
Basophils Relative: 1 %
Eosinophils Absolute: 0.3 10*3/uL (ref 0.0–0.5)
Eosinophils Relative: 7 %
HCT: 38.8 % (ref 36.0–46.0)
Hemoglobin: 12.5 g/dL (ref 12.0–15.0)
Immature Granulocytes: 1 %
Lymphocytes Relative: 33 %
Lymphs Abs: 1.6 10*3/uL (ref 0.7–4.0)
MCH: 29.1 pg (ref 26.0–34.0)
MCHC: 32.2 g/dL (ref 30.0–36.0)
MCV: 90.2 fL (ref 80.0–100.0)
Monocytes Absolute: 0.5 10*3/uL (ref 0.1–1.0)
Monocytes Relative: 9 %
Neutro Abs: 2.3 10*3/uL (ref 1.7–7.7)
Neutrophils Relative %: 49 %
Platelet Count: 257 10*3/uL (ref 150–400)
RBC: 4.3 MIL/uL (ref 3.87–5.11)
RDW: 15.9 % — ABNORMAL HIGH (ref 11.5–15.5)
WBC Count: 4.8 10*3/uL (ref 4.0–10.5)
nRBC: 0 % (ref 0.0–0.2)

## 2022-10-19 LAB — CEA (IN HOUSE-CHCC): CEA (CHCC-In House): 87.65 ng/mL — ABNORMAL HIGH (ref 0.00–5.00)

## 2022-10-19 MED ORDER — RIVAROXABAN 10 MG PO TABS
10.0000 mg | ORAL_TABLET | Freq: Every day | ORAL | 5 refills | Status: DC
Start: 2022-10-19 — End: 2023-07-05

## 2022-10-19 NOTE — Progress Notes (Signed)
Hematology and Oncology Follow Up Visit  Lauren Mcpherson [id number] [birth year] 57 y.o. 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  She looks good.  She feels good.  She really has had no complaints.  More last saw her back in May, CEA was up to 47.  This, unfortunately, continues to go up.  I know that she had the ablation back in April.  I think we will get had to get her set up with a MRI of the liver and then do a PET scan on her in a couple weeks.  I had to say but she certainly could have another recurrence.  If there is recurrence, we have to see if this is localized to the liver or if this is secondary to extrahepatic disease.  She has had no cough or shortness of breath.  There is been no change in bowel or bladder habits.  She has had no nausea or vomiting.  She has had no bleeding or bruising.  There is been no leg swelling.  Overall, I would have said that her performance status probably ECOG 0.    Medications:  Allergies as of 10/19/2022   No Known Allergies      Medication List        Accurate as of October 19, 2022  8:04 AM. If you have any questions, ask your nurse or doctor.          rivaroxaban 10 MG Tabs tablet Commonly known as: XARELTO Take 1 tablet (10 mg total) by mouth daily with supper.        Allergies: No Known Allergies  Past Medical History, Surgical history, Social history, and Family History were reviewed and  updated.  Review of Systems: Review of Systems  Constitutional: Negative.   HENT: Negative.    Eyes: Negative.   Respiratory: Negative.    Cardiovascular: Negative.   Gastrointestinal: Negative.   Genitourinary: Negative.   Musculoskeletal: Negative.   Skin: Negative.   Neurological: Negative.   Endo/Heme/Allergies: Negative.   Psychiatric/Behavioral: Negative.       Physical Exam: Vital signs show a temperature of 98.5.  Pulse 76.  Blood pressure 125/73.  Weight is 264 pounds.    Wt Readings from Last 3 Encounters:  10/19/22 264 lb 6.4 oz (119.9 kg)  09/07/22 264 lb (119.7 kg)  07/27/22 270 lb 1.3 oz (122.5 kg)    Physical Exam Vitals reviewed.  HENT:     Head: Normocephalic and atraumatic.  Eyes:     Pupils: Pupils are equal, round, and reactive to light.  Cardiovascular:     Rate and Rhythm: Normal rate and regular rhythm.     Heart sounds: Normal heart sounds.  Pulmonary:     Effort: Pulmonary effort is normal.     Breath sounds: Normal breath sounds.  Abdominal:     General: Bowel sounds are normal.     Palpations: Abdomen is soft.  Comments: Abdominal exam is obese but soft.  She has a laparotomy scar that is well-healed.  There is no fluid wave.  There is no palpable liver or spleen tip.  She has no abdominal mass.  Musculoskeletal:        General: No tenderness or deformity. Normal range of motion.     Cervical back: Normal range of motion.  Lymphadenopathy:     Cervical: No cervical adenopathy.  Skin:    General: Skin is warm and dry.     Findings: No erythema or rash.  Neurological:     Mental Status: She is alert and oriented to person, place, and time.  Psychiatric:        Behavior: Behavior normal.        Thought Content: Thought content normal.        Judgment: Judgment normal.     Lab Results  Component Value Date   WBC 4.8 10/19/2022   HGB 12.5 10/19/2022   HCT 38.8 10/19/2022   MCV 90.2 10/19/2022   PLT 257 10/19/2022   Lab  Results  Component Value Date   FERRITIN 312 (H) 12/28/2021   IRON 57 12/28/2021   TIBC 230 (L) 12/28/2021   UIBC 173 12/28/2021   IRONPCTSAT 25 12/28/2021   Lab Results  Component Value Date   RETICCTPCT 1.5 12/14/2021   RBC 4.30 10/19/2022   No results found for: "KPAFRELGTCHN", "LAMBDASER", "KAPLAMBRATIO" No results found for: "IGGSERUM", "IGA", "IGMSERUM" No results found for: "TOTALPROTELP", "ALBUMINELP", "A1GS", "A2GS", "BETS", "BETA2SER", "GAMS", "MSPIKE", "SPEI"   Chemistry      Component Value Date/Time   NA 143 09/07/2022 0757   K 3.9 09/07/2022 0757   CL 105 09/07/2022 0757   CO2 29 09/07/2022 0757   BUN 12 09/07/2022 0757   CREATININE 0.78 09/07/2022 0757      Component Value Date/Time   CALCIUM 9.7 09/07/2022 0757   ALKPHOS 79 09/07/2022 0757   AST 15 09/07/2022 0757   ALT 10 09/07/2022 0757   BILITOT 0.6 09/07/2022 0757       Impression and Plan: Lauren Mcpherson is a very pleasant 57 yo African American female with metastatic 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    We had her on systemic therapy for quite a while.  We then went ahead and did intrahepatic therapy on her.  This was initially done back in November 2023.  She then had a second treatment in April of this year.  I hate the fact that she has a rising CEA.  Again, this is usually a good indicator that her cancer is progressing again.  We will have to set up with an MRI of the liver early in July.  I would also like to get a PET scan done to see if she has any systemic disease.  If we do find that she has recurrence, we may have to consider a biopsy so that we get up-to-date molecular analysis.  I know that she is trying her best.  She looks great.  I know she will have a wonderful weekend.  I know that she will have a busy July 4 holiday.  We will plan to get her back after she has a MRI and PET scan.    [name], MD 6/27/20248:04 AM

## 2022-11-09 ENCOUNTER — Other Ambulatory Visit: Payer: Self-pay

## 2022-11-09 ENCOUNTER — Telehealth: Payer: Self-pay

## 2022-11-09 DIAGNOSIS — C180 Malignant neoplasm of cecum: Secondary | ICD-10-CM

## 2022-11-09 NOTE — Progress Notes (Signed)
Patient called her insurance company and confirmed [facility] is in [facility] for MRI, PET approved through [employer]. [place] PA obtained per [employer]. WIll try and schedule both MRI and PET at [facility] with PA on file and get MRI at GI if unable to do at [facility]. Pt aware. Orders faxed. [facility] imaging aware of MRI orders as well.   Pt informed.

## 2022-11-09 NOTE — Telephone Encounter (Signed)
Patient called stating she is scheduled for MRI and PET tomorrow 7/19 but was just called and informed she had to pay 11k upfront as she was out of network and cannot do that. Called pre service center and they reviewed it and since she is out of network she would have to pay the amount in full. Called patient back and informed her we would cancel those appt and advised her to call her insurance and see what imaging centers were in network, she will call us back and inform us so we can reorder scans. MD aware.

## 2022-11-10 ENCOUNTER — Ambulatory Visit (HOSPITAL_COMMUNITY): Payer: PRIVATE HEALTH INSURANCE

## 2022-11-10 ENCOUNTER — Encounter (HOSPITAL_COMMUNITY): Payer: Self-pay

## 2022-11-10 ENCOUNTER — Encounter (HOSPITAL_COMMUNITY): Payer: PRIVATE HEALTH INSURANCE

## 2022-11-15 ENCOUNTER — Inpatient Hospital Stay: Payer: No Typology Code available for payment source | Attending: Hematology & Oncology

## 2022-11-15 ENCOUNTER — Inpatient Hospital Stay (HOSPITAL_BASED_OUTPATIENT_CLINIC_OR_DEPARTMENT_OTHER): Payer: No Typology Code available for payment source | Admitting: Hematology & Oncology

## 2022-11-15 ENCOUNTER — Encounter: Payer: Self-pay | Admitting: Hematology & Oncology

## 2022-11-15 VITALS — BP 123/65 | HR 72 | Temp 98.9°F | Resp 20 | Ht 64.0 in | Wt 264.0 lb

## 2022-11-15 DIAGNOSIS — C787 Secondary malignant neoplasm of liver and intrahepatic bile duct: Secondary | ICD-10-CM | POA: Insufficient documentation

## 2022-11-15 DIAGNOSIS — C180 Malignant neoplasm of cecum: Secondary | ICD-10-CM | POA: Diagnosis present

## 2022-11-15 LAB — CMP (CANCER CENTER ONLY)
ALT: 21 U/L (ref 0–44)
AST: 27 U/L (ref 15–41)
Albumin: 4.1 g/dL (ref 3.5–5.0)
Alkaline Phosphatase: 106 U/L (ref 38–126)
Anion gap: 9 (ref 5–15)
BUN: 11 mg/dL (ref 6–20)
CO2: 29 mmol/L (ref 22–32)
Calcium: 10 mg/dL (ref 8.9–10.3)
Chloride: 104 mmol/L (ref 98–111)
Creatinine: 0.8 mg/dL (ref 0.44–1.00)
GFR, Estimated: >60 mL/min (ref >60)
Glucose, Bld: 117 mg/dL — ABNORMAL HIGH (ref 70–99)
Potassium: 4.2 mmol/L (ref 3.5–5.1)
Sodium: 142 mmol/L (ref 135–145)
Total Bilirubin: 0.4 mg/dL (ref 0.3–1.2)
Total Protein: 7.2 g/dL (ref 6.5–8.1)

## 2022-11-15 LAB — CBC WITH DIFFERENTIAL (CANCER CENTER ONLY)
Abs Immature Granulocytes: 0.01 10*3/uL (ref 0.00–0.07)
Basophils Absolute: 0.1 10*3/uL (ref 0.0–0.1)
Basophils Relative: 1 %
Eosinophils Absolute: 0.4 10*3/uL (ref 0.0–0.5)
Eosinophils Relative: 8 %
HCT: 41.3 % (ref 36.0–46.0)
Hemoglobin: 13.1 g/dL (ref 12.0–15.0)
Immature Granulocytes: 0 %
Lymphocytes Relative: 34 %
Lymphs Abs: 1.8 10*3/uL (ref 0.7–4.0)
MCH: 28.9 pg (ref 26.0–34.0)
MCHC: 31.7 g/dL (ref 30.0–36.0)
MCV: 91.2 fL (ref 80.0–100.0)
Monocytes Absolute: 0.5 10*3/uL (ref 0.1–1.0)
Monocytes Relative: 10 %
Neutro Abs: 2.5 10*3/uL (ref 1.7–7.7)
Neutrophils Relative %: 47 %
Platelet Count: 307 10*3/uL (ref 150–400)
RBC: 4.53 MIL/uL (ref 3.87–5.11)
RDW: 15.3 % (ref 11.5–15.5)
WBC Count: 5.2 10*3/uL (ref 4.0–10.5)
nRBC: 0 % (ref 0.0–0.2)

## 2022-11-15 LAB — LACTATE DEHYDROGENASE: LDH: 175 U/L (ref 98–192)

## 2022-11-15 LAB — CEA (IN HOUSE-CHCC): CEA (CHCC-In House): 113.12 ng/mL — ABNORMAL HIGH (ref 0.00–5.00)

## 2022-11-15 NOTE — Progress Notes (Signed)
Hematology and Oncology Follow Up Visit  Lauren Mcpherson [id number] [birth year] 57 y.o. 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 is here today for follow-up.  Unfortunately, I have a feeling that her cancer is progressing.  [name] last saw her, the CEA was up to 88.  We are trying to get MRI and PET scans on her.  This is very difficult because of insurance.  We are finally being able to get these next week.  Ultimately, I really think we are going to have to do a biopsy.  She has not had a biopsy for a couple years.  We really need to get some molecular studies to see if anything has changed.  She feels well.  She has had no abdominal pain.  She has had no nausea or vomiting.  She has had no change in bowel or bladder habits.  There has been no cough.  She has had no fever.  There has been no rashes.  She has had no bleeding.  Overall, I would say that her performance status is probably ECOG 1.   Medications:  Allergies as of 11/15/2022   No Known Allergies      Medication List        Accurate as of November 15, 2022  1:13 PM. If you have any questions, ask your nurse or doctor.          OVER THE COUNTER MEDICATION daily. Nervive - one capsule daily.   rivaroxaban 10 MG Tabs tablet Commonly known as: XARELTO Take 1 tablet (10 mg total) by mouth daily with supper.        Allergies: No Known Allergies  Past Medical History, Surgical history, Social history, and Family History were  reviewed and updated.  Review of Systems: Review of Systems  Constitutional: Negative.   HENT: Negative.    Eyes: Negative.   Respiratory: Negative.    Cardiovascular: Negative.   Gastrointestinal: Negative.   Genitourinary: Negative.   Musculoskeletal: Negative.   Skin: Negative.   Neurological: Negative.   Endo/Heme/Allergies: Negative.   Psychiatric/Behavioral: Negative.       Physical Exam: Vital signs show a temperature of 98.5.  Pulse 76.  Blood pressure 125/73.  Weight is 264 pounds.    Wt Readings from Last 3 Encounters:  11/15/22 264 lb (119.7 kg)  10/19/22 264 lb 6.4 oz (119.9 kg)  09/07/22 264 lb (119.7 kg)    Physical Exam Vitals reviewed.  HENT:     Head: Normocephalic and atraumatic.  Eyes:     Pupils: Pupils are equal, round, and reactive to light.  Cardiovascular:     Rate and Rhythm: Normal rate and regular rhythm.     Heart sounds: Normal heart sounds.  Pulmonary:     Effort: Pulmonary effort is normal.     Breath sounds: Normal breath sounds.  Abdominal:     General: Bowel sounds are normal.     Palpations: Abdomen is soft.  Comments: Abdominal exam is obese but soft.  She has a laparotomy scar that is well-healed.  There is no fluid wave.  There is no palpable liver or spleen tip.  She has no abdominal mass.  Musculoskeletal:        General: No tenderness or deformity. Normal range of motion.     Cervical back: Normal range of motion.  Lymphadenopathy:     Cervical: No cervical adenopathy.  Skin:    General: Skin is warm and dry.     Findings: No erythema or rash.  Neurological:     Mental Status: She is alert and oriented to person, place, and time.  Psychiatric:        Behavior: Behavior normal.        Thought Content: Thought content normal.        Judgment: Judgment normal.      Lab Results  Component Value Date   WBC 5.2 11/15/2022   HGB 13.1 11/15/2022   HCT 41.3 11/15/2022   MCV 91.2 11/15/2022   PLT 307 11/15/2022    Lab Results  Component Value Date   FERRITIN 312 (H) 12/28/2021   IRON 57 12/28/2021   TIBC 230 (L) 12/28/2021   UIBC 173 12/28/2021   IRONPCTSAT 25 12/28/2021   Lab Results  Component Value Date   RETICCTPCT 1.5 12/14/2021   RBC 4.53 11/15/2022   No results found for: "KPAFRELGTCHN", "LAMBDASER", "KAPLAMBRATIO" No results found for: "IGGSERUM", "IGA", "IGMSERUM" No results found for: "TOTALPROTELP", "ALBUMINELP", "A1GS", "A2GS", "BETS", "BETA2SER", "GAMS", "MSPIKE", "SPEI"   Chemistry      Component Value Date/Time   NA 142 11/15/2022 1229   K 4.2 11/15/2022 1229   CL 104 11/15/2022 1229   CO2 29 11/15/2022 1229   BUN 11 11/15/2022 1229   CREATININE 0.80 11/15/2022 1229      Component Value Date/Time   CALCIUM 10.0 11/15/2022 1229   ALKPHOS 106 11/15/2022 1229   AST 27 11/15/2022 1229   ALT 21 11/15/2022 1229   BILITOT 0.4 11/15/2022 1229       Impression and Plan: [name] is a very pleasant 57 yo African American female with metastatic 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    We had her on systemic therapy for quite a while.  We then went ahead and did intrahepatic therapy on her.  This was initially done back in November 2023.  She then had a second treatment in April of this year.  I am sure that her CEA continues to rise.  The scans will be very helpful.  Again, I have to believe that we are going to need a biopsy.  I probably would consider treating her with Lonsurf/Avastin.  I realize that she has had thromboembolic disease in the past.  However, we have her on anticoagulation.  She is in great shape.  As such, we can certainly still be aggressive with her.  We will have to see what the MRI/PET scan shows.  We will then see about set her up with a biopsy.  I probably will plan to get her back to see [place] in about 3 weeks or so.   [name], MD 7/24/20241:13 PM

## 2022-11-21 ENCOUNTER — Other Ambulatory Visit: Payer: Self-pay | Admitting: Interventional Radiology

## 2022-11-21 DIAGNOSIS — C787 Secondary malignant neoplasm of liver and intrahepatic bile duct: Secondary | ICD-10-CM

## 2022-11-21 DIAGNOSIS — C180 Malignant neoplasm of cecum: Secondary | ICD-10-CM

## 2022-11-30 ENCOUNTER — Telehealth: Payer: Self-pay | Admitting: *Deleted

## 2022-11-30 NOTE — Telephone Encounter (Signed)
Received call from [facility] stating that patient has a consult next Tuesday, August 13th with Dr. Flonnie Hailstone.

## 2022-12-04 ENCOUNTER — Ambulatory Visit
Admission: RE | Admit: 2022-12-04 | Discharge: 2022-12-04 | Disposition: A | Discharge status: Home / Self Care | Payer: PRIVATE HEALTH INSURANCE | Source: Ambulatory Visit | Attending: Interventional Radiology | Admitting: Interventional Radiology

## 2022-12-04 DIAGNOSIS — C787 Secondary malignant neoplasm of liver and intrahepatic bile duct: Secondary | ICD-10-CM

## 2022-12-04 DIAGNOSIS — C180 Malignant neoplasm of cecum: Secondary | ICD-10-CM

## 2022-12-04 HISTORY — PX: IR RADIOLOGIST EVAL & MGMT: IMG5224

## 2022-12-04 NOTE — Progress Notes (Signed)
Reason for follow up: Virtual telephone follow up, history of colon cancer metastatic to the liver, status post microwave ablation on 02/22/22 and Y90 radioembolization 08/15/22    Referring Physician(s): Dr. Arlan Organ   History of Present Illness: Lauren Mcpherson is a 57 y.o. 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 She is status post 13 cycles of FOLFOXIRI/Avastin (taken off avastin and oxaliplatin due to complications including PE) and switched to Xeloda. She was initially referred to our team September 2023 for evaluation and treatment options for multifocal liver masses. At the time of her first visit she had only two discrete small masses remaining in the caudate lobe and segment IV. These had been stable since at least March 2023 by MRI and prior to that they were diminishing in size serially since first seen June 2022.  She was deemed an appropriate candidate for targeted liver therapy and she underwent microwave ablation 02/22/22 of the right 2.1 cm subcapsular mass. Post-procedure surveillance MRIs at 2 and 5 months demonstrated an enlarging enhancement region around the inferior aspect of the ablation cavity with PET demonstrating hypermetabolism. This mass was then treated with radiation segmentectomy 08/15/22.   She continues to state she feels well and remains asymptomatic.  She last saw Dr. Myna Hidalgo on 7/24, with follow up later this week on 8/15.  No fevers, chills, jaundice, change in bowel habits/character. She had an MRI abdomen and PET done on 11/20/22.   Past Medical History:  Diagnosis Date   Arthritis    Cancer [facility])    Goals of care, counseling/discussion 11/01/2020    Past Surgical History:  Procedure Laterality Date   BIOPSY  10/13/2020   Procedure: BIOPSY;  Surgeon: Bernette Redbird, MD;  Location: [facility];  Service: Endoscopy;;   CHOLECYSTECTOMY     COLONOSCOPY WITH PROPOFOL N/A 10/13/2020   Procedure:  COLONOSCOPY WITH PROPOFOL;  Surgeon: Bernette Redbird, MD;  Location: [facility];  Service: Endoscopy;  Laterality: N/A;   IR 3D INDEPENDENT WKST  08/01/2022   IR ANGIOGRAM SELECTIVE EACH ADDITIONAL VESSEL  08/01/2022   IR ANGIOGRAM SELECTIVE EACH ADDITIONAL VESSEL  08/01/2022   IR ANGIOGRAM SELECTIVE EACH ADDITIONAL VESSEL  08/01/2022   IR ANGIOGRAM SELECTIVE EACH ADDITIONAL VESSEL  08/15/2022   IR ANGIOGRAM SELECTIVE EACH ADDITIONAL VESSEL  08/15/2022   IR ANGIOGRAM VISCERAL SELECTIVE  08/01/2022   IR ANGIOGRAM VISCERAL SELECTIVE  08/15/2022   IR CV LINE INJECTION  11/24/2020   IR EMBO TUMOR ORGAN ISCHEMIA INFARCT INC GUIDE ROADMAPPING  08/15/2022   IR IMAGING GUIDED PORT INSERTION  11/08/2020   IR RADIOLOGIST EVAL & MGMT  01/11/2022   IR RADIOLOGIST EVAL & MGMT  04/26/2022   IR RADIOLOGIST EVAL & MGMT  07/03/2022   IR RADIOLOGIST EVAL & MGMT  09/05/2022   IR REMOVAL TUN ACCESS W/ PORT W/O FL MOD SED  12/01/2020   IR US GUIDE VASC ACCESS LEFT  08/01/2022   IR US GUIDE VASC ACCESS LEFT  08/15/2022   IR US GUIDE VASC ACCESS RIGHT  11/29/2021   LAPAROSCOPIC RIGHT COLECTOMY Right 10/15/2020   Procedure: LAPAROSCOPIC RIGHT COLECTOMY, LAPAROSCOPIC LIVER BIOPSY, LAPAROSCOPIC TAP BLOCK;  Surgeon: Gaynelle Adu, MD;  Location: [facility];  Service: General;  Laterality: Right;  90 MINUTES   POLYPECTOMY  10/13/2020   Procedure: POLYPECTOMY;  Surgeon: Bernette Redbird, MD;  Location: [facility];  Service: Endoscopy;;   RADIOLOGY WITH ANESTHESIA N/A 02/22/2022   Procedure: CT WITH ANESTHESIA MICROWAVE  ABLATION;  Surgeon: Bennie Dallas, MD;  Location: [facility];  Service: Radiology;  Laterality: N/A;   TONSILLECTOMY      Allergies: Patient has no known allergies.  Medications: Prior to Admission medications   Medication Sig Start Date End Date Taking? Authorizing Provider  OVER THE COUNTER MEDICATION daily. Nervive - one capsule daily.    [provider]  rivaroxaban (XARELTO) 10 MG TABS tablet Take 1 tablet (10 mg  total) by mouth daily with supper. 10/19/22   [name], MD     No family history on file.  Social History   Socioeconomic History   Marital status: Married    Spouse name: Not on file   Number of children: Not on file   Years of education: Not on file   Highest education level: Not on file  Occupational History   Not on file  Tobacco Use   Smoking status: Former    Current packs/day: 0.00    Average packs/day: 0.5 packs/day for 20.0 years (10.0 ttl pk-yrs)    Types: Cigarettes    Start date: 30    Quit date: 2009    Years since quitting: 15.6   Smokeless tobacco: Never  Vaping Use   Vaping status: Never Used  Substance and Sexual Activity   Alcohol use: Yes    Comment: occ   Drug use: No   Sexual activity: Yes    Birth control/protection: None  Other Topics Concern   Not on file  Social History Narrative   Not on file   Social Determinants of Health   Financial Resource Strain: Low Risk  (11/30/2021)   Overall Financial Resource Strain (CARDIA)    Difficulty of Paying Living Expenses: Not very hard  Food Insecurity: No Food Insecurity (11/30/2021)   Hunger Vital Sign    Worried About Running Out of Food in the Last Year: Never true    Ran Out of Food in the Last Year: Never true  Transportation Needs: Unmet Transportation Needs (11/30/2021)   PRAPARE - Transportation    Lack of Transportation (Medical): Yes    Lack of Transportation (Non-Medical): Yes  Physical Activity: Insufficiently Active (11/30/2021)   Exercise Vital Sign    Days of Exercise per Week: 3 days    Minutes of Exercise per Session: 30 min  Stress: Not on file  Social Connections: Socially Integrated (11/30/2021)   Social Connection and Isolation Panel [NHANES]    Frequency of Communication with Friends and Family: More than three times a week    Frequency of Social Gatherings with Friends and Family: More than three times a week    Attends Religious Services: More than 4 times per year     Active Member of [employer] or Organizations: Yes    Attends Banker Meetings: More than 4 times per year    Marital Status: Married     Vital Signs: LMP 09/22/2020 (Approximate) Comment: Spouse had a vasectomy  No physical examination was performed in lieu of virtual telephone clinic visit.  Imaging: Index MRI (10/11/20)  Seg IV mass    Caudate and right sided masses   12/17/21 MRI Abdomen  2.1 cm segment IV mass    Tiny caudate mass   MRI 04/14/22  Ablation cavity with mild peripheral enhancement.  Decreased size and conspicuity of tiny caudate lobe focus.   PET/CT 05/29/22  Right lobe mass SUV max 9.49, caudate focus SUV max 6.33   MR 06/24/22  Treated ablation cavity  Enhancing region inferior to ablation cavity (4.7 x 2.9 cm)   IR radiation segmentectomy (08/15/22)  20.2 mCi to segment 4B  MRI abdomen 11/20/22  Marked local progression from seg IVB to entire seg IV.   Recurrence of caudate and seg VI masses.  PET 11/20/22     Labs:  CBC: Recent Labs    08/15/22 0739 09/07/22 0757 10/19/22 0740 11/15/22 1229  WBC 5.1 4.7 4.8 5.2  HGB 12.1 12.4 12.5 13.1  HCT 38.0 38.8 38.8 41.3  PLT 261 293 257 307    COAGS: Recent Labs    02/10/22 0948 08/01/22 0757 08/15/22 0739  INR 1.3* 1.3* 1.0    BMP: Recent Labs    08/15/22 0739 09/07/22 0757 10/19/22 0740 11/15/22 1229  NA 140 143 143 142  K 3.8 3.9 4.0 4.2  CL 106 105 107 104  CO2 27 29 28 29   GLUCOSE 108* 110* 100* 117*  BUN 12 12 11 11   CALCIUM 8.8* 9.7 9.5 10.0  CREATININE 0.80 0.78 0.84 0.80  GFRNONAA >60 >60 >60 >60    LIVER FUNCTION TESTS: Recent Labs    08/15/22 0739 09/07/22 0757 10/19/22 0740 11/15/22 1229  BILITOT 0.7 0.6 0.5 0.4  AST 17 15 20 27   ALT 11 10 12 21   ALKPHOS 62 79 78 106  PROT 6.9 6.9 6.6 7.2  ALBUMIN 3.5 3.9 3.9 4.1   CEA:  01/25/22 - 3.7 03/08/22 - 3.6 05/08/22 - 11.0 06/15/22 - 21.6 09/17/22 - 46.88 10/19/22 - 87.65 11/15/22 -  113.12   Assessment and Plan: 57 year old female with a history of stage IV cecal adenocarcinoma with metastases to the liver. She is status post FOLFOXIRI, Avastin and  Xeloda (discontinued 02/2022). She had a 2.1 cm anterior segment IV subcapsular mass with mild enhancement on MRI, now status post microwave ablation on 02/22/22 and radiation segmentectomy on 08/15/22 due to persistent enhancement after ablation.  She has recovered well from the procedure, however unfortunately demonstrates imaging and laboratory evidence of aggressive recurrence and progression of metastatic disease in the liver with rapid expansion of segment 4 mass, now infiltrative in appearance, and recurrence of segment VI and caudate masses.    I unfortunately do not believe there is any further locoregional therapy that can be done.  We could offer ultrasound guided biopsy of the segment VI mass as Dr. Myna Hidalgo has mentioned, if she desires.  I relayed these findings to Lauren Mcpherson.  She would like some time to think about her options and what she would like to do.  She meets with Dr. Myna Hidalgo later this week.    Electronically Signed: Bennie Dallas 12/04/2022, 1:52 PM   I spent a total of 25 Minutes in virtual telephone clinical consultation, greater than 50% of which was counseling/coordinating care for metastatic colon cancer.

## 2022-12-05 DIAGNOSIS — C189 Malignant neoplasm of colon, unspecified: Secondary | ICD-10-CM | POA: Insufficient documentation

## 2022-12-07 ENCOUNTER — Inpatient Hospital Stay (HOSPITAL_BASED_OUTPATIENT_CLINIC_OR_DEPARTMENT_OTHER): Payer: Medicaid Other | Admitting: Hematology & Oncology

## 2022-12-07 ENCOUNTER — Other Ambulatory Visit: Payer: Self-pay

## 2022-12-07 ENCOUNTER — Inpatient Hospital Stay: Payer: Medicaid Other | Attending: Hematology & Oncology

## 2022-12-07 ENCOUNTER — Encounter: Payer: Self-pay | Admitting: Hematology & Oncology

## 2022-12-07 VITALS — BP 115/79 | HR 95 | Temp 98.2°F | Resp 18 | Ht 64.0 in | Wt 262.4 lb

## 2022-12-07 DIAGNOSIS — C787 Secondary malignant neoplasm of liver and intrahepatic bile duct: Secondary | ICD-10-CM | POA: Diagnosis not present

## 2022-12-07 DIAGNOSIS — C180 Malignant neoplasm of cecum: Secondary | ICD-10-CM

## 2022-12-07 DIAGNOSIS — Z86718 Personal history of other venous thrombosis and embolism: Secondary | ICD-10-CM | POA: Insufficient documentation

## 2022-12-07 DIAGNOSIS — Z7901 Long term (current) use of anticoagulants: Secondary | ICD-10-CM | POA: Diagnosis not present

## 2022-12-07 LAB — CMP (CANCER CENTER ONLY)
ALT: 20 U/L (ref 0–44)
AST: 27 U/L (ref 15–41)
Albumin: 4.1 g/dL (ref 3.5–5.0)
Alkaline Phosphatase: 138 U/L — ABNORMAL HIGH (ref 38–126)
Anion gap: 8 (ref 5–15)
BUN: 13 mg/dL (ref 6–20)
CO2: 28 mmol/L (ref 22–32)
Calcium: 9.6 mg/dL (ref 8.9–10.3)
Chloride: 104 mmol/L (ref 98–111)
Creatinine: 0.79 mg/dL (ref 0.44–1.00)
GFR, Estimated: >60 mL/min (ref >60)
Glucose, Bld: 118 mg/dL — ABNORMAL HIGH (ref 70–99)
Potassium: 4 mmol/L (ref 3.5–5.1)
Sodium: 140 mmol/L (ref 135–145)
Total Bilirubin: 0.5 mg/dL (ref 0.3–1.2)
Total Protein: 7.2 g/dL (ref 6.5–8.1)

## 2022-12-07 LAB — CBC WITH DIFFERENTIAL (CANCER CENTER ONLY)
Abs Immature Granulocytes: 0.03 10*3/uL (ref 0.00–0.07)
Basophils Absolute: 0 10*3/uL (ref 0.0–0.1)
Basophils Relative: 1 %
Eosinophils Absolute: 0.4 10*3/uL (ref 0.0–0.5)
Eosinophils Relative: 9 %
HCT: 42.5 % (ref 36.0–46.0)
Hemoglobin: 13.5 g/dL (ref 12.0–15.0)
Immature Granulocytes: 1 %
Lymphocytes Relative: 35 %
Lymphs Abs: 1.6 10*3/uL (ref 0.7–4.0)
MCH: 28.2 pg (ref 26.0–34.0)
MCHC: 31.8 g/dL (ref 30.0–36.0)
MCV: 88.9 fL (ref 80.0–100.0)
Monocytes Absolute: 0.4 10*3/uL (ref 0.1–1.0)
Monocytes Relative: 9 %
Neutro Abs: 2.1 10*3/uL (ref 1.7–7.7)
Neutrophils Relative %: 45 %
Platelet Count: 313 10*3/uL (ref 150–400)
RBC: 4.78 MIL/uL (ref 3.87–5.11)
RDW: 14.6 % (ref 11.5–15.5)
WBC Count: 4.6 10*3/uL (ref 4.0–10.5)
nRBC: 0 % (ref 0.0–0.2)

## 2022-12-07 LAB — CEA (IN HOUSE-CHCC): CEA (CHCC-In House): 198.42 ng/mL — ABNORMAL HIGH (ref 0.00–5.00)

## 2022-12-07 LAB — LACTATE DEHYDROGENASE: LDH: 276 U/L — ABNORMAL HIGH (ref 98–192)

## 2022-12-07 NOTE — Progress Notes (Signed)
Hematology and Oncology Follow Up Visit  Lauren Mcpherson [id number] [birth year] 57 y.o. 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  Thankfully, she was seen by Dr.  Flonnie Hailstone at [facility].  He spoke to me today.  He thought that she would be a candidate for intrahepatic chemotherapy.  He is going to make a referral to one of his colleagues.  He said that they may think about doing systemic chemotherapy initially to see if they get a response.  If there is a response, then he might think about doing surgical resection of her hepatic metastasis and then doing intrahepatic chemotherapy.  She has been on quite a bit of chemotherapy.  Is been quite a while since she has had systemic chemotherapy however.  No she has had FOLFOXIRI.  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  I know she has had thromboembolic disease and is such, Avastin may not be a good idea for her.  She is K-ras mutant so we cannot use EGFR agents.  1 option that I thought about was Lonsurf.  I know this may not have that great of a response rate.  Her last CEA level was 113.  This was back in July.  She is a still in great shape.  She has had no abdominal pain.  There is been no problems with nausea or vomiting.  She has had no problems with bowels or bladder.  There is been no issues with bleeding.  Currently, I would have said that her performance status is probably ECOG 0.  Medications:  Allergies as of  12/07/2022   No Known Allergies      Medication List        Accurate as of December 07, 2022  2:33 PM. If you have any questions, ask your nurse or doctor.          OVER THE COUNTER MEDICATION daily. Nervive - one capsule daily.   rivaroxaban 10 MG Tabs tablet Commonly known as: XARELTO Take 1 tablet (10 mg total) by mouth daily with supper.        Allergies: No Known Allergies  Past Medical History, Surgical history, Social history, and Family History were reviewed and updated.  Review of Systems: Review of Systems  Constitutional: Negative.   HENT: Negative.    Eyes: Negative.   Respiratory: Negative.    Cardiovascular: Negative.   Gastrointestinal: Negative.   Genitourinary: Negative.   Musculoskeletal: Negative.   Skin: Negative.   Neurological: Negative.   Endo/Heme/Allergies: Negative.   Psychiatric/Behavioral: Negative.       Physical Exam: Vital signs show a temperature of 98.2.  Pulse 95.  Blood pressure 115/79.  Weight is 262 pounds.   Wt Readings from Last 3 Encounters:  12/07/22 262 lb 6.4 oz (119 kg)  11/15/22 264 lb (119.7 kg)  10/19/22 264 lb 6.4 oz (119.9 kg)    Physical Exam Vitals reviewed.  HENT:  Head: Normocephalic and atraumatic.  Eyes:     Pupils: Pupils are equal, round, and reactive to light.  Cardiovascular:     Rate and Rhythm: Normal rate and regular rhythm.     Heart sounds: Normal heart sounds.  Pulmonary:     Effort: Pulmonary effort is normal.     Breath sounds: Normal breath sounds.  Abdominal:     General: Bowel sounds are normal.     Palpations: Abdomen is soft.     Comments: Abdominal exam is obese but soft.  She has a laparotomy scar that is well-healed.  There is no fluid wave.  There is no palpable liver or spleen tip.  She has no abdominal mass.  Musculoskeletal:        General: No tenderness or deformity. Normal range of motion.     Cervical back: Normal range of motion.  Lymphadenopathy:      Cervical: No cervical adenopathy.  Skin:    General: Skin is warm and dry.     Findings: No erythema or rash.  Neurological:     Mental Status: She is alert and oriented to person, place, and time.  Psychiatric:        Behavior: Behavior normal.        Thought Content: Thought content normal.        Judgment: Judgment normal.     Lab Results  Component Value Date   WBC 4.6 12/07/2022   HGB 13.5 12/07/2022   HCT 42.5 12/07/2022   MCV 88.9 12/07/2022   PLT 313 12/07/2022   Lab Results  Component Value Date   FERRITIN 312 (H) 12/28/2021   IRON 57 12/28/2021   TIBC 230 (L) 12/28/2021   UIBC 173 12/28/2021   IRONPCTSAT 25 12/28/2021   Lab Results  Component Value Date   RETICCTPCT 1.5 12/14/2021   RBC 4.78 12/07/2022   No results found for: "KPAFRELGTCHN", "LAMBDASER", "KAPLAMBRATIO" No results found for: "IGGSERUM", "IGA", "IGMSERUM" No results found for: "TOTALPROTELP", "ALBUMINELP", "A1GS", "A2GS", "BETS", "BETA2SER", "GAMS", "MSPIKE", "SPEI"   Chemistry      Component Value Date/Time   NA 140 12/07/2022 1331   K 4.0 12/07/2022 1331   CL 104 12/07/2022 1331   CO2 28 12/07/2022 1331   BUN 13 12/07/2022 1331   CREATININE 0.79 12/07/2022 1331      Component Value Date/Time   CALCIUM 9.6 12/07/2022 1331   ALKPHOS 138 (H) 12/07/2022 1331   AST 27 12/07/2022 1331   ALT 20 12/07/2022 1331   BILITOT 0.5 12/07/2022 1331       Impression and Plan: Lauren Mcpherson is a very pleasant 57 yo African American female with metastatic 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    She has been on quite a bit of therapy already.  Hopefully, we can still think about getting her in remission.  Thankfully, her disease has been confined just to her liver.  This certainly is somewhat encouraging.  Again, we will have to see how her appointment goes out with the specialist at [facility].  They will let us know how we can help out with her therapy.  It sounds like she is can be seen  out there next week.  I am sure that they will let us know how we might be able to assist with her treatment.   [name], MD 8/15/20242:33 PM

## 2022-12-26 ENCOUNTER — Encounter: Payer: Self-pay | Admitting: Hematology & Oncology

## 2022-12-26 ENCOUNTER — Other Ambulatory Visit: Payer: Self-pay | Admitting: Hematology & Oncology

## 2022-12-26 DIAGNOSIS — C180 Malignant neoplasm of cecum: Secondary | ICD-10-CM

## 2022-12-26 NOTE — Progress Notes (Signed)
DISCONTINUE ON PATHWAY REGIMEN - Colorectal     A cycle is every 14 days:     Bevacizumab-xxxx      Irinotecan      Oxaliplatin      Leucovorin      Fluorouracil   **Always confirm dose/schedule in your pharmacy ordering system**  REASON: Disease Progression PRIOR TREATMENT: [id number]: FOLFOXIRI + Bevacizumab q14 Days TREATMENT RESPONSE: Partial Response (PR)  START OFF PATHWAY REGIMEN - Other   [id number] + Bevacizumab (Leucovorin IV D1 + Fluorouracil IV D1/CIV D1,2 + Oxaliplatin IV D1 + Bevacizumab IV D1) q14 Days:   A cycle is every 14 days:     Bevacizumab-xxxx      Oxaliplatin      Leucovorin      Fluorouracil      Fluorouracil   **Always confirm dose/schedule in your pharmacy ordering system**  Patient Characteristics: Intent of Therapy: Non-Curative / Palliative Intent, Discussed with Patient

## 2022-12-27 ENCOUNTER — Other Ambulatory Visit: Payer: Self-pay

## 2022-12-28 ENCOUNTER — Other Ambulatory Visit: Payer: Self-pay

## 2022-12-28 ENCOUNTER — Other Ambulatory Visit: Payer: Self-pay | Admitting: Hematology & Oncology

## 2022-12-28 ENCOUNTER — Ambulatory Visit (HOSPITAL_COMMUNITY)
Admission: RE | Admit: 2022-12-28 | Discharge: 2022-12-28 | Disposition: A | Discharge status: Home / Self Care | Payer: PRIVATE HEALTH INSURANCE | Source: Ambulatory Visit | Attending: Hematology & Oncology | Admitting: Hematology & Oncology

## 2022-12-28 DIAGNOSIS — C180 Malignant neoplasm of cecum: Secondary | ICD-10-CM | POA: Insufficient documentation

## 2022-12-28 MED ORDER — LIDOCAINE HCL (PF) 2 % IJ SOLN
10.0000 mL | Freq: Once | INTRAMUSCULAR | Status: AC
  Administered 2022-12-28: 10 mL

## 2022-12-28 MED ORDER — LIDOCAINE HCL 1 % IJ SOLN
INTRAMUSCULAR | Status: AC
  Filled 2022-12-28: qty 20

## 2022-12-28 MED ORDER — HEPARIN SOD (PORK) LOCK FLUSH 100 UNIT/ML IV SOLN
INTRAVENOUS | Status: AC
  Filled 2022-12-28: qty 5

## 2022-12-28 MED ORDER — HEPARIN SOD (PORK) LOCK FLUSH 100 UNIT/ML IV SOLN
500.0000 [IU] | Freq: Once | INTRAVENOUS | Status: AC
  Administered 2022-12-28: 500 [IU] via INTRAVENOUS

## 2022-12-28 NOTE — Procedures (Addendum)
PROCEDURE SUMMARY:  Successful placement of single lumen PICC line to right brachial vein. Length 28 cm Tip at lower SVC/RA No complications PICC capped Ready for use. EBL = trace  Please see full dictation in Imaging section for details.   Brexlee Heberlein S Sharea Guinther PA-C 12/28/2022 1:27 PM

## 2023-01-02 ENCOUNTER — Inpatient Hospital Stay: Payer: No Typology Code available for payment source

## 2023-01-02 ENCOUNTER — Inpatient Hospital Stay (HOSPITAL_BASED_OUTPATIENT_CLINIC_OR_DEPARTMENT_OTHER): Payer: No Typology Code available for payment source | Admitting: Hematology & Oncology

## 2023-01-02 ENCOUNTER — Encounter: Payer: Self-pay | Admitting: Hematology & Oncology

## 2023-01-02 ENCOUNTER — Inpatient Hospital Stay: Payer: No Typology Code available for payment source | Attending: Hematology & Oncology

## 2023-01-02 VITALS — BP 118/81 | HR 89 | Temp 98.0°F | Resp 18 | Wt 258.0 lb

## 2023-01-02 DIAGNOSIS — C180 Malignant neoplasm of cecum: Secondary | ICD-10-CM

## 2023-01-02 DIAGNOSIS — Z7901 Long term (current) use of anticoagulants: Secondary | ICD-10-CM | POA: Diagnosis not present

## 2023-01-02 DIAGNOSIS — Z86711 Personal history of pulmonary embolism: Secondary | ICD-10-CM | POA: Insufficient documentation

## 2023-01-02 DIAGNOSIS — Z5111 Encounter for antineoplastic chemotherapy: Secondary | ICD-10-CM | POA: Diagnosis present

## 2023-01-02 DIAGNOSIS — C787 Secondary malignant neoplasm of liver and intrahepatic bile duct: Secondary | ICD-10-CM | POA: Diagnosis not present

## 2023-01-02 DIAGNOSIS — Z5112 Encounter for antineoplastic immunotherapy: Secondary | ICD-10-CM | POA: Insufficient documentation

## 2023-01-02 LAB — CBC WITH DIFFERENTIAL (CANCER CENTER ONLY)
Abs Immature Granulocytes: 0.01 10*3/uL (ref 0.00–0.07)
Basophils Absolute: 0.1 10*3/uL (ref 0.0–0.1)
Basophils Relative: 1 %
Eosinophils Absolute: 0.3 10*3/uL (ref 0.0–0.5)
Eosinophils Relative: 6 %
HCT: 40.8 % (ref 36.0–46.0)
Hemoglobin: 12.9 g/dL (ref 12.0–15.0)
Immature Granulocytes: 0 %
Lymphocytes Relative: 29 %
Lymphs Abs: 1.6 10*3/uL (ref 0.7–4.0)
MCH: 28 pg (ref 26.0–34.0)
MCHC: 31.6 g/dL (ref 30.0–36.0)
MCV: 88.5 fL (ref 80.0–100.0)
Monocytes Absolute: 0.5 10*3/uL (ref 0.1–1.0)
Monocytes Relative: 9 %
Neutro Abs: 3.1 10*3/uL (ref 1.7–7.7)
Neutrophils Relative %: 55 %
Platelet Count: 280 10*3/uL (ref 150–400)
RBC: 4.61 MIL/uL (ref 3.87–5.11)
RDW: 14.5 % (ref 11.5–15.5)
WBC Count: 5.5 10*3/uL (ref 4.0–10.5)
nRBC: 0 % (ref 0.0–0.2)

## 2023-01-02 LAB — CMP (CANCER CENTER ONLY)
ALT: 14 U/L (ref 0–44)
AST: 25 U/L (ref 15–41)
Albumin: 4 g/dL (ref 3.5–5.0)
Alkaline Phosphatase: 140 U/L — ABNORMAL HIGH (ref 38–126)
Anion gap: 9 (ref 5–15)
BUN: 13 mg/dL (ref 6–20)
CO2: 31 mmol/L (ref 22–32)
Calcium: 9.4 mg/dL (ref 8.9–10.3)
Chloride: 102 mmol/L (ref 98–111)
Creatinine: 0.72 mg/dL (ref 0.44–1.00)
GFR, Estimated: >60 mL/min (ref >60)
Glucose, Bld: 106 mg/dL — ABNORMAL HIGH (ref 70–99)
Potassium: 3.9 mmol/L (ref 3.5–5.1)
Sodium: 142 mmol/L (ref 135–145)
Total Bilirubin: 0.7 mg/dL (ref 0.3–1.2)
Total Protein: 6.8 g/dL (ref 6.5–8.1)

## 2023-01-02 LAB — TOTAL PROTEIN, URINE DIPSTICK: Protein, ur: NEGATIVE mg/dL

## 2023-01-02 MED ORDER — PALONOSETRON HCL INJECTION 0.25 MG/5ML
0.2500 mg | Freq: Once | INTRAVENOUS | Status: AC
  Administered 2023-01-02: 0.25 mg via INTRAVENOUS
  Filled 2023-01-02: qty 5

## 2023-01-02 MED ORDER — DEXTROSE 5 % IV SOLN: Freq: Once | INTRAVENOUS | Status: AC

## 2023-01-02 MED ORDER — FLUOROURACIL CHEMO INJECTION 2.5 GM/50ML
400.0000 mg/m2 | Freq: Once | INTRAVENOUS | Status: AC
  Administered 2023-01-02: 1000 mg via INTRAVENOUS
  Filled 2023-01-02: qty 20

## 2023-01-02 MED ORDER — ONDANSETRON HCL 8 MG PO TABS: 8.0000 mg | ORAL_TABLET | Freq: Three times a day (TID) | ORAL | 1 refills | Status: DC | PRN

## 2023-01-02 MED ORDER — SODIUM CHLORIDE 0.9 % IV SOLN
5.0000 mg/kg | Freq: Once | INTRAVENOUS | Status: AC
  Administered 2023-01-02: 600 mg via INTRAVENOUS
  Filled 2023-01-02: qty 16

## 2023-01-02 MED ORDER — SODIUM CHLORIDE 0.9 % IV SOLN
2400.0000 mg/m2 | INTRAVENOUS | Status: DC
  Administered 2023-01-02: 5550 mg via INTRAVENOUS
  Filled 2023-01-02: qty 111

## 2023-01-02 MED ORDER — ALTEPLASE 2 MG IJ SOLR
2.0000 mg | Freq: Once | INTRAMUSCULAR | Status: AC
  Administered 2023-01-02: 2 mg
  Filled 2023-01-02: qty 2

## 2023-01-02 MED ORDER — DEXAMETHASONE 4 MG PO TABS: 8.0000 mg | ORAL_TABLET | Freq: Every day | ORAL | 1 refills | Status: DC

## 2023-01-02 MED ORDER — PROCHLORPERAZINE MALEATE 10 MG PO TABS: 10.0000 mg | ORAL_TABLET | Freq: Four times a day (QID) | ORAL | 1 refills | Status: DC | PRN

## 2023-01-02 MED ORDER — SODIUM CHLORIDE 0.9 % IV SOLN
10.0000 mg | Freq: Once | INTRAVENOUS | Status: AC
  Administered 2023-01-02: 10 mg via INTRAVENOUS
  Filled 2023-01-02: qty 10

## 2023-01-02 MED ORDER — OXALIPLATIN CHEMO INJECTION 100 MG/20ML
85.0000 mg/m2 | Freq: Once | INTRAVENOUS | Status: AC
  Administered 2023-01-02: 200 mg via INTRAVENOUS
  Filled 2023-01-02: qty 40

## 2023-01-02 MED ORDER — LEUCOVORIN CALCIUM INJECTION 350 MG
400.0000 mg/m2 | Freq: Once | INTRAVENOUS | Status: AC
  Administered 2023-01-02: 928 mg via INTRAVENOUS
  Filled 2023-01-02: qty 46.4

## 2023-01-02 MED ORDER — LEUCOVORIN CALCIUM INJECTION 350 MG
400.0000 mg/m2 | Freq: Once | INTRAVENOUS | Status: DC
  Filled 2023-01-02: qty 46.4

## 2023-01-02 NOTE — Progress Notes (Signed)
Hematology and Oncology Follow Up Visit  Lauren Mcpherson [id number] 1966/04/15 57 y.o. 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  We have not going get her started on systemic chemotherapy.  Hopefully, we will find that she responds so that she can then have intrahepatic therapy.  I will probably give her 4 cycles of chemotherapy with Avastin and then we will repeat her scans.  She had a PICC line placed into the right arm.  Hopefully, we will be able to use this.  She has had no issues with abdominal pain.  Her appetite has been good.  There is no nausea or vomiting.  She has had no cough or shortness of breath.  She has had no bleeding.  She is on Xarelto.  She is doing well with the Xarelto.  She has had no leg swelling.  There is been no rashes.  Of note, her last CEA level done a month ago was 198.  We can clearly use this as a marker of her disease.  Currently, I would say performance status is probably ECOG 0. .  Medications:  Allergies as of 01/02/2023   No Known Allergies      Medication List        Accurate as of January 02, 2023  9:40 AM. If you have any questions, ask your nurse or doctor.          OVER THE COUNTER MEDICATION daily. Nervive - one capsule daily.   rivaroxaban 10 MG Tabs tablet Commonly known as: XARELTO Take 1 tablet (10 mg total) by mouth daily with supper.         Allergies: No Known Allergies  Past Medical History, Surgical history, Social history, and Family History were reviewed and updated.  Review of Systems: Review of Systems  Constitutional: Negative.   HENT: Negative.    Eyes: Negative.   Respiratory: Negative.    Cardiovascular: Negative.   Gastrointestinal: Negative.   Genitourinary: Negative.   Musculoskeletal: Negative.   Skin: Negative.   Neurological: Negative.   Endo/Heme/Allergies: Negative.   Psychiatric/Behavioral: Negative.       Physical Exam: Vital signs show a temperature of 98.  Pulse 89.  Blood pressure 118/81.  Weight is 258 pounds   Wt Readings from Last 3 Encounters:  01/02/23 258 lb (117 kg)  12/07/22 262 lb 6.4 oz (119 kg)  11/15/22 264 lb (119.7 kg)    Physical Exam Vitals reviewed.  HENT:     Head: Normocephalic and atraumatic.  Eyes:     Pupils: Pupils are equal, round, and reactive to light.  Cardiovascular:     Rate and Rhythm: Normal rate and regular rhythm.     Heart sounds: Normal heart sounds.  Pulmonary:     Effort: Pulmonary effort is normal.     Breath sounds: Normal breath sounds.  Abdominal:     General: Bowel sounds  are normal.     Palpations: Abdomen is soft.     Comments: Abdominal exam is obese but soft.  She has a laparotomy scar that is well-healed.  There is no fluid wave.  There is no palpable liver or spleen tip.  She has no abdominal mass.  Musculoskeletal:        General: No tenderness or deformity. Normal range of motion.     Cervical back: Normal range of motion.  Lymphadenopathy:     Cervical: No cervical adenopathy.  Skin:    General: Skin is warm and dry.     Findings: No erythema or rash.  Neurological:     Mental Status: She is alert and oriented to person, place, and time.  Psychiatric:        Behavior: Behavior normal.        Thought Content: Thought content normal.        Judgment: Judgment normal.      Lab Results  Component Value Date   WBC  4.6 12/07/2022   HGB 13.5 12/07/2022   HCT 42.5 12/07/2022   MCV 88.9 12/07/2022   PLT 313 12/07/2022   Lab Results  Component Value Date   FERRITIN 312 (H) 12/28/2021   IRON 57 12/28/2021   TIBC 230 (L) 12/28/2021   UIBC 173 12/28/2021   IRONPCTSAT 25 12/28/2021   Lab Results  Component Value Date   RETICCTPCT 1.5 12/14/2021   RBC 4.78 12/07/2022   No results found for: "KPAFRELGTCHN", "LAMBDASER", "KAPLAMBRATIO" No results found for: "IGGSERUM", "IGA", "IGMSERUM" No results found for: "TOTALPROTELP", "ALBUMINELP", "A1GS", "A2GS", "BETS", "BETA2SER", "GAMS", "MSPIKE", "SPEI"   Chemistry      Component Value Date/Time   NA 140 12/07/2022 1331   K 4.0 12/07/2022 1331   CL 104 12/07/2022 1331   CO2 28 12/07/2022 1331   BUN 13 12/07/2022 1331   CREATININE 0.79 12/07/2022 1331      Component Value Date/Time   CALCIUM 9.6 12/07/2022 1331   ALKPHOS 138 (H) 12/07/2022 1331   AST 27 12/07/2022 1331   ALT 20 12/07/2022 1331   BILITOT 0.5 12/07/2022 1331       Impression and Plan: Lauren Mcpherson is a very pleasant 57 yo African American female with metastatic 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    We will now start her on FOLFOX/Avastin.  I know she has had a history of thromboembolic disease.  However, I really think that Avastin will be helpful for [place].  She is on anticoagulation.  Again I will do 4 cycles of treatment and then we will repeat the scans.  I would think that the CEA level will clearly help [place] with respect to determining response.  We will plan to get her back in 2 weeks.  We will have to get her back 1 day earlier because she has to go to a wedding day that she gets her pump discharged.    [name], MD 9/10/20249:40 AM

## 2023-01-02 NOTE — Progress Notes (Signed)
PICC placement report for patient reveals conflicting information related to tip placement.  Findings:After catheter placement, the tip lies within the proximal axillary vein / distal subclavian vein The catheter aspirates and flushes normally and is ready for immediate use. Impression: The tip of the catheter is positioned at the superior cavo-atrial junction. The PICC line is ready for immediate use.   Per Dr Myna Hidalgo, after reviewing fluoro images and note, OK to proceed with treatment as scheduled today. Will continue to monitor blood return with tri-weekly flush. dph

## 2023-01-04 ENCOUNTER — Inpatient Hospital Stay: Payer: No Typology Code available for payment source

## 2023-01-04 VITALS — BP 116/85 | HR 91 | Temp 98.0°F | Resp 18

## 2023-01-04 DIAGNOSIS — Z5112 Encounter for antineoplastic immunotherapy: Secondary | ICD-10-CM | POA: Diagnosis not present

## 2023-01-04 DIAGNOSIS — C180 Malignant neoplasm of cecum: Secondary | ICD-10-CM

## 2023-01-04 MED ORDER — SODIUM CHLORIDE 0.9% FLUSH
10.0000 mL | INTRAVENOUS | Status: DC | PRN
  Administered 2023-01-04: 10 mL

## 2023-01-04 MED ORDER — HEPARIN SOD (PORK) LOCK FLUSH 100 UNIT/ML IV SOLN
500.0000 [IU] | Freq: Once | INTRAVENOUS | Status: AC | PRN
  Administered 2023-01-04: 250 [IU]

## 2023-01-08 ENCOUNTER — Inpatient Hospital Stay: Payer: No Typology Code available for payment source

## 2023-01-08 VITALS — BP 112/74 | HR 80 | Temp 98.7°F | Resp 20

## 2023-01-08 DIAGNOSIS — Z5112 Encounter for antineoplastic immunotherapy: Secondary | ICD-10-CM | POA: Diagnosis not present

## 2023-01-08 DIAGNOSIS — C180 Malignant neoplasm of cecum: Secondary | ICD-10-CM

## 2023-01-08 MED ORDER — SODIUM CHLORIDE 0.9% FLUSH
10.0000 mL | Freq: Once | INTRAVENOUS | Status: AC
  Administered 2023-01-08: 10 mL

## 2023-01-08 MED ORDER — HEPARIN SOD (PORK) LOCK FLUSH 100 UNIT/ML IV SOLN
500.0000 [IU] | Freq: Once | INTRAVENOUS | Status: AC
  Administered 2023-01-08: 500 [IU]

## 2023-01-08 NOTE — Patient Instructions (Signed)
PICC Home Care Guide A peripherally inserted central catheter (PICC) is a form of IV access that allows medicines and IV fluids to be quickly put into the blood and spread throughout the body. The PICC is a long, thin, flexible tube (catheter) that is put into a vein in a person's arm or leg. The catheter ends in a large vein just outside the heart called the superior vena cava (SVC). After the PICC is put in, a chest X-ray may be done to make sure that it is in the right place. A PICC may be placed for different reasons, such as: To give medicines and liquid nutrition. To give IV fluids and blood products. To take blood samples often. If there is trouble placing a peripheral intravenous (PIV) catheter. If cared for properly, a PICC can remain in place for many months. Having a PICC can allow you to go home from the hospital sooner and continue treatment at home. Medicines and PICC care can be managed at home by a family member, caregiver, or home health care team. What are the risks? Generally, having a PICC is safe. However, problems may occur, including: A blood clot (thrombus) forming in or at the end of the PICC. A blood clot forming in a vein (deep vein thrombosis) or traveling to the lung (pulmonary embolism). Inflammation of the vein (phlebitis) in which the PICC is placed. Infection at the insertion site or in the blood. Blood infections from central lines, like PICCs, can be serious and often require a hospital stay. PICC malposition, or PICC movement or poor placement. A break or cut in the PICC. Do not use scissors near the PICC. Nerve or tendon irritation or injury during PICC insertion. How to care for your PICC Please follow the specific guidelines provided by your health care provider. Preventing infection You and any caregivers should wash your hands often with soap and water for at least 20 seconds. Wash hands: Before touching the PICC or the infusion device. Before changing a  bandage (dressing). Do not change the dressing unless you have been taught to do so and have shown you are able to change it safely. Flush the PICC as told. Tell your health care provider right away if the PICC is hard to flush or does not flush. Do not use force to flush the PICC. Use clean and germ-free (sterile) supplies only. Keep the supplies in a dry place. Do not reuse needles, syringes, or any other supplies. Reusing supplies can lead to infection. Keep the PICC dressing dry and secure it with tape if the edges stop sticking to your skin. Check your PICC insertion site every day for signs of infection. Check for: Redness, swelling, or pain. Fluid or blood. Warmth. Pus or a bad smell. Preventing other problems Do not use a syringe that is less than 10 mL to flush the PICC. Do not have your blood pressure checked on the arm in which the PICC is placed. Do not ever pull or tug on the PICC. Keep it secured to your arm with tape or a stretch wrap when not in use. Do not take the PICC out yourself. Only a trained health care provider should remove the PICC. Keep pets and children away from your PICC. How to care for your PICC dressing Keep your PICC dressing clean and dry to prevent infection. Do not take baths, swim, or use a hot tub until your health care provider approves. Ask your health care provider if you can take  showers. You may only be allowed to take sponge baths. When you are allowed to shower: Ask your health care provider to teach you how to wrap the PICC. Cover the PICC with clear plastic wrap and tape to keep it dry while showering. Follow instructions from your health care provider about how to take care of your insertion site and dressing. Make sure you: Wash your hands with soap and water for at least 20 seconds before and after you change your dressing. If soap and water are not available, use hand sanitizer. Change your dressing only if taught to do so by your health care  provider. Your PICC dressing needs to be changed if it becomes loose or wet. Leave stitches (sutures), skin glue, or adhesive strips in place. These skin closures may need to stay in place for 2 weeks or longer. If adhesive strip edges start to loosen and curl up, you may trim the loose edges. Do not remove adhesive strips completely unless your health care provider tells you to do that. Follow these instructions at home: Disposal of supplies Throw away any syringes in a disposal container that is meant for sharp items (sharps container). You can buy a sharps container from a pharmacy, or you can make one by using an empty, hard plastic bottle with a lid. Place any used dressings or infusion bags into a plastic bag. Throw that bag in the trash. General instructions  Always carry your PICC identification card or wear a medical alert bracelet. Keep the tube clamped at all times, unless it is being used. Always carry a smooth-edge clamp with you to clamp the PICC if it breaks. Do not use scissors or sharp objects near the tube. You may bend your arm and move it freely. If your PICC is near or at the bend of your elbow, avoid activity with repeated motion at the elbow. Avoid lifting heavy objects as told by your health care provider. Keep all follow-up visits. This is important. You will need to have your PICC dressing changed at least once a week. Contact a health care provider if: You have pain in your arm, ear, face, or teeth. You have a fever or chills. You have redness, swelling, or pain around the insertion site. You have fluid or blood coming from the insertion site. Your insertion site feels warm to the touch. You have pus or a bad smell coming from the insertion site. Your skin feels hard and raised around the insertion site. Your PICC dressing has gotten wet or is coming off and you have not been taught how to change it. Get help right away if: You have problems with your PICC, such as  your PICC: Was tugged or pulled and has partially come out. Do not  push the PICC back in. Cannot be flushed, is hard to flush, or leaks around the insertion site when it is flushed. Makes a flushing sound when it is flushed. Appears to have a hole or tear. Is accidentally pulled all the way out. If this happens, cover the insertion site with a gauze dressing. Do not throw the PICC away. Your health care provider will need to check it to be sure the entire catheter came out. You feel your heart racing or skipping beats, or you have chest pain. You have shortness of breath or trouble breathing. You have swelling, redness, warmth, or pain in the arm in which the PICC is placed. You have a red streak going up your arm that  starts under the PICC dressing. These symptoms may be an emergency. Get help right away. Call 911. Do not wait to see if the symptoms will go away. Do not drive yourself to the hospital. Summary A peripherally inserted central catheter (PICC) is a long, thin, flexible tube (catheter) that is put into a vein in the arm or leg. If cared for properly, a PICC can remain in place for many months. Having a PICC can allow you to go home from the hospital sooner and continue treatment at home. The PICC is inserted using a germ-free (sterile) technique by a specially trained health care provider. Only a trained health care provider should remove it. Do not have your blood pressure checked on the arm in which your PICC is placed. Always keep your PICC identification card with you. This information is not intended to replace advice given to you by your health care provider. Make sure you discuss any questions you have with your health care provider. Document Revised: 10/27/2020 Document Reviewed: 10/27/2020 Elsevier Patient Education  2024 [employer].

## 2023-01-09 ENCOUNTER — Other Ambulatory Visit: Payer: Self-pay

## 2023-01-10 ENCOUNTER — Inpatient Hospital Stay: Payer: No Typology Code available for payment source

## 2023-01-10 VITALS — BP 120/68 | HR 82 | Temp 98.5°F | Resp 18

## 2023-01-10 DIAGNOSIS — Z452 Encounter for adjustment and management of vascular access device: Secondary | ICD-10-CM

## 2023-01-10 DIAGNOSIS — Z5112 Encounter for antineoplastic immunotherapy: Secondary | ICD-10-CM | POA: Diagnosis not present

## 2023-01-10 MED ORDER — HEPARIN SOD (PORK) LOCK FLUSH 100 UNIT/ML IV SOLN
250.0000 [IU] | Freq: Once | INTRAVENOUS | Status: AC
  Administered 2023-01-10: 250 [IU] via INTRAVENOUS

## 2023-01-10 MED ORDER — SODIUM CHLORIDE 0.9% FLUSH
10.0000 mL | Freq: Once | INTRAVENOUS | Status: AC
  Administered 2023-01-10: 10 mL via INTRAVENOUS

## 2023-01-11 ENCOUNTER — Other Ambulatory Visit: Payer: Self-pay | Admitting: *Deleted

## 2023-01-11 DIAGNOSIS — C180 Malignant neoplasm of cecum: Secondary | ICD-10-CM

## 2023-01-12 ENCOUNTER — Inpatient Hospital Stay: Payer: No Typology Code available for payment source

## 2023-01-12 ENCOUNTER — Inpatient Hospital Stay (HOSPITAL_BASED_OUTPATIENT_CLINIC_OR_DEPARTMENT_OTHER): Payer: No Typology Code available for payment source | Admitting: Family

## 2023-01-12 ENCOUNTER — Encounter: Payer: Self-pay | Admitting: Family

## 2023-01-12 ENCOUNTER — Other Ambulatory Visit: Payer: Self-pay

## 2023-01-12 DIAGNOSIS — C180 Malignant neoplasm of cecum: Secondary | ICD-10-CM

## 2023-01-12 DIAGNOSIS — Z452 Encounter for adjustment and management of vascular access device: Secondary | ICD-10-CM

## 2023-01-12 DIAGNOSIS — Z5112 Encounter for antineoplastic immunotherapy: Secondary | ICD-10-CM | POA: Diagnosis not present

## 2023-01-12 LAB — CBC WITH DIFFERENTIAL (CANCER CENTER ONLY)
Abs Immature Granulocytes: 0.01 10*3/uL (ref 0.00–0.07)
Basophils Absolute: 0 10*3/uL (ref 0.0–0.1)
Basophils Relative: 1 %
Eosinophils Absolute: 0.1 10*3/uL (ref 0.0–0.5)
Eosinophils Relative: 3 %
HCT: 35 % — ABNORMAL LOW (ref 36.0–46.0)
Hemoglobin: 11.4 g/dL — ABNORMAL LOW (ref 12.0–15.0)
Immature Granulocytes: 0 %
Lymphocytes Relative: 35 %
Lymphs Abs: 1.5 10*3/uL (ref 0.7–4.0)
MCH: 28.3 pg (ref 26.0–34.0)
MCHC: 32.6 g/dL (ref 30.0–36.0)
MCV: 86.8 fL (ref 80.0–100.0)
Monocytes Absolute: 0.4 10*3/uL (ref 0.1–1.0)
Monocytes Relative: 8 %
Neutro Abs: 2.2 10*3/uL (ref 1.7–7.7)
Neutrophils Relative %: 53 %
Platelet Count: 208 10*3/uL (ref 150–400)
RBC: 4.03 MIL/uL (ref 3.87–5.11)
RDW: 13.9 % (ref 11.5–15.5)
WBC Count: 4.2 10*3/uL (ref 4.0–10.5)
nRBC: 0 % (ref 0.0–0.2)

## 2023-01-12 LAB — CMP (CANCER CENTER ONLY)
ALT: 16 U/L (ref 0–44)
AST: 22 U/L (ref 15–41)
Albumin: 3.5 g/dL (ref 3.5–5.0)
Alkaline Phosphatase: 120 U/L (ref 38–126)
Anion gap: 9 (ref 5–15)
BUN: 12 mg/dL (ref 6–20)
CO2: 27 mmol/L (ref 22–32)
Calcium: 8.8 mg/dL — ABNORMAL LOW (ref 8.9–10.3)
Chloride: 104 mmol/L (ref 98–111)
Creatinine: 0.68 mg/dL (ref 0.44–1.00)
GFR, Estimated: >60 mL/min (ref >60)
Glucose, Bld: 139 mg/dL — ABNORMAL HIGH (ref 70–99)
Potassium: 3.9 mmol/L (ref 3.5–5.1)
Sodium: 140 mmol/L (ref 135–145)
Total Bilirubin: 0.4 mg/dL (ref 0.3–1.2)
Total Protein: 6.6 g/dL (ref 6.5–8.1)

## 2023-01-12 LAB — CEA (ACCESS): CEA (CHCC): 476.53 ng/mL — ABNORMAL HIGH (ref 0.00–5.00)

## 2023-01-12 MED ORDER — HEPARIN SOD (PORK) LOCK FLUSH 100 UNIT/ML IV SOLN
250.0000 [IU] | Freq: Once | INTRAVENOUS | Status: AC
  Administered 2023-01-12: 250 [IU] via INTRAVENOUS

## 2023-01-12 MED ORDER — SODIUM CHLORIDE 0.9% FLUSH
10.0000 mL | Freq: Once | INTRAVENOUS | Status: AC
  Administered 2023-01-12: 10 mL via INTRAVENOUS

## 2023-01-12 NOTE — Progress Notes (Signed)
Hematology and Oncology Follow Up Visit  ALAHNA TERZIAN [id number] 1965-09-27 57 y.o. 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 Mierzwa is here today for follow-up and PICC line flush. She is scheduled for treatment on Monday next week.  She is doing well and has no complaints at this time.  She states that her first cycle of FOLFOX/Avastin went well. She had a little nausea for a day or so after but has her antiemetics to start early today.  No fever, chills, n/v, cough, rash, dizziness, SOB, chest pain, palpitations, abdominal pain or changes in bowel or bladder habits.  No swelling in her extremities.  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  No falls or syncope reported.  Appetite and hydration are good. Weight is stable at 258 lbs.   ECOG Performance Status: 1 - Symptomatic but completely ambulatory  Medications:  Allergies as of 01/12/2023   No Known Allergies      Medication List        Accurate as of January 12, 2023  9:57 AM. If you have any questions, ask your nurse or doctor.          dexamethasone 4 MG tablet Commonly known as: DECADRON Take 2 tablets (8 mg total) by mouth daily. Start the day after chemotherapy for 2 days. Take with food.   ondansetron 8 MG tablet Commonly known as: Zofran Take 1 tablet (8 mg total) by mouth every 8 (eight) hours as needed for nausea or vomiting. Start on the third day after  chemotherapy.   OVER THE COUNTER MEDICATION daily. Nervive - one capsule daily.   prochlorperazine 10 MG tablet Commonly known as: COMPAZINE Take 1 tablet (10 mg total) by mouth every 6 (six) hours as needed for nausea or vomiting.   rivaroxaban 10 MG Tabs tablet Commonly known as: XARELTO Take 1 tablet (10 mg total) by mouth daily with supper.        Allergies: No Known Allergies  Past Medical History, Surgical history, Social history, and Family History were reviewed and updated.  Review of Systems: All other 10 point review of systems is negative.   Physical Exam:  height is 5\' 4"  (1.626 m) and weight is 258 lb (117 kg). Her oral temperature is 98.2 F (36.8 C). Her blood pressure is 105/77 and her pulse is 77. Her respiration is 18 and oxygen saturation is 100%.   Wt Readings from Last 3 Encounters:  01/12/23 258 lb (117 kg)  01/02/23 258 lb (117 kg)  12/07/22 262 lb 6.4 oz (119 kg)    Ocular: Sclerae unicteric, pupils equal, round and reactive to light Ear-nose-throat: Oropharynx clear, dentition fair Lymphatic: No cervical or supraclavicular adenopathy Lungs no rales or rhonchi, good excursion bilaterally Heart regular rate and rhythm, no murmur appreciated Abd soft, nontender, positive bowel sounds MSK no focal spinal tenderness, no joint edema Neuro: non-focal, well-oriented, appropriate affect Breasts: Deferred  Lab Results  Component Value Date   WBC 5.5 01/02/2023   HGB 12.9 01/02/2023   HCT 40.8 01/02/2023   MCV 88.5 01/02/2023   PLT 280 01/02/2023   Lab Results  Component Value Date   FERRITIN 312 (H) 12/28/2021   IRON 57 12/28/2021   TIBC 230 (L) 12/28/2021   UIBC 173 12/28/2021   IRONPCTSAT 25 12/28/2021   Lab Results  Component Value Date   RETICCTPCT 1.5 12/14/2021   RBC 4.61 01/02/2023   No results found for: "KPAFRELGTCHN", "LAMBDASER", "KAPLAMBRATIO" No results found for: "IGGSERUM", "IGA", "IGMSERUM" No results found for:  "TOTALPROTELP", "ALBUMINELP", "A1GS", "A2GS", "BETS", "BETA2SER", "GAMS", "MSPIKE", "SPEI"   Chemistry      Component Value Date/Time   NA 142 01/02/2023 1030   K 3.9 01/02/2023 1030   CL 102 01/02/2023 1030   CO2 31 01/02/2023 1030   BUN 13 01/02/2023 1030   CREATININE 0.72 01/02/2023 1030      Component Value Date/Time   CALCIUM 9.4 01/02/2023 1030   ALKPHOS 140 (H) 01/02/2023 1030   AST 25 01/02/2023 1030   ALT 14 01/02/2023 1030   BILITOT 0.7 01/02/2023 1030       Impression and Plan: Ms. Felder is a very pleasant 57 yo African American female with metastatic 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   She will start cycle 2 of FOLFOX/Avastin Monday 9/23.    We will plan to complete 4 cycles of treatment and then repeat the scans.  CEA pending.   PICC flush 3 days a week, MWF.  Follow-up in 2 weeks.   Eileen Stanford, NP 9/20/20249:57 AM

## 2023-01-12 NOTE — Patient Instructions (Signed)

## 2023-01-15 ENCOUNTER — Other Ambulatory Visit: Payer: Self-pay | Admitting: Hematology & Oncology

## 2023-01-15 ENCOUNTER — Inpatient Hospital Stay: Payer: PRIVATE HEALTH INSURANCE

## 2023-01-15 ENCOUNTER — Inpatient Hospital Stay: Payer: No Typology Code available for payment source

## 2023-01-15 ENCOUNTER — Inpatient Hospital Stay: Payer: No Typology Code available for payment source | Admitting: Family

## 2023-01-15 VITALS — BP 112/73 | HR 75 | Temp 98.3°F | Resp 17

## 2023-01-15 DIAGNOSIS — C180 Malignant neoplasm of cecum: Secondary | ICD-10-CM

## 2023-01-15 DIAGNOSIS — Z5112 Encounter for antineoplastic immunotherapy: Secondary | ICD-10-CM | POA: Diagnosis not present

## 2023-01-15 MED ORDER — SODIUM CHLORIDE 0.9 % IV SOLN: Freq: Once | INTRAVENOUS | Status: AC

## 2023-01-15 MED ORDER — SODIUM CHLORIDE 0.9 % IV SOLN
5.0000 mg/kg | Freq: Once | INTRAVENOUS | Status: AC
  Administered 2023-01-15: 600 mg via INTRAVENOUS
  Filled 2023-01-15: qty 16

## 2023-01-15 MED ORDER — OXALIPLATIN CHEMO INJECTION 100 MG/20ML
85.0000 mg/m2 | Freq: Once | INTRAVENOUS | Status: AC
  Administered 2023-01-15: 200 mg via INTRAVENOUS
  Filled 2023-01-15: qty 40

## 2023-01-15 MED ORDER — LEUCOVORIN CALCIUM INJECTION 350 MG
400.0000 mg/m2 | Freq: Once | INTRAVENOUS | Status: AC
  Administered 2023-01-15: 928 mg via INTRAVENOUS
  Filled 2023-01-15: qty 46.4

## 2023-01-15 MED ORDER — HEPARIN SOD (PORK) LOCK FLUSH 100 UNIT/ML IV SOLN: 500.0000 [IU] | Freq: Once | INTRAVENOUS | Status: DC | PRN

## 2023-01-15 MED ORDER — ALTEPLASE 2 MG IJ SOLR
2.0000 mg | Freq: Once | INTRAMUSCULAR | Status: AC
  Administered 2023-01-15: 2 mg
  Filled 2023-01-15: qty 2

## 2023-01-15 MED ORDER — FLUOROURACIL CHEMO INJECTION 2.5 GM/50ML
400.0000 mg/m2 | Freq: Once | INTRAVENOUS | Status: AC
  Administered 2023-01-15: 1000 mg via INTRAVENOUS
  Filled 2023-01-15: qty 20

## 2023-01-15 MED ORDER — DEXTROSE 5 % IV SOLN: Freq: Once | INTRAVENOUS | Status: DC

## 2023-01-15 MED ORDER — SODIUM CHLORIDE 0.9 % IV SOLN
2400.0000 mg/m2 | INTRAVENOUS | Status: DC
  Administered 2023-01-15: 5550 mg via INTRAVENOUS
  Filled 2023-01-15: qty 111

## 2023-01-15 MED ORDER — PALONOSETRON HCL INJECTION 0.25 MG/5ML
0.2500 mg | Freq: Once | INTRAVENOUS | Status: AC
  Administered 2023-01-15: 0.25 mg via INTRAVENOUS
  Filled 2023-01-15: qty 5

## 2023-01-15 MED ORDER — SODIUM CHLORIDE 0.9 % IV SOLN
10.0000 mg | Freq: Once | INTRAVENOUS | Status: AC
  Administered 2023-01-15: 10 mg via INTRAVENOUS
  Filled 2023-01-15: qty 10

## 2023-01-15 MED ORDER — SODIUM CHLORIDE 0.9% FLUSH: 10.0000 mL | INTRAVENOUS | Status: DC | PRN

## 2023-01-15 NOTE — Patient Instructions (Addendum)
[facility]  Discharge Instructions: Thank you for choosing [facility] to provide your oncology and hematology care.   If you have a lab appointment with the Cancer Center, please go directly to the Cancer Center and check in at the registration area.  Wear comfortable clothing and clothing appropriate for easy access to any Portacath or PICC line.   We strive to give you quality time with your provider. You may need to reschedule your appointment if you arrive late (15 or more minutes).  Arriving late affects you and other patients whose appointments are after yours.  Also, if you miss three or more appointments without notifying the office, you may be dismissed from the clinic at the provider's discretion.      For prescription refill requests, have your pharmacy contact our office and allow 72 hours for refills to be completed.    Today you received the following chemotherapy and/or immunotherapy agents \\[id number] [phone] To help prevent nausea and vomiting after your treatment, we encourage you to take your nausea medication as directed.  BELOW ARE SYMPTOMS THAT SHOULD BE REPORTED IMMEDIATELY: *FEVER GREATER THAN 100.4 F (38 C) OR HIGHER *CHILLS OR SWEATING *NAUSEA AND VOMITING THAT IS NOT CONTROLLED WITH YOUR NAUSEA MEDICATION *UNUSUAL SHORTNESS OF BREATH *UNUSUAL BRUISING OR BLEEDING *URINARY PROBLEMS (pain or burning when urinating, or frequent urination) *BOWEL PROBLEMS (unusual diarrhea, constipation, pain near the anus) TENDERNESS IN MOUTH AND THROAT WITH OR WITHOUT PRESENCE OF ULCERS (sore throat, sores in mouth, or a toothache) UNUSUAL RASH, SWELLING OR PAIN  UNUSUAL VAGINAL DISCHARGE OR ITCHING   Items with * indicate a potential emergency and should be followed up as soon as possible or go to the Emergency Department if any problems should occur.  Please show the CHEMOTHERAPY ALERT CARD or  IMMUNOTHERAPY ALERT CARD at check-in to the Emergency Department and triage nurse. Should you have questions after your visit or need to cancel or reschedule your appointment, please contact [facility] [place]  [phone] and follow the prompts.  Office hours are 8:00 a.m. to 4:30 p.m. Monday - Friday. Please note that voicemails left after 4:00 p.m. may not be returned until the following business day.  We are closed weekends and major holidays. You have access to a nurse at all times for urgent questions. Please call the main number to the clinic [phone] and follow the prompts.  For any non-urgent questions, you may also contact your provider using MyChart. We now offer e-Visits for anyone 69 and older to request care online for non-urgent symptoms. For details visit [email or web address].   Also download the MyChart app! Go to the app store, search "MyChart", open the app, select [facility], and log in with your MyChart username and password.

## 2023-01-17 ENCOUNTER — Inpatient Hospital Stay: Payer: PRIVATE HEALTH INSURANCE

## 2023-01-17 VITALS — BP 97/79 | HR 59 | Temp 98.6°F

## 2023-01-17 DIAGNOSIS — C180 Malignant neoplasm of cecum: Secondary | ICD-10-CM

## 2023-01-17 DIAGNOSIS — Z5112 Encounter for antineoplastic immunotherapy: Secondary | ICD-10-CM | POA: Diagnosis not present

## 2023-01-17 MED ORDER — HEPARIN SOD (PORK) LOCK FLUSH 100 UNIT/ML IV SOLN
250.0000 [IU] | Freq: Once | INTRAVENOUS | Status: AC | PRN
  Administered 2023-01-17: 250 [IU]

## 2023-01-17 MED ORDER — SODIUM CHLORIDE 0.9% FLUSH
10.0000 mL | INTRAVENOUS | Status: DC | PRN
  Administered 2023-01-17: 10 mL

## 2023-01-18 ENCOUNTER — Encounter: Payer: Self-pay | Admitting: Hematology & Oncology

## 2023-01-18 ENCOUNTER — Encounter: Payer: Self-pay | Admitting: Family

## 2023-01-19 ENCOUNTER — Encounter: Payer: Self-pay | Admitting: *Deleted

## 2023-01-19 ENCOUNTER — Inpatient Hospital Stay: Payer: No Typology Code available for payment source

## 2023-01-19 NOTE — Patient Instructions (Signed)
PICC Home Care Guide A peripherally inserted central catheter (PICC) is a form of IV access that allows medicines and IV fluids to be quickly put into the blood and spread throughout the body. The PICC is a long, thin, flexible tube (catheter) that is put into a vein in a person's arm or leg. The catheter ends in a large vein just outside the heart called the superior vena cava (SVC). After the PICC is put in, a chest X-ray may be done to make sure that it is in the right place. A PICC may be placed for different reasons, such as: To give medicines and liquid nutrition. To give IV fluids and blood products. To take blood samples often. If there is trouble placing a peripheral intravenous (PIV) catheter. If cared for properly, a PICC can remain in place for many months. Having a PICC can allow you to go home from the hospital sooner and continue treatment at home. Medicines and PICC care can be managed at home by a family member, caregiver, or home health care team. What are the risks? Generally, having a PICC is safe. However, problems may occur, including: A blood clot (thrombus) forming in or at the end of the PICC. A blood clot forming in a vein (deep vein thrombosis) or traveling to the lung (pulmonary embolism). Inflammation of the vein (phlebitis) in which the PICC is placed. Infection at the insertion site or in the blood. Blood infections from central lines, like PICCs, can be serious and often require a hospital stay. PICC malposition, or PICC movement or poor placement. A break or cut in the PICC. Do not use scissors near the PICC. Nerve or tendon irritation or injury during PICC insertion. How to care for your PICC Please follow the specific guidelines provided by your health care provider. Preventing infection You and any caregivers should wash your hands often with soap and water for at least 20 seconds. Wash hands: Before touching the PICC or the infusion device. Before changing a  bandage (dressing). Do not change the dressing unless you have been taught to do so and have shown you are able to change it safely. Flush the PICC as told. Tell your health care provider right away if the PICC is hard to flush or does not flush. Do not use force to flush the PICC. Use clean and germ-free (sterile) supplies only. Keep the supplies in a dry place. Do not reuse needles, syringes, or any other supplies. Reusing supplies can lead to infection. Keep the PICC dressing dry and secure it with tape if the edges stop sticking to your skin. Check your PICC insertion site every day for signs of infection. Check for: Redness, swelling, or pain. Fluid or blood. Warmth. Pus or a bad smell. Preventing other problems Do not use a syringe that is less than 10 mL to flush the PICC. Do not have your blood pressure checked on the arm in which the PICC is placed. Do not ever pull or tug on the PICC. Keep it secured to your arm with tape or a stretch wrap when not in use. Do not take the PICC out yourself. Only a trained health care provider should remove the PICC. Keep pets and children away from your PICC. How to care for your PICC dressing Keep your PICC dressing clean and dry to prevent infection. Do not take baths, swim, or use a hot tub until your health care provider approves. Ask your health care provider if you can take  showers. You may only be allowed to take sponge baths. When you are allowed to shower: Ask your health care provider to teach you how to wrap the PICC. Cover the PICC with clear plastic wrap and tape to keep it dry while showering. Follow instructions from your health care provider about how to take care of your insertion site and dressing. Make sure you: Wash your hands with soap and water for at least 20 seconds before and after you change your dressing. If soap and water are not available, use hand sanitizer. Change your dressing only if taught to do so by your health care  provider. Your PICC dressing needs to be changed if it becomes loose or wet. Leave stitches (sutures), skin glue, or adhesive strips in place. These skin closures may need to stay in place for 2 weeks or longer. If adhesive strip edges start to loosen and curl up, you may trim the loose edges. Do not remove adhesive strips completely unless your health care provider tells you to do that. Follow these instructions at home: Disposal of supplies Throw away any syringes in a disposal container that is meant for sharp items (sharps container). You can buy a sharps container from a pharmacy, or you can make one by using an empty, hard plastic bottle with a lid. Place any used dressings or infusion bags into a plastic bag. Throw that bag in the trash. General instructions  Always carry your PICC identification card or wear a medical alert bracelet. Keep the tube clamped at all times, unless it is being used. Always carry a smooth-edge clamp with you to clamp the PICC if it breaks. Do not use scissors or sharp objects near the tube. You may bend your arm and move it freely. If your PICC is near or at the bend of your elbow, avoid activity with repeated motion at the elbow. Avoid lifting heavy objects as told by your health care provider. Keep all follow-up visits. This is important. You will need to have your PICC dressing changed at least once a week. Contact a health care provider if: You have pain in your arm, ear, face, or teeth. You have a fever or chills. You have redness, swelling, or pain around the insertion site. You have fluid or blood coming from the insertion site. Your insertion site feels warm to the touch. You have pus or a bad smell coming from the insertion site. Your skin feels hard and raised around the insertion site. Your PICC dressing has gotten wet or is coming off and you have not been taught how to change it. Get help right away if: You have problems with your PICC, such as  your PICC: Was tugged or pulled and has partially come out. Do not  push the PICC back in. Cannot be flushed, is hard to flush, or leaks around the insertion site when it is flushed. Makes a flushing sound when it is flushed. Appears to have a hole or tear. Is accidentally pulled all the way out. If this happens, cover the insertion site with a gauze dressing. Do not throw the PICC away. Your health care provider will need to check it to be sure the entire catheter came out. You feel your heart racing or skipping beats, or you have chest pain. You have shortness of breath or trouble breathing. You have swelling, redness, warmth, or pain in the arm in which the PICC is placed. You have a red streak going up your arm that  starts under the PICC dressing. These symptoms may be an emergency. Get help right away. Call 911. Do not wait to see if the symptoms will go away. Do not drive yourself to the hospital. Summary A peripherally inserted central catheter (PICC) is a long, thin, flexible tube (catheter) that is put into a vein in the arm or leg. If cared for properly, a PICC can remain in place for many months. Having a PICC can allow you to go home from the hospital sooner and continue treatment at home. The PICC is inserted using a germ-free (sterile) technique by a specially trained health care provider. Only a trained health care provider should remove it. Do not have your blood pressure checked on the arm in which your PICC is placed. Always keep your PICC identification card with you. This information is not intended to replace advice given to you by your health care provider. Make sure you discuss any questions you have with your health care provider. Document Revised: 10/27/2020 Document Reviewed: 10/27/2020 Elsevier Patient Education  2024 [employer].

## 2023-01-22 ENCOUNTER — Inpatient Hospital Stay: Payer: No Typology Code available for payment source

## 2023-01-22 VITALS — BP 124/83 | HR 81 | Temp 97.8°F | Resp 17

## 2023-01-22 DIAGNOSIS — C180 Malignant neoplasm of cecum: Secondary | ICD-10-CM

## 2023-01-22 DIAGNOSIS — Z5112 Encounter for antineoplastic immunotherapy: Secondary | ICD-10-CM | POA: Diagnosis not present

## 2023-01-22 MED ORDER — HEPARIN SOD (PORK) LOCK FLUSH 100 UNIT/ML IV SOLN
500.0000 [IU] | Freq: Once | INTRAVENOUS | Status: AC
  Administered 2023-01-22: 250 [IU]

## 2023-01-22 MED ORDER — SODIUM CHLORIDE 0.9% FLUSH
10.0000 mL | Freq: Once | INTRAVENOUS | Status: AC
  Administered 2023-01-22: 10 mL

## 2023-01-22 NOTE — Patient Instructions (Signed)
PICC Home Care Guide A peripherally inserted central catheter (PICC) is a form of IV access that allows medicines and IV fluids to be quickly put into the blood and spread throughout the body. The PICC is a long, thin, flexible tube (catheter) that is put into a vein in a person's arm or leg. The catheter ends in a large vein just outside the heart called the superior vena cava (SVC). After the PICC is put in, a chest X-ray may be done to make sure that it is in the right place. A PICC may be placed for different reasons, such as: To give medicines and liquid nutrition. To give IV fluids and blood products. To take blood samples often. If there is trouble placing a peripheral intravenous (PIV) catheter. If cared for properly, a PICC can remain in place for many months. Having a PICC can allow you to go home from the hospital sooner and continue treatment at home. Medicines and PICC care can be managed at home by a family member, caregiver, or home health care team. What are the risks? Generally, having a PICC is safe. However, problems may occur, including: A blood clot (thrombus) forming in or at the end of the PICC. A blood clot forming in a vein (deep vein thrombosis) or traveling to the lung (pulmonary embolism). Inflammation of the vein (phlebitis) in which the PICC is placed. Infection at the insertion site or in the blood. Blood infections from central lines, like PICCs, can be serious and often require a hospital stay. PICC malposition, or PICC movement or poor placement. A break or cut in the PICC. Do not use scissors near the PICC. Nerve or tendon irritation or injury during PICC insertion. How to care for your PICC Please follow the specific guidelines provided by your health care provider. Preventing infection You and any caregivers should wash your hands often with soap and water for at least 20 seconds. Wash hands: Before touching the PICC or the infusion device. Before changing a  bandage (dressing). Do not change the dressing unless you have been taught to do so and have shown you are able to change it safely. Flush the PICC as told. Tell your health care provider right away if the PICC is hard to flush or does not flush. Do not use force to flush the PICC. Use clean and germ-free (sterile) supplies only. Keep the supplies in a dry place. Do not reuse needles, syringes, or any other supplies. Reusing supplies can lead to infection. Keep the PICC dressing dry and secure it with tape if the edges stop sticking to your skin. Check your PICC insertion site every day for signs of infection. Check for: Redness, swelling, or pain. Fluid or blood. Warmth. Pus or a bad smell. Preventing other problems Do not use a syringe that is less than 10 mL to flush the PICC. Do not have your blood pressure checked on the arm in which the PICC is placed. Do not ever pull or tug on the PICC. Keep it secured to your arm with tape or a stretch wrap when not in use. Do not take the PICC out yourself. Only a trained health care provider should remove the PICC. Keep pets and children away from your PICC. How to care for your PICC dressing Keep your PICC dressing clean and dry to prevent infection. Do not take baths, swim, or use a hot tub until your health care provider approves. Ask your health care provider if you can take  showers. You may only be allowed to take sponge baths. When you are allowed to shower: Ask your health care provider to teach you how to wrap the PICC. Cover the PICC with clear plastic wrap and tape to keep it dry while showering. Follow instructions from your health care provider about how to take care of your insertion site and dressing. Make sure you: Wash your hands with soap and water for at least 20 seconds before and after you change your dressing. If soap and water are not available, use hand sanitizer. Change your dressing only if taught to do so by your health care  provider. Your PICC dressing needs to be changed if it becomes loose or wet. Leave stitches (sutures), skin glue, or adhesive strips in place. These skin closures may need to stay in place for 2 weeks or longer. If adhesive strip edges start to loosen and curl up, you may trim the loose edges. Do not remove adhesive strips completely unless your health care provider tells you to do that. Follow these instructions at home: Disposal of supplies Throw away any syringes in a disposal container that is meant for sharp items (sharps container). You can buy a sharps container from a pharmacy, or you can make one by using an empty, hard plastic bottle with a lid. Place any used dressings or infusion bags into a plastic bag. Throw that bag in the trash. General instructions  Always carry your PICC identification card or wear a medical alert bracelet. Keep the tube clamped at all times, unless it is being used. Always carry a smooth-edge clamp with you to clamp the PICC if it breaks. Do not use scissors or sharp objects near the tube. You may bend your arm and move it freely. If your PICC is near or at the bend of your elbow, avoid activity with repeated motion at the elbow. Avoid lifting heavy objects as told by your health care provider. Keep all follow-up visits. This is important. You will need to have your PICC dressing changed at least once a week. Contact a health care provider if: You have pain in your arm, ear, face, or teeth. You have a fever or chills. You have redness, swelling, or pain around the insertion site. You have fluid or blood coming from the insertion site. Your insertion site feels warm to the touch. You have pus or a bad smell coming from the insertion site. Your skin feels hard and raised around the insertion site. Your PICC dressing has gotten wet or is coming off and you have not been taught how to change it. Get help right away if: You have problems with your PICC, such as  your PICC: Was tugged or pulled and has partially come out. Do not  push the PICC back in. Cannot be flushed, is hard to flush, or leaks around the insertion site when it is flushed. Makes a flushing sound when it is flushed. Appears to have a hole or tear. Is accidentally pulled all the way out. If this happens, cover the insertion site with a gauze dressing. Do not throw the PICC away. Your health care provider will need to check it to be sure the entire catheter came out. You feel your heart racing or skipping beats, or you have chest pain. You have shortness of breath or trouble breathing. You have swelling, redness, warmth, or pain in the arm in which the PICC is placed. You have a red streak going up your arm that  starts under the PICC dressing. These symptoms may be an emergency. Get help right away. Call 911. Do not wait to see if the symptoms will go away. Do not drive yourself to the hospital. Summary A peripherally inserted central catheter (PICC) is a long, thin, flexible tube (catheter) that is put into a vein in the arm or leg. If cared for properly, a PICC can remain in place for many months. Having a PICC can allow you to go home from the hospital sooner and continue treatment at home. The PICC is inserted using a germ-free (sterile) technique by a specially trained health care provider. Only a trained health care provider should remove it. Do not have your blood pressure checked on the arm in which your PICC is placed. Always keep your PICC identification card with you. This information is not intended to replace advice given to you by your health care provider. Make sure you discuss any questions you have with your health care provider. Document Revised: 10/27/2020 Document Reviewed: 10/27/2020 Elsevier Patient Education  2024 [employer].

## 2023-01-23 ENCOUNTER — Other Ambulatory Visit: Payer: Self-pay

## 2023-01-24 ENCOUNTER — Inpatient Hospital Stay: Payer: No Typology Code available for payment source | Attending: Hematology & Oncology

## 2023-01-24 VITALS — BP 117/78 | HR 89 | Temp 98.0°F | Resp 20

## 2023-01-24 DIAGNOSIS — Z7901 Long term (current) use of anticoagulants: Secondary | ICD-10-CM | POA: Diagnosis not present

## 2023-01-24 DIAGNOSIS — C180 Malignant neoplasm of cecum: Secondary | ICD-10-CM | POA: Diagnosis present

## 2023-01-24 DIAGNOSIS — Z86711 Personal history of pulmonary embolism: Secondary | ICD-10-CM | POA: Diagnosis not present

## 2023-01-24 DIAGNOSIS — Z452 Encounter for adjustment and management of vascular access device: Secondary | ICD-10-CM | POA: Insufficient documentation

## 2023-01-24 DIAGNOSIS — C787 Secondary malignant neoplasm of liver and intrahepatic bile duct: Secondary | ICD-10-CM | POA: Insufficient documentation

## 2023-01-24 DIAGNOSIS — Z5111 Encounter for antineoplastic chemotherapy: Secondary | ICD-10-CM | POA: Diagnosis present

## 2023-01-24 DIAGNOSIS — Z5112 Encounter for antineoplastic immunotherapy: Secondary | ICD-10-CM | POA: Diagnosis present

## 2023-01-24 MED ORDER — HEPARIN SOD (PORK) LOCK FLUSH 100 UNIT/ML IV SOLN
500.0000 [IU] | Freq: Once | INTRAVENOUS | Status: AC
  Administered 2023-01-24: 500 [IU] via INTRAVENOUS

## 2023-01-24 MED ORDER — SODIUM CHLORIDE 0.9% FLUSH
10.0000 mL | INTRAVENOUS | Status: DC | PRN
  Administered 2023-01-24: 10 mL via INTRAVENOUS

## 2023-01-24 NOTE — Patient Instructions (Signed)
PICC Home Care Guide A peripherally inserted central catheter (PICC) is a form of IV access that allows medicines and IV fluids to be quickly put into the blood and spread throughout the body. The PICC is a long, thin, flexible tube (catheter) that is put into a vein in a person's arm or leg. The catheter ends in a large vein just outside the heart called the superior vena cava (SVC). After the PICC is put in, a chest X-ray may be done to make sure that it is in the right place. A PICC may be placed for different reasons, such as: To give medicines and liquid nutrition. To give IV fluids and blood products. To take blood samples often. If there is trouble placing a peripheral intravenous (PIV) catheter. If cared for properly, a PICC can remain in place for many months. Having a PICC can allow you to go home from the hospital sooner and continue treatment at home. Medicines and PICC care can be managed at home by a family member, caregiver, or home health care team. What are the risks? Generally, having a PICC is safe. However, problems may occur, including: A blood clot (thrombus) forming in or at the end of the PICC. A blood clot forming in a vein (deep vein thrombosis) or traveling to the lung (pulmonary embolism). Inflammation of the vein (phlebitis) in which the PICC is placed. Infection at the insertion site or in the blood. Blood infections from central lines, like PICCs, can be serious and often require a hospital stay. PICC malposition, or PICC movement or poor placement. A break or cut in the PICC. Do not use scissors near the PICC. Nerve or tendon irritation or injury during PICC insertion. How to care for your PICC Please follow the specific guidelines provided by your health care provider. Preventing infection You and any caregivers should wash your hands often with soap and water for at least 20 seconds. Wash hands: Before touching the PICC or the infusion device. Before changing a  bandage (dressing). Do not change the dressing unless you have been taught to do so and have shown you are able to change it safely. Flush the PICC as told. Tell your health care provider right away if the PICC is hard to flush or does not flush. Do not use force to flush the PICC. Use clean and germ-free (sterile) supplies only. Keep the supplies in a dry place. Do not reuse needles, syringes, or any other supplies. Reusing supplies can lead to infection. Keep the PICC dressing dry and secure it with tape if the edges stop sticking to your skin. Check your PICC insertion site every day for signs of infection. Check for: Redness, swelling, or pain. Fluid or blood. Warmth. Pus or a bad smell. Preventing other problems Do not use a syringe that is less than 10 mL to flush the PICC. Do not have your blood pressure checked on the arm in which the PICC is placed. Do not ever pull or tug on the PICC. Keep it secured to your arm with tape or a stretch wrap when not in use. Do not take the PICC out yourself. Only a trained health care provider should remove the PICC. Keep pets and children away from your PICC. How to care for your PICC dressing Keep your PICC dressing clean and dry to prevent infection. Do not take baths, swim, or use a hot tub until your health care provider approves. Ask your health care provider if you can take  showers. You may only be allowed to take sponge baths. When you are allowed to shower: Ask your health care provider to teach you how to wrap the PICC. Cover the PICC with clear plastic wrap and tape to keep it dry while showering. Follow instructions from your health care provider about how to take care of your insertion site and dressing. Make sure you: Wash your hands with soap and water for at least 20 seconds before and after you change your dressing. If soap and water are not available, use hand sanitizer. Change your dressing only if taught to do so by your health care  provider. Your PICC dressing needs to be changed if it becomes loose or wet. Leave stitches (sutures), skin glue, or adhesive strips in place. These skin closures may need to stay in place for 2 weeks or longer. If adhesive strip edges start to loosen and curl up, you may trim the loose edges. Do not remove adhesive strips completely unless your health care provider tells you to do that. Follow these instructions at home: Disposal of supplies Throw away any syringes in a disposal container that is meant for sharp items (sharps container). You can buy a sharps container from a pharmacy, or you can make one by using an empty, hard plastic bottle with a lid. Place any used dressings or infusion bags into a plastic bag. Throw that bag in the trash. General instructions  Always carry your PICC identification card or wear a medical alert bracelet. Keep the tube clamped at all times, unless it is being used. Always carry a smooth-edge clamp with you to clamp the PICC if it breaks. Do not use scissors or sharp objects near the tube. You may bend your arm and move it freely. If your PICC is near or at the bend of your elbow, avoid activity with repeated motion at the elbow. Avoid lifting heavy objects as told by your health care provider. Keep all follow-up visits. This is important. You will need to have your PICC dressing changed at least once a week. Contact a health care provider if: You have pain in your arm, ear, face, or teeth. You have a fever or chills. You have redness, swelling, or pain around the insertion site. You have fluid or blood coming from the insertion site. Your insertion site feels warm to the touch. You have pus or a bad smell coming from the insertion site. Your skin feels hard and raised around the insertion site. Your PICC dressing has gotten wet or is coming off and you have not been taught how to change it. Get help right away if: You have problems with your PICC, such as  your PICC: Was tugged or pulled and has partially come out. Do not  push the PICC back in. Cannot be flushed, is hard to flush, or leaks around the insertion site when it is flushed. Makes a flushing sound when it is flushed. Appears to have a hole or tear. Is accidentally pulled all the way out. If this happens, cover the insertion site with a gauze dressing. Do not throw the PICC away. Your health care provider will need to check it to be sure the entire catheter came out. You feel your heart racing or skipping beats, or you have chest pain. You have shortness of breath or trouble breathing. You have swelling, redness, warmth, or pain in the arm in which the PICC is placed. You have a red streak going up your arm that  starts under the PICC dressing. These symptoms may be an emergency. Get help right away. Call 911. Do not wait to see if the symptoms will go away. Do not drive yourself to the hospital. Summary A peripherally inserted central catheter (PICC) is a long, thin, flexible tube (catheter) that is put into a vein in the arm or leg. If cared for properly, a PICC can remain in place for many months. Having a PICC can allow you to go home from the hospital sooner and continue treatment at home. The PICC is inserted using a germ-free (sterile) technique by a specially trained health care provider. Only a trained health care provider should remove it. Do not have your blood pressure checked on the arm in which your PICC is placed. Always keep your PICC identification card with you. This information is not intended to replace advice given to you by your health care provider. Make sure you discuss any questions you have with your health care provider. Document Revised: 10/27/2020 Document Reviewed: 10/27/2020 Elsevier Patient Education  2024 [employer].

## 2023-01-26 ENCOUNTER — Inpatient Hospital Stay: Payer: No Typology Code available for payment source

## 2023-01-26 VITALS — BP 122/83 | HR 95 | Temp 97.9°F | Resp 19

## 2023-01-26 DIAGNOSIS — R232 Flushing: Secondary | ICD-10-CM

## 2023-01-26 DIAGNOSIS — C180 Malignant neoplasm of cecum: Secondary | ICD-10-CM

## 2023-01-26 DIAGNOSIS — Z5112 Encounter for antineoplastic immunotherapy: Secondary | ICD-10-CM | POA: Diagnosis not present

## 2023-01-26 MED ORDER — SODIUM CHLORIDE 0.9% FLUSH: 3.0000 mL | Freq: Once | INTRAVENOUS | Status: DC | PRN

## 2023-01-26 MED ORDER — SODIUM CHLORIDE 0.9% FLUSH: 10.0000 mL | Freq: Once | INTRAVENOUS | Status: DC

## 2023-01-26 MED ORDER — HEPARIN SOD (PORK) LOCK FLUSH 100 UNIT/ML IV SOLN
500.0000 [IU] | Freq: Once | INTRAVENOUS | Status: AC | PRN
  Administered 2023-01-26: 250 [IU]

## 2023-01-26 MED ORDER — ALBUTEROL SULFATE (2.5 MG/3ML) 0.083% IN NEBU: 2.5000 mg | INHALATION_SOLUTION | Freq: Once | RESPIRATORY_TRACT | Status: DC | PRN

## 2023-01-26 MED ORDER — SODIUM CHLORIDE 0.9 % IV SOLN: Freq: Once | INTRAVENOUS | Status: DC | PRN

## 2023-01-26 MED ORDER — DIPHENHYDRAMINE HCL 50 MG/ML IJ SOLN: 50.0000 mg | Freq: Once | INTRAMUSCULAR | Status: DC | PRN

## 2023-01-26 MED ORDER — FAMOTIDINE IN NACL 20-0.9 MG/50ML-% IV SOLN: 20.0000 mg | Freq: Once | INTRAVENOUS | Status: DC | PRN

## 2023-01-26 MED ORDER — HEPARIN SOD (PORK) LOCK FLUSH 100 UNIT/ML IV SOLN: 500.0000 [IU] | Freq: Once | INTRAVENOUS | Status: DC

## 2023-01-26 MED ORDER — SODIUM CHLORIDE 0.9% FLUSH
10.0000 mL | Freq: Once | INTRAVENOUS | Status: AC | PRN
  Administered 2023-01-26: 10 mL

## 2023-01-26 MED ORDER — ALTEPLASE 2 MG IJ SOLR: 2.0000 mg | Freq: Once | INTRAMUSCULAR | Status: DC | PRN

## 2023-01-26 MED ORDER — EPINEPHRINE 0.3 MG/0.3ML IJ SOAJ: 0.3000 mg | Freq: Once | INTRAMUSCULAR | Status: DC | PRN

## 2023-01-26 MED ORDER — HEPARIN SOD (PORK) LOCK FLUSH 100 UNIT/ML IV SOLN: 250.0000 [IU] | Freq: Once | INTRAVENOUS | Status: DC | PRN

## 2023-01-26 MED ORDER — METHYLPREDNISOLONE SODIUM SUCC 125 MG IJ SOLR: 125.0000 mg | Freq: Once | INTRAMUSCULAR | Status: DC | PRN

## 2023-01-26 NOTE — Patient Instructions (Signed)
PICC Home Care Guide A peripherally inserted central catheter (PICC) is a form of IV access that allows medicines and IV fluids to be quickly put into the blood and spread throughout the body. The PICC is a long, thin, flexible tube (catheter) that is put into a vein in a person's arm or leg. The catheter ends in a large vein just outside the heart called the superior vena cava (SVC). After the PICC is put in, a chest X-ray may be done to make sure that it is in the right place. A PICC may be placed for different reasons, such as: To give medicines and liquid nutrition. To give IV fluids and blood products. To take blood samples often. If there is trouble placing a peripheral intravenous (PIV) catheter. If cared for properly, a PICC can remain in place for many months. Having a PICC can allow you to go home from the hospital sooner and continue treatment at home. Medicines and PICC care can be managed at home by a family member, caregiver, or home health care team. What are the risks? Generally, having a PICC is safe. However, problems may occur, including: A blood clot (thrombus) forming in or at the end of the PICC. A blood clot forming in a vein (deep vein thrombosis) or traveling to the lung (pulmonary embolism). Inflammation of the vein (phlebitis) in which the PICC is placed. Infection at the insertion site or in the blood. Blood infections from central lines, like PICCs, can be serious and often require a hospital stay. PICC malposition, or PICC movement or poor placement. A break or cut in the PICC. Do not use scissors near the PICC. Nerve or tendon irritation or injury during PICC insertion. How to care for your PICC Please follow the specific guidelines provided by your health care provider. Preventing infection You and any caregivers should wash your hands often with soap and water for at least 20 seconds. Wash hands: Before touching the PICC or the infusion device. Before changing a  bandage (dressing). Do not change the dressing unless you have been taught to do so and have shown you are able to change it safely. Flush the PICC as told. Tell your health care provider right away if the PICC is hard to flush or does not flush. Do not use force to flush the PICC. Use clean and germ-free (sterile) supplies only. Keep the supplies in a dry place. Do not reuse needles, syringes, or any other supplies. Reusing supplies can lead to infection. Keep the PICC dressing dry and secure it with tape if the edges stop sticking to your skin. Check your PICC insertion site every day for signs of infection. Check for: Redness, swelling, or pain. Fluid or blood. Warmth. Pus or a bad smell. Preventing other problems Do not use a syringe that is less than 10 mL to flush the PICC. Do not have your blood pressure checked on the arm in which the PICC is placed. Do not ever pull or tug on the PICC. Keep it secured to your arm with tape or a stretch wrap when not in use. Do not take the PICC out yourself. Only a trained health care provider should remove the PICC. Keep pets and children away from your PICC. How to care for your PICC dressing Keep your PICC dressing clean and dry to prevent infection. Do not take baths, swim, or use a hot tub until your health care provider approves. Ask your health care provider if you can take  showers. You may only be allowed to take sponge baths. When you are allowed to shower: Ask your health care provider to teach you how to wrap the PICC. Cover the PICC with clear plastic wrap and tape to keep it dry while showering. Follow instructions from your health care provider about how to take care of your insertion site and dressing. Make sure you: Wash your hands with soap and water for at least 20 seconds before and after you change your dressing. If soap and water are not available, use hand sanitizer. Change your dressing only if taught to do so by your health care  provider. Your PICC dressing needs to be changed if it becomes loose or wet. Leave stitches (sutures), skin glue, or adhesive strips in place. These skin closures may need to stay in place for 2 weeks or longer. If adhesive strip edges start to loosen and curl up, you may trim the loose edges. Do not remove adhesive strips completely unless your health care provider tells you to do that. Follow these instructions at home: Disposal of supplies Throw away any syringes in a disposal container that is meant for sharp items (sharps container). You can buy a sharps container from a pharmacy, or you can make one by using an empty, hard plastic bottle with a lid. Place any used dressings or infusion bags into a plastic bag. Throw that bag in the trash. General instructions  Always carry your PICC identification card or wear a medical alert bracelet. Keep the tube clamped at all times, unless it is being used. Always carry a smooth-edge clamp with you to clamp the PICC if it breaks. Do not use scissors or sharp objects near the tube. You may bend your arm and move it freely. If your PICC is near or at the bend of your elbow, avoid activity with repeated motion at the elbow. Avoid lifting heavy objects as told by your health care provider. Keep all follow-up visits. This is important. You will need to have your PICC dressing changed at least once a week. Contact a health care provider if: You have pain in your arm, ear, face, or teeth. You have a fever or chills. You have redness, swelling, or pain around the insertion site. You have fluid or blood coming from the insertion site. Your insertion site feels warm to the touch. You have pus or a bad smell coming from the insertion site. Your skin feels hard and raised around the insertion site. Your PICC dressing has gotten wet or is coming off and you have not been taught how to change it. Get help right away if: You have problems with your PICC, such as  your PICC: Was tugged or pulled and has partially come out. Do not  push the PICC back in. Cannot be flushed, is hard to flush, or leaks around the insertion site when it is flushed. Makes a flushing sound when it is flushed. Appears to have a hole or tear. Is accidentally pulled all the way out. If this happens, cover the insertion site with a gauze dressing. Do not throw the PICC away. Your health care provider will need to check it to be sure the entire catheter came out. You feel your heart racing or skipping beats, or you have chest pain. You have shortness of breath or trouble breathing. You have swelling, redness, warmth, or pain in the arm in which the PICC is placed. You have a red streak going up your arm that  starts under the PICC dressing. These symptoms may be an emergency. Get help right away. Call 911. Do not wait to see if the symptoms will go away. Do not drive yourself to the hospital. Summary A peripherally inserted central catheter (PICC) is a long, thin, flexible tube (catheter) that is put into a vein in the arm or leg. If cared for properly, a PICC can remain in place for many months. Having a PICC can allow you to go home from the hospital sooner and continue treatment at home. The PICC is inserted using a germ-free (sterile) technique by a specially trained health care provider. Only a trained health care provider should remove it. Do not have your blood pressure checked on the arm in which your PICC is placed. Always keep your PICC identification card with you. This information is not intended to replace advice given to you by your health care provider. Make sure you discuss any questions you have with your health care provider. Document Revised: 10/27/2020 Document Reviewed: 10/27/2020 Elsevier Patient Education  2024 [employer].

## 2023-01-26 NOTE — Progress Notes (Signed)
Pt. arrived for PICC flush. Attempted to obtained blood return by Elveria Rising and Cindie Crumbly. Pt. repositioned/deep breathing without blood return. No resistance noted with flushing. Pt. Denied discomfort to site. Dressing re- enforced. Pt. will return on Monday and will reassess. HL

## 2023-01-29 ENCOUNTER — Encounter: Payer: Self-pay | Admitting: Medical Oncology

## 2023-01-29 ENCOUNTER — Other Ambulatory Visit: Payer: Self-pay

## 2023-01-29 ENCOUNTER — Inpatient Hospital Stay: Payer: No Typology Code available for payment source

## 2023-01-29 ENCOUNTER — Inpatient Hospital Stay (HOSPITAL_BASED_OUTPATIENT_CLINIC_OR_DEPARTMENT_OTHER): Payer: No Typology Code available for payment source | Admitting: Medical Oncology

## 2023-01-29 ENCOUNTER — Encounter: Payer: Self-pay | Admitting: Hematology & Oncology

## 2023-01-29 VITALS — BP 122/79

## 2023-01-29 VITALS — BP 115/69 | HR 66 | Temp 98.0°F | Resp 19 | Ht 64.0 in | Wt 262.4 lb

## 2023-01-29 DIAGNOSIS — Z452 Encounter for adjustment and management of vascular access device: Secondary | ICD-10-CM

## 2023-01-29 DIAGNOSIS — C180 Malignant neoplasm of cecum: Secondary | ICD-10-CM

## 2023-01-29 DIAGNOSIS — Z5112 Encounter for antineoplastic immunotherapy: Secondary | ICD-10-CM | POA: Diagnosis not present

## 2023-01-29 DIAGNOSIS — D509 Iron deficiency anemia, unspecified: Secondary | ICD-10-CM | POA: Diagnosis not present

## 2023-01-29 DIAGNOSIS — T451X5A Adverse effect of antineoplastic and immunosuppressive drugs, initial encounter: Secondary | ICD-10-CM | POA: Diagnosis not present

## 2023-01-29 DIAGNOSIS — D701 Agranulocytosis secondary to cancer chemotherapy: Secondary | ICD-10-CM | POA: Diagnosis not present

## 2023-01-29 LAB — CMP (CANCER CENTER ONLY)
ALT: 10 U/L (ref 0–44)
AST: 16 U/L (ref 15–41)
Albumin: 3.3 g/dL — ABNORMAL LOW (ref 3.5–5.0)
Alkaline Phosphatase: 95 U/L (ref 38–126)
Anion gap: 7 (ref 5–15)
BUN: 12 mg/dL (ref 6–20)
CO2: 29 mmol/L (ref 22–32)
Calcium: 8.8 mg/dL — ABNORMAL LOW (ref 8.9–10.3)
Chloride: 105 mmol/L (ref 98–111)
Creatinine: 0.7 mg/dL (ref 0.44–1.00)
GFR, Estimated: >60 mL/min (ref >60)
Glucose, Bld: 105 mg/dL — ABNORMAL HIGH (ref 70–99)
Potassium: 3.8 mmol/L (ref 3.5–5.1)
Sodium: 141 mmol/L (ref 135–145)
Total Bilirubin: 0.7 mg/dL (ref 0.3–1.2)
Total Protein: 6.1 g/dL — ABNORMAL LOW (ref 6.5–8.1)

## 2023-01-29 LAB — CBC WITH DIFFERENTIAL (CANCER CENTER ONLY)
Abs Immature Granulocytes: 0 10*3/uL (ref 0.00–0.07)
Basophils Absolute: 0 10*3/uL (ref 0.0–0.1)
Basophils Relative: 1 %
Eosinophils Absolute: 0 10*3/uL (ref 0.0–0.5)
Eosinophils Relative: 1 %
HCT: 36.4 % (ref 36.0–46.0)
Hemoglobin: 11.7 g/dL — ABNORMAL LOW (ref 12.0–15.0)
Immature Granulocytes: 0 %
Lymphocytes Relative: 43 %
Lymphs Abs: 1.1 10*3/uL (ref 0.7–4.0)
MCH: 28.4 pg (ref 26.0–34.0)
MCHC: 32.1 g/dL (ref 30.0–36.0)
MCV: 88.3 fL (ref 80.0–100.0)
Monocytes Absolute: 0.5 10*3/uL (ref 0.1–1.0)
Monocytes Relative: 20 %
Neutro Abs: 0.9 10*3/uL — ABNORMAL LOW (ref 1.7–7.7)
Neutrophils Relative %: 35 %
Platelet Count: 187 10*3/uL (ref 150–400)
RBC: 4.12 MIL/uL (ref 3.87–5.11)
RDW: 16.5 % — ABNORMAL HIGH (ref 11.5–15.5)
Smear Review: NORMAL
WBC Count: 2.5 10*3/uL — ABNORMAL LOW (ref 4.0–10.5)
nRBC: 0 % (ref 0.0–0.2)

## 2023-01-29 LAB — CEA (ACCESS): CEA (CHCC): 452.6 ng/mL — ABNORMAL HIGH (ref 0.00–5.00)

## 2023-01-29 MED ORDER — ALTEPLASE 2 MG IJ SOLR: 2.0000 mg | Freq: Once | INTRAMUSCULAR | Status: DC

## 2023-01-29 MED ORDER — SODIUM CHLORIDE 0.9 % IV SOLN
5.0000 mg/kg | Freq: Once | INTRAVENOUS | Status: AC
  Administered 2023-01-29: 600 mg via INTRAVENOUS
  Filled 2023-01-29: qty 16

## 2023-01-29 MED ORDER — OXALIPLATIN CHEMO INJECTION 100 MG/20ML
85.0000 mg/m2 | Freq: Once | INTRAVENOUS | Status: AC
  Administered 2023-01-29: 200 mg via INTRAVENOUS
  Filled 2023-01-29: qty 40

## 2023-01-29 MED ORDER — SODIUM CHLORIDE 0.9 % IV SOLN
2400.0000 mg/m2 | INTRAVENOUS | Status: DC
  Administered 2023-01-29: 5550 mg via INTRAVENOUS
  Filled 2023-01-29: qty 111

## 2023-01-29 MED ORDER — ALTEPLASE 2 MG IJ SOLR
2.0000 mg | Freq: Once | INTRAMUSCULAR | Status: AC
  Administered 2023-01-29: 2 mg

## 2023-01-29 MED ORDER — FLUOROURACIL CHEMO INJECTION 2.5 GM/50ML
400.0000 mg/m2 | Freq: Once | INTRAVENOUS | Status: AC
  Administered 2023-01-29: 1000 mg via INTRAVENOUS
  Filled 2023-01-29: qty 20

## 2023-01-29 MED ORDER — DEXTROSE 5 % IV SOLN: Freq: Once | INTRAVENOUS | Status: AC

## 2023-01-29 MED ORDER — LEUCOVORIN CALCIUM INJECTION 350 MG
400.0000 mg/m2 | Freq: Once | INTRAVENOUS | Status: AC
  Administered 2023-01-29: 928 mg via INTRAVENOUS
  Filled 2023-01-29: qty 46.4

## 2023-01-29 MED ORDER — DEXTROSE 5 % IV SOLN: Freq: Once | INTRAVENOUS | Status: DC

## 2023-01-29 MED ORDER — PALONOSETRON HCL INJECTION 0.25 MG/5ML
0.2500 mg | Freq: Once | INTRAVENOUS | Status: AC
  Administered 2023-01-29: 0.25 mg via INTRAVENOUS
  Filled 2023-01-29: qty 5

## 2023-01-29 MED ORDER — ALTEPLASE 2 MG IJ SOLR
2.0000 mg | Freq: Once | INTRAMUSCULAR | Status: DC
  Filled 2023-01-29: qty 2

## 2023-01-29 MED ORDER — SODIUM CHLORIDE 0.9 % IV SOLN
10.0000 mg | Freq: Once | INTRAVENOUS | Status: AC
  Administered 2023-01-29: 10 mg via INTRAVENOUS
  Filled 2023-01-29: qty 10

## 2023-01-29 MED ORDER — SODIUM CHLORIDE 0.9 % IV SOLN: Freq: Once | INTRAVENOUS | Status: AC

## 2023-01-29 NOTE — Progress Notes (Signed)
Unable to obtain blood return from PICC. Alteplase started at 09:23 and positive blood return obtained at 10:00am, 10ml of blood obtained at ease and wasted. ANC 0.9, verified with Clent Jacks, per Dr. Myna Hidalgo, he wants to proceed forward with treatment today and will add on udenyca . Pt. denied any symptoms.Neutropenic precautions reviewed. HL

## 2023-01-29 NOTE — Progress Notes (Signed)
Patient PICC flushes easily with normal saline. However, no blood return noted. Alteplase instilled. Positive blood return noted.

## 2023-01-29 NOTE — Progress Notes (Signed)
Hematology and Oncology Follow Up Visit  Lauren Mcpherson [id number] 03/06/1966 57 y.o. 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   She is doing well and has no complaints at this time.  She states that she has been tolerating treatment other than with stable neuropathy of feet and hands  No fever, chills, n/v, cough, rash, dizziness, SOB, chest pain, palpitations, abdominal pain or changes in bowel or bladder habits.  No swelling in Lauren Mcpherson extremities.  No falls or syncope reported.  Appetite and hydration are good.  Wt Readings from Last 3 Encounters:  01/29/23 262 lb 6.4 oz (119 kg)  01/12/23 258 lb (117 kg)  01/02/23 258 lb (117 kg)   ECOG Performance Status: 1 - Symptomatic but completely ambulatory  Medications:  Allergies as of 01/29/2023   No Known Allergies      Medication List        Accurate as of January 29, 2023 10:36 AM. If you have any questions, ask your nurse or doctor.          dexamethasone 4 MG tablet Commonly known as: DECADRON Take 2 tablets (8 mg total) by mouth daily. Start the day after chemotherapy for 2 days. Take with food.   ondansetron 8 MG tablet Commonly known as: Zofran Take 1 tablet (8 mg total) by mouth every 8 (eight) hours as needed for nausea or vomiting. Start on the third day after chemotherapy.   OVER THE  COUNTER MEDICATION daily. Nervive - one capsule daily.   prochlorperazine 10 MG tablet Commonly known as: COMPAZINE TAKE 1 TABLET(10 MG) BY MOUTH EVERY 6 HOURS AS NEEDED FOR NAUSEA OR VOMITING   rivaroxaban 10 MG Tabs tablet Commonly known as: XARELTO Take 1 tablet (10 mg total) by mouth daily with supper.        Allergies: No Known Allergies  Past Medical History, Surgical history, Social history, and Family History were reviewed and updated.  Review of Systems: All other 10 point review of systems is negative.   Physical Exam:  height is 5\' 4"  (1.626 m) and weight is 262 lb 6.4 oz (119 kg). Lauren Mcpherson oral temperature is 98 F (36.7 C). Lauren Mcpherson blood pressure is 115/69 and Lauren Mcpherson pulse is 66. Lauren Mcpherson respiration is 19 and oxygen saturation is 100%.   Wt Readings from Last 3 Encounters:  01/29/23 262 lb 6.4 oz (119 kg)  01/12/23 258 lb (117 kg)  01/02/23 258 lb (117 kg)    Ocular: Sclerae unicteric, pupils equal, round and reactive to light Ear-nose-throat: Oropharynx clear, dentition fair Lymphatic: No cervical or supraclavicular adenopathy Lungs no rales or rhonchi, good excursion bilaterally Heart regular rate and rhythm, no murmur appreciated Abd soft, nontender, positive bowel sounds MSK no focal spinal tenderness, no joint edema Neuro: non-focal, well-oriented, appropriate affect   Lab Results  Component Value Date  WBC 2.5 (L) 01/29/2023   HGB 11.7 (L) 01/29/2023   HCT 36.4 01/29/2023   MCV 88.3 01/29/2023   PLT 187 01/29/2023   Lab Results  Component Value Date   FERRITIN 312 (H) 12/28/2021   IRON 57 12/28/2021   TIBC 230 (L) 12/28/2021   UIBC 173 12/28/2021   IRONPCTSAT 25 12/28/2021   Lab Results  Component Value Date   RETICCTPCT 1.5 12/14/2021   RBC 4.12 01/29/2023   No results found for: "KPAFRELGTCHN", "LAMBDASER", "KAPLAMBRATIO" No results found for: "IGGSERUM", "IGA", "IGMSERUM" No results found for: "TOTALPROTELP", "ALBUMINELP", "A1GS", "A2GS",  "BETS", "BETA2SER", "GAMS", "MSPIKE", "SPEI"   Chemistry      Component Value Date/Time   NA 141 01/29/2023 0933   K 3.8 01/29/2023 0933   CL 105 01/29/2023 0933   CO2 29 01/29/2023 0933   BUN 12 01/29/2023 0933   CREATININE 0.70 01/29/2023 0933      Component Value Date/Time   CALCIUM 8.8 (L) 01/29/2023 0933   ALKPHOS 95 01/29/2023 0933   AST 16 01/29/2023 0933   ALT 10 01/29/2023 0933   BILITOT 0.7 01/29/2023 0933       Impression and Plan: Lauren Mcpherson is a very pleasant 57 yo African American female with metastatic 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   She is here for consideration of cycle 3 of FOLFOX/Avastin.  Labs reviewed- slightly neutropenic. Reviewed with Dr. Myna Hidalgo- will proceed forward with treatment today and add on Udencya for WBC support. Discussed precautions.  Plan is for imaging following cycle 4 CEA pending.    Continue PICC flush 3 days a week, MWF.  RTC 2 weeks MD, PICC labs, Cycle 4 FOLFOX/Avastin-[place]  Rushie Chestnut, PA-C 10/7/202410:36 AM

## 2023-01-29 NOTE — Patient Instructions (Addendum)
[place] [facility]  Discharge Instructions: Thank you for choosing [facility] to provide your oncology and hematology care.   If you have a lab appointment with the Cancer Center, please go directly to the Cancer Center and check in at the registration area.  Wear comfortable clothing and clothing appropriate for easy access to any Portacath or PICC line.   We strive to give you quality time with your provider. You may need to reschedule your appointment if you arrive late (15 or more minutes).  Arriving late affects you and other patients whose appointments are after yours.  Also, if you miss three or more appointments without notifying the office, you may be dismissed from the clinic at the provider's discretion.      For prescription refill requests, have your pharmacy contact our office and allow 72 hours for refills to be completed.    Today you received the following chemotherapy and/or immunotherapy agents avastin, oxaliplatin, leucovorin, 94fu plus neutropenia precaution.   To help prevent nausea and vomiting after your treatment, we encourage you to take your nausea medication as directed.  BELOW ARE SYMPTOMS THAT SHOULD BE REPORTED IMMEDIATELY: *FEVER GREATER THAN 100.4 F (38 C) OR HIGHER *CHILLS OR SWEATING *NAUSEA AND VOMITING THAT IS NOT CONTROLLED WITH YOUR NAUSEA MEDICATION *UNUSUAL SHORTNESS OF BREATH *UNUSUAL BRUISING OR BLEEDING *URINARY PROBLEMS (pain or burning when urinating, or frequent urination) *BOWEL PROBLEMS (unusual diarrhea, constipation, pain near the anus) TENDERNESS IN MOUTH AND THROAT WITH OR WITHOUT PRESENCE OF ULCERS (sore throat, sores in mouth, or a toothache) UNUSUAL RASH, SWELLING OR PAIN  UNUSUAL VAGINAL DISCHARGE OR ITCHING   Items with * indicate a potential emergency and should be followed up as soon as possible or go to the Emergency Department if any problems should occur.  Please show the  CHEMOTHERAPY ALERT CARD or IMMUNOTHERAPY ALERT CARD at check-in to the Emergency Department and triage nurse. Should you have questions after your visit or need to cancel or reschedule your appointment, please contact [place] [facility] [place]  [phone] and follow the prompts.  Office hours are 8:00 a.m. to 4:30 p.m. Monday - Friday. Please note that voicemails left after 4:00 p.m. may not be returned until the following business day.  We are closed weekends and major holidays. You have access to a nurse at all times for urgent questions. Please call the main number to the clinic [phone] and follow the prompts.  For any non-urgent questions, you may also contact your provider using MyChart. We now offer e-Visits for anyone 83 and older to request care online for non-urgent symptoms. For details visit [email or web address].   Also download the MyChart app! Go to the app store, search "MyChart", open the app, select [place], and log in with your MyChart username and password.

## 2023-01-29 NOTE — Patient Instructions (Signed)
PICC Home Care Guide A peripherally inserted central catheter (PICC) is a form of IV access that allows medicines and IV fluids to be quickly put into the blood and spread throughout the body. The PICC is a long, thin, flexible tube (catheter) that is put into a vein in a person's arm or leg. The catheter ends in a large vein just outside the heart called the superior vena cava (SVC). After the PICC is put in, a chest X-ray may be done to make sure that it is in the right place. A PICC may be placed for different reasons, such as: To give medicines and liquid nutrition. To give IV fluids and blood products. To take blood samples often. If there is trouble placing a peripheral intravenous (PIV) catheter. If cared for properly, a PICC can remain in place for many months. Having a PICC can allow you to go home from the hospital sooner and continue treatment at home. Medicines and PICC care can be managed at home by a family member, caregiver, or home health care team. What are the risks? Generally, having a PICC is safe. However, problems may occur, including: A blood clot (thrombus) forming in or at the end of the PICC. A blood clot forming in a vein (deep vein thrombosis) or traveling to the lung (pulmonary embolism). Inflammation of the vein (phlebitis) in which the PICC is placed. Infection at the insertion site or in the blood. Blood infections from central lines, like PICCs, can be serious and often require a hospital stay. PICC malposition, or PICC movement or poor placement. A break or cut in the PICC. Do not use scissors near the PICC. Nerve or tendon irritation or injury during PICC insertion. How to care for your PICC Please follow the specific guidelines provided by your health care provider. Preventing infection You and any caregivers should wash your hands often with soap and water for at least 20 seconds. Wash hands: Before touching the PICC or the infusion device. Before changing a  bandage (dressing). Do not change the dressing unless you have been taught to do so and have shown you are able to change it safely. Flush the PICC as told. Tell your health care provider right away if the PICC is hard to flush or does not flush. Do not use force to flush the PICC. Use clean and germ-free (sterile) supplies only. Keep the supplies in a dry place. Do not reuse needles, syringes, or any other supplies. Reusing supplies can lead to infection. Keep the PICC dressing dry and secure it with tape if the edges stop sticking to your skin. Check your PICC insertion site every day for signs of infection. Check for: Redness, swelling, or pain. Fluid or blood. Warmth. Pus or a bad smell. Preventing other problems Do not use a syringe that is less than 10 mL to flush the PICC. Do not have your blood pressure checked on the arm in which the PICC is placed. Do not ever pull or tug on the PICC. Keep it secured to your arm with tape or a stretch wrap when not in use. Do not take the PICC out yourself. Only a trained health care provider should remove the PICC. Keep pets and children away from your PICC. How to care for your PICC dressing Keep your PICC dressing clean and dry to prevent infection. Do not take baths, swim, or use a hot tub until your health care provider approves. Ask your health care provider if you can take  showers. You may only be allowed to take sponge baths. When you are allowed to shower: Ask your health care provider to teach you how to wrap the PICC. Cover the PICC with clear plastic wrap and tape to keep it dry while showering. Follow instructions from your health care provider about how to take care of your insertion site and dressing. Make sure you: Wash your hands with soap and water for at least 20 seconds before and after you change your dressing. If soap and water are not available, use hand sanitizer. Change your dressing only if taught to do so by your health care  provider. Your PICC dressing needs to be changed if it becomes loose or wet. Leave stitches (sutures), skin glue, or adhesive strips in place. These skin closures may need to stay in place for 2 weeks or longer. If adhesive strip edges start to loosen and curl up, you may trim the loose edges. Do not remove adhesive strips completely unless your health care provider tells you to do that. Follow these instructions at home: Disposal of supplies Throw away any syringes in a disposal container that is meant for sharp items (sharps container). You can buy a sharps container from a pharmacy, or you can make one by using an empty, hard plastic bottle with a lid. Place any used dressings or infusion bags into a plastic bag. Throw that bag in the trash. General instructions  Always carry your PICC identification card or wear a medical alert bracelet. Keep the tube clamped at all times, unless it is being used. Always carry a smooth-edge clamp with you to clamp the PICC if it breaks. Do not use scissors or sharp objects near the tube. You may bend your arm and move it freely. If your PICC is near or at the bend of your elbow, avoid activity with repeated motion at the elbow. Avoid lifting heavy objects as told by your health care provider. Keep all follow-up visits. This is important. You will need to have your PICC dressing changed at least once a week. Contact a health care provider if: You have pain in your arm, ear, face, or teeth. You have a fever or chills. You have redness, swelling, or pain around the insertion site. You have fluid or blood coming from the insertion site. Your insertion site feels warm to the touch. You have pus or a bad smell coming from the insertion site. Your skin feels hard and raised around the insertion site. Your PICC dressing has gotten wet or is coming off and you have not been taught how to change it. Get help right away if: You have problems with your PICC, such as  your PICC: Was tugged or pulled and has partially come out. Do not  push the PICC back in. Cannot be flushed, is hard to flush, or leaks around the insertion site when it is flushed. Makes a flushing sound when it is flushed. Appears to have a hole or tear. Is accidentally pulled all the way out. If this happens, cover the insertion site with a gauze dressing. Do not throw the PICC away. Your health care provider will need to check it to be sure the entire catheter came out. You feel your heart racing or skipping beats, or you have chest pain. You have shortness of breath or trouble breathing. You have swelling, redness, warmth, or pain in the arm in which the PICC is placed. You have a red streak going up your arm that  starts under the PICC dressing. These symptoms may be an emergency. Get help right away. Call 911. Do not wait to see if the symptoms will go away. Do not drive yourself to the hospital. Summary A peripherally inserted central catheter (PICC) is a long, thin, flexible tube (catheter) that is put into a vein in the arm or leg. If cared for properly, a PICC can remain in place for many months. Having a PICC can allow you to go home from the hospital sooner and continue treatment at home. The PICC is inserted using a germ-free (sterile) technique by a specially trained health care provider. Only a trained health care provider should remove it. Do not have your blood pressure checked on the arm in which your PICC is placed. Always keep your PICC identification card with you. This information is not intended to replace advice given to you by your health care provider. Make sure you discuss any questions you have with your health care provider. Document Revised: 10/27/2020 Document Reviewed: 10/27/2020 Elsevier Patient Education  2024 [employer].

## 2023-01-30 ENCOUNTER — Other Ambulatory Visit: Payer: Self-pay

## 2023-01-30 ENCOUNTER — Other Ambulatory Visit: Payer: Self-pay | Admitting: Medical Oncology

## 2023-01-31 ENCOUNTER — Inpatient Hospital Stay: Payer: No Typology Code available for payment source

## 2023-01-31 ENCOUNTER — Inpatient Hospital Stay: Payer: No Typology Code available for payment source | Admitting: Licensed Clinical Social Worker

## 2023-01-31 VITALS — BP 104/82 | HR 98 | Temp 97.8°F | Resp 20

## 2023-01-31 DIAGNOSIS — Z5112 Encounter for antineoplastic immunotherapy: Secondary | ICD-10-CM | POA: Diagnosis not present

## 2023-01-31 DIAGNOSIS — C180 Malignant neoplasm of cecum: Secondary | ICD-10-CM

## 2023-01-31 MED ORDER — HEPARIN SOD (PORK) LOCK FLUSH 100 UNIT/ML IV SOLN
500.0000 [IU] | Freq: Once | INTRAVENOUS | Status: AC | PRN
  Administered 2023-01-31: 500 [IU]

## 2023-01-31 MED ORDER — PEGFILGRASTIM-CBQV 6 MG/0.6ML ~~LOC~~ SOSY
6.0000 mg | PREFILLED_SYRINGE | Freq: Once | SUBCUTANEOUS | Status: AC
  Administered 2023-01-31: 6 mg via SUBCUTANEOUS
  Filled 2023-01-31: qty 0.6

## 2023-01-31 MED ORDER — SODIUM CHLORIDE 0.9% FLUSH
10.0000 mL | INTRAVENOUS | Status: DC | PRN
  Administered 2023-01-31: 10 mL

## 2023-01-31 NOTE — Patient Instructions (Signed)

## 2023-01-31 NOTE — Progress Notes (Signed)
[facility] CSW Progress Note  Visual merchandiser met with patient to assess needs per her request.  Patient requested assistance with gas since she has to go to the [employer] three times per week.  Informed her of the new process regarding assistance and she said she understood.  Patient also has family and friends she can depend on for help.    Dorothey Baseman, LCSW Clinical Social Worker [facility]

## 2023-02-02 ENCOUNTER — Inpatient Hospital Stay: Payer: No Typology Code available for payment source

## 2023-02-02 VITALS — BP 112/79 | HR 88 | Temp 98.7°F | Resp 18

## 2023-02-02 DIAGNOSIS — Z5112 Encounter for antineoplastic immunotherapy: Secondary | ICD-10-CM | POA: Diagnosis not present

## 2023-02-02 DIAGNOSIS — C180 Malignant neoplasm of cecum: Secondary | ICD-10-CM

## 2023-02-02 MED ORDER — HEPARIN SOD (PORK) LOCK FLUSH 100 UNIT/ML IV SOLN
500.0000 [IU] | Freq: Once | INTRAVENOUS | Status: AC
  Administered 2023-02-02: 500 [IU] via INTRAVENOUS

## 2023-02-02 MED ORDER — SODIUM CHLORIDE 0.9% FLUSH
10.0000 mL | INTRAVENOUS | Status: DC | PRN
  Administered 2023-02-02: 10 mL via INTRAVENOUS

## 2023-02-02 NOTE — Patient Instructions (Signed)
PICC Home Care Guide A peripherally inserted central catheter (PICC) is a form of IV access that allows medicines and IV fluids to be quickly put into the blood and spread throughout the body. The PICC is a long, thin, flexible tube (catheter) that is put into a vein in a person's arm or leg. The catheter ends in a large vein just outside the heart called the superior vena cava (SVC). After the PICC is put in, a chest X-ray may be done to make sure that it is in the right place. A PICC may be placed for different reasons, such as: To give medicines and liquid nutrition. To give IV fluids and blood products. To take blood samples often. If there is trouble placing a peripheral intravenous (PIV) catheter. If cared for properly, a PICC can remain in place for many months. Having a PICC can allow you to go home from the hospital sooner and continue treatment at home. Medicines and PICC care can be managed at home by a family member, caregiver, or home health care team. What are the risks? Generally, having a PICC is safe. However, problems may occur, including: A blood clot (thrombus) forming in or at the end of the PICC. A blood clot forming in a vein (deep vein thrombosis) or traveling to the lung (pulmonary embolism). Inflammation of the vein (phlebitis) in which the PICC is placed. Infection at the insertion site or in the blood. Blood infections from central lines, like PICCs, can be serious and often require a hospital stay. PICC malposition, or PICC movement or poor placement. A break or cut in the PICC. Do not use scissors near the PICC. Nerve or tendon irritation or injury during PICC insertion. How to care for your PICC Please follow the specific guidelines provided by your health care provider. Preventing infection You and any caregivers should wash your hands often with soap and water for at least 20 seconds. Wash hands: Before touching the PICC or the infusion device. Before changing a  bandage (dressing). Do not change the dressing unless you have been taught to do so and have shown you are able to change it safely. Flush the PICC as told. Tell your health care provider right away if the PICC is hard to flush or does not flush. Do not use force to flush the PICC. Use clean and germ-free (sterile) supplies only. Keep the supplies in a dry place. Do not reuse needles, syringes, or any other supplies. Reusing supplies can lead to infection. Keep the PICC dressing dry and secure it with tape if the edges stop sticking to your skin. Check your PICC insertion site every day for signs of infection. Check for: Redness, swelling, or pain. Fluid or blood. Warmth. Pus or a bad smell. Preventing other problems Do not use a syringe that is less than 10 mL to flush the PICC. Do not have your blood pressure checked on the arm in which the PICC is placed. Do not ever pull or tug on the PICC. Keep it secured to your arm with tape or a stretch wrap when not in use. Do not take the PICC out yourself. Only a trained health care provider should remove the PICC. Keep pets and children away from your PICC. How to care for your PICC dressing Keep your PICC dressing clean and dry to prevent infection. Do not take baths, swim, or use a hot tub until your health care provider approves. Ask your health care provider if you can take  showers. You may only be allowed to take sponge baths. When you are allowed to shower: Ask your health care provider to teach you how to wrap the PICC. Cover the PICC with clear plastic wrap and tape to keep it dry while showering. Follow instructions from your health care provider about how to take care of your insertion site and dressing. Make sure you: Wash your hands with soap and water for at least 20 seconds before and after you change your dressing. If soap and water are not available, use hand sanitizer. Change your dressing only if taught to do so by your health care  provider. Your PICC dressing needs to be changed if it becomes loose or wet. Leave stitches (sutures), skin glue, or adhesive strips in place. These skin closures may need to stay in place for 2 weeks or longer. If adhesive strip edges start to loosen and curl up, you may trim the loose edges. Do not remove adhesive strips completely unless your health care provider tells you to do that. Follow these instructions at home: Disposal of supplies Throw away any syringes in a disposal container that is meant for sharp items (sharps container). You can buy a sharps container from a pharmacy, or you can make one by using an empty, hard plastic bottle with a lid. Place any used dressings or infusion bags into a plastic bag. Throw that bag in the trash. General instructions  Always carry your PICC identification card or wear a medical alert bracelet. Keep the tube clamped at all times, unless it is being used. Always carry a smooth-edge clamp with you to clamp the PICC if it breaks. Do not use scissors or sharp objects near the tube. You may bend your arm and move it freely. If your PICC is near or at the bend of your elbow, avoid activity with repeated motion at the elbow. Avoid lifting heavy objects as told by your health care provider. Keep all follow-up visits. This is important. You will need to have your PICC dressing changed at least once a week. Contact a health care provider if: You have pain in your arm, ear, face, or teeth. You have a fever or chills. You have redness, swelling, or pain around the insertion site. You have fluid or blood coming from the insertion site. Your insertion site feels warm to the touch. You have pus or a bad smell coming from the insertion site. Your skin feels hard and raised around the insertion site. Your PICC dressing has gotten wet or is coming off and you have not been taught how to change it. Get help right away if: You have problems with your PICC, such as  your PICC: Was tugged or pulled and has partially come out. Do not  push the PICC back in. Cannot be flushed, is hard to flush, or leaks around the insertion site when it is flushed. Makes a flushing sound when it is flushed. Appears to have a hole or tear. Is accidentally pulled all the way out. If this happens, cover the insertion site with a gauze dressing. Do not throw the PICC away. Your health care provider will need to check it to be sure the entire catheter came out. You feel your heart racing or skipping beats, or you have chest pain. You have shortness of breath or trouble breathing. You have swelling, redness, warmth, or pain in the arm in which the PICC is placed. You have a red streak going up your arm that  starts under the PICC dressing. These symptoms may be an emergency. Get help right away. Call 911. Do not wait to see if the symptoms will go away. Do not drive yourself to the hospital. Summary A peripherally inserted central catheter (PICC) is a long, thin, flexible tube (catheter) that is put into a vein in the arm or leg. If cared for properly, a PICC can remain in place for many months. Having a PICC can allow you to go home from the hospital sooner and continue treatment at home. The PICC is inserted using a germ-free (sterile) technique by a specially trained health care provider. Only a trained health care provider should remove it. Do not have your blood pressure checked on the arm in which your PICC is placed. Always keep your PICC identification card with you. This information is not intended to replace advice given to you by your health care provider. Make sure you discuss any questions you have with your health care provider. Document Revised: 10/27/2020 Document Reviewed: 10/27/2020 Elsevier Patient Education  2024 [employer].

## 2023-02-05 ENCOUNTER — Inpatient Hospital Stay: Payer: No Typology Code available for payment source

## 2023-02-05 ENCOUNTER — Other Ambulatory Visit: Payer: Self-pay | Admitting: Hematology & Oncology

## 2023-02-05 VITALS — BP 123/84 | HR 100 | Temp 98.3°F | Resp 20

## 2023-02-05 DIAGNOSIS — C180 Malignant neoplasm of cecum: Secondary | ICD-10-CM

## 2023-02-05 DIAGNOSIS — Z5112 Encounter for antineoplastic immunotherapy: Secondary | ICD-10-CM | POA: Diagnosis not present

## 2023-02-05 NOTE — Patient Instructions (Signed)
PICC Home Care Guide A peripherally inserted central catheter (PICC) is a form of IV access that allows medicines and IV fluids to be quickly put into the blood and spread throughout the body. The PICC is a long, thin, flexible tube (catheter) that is put into a vein in a person's arm or leg. The catheter ends in a large vein just outside the heart called the superior vena cava (SVC). After the PICC is put in, a chest X-ray may be done to make sure that it is in the right place. A PICC may be placed for different reasons, such as: To give medicines and liquid nutrition. To give IV fluids and blood products. To take blood samples often. If there is trouble placing a peripheral intravenous (PIV) catheter. If cared for properly, a PICC can remain in place for many months. Having a PICC can allow you to go home from the hospital sooner and continue treatment at home. Medicines and PICC care can be managed at home by a family member, caregiver, or home health care team. What are the risks? Generally, having a PICC is safe. However, problems may occur, including: A blood clot (thrombus) forming in or at the end of the PICC. A blood clot forming in a vein (deep vein thrombosis) or traveling to the lung (pulmonary embolism). Inflammation of the vein (phlebitis) in which the PICC is placed. Infection at the insertion site or in the blood. Blood infections from central lines, like PICCs, can be serious and often require a hospital stay. PICC malposition, or PICC movement or poor placement. A break or cut in the PICC. Do not use scissors near the PICC. Nerve or tendon irritation or injury during PICC insertion. How to care for your PICC Please follow the specific guidelines provided by your health care provider. Preventing infection You and any caregivers should wash your hands often with soap and water for at least 20 seconds. Wash hands: Before touching the PICC or the infusion device. Before changing a  bandage (dressing). Do not change the dressing unless you have been taught to do so and have shown you are able to change it safely. Flush the PICC as told. Tell your health care provider right away if the PICC is hard to flush or does not flush. Do not use force to flush the PICC. Use clean and germ-free (sterile) supplies only. Keep the supplies in a dry place. Do not reuse needles, syringes, or any other supplies. Reusing supplies can lead to infection. Keep the PICC dressing dry and secure it with tape if the edges stop sticking to your skin. Check your PICC insertion site every day for signs of infection. Check for: Redness, swelling, or pain. Fluid or blood. Warmth. Pus or a bad smell. Preventing other problems Do not use a syringe that is less than 10 mL to flush the PICC. Do not have your blood pressure checked on the arm in which the PICC is placed. Do not ever pull or tug on the PICC. Keep it secured to your arm with tape or a stretch wrap when not in use. Do not take the PICC out yourself. Only a trained health care provider should remove the PICC. Keep pets and children away from your PICC. How to care for your PICC dressing Keep your PICC dressing clean and dry to prevent infection. Do not take baths, swim, or use a hot tub until your health care provider approves. Ask your health care provider if you can take  showers. You may only be allowed to take sponge baths. When you are allowed to shower: Ask your health care provider to teach you how to wrap the PICC. Cover the PICC with clear plastic wrap and tape to keep it dry while showering. Follow instructions from your health care provider about how to take care of your insertion site and dressing. Make sure you: Wash your hands with soap and water for at least 20 seconds before and after you change your dressing. If soap and water are not available, use hand sanitizer. Change your dressing only if taught to do so by your health care  provider. Your PICC dressing needs to be changed if it becomes loose or wet. Leave stitches (sutures), skin glue, or adhesive strips in place. These skin closures may need to stay in place for 2 weeks or longer. If adhesive strip edges start to loosen and curl up, you may trim the loose edges. Do not remove adhesive strips completely unless your health care provider tells you to do that. Follow these instructions at home: Disposal of supplies Throw away any syringes in a disposal container that is meant for sharp items (sharps container). You can buy a sharps container from a pharmacy, or you can make one by using an empty, hard plastic bottle with a lid. Place any used dressings or infusion bags into a plastic bag. Throw that bag in the trash. General instructions  Always carry your PICC identification card or wear a medical alert bracelet. Keep the tube clamped at all times, unless it is being used. Always carry a smooth-edge clamp with you to clamp the PICC if it breaks. Do not use scissors or sharp objects near the tube. You may bend your arm and move it freely. If your PICC is near or at the bend of your elbow, avoid activity with repeated motion at the elbow. Avoid lifting heavy objects as told by your health care provider. Keep all follow-up visits. This is important. You will need to have your PICC dressing changed at least once a week. Contact a health care provider if: You have pain in your arm, ear, face, or teeth. You have a fever or chills. You have redness, swelling, or pain around the insertion site. You have fluid or blood coming from the insertion site. Your insertion site feels warm to the touch. You have pus or a bad smell coming from the insertion site. Your skin feels hard and raised around the insertion site. Your PICC dressing has gotten wet or is coming off and you have not been taught how to change it. Get help right away if: You have problems with your PICC, such as  your PICC: Was tugged or pulled and has partially come out. Do not  push the PICC back in. Cannot be flushed, is hard to flush, or leaks around the insertion site when it is flushed. Makes a flushing sound when it is flushed. Appears to have a hole or tear. Is accidentally pulled all the way out. If this happens, cover the insertion site with a gauze dressing. Do not throw the PICC away. Your health care provider will need to check it to be sure the entire catheter came out. You feel your heart racing or skipping beats, or you have chest pain. You have shortness of breath or trouble breathing. You have swelling, redness, warmth, or pain in the arm in which the PICC is placed. You have a red streak going up your arm that  starts under the PICC dressing. These symptoms may be an emergency. Get help right away. Call 911. Do not wait to see if the symptoms will go away. Do not drive yourself to the hospital. Summary A peripherally inserted central catheter (PICC) is a long, thin, flexible tube (catheter) that is put into a vein in the arm or leg. If cared for properly, a PICC can remain in place for many months. Having a PICC can allow you to go home from the hospital sooner and continue treatment at home. The PICC is inserted using a germ-free (sterile) technique by a specially trained health care provider. Only a trained health care provider should remove it. Do not have your blood pressure checked on the arm in which your PICC is placed. Always keep your PICC identification card with you. This information is not intended to replace advice given to you by your health care provider. Make sure you discuss any questions you have with your health care provider. Document Revised: 10/27/2020 Document Reviewed: 10/27/2020 Elsevier Patient Education  2024 [employer].

## 2023-02-07 ENCOUNTER — Inpatient Hospital Stay: Payer: No Typology Code available for payment source

## 2023-02-07 VITALS — BP 128/86 | HR 86 | Temp 98.0°F | Resp 16

## 2023-02-07 DIAGNOSIS — Z5112 Encounter for antineoplastic immunotherapy: Secondary | ICD-10-CM | POA: Diagnosis not present

## 2023-02-07 DIAGNOSIS — C180 Malignant neoplasm of cecum: Secondary | ICD-10-CM

## 2023-02-07 MED ORDER — HEPARIN SOD (PORK) LOCK FLUSH 100 UNIT/ML IV SOLN
250.0000 [IU] | Freq: Once | INTRAVENOUS | Status: AC
  Administered 2023-02-07: 250 [IU]

## 2023-02-07 MED ORDER — SODIUM CHLORIDE 0.9% FLUSH
10.0000 mL | Freq: Once | INTRAVENOUS | Status: AC
  Administered 2023-02-07: 10 mL

## 2023-02-07 NOTE — Patient Instructions (Signed)
PICC Insertion, Care After The following information offers guidance on how to care for yourself after your procedure. Your health care provider may also give you more specific instructions. If you have problems or questions, contact your health care provider. What can I expect after the procedure? After the procedure, it is common to have mild discomfort and bruising at your insertion site. Follow these instructions at home: Bathing  Do not take baths, swim, or use a hot tub until your health care provider approves. Ask your health care provider if you may take showers. You may only be allowed to take sponge baths. Keep the bandage (dressing) dry. If it gets wet, it needs to be changed right away to help prevent infection. Activity  Rest as told by your health care provider. Ask your health care provider when you can return to your normal activities. If your PICC is near or at the bend of your elbow, avoid activity with repeated motion at the elbow. Do not lift anything that is heavier than 10 lb (4.5 kg), or the limit that you are told, until your health care provider says that it is safe. If you use a crutch, do not use it with the arm on the same side as your PICC. You may need to use a walker instead. Avoid any activity that requires great effort as told by your health care provider. General instructions If you were given a sedative during the procedure, it can affect you for several hours. Do not drive or operate machinery until your health care provider says that it is safe. Take over-the-counter and prescription medicines only as told by your health care provider. Flush the PICC as told by your health care provider. Let your health care provider know right away if the PICC is hard to flush or does not flush. Do not use force to flush the PICC. Do not use any products that contain nicotine or tobacco. These products include cigarettes, chewing tobacco, and vaping devices, such as  e-cigarettes. These can delay healing after surgery. If you need help quitting, ask your health care provider. Check your insertion site every day for signs of infection. Check for: More redness, swelling, or pain. Fluid or blood. Warmth. Pus or a bad smell. Keep all follow-up visits. This is important. You will need to have your PICC dressing changed at least once a week. Contact a health care provider if: You have pain in your arm, ear, face, or teeth. You have a fever or chills. You have more redness, swelling, or pain around your insertion site. You have fluid or blood coming from your insertion site. Your insertion site feels warm to the touch. You have pus or a bad smell coming from your insertion site. Your vein feels hard and raised like a cord under the skin around your insertion site. Your PICC dressing has gotten wet. Your PICC dressing is coming off. Tape it on until it can be changed. Get help right away if: You have problems with the PICC. These include if your PICC: Is accidentally pulled all the way out. If this happens, cover the insertion site with a gauze dressing. Do not throw the PICC away. Your health care provider will need to check it to be sure the entire catheter came out. Cannot be flushed, is hard to flush, or leaks around the insertion site when flushed. Was tugged or pulled and has partially come out. Do not push the PICC back in. Makes a flushing sound when  flushed. Appears to have a hole or tear. You feel your heart racing or skipping beats, or you have chest pain. You have swelling, redness, warmth, or pain in the arm or leg where the PICC is placed. You have a red streak going up your arm that starts under your PICC dressing. You have numbness or tingling in your fingers, hand, or arm. These symptoms may be an emergency. Get help right away. Call 911. Do not wait to see if the symptoms will go away. Do not drive yourself to the hospital. Summary After  your procedure, it is common to have mild discomfort and bruising at your insertion site. Do not lift anything that is heavier than 10 lb (4.5 kg), or the limit that you are told, until your health care provider says that it is safe. Flush the PICC as told by your health care provider. Tell your health care provider right away if the PICC is hard to flush or does not flush. Do not use force to flush the PICC. Check your insertion site every day for signs of infection. This information is not intended to replace advice given to you by your health care provider. Make sure you discuss any questions you have with your health care provider. Document Revised: 10/27/2020 Document Reviewed: 10/27/2020 Elsevier Patient Education  2024 [employer].

## 2023-02-09 ENCOUNTER — Inpatient Hospital Stay: Payer: No Typology Code available for payment source

## 2023-02-09 VITALS — BP 120/84 | HR 91 | Temp 98.6°F | Resp 18

## 2023-02-09 DIAGNOSIS — C180 Malignant neoplasm of cecum: Secondary | ICD-10-CM

## 2023-02-09 DIAGNOSIS — Z5112 Encounter for antineoplastic immunotherapy: Secondary | ICD-10-CM | POA: Diagnosis not present

## 2023-02-09 NOTE — Patient Instructions (Signed)
PICC Home Care Guide A peripherally inserted central catheter (PICC) is a form of IV access that allows medicines and IV fluids to be quickly put into the blood and spread throughout the body. The PICC is a long, thin, flexible tube (catheter) that is put into a vein in a person's arm or leg. The catheter ends in a large vein just outside the heart called the superior vena cava (SVC). After the PICC is put in, a chest X-ray may be done to make sure that it is in the right place. A PICC may be placed for different reasons, such as: To give medicines and liquid nutrition. To give IV fluids and blood products. To take blood samples often. If there is trouble placing a peripheral intravenous (PIV) catheter. If cared for properly, a PICC can remain in place for many months. Having a PICC can allow you to go home from the hospital sooner and continue treatment at home. Medicines and PICC care can be managed at home by a family member, caregiver, or home health care team. What are the risks? Generally, having a PICC is safe. However, problems may occur, including: A blood clot (thrombus) forming in or at the end of the PICC. A blood clot forming in a vein (deep vein thrombosis) or traveling to the lung (pulmonary embolism). Inflammation of the vein (phlebitis) in which the PICC is placed. Infection at the insertion site or in the blood. Blood infections from central lines, like PICCs, can be serious and often require a hospital stay. PICC malposition, or PICC movement or poor placement. A break or cut in the PICC. Do not use scissors near the PICC. Nerve or tendon irritation or injury during PICC insertion. How to care for your PICC Please follow the specific guidelines provided by your health care provider. Preventing infection You and any caregivers should wash your hands often with soap and water for at least 20 seconds. Wash hands: Before touching the PICC or the infusion device. Before changing a  bandage (dressing). Do not change the dressing unless you have been taught to do so and have shown you are able to change it safely. Flush the PICC as told. Tell your health care provider right away if the PICC is hard to flush or does not flush. Do not use force to flush the PICC. Use clean and germ-free (sterile) supplies only. Keep the supplies in a dry place. Do not reuse needles, syringes, or any other supplies. Reusing supplies can lead to infection. Keep the PICC dressing dry and secure it with tape if the edges stop sticking to your skin. Check your PICC insertion site every day for signs of infection. Check for: Redness, swelling, or pain. Fluid or blood. Warmth. Pus or a bad smell. Preventing other problems Do not use a syringe that is less than 10 mL to flush the PICC. Do not have your blood pressure checked on the arm in which the PICC is placed. Do not ever pull or tug on the PICC. Keep it secured to your arm with tape or a stretch wrap when not in use. Do not take the PICC out yourself. Only a trained health care provider should remove the PICC. Keep pets and children away from your PICC. How to care for your PICC dressing Keep your PICC dressing clean and dry to prevent infection. Do not take baths, swim, or use a hot tub until your health care provider approves. Ask your health care provider if you can take  showers. You may only be allowed to take sponge baths. When you are allowed to shower: Ask your health care provider to teach you how to wrap the PICC. Cover the PICC with clear plastic wrap and tape to keep it dry while showering. Follow instructions from your health care provider about how to take care of your insertion site and dressing. Make sure you: Wash your hands with soap and water for at least 20 seconds before and after you change your dressing. If soap and water are not available, use hand sanitizer. Change your dressing only if taught to do so by your health care  provider. Your PICC dressing needs to be changed if it becomes loose or wet. Leave stitches (sutures), skin glue, or adhesive strips in place. These skin closures may need to stay in place for 2 weeks or longer. If adhesive strip edges start to loosen and curl up, you may trim the loose edges. Do not remove adhesive strips completely unless your health care provider tells you to do that. Follow these instructions at home: Disposal of supplies Throw away any syringes in a disposal container that is meant for sharp items (sharps container). You can buy a sharps container from a pharmacy, or you can make one by using an empty, hard plastic bottle with a lid. Place any used dressings or infusion bags into a plastic bag. Throw that bag in the trash. General instructions  Always carry your PICC identification card or wear a medical alert bracelet. Keep the tube clamped at all times, unless it is being used. Always carry a smooth-edge clamp with you to clamp the PICC if it breaks. Do not use scissors or sharp objects near the tube. You may bend your arm and move it freely. If your PICC is near or at the bend of your elbow, avoid activity with repeated motion at the elbow. Avoid lifting heavy objects as told by your health care provider. Keep all follow-up visits. This is important. You will need to have your PICC dressing changed at least once a week. Contact a health care provider if: You have pain in your arm, ear, face, or teeth. You have a fever or chills. You have redness, swelling, or pain around the insertion site. You have fluid or blood coming from the insertion site. Your insertion site feels warm to the touch. You have pus or a bad smell coming from the insertion site. Your skin feels hard and raised around the insertion site. Your PICC dressing has gotten wet or is coming off and you have not been taught how to change it. Get help right away if: You have problems with your PICC, such as  your PICC: Was tugged or pulled and has partially come out. Do not  push the PICC back in. Cannot be flushed, is hard to flush, or leaks around the insertion site when it is flushed. Makes a flushing sound when it is flushed. Appears to have a hole or tear. Is accidentally pulled all the way out. If this happens, cover the insertion site with a gauze dressing. Do not throw the PICC away. Your health care provider will need to check it to be sure the entire catheter came out. You feel your heart racing or skipping beats, or you have chest pain. You have shortness of breath or trouble breathing. You have swelling, redness, warmth, or pain in the arm in which the PICC is placed. You have a red streak going up your arm that  starts under the PICC dressing. These symptoms may be an emergency. Get help right away. Call 911. Do not wait to see if the symptoms will go away. Do not drive yourself to the hospital. Summary A peripherally inserted central catheter (PICC) is a long, thin, flexible tube (catheter) that is put into a vein in the arm or leg. If cared for properly, a PICC can remain in place for many months. Having a PICC can allow you to go home from the hospital sooner and continue treatment at home. The PICC is inserted using a germ-free (sterile) technique by a specially trained health care provider. Only a trained health care provider should remove it. Do not have your blood pressure checked on the arm in which your PICC is placed. Always keep your PICC identification card with you. This information is not intended to replace advice given to you by your health care provider. Make sure you discuss any questions you have with your health care provider. Document Revised: 10/27/2020 Document Reviewed: 10/27/2020 Elsevier Patient Education  2024 [employer].

## 2023-02-12 ENCOUNTER — Inpatient Hospital Stay: Payer: No Typology Code available for payment source

## 2023-02-12 VITALS — BP 102/68 | HR 71 | Temp 98.1°F | Resp 19

## 2023-02-12 DIAGNOSIS — C180 Malignant neoplasm of cecum: Secondary | ICD-10-CM

## 2023-02-12 DIAGNOSIS — T451X5A Adverse effect of antineoplastic and immunosuppressive drugs, initial encounter: Secondary | ICD-10-CM

## 2023-02-12 DIAGNOSIS — Z5112 Encounter for antineoplastic immunotherapy: Secondary | ICD-10-CM | POA: Diagnosis not present

## 2023-02-12 LAB — CBC WITH DIFFERENTIAL (CANCER CENTER ONLY)
Abs Immature Granulocytes: 0.05 10*3/uL (ref 0.00–0.07)
Basophils Absolute: 0 10*3/uL (ref 0.0–0.1)
Basophils Relative: 1 %
Eosinophils Absolute: 0 10*3/uL (ref 0.0–0.5)
Eosinophils Relative: 0 %
HCT: 36.8 % (ref 36.0–46.0)
Hemoglobin: 11.8 g/dL — ABNORMAL LOW (ref 12.0–15.0)
Immature Granulocytes: 1 %
Lymphocytes Relative: 36 %
Lymphs Abs: 1.8 10*3/uL (ref 0.7–4.0)
MCH: 28.3 pg (ref 26.0–34.0)
MCHC: 32.1 g/dL (ref 30.0–36.0)
MCV: 88.2 fL (ref 80.0–100.0)
Monocytes Absolute: 0.6 10*3/uL (ref 0.1–1.0)
Monocytes Relative: 12 %
Neutro Abs: 2.5 10*3/uL (ref 1.7–7.7)
Neutrophils Relative %: 50 %
Platelet Count: 166 10*3/uL (ref 150–400)
RBC: 4.17 MIL/uL (ref 3.87–5.11)
RDW: 17.5 % — ABNORMAL HIGH (ref 11.5–15.5)
WBC Count: 4.9 10*3/uL (ref 4.0–10.5)
nRBC: 0 % (ref 0.0–0.2)

## 2023-02-12 LAB — CMP (CANCER CENTER ONLY)
ALT: 9 U/L (ref 0–44)
AST: 15 U/L (ref 15–41)
Albumin: 3.7 g/dL (ref 3.5–5.0)
Alkaline Phosphatase: 93 U/L (ref 38–126)
Anion gap: 10 (ref 5–15)
BUN: 10 mg/dL (ref 6–20)
CO2: 30 mmol/L (ref 22–32)
Calcium: 8.9 mg/dL (ref 8.9–10.3)
Chloride: 104 mmol/L (ref 98–111)
Creatinine: 0.85 mg/dL (ref 0.44–1.00)
GFR, Estimated: >60 mL/min (ref >60)
Glucose, Bld: 111 mg/dL — ABNORMAL HIGH (ref 70–99)
Potassium: 4.3 mmol/L (ref 3.5–5.1)
Sodium: 144 mmol/L (ref 135–145)
Total Bilirubin: 0.3 mg/dL (ref 0.3–1.2)
Total Protein: 6.3 g/dL — ABNORMAL LOW (ref 6.5–8.1)

## 2023-02-12 LAB — TOTAL PROTEIN, URINE DIPSTICK: Protein, ur: NEGATIVE mg/dL

## 2023-02-12 MED ORDER — SODIUM CHLORIDE 0.9 % IV SOLN
10.0000 mg | Freq: Once | INTRAVENOUS | Status: AC
  Administered 2023-02-12: 10 mg via INTRAVENOUS
  Filled 2023-02-12: qty 10

## 2023-02-12 MED ORDER — DEXTROSE 5 % IV SOLN: Freq: Once | INTRAVENOUS | Status: DC

## 2023-02-12 MED ORDER — LEUCOVORIN CALCIUM INJECTION 350 MG
400.0000 mg/m2 | Freq: Once | INTRAVENOUS | Status: AC
  Administered 2023-02-12: 928 mg via INTRAVENOUS
  Filled 2023-02-12: qty 46.4

## 2023-02-12 MED ORDER — PALONOSETRON HCL INJECTION 0.25 MG/5ML
0.2500 mg | Freq: Once | INTRAVENOUS | Status: AC
  Administered 2023-02-12: 0.25 mg via INTRAVENOUS
  Filled 2023-02-12: qty 5

## 2023-02-12 MED ORDER — ALTEPLASE 2 MG IJ SOLR
2.0000 mg | Freq: Once | INTRAMUSCULAR | Status: AC | PRN
  Administered 2023-02-12: 2 mg
  Filled 2023-02-12: qty 2

## 2023-02-12 MED ORDER — FLUOROURACIL CHEMO INJECTION 2.5 GM/50ML
400.0000 mg/m2 | Freq: Once | INTRAVENOUS | Status: AC
  Administered 2023-02-12: 1000 mg via INTRAVENOUS
  Filled 2023-02-12: qty 20

## 2023-02-12 MED ORDER — SODIUM CHLORIDE 0.9 % IV SOLN
2400.0000 mg/m2 | INTRAVENOUS | Status: DC
  Administered 2023-02-12: 5550 mg via INTRAVENOUS
  Filled 2023-02-12: qty 111

## 2023-02-12 MED ORDER — BEVACIZUMAB-ADCD CHEMO INJECTION 400 MG/16ML
5.0000 mg/kg | Freq: Once | INTRAVENOUS | Status: AC
  Administered 2023-02-12: 600 mg via INTRAVENOUS
  Filled 2023-02-12: qty 16

## 2023-02-12 MED ORDER — DEXTROSE 5 % IV SOLN: Freq: Once | INTRAVENOUS | Status: AC

## 2023-02-12 MED ORDER — SODIUM CHLORIDE 0.9 % IV SOLN: Freq: Once | INTRAVENOUS | Status: DC

## 2023-02-12 MED ORDER — OXALIPLATIN CHEMO INJECTION 100 MG/20ML
85.0000 mg/m2 | Freq: Once | INTRAVENOUS | Status: AC
  Administered 2023-02-12: 200 mg via INTRAVENOUS
  Filled 2023-02-12: qty 40

## 2023-02-12 NOTE — Patient Instructions (Signed)
PICC Home Care Guide A peripherally inserted central catheter (PICC) is a form of IV access that allows medicines and IV fluids to be quickly put into the blood and spread throughout the body. The PICC is a long, thin, flexible tube (catheter) that is put into a vein in a person's arm or leg. The catheter ends in a large vein just outside the heart called the superior vena cava (SVC). After the PICC is put in, a chest X-ray may be done to make sure that it is in the right place. A PICC may be placed for different reasons, such as: To give medicines and liquid nutrition. To give IV fluids and blood products. To take blood samples often. If there is trouble placing a peripheral intravenous (PIV) catheter. If cared for properly, a PICC can remain in place for many months. Having a PICC can allow you to go home from the hospital sooner and continue treatment at home. Medicines and PICC care can be managed at home by a family member, caregiver, or home health care team. What are the risks? Generally, having a PICC is safe. However, problems may occur, including: A blood clot (thrombus) forming in or at the end of the PICC. A blood clot forming in a vein (deep vein thrombosis) or traveling to the lung (pulmonary embolism). Inflammation of the vein (phlebitis) in which the PICC is placed. Infection at the insertion site or in the blood. Blood infections from central lines, like PICCs, can be serious and often require a hospital stay. PICC malposition, or PICC movement or poor placement. A break or cut in the PICC. Do not use scissors near the PICC. Nerve or tendon irritation or injury during PICC insertion. How to care for your PICC Please follow the specific guidelines provided by your health care provider. Preventing infection You and any caregivers should wash your hands often with soap and water for at least 20 seconds. Wash hands: Before touching the PICC or the infusion device. Before changing a  bandage (dressing). Do not change the dressing unless you have been taught to do so and have shown you are able to change it safely. Flush the PICC as told. Tell your health care provider right away if the PICC is hard to flush or does not flush. Do not use force to flush the PICC. Use clean and germ-free (sterile) supplies only. Keep the supplies in a dry place. Do not reuse needles, syringes, or any other supplies. Reusing supplies can lead to infection. Keep the PICC dressing dry and secure it with tape if the edges stop sticking to your skin. Check your PICC insertion site every day for signs of infection. Check for: Redness, swelling, or pain. Fluid or blood. Warmth. Pus or a bad smell. Preventing other problems Do not use a syringe that is less than 10 mL to flush the PICC. Do not have your blood pressure checked on the arm in which the PICC is placed. Do not ever pull or tug on the PICC. Keep it secured to your arm with tape or a stretch wrap when not in use. Do not take the PICC out yourself. Only a trained health care provider should remove the PICC. Keep pets and children away from your PICC. How to care for your PICC dressing Keep your PICC dressing clean and dry to prevent infection. Do not take baths, swim, or use a hot tub until your health care provider approves. Ask your health care provider if you can take  showers. You may only be allowed to take sponge baths. When you are allowed to shower: Ask your health care provider to teach you how to wrap the PICC. Cover the PICC with clear plastic wrap and tape to keep it dry while showering. Follow instructions from your health care provider about how to take care of your insertion site and dressing. Make sure you: Wash your hands with soap and water for at least 20 seconds before and after you change your dressing. If soap and water are not available, use hand sanitizer. Change your dressing only if taught to do so by your health care  provider. Your PICC dressing needs to be changed if it becomes loose or wet. Leave stitches (sutures), skin glue, or adhesive strips in place. These skin closures may need to stay in place for 2 weeks or longer. If adhesive strip edges start to loosen and curl up, you may trim the loose edges. Do not remove adhesive strips completely unless your health care provider tells you to do that. Follow these instructions at home: Disposal of supplies Throw away any syringes in a disposal container that is meant for sharp items (sharps container). You can buy a sharps container from a pharmacy, or you can make one by using an empty, hard plastic bottle with a lid. Place any used dressings or infusion bags into a plastic bag. Throw that bag in the trash. General instructions  Always carry your PICC identification card or wear a medical alert bracelet. Keep the tube clamped at all times, unless it is being used. Always carry a smooth-edge clamp with you to clamp the PICC if it breaks. Do not use scissors or sharp objects near the tube. You may bend your arm and move it freely. If your PICC is near or at the bend of your elbow, avoid activity with repeated motion at the elbow. Avoid lifting heavy objects as told by your health care provider. Keep all follow-up visits. This is important. You will need to have your PICC dressing changed at least once a week. Contact a health care provider if: You have pain in your arm, ear, face, or teeth. You have a fever or chills. You have redness, swelling, or pain around the insertion site. You have fluid or blood coming from the insertion site. Your insertion site feels warm to the touch. You have pus or a bad smell coming from the insertion site. Your skin feels hard and raised around the insertion site. Your PICC dressing has gotten wet or is coming off and you have not been taught how to change it. Get help right away if: You have problems with your PICC, such as  your PICC: Was tugged or pulled and has partially come out. Do not  push the PICC back in. Cannot be flushed, is hard to flush, or leaks around the insertion site when it is flushed. Makes a flushing sound when it is flushed. Appears to have a hole or tear. Is accidentally pulled all the way out. If this happens, cover the insertion site with a gauze dressing. Do not throw the PICC away. Your health care provider will need to check it to be sure the entire catheter came out. You feel your heart racing or skipping beats, or you have chest pain. You have shortness of breath or trouble breathing. You have swelling, redness, warmth, or pain in the arm in which the PICC is placed. You have a red streak going up your arm that  starts under the PICC dressing. These symptoms may be an emergency. Get help right away. Call 911. Do not wait to see if the symptoms will go away. Do not drive yourself to the hospital. Summary A peripherally inserted central catheter (PICC) is a long, thin, flexible tube (catheter) that is put into a vein in the arm or leg. If cared for properly, a PICC can remain in place for many months. Having a PICC can allow you to go home from the hospital sooner and continue treatment at home. The PICC is inserted using a germ-free (sterile) technique by a specially trained health care provider. Only a trained health care provider should remove it. Do not have your blood pressure checked on the arm in which your PICC is placed. Always keep your PICC identification card with you. This information is not intended to replace advice given to you by your health care provider. Make sure you discuss any questions you have with your health care provider. Document Revised: 10/27/2020 Document Reviewed: 10/27/2020 Elsevier Patient Education  2024 [employer].

## 2023-02-12 NOTE — Progress Notes (Signed)
Pt's picc line accessed. Site flushes well, no pain or swelling noted. Cath-Flo instilled per policy at 0830. At 0930 appox  4 mls of blood return noted. Site will flush well, no swelling or pain noted.

## 2023-02-14 ENCOUNTER — Inpatient Hospital Stay: Payer: No Typology Code available for payment source

## 2023-02-14 ENCOUNTER — Other Ambulatory Visit: Payer: Self-pay

## 2023-02-14 VITALS — BP 133/82 | HR 80 | Temp 97.5°F | Resp 18

## 2023-02-14 DIAGNOSIS — Z5112 Encounter for antineoplastic immunotherapy: Secondary | ICD-10-CM | POA: Diagnosis not present

## 2023-02-14 DIAGNOSIS — C180 Malignant neoplasm of cecum: Secondary | ICD-10-CM

## 2023-02-14 MED ORDER — HEPARIN SOD (PORK) LOCK FLUSH 100 UNIT/ML IV SOLN
250.0000 [IU] | Freq: Once | INTRAVENOUS | Status: AC | PRN
  Administered 2023-02-14: 250 [IU]

## 2023-02-14 MED ORDER — SODIUM CHLORIDE 0.9% FLUSH
10.0000 mL | INTRAVENOUS | Status: DC | PRN
  Administered 2023-02-14: 10 mL

## 2023-02-14 MED ORDER — PEGFILGRASTIM-CBQV 6 MG/0.6ML ~~LOC~~ SOSY
6.0000 mg | PREFILLED_SYRINGE | Freq: Once | SUBCUTANEOUS | Status: AC
  Administered 2023-02-14: 6 mg via SUBCUTANEOUS
  Filled 2023-02-14: qty 0.6

## 2023-02-14 NOTE — Patient Instructions (Signed)

## 2023-02-16 ENCOUNTER — Inpatient Hospital Stay: Payer: No Typology Code available for payment source

## 2023-02-16 VITALS — BP 91/80 | HR 97 | Temp 98.2°F | Resp 18

## 2023-02-16 DIAGNOSIS — Z5112 Encounter for antineoplastic immunotherapy: Secondary | ICD-10-CM | POA: Diagnosis not present

## 2023-02-16 DIAGNOSIS — C180 Malignant neoplasm of cecum: Secondary | ICD-10-CM

## 2023-02-16 MED ORDER — HEPARIN SOD (PORK) LOCK FLUSH 100 UNIT/ML IV SOLN
500.0000 [IU] | Freq: Once | INTRAVENOUS | Status: AC
  Administered 2023-02-16: 500 [IU]

## 2023-02-16 MED ORDER — SODIUM CHLORIDE 0.9% FLUSH
10.0000 mL | Freq: Once | INTRAVENOUS | Status: AC
  Administered 2023-02-16: 10 mL

## 2023-02-16 NOTE — Patient Instructions (Signed)
PICC Home Care Guide A peripherally inserted central catheter (PICC) is a form of IV access that allows medicines and IV fluids to be quickly put into the blood and spread throughout the body. The PICC is a long, thin, flexible tube (catheter) that is put into a vein in a person's arm or leg. The catheter ends in a large vein just outside the heart called the superior vena cava (SVC). After the PICC is put in, a chest X-ray may be done to make sure that it is in the right place. A PICC may be placed for different reasons, such as: To give medicines and liquid nutrition. To give IV fluids and blood products. To take blood samples often. If there is trouble placing a peripheral intravenous (PIV) catheter. If cared for properly, a PICC can remain in place for many months. Having a PICC can allow you to go home from the hospital sooner and continue treatment at home. Medicines and PICC care can be managed at home by a family member, caregiver, or home health care team. What are the risks? Generally, having a PICC is safe. However, problems may occur, including: A blood clot (thrombus) forming in or at the end of the PICC. A blood clot forming in a vein (deep vein thrombosis) or traveling to the lung (pulmonary embolism). Inflammation of the vein (phlebitis) in which the PICC is placed. Infection at the insertion site or in the blood. Blood infections from central lines, like PICCs, can be serious and often require a hospital stay. PICC malposition, or PICC movement or poor placement. A break or cut in the PICC. Do not use scissors near the PICC. Nerve or tendon irritation or injury during PICC insertion. How to care for your PICC Please follow the specific guidelines provided by your health care provider. Preventing infection You and any caregivers should wash your hands often with soap and water for at least 20 seconds. Wash hands: Before touching the PICC or the infusion device. Before changing a  bandage (dressing). Do not change the dressing unless you have been taught to do so and have shown you are able to change it safely. Flush the PICC as told. Tell your health care provider right away if the PICC is hard to flush or does not flush. Do not use force to flush the PICC. Use clean and germ-free (sterile) supplies only. Keep the supplies in a dry place. Do not reuse needles, syringes, or any other supplies. Reusing supplies can lead to infection. Keep the PICC dressing dry and secure it with tape if the edges stop sticking to your skin. Check your PICC insertion site every day for signs of infection. Check for: Redness, swelling, or pain. Fluid or blood. Warmth. Pus or a bad smell. Preventing other problems Do not use a syringe that is less than 10 mL to flush the PICC. Do not have your blood pressure checked on the arm in which the PICC is placed. Do not ever pull or tug on the PICC. Keep it secured to your arm with tape or a stretch wrap when not in use. Do not take the PICC out yourself. Only a trained health care provider should remove the PICC. Keep pets and children away from your PICC. How to care for your PICC dressing Keep your PICC dressing clean and dry to prevent infection. Do not take baths, swim, or use a hot tub until your health care provider approves. Ask your health care provider if you can take  showers. You may only be allowed to take sponge baths. When you are allowed to shower: Ask your health care provider to teach you how to wrap the PICC. Cover the PICC with clear plastic wrap and tape to keep it dry while showering. Follow instructions from your health care provider about how to take care of your insertion site and dressing. Make sure you: Wash your hands with soap and water for at least 20 seconds before and after you change your dressing. If soap and water are not available, use hand sanitizer. Change your dressing only if taught to do so by your health care  provider. Your PICC dressing needs to be changed if it becomes loose or wet. Leave stitches (sutures), skin glue, or adhesive strips in place. These skin closures may need to stay in place for 2 weeks or longer. If adhesive strip edges start to loosen and curl up, you may trim the loose edges. Do not remove adhesive strips completely unless your health care provider tells you to do that. Follow these instructions at home: Disposal of supplies Throw away any syringes in a disposal container that is meant for sharp items (sharps container). You can buy a sharps container from a pharmacy, or you can make one by using an empty, hard plastic bottle with a lid. Place any used dressings or infusion bags into a plastic bag. Throw that bag in the trash. General instructions  Always carry your PICC identification card or wear a medical alert bracelet. Keep the tube clamped at all times, unless it is being used. Always carry a smooth-edge clamp with you to clamp the PICC if it breaks. Do not use scissors or sharp objects near the tube. You may bend your arm and move it freely. If your PICC is near or at the bend of your elbow, avoid activity with repeated motion at the elbow. Avoid lifting heavy objects as told by your health care provider. Keep all follow-up visits. This is important. You will need to have your PICC dressing changed at least once a week. Contact a health care provider if: You have pain in your arm, ear, face, or teeth. You have a fever or chills. You have redness, swelling, or pain around the insertion site. You have fluid or blood coming from the insertion site. Your insertion site feels warm to the touch. You have pus or a bad smell coming from the insertion site. Your skin feels hard and raised around the insertion site. Your PICC dressing has gotten wet or is coming off and you have not been taught how to change it. Get help right away if: You have problems with your PICC, such as  your PICC: Was tugged or pulled and has partially come out. Do not  push the PICC back in. Cannot be flushed, is hard to flush, or leaks around the insertion site when it is flushed. Makes a flushing sound when it is flushed. Appears to have a hole or tear. Is accidentally pulled all the way out. If this happens, cover the insertion site with a gauze dressing. Do not throw the PICC away. Your health care provider will need to check it to be sure the entire catheter came out. You feel your heart racing or skipping beats, or you have chest pain. You have shortness of breath or trouble breathing. You have swelling, redness, warmth, or pain in the arm in which the PICC is placed. You have a red streak going up your arm that  starts under the PICC dressing. These symptoms may be an emergency. Get help right away. Call 911. Do not wait to see if the symptoms will go away. Do not drive yourself to the hospital. Summary A peripherally inserted central catheter (PICC) is a long, thin, flexible tube (catheter) that is put into a vein in the arm or leg. If cared for properly, a PICC can remain in place for many months. Having a PICC can allow you to go home from the hospital sooner and continue treatment at home. The PICC is inserted using a germ-free (sterile) technique by a specially trained health care provider. Only a trained health care provider should remove it. Do not have your blood pressure checked on the arm in which your PICC is placed. Always keep your PICC identification card with you. This information is not intended to replace advice given to you by your health care provider. Make sure you discuss any questions you have with your health care provider. Document Revised: 10/27/2020 Document Reviewed: 10/27/2020 Elsevier Patient Education  2024 [employer].

## 2023-02-19 ENCOUNTER — Inpatient Hospital Stay: Payer: No Typology Code available for payment source

## 2023-02-19 VITALS — BP 122/92 | Temp 97.9°F | Resp 17

## 2023-02-19 DIAGNOSIS — Z5112 Encounter for antineoplastic immunotherapy: Secondary | ICD-10-CM | POA: Diagnosis not present

## 2023-02-19 DIAGNOSIS — Z452 Encounter for adjustment and management of vascular access device: Secondary | ICD-10-CM

## 2023-02-19 MED ORDER — HEPARIN SOD (PORK) LOCK FLUSH 10 UNIT/ML IV SOLN: 10.0000 [IU] | Freq: Once | INTRAVENOUS | Status: AC

## 2023-02-19 MED ORDER — SODIUM CHLORIDE 0.9% FLUSH
3.0000 mL | Freq: Once | INTRAVENOUS | Status: AC
  Administered 2023-02-19: 3 mL

## 2023-02-19 MED ORDER — HEPARIN SOD (PORK) LOCK FLUSH 100 UNIT/ML IV SOLN
500.0000 [IU] | Freq: Once | INTRAVENOUS | Status: AC
  Administered 2023-02-19: 500 [IU] via INTRAVENOUS

## 2023-02-19 NOTE — Addendum Note (Signed)
Addended by: Katheran Awe on: 02/19/2023 03:37 PM   Modules accepted: Orders

## 2023-02-21 ENCOUNTER — Inpatient Hospital Stay: Payer: No Typology Code available for payment source

## 2023-02-21 VITALS — BP 121/84 | HR 100 | Temp 97.9°F | Resp 17

## 2023-02-21 DIAGNOSIS — Z452 Encounter for adjustment and management of vascular access device: Secondary | ICD-10-CM

## 2023-02-21 DIAGNOSIS — Z5112 Encounter for antineoplastic immunotherapy: Secondary | ICD-10-CM | POA: Diagnosis not present

## 2023-02-21 MED ORDER — SODIUM CHLORIDE 0.9% FLUSH
10.0000 mL | Freq: Once | INTRAVENOUS | Status: AC
  Administered 2023-02-21: 10 mL via INTRAVENOUS

## 2023-02-21 MED ORDER — HEPARIN SOD (PORK) LOCK FLUSH 100 UNIT/ML IV SOLN
500.0000 [IU] | Freq: Once | INTRAVENOUS | Status: AC
  Administered 2023-02-21: 250 [IU] via INTRAVENOUS

## 2023-02-21 NOTE — Progress Notes (Unsigned)
Lauren Mcpherson is not available from the manufacturer. Pegfilgrastim changed to Fulphila.

## 2023-02-21 NOTE — Patient Instructions (Signed)
PICC Home Care Guide A peripherally inserted central catheter (PICC) is a form of IV access that allows medicines and IV fluids to be quickly put into the blood and spread throughout the body. The PICC is a long, thin, flexible tube (catheter) that is put into a vein in a person's arm or leg. The catheter ends in a large vein just outside the heart called the superior vena cava (SVC). After the PICC is put in, a chest X-ray may be done to make sure that it is in the right place. A PICC may be placed for different reasons, such as: To give medicines and liquid nutrition. To give IV fluids and blood products. To take blood samples often. If there is trouble placing a peripheral intravenous (PIV) catheter. If cared for properly, a PICC can remain in place for many months. Having a PICC can allow you to go home from the hospital sooner and continue treatment at home. Medicines and PICC care can be managed at home by a family member, caregiver, or home health care team. What are the risks? Generally, having a PICC is safe. However, problems may occur, including: A blood clot (thrombus) forming in or at the end of the PICC. A blood clot forming in a vein (deep vein thrombosis) or traveling to the lung (pulmonary embolism). Inflammation of the vein (phlebitis) in which the PICC is placed. Infection at the insertion site or in the blood. Blood infections from central lines, like PICCs, can be serious and often require a hospital stay. PICC malposition, or PICC movement or poor placement. A break or cut in the PICC. Do not use scissors near the PICC. Nerve or tendon irritation or injury during PICC insertion. How to care for your PICC Please follow the specific guidelines provided by your health care provider. Preventing infection You and any caregivers should wash your hands often with soap and water for at least 20 seconds. Wash hands: Before touching the PICC or the infusion device. Before changing a  bandage (dressing). Do not change the dressing unless you have been taught to do so and have shown you are able to change it safely. Flush the PICC as told. Tell your health care provider right away if the PICC is hard to flush or does not flush. Do not use force to flush the PICC. Use clean and germ-free (sterile) supplies only. Keep the supplies in a dry place. Do not reuse needles, syringes, or any other supplies. Reusing supplies can lead to infection. Keep the PICC dressing dry and secure it with tape if the edges stop sticking to your skin. Check your PICC insertion site every day for signs of infection. Check for: Redness, swelling, or pain. Fluid or blood. Warmth. Pus or a bad smell. Preventing other problems Do not use a syringe that is less than 10 mL to flush the PICC. Do not have your blood pressure checked on the arm in which the PICC is placed. Do not ever pull or tug on the PICC. Keep it secured to your arm with tape or a stretch wrap when not in use. Do not take the PICC out yourself. Only a trained health care provider should remove the PICC. Keep pets and children away from your PICC. How to care for your PICC dressing Keep your PICC dressing clean and dry to prevent infection. Do not take baths, swim, or use a hot tub until your health care provider approves. Ask your health care provider if you can take  showers. You may only be allowed to take sponge baths. When you are allowed to shower: Ask your health care provider to teach you how to wrap the PICC. Cover the PICC with clear plastic wrap and tape to keep it dry while showering. Follow instructions from your health care provider about how to take care of your insertion site and dressing. Make sure you: Wash your hands with soap and water for at least 20 seconds before and after you change your dressing. If soap and water are not available, use hand sanitizer. Change your dressing only if taught to do so by your health care  provider. Your PICC dressing needs to be changed if it becomes loose or wet. Leave stitches (sutures), skin glue, or adhesive strips in place. These skin closures may need to stay in place for 2 weeks or longer. If adhesive strip edges start to loosen and curl up, you may trim the loose edges. Do not remove adhesive strips completely unless your health care provider tells you to do that. Follow these instructions at home: Disposal of supplies Throw away any syringes in a disposal container that is meant for sharp items (sharps container). You can buy a sharps container from a pharmacy, or you can make one by using an empty, hard plastic bottle with a lid. Place any used dressings or infusion bags into a plastic bag. Throw that bag in the trash. General instructions  Always carry your PICC identification card or wear a medical alert bracelet. Keep the tube clamped at all times, unless it is being used. Always carry a smooth-edge clamp with you to clamp the PICC if it breaks. Do not use scissors or sharp objects near the tube. You may bend your arm and move it freely. If your PICC is near or at the bend of your elbow, avoid activity with repeated motion at the elbow. Avoid lifting heavy objects as told by your health care provider. Keep all follow-up visits. This is important. You will need to have your PICC dressing changed at least once a week. Contact a health care provider if: You have pain in your arm, ear, face, or teeth. You have a fever or chills. You have redness, swelling, or pain around the insertion site. You have fluid or blood coming from the insertion site. Your insertion site feels warm to the touch. You have pus or a bad smell coming from the insertion site. Your skin feels hard and raised around the insertion site. Your PICC dressing has gotten wet or is coming off and you have not been taught how to change it. Get help right away if: You have problems with your PICC, such as  your PICC: Was tugged or pulled and has partially come out. Do not  push the PICC back in. Cannot be flushed, is hard to flush, or leaks around the insertion site when it is flushed. Makes a flushing sound when it is flushed. Appears to have a hole or tear. Is accidentally pulled all the way out. If this happens, cover the insertion site with a gauze dressing. Do not throw the PICC away. Your health care provider will need to check it to be sure the entire catheter came out. You feel your heart racing or skipping beats, or you have chest pain. You have shortness of breath or trouble breathing. You have swelling, redness, warmth, or pain in the arm in which the PICC is placed. You have a red streak going up your arm that  starts under the PICC dressing. These symptoms may be an emergency. Get help right away. Call 911. Do not wait to see if the symptoms will go away. Do not drive yourself to the hospital. Summary A peripherally inserted central catheter (PICC) is a long, thin, flexible tube (catheter) that is put into a vein in the arm or leg. If cared for properly, a PICC can remain in place for many months. Having a PICC can allow you to go home from the hospital sooner and continue treatment at home. The PICC is inserted using a germ-free (sterile) technique by a specially trained health care provider. Only a trained health care provider should remove it. Do not have your blood pressure checked on the arm in which your PICC is placed. Always keep your PICC identification card with you. This information is not intended to replace advice given to you by your health care provider. Make sure you discuss any questions you have with your health care provider. Document Revised: 10/27/2020 Document Reviewed: 10/27/2020 Elsevier Patient Education  2024 [employer].

## 2023-02-23 ENCOUNTER — Inpatient Hospital Stay: Payer: No Typology Code available for payment source

## 2023-02-23 ENCOUNTER — Telehealth: Payer: Self-pay | Admitting: *Deleted

## 2023-02-23 NOTE — Telephone Encounter (Signed)
Call received from patient stating that she would like to cancel today's PICC line flush.  She states that she is feeling tired today and would like to wait until her appts on Monday to come in.  Pt denies any chills, fever, pain or nausea and is eating and drinking without difficulty.  Appt canceled and Dr. Myna Hidalgo notified.

## 2023-02-26 ENCOUNTER — Inpatient Hospital Stay: Payer: No Typology Code available for payment source | Attending: Hematology & Oncology | Admitting: Medical Oncology

## 2023-02-26 ENCOUNTER — Inpatient Hospital Stay: Payer: No Typology Code available for payment source

## 2023-02-26 ENCOUNTER — Other Ambulatory Visit: Payer: Self-pay

## 2023-02-26 ENCOUNTER — Encounter: Payer: Self-pay | Admitting: Medical Oncology

## 2023-02-26 VITALS — BP 106/74 | HR 78 | Temp 98.3°F | Resp 18 | Ht 64.0 in | Wt 260.0 lb

## 2023-02-26 DIAGNOSIS — C180 Malignant neoplasm of cecum: Secondary | ICD-10-CM

## 2023-02-26 DIAGNOSIS — Z5189 Encounter for other specified aftercare: Secondary | ICD-10-CM | POA: Insufficient documentation

## 2023-02-26 DIAGNOSIS — Z86711 Personal history of pulmonary embolism: Secondary | ICD-10-CM | POA: Insufficient documentation

## 2023-02-26 DIAGNOSIS — Z5111 Encounter for antineoplastic chemotherapy: Secondary | ICD-10-CM | POA: Insufficient documentation

## 2023-02-26 DIAGNOSIS — D709 Neutropenia, unspecified: Secondary | ICD-10-CM | POA: Insufficient documentation

## 2023-02-26 DIAGNOSIS — Z5112 Encounter for antineoplastic immunotherapy: Secondary | ICD-10-CM | POA: Diagnosis present

## 2023-02-26 DIAGNOSIS — Z7901 Long term (current) use of anticoagulants: Secondary | ICD-10-CM | POA: Diagnosis not present

## 2023-02-26 DIAGNOSIS — C787 Secondary malignant neoplasm of liver and intrahepatic bile duct: Secondary | ICD-10-CM | POA: Insufficient documentation

## 2023-02-26 DIAGNOSIS — Z86718 Personal history of other venous thrombosis and embolism: Secondary | ICD-10-CM | POA: Diagnosis not present

## 2023-02-26 DIAGNOSIS — Z452 Encounter for adjustment and management of vascular access device: Secondary | ICD-10-CM

## 2023-02-26 LAB — CMP (CANCER CENTER ONLY)
ALT: 9 U/L (ref 0–44)
AST: 15 U/L (ref 15–41)
Albumin: 3.7 g/dL (ref 3.5–5.0)
Alkaline Phosphatase: 95 U/L (ref 38–126)
Anion gap: 10 (ref 5–15)
BUN: 9 mg/dL (ref 6–20)
CO2: 30 mmol/L (ref 22–32)
Calcium: 9.4 mg/dL (ref 8.9–10.3)
Chloride: 104 mmol/L (ref 98–111)
Creatinine: 0.87 mg/dL (ref 0.44–1.00)
GFR, Estimated: >60 mL/min (ref >60)
Glucose, Bld: 113 mg/dL — ABNORMAL HIGH (ref 70–99)
Potassium: 4 mmol/L (ref 3.5–5.1)
Sodium: 144 mmol/L (ref 135–145)
Total Bilirubin: 0.4 mg/dL (ref <1.2)
Total Protein: 6.6 g/dL (ref 6.5–8.1)

## 2023-02-26 LAB — CBC WITH DIFFERENTIAL (CANCER CENTER ONLY)
Abs Immature Granulocytes: 0.01 10*3/uL (ref 0.00–0.07)
Basophils Absolute: 0 10*3/uL (ref 0.0–0.1)
Basophils Relative: 1 %
Eosinophils Absolute: 0 10*3/uL (ref 0.0–0.5)
Eosinophils Relative: 1 %
HCT: 37.8 % (ref 36.0–46.0)
Hemoglobin: 11.9 g/dL — ABNORMAL LOW (ref 12.0–15.0)
Immature Granulocytes: 0 %
Lymphocytes Relative: 56 %
Lymphs Abs: 1.7 10*3/uL (ref 0.7–4.0)
MCH: 28.1 pg (ref 26.0–34.0)
MCHC: 31.5 g/dL (ref 30.0–36.0)
MCV: 89.4 fL (ref 80.0–100.0)
Monocytes Absolute: 0.5 10*3/uL (ref 0.1–1.0)
Monocytes Relative: 16 %
Neutro Abs: 0.8 10*3/uL — ABNORMAL LOW (ref 1.7–7.7)
Neutrophils Relative %: 26 %
Platelet Count: 237 10*3/uL (ref 150–400)
RBC: 4.23 MIL/uL (ref 3.87–5.11)
RDW: 19.8 % — ABNORMAL HIGH (ref 11.5–15.5)
WBC Count: 3.1 10*3/uL — ABNORMAL LOW (ref 4.0–10.5)
nRBC: 0 % (ref 0.0–0.2)

## 2023-02-26 LAB — FERRITIN: Ferritin: 726 ng/mL — ABNORMAL HIGH (ref 11–307)

## 2023-02-26 LAB — IRON AND IRON BINDING CAPACITY (CC-WL,HP ONLY)
Iron: 66 ug/dL (ref 28–170)
Saturation Ratios: 22 % (ref 10.4–31.8)
TIBC: 300 ug/dL (ref 250–450)
UIBC: 234 ug/dL (ref 148–442)

## 2023-02-26 LAB — CEA (IN HOUSE-CHCC): CEA (CHCC-In House): 273.38 ng/mL — ABNORMAL HIGH (ref 0.00–5.00)

## 2023-02-26 MED ORDER — ALTEPLASE 2 MG IJ SOLR
2.0000 mg | Freq: Once | INTRAMUSCULAR | Status: DC
  Filled 2023-02-26: qty 2

## 2023-02-26 MED ORDER — HEPARIN SOD (PORK) LOCK FLUSH 100 UNIT/ML IV SOLN
500.0000 [IU] | Freq: Once | INTRAVENOUS | Status: DC
  Administered 2023-02-26: 500 [IU]

## 2023-02-26 MED ORDER — SODIUM CHLORIDE 0.9% FLUSH
10.0000 mL | Freq: Once | INTRAVENOUS | Status: DC
  Administered 2023-02-26: 10 mL

## 2023-02-26 MED ORDER — ALTEPLASE 2 MG IJ SOLR: 2.0000 mg | Freq: Once | INTRAMUSCULAR | Status: DC | PRN

## 2023-02-26 NOTE — Progress Notes (Signed)
At 0955, blood return noted in  PICC line, site flushes

## 2023-02-26 NOTE — Progress Notes (Signed)
At 0900, Pt's PICC line accessed, site  flushes  well, no pain noted or swelling. No pain noted on flushing. No blood return noted. Cath-Flo adminsitered  per policy. Site marked.

## 2023-02-26 NOTE — Patient Instructions (Signed)
PICC Home Care Guide A peripherally inserted central catheter (PICC) is a form of IV access that allows medicines and IV fluids to be quickly put into the blood and spread throughout the body. The PICC is a long, thin, flexible tube (catheter) that is put into a vein in a person's arm or leg. The catheter ends in a large vein just outside the heart called the superior vena cava (SVC). After the PICC is put in, a chest X-ray may be done to make sure that it is in the right place. A PICC may be placed for different reasons, such as: To give medicines and liquid nutrition. To give IV fluids and blood products. To take blood samples often. If there is trouble placing a peripheral intravenous (PIV) catheter. If cared for properly, a PICC can remain in place for many months. Having a PICC can allow you to go home from the hospital sooner and continue treatment at home. Medicines and PICC care can be managed at home by a family member, caregiver, or home health care team. What are the risks? Generally, having a PICC is safe. However, problems may occur, including: A blood clot (thrombus) forming in or at the end of the PICC. A blood clot forming in a vein (deep vein thrombosis) or traveling to the lung (pulmonary embolism). Inflammation of the vein (phlebitis) in which the PICC is placed. Infection at the insertion site or in the blood. Blood infections from central lines, like PICCs, can be serious and often require a hospital stay. PICC malposition, or PICC movement or poor placement. A break or cut in the PICC. Do not use scissors near the PICC. Nerve or tendon irritation or injury during PICC insertion. How to care for your PICC Please follow the specific guidelines provided by your health care provider. Preventing infection You and any caregivers should wash your hands often with soap and water for at least 20 seconds. Wash hands: Before touching the PICC or the infusion device. Before changing a  bandage (dressing). Do not change the dressing unless you have been taught to do so and have shown you are able to change it safely. Flush the PICC as told. Tell your health care provider right away if the PICC is hard to flush or does not flush. Do not use force to flush the PICC. Use clean and germ-free (sterile) supplies only. Keep the supplies in a dry place. Do not reuse needles, syringes, or any other supplies. Reusing supplies can lead to infection. Keep the PICC dressing dry and secure it with tape if the edges stop sticking to your skin. Check your PICC insertion site every day for signs of infection. Check for: Redness, swelling, or pain. Fluid or blood. Warmth. Pus or a bad smell. Preventing other problems Do not use a syringe that is less than 10 mL to flush the PICC. Do not have your blood pressure checked on the arm in which the PICC is placed. Do not ever pull or tug on the PICC. Keep it secured to your arm with tape or a stretch wrap when not in use. Do not take the PICC out yourself. Only a trained health care provider should remove the PICC. Keep pets and children away from your PICC. How to care for your PICC dressing Keep your PICC dressing clean and dry to prevent infection. Do not take baths, swim, or use a hot tub until your health care provider approves. Ask your health care provider if you can take  showers. You may only be allowed to take sponge baths. When you are allowed to shower: Ask your health care provider to teach you how to wrap the PICC. Cover the PICC with clear plastic wrap and tape to keep it dry while showering. Follow instructions from your health care provider about how to take care of your insertion site and dressing. Make sure you: Wash your hands with soap and water for at least 20 seconds before and after you change your dressing. If soap and water are not available, use hand sanitizer. Change your dressing only if taught to do so by your health care  provider. Your PICC dressing needs to be changed if it becomes loose or wet. Leave stitches (sutures), skin glue, or adhesive strips in place. These skin closures may need to stay in place for 2 weeks or longer. If adhesive strip edges start to loosen and curl up, you may trim the loose edges. Do not remove adhesive strips completely unless your health care provider tells you to do that. Follow these instructions at home: Disposal of supplies Throw away any syringes in a disposal container that is meant for sharp items (sharps container). You can buy a sharps container from a pharmacy, or you can make one by using an empty, hard plastic bottle with a lid. Place any used dressings or infusion bags into a plastic bag. Throw that bag in the trash. General instructions  Always carry your PICC identification card or wear a medical alert bracelet. Keep the tube clamped at all times, unless it is being used. Always carry a smooth-edge clamp with you to clamp the PICC if it breaks. Do not use scissors or sharp objects near the tube. You may bend your arm and move it freely. If your PICC is near or at the bend of your elbow, avoid activity with repeated motion at the elbow. Avoid lifting heavy objects as told by your health care provider. Keep all follow-up visits. This is important. You will need to have your PICC dressing changed at least once a week. Contact a health care provider if: You have pain in your arm, ear, face, or teeth. You have a fever or chills. You have redness, swelling, or pain around the insertion site. You have fluid or blood coming from the insertion site. Your insertion site feels warm to the touch. You have pus or a bad smell coming from the insertion site. Your skin feels hard and raised around the insertion site. Your PICC dressing has gotten wet or is coming off and you have not been taught how to change it. Get help right away if: You have problems with your PICC, such as  your PICC: Was tugged or pulled and has partially come out. Do not  push the PICC back in. Cannot be flushed, is hard to flush, or leaks around the insertion site when it is flushed. Makes a flushing sound when it is flushed. Appears to have a hole or tear. Is accidentally pulled all the way out. If this happens, cover the insertion site with a gauze dressing. Do not throw the PICC away. Your health care provider will need to check it to be sure the entire catheter came out. You feel your heart racing or skipping beats, or you have chest pain. You have shortness of breath or trouble breathing. You have swelling, redness, warmth, or pain in the arm in which the PICC is placed. You have a red streak going up your arm that  starts under the PICC dressing. These symptoms may be an emergency. Get help right away. Call 911. Do not wait to see if the symptoms will go away. Do not drive yourself to the hospital. Summary A peripherally inserted central catheter (PICC) is a long, thin, flexible tube (catheter) that is put into a vein in the arm or leg. If cared for properly, a PICC can remain in place for many months. Having a PICC can allow you to go home from the hospital sooner and continue treatment at home. The PICC is inserted using a germ-free (sterile) technique by a specially trained health care provider. Only a trained health care provider should remove it. Do not have your blood pressure checked on the arm in which your PICC is placed. Always keep your PICC identification card with you. This information is not intended to replace advice given to you by your health care provider. Make sure you discuss any questions you have with your health care provider. Document Revised: 10/27/2020 Document Reviewed: 10/27/2020 Elsevier Patient Education  2024 [employer].

## 2023-02-26 NOTE — Progress Notes (Unsigned)
Labs reviewed by MD, " no treatment "

## 2023-02-26 NOTE — Addendum Note (Signed)
Addended by: Arlan Organ R on: 02/26/2023 10:25 AM   Modules accepted: Orders

## 2023-02-26 NOTE — Progress Notes (Signed)
Hematology and Oncology Follow Up Visit  Lauren Mcpherson [id number] 1966-03-03 57 y.o. 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   She reports that she is not feeling badly but is tired. She is a bit concerned that treatment today may make this worse as she typically has fatigue for a few days post treatment.  She was to be seen by [facility] but this visit was cancelled due to lack of imaging per patient.  Neuropathy of feet and hands is stable No fever, chills, n/v, cough, rash, dizziness, SOB, chest pain, palpitations, abdominal pain or changes in bowel or bladder habits.  No swelling in her extremities.  No falls or syncope reported.  Appetite and hydration are good.  Wt Readings from Last 3 Encounters:  02/26/23 260 lb 0.6 oz (118 kg)  01/29/23 262 lb 6.4 oz (119 kg)  01/12/23 258 lb (117 kg)   ECOG Performance Status: 1 - Symptomatic but completely ambulatory  Medications:  Allergies as of 02/26/2023   No Known Allergies      Medication List        Accurate as of February 26, 2023  9:48 AM. If you have any questions, ask your nurse or doctor.          dexamethasone 4 MG tablet Commonly known as: DECADRON Take 2 tablets (8 mg total) by mouth daily. Start the day after chemotherapy for 2 days. Take with food.   ondansetron 8 MG tablet Commonly known  as: Zofran Take 1 tablet (8 mg total) by mouth every 8 (eight) hours as needed for nausea or vomiting. Start on the third day after chemotherapy.   OVER THE COUNTER MEDICATION daily. Nervive - one capsule daily.   prochlorperazine 10 MG tablet Commonly known as: COMPAZINE TAKE 1 TABLET(10 MG) BY MOUTH EVERY 6 HOURS AS NEEDED FOR NAUSEA OR VOMITING   rivaroxaban 10 MG Tabs tablet Commonly known as: XARELTO Take 1 tablet (10 mg total) by mouth daily with supper.        Allergies: No Known Allergies  Past Medical History, Surgical history, Social history, and Family History were reviewed and updated.  Review of Systems: All other 10 point review of systems is negative.   Physical Exam:  height is 5\' 4"  (1.626 m) and weight is 260 lb 0.6 oz (118 kg). Her oral temperature is 98.3 F (36.8 C). Her blood pressure is 106/74 and her pulse is 78. Her respiration is 18 and oxygen saturation is 100%.   Wt Readings from Last 3 Encounters:  02/26/23 260 lb 0.6 oz (118 kg)  01/29/23 262 lb 6.4 oz (119 kg)  01/12/23 258 lb (117 kg)    Ocular: Sclerae unicteric, pupils equal, round and reactive to light Ear-nose-throat: Oropharynx clear, dentition fair Lymphatic: No cervical or supraclavicular adenopathy Lungs no rales or rhonchi, good  excursion bilaterally Heart regular rate and rhythm, no murmur appreciated Abd soft, nontender, positive bowel sounds MSK no focal spinal tenderness, no joint edema Neuro: non-focal, well-oriented, appropriate affect   Lab Results  Component Value Date   WBC 4.9 02/12/2023   HGB 11.8 (L) 02/12/2023   HCT 36.8 02/12/2023   MCV 88.2 02/12/2023   PLT 166 02/12/2023   Lab Results  Component Value Date   FERRITIN 312 (H) 12/28/2021   IRON 57 12/28/2021   TIBC 230 (L) 12/28/2021   UIBC 173 12/28/2021   IRONPCTSAT 25 12/28/2021   Lab Results  Component Value Date   RETICCTPCT 1.5 12/14/2021   RBC 4.17 02/12/2023   No results found for:  "KPAFRELGTCHN", "LAMBDASER", "KAPLAMBRATIO" No results found for: "IGGSERUM", "IGA", "IGMSERUM" No results found for: "TOTALPROTELP", "ALBUMINELP", "A1GS", "A2GS", "BETS", "BETA2SER", "GAMS", "MSPIKE", "SPEI"   Chemistry      Component Value Date/Time   NA 144 02/26/2023 0841   K 4.0 02/26/2023 0841   CL 104 02/26/2023 0841   CO2 30 02/26/2023 0841   BUN 9 02/26/2023 0841   CREATININE 0.87 02/26/2023 0841      Component Value Date/Time   CALCIUM 9.4 02/26/2023 0841   ALKPHOS 95 02/26/2023 0841   AST 15 02/26/2023 0841   ALT 9 02/26/2023 0841   BILITOT 0.4 02/26/2023 0841       Impression and Plan: Lauren Mcpherson is a very pleasant 57 yo African American female with metastatic 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   She is here for consideration of cycle 5 of FOLFOX/Avastin.  CBC shows worse neutropenia. She is fatigued. CMP is stable. HOLDING treatment today.  Discussed precautions.  PET scan/MR liver  has been ordered but not yet scheduled.    Continue PICC flush 3 days a week, MWF.  RTC 1 weeks MD, PICC labs, Cycle 5 FOLFOX/Avastin  Rushie Chestnut, PA-C 11/4/20249:48 AM

## 2023-02-27 ENCOUNTER — Other Ambulatory Visit: Payer: Self-pay | Admitting: Pharmacist

## 2023-02-27 ENCOUNTER — Encounter: Payer: Self-pay | Admitting: Hematology & Oncology

## 2023-02-27 NOTE — Addendum Note (Signed)
Addended by: Neita Goodnight on: 02/27/2023 08:50 AM   Modules accepted: Orders

## 2023-02-28 ENCOUNTER — Inpatient Hospital Stay: Payer: No Typology Code available for payment source

## 2023-02-28 VITALS — BP 119/66 | HR 118 | Temp 97.5°F | Resp 18

## 2023-02-28 DIAGNOSIS — K56609 Unspecified intestinal obstruction, unspecified as to partial versus complete obstruction: Secondary | ICD-10-CM

## 2023-02-28 DIAGNOSIS — Z5112 Encounter for antineoplastic immunotherapy: Secondary | ICD-10-CM | POA: Diagnosis not present

## 2023-02-28 DIAGNOSIS — C180 Malignant neoplasm of cecum: Secondary | ICD-10-CM

## 2023-02-28 MED ORDER — SODIUM CHLORIDE 0.9% FLUSH
10.0000 mL | Freq: Once | INTRAVENOUS | Status: AC
  Administered 2023-02-28: 10 mL via INTRAVENOUS

## 2023-02-28 MED ORDER — HEPARIN SOD (PORK) LOCK FLUSH 100 UNIT/ML IV SOLN
500.0000 [IU] | Freq: Once | INTRAVENOUS | Status: AC
  Administered 2023-02-28: 500 [IU] via INTRAVENOUS

## 2023-02-28 NOTE — Patient Instructions (Signed)
PICC Home Care Guide A peripherally inserted central catheter (PICC) is a form of IV access that allows medicines and IV fluids to be quickly put into the blood and spread throughout the body. The PICC is a long, thin, flexible tube (catheter) that is put into a vein in a person's arm or leg. The catheter ends in a large vein just outside the heart called the superior vena cava (SVC). After the PICC is put in, a chest X-ray may be done to make sure that it is in the right place. A PICC may be placed for different reasons, such as: To give medicines and liquid nutrition. To give IV fluids and blood products. To take blood samples often. If there is trouble placing a peripheral intravenous (PIV) catheter. If cared for properly, a PICC can remain in place for many months. Having a PICC can allow you to go home from the hospital sooner and continue treatment at home. Medicines and PICC care can be managed at home by a family member, caregiver, or home health care team. What are the risks? Generally, having a PICC is safe. However, problems may occur, including: A blood clot (thrombus) forming in or at the end of the PICC. A blood clot forming in a vein (deep vein thrombosis) or traveling to the lung (pulmonary embolism). Inflammation of the vein (phlebitis) in which the PICC is placed. Infection at the insertion site or in the blood. Blood infections from central lines, like PICCs, can be serious and often require a hospital stay. PICC malposition, or PICC movement or poor placement. A break or cut in the PICC. Do not use scissors near the PICC. Nerve or tendon irritation or injury during PICC insertion. How to care for your PICC Please follow the specific guidelines provided by your health care provider. Preventing infection You and any caregivers should wash your hands often with soap and water for at least 20 seconds. Wash hands: Before touching the PICC or the infusion device. Before changing a  bandage (dressing). Do not change the dressing unless you have been taught to do so and have shown you are able to change it safely. Flush the PICC as told. Tell your health care provider right away if the PICC is hard to flush or does not flush. Do not use force to flush the PICC. Use clean and germ-free (sterile) supplies only. Keep the supplies in a dry place. Do not reuse needles, syringes, or any other supplies. Reusing supplies can lead to infection. Keep the PICC dressing dry and secure it with tape if the edges stop sticking to your skin. Check your PICC insertion site every day for signs of infection. Check for: Redness, swelling, or pain. Fluid or blood. Warmth. Pus or a bad smell. Preventing other problems Do not use a syringe that is less than 10 mL to flush the PICC. Do not have your blood pressure checked on the arm in which the PICC is placed. Do not ever pull or tug on the PICC. Keep it secured to your arm with tape or a stretch wrap when not in use. Do not take the PICC out yourself. Only a trained health care provider should remove the PICC. Keep pets and children away from your PICC. How to care for your PICC dressing Keep your PICC dressing clean and dry to prevent infection. Do not take baths, swim, or use a hot tub until your health care provider approves. Ask your health care provider if you can take  showers. You may only be allowed to take sponge baths. When you are allowed to shower: Ask your health care provider to teach you how to wrap the PICC. Cover the PICC with clear plastic wrap and tape to keep it dry while showering. Follow instructions from your health care provider about how to take care of your insertion site and dressing. Make sure you: Wash your hands with soap and water for at least 20 seconds before and after you change your dressing. If soap and water are not available, use hand sanitizer. Change your dressing only if taught to do so by your health care  provider. Your PICC dressing needs to be changed if it becomes loose or wet. Leave stitches (sutures), skin glue, or adhesive strips in place. These skin closures may need to stay in place for 2 weeks or longer. If adhesive strip edges start to loosen and curl up, you may trim the loose edges. Do not remove adhesive strips completely unless your health care provider tells you to do that. Follow these instructions at home: Disposal of supplies Throw away any syringes in a disposal container that is meant for sharp items (sharps container). You can buy a sharps container from a pharmacy, or you can make one by using an empty, hard plastic bottle with a lid. Place any used dressings or infusion bags into a plastic bag. Throw that bag in the trash. General instructions  Always carry your PICC identification card or wear a medical alert bracelet. Keep the tube clamped at all times, unless it is being used. Always carry a smooth-edge clamp with you to clamp the PICC if it breaks. Do not use scissors or sharp objects near the tube. You may bend your arm and move it freely. If your PICC is near or at the bend of your elbow, avoid activity with repeated motion at the elbow. Avoid lifting heavy objects as told by your health care provider. Keep all follow-up visits. This is important. You will need to have your PICC dressing changed at least once a week. Contact a health care provider if: You have pain in your arm, ear, face, or teeth. You have a fever or chills. You have redness, swelling, or pain around the insertion site. You have fluid or blood coming from the insertion site. Your insertion site feels warm to the touch. You have pus or a bad smell coming from the insertion site. Your skin feels hard and raised around the insertion site. Your PICC dressing has gotten wet or is coming off and you have not been taught how to change it. Get help right away if: You have problems with your PICC, such as  your PICC: Was tugged or pulled and has partially come out. Do not  push the PICC back in. Cannot be flushed, is hard to flush, or leaks around the insertion site when it is flushed. Makes a flushing sound when it is flushed. Appears to have a hole or tear. Is accidentally pulled all the way out. If this happens, cover the insertion site with a gauze dressing. Do not throw the PICC away. Your health care provider will need to check it to be sure the entire catheter came out. You feel your heart racing or skipping beats, or you have chest pain. You have shortness of breath or trouble breathing. You have swelling, redness, warmth, or pain in the arm in which the PICC is placed. You have a red streak going up your arm that  starts under the PICC dressing. These symptoms may be an emergency. Get help right away. Call 911. Do not wait to see if the symptoms will go away. Do not drive yourself to the hospital. Summary A peripherally inserted central catheter (PICC) is a long, thin, flexible tube (catheter) that is put into a vein in the arm or leg. If cared for properly, a PICC can remain in place for many months. Having a PICC can allow you to go home from the hospital sooner and continue treatment at home. The PICC is inserted using a germ-free (sterile) technique by a specially trained health care provider. Only a trained health care provider should remove it. Do not have your blood pressure checked on the arm in which your PICC is placed. Always keep your PICC identification card with you. This information is not intended to replace advice given to you by your health care provider. Make sure you discuss any questions you have with your health care provider. Document Revised: 10/27/2020 Document Reviewed: 10/27/2020 Elsevier Patient Education  2024 [employer].

## 2023-03-02 ENCOUNTER — Inpatient Hospital Stay: Payer: No Typology Code available for payment source

## 2023-03-02 VITALS — BP 110/88 | HR 95 | Temp 98.4°F | Resp 20

## 2023-03-02 DIAGNOSIS — C180 Malignant neoplasm of cecum: Secondary | ICD-10-CM

## 2023-03-02 DIAGNOSIS — Z5112 Encounter for antineoplastic immunotherapy: Secondary | ICD-10-CM | POA: Diagnosis not present

## 2023-03-02 MED ORDER — HEPARIN SOD (PORK) LOCK FLUSH 100 UNIT/ML IV SOLN: 250.0000 [IU] | Freq: Once | INTRAVENOUS | Status: AC

## 2023-03-02 MED ORDER — HEPARIN SOD (PORK) LOCK FLUSH 100 UNIT/ML IV SOLN
250.0000 [IU] | Freq: Once | INTRAVENOUS | Status: AC
  Administered 2023-03-02: 250 [IU]

## 2023-03-02 NOTE — Patient Instructions (Signed)
PICC Home Care Guide A peripherally inserted central catheter (PICC) is a form of IV access that allows medicines and IV fluids to be quickly put into the blood and spread throughout the body. The PICC is a long, thin, flexible tube (catheter) that is put into a vein in a person's arm or leg. The catheter ends in a large vein just outside the heart called the superior vena cava (SVC). After the PICC is put in, a chest X-ray may be done to make sure that it is in the right place. A PICC may be placed for different reasons, such as: To give medicines and liquid nutrition. To give IV fluids and blood products. To take blood samples often. If there is trouble placing a peripheral intravenous (PIV) catheter. If cared for properly, a PICC can remain in place for many months. Having a PICC can allow you to go home from the hospital sooner and continue treatment at home. Medicines and PICC care can be managed at home by a family member, caregiver, or home health care team. What are the risks? Generally, having a PICC is safe. However, problems may occur, including: A blood clot (thrombus) forming in or at the end of the PICC. A blood clot forming in a vein (deep vein thrombosis) or traveling to the lung (pulmonary embolism). Inflammation of the vein (phlebitis) in which the PICC is placed. Infection at the insertion site or in the blood. Blood infections from central lines, like PICCs, can be serious and often require a hospital stay. PICC malposition, or PICC movement or poor placement. A break or cut in the PICC. Do not use scissors near the PICC. Nerve or tendon irritation or injury during PICC insertion. How to care for your PICC Please follow the specific guidelines provided by your health care provider. Preventing infection You and any caregivers should wash your hands often with soap and water for at least 20 seconds. Wash hands: Before touching the PICC or the infusion device. Before changing a  bandage (dressing). Do not change the dressing unless you have been taught to do so and have shown you are able to change it safely. Flush the PICC as told. Tell your health care provider right away if the PICC is hard to flush or does not flush. Do not use force to flush the PICC. Use clean and germ-free (sterile) supplies only. Keep the supplies in a dry place. Do not reuse needles, syringes, or any other supplies. Reusing supplies can lead to infection. Keep the PICC dressing dry and secure it with tape if the edges stop sticking to your skin. Check your PICC insertion site every day for signs of infection. Check for: Redness, swelling, or pain. Fluid or blood. Warmth. Pus or a bad smell. Preventing other problems Do not use a syringe that is less than 10 mL to flush the PICC. Do not have your blood pressure checked on the arm in which the PICC is placed. Do not ever pull or tug on the PICC. Keep it secured to your arm with tape or a stretch wrap when not in use. Do not take the PICC out yourself. Only a trained health care provider should remove the PICC. Keep pets and children away from your PICC. How to care for your PICC dressing Keep your PICC dressing clean and dry to prevent infection. Do not take baths, swim, or use a hot tub until your health care provider approves. Ask your health care provider if you can take  showers. You may only be allowed to take sponge baths. When you are allowed to shower: Ask your health care provider to teach you how to wrap the PICC. Cover the PICC with clear plastic wrap and tape to keep it dry while showering. Follow instructions from your health care provider about how to take care of your insertion site and dressing. Make sure you: Wash your hands with soap and water for at least 20 seconds before and after you change your dressing. If soap and water are not available, use hand sanitizer. Change your dressing only if taught to do so by your health care  provider. Your PICC dressing needs to be changed if it becomes loose or wet. Leave stitches (sutures), skin glue, or adhesive strips in place. These skin closures may need to stay in place for 2 weeks or longer. If adhesive strip edges start to loosen and curl up, you may trim the loose edges. Do not remove adhesive strips completely unless your health care provider tells you to do that. Follow these instructions at home: Disposal of supplies Throw away any syringes in a disposal container that is meant for sharp items (sharps container). You can buy a sharps container from a pharmacy, or you can make one by using an empty, hard plastic bottle with a lid. Place any used dressings or infusion bags into a plastic bag. Throw that bag in the trash. General instructions  Always carry your PICC identification card or wear a medical alert bracelet. Keep the tube clamped at all times, unless it is being used. Always carry a smooth-edge clamp with you to clamp the PICC if it breaks. Do not use scissors or sharp objects near the tube. You may bend your arm and move it freely. If your PICC is near or at the bend of your elbow, avoid activity with repeated motion at the elbow. Avoid lifting heavy objects as told by your health care provider. Keep all follow-up visits. This is important. You will need to have your PICC dressing changed at least once a week. Contact a health care provider if: You have pain in your arm, ear, face, or teeth. You have a fever or chills. You have redness, swelling, or pain around the insertion site. You have fluid or blood coming from the insertion site. Your insertion site feels warm to the touch. You have pus or a bad smell coming from the insertion site. Your skin feels hard and raised around the insertion site. Your PICC dressing has gotten wet or is coming off and you have not been taught how to change it. Get help right away if: You have problems with your PICC, such as  your PICC: Was tugged or pulled and has partially come out. Do not  push the PICC back in. Cannot be flushed, is hard to flush, or leaks around the insertion site when it is flushed. Makes a flushing sound when it is flushed. Appears to have a hole or tear. Is accidentally pulled all the way out. If this happens, cover the insertion site with a gauze dressing. Do not throw the PICC away. Your health care provider will need to check it to be sure the entire catheter came out. You feel your heart racing or skipping beats, or you have chest pain. You have shortness of breath or trouble breathing. You have swelling, redness, warmth, or pain in the arm in which the PICC is placed. You have a red streak going up your arm that  starts under the PICC dressing. These symptoms may be an emergency. Get help right away. Call 911. Do not wait to see if the symptoms will go away. Do not drive yourself to the hospital. Summary A peripherally inserted central catheter (PICC) is a long, thin, flexible tube (catheter) that is put into a vein in the arm or leg. If cared for properly, a PICC can remain in place for many months. Having a PICC can allow you to go home from the hospital sooner and continue treatment at home. The PICC is inserted using a germ-free (sterile) technique by a specially trained health care provider. Only a trained health care provider should remove it. Do not have your blood pressure checked on the arm in which your PICC is placed. Always keep your PICC identification card with you. This information is not intended to replace advice given to you by your health care provider. Make sure you discuss any questions you have with your health care provider. Document Revised: 10/27/2020 Document Reviewed: 10/27/2020 Elsevier Patient Education  2024 [employer].

## 2023-03-05 ENCOUNTER — Inpatient Hospital Stay: Payer: No Typology Code available for payment source

## 2023-03-05 ENCOUNTER — Other Ambulatory Visit: Payer: Self-pay

## 2023-03-05 ENCOUNTER — Inpatient Hospital Stay: Payer: Medicaid Other

## 2023-03-05 ENCOUNTER — Encounter: Payer: Self-pay | Admitting: Medical Oncology

## 2023-03-05 ENCOUNTER — Inpatient Hospital Stay (HOSPITAL_BASED_OUTPATIENT_CLINIC_OR_DEPARTMENT_OTHER): Payer: No Typology Code available for payment source | Admitting: Medical Oncology

## 2023-03-05 VITALS — BP 114/74 | HR 79 | Temp 98.2°F | Resp 18 | Ht 64.0 in | Wt 266.0 lb

## 2023-03-05 DIAGNOSIS — C180 Malignant neoplasm of cecum: Secondary | ICD-10-CM

## 2023-03-05 DIAGNOSIS — C787 Secondary malignant neoplasm of liver and intrahepatic bile duct: Secondary | ICD-10-CM | POA: Diagnosis not present

## 2023-03-05 DIAGNOSIS — Z5112 Encounter for antineoplastic immunotherapy: Secondary | ICD-10-CM | POA: Diagnosis not present

## 2023-03-05 LAB — CMP (CANCER CENTER ONLY)
ALT: 8 U/L (ref 0–44)
AST: 14 U/L — ABNORMAL LOW (ref 15–41)
Albumin: 3.8 g/dL (ref 3.5–5.0)
Alkaline Phosphatase: 101 U/L (ref 38–126)
Anion gap: 7 (ref 5–15)
BUN: 8 mg/dL (ref 6–20)
CO2: 31 mmol/L (ref 22–32)
Calcium: 9.4 mg/dL (ref 8.9–10.3)
Chloride: 104 mmol/L (ref 98–111)
Creatinine: 0.82 mg/dL (ref 0.44–1.00)
GFR, Estimated: >60 mL/min (ref >60)
Glucose, Bld: 102 mg/dL — ABNORMAL HIGH (ref 70–99)
Potassium: 4.4 mmol/L (ref 3.5–5.1)
Sodium: 142 mmol/L (ref 135–145)
Total Bilirubin: 0.4 mg/dL (ref <1.2)
Total Protein: 6.5 g/dL (ref 6.5–8.1)

## 2023-03-05 LAB — CBC WITH DIFFERENTIAL (CANCER CENTER ONLY)
Abs Immature Granulocytes: 0.03 10*3/uL (ref 0.00–0.07)
Basophils Absolute: 0.1 10*3/uL (ref 0.0–0.1)
Basophils Relative: 1 %
Eosinophils Absolute: 0.1 10*3/uL (ref 0.0–0.5)
Eosinophils Relative: 2 %
HCT: 36.6 % (ref 36.0–46.0)
Hemoglobin: 11.7 g/dL — ABNORMAL LOW (ref 12.0–15.0)
Immature Granulocytes: 1 %
Lymphocytes Relative: 34 %
Lymphs Abs: 2.2 10*3/uL (ref 0.7–4.0)
MCH: 29 pg (ref 26.0–34.0)
MCHC: 32 g/dL (ref 30.0–36.0)
MCV: 90.8 fL (ref 80.0–100.0)
Monocytes Absolute: 1.1 10*3/uL — ABNORMAL HIGH (ref 0.1–1.0)
Monocytes Relative: 17 %
Neutro Abs: 3 10*3/uL (ref 1.7–7.7)
Neutrophils Relative %: 45 %
Platelet Count: 371 10*3/uL (ref 150–400)
RBC: 4.03 MIL/uL (ref 3.87–5.11)
RDW: 20.9 % — ABNORMAL HIGH (ref 11.5–15.5)
WBC Count: 6.5 10*3/uL (ref 4.0–10.5)
nRBC: 0 % (ref 0.0–0.2)

## 2023-03-05 MED ORDER — SODIUM CHLORIDE 0.9 % IV SOLN
5.0000 mg/kg | Freq: Once | INTRAVENOUS | Status: AC
  Administered 2023-03-05: 600 mg via INTRAVENOUS
  Filled 2023-03-05: qty 16

## 2023-03-05 MED ORDER — HEPARIN SOD (PORK) LOCK FLUSH 100 UNIT/ML IV SOLN: 500.0000 [IU] | Freq: Once | INTRAVENOUS | Status: DC | PRN

## 2023-03-05 MED ORDER — LEUCOVORIN CALCIUM INJECTION 350 MG
400.0000 mg/m2 | Freq: Once | INTRAVENOUS | Status: AC
  Administered 2023-03-05: 928 mg via INTRAVENOUS
  Filled 2023-03-05: qty 46.4

## 2023-03-05 MED ORDER — FLUOROURACIL CHEMO INJECTION 2.5 GM/50ML
400.0000 mg/m2 | Freq: Once | INTRAVENOUS | Status: AC
  Administered 2023-03-05: 1000 mg via INTRAVENOUS
  Filled 2023-03-05: qty 20

## 2023-03-05 MED ORDER — SODIUM CHLORIDE 0.9 % IV SOLN: Freq: Once | INTRAVENOUS | Status: AC

## 2023-03-05 MED ORDER — PALONOSETRON HCL INJECTION 0.25 MG/5ML
0.2500 mg | Freq: Once | INTRAVENOUS | Status: AC
Start: 2023-03-05 — End: 2023-03-05
  Administered 2023-03-05: 0.25 mg via INTRAVENOUS
  Filled 2023-03-05: qty 5

## 2023-03-05 MED ORDER — DEXAMETHASONE SODIUM PHOSPHATE 10 MG/ML IJ SOLN
10.0000 mg | Freq: Once | INTRAMUSCULAR | Status: AC
Start: 2023-03-05 — End: 2023-03-05
  Administered 2023-03-05: 10 mg via INTRAVENOUS
  Filled 2023-03-05: qty 1

## 2023-03-05 MED ORDER — ALTEPLASE 2 MG IJ SOLR
2.0000 mg | Freq: Once | INTRAMUSCULAR | Status: AC
  Administered 2023-03-05: 2 mg
  Filled 2023-03-05: qty 2

## 2023-03-05 MED ORDER — SODIUM CHLORIDE 0.9% FLUSH
10.0000 mL | INTRAVENOUS | Status: DC | PRN
Start: 2023-03-05 — End: 2023-03-05

## 2023-03-05 MED ORDER — OXALIPLATIN CHEMO INJECTION 100 MG/20ML
85.0000 mg/m2 | Freq: Once | INTRAVENOUS | Status: AC
  Administered 2023-03-05: 200 mg via INTRAVENOUS
  Filled 2023-03-05: qty 40

## 2023-03-05 MED ORDER — FLUOROURACIL CHEMO INJECTION 5 GM/100ML
2400.0000 mg/m2 | INTRAVENOUS | Status: DC
  Administered 2023-03-05: 5550 mg via INTRAVENOUS
  Filled 2023-03-05: qty 111

## 2023-03-05 MED ORDER — DEXTROSE 5 % IV SOLN: Freq: Once | INTRAVENOUS | Status: AC

## 2023-03-05 NOTE — Progress Notes (Signed)
Blood return noted in [facility] line noted.

## 2023-03-05 NOTE — Progress Notes (Signed)
PET/MRI ordered per Dr.Ennever. patient has to go to [facility] imaging due to insurance, waiting on [place] and will fax orders to [phone]. Pt provided with number to call and schedule.

## 2023-03-05 NOTE — Patient Instructions (Signed)
PICC Home Care Guide A peripherally inserted central catheter (PICC) is a form of IV access that allows medicines and IV fluids to be quickly put into the blood and spread throughout the body. The PICC is a long, thin, flexible tube (catheter) that is put into a vein in a person's arm or leg. The catheter ends in a large vein just outside the heart called the superior vena cava (SVC). After the PICC is put in, a chest X-ray may be done to make sure that it is in the right place. A PICC may be placed for different reasons, such as: To give medicines and liquid nutrition. To give IV fluids and blood products. To take blood samples often. If there is trouble placing a peripheral intravenous (PIV) catheter. If cared for properly, a PICC can remain in place for many months. Having a PICC can allow you to go home from the hospital sooner and continue treatment at home. Medicines and PICC care can be managed at home by a family member, caregiver, or home health care team. What are the risks? Generally, having a PICC is safe. However, problems may occur, including: A blood clot (thrombus) forming in or at the end of the PICC. A blood clot forming in a vein (deep vein thrombosis) or traveling to the lung (pulmonary embolism). Inflammation of the vein (phlebitis) in which the PICC is placed. Infection at the insertion site or in the blood. Blood infections from central lines, like PICCs, can be serious and often require a hospital stay. PICC malposition, or PICC movement or poor placement. A break or cut in the PICC. Do not use scissors near the PICC. Nerve or tendon irritation or injury during PICC insertion. How to care for your PICC Please follow the specific guidelines provided by your health care provider. Preventing infection You and any caregivers should wash your hands often with soap and water for at least 20 seconds. Wash hands: Before touching the PICC or the infusion device. Before changing a  bandage (dressing). Do not change the dressing unless you have been taught to do so and have shown you are able to change it safely. Flush the PICC as told. Tell your health care provider right away if the PICC is hard to flush or does not flush. Do not use force to flush the PICC. Use clean and germ-free (sterile) supplies only. Keep the supplies in a dry place. Do not reuse needles, syringes, or any other supplies. Reusing supplies can lead to infection. Keep the PICC dressing dry and secure it with tape if the edges stop sticking to your skin. Check your PICC insertion site every day for signs of infection. Check for: Redness, swelling, or pain. Fluid or blood. Warmth. Pus or a bad smell. Preventing other problems Do not use a syringe that is less than 10 mL to flush the PICC. Do not have your blood pressure checked on the arm in which the PICC is placed. Do not ever pull or tug on the PICC. Keep it secured to your arm with tape or a stretch wrap when not in use. Do not take the PICC out yourself. Only a trained health care provider should remove the PICC. Keep pets and children away from your PICC. How to care for your PICC dressing Keep your PICC dressing clean and dry to prevent infection. Do not take baths, swim, or use a hot tub until your health care provider approves. Ask your health care provider if you can take  showers. You may only be allowed to take sponge baths. When you are allowed to shower: Ask your health care provider to teach you how to wrap the PICC. Cover the PICC with clear plastic wrap and tape to keep it dry while showering. Follow instructions from your health care provider about how to take care of your insertion site and dressing. Make sure you: Wash your hands with soap and water for at least 20 seconds before and after you change your dressing. If soap and water are not available, use hand sanitizer. Change your dressing only if taught to do so by your health care  provider. Your PICC dressing needs to be changed if it becomes loose or wet. Leave stitches (sutures), skin glue, or adhesive strips in place. These skin closures may need to stay in place for 2 weeks or longer. If adhesive strip edges start to loosen and curl up, you may trim the loose edges. Do not remove adhesive strips completely unless your health care provider tells you to do that. Follow these instructions at home: Disposal of supplies Throw away any syringes in a disposal container that is meant for sharp items (sharps container). You can buy a sharps container from a pharmacy, or you can make one by using an empty, hard plastic bottle with a lid. Place any used dressings or infusion bags into a plastic bag. Throw that bag in the trash. General instructions  Always carry your PICC identification card or wear a medical alert bracelet. Keep the tube clamped at all times, unless it is being used. Always carry a smooth-edge clamp with you to clamp the PICC if it breaks. Do not use scissors or sharp objects near the tube. You may bend your arm and move it freely. If your PICC is near or at the bend of your elbow, avoid activity with repeated motion at the elbow. Avoid lifting heavy objects as told by your health care provider. Keep all follow-up visits. This is important. You will need to have your PICC dressing changed at least once a week. Contact a health care provider if: You have pain in your arm, ear, face, or teeth. You have a fever or chills. You have redness, swelling, or pain around the insertion site. You have fluid or blood coming from the insertion site. Your insertion site feels warm to the touch. You have pus or a bad smell coming from the insertion site. Your skin feels hard and raised around the insertion site. Your PICC dressing has gotten wet or is coming off and you have not been taught how to change it. Get help right away if: You have problems with your PICC, such as  your PICC: Was tugged or pulled and has partially come out. Do not  push the PICC back in. Cannot be flushed, is hard to flush, or leaks around the insertion site when it is flushed. Makes a flushing sound when it is flushed. Appears to have a hole or tear. Is accidentally pulled all the way out. If this happens, cover the insertion site with a gauze dressing. Do not throw the PICC away. Your health care provider will need to check it to be sure the entire catheter came out. You feel your heart racing or skipping beats, or you have chest pain. You have shortness of breath or trouble breathing. You have swelling, redness, warmth, or pain in the arm in which the PICC is placed. You have a red streak going up your arm that  starts under the PICC dressing. These symptoms may be an emergency. Get help right away. Call 911. Do not wait to see if the symptoms will go away. Do not drive yourself to the hospital. Summary A peripherally inserted central catheter (PICC) is a long, thin, flexible tube (catheter) that is put into a vein in the arm or leg. If cared for properly, a PICC can remain in place for many months. Having a PICC can allow you to go home from the hospital sooner and continue treatment at home. The PICC is inserted using a germ-free (sterile) technique by a specially trained health care provider. Only a trained health care provider should remove it. Do not have your blood pressure checked on the arm in which your PICC is placed. Always keep your PICC identification card with you. This information is not intended to replace advice given to you by your health care provider. Make sure you discuss any questions you have with your health care provider. Document Revised: 10/27/2020 Document Reviewed: 10/27/2020 Elsevier Patient Education  2024 [employer].

## 2023-03-05 NOTE — Patient Instructions (Signed)
[place] [facility] - [facility] DEPT OF [facility]  Discharge Instructions: Thank you for choosing [facility] to provide your oncology and hematology care.   If you have a lab appointment with the Cancer Center, please go directly to the Cancer Center and check in at the registration area.  Wear comfortable clothing and clothing appropriate for easy access to any Portacath or PICC line.   We strive to give you quality time with your provider. You may need to reschedule your appointment if you arrive late (15 or more minutes).  Arriving late affects you and other patients whose appointments are after yours.  Also, if you miss three or more appointments without notifying the office, you may be dismissed from the clinic at the provider's discretion.      For prescription refill requests, have your pharmacy contact our office and allow 72 hours for refills to be completed.    Today you received the following chemotherapy and/or immunotherapy agents      To help prevent nausea and vomiting after your treatment, we encourage you to take your nausea medication as directed.  BELOW ARE SYMPTOMS THAT SHOULD BE REPORTED IMMEDIATELY: *FEVER GREATER THAN 100.4 F (38 C) OR HIGHER *CHILLS OR SWEATING *NAUSEA AND VOMITING THAT IS NOT CONTROLLED WITH YOUR NAUSEA MEDICATION *UNUSUAL SHORTNESS OF BREATH *UNUSUAL BRUISING OR BLEEDING *URINARY PROBLEMS (pain or burning when urinating, or frequent urination) *BOWEL PROBLEMS (unusual diarrhea, constipation, pain near the anus) TENDERNESS IN MOUTH AND THROAT WITH OR WITHOUT PRESENCE OF ULCERS (sore throat, sores in mouth, or a toothache) UNUSUAL RASH, SWELLING OR PAIN  UNUSUAL VAGINAL DISCHARGE OR ITCHING   Items with * indicate a potential emergency and should be followed up as soon as possible or go to the Emergency Department if any problems should occur.  Please show the CHEMOTHERAPY ALERT CARD or IMMUNOTHERAPY ALERT CARD  at check-in to the Emergency Department and triage nurse. Should you have questions after your visit or need to cancel or reschedule your appointment, please contact [place] [facility] - [facility] OF [facility]  [phone] and follow the prompts.  Office hours are 8:00 a.m. to 4:30 p.m. Monday - Friday. Please note that voicemails left after 4:00 p.m. may not be returned until the following business day.  We are closed weekends and major holidays. You have access to a nurse at all times for urgent questions. Please call the main number to the clinic [phone] and follow the prompts.  For any non-urgent questions, you may also contact your provider using MyChart. We now offer e-Visits for anyone 36 and older to request care online for non-urgent symptoms. For details visit [email or web address].   Also download the MyChart app! Go to the app store, search "MyChart", open the app, select [place], and log in with your MyChart username and password.

## 2023-03-05 NOTE — Progress Notes (Signed)
Hematology and Oncology Follow Up Visit  Lauren Mcpherson [id number] 03-Jun-1965 57 y.o. 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   At her last visit one week ago treatment was held due to neutropenia and patient request as she was not feeling well overall. Today she reports that she is feeling "much better" and feels strong. She would like treatment today if possible.  Neuropathy of feet and hands is stable and not worsening. She does not want anything for this today No fever, chills, n/v, cough, rash, dizziness, SOB, chest pain, palpitations, abdominal pain or changes in bowel or bladder habits.  No swelling in her extremities.  No falls or syncope reported.  Appetite and hydration are good.  Wt Readings from Last 3 Encounters:  03/05/23 266 lb 0.6 oz (120.7 kg)  02/26/23 260 lb 0.6 oz (118 kg)  01/29/23 262 lb 6.4 oz (119 kg)   ECOG Performance Status: 1 - Symptomatic but completely ambulatory  Medications:  Allergies as of 03/05/2023   No Known Allergies      Medication List        Accurate as of March 05, 2023  9:16 AM. If you have any questions, ask your nurse or doctor.          dexamethasone 4 MG tablet Commonly known as: DECADRON Take 2 tablets (8 mg total) by mouth daily. Start the day after chemotherapy for 2 days. Take with food.    ondansetron 8 MG tablet Commonly known as: Zofran Take 1 tablet (8 mg total) by mouth every 8 (eight) hours as needed for nausea or vomiting. Start on the third day after chemotherapy.   OVER THE COUNTER MEDICATION daily. Nervive - one capsule daily.   prochlorperazine 10 MG tablet Commonly known as: COMPAZINE TAKE 1 TABLET(10 MG) BY MOUTH EVERY 6 HOURS AS NEEDED FOR NAUSEA OR VOMITING   rivaroxaban 10 MG Tabs tablet Commonly known as: XARELTO Take 1 tablet (10 mg total) by mouth daily with supper.        Allergies: No Known Allergies  Past Medical History, Surgical history, Social history, and Family History were reviewed and updated.  Review of Systems: All other 10 point review of systems is negative.   Physical Exam:  height is 5\' 4"  (1.626 m) and weight is 266 lb 0.6 oz (120.7 kg). Her oral temperature is 98.2 F (36.8 C). Her blood pressure is 114/74 and her pulse is 79. Her respiration is 18 and oxygen saturation is 100%.   Wt Readings from Last 3 Encounters:  03/05/23 266 lb 0.6 oz (120.7 kg)  02/26/23 260 lb 0.6 oz (118 kg)  01/29/23 262 lb 6.4 oz (119 kg)    Ocular: Sclerae unicteric, pupils equal, round and reactive to light Ear-nose-throat: Oropharynx clear, dentition fair Lymphatic: No cervical or supraclavicular adenopathy Lungs no rales  or rhonchi, good excursion bilaterally Heart regular rate and rhythm, no murmur appreciated Abd soft, nontender, positive bowel sounds MSK no focal spinal tenderness, no joint edema Neuro: non-focal, well-oriented, appropriate affect   Lab Results  Component Value Date   WBC 6.5 03/05/2023   HGB 11.7 (L) 03/05/2023   HCT 36.6 03/05/2023   MCV 90.8 03/05/2023   PLT 371 03/05/2023   Lab Results  Component Value Date   FERRITIN 726 (H) 02/26/2023   IRON 66 02/26/2023   TIBC 300 02/26/2023   UIBC 234 02/26/2023   IRONPCTSAT 22 02/26/2023   Lab Results  Component Value Date   RETICCTPCT 1.5 12/14/2021   RBC  4.03 03/05/2023   No results found for: "KPAFRELGTCHN", "LAMBDASER", "KAPLAMBRATIO" No results found for: "IGGSERUM", "IGA", "IGMSERUM" No results found for: "TOTALPROTELP", "ALBUMINELP", "A1GS", "A2GS", "BETS", "BETA2SER", "GAMS", "MSPIKE", "SPEI"   Chemistry      Component Value Date/Time   NA 142 03/05/2023 0829   K 4.4 03/05/2023 0829   CL 104 03/05/2023 0829   CO2 31 03/05/2023 0829   BUN 8 03/05/2023 0829   CREATININE 0.82 03/05/2023 0829      Component Value Date/Time   CALCIUM 9.4 03/05/2023 0829   ALKPHOS 101 03/05/2023 0829   AST 14 (L) 03/05/2023 0829   ALT 8 03/05/2023 0829   BILITOT 0.4 03/05/2023 0829     Encounter Diagnoses  Name Primary?   Cecal cancer (HCC) Yes   Metastases to the liver [facility])    Impression and Plan: Lauren Mcpherson is a very pleasant 57 yo African American female with metastatic 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   She is here for consideration of cycle 5 of FOLFOX/Avastin.  Labs reviewed and acceptable for treatment today RTC 2 days for pump D/c RTC 14 days MD, PICC labs, Cycle 6 FOLFOX/Avastin PET scan/MR liver needs to be scheduled.    Continue PICC flush 3 days a week, MWF  Lauren Chestnut, PA-C 11/11/20249:16 AM

## 2023-03-05 NOTE — Progress Notes (Signed)
PICC line accessed, site flushes well, no pain or swelling noted. No blood return noted. Cath flo administered at 0855, site marked.

## 2023-03-06 ENCOUNTER — Encounter: Payer: Self-pay | Admitting: Family

## 2023-03-06 ENCOUNTER — Other Ambulatory Visit: Payer: Self-pay

## 2023-03-06 ENCOUNTER — Encounter: Payer: Self-pay | Admitting: Hematology & Oncology

## 2023-03-07 ENCOUNTER — Inpatient Hospital Stay: Payer: Medicaid Other

## 2023-03-07 ENCOUNTER — Telehealth: Payer: Self-pay

## 2023-03-07 ENCOUNTER — Inpatient Hospital Stay: Payer: No Typology Code available for payment source

## 2023-03-07 VITALS — BP 128/90 | HR 80 | Temp 97.8°F | Resp 20

## 2023-03-07 DIAGNOSIS — C180 Malignant neoplasm of cecum: Secondary | ICD-10-CM

## 2023-03-07 DIAGNOSIS — Z5112 Encounter for antineoplastic immunotherapy: Secondary | ICD-10-CM | POA: Diagnosis not present

## 2023-03-07 MED ORDER — PEGFILGRASTIM-JMDB 6 MG/0.6ML ~~LOC~~ SOSY
6.0000 mg | PREFILLED_SYRINGE | Freq: Once | SUBCUTANEOUS | Status: AC
  Administered 2023-03-07: 6 mg via SUBCUTANEOUS
  Filled 2023-03-07: qty 0.6

## 2023-03-07 MED ORDER — SODIUM CHLORIDE 0.9% FLUSH
10.0000 mL | INTRAVENOUS | Status: DC | PRN
Start: 2023-03-07 — End: 2023-03-07
  Administered 2023-03-07: 10 mL

## 2023-03-07 MED ORDER — HEPARIN SOD (PORK) LOCK FLUSH 100 UNIT/ML IV SOLN
500.0000 [IU] | Freq: Once | INTRAVENOUS | Status: AC | PRN
  Administered 2023-03-07: 500 [IU]

## 2023-03-07 NOTE — Telephone Encounter (Signed)
Per pt, Patient scheduled at [facility] imaging for MRI and PET scan 03/16/2023.

## 2023-03-07 NOTE — Patient Instructions (Signed)
PICC Home Care Guide A peripherally inserted central catheter (PICC) is a form of IV access that allows medicines and IV fluids to be quickly put into the blood and spread throughout the body. The PICC is a long, thin, flexible tube (catheter) that is put into a vein in a person's arm or leg. The catheter ends in a large vein just outside the heart called the superior vena cava (SVC). After the PICC is put in, a chest X-ray may be done to make sure that it is in the right place. A PICC may be placed for different reasons, such as: To give medicines and liquid nutrition. To give IV fluids and blood products. To take blood samples often. If there is trouble placing a peripheral intravenous (PIV) catheter. If cared for properly, a PICC can remain in place for many months. Having a PICC can allow you to go home from the hospital sooner and continue treatment at home. Medicines and PICC care can be managed at home by a family member, caregiver, or home health care team. What are the risks? Generally, having a PICC is safe. However, problems may occur, including: A blood clot (thrombus) forming in or at the end of the PICC. A blood clot forming in a vein (deep vein thrombosis) or traveling to the lung (pulmonary embolism). Inflammation of the vein (phlebitis) in which the PICC is placed. Infection at the insertion site or in the blood. Blood infections from central lines, like PICCs, can be serious and often require a hospital stay. PICC malposition, or PICC movement or poor placement. A break or cut in the PICC. Do not use scissors near the PICC. Nerve or tendon irritation or injury during PICC insertion. How to care for your PICC Please follow the specific guidelines provided by your health care provider. Preventing infection You and any caregivers should wash your hands often with soap and water for at least 20 seconds. Wash hands: Before touching the PICC or the infusion device. Before changing a  bandage (dressing). Do not change the dressing unless you have been taught to do so and have shown you are able to change it safely. Flush the PICC as told. Tell your health care provider right away if the PICC is hard to flush or does not flush. Do not use force to flush the PICC. Use clean and germ-free (sterile) supplies only. Keep the supplies in a dry place. Do not reuse needles, syringes, or any other supplies. Reusing supplies can lead to infection. Keep the PICC dressing dry and secure it with tape if the edges stop sticking to your skin. Check your PICC insertion site every day for signs of infection. Check for: Redness, swelling, or pain. Fluid or blood. Warmth. Pus or a bad smell. Preventing other problems Do not use a syringe that is less than 10 mL to flush the PICC. Do not have your blood pressure checked on the arm in which the PICC is placed. Do not ever pull or tug on the PICC. Keep it secured to your arm with tape or a stretch wrap when not in use. Do not take the PICC out yourself. Only a trained health care provider should remove the PICC. Keep pets and children away from your PICC. How to care for your PICC dressing Keep your PICC dressing clean and dry to prevent infection. Do not take baths, swim, or use a hot tub until your health care provider approves. Ask your health care provider if you can take  showers. You may only be allowed to take sponge baths. When you are allowed to shower: Ask your health care provider to teach you how to wrap the PICC. Cover the PICC with clear plastic wrap and tape to keep it dry while showering. Follow instructions from your health care provider about how to take care of your insertion site and dressing. Make sure you: Wash your hands with soap and water for at least 20 seconds before and after you change your dressing. If soap and water are not available, use hand sanitizer. Change your dressing only if taught to do so by your health care  provider. Your PICC dressing needs to be changed if it becomes loose or wet. Leave stitches (sutures), skin glue, or adhesive strips in place. These skin closures may need to stay in place for 2 weeks or longer. If adhesive strip edges start to loosen and curl up, you may trim the loose edges. Do not remove adhesive strips completely unless your health care provider tells you to do that. Follow these instructions at home: Disposal of supplies Throw away any syringes in a disposal container that is meant for sharp items (sharps container). You can buy a sharps container from a pharmacy, or you can make one by using an empty, hard plastic bottle with a lid. Place any used dressings or infusion bags into a plastic bag. Throw that bag in the trash. General instructions  Always carry your PICC identification card or wear a medical alert bracelet. Keep the tube clamped at all times, unless it is being used. Always carry a smooth-edge clamp with you to clamp the PICC if it breaks. Do not use scissors or sharp objects near the tube. You may bend your arm and move it freely. If your PICC is near or at the bend of your elbow, avoid activity with repeated motion at the elbow. Avoid lifting heavy objects as told by your health care provider. Keep all follow-up visits. This is important. You will need to have your PICC dressing changed at least once a week. Contact a health care provider if: You have pain in your arm, ear, face, or teeth. You have a fever or chills. You have redness, swelling, or pain around the insertion site. You have fluid or blood coming from the insertion site. Your insertion site feels warm to the touch. You have pus or a bad smell coming from the insertion site. Your skin feels hard and raised around the insertion site. Your PICC dressing has gotten wet or is coming off and you have not been taught how to change it. Get help right away if: You have problems with your PICC, such as  your PICC: Was tugged or pulled and has partially come out. Do not  push the PICC back in. Cannot be flushed, is hard to flush, or leaks around the insertion site when it is flushed. Makes a flushing sound when it is flushed. Appears to have a hole or tear. Is accidentally pulled all the way out. If this happens, cover the insertion site with a gauze dressing. Do not throw the PICC away. Your health care provider will need to check it to be sure the entire catheter came out. You feel your heart racing or skipping beats, or you have chest pain. You have shortness of breath or trouble breathing. You have swelling, redness, warmth, or pain in the arm in which the PICC is placed. You have a red streak going up your arm that  starts under the PICC dressing. These symptoms may be an emergency. Get help right away. Call 911. Do not wait to see if the symptoms will go away. Do not drive yourself to the hospital. Summary A peripherally inserted central catheter (PICC) is a long, thin, flexible tube (catheter) that is put into a vein in the arm or leg. If cared for properly, a PICC can remain in place for many months. Having a PICC can allow you to go home from the hospital sooner and continue treatment at home. The PICC is inserted using a germ-free (sterile) technique by a specially trained health care provider. Only a trained health care provider should remove it. Do not have your blood pressure checked on the arm in which your PICC is placed. Always keep your PICC identification card with you. This information is not intended to replace advice given to you by your health care provider. Make sure you discuss any questions you have with your health care provider. Document Revised: 10/27/2020 Document Reviewed: 10/27/2020 Elsevier Patient Education  2024 [employer].

## 2023-03-09 ENCOUNTER — Inpatient Hospital Stay: Payer: No Typology Code available for payment source

## 2023-03-09 VITALS — BP 109/87 | HR 99 | Temp 97.7°F

## 2023-03-09 DIAGNOSIS — C180 Malignant neoplasm of cecum: Secondary | ICD-10-CM

## 2023-03-09 DIAGNOSIS — C787 Secondary malignant neoplasm of liver and intrahepatic bile duct: Secondary | ICD-10-CM

## 2023-03-09 DIAGNOSIS — Z5112 Encounter for antineoplastic immunotherapy: Secondary | ICD-10-CM | POA: Diagnosis not present

## 2023-03-09 MED ORDER — HEPARIN SOD (PORK) LOCK FLUSH 100 UNIT/ML IV SOLN
250.0000 [IU] | Freq: Once | INTRAVENOUS | Status: AC
  Administered 2023-03-09: 250 [IU]

## 2023-03-09 MED ORDER — SODIUM CHLORIDE 0.9% FLUSH
10.0000 mL | Freq: Once | INTRAVENOUS | Status: AC
  Administered 2023-03-09: 10 mL

## 2023-03-09 NOTE — Patient Instructions (Signed)
PICC Home Care Guide A peripherally inserted central catheter (PICC) is a form of IV access that allows medicines and IV fluids to be quickly put into the blood and spread throughout the body. The PICC is a long, thin, flexible tube (catheter) that is put into a vein in a person's arm or leg. The catheter ends in a large vein just outside the heart called the superior vena cava (SVC). After the PICC is put in, a chest X-ray may be done to make sure that it is in the right place. A PICC may be placed for different reasons, such as: To give medicines and liquid nutrition. To give IV fluids and blood products. To take blood samples often. If there is trouble placing a peripheral intravenous (PIV) catheter. If cared for properly, a PICC can remain in place for many months. Having a PICC can allow you to go home from the hospital sooner and continue treatment at home. Medicines and PICC care can be managed at home by a family member, caregiver, or home health care team. What are the risks? Generally, having a PICC is safe. However, problems may occur, including: A blood clot (thrombus) forming in or at the end of the PICC. A blood clot forming in a vein (deep vein thrombosis) or traveling to the lung (pulmonary embolism). Inflammation of the vein (phlebitis) in which the PICC is placed. Infection at the insertion site or in the blood. Blood infections from central lines, like PICCs, can be serious and often require a hospital stay. PICC malposition, or PICC movement or poor placement. A break or cut in the PICC. Do not use scissors near the PICC. Nerve or tendon irritation or injury during PICC insertion. How to care for your PICC Please follow the specific guidelines provided by your health care provider. Preventing infection You and any caregivers should wash your hands often with soap and water for at least 20 seconds. Wash hands: Before touching the PICC or the infusion device. Before changing a  bandage (dressing). Do not change the dressing unless you have been taught to do so and have shown you are able to change it safely. Flush the PICC as told. Tell your health care provider right away if the PICC is hard to flush or does not flush. Do not use force to flush the PICC. Use clean and germ-free (sterile) supplies only. Keep the supplies in a dry place. Do not reuse needles, syringes, or any other supplies. Reusing supplies can lead to infection. Keep the PICC dressing dry and secure it with tape if the edges stop sticking to your skin. Check your PICC insertion site every day for signs of infection. Check for: Redness, swelling, or pain. Fluid or blood. Warmth. Pus or a bad smell. Preventing other problems Do not use a syringe that is less than 10 mL to flush the PICC. Do not have your blood pressure checked on the arm in which the PICC is placed. Do not ever pull or tug on the PICC. Keep it secured to your arm with tape or a stretch wrap when not in use. Do not take the PICC out yourself. Only a trained health care provider should remove the PICC. Keep pets and children away from your PICC. How to care for your PICC dressing Keep your PICC dressing clean and dry to prevent infection. Do not take baths, swim, or use a hot tub until your health care provider approves. Ask your health care provider if you can take  showers. You may only be allowed to take sponge baths. When you are allowed to shower: Ask your health care provider to teach you how to wrap the PICC. Cover the PICC with clear plastic wrap and tape to keep it dry while showering. Follow instructions from your health care provider about how to take care of your insertion site and dressing. Make sure you: Wash your hands with soap and water for at least 20 seconds before and after you change your dressing. If soap and water are not available, use hand sanitizer. Change your dressing only if taught to do so by your health care  provider. Your PICC dressing needs to be changed if it becomes loose or wet. Leave stitches (sutures), skin glue, or adhesive strips in place. These skin closures may need to stay in place for 2 weeks or longer. If adhesive strip edges start to loosen and curl up, you may trim the loose edges. Do not remove adhesive strips completely unless your health care provider tells you to do that. Follow these instructions at home: Disposal of supplies Throw away any syringes in a disposal container that is meant for sharp items (sharps container). You can buy a sharps container from a pharmacy, or you can make one by using an empty, hard plastic bottle with a lid. Place any used dressings or infusion bags into a plastic bag. Throw that bag in the trash. General instructions  Always carry your PICC identification card or wear a medical alert bracelet. Keep the tube clamped at all times, unless it is being used. Always carry a smooth-edge clamp with you to clamp the PICC if it breaks. Do not use scissors or sharp objects near the tube. You may bend your arm and move it freely. If your PICC is near or at the bend of your elbow, avoid activity with repeated motion at the elbow. Avoid lifting heavy objects as told by your health care provider. Keep all follow-up visits. This is important. You will need to have your PICC dressing changed at least once a week. Contact a health care provider if: You have pain in your arm, ear, face, or teeth. You have a fever or chills. You have redness, swelling, or pain around the insertion site. You have fluid or blood coming from the insertion site. Your insertion site feels warm to the touch. You have pus or a bad smell coming from the insertion site. Your skin feels hard and raised around the insertion site. Your PICC dressing has gotten wet or is coming off and you have not been taught how to change it. Get help right away if: You have problems with your PICC, such as  your PICC: Was tugged or pulled and has partially come out. Do not  push the PICC back in. Cannot be flushed, is hard to flush, or leaks around the insertion site when it is flushed. Makes a flushing sound when it is flushed. Appears to have a hole or tear. Is accidentally pulled all the way out. If this happens, cover the insertion site with a gauze dressing. Do not throw the PICC away. Your health care provider will need to check it to be sure the entire catheter came out. You feel your heart racing or skipping beats, or you have chest pain. You have shortness of breath or trouble breathing. You have swelling, redness, warmth, or pain in the arm in which the PICC is placed. You have a red streak going up your arm that  starts under the PICC dressing. These symptoms may be an emergency. Get help right away. Call 911. Do not wait to see if the symptoms will go away. Do not drive yourself to the hospital. Summary A peripherally inserted central catheter (PICC) is a long, thin, flexible tube (catheter) that is put into a vein in the arm or leg. If cared for properly, a PICC can remain in place for many months. Having a PICC can allow you to go home from the hospital sooner and continue treatment at home. The PICC is inserted using a germ-free (sterile) technique by a specially trained health care provider. Only a trained health care provider should remove it. Do not have your blood pressure checked on the arm in which your PICC is placed. Always keep your PICC identification card with you. This information is not intended to replace advice given to you by your health care provider. Make sure you discuss any questions you have with your health care provider. Document Revised: 10/27/2020 Document Reviewed: 10/27/2020 Elsevier Patient Education  2024 [employer].

## 2023-03-12 ENCOUNTER — Inpatient Hospital Stay: Payer: Medicaid Other

## 2023-03-12 ENCOUNTER — Encounter: Payer: Self-pay | Admitting: Hematology & Oncology

## 2023-03-12 ENCOUNTER — Telehealth: Payer: Self-pay

## 2023-03-12 VITALS — BP 125/90 | HR 98 | Temp 98.2°F | Resp 18

## 2023-03-12 DIAGNOSIS — Z5112 Encounter for antineoplastic immunotherapy: Secondary | ICD-10-CM | POA: Diagnosis not present

## 2023-03-12 DIAGNOSIS — C180 Malignant neoplasm of cecum: Secondary | ICD-10-CM

## 2023-03-12 MED ORDER — SODIUM CHLORIDE 0.9% FLUSH
10.0000 mL | Freq: Once | INTRAVENOUS | Status: AC
  Administered 2023-03-12: 10 mL via INTRAVENOUS

## 2023-03-12 NOTE — Patient Instructions (Signed)
PICC Home Care Guide A peripherally inserted central catheter (PICC) is a form of IV access that allows medicines and IV fluids to be quickly put into the blood and spread throughout the body. The PICC is a long, thin, flexible tube (catheter) that is put into a vein in a person's arm or leg. The catheter ends in a large vein just outside the heart called the superior vena cava (SVC). After the PICC is put in, a chest X-ray may be done to make sure that it is in the right place. A PICC may be placed for different reasons, such as: To give medicines and liquid nutrition. To give IV fluids and blood products. To take blood samples often. If there is trouble placing a peripheral intravenous (PIV) catheter. If cared for properly, a PICC can remain in place for many months. Having a PICC can allow you to go home from the hospital sooner and continue treatment at home. Medicines and PICC care can be managed at home by a family member, caregiver, or home health care team. What are the risks? Generally, having a PICC is safe. However, problems may occur, including: A blood clot (thrombus) forming in or at the end of the PICC. A blood clot forming in a vein (deep vein thrombosis) or traveling to the lung (pulmonary embolism). Inflammation of the vein (phlebitis) in which the PICC is placed. Infection at the insertion site or in the blood. Blood infections from central lines, like PICCs, can be serious and often require a hospital stay. PICC malposition, or PICC movement or poor placement. A break or cut in the PICC. Do not use scissors near the PICC. Nerve or tendon irritation or injury during PICC insertion. How to care for your PICC Please follow the specific guidelines provided by your health care provider. Preventing infection You and any caregivers should wash your hands often with soap and water for at least 20 seconds. Wash hands: Before touching the PICC or the infusion device. Before changing a  bandage (dressing). Do not change the dressing unless you have been taught to do so and have shown you are able to change it safely. Flush the PICC as told. Tell your health care provider right away if the PICC is hard to flush or does not flush. Do not use force to flush the PICC. Use clean and germ-free (sterile) supplies only. Keep the supplies in a dry place. Do not reuse needles, syringes, or any other supplies. Reusing supplies can lead to infection. Keep the PICC dressing dry and secure it with tape if the edges stop sticking to your skin. Check your PICC insertion site every day for signs of infection. Check for: Redness, swelling, or pain. Fluid or blood. Warmth. Pus or a bad smell. Preventing other problems Do not use a syringe that is less than 10 mL to flush the PICC. Do not have your blood pressure checked on the arm in which the PICC is placed. Do not ever pull or tug on the PICC. Keep it secured to your arm with tape or a stretch wrap when not in use. Do not take the PICC out yourself. Only a trained health care provider should remove the PICC. Keep pets and children away from your PICC. How to care for your PICC dressing Keep your PICC dressing clean and dry to prevent infection. Do not take baths, swim, or use a hot tub until your health care provider approves. Ask your health care provider if you can take  showers. You may only be allowed to take sponge baths. When you are allowed to shower: Ask your health care provider to teach you how to wrap the PICC. Cover the PICC with clear plastic wrap and tape to keep it dry while showering. Follow instructions from your health care provider about how to take care of your insertion site and dressing. Make sure you: Wash your hands with soap and water for at least 20 seconds before and after you change your dressing. If soap and water are not available, use hand sanitizer. Change your dressing only if taught to do so by your health care  provider. Your PICC dressing needs to be changed if it becomes loose or wet. Leave stitches (sutures), skin glue, or adhesive strips in place. These skin closures may need to stay in place for 2 weeks or longer. If adhesive strip edges start to loosen and curl up, you may trim the loose edges. Do not remove adhesive strips completely unless your health care provider tells you to do that. Follow these instructions at home: Disposal of supplies Throw away any syringes in a disposal container that is meant for sharp items (sharps container). You can buy a sharps container from a pharmacy, or you can make one by using an empty, hard plastic bottle with a lid. Place any used dressings or infusion bags into a plastic bag. Throw that bag in the trash. General instructions  Always carry your PICC identification card or wear a medical alert bracelet. Keep the tube clamped at all times, unless it is being used. Always carry a smooth-edge clamp with you to clamp the PICC if it breaks. Do not use scissors or sharp objects near the tube. You may bend your arm and move it freely. If your PICC is near or at the bend of your elbow, avoid activity with repeated motion at the elbow. Avoid lifting heavy objects as told by your health care provider. Keep all follow-up visits. This is important. You will need to have your PICC dressing changed at least once a week. Contact a health care provider if: You have pain in your arm, ear, face, or teeth. You have a fever or chills. You have redness, swelling, or pain around the insertion site. You have fluid or blood coming from the insertion site. Your insertion site feels warm to the touch. You have pus or a bad smell coming from the insertion site. Your skin feels hard and raised around the insertion site. Your PICC dressing has gotten wet or is coming off and you have not been taught how to change it. Get help right away if: You have problems with your PICC, such as  your PICC: Was tugged or pulled and has partially come out. Do not  push the PICC back in. Cannot be flushed, is hard to flush, or leaks around the insertion site when it is flushed. Makes a flushing sound when it is flushed. Appears to have a hole or tear. Is accidentally pulled all the way out. If this happens, cover the insertion site with a gauze dressing. Do not throw the PICC away. Your health care provider will need to check it to be sure the entire catheter came out. You feel your heart racing or skipping beats, or you have chest pain. You have shortness of breath or trouble breathing. You have swelling, redness, warmth, or pain in the arm in which the PICC is placed. You have a red streak going up your arm that  starts under the PICC dressing. These symptoms may be an emergency. Get help right away. Call 911. Do not wait to see if the symptoms will go away. Do not drive yourself to the hospital. Summary A peripherally inserted central catheter (PICC) is a long, thin, flexible tube (catheter) that is put into a vein in the arm or leg. If cared for properly, a PICC can remain in place for many months. Having a PICC can allow you to go home from the hospital sooner and continue treatment at home. The PICC is inserted using a germ-free (sterile) technique by a specially trained health care provider. Only a trained health care provider should remove it. Do not have your blood pressure checked on the arm in which your PICC is placed. Always keep your PICC identification card with you. This information is not intended to replace advice given to you by your health care provider. Make sure you discuss any questions you have with your health care provider. Document Revised: 10/27/2020 Document Reviewed: 10/27/2020 Elsevier Patient Education  2024 [employer].

## 2023-03-12 NOTE — Telephone Encounter (Signed)
Pt in office today for PICC flush. Pt questions if Dr Myna Hidalgo would be willing to let her push appts back from 11/25 to 12/2 so she can enjoy Thanksgiving. OK per Dr Myna Hidalgo. Message to scheduling to update appts. dph

## 2023-03-14 ENCOUNTER — Inpatient Hospital Stay: Payer: No Typology Code available for payment source

## 2023-03-14 VITALS — BP 129/88 | HR 96 | Resp 18

## 2023-03-14 DIAGNOSIS — Z5112 Encounter for antineoplastic immunotherapy: Secondary | ICD-10-CM | POA: Diagnosis not present

## 2023-03-14 DIAGNOSIS — R232 Flushing: Secondary | ICD-10-CM

## 2023-03-14 MED ORDER — SODIUM CHLORIDE 0.9% FLUSH
10.0000 mL | Freq: Once | INTRAVENOUS | Status: AC
  Administered 2023-03-14: 10 mL via INTRAVENOUS

## 2023-03-14 MED ORDER — HEPARIN SOD (PORK) LOCK FLUSH 100 UNIT/ML IV SOLN
500.0000 [IU] | Freq: Once | INTRAVENOUS | Status: AC
  Administered 2023-03-14: 500 [IU] via INTRAVENOUS

## 2023-03-14 NOTE — Patient Instructions (Signed)
PICC Home Care Guide A peripherally inserted central catheter (PICC) is a form of IV access that allows medicines and IV fluids to be quickly put into the blood and spread throughout the body. The PICC is a long, thin, flexible tube (catheter) that is put into a vein in a person's arm or leg. The catheter ends in a large vein just outside the heart called the superior vena cava (SVC). After the PICC is put in, a chest X-ray may be done to make sure that it is in the right place. A PICC may be placed for different reasons, such as: To give medicines and liquid nutrition. To give IV fluids and blood products. To take blood samples often. If there is trouble placing a peripheral intravenous (PIV) catheter. If cared for properly, a PICC can remain in place for many months. Having a PICC can allow you to go home from the hospital sooner and continue treatment at home. Medicines and PICC care can be managed at home by a family member, caregiver, or home health care team. What are the risks? Generally, having a PICC is safe. However, problems may occur, including: A blood clot (thrombus) forming in or at the end of the PICC. A blood clot forming in a vein (deep vein thrombosis) or traveling to the lung (pulmonary embolism). Inflammation of the vein (phlebitis) in which the PICC is placed. Infection at the insertion site or in the blood. Blood infections from central lines, like PICCs, can be serious and often require a hospital stay. PICC malposition, or PICC movement or poor placement. A break or cut in the PICC. Do not use scissors near the PICC. Nerve or tendon irritation or injury during PICC insertion. How to care for your PICC Please follow the specific guidelines provided by your health care provider. Preventing infection You and any caregivers should wash your hands often with soap and water for at least 20 seconds. Wash hands: Before touching the PICC or the infusion device. Before changing a  bandage (dressing). Do not change the dressing unless you have been taught to do so and have shown you are able to change it safely. Flush the PICC as told. Tell your health care provider right away if the PICC is hard to flush or does not flush. Do not use force to flush the PICC. Use clean and germ-free (sterile) supplies only. Keep the supplies in a dry place. Do not reuse needles, syringes, or any other supplies. Reusing supplies can lead to infection. Keep the PICC dressing dry and secure it with tape if the edges stop sticking to your skin. Check your PICC insertion site every day for signs of infection. Check for: Redness, swelling, or pain. Fluid or blood. Warmth. Pus or a bad smell. Preventing other problems Do not use a syringe that is less than 10 mL to flush the PICC. Do not have your blood pressure checked on the arm in which the PICC is placed. Do not ever pull or tug on the PICC. Keep it secured to your arm with tape or a stretch wrap when not in use. Do not take the PICC out yourself. Only a trained health care provider should remove the PICC. Keep pets and children away from your PICC. How to care for your PICC dressing Keep your PICC dressing clean and dry to prevent infection. Do not take baths, swim, or use a hot tub until your health care provider approves. Ask your health care provider if you can take  showers. You may only be allowed to take sponge baths. When you are allowed to shower: Ask your health care provider to teach you how to wrap the PICC. Cover the PICC with clear plastic wrap and tape to keep it dry while showering. Follow instructions from your health care provider about how to take care of your insertion site and dressing. Make sure you: Wash your hands with soap and water for at least 20 seconds before and after you change your dressing. If soap and water are not available, use hand sanitizer. Change your dressing only if taught to do so by your health care  provider. Your PICC dressing needs to be changed if it becomes loose or wet. Leave stitches (sutures), skin glue, or adhesive strips in place. These skin closures may need to stay in place for 2 weeks or longer. If adhesive strip edges start to loosen and curl up, you may trim the loose edges. Do not remove adhesive strips completely unless your health care provider tells you to do that. Follow these instructions at home: Disposal of supplies Throw away any syringes in a disposal container that is meant for sharp items (sharps container). You can buy a sharps container from a pharmacy, or you can make one by using an empty, hard plastic bottle with a lid. Place any used dressings or infusion bags into a plastic bag. Throw that bag in the trash. General instructions  Always carry your PICC identification card or wear a medical alert bracelet. Keep the tube clamped at all times, unless it is being used. Always carry a smooth-edge clamp with you to clamp the PICC if it breaks. Do not use scissors or sharp objects near the tube. You may bend your arm and move it freely. If your PICC is near or at the bend of your elbow, avoid activity with repeated motion at the elbow. Avoid lifting heavy objects as told by your health care provider. Keep all follow-up visits. This is important. You will need to have your PICC dressing changed at least once a week. Contact a health care provider if: You have pain in your arm, ear, face, or teeth. You have a fever or chills. You have redness, swelling, or pain around the insertion site. You have fluid or blood coming from the insertion site. Your insertion site feels warm to the touch. You have pus or a bad smell coming from the insertion site. Your skin feels hard and raised around the insertion site. Your PICC dressing has gotten wet or is coming off and you have not been taught how to change it. Get help right away if: You have problems with your PICC, such as  your PICC: Was tugged or pulled and has partially come out. Do not  push the PICC back in. Cannot be flushed, is hard to flush, or leaks around the insertion site when it is flushed. Makes a flushing sound when it is flushed. Appears to have a hole or tear. Is accidentally pulled all the way out. If this happens, cover the insertion site with a gauze dressing. Do not throw the PICC away. Your health care provider will need to check it to be sure the entire catheter came out. You feel your heart racing or skipping beats, or you have chest pain. You have shortness of breath or trouble breathing. You have swelling, redness, warmth, or pain in the arm in which the PICC is placed. You have a red streak going up your arm that  starts under the PICC dressing. These symptoms may be an emergency. Get help right away. Call 911. Do not wait to see if the symptoms will go away. Do not drive yourself to the hospital. Summary A peripherally inserted central catheter (PICC) is a long, thin, flexible tube (catheter) that is put into a vein in the arm or leg. If cared for properly, a PICC can remain in place for many months. Having a PICC can allow you to go home from the hospital sooner and continue treatment at home. The PICC is inserted using a germ-free (sterile) technique by a specially trained health care provider. Only a trained health care provider should remove it. Do not have your blood pressure checked on the arm in which your PICC is placed. Always keep your PICC identification card with you. This information is not intended to replace advice given to you by your health care provider. Make sure you discuss any questions you have with your health care provider. Document Revised: 10/27/2020 Document Reviewed: 10/27/2020 Elsevier Patient Education  2024 [employer].

## 2023-03-16 ENCOUNTER — Inpatient Hospital Stay: Payer: No Typology Code available for payment source

## 2023-03-16 VITALS — BP 130/93 | HR 108 | Temp 97.9°F | Resp 19

## 2023-03-16 DIAGNOSIS — Z5112 Encounter for antineoplastic immunotherapy: Secondary | ICD-10-CM | POA: Diagnosis not present

## 2023-03-16 DIAGNOSIS — C180 Malignant neoplasm of cecum: Secondary | ICD-10-CM

## 2023-03-16 MED ORDER — HEPARIN SOD (PORK) LOCK FLUSH 100 UNIT/ML IV SOLN
250.0000 [IU] | Freq: Once | INTRAVENOUS | Status: AC
  Administered 2023-03-16: 250 [IU]

## 2023-03-16 MED ORDER — SODIUM CHLORIDE 0.9% FLUSH
10.0000 mL | Freq: Once | INTRAVENOUS | Status: AC
  Administered 2023-03-16: 10 mL

## 2023-03-16 NOTE — Patient Instructions (Signed)
PICC Home Care Guide A peripherally inserted central catheter (PICC) is a form of IV access that allows medicines and IV fluids to be quickly put into the blood and spread throughout the body. The PICC is a long, thin, flexible tube (catheter) that is put into a vein in a person's arm or leg. The catheter ends in a large vein just outside the heart called the superior vena cava (SVC). After the PICC is put in, a chest X-ray may be done to make sure that it is in the right place. A PICC may be placed for different reasons, such as: To give medicines and liquid nutrition. To give IV fluids and blood products. To take blood samples often. If there is trouble placing a peripheral intravenous (PIV) catheter. If cared for properly, a PICC can remain in place for many months. Having a PICC can allow you to go home from the hospital sooner and continue treatment at home. Medicines and PICC care can be managed at home by a family member, caregiver, or home health care team. What are the risks? Generally, having a PICC is safe. However, problems may occur, including: A blood clot (thrombus) forming in or at the end of the PICC. A blood clot forming in a vein (deep vein thrombosis) or traveling to the lung (pulmonary embolism). Inflammation of the vein (phlebitis) in which the PICC is placed. Infection at the insertion site or in the blood. Blood infections from central lines, like PICCs, can be serious and often require a hospital stay. PICC malposition, or PICC movement or poor placement. A break or cut in the PICC. Do not use scissors near the PICC. Nerve or tendon irritation or injury during PICC insertion. How to care for your PICC Please follow the specific guidelines provided by your health care provider. Preventing infection You and any caregivers should wash your hands often with soap and water for at least 20 seconds. Wash hands: Before touching the PICC or the infusion device. Before changing a  bandage (dressing). Do not change the dressing unless you have been taught to do so and have shown you are able to change it safely. Flush the PICC as told. Tell your health care provider right away if the PICC is hard to flush or does not flush. Do not use force to flush the PICC. Use clean and germ-free (sterile) supplies only. Keep the supplies in a dry place. Do not reuse needles, syringes, or any other supplies. Reusing supplies can lead to infection. Keep the PICC dressing dry and secure it with tape if the edges stop sticking to your skin. Check your PICC insertion site every day for signs of infection. Check for: Redness, swelling, or pain. Fluid or blood. Warmth. Pus or a bad smell. Preventing other problems Do not use a syringe that is less than 10 mL to flush the PICC. Do not have your blood pressure checked on the arm in which the PICC is placed. Do not ever pull or tug on the PICC. Keep it secured to your arm with tape or a stretch wrap when not in use. Do not take the PICC out yourself. Only a trained health care provider should remove the PICC. Keep pets and children away from your PICC. How to care for your PICC dressing Keep your PICC dressing clean and dry to prevent infection. Do not take baths, swim, or use a hot tub until your health care provider approves. Ask your health care provider if you can take  showers. You may only be allowed to take sponge baths. When you are allowed to shower: Ask your health care provider to teach you how to wrap the PICC. Cover the PICC with clear plastic wrap and tape to keep it dry while showering. Follow instructions from your health care provider about how to take care of your insertion site and dressing. Make sure you: Wash your hands with soap and water for at least 20 seconds before and after you change your dressing. If soap and water are not available, use hand sanitizer. Change your dressing only if taught to do so by your health care  provider. Your PICC dressing needs to be changed if it becomes loose or wet. Leave stitches (sutures), skin glue, or adhesive strips in place. These skin closures may need to stay in place for 2 weeks or longer. If adhesive strip edges start to loosen and curl up, you may trim the loose edges. Do not remove adhesive strips completely unless your health care provider tells you to do that. Follow these instructions at home: Disposal of supplies Throw away any syringes in a disposal container that is meant for sharp items (sharps container). You can buy a sharps container from a pharmacy, or you can make one by using an empty, hard plastic bottle with a lid. Place any used dressings or infusion bags into a plastic bag. Throw that bag in the trash. General instructions  Always carry your PICC identification card or wear a medical alert bracelet. Keep the tube clamped at all times, unless it is being used. Always carry a smooth-edge clamp with you to clamp the PICC if it breaks. Do not use scissors or sharp objects near the tube. You may bend your arm and move it freely. If your PICC is near or at the bend of your elbow, avoid activity with repeated motion at the elbow. Avoid lifting heavy objects as told by your health care provider. Keep all follow-up visits. This is important. You will need to have your PICC dressing changed at least once a week. Contact a health care provider if: You have pain in your arm, ear, face, or teeth. You have a fever or chills. You have redness, swelling, or pain around the insertion site. You have fluid or blood coming from the insertion site. Your insertion site feels warm to the touch. You have pus or a bad smell coming from the insertion site. Your skin feels hard and raised around the insertion site. Your PICC dressing has gotten wet or is coming off and you have not been taught how to change it. Get help right away if: You have problems with your PICC, such as  your PICC: Was tugged or pulled and has partially come out. Do not  push the PICC back in. Cannot be flushed, is hard to flush, or leaks around the insertion site when it is flushed. Makes a flushing sound when it is flushed. Appears to have a hole or tear. Is accidentally pulled all the way out. If this happens, cover the insertion site with a gauze dressing. Do not throw the PICC away. Your health care provider will need to check it to be sure the entire catheter came out. You feel your heart racing or skipping beats, or you have chest pain. You have shortness of breath or trouble breathing. You have swelling, redness, warmth, or pain in the arm in which the PICC is placed. You have a red streak going up your arm that  starts under the PICC dressing. These symptoms may be an emergency. Get help right away. Call 911. Do not wait to see if the symptoms will go away. Do not drive yourself to the hospital. Summary A peripherally inserted central catheter (PICC) is a long, thin, flexible tube (catheter) that is put into a vein in the arm or leg. If cared for properly, a PICC can remain in place for many months. Having a PICC can allow you to go home from the hospital sooner and continue treatment at home. The PICC is inserted using a germ-free (sterile) technique by a specially trained health care provider. Only a trained health care provider should remove it. Do not have your blood pressure checked on the arm in which your PICC is placed. Always keep your PICC identification card with you. This information is not intended to replace advice given to you by your health care provider. Make sure you discuss any questions you have with your health care provider. Document Revised: 10/27/2020 Document Reviewed: 10/27/2020 Elsevier Patient Education  2024 [employer].

## 2023-03-19 ENCOUNTER — Inpatient Hospital Stay: Payer: No Typology Code available for payment source

## 2023-03-19 ENCOUNTER — Inpatient Hospital Stay: Payer: Medicaid Other | Admitting: Hematology & Oncology

## 2023-03-19 ENCOUNTER — Inpatient Hospital Stay: Payer: Medicaid Other

## 2023-03-19 VITALS — BP 118/91 | HR 95 | Temp 98.3°F | Resp 20

## 2023-03-19 DIAGNOSIS — C180 Malignant neoplasm of cecum: Secondary | ICD-10-CM

## 2023-03-19 DIAGNOSIS — Z5112 Encounter for antineoplastic immunotherapy: Secondary | ICD-10-CM | POA: Diagnosis not present

## 2023-03-19 MED ORDER — HEPARIN SOD (PORK) LOCK FLUSH 100 UNIT/ML IV SOLN
500.0000 [IU] | Freq: Once | INTRAVENOUS | Status: AC
Start: 2023-03-19 — End: 2023-03-19
  Administered 2023-03-19: 500 [IU] via INTRAVENOUS

## 2023-03-19 MED ORDER — SODIUM CHLORIDE 0.9% FLUSH
10.0000 mL | Freq: Once | INTRAVENOUS | Status: AC
  Administered 2023-03-19: 10 mL via INTRAVENOUS

## 2023-03-19 NOTE — Patient Instructions (Signed)
PICC Home Care Guide A peripherally inserted central catheter (PICC) is a form of IV access that allows medicines and IV fluids to be quickly put into the blood and spread throughout the body. The PICC is a long, thin, flexible tube (catheter) that is put into a vein in a person's arm or leg. The catheter ends in a large vein just outside the heart called the superior vena cava (SVC). After the PICC is put in, a chest X-ray may be done to make sure that it is in the right place. A PICC may be placed for different reasons, such as: To give medicines and liquid nutrition. To give IV fluids and blood products. To take blood samples often. If there is trouble placing a peripheral intravenous (PIV) catheter. If cared for properly, a PICC can remain in place for many months. Having a PICC can allow you to go home from the hospital sooner and continue treatment at home. Medicines and PICC care can be managed at home by a family member, caregiver, or home health care team. What are the risks? Generally, having a PICC is safe. However, problems may occur, including: A blood clot (thrombus) forming in or at the end of the PICC. A blood clot forming in a vein (deep vein thrombosis) or traveling to the lung (pulmonary embolism). Inflammation of the vein (phlebitis) in which the PICC is placed. Infection at the insertion site or in the blood. Blood infections from central lines, like PICCs, can be serious and often require a hospital stay. PICC malposition, or PICC movement or poor placement. A break or cut in the PICC. Do not use scissors near the PICC. Nerve or tendon irritation or injury during PICC insertion. How to care for your PICC Please follow the specific guidelines provided by your health care provider. Preventing infection You and any caregivers should wash your hands often with soap and water for at least 20 seconds. Wash hands: Before touching the PICC or the infusion device. Before changing a  bandage (dressing). Do not change the dressing unless you have been taught to do so and have shown you are able to change it safely. Flush the PICC as told. Tell your health care provider right away if the PICC is hard to flush or does not flush. Do not use force to flush the PICC. Use clean and germ-free (sterile) supplies only. Keep the supplies in a dry place. Do not reuse needles, syringes, or any other supplies. Reusing supplies can lead to infection. Keep the PICC dressing dry and secure it with tape if the edges stop sticking to your skin. Check your PICC insertion site every day for signs of infection. Check for: Redness, swelling, or pain. Fluid or blood. Warmth. Pus or a bad smell. Preventing other problems Do not use a syringe that is less than 10 mL to flush the PICC. Do not have your blood pressure checked on the arm in which the PICC is placed. Do not ever pull or tug on the PICC. Keep it secured to your arm with tape or a stretch wrap when not in use. Do not take the PICC out yourself. Only a trained health care provider should remove the PICC. Keep pets and children away from your PICC. How to care for your PICC dressing Keep your PICC dressing clean and dry to prevent infection. Do not take baths, swim, or use a hot tub until your health care provider approves. Ask your health care provider if you can take  showers. You may only be allowed to take sponge baths. When you are allowed to shower: Ask your health care provider to teach you how to wrap the PICC. Cover the PICC with clear plastic wrap and tape to keep it dry while showering. Follow instructions from your health care provider about how to take care of your insertion site and dressing. Make sure you: Wash your hands with soap and water for at least 20 seconds before and after you change your dressing. If soap and water are not available, use hand sanitizer. Change your dressing only if taught to do so by your health care  provider. Your PICC dressing needs to be changed if it becomes loose or wet. Leave stitches (sutures), skin glue, or adhesive strips in place. These skin closures may need to stay in place for 2 weeks or longer. If adhesive strip edges start to loosen and curl up, you may trim the loose edges. Do not remove adhesive strips completely unless your health care provider tells you to do that. Follow these instructions at home: Disposal of supplies Throw away any syringes in a disposal container that is meant for sharp items (sharps container). You can buy a sharps container from a pharmacy, or you can make one by using an empty, hard plastic bottle with a lid. Place any used dressings or infusion bags into a plastic bag. Throw that bag in the trash. General instructions  Always carry your PICC identification card or wear a medical alert bracelet. Keep the tube clamped at all times, unless it is being used. Always carry a smooth-edge clamp with you to clamp the PICC if it breaks. Do not use scissors or sharp objects near the tube. You may bend your arm and move it freely. If your PICC is near or at the bend of your elbow, avoid activity with repeated motion at the elbow. Avoid lifting heavy objects as told by your health care provider. Keep all follow-up visits. This is important. You will need to have your PICC dressing changed at least once a week. Contact a health care provider if: You have pain in your arm, ear, face, or teeth. You have a fever or chills. You have redness, swelling, or pain around the insertion site. You have fluid or blood coming from the insertion site. Your insertion site feels warm to the touch. You have pus or a bad smell coming from the insertion site. Your skin feels hard and raised around the insertion site. Your PICC dressing has gotten wet or is coming off and you have not been taught how to change it. Get help right away if: You have problems with your PICC, such as  your PICC: Was tugged or pulled and has partially come out. Do not  push the PICC back in. Cannot be flushed, is hard to flush, or leaks around the insertion site when it is flushed. Makes a flushing sound when it is flushed. Appears to have a hole or tear. Is accidentally pulled all the way out. If this happens, cover the insertion site with a gauze dressing. Do not throw the PICC away. Your health care provider will need to check it to be sure the entire catheter came out. You feel your heart racing or skipping beats, or you have chest pain. You have shortness of breath or trouble breathing. You have swelling, redness, warmth, or pain in the arm in which the PICC is placed. You have a red streak going up your arm that  starts under the PICC dressing. These symptoms may be an emergency. Get help right away. Call 911. Do not wait to see if the symptoms will go away. Do not drive yourself to the hospital. Summary A peripherally inserted central catheter (PICC) is a long, thin, flexible tube (catheter) that is put into a vein in the arm or leg. If cared for properly, a PICC can remain in place for many months. Having a PICC can allow you to go home from the hospital sooner and continue treatment at home. The PICC is inserted using a germ-free (sterile) technique by a specially trained health care provider. Only a trained health care provider should remove it. Do not have your blood pressure checked on the arm in which your PICC is placed. Always keep your PICC identification card with you. This information is not intended to replace advice given to you by your health care provider. Make sure you discuss any questions you have with your health care provider. Document Revised: 10/27/2020 Document Reviewed: 10/27/2020 Elsevier Patient Education  2024 [employer].

## 2023-03-21 ENCOUNTER — Inpatient Hospital Stay: Payer: No Typology Code available for payment source

## 2023-03-21 VITALS — BP 131/82 | HR 81 | Temp 98.3°F | Resp 17

## 2023-03-21 DIAGNOSIS — C180 Malignant neoplasm of cecum: Secondary | ICD-10-CM

## 2023-03-21 DIAGNOSIS — Z5112 Encounter for antineoplastic immunotherapy: Secondary | ICD-10-CM | POA: Diagnosis not present

## 2023-03-21 MED ORDER — SODIUM CHLORIDE 0.9% FLUSH: 10.0000 mL | Freq: Once | INTRAVENOUS | Status: DC

## 2023-03-21 MED ORDER — HEPARIN SOD (PORK) LOCK FLUSH 100 UNIT/ML IV SOLN: 500.0000 [IU] | Freq: Once | INTRAVENOUS | Status: DC

## 2023-03-21 NOTE — Patient Instructions (Signed)
PICC Home Care Guide A peripherally inserted central catheter (PICC) is a form of IV access that allows medicines and IV fluids to be quickly put into the blood and spread throughout the body. The PICC is a long, thin, flexible tube (catheter) that is put into a vein in a person's arm or leg. The catheter ends in a large vein just outside the heart called the superior vena cava (SVC). After the PICC is put in, a chest X-ray may be done to make sure that it is in the right place. A PICC may be placed for different reasons, such as: To give medicines and liquid nutrition. To give IV fluids and blood products. To take blood samples often. If there is trouble placing a peripheral intravenous (PIV) catheter. If cared for properly, a PICC can remain in place for many months. Having a PICC can allow you to go home from the hospital sooner and continue treatment at home. Medicines and PICC care can be managed at home by a family member, caregiver, or home health care team. What are the risks? Generally, having a PICC is safe. However, problems may occur, including: A blood clot (thrombus) forming in or at the end of the PICC. A blood clot forming in a vein (deep vein thrombosis) or traveling to the lung (pulmonary embolism). Inflammation of the vein (phlebitis) in which the PICC is placed. Infection at the insertion site or in the blood. Blood infections from central lines, like PICCs, can be serious and often require a hospital stay. PICC malposition, or PICC movement or poor placement. A break or cut in the PICC. Do not use scissors near the PICC. Nerve or tendon irritation or injury during PICC insertion. How to care for your PICC Please follow the specific guidelines provided by your health care provider. Preventing infection You and any caregivers should wash your hands often with soap and water for at least 20 seconds. Wash hands: Before touching the PICC or the infusion device. Before changing a  bandage (dressing). Do not change the dressing unless you have been taught to do so and have shown you are able to change it safely. Flush the PICC as told. Tell your health care provider right away if the PICC is hard to flush or does not flush. Do not use force to flush the PICC. Use clean and germ-free (sterile) supplies only. Keep the supplies in a dry place. Do not reuse needles, syringes, or any other supplies. Reusing supplies can lead to infection. Keep the PICC dressing dry and secure it with tape if the edges stop sticking to your skin. Check your PICC insertion site every day for signs of infection. Check for: Redness, swelling, or pain. Fluid or blood. Warmth. Pus or a bad smell. Preventing other problems Do not use a syringe that is less than 10 mL to flush the PICC. Do not have your blood pressure checked on the arm in which the PICC is placed. Do not ever pull or tug on the PICC. Keep it secured to your arm with tape or a stretch wrap when not in use. Do not take the PICC out yourself. Only a trained health care provider should remove the PICC. Keep pets and children away from your PICC. How to care for your PICC dressing Keep your PICC dressing clean and dry to prevent infection. Do not take baths, swim, or use a hot tub until your health care provider approves. Ask your health care provider if you can take  showers. You may only be allowed to take sponge baths. When you are allowed to shower: Ask your health care provider to teach you how to wrap the PICC. Cover the PICC with clear plastic wrap and tape to keep it dry while showering. Follow instructions from your health care provider about how to take care of your insertion site and dressing. Make sure you: Wash your hands with soap and water for at least 20 seconds before and after you change your dressing. If soap and water are not available, use hand sanitizer. Change your dressing only if taught to do so by your health care  provider. Your PICC dressing needs to be changed if it becomes loose or wet. Leave stitches (sutures), skin glue, or adhesive strips in place. These skin closures may need to stay in place for 2 weeks or longer. If adhesive strip edges start to loosen and curl up, you may trim the loose edges. Do not remove adhesive strips completely unless your health care provider tells you to do that. Follow these instructions at home: Disposal of supplies Throw away any syringes in a disposal container that is meant for sharp items (sharps container). You can buy a sharps container from a pharmacy, or you can make one by using an empty, hard plastic bottle with a lid. Place any used dressings or infusion bags into a plastic bag. Throw that bag in the trash. General instructions  Always carry your PICC identification card or wear a medical alert bracelet. Keep the tube clamped at all times, unless it is being used. Always carry a smooth-edge clamp with you to clamp the PICC if it breaks. Do not use scissors or sharp objects near the tube. You may bend your arm and move it freely. If your PICC is near or at the bend of your elbow, avoid activity with repeated motion at the elbow. Avoid lifting heavy objects as told by your health care provider. Keep all follow-up visits. This is important. You will need to have your PICC dressing changed at least once a week. Contact a health care provider if: You have pain in your arm, ear, face, or teeth. You have a fever or chills. You have redness, swelling, or pain around the insertion site. You have fluid or blood coming from the insertion site. Your insertion site feels warm to the touch. You have pus or a bad smell coming from the insertion site. Your skin feels hard and raised around the insertion site. Your PICC dressing has gotten wet or is coming off and you have not been taught how to change it. Get help right away if: You have problems with your PICC, such as  your PICC: Was tugged or pulled and has partially come out. Do not  push the PICC back in. Cannot be flushed, is hard to flush, or leaks around the insertion site when it is flushed. Makes a flushing sound when it is flushed. Appears to have a hole or tear. Is accidentally pulled all the way out. If this happens, cover the insertion site with a gauze dressing. Do not throw the PICC away. Your health care provider will need to check it to be sure the entire catheter came out. You feel your heart racing or skipping beats, or you have chest pain. You have shortness of breath or trouble breathing. You have swelling, redness, warmth, or pain in the arm in which the PICC is placed. You have a red streak going up your arm that  starts under the PICC dressing. These symptoms may be an emergency. Get help right away. Call 911. Do not wait to see if the symptoms will go away. Do not drive yourself to the hospital. Summary A peripherally inserted central catheter (PICC) is a long, thin, flexible tube (catheter) that is put into a vein in the arm or leg. If cared for properly, a PICC can remain in place for many months. Having a PICC can allow you to go home from the hospital sooner and continue treatment at home. The PICC is inserted using a germ-free (sterile) technique by a specially trained health care provider. Only a trained health care provider should remove it. Do not have your blood pressure checked on the arm in which your PICC is placed. Always keep your PICC identification card with you. This information is not intended to replace advice given to you by your health care provider. Make sure you discuss any questions you have with your health care provider. Document Revised: 10/27/2020 Document Reviewed: 10/27/2020 Elsevier Patient Education  2024 [employer].

## 2023-03-26 ENCOUNTER — Inpatient Hospital Stay: Payer: No Typology Code available for payment source

## 2023-03-26 ENCOUNTER — Inpatient Hospital Stay (HOSPITAL_BASED_OUTPATIENT_CLINIC_OR_DEPARTMENT_OTHER): Payer: No Typology Code available for payment source | Admitting: Medical Oncology

## 2023-03-26 ENCOUNTER — Inpatient Hospital Stay: Payer: No Typology Code available for payment source | Attending: Hematology & Oncology

## 2023-03-26 ENCOUNTER — Encounter: Payer: Self-pay | Admitting: Medical Oncology

## 2023-03-26 VITALS — BP 117/85 | HR 92 | Temp 97.9°F | Resp 18 | Ht 64.0 in | Wt 267.1 lb

## 2023-03-26 DIAGNOSIS — C787 Secondary malignant neoplasm of liver and intrahepatic bile duct: Secondary | ICD-10-CM | POA: Diagnosis not present

## 2023-03-26 DIAGNOSIS — Z7901 Long term (current) use of anticoagulants: Secondary | ICD-10-CM | POA: Insufficient documentation

## 2023-03-26 DIAGNOSIS — Z452 Encounter for adjustment and management of vascular access device: Secondary | ICD-10-CM

## 2023-03-26 DIAGNOSIS — I2699 Other pulmonary embolism without acute cor pulmonale: Secondary | ICD-10-CM | POA: Diagnosis not present

## 2023-03-26 DIAGNOSIS — C20 Malignant neoplasm of rectum: Secondary | ICD-10-CM

## 2023-03-26 DIAGNOSIS — C180 Malignant neoplasm of cecum: Secondary | ICD-10-CM | POA: Insufficient documentation

## 2023-03-26 DIAGNOSIS — Z5189 Encounter for other specified aftercare: Secondary | ICD-10-CM | POA: Diagnosis not present

## 2023-03-26 DIAGNOSIS — T829XXA Unspecified complication of cardiac and vascular prosthetic device, implant and graft, initial encounter: Secondary | ICD-10-CM

## 2023-03-26 DIAGNOSIS — I82411 Acute embolism and thrombosis of right femoral vein: Secondary | ICD-10-CM | POA: Insufficient documentation

## 2023-03-26 DIAGNOSIS — Z5111 Encounter for antineoplastic chemotherapy: Secondary | ICD-10-CM | POA: Insufficient documentation

## 2023-03-26 LAB — CMP (CANCER CENTER ONLY)
ALT: 8 U/L (ref 0–44)
AST: 17 U/L (ref 15–41)
Albumin: 3.5 g/dL (ref 3.5–5.0)
Alkaline Phosphatase: 87 U/L (ref 38–126)
Anion gap: 11 (ref 5–15)
BUN: 9 mg/dL (ref 6–20)
CO2: 27 mmol/L (ref 22–32)
Calcium: 9.2 mg/dL (ref 8.9–10.3)
Chloride: 106 mmol/L (ref 98–111)
Creatinine: 0.75 mg/dL (ref 0.44–1.00)
GFR, Estimated: >60 mL/min (ref >60)
Glucose, Bld: 101 mg/dL — ABNORMAL HIGH (ref 70–99)
Potassium: 3.9 mmol/L (ref 3.5–5.1)
Sodium: 144 mmol/L (ref 135–145)
Total Bilirubin: 0.5 mg/dL (ref <1.2)
Total Protein: 6.8 g/dL (ref 6.5–8.1)

## 2023-03-26 LAB — CBC WITH DIFFERENTIAL (CANCER CENTER ONLY)
Abs Immature Granulocytes: 0.02 10*3/uL (ref 0.00–0.07)
Basophils Absolute: 0.1 10*3/uL (ref 0.0–0.1)
Basophils Relative: 1 %
Eosinophils Absolute: 0.1 10*3/uL (ref 0.0–0.5)
Eosinophils Relative: 2 %
HCT: 38.7 % (ref 36.0–46.0)
Hemoglobin: 12.3 g/dL (ref 12.0–15.0)
Immature Granulocytes: 0 %
Lymphocytes Relative: 33 %
Lymphs Abs: 2 10*3/uL (ref 0.7–4.0)
MCH: 29.6 pg (ref 26.0–34.0)
MCHC: 31.8 g/dL (ref 30.0–36.0)
MCV: 93.3 fL (ref 80.0–100.0)
Monocytes Absolute: 1.1 10*3/uL — ABNORMAL HIGH (ref 0.1–1.0)
Monocytes Relative: 18 %
Neutro Abs: 2.8 10*3/uL (ref 1.7–7.7)
Neutrophils Relative %: 46 %
Platelet Count: 313 10*3/uL (ref 150–400)
RBC: 4.15 MIL/uL (ref 3.87–5.11)
RDW: 20.4 % — ABNORMAL HIGH (ref 11.5–15.5)
WBC Count: 6.2 10*3/uL (ref 4.0–10.5)
nRBC: 0 % (ref 0.0–0.2)

## 2023-03-26 NOTE — Patient Instructions (Signed)
PICC Home Care Guide A peripherally inserted central catheter (PICC) is a form of IV access that allows medicines and IV fluids to be quickly put into the blood and spread throughout the body. The PICC is a long, thin, flexible tube (catheter) that is put into a vein in a person's arm or leg. The catheter ends in a large vein just outside the heart called the superior vena cava (SVC). After the PICC is put in, a chest X-ray may be done to make sure that it is in the right place. A PICC may be placed for different reasons, such as: To give medicines and liquid nutrition. To give IV fluids and blood products. To take blood samples often. If there is trouble placing a peripheral intravenous (PIV) catheter. If cared for properly, a PICC can remain in place for many months. Having a PICC can allow you to go home from the hospital sooner and continue treatment at home. Medicines and PICC care can be managed at home by a family member, caregiver, or home health care team. What are the risks? Generally, having a PICC is safe. However, problems may occur, including: A blood clot (thrombus) forming in or at the end of the PICC. A blood clot forming in a vein (deep vein thrombosis) or traveling to the lung (pulmonary embolism). Inflammation of the vein (phlebitis) in which the PICC is placed. Infection at the insertion site or in the blood. Blood infections from central lines, like PICCs, can be serious and often require a hospital stay. PICC malposition, or PICC movement or poor placement. A break or cut in the PICC. Do not use scissors near the PICC. Nerve or tendon irritation or injury during PICC insertion. How to care for your PICC Please follow the specific guidelines provided by your health care provider. Preventing infection You and any caregivers should wash your hands often with soap and water for at least 20 seconds. Wash hands: Before touching the PICC or the infusion device. Before changing a  bandage (dressing). Do not change the dressing unless you have been taught to do so and have shown you are able to change it safely. Flush the PICC as told. Tell your health care provider right away if the PICC is hard to flush or does not flush. Do not use force to flush the PICC. Use clean and germ-free (sterile) supplies only. Keep the supplies in a dry place. Do not reuse needles, syringes, or any other supplies. Reusing supplies can lead to infection. Keep the PICC dressing dry and secure it with tape if the edges stop sticking to your skin. Check your PICC insertion site every day for signs of infection. Check for: Redness, swelling, or pain. Fluid or blood. Warmth. Pus or a bad smell. Preventing other problems Do not use a syringe that is less than 10 mL to flush the PICC. Do not have your blood pressure checked on the arm in which the PICC is placed. Do not ever pull or tug on the PICC. Keep it secured to your arm with tape or a stretch wrap when not in use. Do not take the PICC out yourself. Only a trained health care provider should remove the PICC. Keep pets and children away from your PICC. How to care for your PICC dressing Keep your PICC dressing clean and dry to prevent infection. Do not take baths, swim, or use a hot tub until your health care provider approves. Ask your health care provider if you can take  showers. You may only be allowed to take sponge baths. When you are allowed to shower: Ask your health care provider to teach you how to wrap the PICC. Cover the PICC with clear plastic wrap and tape to keep it dry while showering. Follow instructions from your health care provider about how to take care of your insertion site and dressing. Make sure you: Wash your hands with soap and water for at least 20 seconds before and after you change your dressing. If soap and water are not available, use hand sanitizer. Change your dressing only if taught to do so by your health care  provider. Your PICC dressing needs to be changed if it becomes loose or wet. Leave stitches (sutures), skin glue, or adhesive strips in place. These skin closures may need to stay in place for 2 weeks or longer. If adhesive strip edges start to loosen and curl up, you may trim the loose edges. Do not remove adhesive strips completely unless your health care provider tells you to do that. Follow these instructions at home: Disposal of supplies Throw away any syringes in a disposal container that is meant for sharp items (sharps container). You can buy a sharps container from a pharmacy, or you can make one by using an empty, hard plastic bottle with a lid. Place any used dressings or infusion bags into a plastic bag. Throw that bag in the trash. General instructions  Always carry your PICC identification card or wear a medical alert bracelet. Keep the tube clamped at all times, unless it is being used. Always carry a smooth-edge clamp with you to clamp the PICC if it breaks. Do not use scissors or sharp objects near the tube. You may bend your arm and move it freely. If your PICC is near or at the bend of your elbow, avoid activity with repeated motion at the elbow. Avoid lifting heavy objects as told by your health care provider. Keep all follow-up visits. This is important. You will need to have your PICC dressing changed at least once a week. Contact a health care provider if: You have pain in your arm, ear, face, or teeth. You have a fever or chills. You have redness, swelling, or pain around the insertion site. You have fluid or blood coming from the insertion site. Your insertion site feels warm to the touch. You have pus or a bad smell coming from the insertion site. Your skin feels hard and raised around the insertion site. Your PICC dressing has gotten wet or is coming off and you have not been taught how to change it. Get help right away if: You have problems with your PICC, such as  your PICC: Was tugged or pulled and has partially come out. Do not  push the PICC back in. Cannot be flushed, is hard to flush, or leaks around the insertion site when it is flushed. Makes a flushing sound when it is flushed. Appears to have a hole or tear. Is accidentally pulled all the way out. If this happens, cover the insertion site with a gauze dressing. Do not throw the PICC away. Your health care provider will need to check it to be sure the entire catheter came out. You feel your heart racing or skipping beats, or you have chest pain. You have shortness of breath or trouble breathing. You have swelling, redness, warmth, or pain in the arm in which the PICC is placed. You have a red streak going up your arm that  starts under the PICC dressing. These symptoms may be an emergency. Get help right away. Call 911. Do not wait to see if the symptoms will go away. Do not drive yourself to the hospital. Summary A peripherally inserted central catheter (PICC) is a long, thin, flexible tube (catheter) that is put into a vein in the arm or leg. If cared for properly, a PICC can remain in place for many months. Having a PICC can allow you to go home from the hospital sooner and continue treatment at home. The PICC is inserted using a germ-free (sterile) technique by a specially trained health care provider. Only a trained health care provider should remove it. Do not have your blood pressure checked on the arm in which your PICC is placed. Always keep your PICC identification card with you. This information is not intended to replace advice given to you by your health care provider. Make sure you discuss any questions you have with your health care provider. Document Revised: 10/27/2020 Document Reviewed: 10/27/2020 Elsevier Patient Education  2024 [employer].

## 2023-03-26 NOTE — Progress Notes (Signed)
Pt entered center today for possible treatment today. PICC line dressing  very loose. Complete dressing change completed using sterile tech. PICC has resistance when attached to ns. PICC line not flushed due to resistance. MD informed.

## 2023-03-26 NOTE — Addendum Note (Signed)
Addended by: Clent Jacks on: 03/26/2023 10:36 AM   Modules accepted: Orders

## 2023-03-26 NOTE — Progress Notes (Signed)
Hematology and Oncology Follow Up Visit  Lauren Mcpherson [id number] [birth year] 57 y.o. 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   Today she states that she is feeling well.  She recently had an updated PET scan which showed response to disease but did show an incidental thrombous associated with her PICC line. She is on Xarelto. She has no chest pain, SOB, swelling, calf pain.  Neuropathy of feet and hands is stable and not worsening. She does not want anything for this today No fever, chills, n/v, cough, rash, dizziness, SOB, chest pain, palpitations, abdominal pain or changes in bowel or bladder habits.  No swelling in her extremities.  No falls or syncope reported.  Appetite and hydration are good.  Wt Readings from Last 3 Encounters:  03/26/23 267 lb 1.9 oz (121.2 kg)  03/05/23 266 lb 0.6 oz (120.7 kg)  02/26/23 260 lb 0.6 oz (118 kg)   ECOG Performance Status: 1 - Symptomatic but completely ambulatory  Medications:  Allergies as of 03/26/2023   No Known Allergies      Medication List        Accurate as of March 26, 2023 10:27 AM. If you have any questions, ask your nurse or doctor.          dexamethasone 4 MG tablet Commonly known as: DECADRON Take 2 tablets (8 mg total) by mouth daily. Start the day after chemotherapy for 2 days. Take with  food.   ondansetron 8 MG tablet Commonly known as: Zofran Take 1 tablet (8 mg total) by mouth every 8 (eight) hours as needed for nausea or vomiting. Start on the third day after chemotherapy.   OVER THE COUNTER MEDICATION daily. Nervive - one capsule daily.   prochlorperazine 10 MG tablet Commonly known as: COMPAZINE TAKE 1 TABLET(10 MG) BY MOUTH EVERY 6 HOURS AS NEEDED FOR NAUSEA OR VOMITING   rivaroxaban 10 MG Tabs tablet Commonly known as: XARELTO Take 1 tablet (10 mg total) by mouth daily with supper.        Allergies: No Known Allergies  Past Medical History, Surgical history, Social history, and Family History were reviewed and updated.  Review of Systems: All other 10 point review of systems is negative.   Physical Exam:  height is 5\' 4"  (1.626 m) and weight is 267 lb 1.9 oz (121.2 kg). Her oral temperature is 97.9 F (36.6 C). Her blood pressure is 117/85 and her pulse is 92. Her respiration is 18 and oxygen saturation is 99%.   Wt Readings from Last 3 Encounters:  03/26/23 267 lb 1.9 oz (121.2 kg)  03/05/23 266 lb 0.6 oz (120.7 kg)  02/26/23 260 lb 0.6 oz (118 kg)    Ocular: Sclerae unicteric, pupils equal, round and reactive to light Ear-nose-throat: Oropharynx clear, dentition fair Lymphatic: No cervical or supraclavicular adenopathy Lungs  no rales or rhonchi, good excursion bilaterally Heart regular rate and rhythm, no murmur appreciated Abd soft, nontender, positive bowel sounds MSK no focal spinal tenderness, no joint edema Neuro: non-focal, well-oriented, appropriate affect   Lab Results  Component Value Date   WBC 6.2 03/26/2023   HGB 12.3 03/26/2023   HCT 38.7 03/26/2023   MCV 93.3 03/26/2023   PLT 313 03/26/2023   Lab Results  Component Value Date   FERRITIN 726 (H) 02/26/2023   IRON 66 02/26/2023   TIBC 300 02/26/2023   UIBC 234 02/26/2023   IRONPCTSAT 22 02/26/2023   Lab Results  Component Value Date   RETICCTPCT 1.5 12/14/2021    RBC 4.15 03/26/2023   No results found for: "KPAFRELGTCHN", "LAMBDASER", "KAPLAMBRATIO" No results found for: "IGGSERUM", "IGA", "IGMSERUM" No results found for: "TOTALPROTELP", "ALBUMINELP", "A1GS", "A2GS", "BETS", "BETA2SER", "GAMS", "MSPIKE", "SPEI"   Chemistry      Component Value Date/Time   NA 144 03/26/2023 0930   K 3.9 03/26/2023 0930   CL 106 03/26/2023 0930   CO2 27 03/26/2023 0930   BUN 9 03/26/2023 0930   CREATININE 0.75 03/26/2023 0930      Component Value Date/Time   CALCIUM 9.2 03/26/2023 0930   ALKPHOS 87 03/26/2023 0930   AST 17 03/26/2023 0930   ALT 8 03/26/2023 0930   BILITOT 0.5 03/26/2023 0930     Encounter Diagnoses  Name Primary?   PICC (peripherally inserted central catheter) in place Yes   Complication associated with peripherally inserted central catheter (PICC), initial encounter     Impression and Plan: Lauren Mcpherson is a very pleasant 57 yo African American female with metastatic 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   She is here for consideration of cycle 6 of FOLFOX/Avastin.  Given thrombus and poorly working PICC line, we are going to hold treatment today. We will keep her on the xaretlo and refer her to IR this week. Plan discussed and agreed upon by Dr. Myna Hidalgo RTC 7 days MD, PICC labs, Cycle 6 FOLFOX/Avastin RTC 9 days pump removal    Continue PICC flush 3 days a week, MWF  Rushie Chestnut, PA-C 12/2/202410:27 AM

## 2023-03-26 NOTE — Addendum Note (Signed)
Addended by: Clent Jacks on: 03/26/2023 10:35 AM   Modules accepted: Orders

## 2023-03-27 ENCOUNTER — Inpatient Hospital Stay (HOSPITAL_COMMUNITY)
Admission: RE | Admit: 2023-03-27 | Discharge: 2023-03-27 | Discharge status: Home / Self Care | Payer: No Typology Code available for payment source | Source: Ambulatory Visit | Attending: Medical Oncology | Admitting: Medical Oncology

## 2023-03-27 ENCOUNTER — Other Ambulatory Visit: Payer: Self-pay | Admitting: Medical Oncology

## 2023-03-27 DIAGNOSIS — I871 Compression of vein: Secondary | ICD-10-CM | POA: Diagnosis not present

## 2023-03-27 DIAGNOSIS — Z452 Encounter for adjustment and management of vascular access device: Secondary | ICD-10-CM

## 2023-03-27 DIAGNOSIS — T829XXA Unspecified complication of cardiac and vascular prosthetic device, implant and graft, initial encounter: Secondary | ICD-10-CM

## 2023-03-27 DIAGNOSIS — Y839 Surgical procedure, unspecified as the cause of abnormal reaction of the patient, or of later complication, without mention of misadventure at the time of the procedure: Secondary | ICD-10-CM | POA: Diagnosis not present

## 2023-03-27 DIAGNOSIS — T82514A Breakdown (mechanical) of infusion catheter, initial encounter: Secondary | ICD-10-CM | POA: Insufficient documentation

## 2023-03-27 HISTORY — PX: IR VENO/EXT/UNI RIGHT: IMG676

## 2023-03-27 HISTORY — PX: IR PTA VENOUS EXCEPT DIALYSIS CIRCUIT: IMG6126

## 2023-03-27 MED ORDER — HEPARIN SOD (PORK) LOCK FLUSH 100 UNIT/ML IV SOLN
500.0000 [IU] | Freq: Once | INTRAVENOUS | Status: AC
  Administered 2023-03-27: 500 [IU] via INTRAVENOUS

## 2023-03-27 MED ORDER — LIDOCAINE HCL 1 % IJ SOLN
20.0000 mL | Freq: Once | INTRAMUSCULAR | Status: AC
  Administered 2023-03-27: 1 mL via INTRADERMAL

## 2023-03-27 MED ORDER — IOHEXOL 300 MG/ML  SOLN
50.0000 mL | Freq: Once | INTRAMUSCULAR | Status: AC | PRN
  Administered 2023-03-27: 50 mL

## 2023-03-27 MED ORDER — LIDOCAINE HCL 1 % IJ SOLN
INTRAMUSCULAR | Status: AC
  Filled 2023-03-27: qty 20

## 2023-03-27 MED ORDER — HEPARIN SOD (PORK) LOCK FLUSH 100 UNIT/ML IV SOLN
INTRAVENOUS | Status: AC
  Filled 2023-03-27: qty 5

## 2023-03-28 ENCOUNTER — Inpatient Hospital Stay: Payer: No Typology Code available for payment source

## 2023-03-28 VITALS — BP 140/89 | HR 102 | Temp 97.7°F | Resp 18

## 2023-03-28 DIAGNOSIS — C787 Secondary malignant neoplasm of liver and intrahepatic bile duct: Secondary | ICD-10-CM

## 2023-03-28 DIAGNOSIS — Z5111 Encounter for antineoplastic chemotherapy: Secondary | ICD-10-CM | POA: Diagnosis not present

## 2023-03-28 MED ORDER — SODIUM CHLORIDE 0.9% FLUSH
10.0000 mL | Freq: Once | INTRAVENOUS | Status: AC
  Administered 2023-03-28: 10 mL via INTRAVENOUS

## 2023-03-28 MED ORDER — HEPARIN SOD (PORK) LOCK FLUSH 100 UNIT/ML IV SOLN
500.0000 [IU] | Freq: Once | INTRAVENOUS | Status: AC
  Administered 2023-03-28: 500 [IU] via INTRAVENOUS

## 2023-03-28 NOTE — Patient Instructions (Signed)
PICC Home Care Guide A peripherally inserted central catheter (PICC) is a form of IV access that allows medicines and IV fluids to be quickly put into the blood and spread throughout the body. The PICC is a long, thin, flexible tube (catheter) that is put into a vein in a person's arm or leg. The catheter ends in a large vein just outside the heart called the superior vena cava (SVC). After the PICC is put in, a chest X-ray may be done to make sure that it is in the right place. A PICC may be placed for different reasons, such as: To give medicines and liquid nutrition. To give IV fluids and blood products. To take blood samples often. If there is trouble placing a peripheral intravenous (PIV) catheter. If cared for properly, a PICC can remain in place for many months. Having a PICC can allow you to go home from the hospital sooner and continue treatment at home. Medicines and PICC care can be managed at home by a family member, caregiver, or home health care team. What are the risks? Generally, having a PICC is safe. However, problems may occur, including: A blood clot (thrombus) forming in or at the end of the PICC. A blood clot forming in a vein (deep vein thrombosis) or traveling to the lung (pulmonary embolism). Inflammation of the vein (phlebitis) in which the PICC is placed. Infection at the insertion site or in the blood. Blood infections from central lines, like PICCs, can be serious and often require a hospital stay. PICC malposition, or PICC movement or poor placement. A break or cut in the PICC. Do not use scissors near the PICC. Nerve or tendon irritation or injury during PICC insertion. How to care for your PICC Please follow the specific guidelines provided by your health care provider. Preventing infection You and any caregivers should wash your hands often with soap and water for at least 20 seconds. Wash hands: Before touching the PICC or the infusion device. Before changing a  bandage (dressing). Do not change the dressing unless you have been taught to do so and have shown you are able to change it safely. Flush the PICC as told. Tell your health care provider right away if the PICC is hard to flush or does not flush. Do not use force to flush the PICC. Use clean and germ-free (sterile) supplies only. Keep the supplies in a dry place. Do not reuse needles, syringes, or any other supplies. Reusing supplies can lead to infection. Keep the PICC dressing dry and secure it with tape if the edges stop sticking to your skin. Check your PICC insertion site every day for signs of infection. Check for: Redness, swelling, or pain. Fluid or blood. Warmth. Pus or a bad smell. Preventing other problems Do not use a syringe that is less than 10 mL to flush the PICC. Do not have your blood pressure checked on the arm in which the PICC is placed. Do not ever pull or tug on the PICC. Keep it secured to your arm with tape or a stretch wrap when not in use. Do not take the PICC out yourself. Only a trained health care provider should remove the PICC. Keep pets and children away from your PICC. How to care for your PICC dressing Keep your PICC dressing clean and dry to prevent infection. Do not take baths, swim, or use a hot tub until your health care provider approves. Ask your health care provider if you can take  showers. You may only be allowed to take sponge baths. When you are allowed to shower: Ask your health care provider to teach you how to wrap the PICC. Cover the PICC with clear plastic wrap and tape to keep it dry while showering. Follow instructions from your health care provider about how to take care of your insertion site and dressing. Make sure you: Wash your hands with soap and water for at least 20 seconds before and after you change your dressing. If soap and water are not available, use hand sanitizer. Change your dressing only if taught to do so by your health care  provider. Your PICC dressing needs to be changed if it becomes loose or wet. Leave stitches (sutures), skin glue, or adhesive strips in place. These skin closures may need to stay in place for 2 weeks or longer. If adhesive strip edges start to loosen and curl up, you may trim the loose edges. Do not remove adhesive strips completely unless your health care provider tells you to do that. Follow these instructions at home: Disposal of supplies Throw away any syringes in a disposal container that is meant for sharp items (sharps container). You can buy a sharps container from a pharmacy, or you can make one by using an empty, hard plastic bottle with a lid. Place any used dressings or infusion bags into a plastic bag. Throw that bag in the trash. General instructions  Always carry your PICC identification card or wear a medical alert bracelet. Keep the tube clamped at all times, unless it is being used. Always carry a smooth-edge clamp with you to clamp the PICC if it breaks. Do not use scissors or sharp objects near the tube. You may bend your arm and move it freely. If your PICC is near or at the bend of your elbow, avoid activity with repeated motion at the elbow. Avoid lifting heavy objects as told by your health care provider. Keep all follow-up visits. This is important. You will need to have your PICC dressing changed at least once a week. Contact a health care provider if: You have pain in your arm, ear, face, or teeth. You have a fever or chills. You have redness, swelling, or pain around the insertion site. You have fluid or blood coming from the insertion site. Your insertion site feels warm to the touch. You have pus or a bad smell coming from the insertion site. Your skin feels hard and raised around the insertion site. Your PICC dressing has gotten wet or is coming off and you have not been taught how to change it. Get help right away if: You have problems with your PICC, such as  your PICC: Was tugged or pulled and has partially come out. Do not  push the PICC back in. Cannot be flushed, is hard to flush, or leaks around the insertion site when it is flushed. Makes a flushing sound when it is flushed. Appears to have a hole or tear. Is accidentally pulled all the way out. If this happens, cover the insertion site with a gauze dressing. Do not throw the PICC away. Your health care provider will need to check it to be sure the entire catheter came out. You feel your heart racing or skipping beats, or you have chest pain. You have shortness of breath or trouble breathing. You have swelling, redness, warmth, or pain in the arm in which the PICC is placed. You have a red streak going up your arm that  starts under the PICC dressing. These symptoms may be an emergency. Get help right away. Call 911. Do not wait to see if the symptoms will go away. Do not drive yourself to the hospital. Summary A peripherally inserted central catheter (PICC) is a long, thin, flexible tube (catheter) that is put into a vein in the arm or leg. If cared for properly, a PICC can remain in place for many months. Having a PICC can allow you to go home from the hospital sooner and continue treatment at home. The PICC is inserted using a germ-free (sterile) technique by a specially trained health care provider. Only a trained health care provider should remove it. Do not have your blood pressure checked on the arm in which your PICC is placed. Always keep your PICC identification card with you. This information is not intended to replace advice given to you by your health care provider. Make sure you discuss any questions you have with your health care provider. Document Revised: 10/27/2020 Document Reviewed: 10/27/2020 Elsevier Patient Education  2024 [employer].

## 2023-03-29 ENCOUNTER — Other Ambulatory Visit: Payer: Self-pay

## 2023-03-30 ENCOUNTER — Inpatient Hospital Stay: Payer: No Typology Code available for payment source

## 2023-03-30 ENCOUNTER — Other Ambulatory Visit: Payer: Self-pay

## 2023-03-30 DIAGNOSIS — Z5111 Encounter for antineoplastic chemotherapy: Secondary | ICD-10-CM | POA: Diagnosis not present

## 2023-03-30 DIAGNOSIS — C787 Secondary malignant neoplasm of liver and intrahepatic bile duct: Secondary | ICD-10-CM

## 2023-03-30 NOTE — Patient Instructions (Signed)
PICC Home Care Guide A peripherally inserted central catheter (PICC) is a form of IV access that allows medicines and IV fluids to be quickly put into the blood and spread throughout the body. The PICC is a long, thin, flexible tube (catheter) that is put into a vein in a person's arm or leg. The catheter ends in a large vein just outside the heart called the superior vena cava (SVC). After the PICC is put in, a chest X-ray may be done to make sure that it is in the right place. A PICC may be placed for different reasons, such as: To give medicines and liquid nutrition. To give IV fluids and blood products. To take blood samples often. If there is trouble placing a peripheral intravenous (PIV) catheter. If cared for properly, a PICC can remain in place for many months. Having a PICC can allow you to go home from the hospital sooner and continue treatment at home. Medicines and PICC care can be managed at home by a family member, caregiver, or home health care team. What are the risks? Generally, having a PICC is safe. However, problems may occur, including: A blood clot (thrombus) forming in or at the end of the PICC. A blood clot forming in a vein (deep vein thrombosis) or traveling to the lung (pulmonary embolism). Inflammation of the vein (phlebitis) in which the PICC is placed. Infection at the insertion site or in the blood. Blood infections from central lines, like PICCs, can be serious and often require a hospital stay. PICC malposition, or PICC movement or poor placement. A break or cut in the PICC. Do not use scissors near the PICC. Nerve or tendon irritation or injury during PICC insertion. How to care for your PICC Please follow the specific guidelines provided by your health care provider. Preventing infection You and any caregivers should wash your hands often with soap and water for at least 20 seconds. Wash hands: Before touching the PICC or the infusion device. Before changing a  bandage (dressing). Do not change the dressing unless you have been taught to do so and have shown you are able to change it safely. Flush the PICC as told. Tell your health care provider right away if the PICC is hard to flush or does not flush. Do not use force to flush the PICC. Use clean and germ-free (sterile) supplies only. Keep the supplies in a dry place. Do not reuse needles, syringes, or any other supplies. Reusing supplies can lead to infection. Keep the PICC dressing dry and secure it with tape if the edges stop sticking to your skin. Check your PICC insertion site every day for signs of infection. Check for: Redness, swelling, or pain. Fluid or blood. Warmth. Pus or a bad smell. Preventing other problems Do not use a syringe that is less than 10 mL to flush the PICC. Do not have your blood pressure checked on the arm in which the PICC is placed. Do not ever pull or tug on the PICC. Keep it secured to your arm with tape or a stretch wrap when not in use. Do not take the PICC out yourself. Only a trained health care provider should remove the PICC. Keep pets and children away from your PICC. How to care for your PICC dressing Keep your PICC dressing clean and dry to prevent infection. Do not take baths, swim, or use a hot tub until your health care provider approves. Ask your health care provider if you can take  showers. You may only be allowed to take sponge baths. When you are allowed to shower: Ask your health care provider to teach you how to wrap the PICC. Cover the PICC with clear plastic wrap and tape to keep it dry while showering. Follow instructions from your health care provider about how to take care of your insertion site and dressing. Make sure you: Wash your hands with soap and water for at least 20 seconds before and after you change your dressing. If soap and water are not available, use hand sanitizer. Change your dressing only if taught to do so by your health care  provider. Your PICC dressing needs to be changed if it becomes loose or wet. Leave stitches (sutures), skin glue, or adhesive strips in place. These skin closures may need to stay in place for 2 weeks or longer. If adhesive strip edges start to loosen and curl up, you may trim the loose edges. Do not remove adhesive strips completely unless your health care provider tells you to do that. Follow these instructions at home: Disposal of supplies Throw away any syringes in a disposal container that is meant for sharp items (sharps container). You can buy a sharps container from a pharmacy, or you can make one by using an empty, hard plastic bottle with a lid. Place any used dressings or infusion bags into a plastic bag. Throw that bag in the trash. General instructions  Always carry your PICC identification card or wear a medical alert bracelet. Keep the tube clamped at all times, unless it is being used. Always carry a smooth-edge clamp with you to clamp the PICC if it breaks. Do not use scissors or sharp objects near the tube. You may bend your arm and move it freely. If your PICC is near or at the bend of your elbow, avoid activity with repeated motion at the elbow. Avoid lifting heavy objects as told by your health care provider. Keep all follow-up visits. This is important. You will need to have your PICC dressing changed at least once a week. Contact a health care provider if: You have pain in your arm, ear, face, or teeth. You have a fever or chills. You have redness, swelling, or pain around the insertion site. You have fluid or blood coming from the insertion site. Your insertion site feels warm to the touch. You have pus or a bad smell coming from the insertion site. Your skin feels hard and raised around the insertion site. Your PICC dressing has gotten wet or is coming off and you have not been taught how to change it. Get help right away if: You have problems with your PICC, such as  your PICC: Was tugged or pulled and has partially come out. Do not  push the PICC back in. Cannot be flushed, is hard to flush, or leaks around the insertion site when it is flushed. Makes a flushing sound when it is flushed. Appears to have a hole or tear. Is accidentally pulled all the way out. If this happens, cover the insertion site with a gauze dressing. Do not throw the PICC away. Your health care provider will need to check it to be sure the entire catheter came out. You feel your heart racing or skipping beats, or you have chest pain. You have shortness of breath or trouble breathing. You have swelling, redness, warmth, or pain in the arm in which the PICC is placed. You have a red streak going up your arm that  starts under the PICC dressing. These symptoms may be an emergency. Get help right away. Call 911. Do not wait to see if the symptoms will go away. Do not drive yourself to the hospital. Summary A peripherally inserted central catheter (PICC) is a long, thin, flexible tube (catheter) that is put into a vein in the arm or leg. If cared for properly, a PICC can remain in place for many months. Having a PICC can allow you to go home from the hospital sooner and continue treatment at home. The PICC is inserted using a germ-free (sterile) technique by a specially trained health care provider. Only a trained health care provider should remove it. Do not have your blood pressure checked on the arm in which your PICC is placed. Always keep your PICC identification card with you. This information is not intended to replace advice given to you by your health care provider. Make sure you discuss any questions you have with your health care provider. Document Revised: 10/27/2020 Document Reviewed: 10/27/2020 Elsevier Patient Education  2024 [employer].

## 2023-04-02 ENCOUNTER — Inpatient Hospital Stay: Payer: No Typology Code available for payment source

## 2023-04-02 VITALS — BP 124/81 | HR 78 | Temp 97.9°F | Resp 18

## 2023-04-02 DIAGNOSIS — C787 Secondary malignant neoplasm of liver and intrahepatic bile duct: Secondary | ICD-10-CM

## 2023-04-02 DIAGNOSIS — Z5111 Encounter for antineoplastic chemotherapy: Secondary | ICD-10-CM | POA: Diagnosis not present

## 2023-04-02 DIAGNOSIS — C180 Malignant neoplasm of cecum: Secondary | ICD-10-CM

## 2023-04-02 LAB — CMP (CANCER CENTER ONLY)
ALT: 8 U/L (ref 0–44)
AST: 18 U/L (ref 15–41)
Albumin: 3.7 g/dL (ref 3.5–5.0)
Alkaline Phosphatase: 85 U/L (ref 38–126)
Anion gap: 9 (ref 5–15)
BUN: 9 mg/dL (ref 6–20)
CO2: 28 mmol/L (ref 22–32)
Calcium: 9.5 mg/dL (ref 8.9–10.3)
Chloride: 104 mmol/L (ref 98–111)
Creatinine: 0.74 mg/dL (ref 0.44–1.00)
GFR, Estimated: >60 mL/min (ref >60)
Glucose, Bld: 100 mg/dL — ABNORMAL HIGH (ref 70–99)
Potassium: 3.6 mmol/L (ref 3.5–5.1)
Sodium: 141 mmol/L (ref 135–145)
Total Bilirubin: 0.5 mg/dL (ref <1.2)
Total Protein: 7.1 g/dL (ref 6.5–8.1)

## 2023-04-02 LAB — CBC WITH DIFFERENTIAL (CANCER CENTER ONLY)
Abs Immature Granulocytes: 0.01 10*3/uL (ref 0.00–0.07)
Basophils Absolute: 0.1 10*3/uL (ref 0.0–0.1)
Basophils Relative: 1 %
Eosinophils Absolute: 0.2 10*3/uL (ref 0.0–0.5)
Eosinophils Relative: 3 %
HCT: 38.5 % (ref 36.0–46.0)
Hemoglobin: 12.2 g/dL (ref 12.0–15.0)
Immature Granulocytes: 0 %
Lymphocytes Relative: 36 %
Lymphs Abs: 1.8 10*3/uL (ref 0.7–4.0)
MCH: 29.8 pg (ref 26.0–34.0)
MCHC: 31.7 g/dL (ref 30.0–36.0)
MCV: 94.1 fL (ref 80.0–100.0)
Monocytes Absolute: 0.6 10*3/uL (ref 0.1–1.0)
Monocytes Relative: 13 %
Neutro Abs: 2.4 10*3/uL (ref 1.7–7.7)
Neutrophils Relative %: 47 %
Platelet Count: 299 10*3/uL (ref 150–400)
RBC: 4.09 MIL/uL (ref 3.87–5.11)
RDW: 19.2 % — ABNORMAL HIGH (ref 11.5–15.5)
WBC Count: 5 10*3/uL (ref 4.0–10.5)
nRBC: 0 % (ref 0.0–0.2)

## 2023-04-02 LAB — IRON AND IRON BINDING CAPACITY (CC-WL,HP ONLY)
Iron: 71 ug/dL (ref 28–170)
Saturation Ratios: 26 % (ref 10.4–31.8)
TIBC: 273 ug/dL (ref 250–450)
UIBC: 202 ug/dL (ref 148–442)

## 2023-04-02 LAB — CEA (IN HOUSE-CHCC): CEA (CHCC-In House): 140.67 ng/mL — ABNORMAL HIGH (ref 0.00–5.00)

## 2023-04-02 LAB — FERRITIN: Ferritin: 462 ng/mL — ABNORMAL HIGH (ref 11–307)

## 2023-04-02 MED ORDER — DEXTROSE 5 % IV SOLN
Freq: Once | INTRAVENOUS | Status: DC
Start: 2023-04-02 — End: 2023-04-02

## 2023-04-02 MED ORDER — DEXTROSE 5 % IV SOLN
Freq: Once | INTRAVENOUS | Status: AC
Start: 2023-04-02 — End: 2023-04-02

## 2023-04-02 MED ORDER — FLUOROURACIL CHEMO INJECTION 2.5 GM/50ML
400.0000 mg/m2 | Freq: Once | INTRAVENOUS | Status: AC
Start: 2023-04-02 — End: 2023-04-02
  Administered 2023-04-02: 1000 mg via INTRAVENOUS
  Filled 2023-04-02: qty 20

## 2023-04-02 MED ORDER — HEPARIN SOD (PORK) LOCK FLUSH 100 UNIT/ML IV SOLN: 500.0000 [IU] | Freq: Once | INTRAVENOUS | Status: DC | PRN

## 2023-04-02 MED ORDER — DEXAMETHASONE SODIUM PHOSPHATE 10 MG/ML IJ SOLN
10.0000 mg | Freq: Once | INTRAMUSCULAR | Status: AC
  Administered 2023-04-02: 10 mg via INTRAVENOUS
  Filled 2023-04-02: qty 1

## 2023-04-02 MED ORDER — OXALIPLATIN CHEMO INJECTION 100 MG/20ML
85.0000 mg/m2 | Freq: Once | INTRAVENOUS | Status: AC
  Administered 2023-04-02: 200 mg via INTRAVENOUS
  Filled 2023-04-02: qty 40

## 2023-04-02 MED ORDER — SODIUM CHLORIDE 0.9 % IV SOLN
5.0000 mg/kg | Freq: Once | INTRAVENOUS | Status: AC
  Administered 2023-04-02: 600 mg via INTRAVENOUS
  Filled 2023-04-02: qty 16

## 2023-04-02 MED ORDER — SODIUM CHLORIDE 0.9 % IV SOLN
2400.0000 mg/m2 | INTRAVENOUS | Status: DC
  Administered 2023-04-02: 5550 mg via INTRAVENOUS
  Filled 2023-04-02: qty 111

## 2023-04-02 MED ORDER — LEUCOVORIN CALCIUM INJECTION 350 MG
400.0000 mg/m2 | Freq: Once | INTRAVENOUS | Status: AC
  Administered 2023-04-02: 928 mg via INTRAVENOUS
  Filled 2023-04-02: qty 46.4

## 2023-04-02 MED ORDER — SODIUM CHLORIDE 0.9% FLUSH: 10.0000 mL | INTRAVENOUS | Status: DC | PRN

## 2023-04-02 MED ORDER — SODIUM CHLORIDE 0.9 % IV SOLN
Freq: Once | INTRAVENOUS | Status: AC
Start: 2023-04-02 — End: 2023-04-02

## 2023-04-02 MED ORDER — PALONOSETRON HCL INJECTION 0.25 MG/5ML
0.2500 mg | Freq: Once | INTRAVENOUS | Status: AC
  Administered 2023-04-02: 0.25 mg via INTRAVENOUS
  Filled 2023-04-02: qty 5

## 2023-04-02 NOTE — Patient Instructions (Signed)

## 2023-04-04 ENCOUNTER — Inpatient Hospital Stay: Payer: No Typology Code available for payment source

## 2023-04-04 VITALS — BP 137/72 | HR 77 | Temp 97.9°F | Resp 16

## 2023-04-04 DIAGNOSIS — Z5111 Encounter for antineoplastic chemotherapy: Secondary | ICD-10-CM | POA: Diagnosis not present

## 2023-04-04 DIAGNOSIS — C180 Malignant neoplasm of cecum: Secondary | ICD-10-CM

## 2023-04-04 MED ORDER — HEPARIN SOD (PORK) LOCK FLUSH 100 UNIT/ML IV SOLN
500.0000 [IU] | Freq: Once | INTRAVENOUS | Status: AC | PRN
  Administered 2023-04-04: 500 [IU]

## 2023-04-04 MED ORDER — SODIUM CHLORIDE 0.9% FLUSH
10.0000 mL | INTRAVENOUS | Status: DC | PRN
  Administered 2023-04-04: 10 mL

## 2023-04-04 MED ORDER — PEGFILGRASTIM-JMDB 6 MG/0.6ML ~~LOC~~ SOSY
6.0000 mg | PREFILLED_SYRINGE | Freq: Once | SUBCUTANEOUS | Status: AC
  Administered 2023-04-04: 6 mg via SUBCUTANEOUS
  Filled 2023-04-04: qty 0.6

## 2023-04-04 NOTE — Patient Instructions (Signed)

## 2023-04-06 ENCOUNTER — Inpatient Hospital Stay: Payer: No Typology Code available for payment source

## 2023-04-06 VITALS — BP 121/80 | HR 101 | Temp 98.0°F | Resp 18

## 2023-04-06 DIAGNOSIS — Z5111 Encounter for antineoplastic chemotherapy: Secondary | ICD-10-CM | POA: Diagnosis not present

## 2023-04-06 DIAGNOSIS — C180 Malignant neoplasm of cecum: Secondary | ICD-10-CM

## 2023-04-06 MED ORDER — SODIUM CHLORIDE 0.9% FLUSH
10.0000 mL | Freq: Once | INTRAVENOUS | Status: AC
  Administered 2023-04-06: 10 mL

## 2023-04-06 MED ORDER — HEPARIN SOD (PORK) LOCK FLUSH 100 UNIT/ML IV SOLN
250.0000 [IU] | Freq: Once | INTRAVENOUS | Status: AC
  Administered 2023-04-06: 250 [IU]

## 2023-04-06 NOTE — Patient Instructions (Signed)
PICC Home Care Guide A peripherally inserted central catheter (PICC) is a form of IV access that allows medicines and IV fluids to be quickly put into the blood and spread throughout the body. The PICC is a long, thin, flexible tube (catheter) that is put into a vein in a person's arm or leg. The catheter ends in a large vein just outside the heart called the superior vena cava (SVC). After the PICC is put in, a chest X-ray may be done to make sure that it is in the right place. A PICC may be placed for different reasons, such as: To give medicines and liquid nutrition. To give IV fluids and blood products. To take blood samples often. If there is trouble placing a peripheral intravenous (PIV) catheter. If cared for properly, a PICC can remain in place for many months. Having a PICC can allow you to go home from the hospital sooner and continue treatment at home. Medicines and PICC care can be managed at home by a family member, caregiver, or home health care team. What are the risks? Generally, having a PICC is safe. However, problems may occur, including: A blood clot (thrombus) forming in or at the end of the PICC. A blood clot forming in a vein (deep vein thrombosis) or traveling to the lung (pulmonary embolism). Inflammation of the vein (phlebitis) in which the PICC is placed. Infection at the insertion site or in the blood. Blood infections from central lines, like PICCs, can be serious and often require a hospital stay. PICC malposition, or PICC movement or poor placement. A break or cut in the PICC. Do not use scissors near the PICC. Nerve or tendon irritation or injury during PICC insertion. How to care for your PICC Please follow the specific guidelines provided by your health care provider. Preventing infection You and any caregivers should wash your hands often with soap and water for at least 20 seconds. Wash hands: Before touching the PICC or the infusion device. Before changing a  bandage (dressing). Do not change the dressing unless you have been taught to do so and have shown you are able to change it safely. Flush the PICC as told. Tell your health care provider right away if the PICC is hard to flush or does not flush. Do not use force to flush the PICC. Use clean and germ-free (sterile) supplies only. Keep the supplies in a dry place. Do not reuse needles, syringes, or any other supplies. Reusing supplies can lead to infection. Keep the PICC dressing dry and secure it with tape if the edges stop sticking to your skin. Check your PICC insertion site every day for signs of infection. Check for: Redness, swelling, or pain. Fluid or blood. Warmth. Pus or a bad smell. Preventing other problems Do not use a syringe that is less than 10 mL to flush the PICC. Do not have your blood pressure checked on the arm in which the PICC is placed. Do not ever pull or tug on the PICC. Keep it secured to your arm with tape or a stretch wrap when not in use. Do not take the PICC out yourself. Only a trained health care provider should remove the PICC. Keep pets and children away from your PICC. How to care for your PICC dressing Keep your PICC dressing clean and dry to prevent infection. Do not take baths, swim, or use a hot tub until your health care provider approves. Ask your health care provider if you can take  showers. You may only be allowed to take sponge baths. When you are allowed to shower: Ask your health care provider to teach you how to wrap the PICC. Cover the PICC with clear plastic wrap and tape to keep it dry while showering. Follow instructions from your health care provider about how to take care of your insertion site and dressing. Make sure you: Wash your hands with soap and water for at least 20 seconds before and after you change your dressing. If soap and water are not available, use hand sanitizer. Change your dressing only if taught to do so by your health care  provider. Your PICC dressing needs to be changed if it becomes loose or wet. Leave stitches (sutures), skin glue, or adhesive strips in place. These skin closures may need to stay in place for 2 weeks or longer. If adhesive strip edges start to loosen and curl up, you may trim the loose edges. Do not remove adhesive strips completely unless your health care provider tells you to do that. Follow these instructions at home: Disposal of supplies Throw away any syringes in a disposal container that is meant for sharp items (sharps container). You can buy a sharps container from a pharmacy, or you can make one by using an empty, hard plastic bottle with a lid. Place any used dressings or infusion bags into a plastic bag. Throw that bag in the trash. General instructions  Always carry your PICC identification card or wear a medical alert bracelet. Keep the tube clamped at all times, unless it is being used. Always carry a smooth-edge clamp with you to clamp the PICC if it breaks. Do not use scissors or sharp objects near the tube. You may bend your arm and move it freely. If your PICC is near or at the bend of your elbow, avoid activity with repeated motion at the elbow. Avoid lifting heavy objects as told by your health care provider. Keep all follow-up visits. This is important. You will need to have your PICC dressing changed at least once a week. Contact a health care provider if: You have pain in your arm, ear, face, or teeth. You have a fever or chills. You have redness, swelling, or pain around the insertion site. You have fluid or blood coming from the insertion site. Your insertion site feels warm to the touch. You have pus or a bad smell coming from the insertion site. Your skin feels hard and raised around the insertion site. Your PICC dressing has gotten wet or is coming off and you have not been taught how to change it. Get help right away if: You have problems with your PICC, such as  your PICC: Was tugged or pulled and has partially come out. Do not  push the PICC back in. Cannot be flushed, is hard to flush, or leaks around the insertion site when it is flushed. Makes a flushing sound when it is flushed. Appears to have a hole or tear. Is accidentally pulled all the way out. If this happens, cover the insertion site with a gauze dressing. Do not throw the PICC away. Your health care provider will need to check it to be sure the entire catheter came out. You feel your heart racing or skipping beats, or you have chest pain. You have shortness of breath or trouble breathing. You have swelling, redness, warmth, or pain in the arm in which the PICC is placed. You have a red streak going up your arm that  starts under the PICC dressing. These symptoms may be an emergency. Get help right away. Call 911. Do not wait to see if the symptoms will go away. Do not drive yourself to the hospital. Summary A peripherally inserted central catheter (PICC) is a long, thin, flexible tube (catheter) that is put into a vein in the arm or leg. If cared for properly, a PICC can remain in place for many months. Having a PICC can allow you to go home from the hospital sooner and continue treatment at home. The PICC is inserted using a germ-free (sterile) technique by a specially trained health care provider. Only a trained health care provider should remove it. Do not have your blood pressure checked on the arm in which your PICC is placed. Always keep your PICC identification card with you. This information is not intended to replace advice given to you by your health care provider. Make sure you discuss any questions you have with your health care provider. Document Revised: 10/27/2020 Document Reviewed: 10/27/2020 Elsevier Patient Education  2024 [employer].

## 2023-04-07 ENCOUNTER — Other Ambulatory Visit: Payer: Self-pay

## 2023-04-09 ENCOUNTER — Inpatient Hospital Stay: Payer: No Typology Code available for payment source

## 2023-04-09 VITALS — BP 123/86 | HR 87 | Temp 97.9°F | Resp 18

## 2023-04-09 DIAGNOSIS — Z5111 Encounter for antineoplastic chemotherapy: Secondary | ICD-10-CM | POA: Diagnosis not present

## 2023-04-09 DIAGNOSIS — C180 Malignant neoplasm of cecum: Secondary | ICD-10-CM

## 2023-04-09 MED ORDER — HEPARIN SOD (PORK) LOCK FLUSH 100 UNIT/ML IV SOLN
500.0000 [IU] | Freq: Once | INTRAVENOUS | Status: AC
  Administered 2023-04-09: 500 [IU] via INTRAVENOUS

## 2023-04-09 MED ORDER — SODIUM CHLORIDE 0.9% FLUSH
10.0000 mL | INTRAVENOUS | Status: DC | PRN
  Administered 2023-04-09: 10 mL via INTRAVENOUS

## 2023-04-09 NOTE — Patient Instructions (Signed)
PICC Home Care Guide A peripherally inserted central catheter (PICC) is a form of IV access that allows medicines and IV fluids to be quickly put into the blood and spread throughout the body. The PICC is a long, thin, flexible tube (catheter) that is put into a vein in a person's arm or leg. The catheter ends in a large vein just outside the heart called the superior vena cava (SVC). After the PICC is put in, a chest X-ray may be done to make sure that it is in the right place. A PICC may be placed for different reasons, such as: To give medicines and liquid nutrition. To give IV fluids and blood products. To take blood samples often. If there is trouble placing a peripheral intravenous (PIV) catheter. If cared for properly, a PICC can remain in place for many months. Having a PICC can allow you to go home from the hospital sooner and continue treatment at home. Medicines and PICC care can be managed at home by a family member, caregiver, or home health care team. What are the risks? Generally, having a PICC is safe. However, problems may occur, including: A blood clot (thrombus) forming in or at the end of the PICC. A blood clot forming in a vein (deep vein thrombosis) or traveling to the lung (pulmonary embolism). Inflammation of the vein (phlebitis) in which the PICC is placed. Infection at the insertion site or in the blood. Blood infections from central lines, like PICCs, can be serious and often require a hospital stay. PICC malposition, or PICC movement or poor placement. A break or cut in the PICC. Do not use scissors near the PICC. Nerve or tendon irritation or injury during PICC insertion. How to care for your PICC Please follow the specific guidelines provided by your health care provider. Preventing infection You and any caregivers should wash your hands often with soap and water for at least 20 seconds. Wash hands: Before touching the PICC or the infusion device. Before changing a  bandage (dressing). Do not change the dressing unless you have been taught to do so and have shown you are able to change it safely. Flush the PICC as told. Tell your health care provider right away if the PICC is hard to flush or does not flush. Do not use force to flush the PICC. Use clean and germ-free (sterile) supplies only. Keep the supplies in a dry place. Do not reuse needles, syringes, or any other supplies. Reusing supplies can lead to infection. Keep the PICC dressing dry and secure it with tape if the edges stop sticking to your skin. Check your PICC insertion site every day for signs of infection. Check for: Redness, swelling, or pain. Fluid or blood. Warmth. Pus or a bad smell. Preventing other problems Do not use a syringe that is less than 10 mL to flush the PICC. Do not have your blood pressure checked on the arm in which the PICC is placed. Do not ever pull or tug on the PICC. Keep it secured to your arm with tape or a stretch wrap when not in use. Do not take the PICC out yourself. Only a trained health care provider should remove the PICC. Keep pets and children away from your PICC. How to care for your PICC dressing Keep your PICC dressing clean and dry to prevent infection. Do not take baths, swim, or use a hot tub until your health care provider approves. Ask your health care provider if you can take  showers. You may only be allowed to take sponge baths. When you are allowed to shower: Ask your health care provider to teach you how to wrap the PICC. Cover the PICC with clear plastic wrap and tape to keep it dry while showering. Follow instructions from your health care provider about how to take care of your insertion site and dressing. Make sure you: Wash your hands with soap and water for at least 20 seconds before and after you change your dressing. If soap and water are not available, use hand sanitizer. Change your dressing only if taught to do so by your health care  provider. Your PICC dressing needs to be changed if it becomes loose or wet. Leave stitches (sutures), skin glue, or adhesive strips in place. These skin closures may need to stay in place for 2 weeks or longer. If adhesive strip edges start to loosen and curl up, you may trim the loose edges. Do not remove adhesive strips completely unless your health care provider tells you to do that. Follow these instructions at home: Disposal of supplies Throw away any syringes in a disposal container that is meant for sharp items (sharps container). You can buy a sharps container from a pharmacy, or you can make one by using an empty, hard plastic bottle with a lid. Place any used dressings or infusion bags into a plastic bag. Throw that bag in the trash. General instructions  Always carry your PICC identification card or wear a medical alert bracelet. Keep the tube clamped at all times, unless it is being used. Always carry a smooth-edge clamp with you to clamp the PICC if it breaks. Do not use scissors or sharp objects near the tube. You may bend your arm and move it freely. If your PICC is near or at the bend of your elbow, avoid activity with repeated motion at the elbow. Avoid lifting heavy objects as told by your health care provider. Keep all follow-up visits. This is important. You will need to have your PICC dressing changed at least once a week. Contact a health care provider if: You have pain in your arm, ear, face, or teeth. You have a fever or chills. You have redness, swelling, or pain around the insertion site. You have fluid or blood coming from the insertion site. Your insertion site feels warm to the touch. You have pus or a bad smell coming from the insertion site. Your skin feels hard and raised around the insertion site. Your PICC dressing has gotten wet or is coming off and you have not been taught how to change it. Get help right away if: You have problems with your PICC, such as  your PICC: Was tugged or pulled and has partially come out. Do not  push the PICC back in. Cannot be flushed, is hard to flush, or leaks around the insertion site when it is flushed. Makes a flushing sound when it is flushed. Appears to have a hole or tear. Is accidentally pulled all the way out. If this happens, cover the insertion site with a gauze dressing. Do not throw the PICC away. Your health care provider will need to check it to be sure the entire catheter came out. You feel your heart racing or skipping beats, or you have chest pain. You have shortness of breath or trouble breathing. You have swelling, redness, warmth, or pain in the arm in which the PICC is placed. You have a red streak going up your arm that  starts under the PICC dressing. These symptoms may be an emergency. Get help right away. Call 911. Do not wait to see if the symptoms will go away. Do not drive yourself to the hospital. Summary A peripherally inserted central catheter (PICC) is a long, thin, flexible tube (catheter) that is put into a vein in the arm or leg. If cared for properly, a PICC can remain in place for many months. Having a PICC can allow you to go home from the hospital sooner and continue treatment at home. The PICC is inserted using a germ-free (sterile) technique by a specially trained health care provider. Only a trained health care provider should remove it. Do not have your blood pressure checked on the arm in which your PICC is placed. Always keep your PICC identification card with you. This information is not intended to replace advice given to you by your health care provider. Make sure you discuss any questions you have with your health care provider. Document Revised: 10/27/2020 Document Reviewed: 10/27/2020 Elsevier Patient Education  2024 [employer].

## 2023-04-11 ENCOUNTER — Inpatient Hospital Stay: Payer: No Typology Code available for payment source

## 2023-04-11 VITALS — BP 134/92 | HR 115 | Temp 97.8°F | Resp 19

## 2023-04-11 DIAGNOSIS — Z5111 Encounter for antineoplastic chemotherapy: Secondary | ICD-10-CM | POA: Diagnosis not present

## 2023-04-11 DIAGNOSIS — C180 Malignant neoplasm of cecum: Secondary | ICD-10-CM

## 2023-04-11 MED ORDER — HEPARIN SOD (PORK) LOCK FLUSH 100 UNIT/ML IV SOLN
500.0000 [IU] | Freq: Once | INTRAVENOUS | Status: AC
  Administered 2023-04-11: 500 [IU] via INTRAVENOUS

## 2023-04-11 MED ORDER — SODIUM CHLORIDE 0.9% FLUSH
10.0000 mL | INTRAVENOUS | Status: DC | PRN
  Administered 2023-04-11: 10 mL via INTRAVENOUS

## 2023-04-11 NOTE — Patient Instructions (Signed)
PICC Home Care Guide A peripherally inserted central catheter (PICC) is a form of IV access that allows medicines and IV fluids to be quickly put into the blood and spread throughout the body. The PICC is a long, thin, flexible tube (catheter) that is put into a vein in a person's arm or leg. The catheter ends in a large vein just outside the heart called the superior vena cava (SVC). After the PICC is put in, a chest X-ray may be done to make sure that it is in the right place. A PICC may be placed for different reasons, such as: To give medicines and liquid nutrition. To give IV fluids and blood products. To take blood samples often. If there is trouble placing a peripheral intravenous (PIV) catheter. If cared for properly, a PICC can remain in place for many months. Having a PICC can allow you to go home from the hospital sooner and continue treatment at home. Medicines and PICC care can be managed at home by a family member, caregiver, or home health care team. What are the risks? Generally, having a PICC is safe. However, problems may occur, including: A blood clot (thrombus) forming in or at the end of the PICC. A blood clot forming in a vein (deep vein thrombosis) or traveling to the lung (pulmonary embolism). Inflammation of the vein (phlebitis) in which the PICC is placed. Infection at the insertion site or in the blood. Blood infections from central lines, like PICCs, can be serious and often require a hospital stay. PICC malposition, or PICC movement or poor placement. A break or cut in the PICC. Do not use scissors near the PICC. Nerve or tendon irritation or injury during PICC insertion. How to care for your PICC Please follow the specific guidelines provided by your health care provider. Preventing infection You and any caregivers should wash your hands often with soap and water for at least 20 seconds. Wash hands: Before touching the PICC or the infusion device. Before changing a  bandage (dressing). Do not change the dressing unless you have been taught to do so and have shown you are able to change it safely. Flush the PICC as told. Tell your health care provider right away if the PICC is hard to flush or does not flush. Do not use force to flush the PICC. Use clean and germ-free (sterile) supplies only. Keep the supplies in a dry place. Do not reuse needles, syringes, or any other supplies. Reusing supplies can lead to infection. Keep the PICC dressing dry and secure it with tape if the edges stop sticking to your skin. Check your PICC insertion site every day for signs of infection. Check for: Redness, swelling, or pain. Fluid or blood. Warmth. Pus or a bad smell. Preventing other problems Do not use a syringe that is less than 10 mL to flush the PICC. Do not have your blood pressure checked on the arm in which the PICC is placed. Do not ever pull or tug on the PICC. Keep it secured to your arm with tape or a stretch wrap when not in use. Do not take the PICC out yourself. Only a trained health care provider should remove the PICC. Keep pets and children away from your PICC. How to care for your PICC dressing Keep your PICC dressing clean and dry to prevent infection. Do not take baths, swim, or use a hot tub until your health care provider approves. Ask your health care provider if you can take  showers. You may only be allowed to take sponge baths. When you are allowed to shower: Ask your health care provider to teach you how to wrap the PICC. Cover the PICC with clear plastic wrap and tape to keep it dry while showering. Follow instructions from your health care provider about how to take care of your insertion site and dressing. Make sure you: Wash your hands with soap and water for at least 20 seconds before and after you change your dressing. If soap and water are not available, use hand sanitizer. Change your dressing only if taught to do so by your health care  provider. Your PICC dressing needs to be changed if it becomes loose or wet. Leave stitches (sutures), skin glue, or adhesive strips in place. These skin closures may need to stay in place for 2 weeks or longer. If adhesive strip edges start to loosen and curl up, you may trim the loose edges. Do not remove adhesive strips completely unless your health care provider tells you to do that. Follow these instructions at home: Disposal of supplies Throw away any syringes in a disposal container that is meant for sharp items (sharps container). You can buy a sharps container from a pharmacy, or you can make one by using an empty, hard plastic bottle with a lid. Place any used dressings or infusion bags into a plastic bag. Throw that bag in the trash. General instructions  Always carry your PICC identification card or wear a medical alert bracelet. Keep the tube clamped at all times, unless it is being used. Always carry a smooth-edge clamp with you to clamp the PICC if it breaks. Do not use scissors or sharp objects near the tube. You may bend your arm and move it freely. If your PICC is near or at the bend of your elbow, avoid activity with repeated motion at the elbow. Avoid lifting heavy objects as told by your health care provider. Keep all follow-up visits. This is important. You will need to have your PICC dressing changed at least once a week. Contact a health care provider if: You have pain in your arm, ear, face, or teeth. You have a fever or chills. You have redness, swelling, or pain around the insertion site. You have fluid or blood coming from the insertion site. Your insertion site feels warm to the touch. You have pus or a bad smell coming from the insertion site. Your skin feels hard and raised around the insertion site. Your PICC dressing has gotten wet or is coming off and you have not been taught how to change it. Get help right away if: You have problems with your PICC, such as  your PICC: Was tugged or pulled and has partially come out. Do not  push the PICC back in. Cannot be flushed, is hard to flush, or leaks around the insertion site when it is flushed. Makes a flushing sound when it is flushed. Appears to have a hole or tear. Is accidentally pulled all the way out. If this happens, cover the insertion site with a gauze dressing. Do not throw the PICC away. Your health care provider will need to check it to be sure the entire catheter came out. You feel your heart racing or skipping beats, or you have chest pain. You have shortness of breath or trouble breathing. You have swelling, redness, warmth, or pain in the arm in which the PICC is placed. You have a red streak going up your arm that  starts under the PICC dressing. These symptoms may be an emergency. Get help right away. Call 911. Do not wait to see if the symptoms will go away. Do not drive yourself to the hospital. Summary A peripherally inserted central catheter (PICC) is a long, thin, flexible tube (catheter) that is put into a vein in the arm or leg. If cared for properly, a PICC can remain in place for many months. Having a PICC can allow you to go home from the hospital sooner and continue treatment at home. The PICC is inserted using a germ-free (sterile) technique by a specially trained health care provider. Only a trained health care provider should remove it. Do not have your blood pressure checked on the arm in which your PICC is placed. Always keep your PICC identification card with you. This information is not intended to replace advice given to you by your health care provider. Make sure you discuss any questions you have with your health care provider. Document Revised: 10/27/2020 Document Reviewed: 10/27/2020 Elsevier Patient Education  2024 [employer].

## 2023-04-13 ENCOUNTER — Inpatient Hospital Stay: Payer: No Typology Code available for payment source

## 2023-04-13 VITALS — BP 107/87 | HR 88 | Temp 97.7°F | Resp 20

## 2023-04-13 DIAGNOSIS — Z5111 Encounter for antineoplastic chemotherapy: Secondary | ICD-10-CM | POA: Diagnosis not present

## 2023-04-13 DIAGNOSIS — C180 Malignant neoplasm of cecum: Secondary | ICD-10-CM

## 2023-04-13 MED ORDER — SODIUM CHLORIDE 0.9% FLUSH
10.0000 mL | Freq: Once | INTRAVENOUS | Status: AC
  Administered 2023-04-13: 10 mL

## 2023-04-13 MED ORDER — HEPARIN SOD (PORK) LOCK FLUSH 100 UNIT/ML IV SOLN
250.0000 [IU] | Freq: Once | INTRAVENOUS | Status: AC
  Administered 2023-04-13: 250 [IU]

## 2023-04-13 NOTE — Patient Instructions (Signed)
PICC Home Care Guide A peripherally inserted central catheter (PICC) is a form of IV access that allows medicines and IV fluids to be quickly put into the blood and spread throughout the body. The PICC is a long, thin, flexible tube (catheter) that is put into a vein in a person's arm or leg. The catheter ends in a large vein just outside the heart called the superior vena cava (SVC). After the PICC is put in, a chest X-ray may be done to make sure that it is in the right place. A PICC may be placed for different reasons, such as: To give medicines and liquid nutrition. To give IV fluids and blood products. To take blood samples often. If there is trouble placing a peripheral intravenous (PIV) catheter. If cared for properly, a PICC can remain in place for many months. Having a PICC can allow you to go home from the hospital sooner and continue treatment at home. Medicines and PICC care can be managed at home by a family member, caregiver, or home health care team. What are the risks? Generally, having a PICC is safe. However, problems may occur, including: A blood clot (thrombus) forming in or at the end of the PICC. A blood clot forming in a vein (deep vein thrombosis) or traveling to the lung (pulmonary embolism). Inflammation of the vein (phlebitis) in which the PICC is placed. Infection at the insertion site or in the blood. Blood infections from central lines, like PICCs, can be serious and often require a hospital stay. PICC malposition, or PICC movement or poor placement. A break or cut in the PICC. Do not use scissors near the PICC. Nerve or tendon irritation or injury during PICC insertion. How to care for your PICC Please follow the specific guidelines provided by your health care provider. Preventing infection You and any caregivers should wash your hands often with soap and water for at least 20 seconds. Wash hands: Before touching the PICC or the infusion device. Before changing a  bandage (dressing). Do not change the dressing unless you have been taught to do so and have shown you are able to change it safely. Flush the PICC as told. Tell your health care provider right away if the PICC is hard to flush or does not flush. Do not use force to flush the PICC. Use clean and germ-free (sterile) supplies only. Keep the supplies in a dry place. Do not reuse needles, syringes, or any other supplies. Reusing supplies can lead to infection. Keep the PICC dressing dry and secure it with tape if the edges stop sticking to your skin. Check your PICC insertion site every day for signs of infection. Check for: Redness, swelling, or pain. Fluid or blood. Warmth. Pus or a bad smell. Preventing other problems Do not use a syringe that is less than 10 mL to flush the PICC. Do not have your blood pressure checked on the arm in which the PICC is placed. Do not ever pull or tug on the PICC. Keep it secured to your arm with tape or a stretch wrap when not in use. Do not take the PICC out yourself. Only a trained health care provider should remove the PICC. Keep pets and children away from your PICC. How to care for your PICC dressing Keep your PICC dressing clean and dry to prevent infection. Do not take baths, swim, or use a hot tub until your health care provider approves. Ask your health care provider if you can take  showers. You may only be allowed to take sponge baths. When you are allowed to shower: Ask your health care provider to teach you how to wrap the PICC. Cover the PICC with clear plastic wrap and tape to keep it dry while showering. Follow instructions from your health care provider about how to take care of your insertion site and dressing. Make sure you: Wash your hands with soap and water for at least 20 seconds before and after you change your dressing. If soap and water are not available, use hand sanitizer. Change your dressing only if taught to do so by your health care  provider. Your PICC dressing needs to be changed if it becomes loose or wet. Leave stitches (sutures), skin glue, or adhesive strips in place. These skin closures may need to stay in place for 2 weeks or longer. If adhesive strip edges start to loosen and curl up, you may trim the loose edges. Do not remove adhesive strips completely unless your health care provider tells you to do that. Follow these instructions at home: Disposal of supplies Throw away any syringes in a disposal container that is meant for sharp items (sharps container). You can buy a sharps container from a pharmacy, or you can make one by using an empty, hard plastic bottle with a lid. Place any used dressings or infusion bags into a plastic bag. Throw that bag in the trash. General instructions  Always carry your PICC identification card or wear a medical alert bracelet. Keep the tube clamped at all times, unless it is being used. Always carry a smooth-edge clamp with you to clamp the PICC if it breaks. Do not use scissors or sharp objects near the tube. You may bend your arm and move it freely. If your PICC is near or at the bend of your elbow, avoid activity with repeated motion at the elbow. Avoid lifting heavy objects as told by your health care provider. Keep all follow-up visits. This is important. You will need to have your PICC dressing changed at least once a week. Contact a health care provider if: You have pain in your arm, ear, face, or teeth. You have a fever or chills. You have redness, swelling, or pain around the insertion site. You have fluid or blood coming from the insertion site. Your insertion site feels warm to the touch. You have pus or a bad smell coming from the insertion site. Your skin feels hard and raised around the insertion site. Your PICC dressing has gotten wet or is coming off and you have not been taught how to change it. Get help right away if: You have problems with your PICC, such as  your PICC: Was tugged or pulled and has partially come out. Do not  push the PICC back in. Cannot be flushed, is hard to flush, or leaks around the insertion site when it is flushed. Makes a flushing sound when it is flushed. Appears to have a hole or tear. Is accidentally pulled all the way out. If this happens, cover the insertion site with a gauze dressing. Do not throw the PICC away. Your health care provider will need to check it to be sure the entire catheter came out. You feel your heart racing or skipping beats, or you have chest pain. You have shortness of breath or trouble breathing. You have swelling, redness, warmth, or pain in the arm in which the PICC is placed. You have a red streak going up your arm that  starts under the PICC dressing. These symptoms may be an emergency. Get help right away. Call 911. Do not wait to see if the symptoms will go away. Do not drive yourself to the hospital. Summary A peripherally inserted central catheter (PICC) is a long, thin, flexible tube (catheter) that is put into a vein in the arm or leg. If cared for properly, a PICC can remain in place for many months. Having a PICC can allow you to go home from the hospital sooner and continue treatment at home. The PICC is inserted using a germ-free (sterile) technique by a specially trained health care provider. Only a trained health care provider should remove it. Do not have your blood pressure checked on the arm in which your PICC is placed. Always keep your PICC identification card with you. This information is not intended to replace advice given to you by your health care provider. Make sure you discuss any questions you have with your health care provider. Document Revised: 10/27/2020 Document Reviewed: 10/27/2020 Elsevier Patient Education  2024 [employer].

## 2023-04-17 ENCOUNTER — Inpatient Hospital Stay: Payer: No Typology Code available for payment source

## 2023-04-17 DIAGNOSIS — Z5111 Encounter for antineoplastic chemotherapy: Secondary | ICD-10-CM | POA: Diagnosis not present

## 2023-04-17 NOTE — Patient Instructions (Signed)
PICC Home Care Guide A peripherally inserted central catheter (PICC) is a form of IV access that allows medicines and IV fluids to be quickly put into the blood and spread throughout the body. The PICC is a long, thin, flexible tube (catheter) that is put into a vein in a person's arm or leg. The catheter ends in a large vein just outside the heart called the superior vena cava (SVC). After the PICC is put in, a chest X-ray may be done to make sure that it is in the right place. A PICC may be placed for different reasons, such as: To give medicines and liquid nutrition. To give IV fluids and blood products. To take blood samples often. If there is trouble placing a peripheral intravenous (PIV) catheter. If cared for properly, a PICC can remain in place for many months. Having a PICC can allow you to go home from the hospital sooner and continue treatment at home. Medicines and PICC care can be managed at home by a family member, caregiver, or home health care team. What are the risks? Generally, having a PICC is safe. However, problems may occur, including: A blood clot (thrombus) forming in or at the end of the PICC. A blood clot forming in a vein (deep vein thrombosis) or traveling to the lung (pulmonary embolism). Inflammation of the vein (phlebitis) in which the PICC is placed. Infection at the insertion site or in the blood. Blood infections from central lines, like PICCs, can be serious and often require a hospital stay. PICC malposition, or PICC movement or poor placement. A break or cut in the PICC. Do not use scissors near the PICC. Nerve or tendon irritation or injury during PICC insertion. How to care for your PICC Please follow the specific guidelines provided by your health care provider. Preventing infection You and any caregivers should wash your hands often with soap and water for at least 20 seconds. Wash hands: Before touching the PICC or the infusion device. Before changing a  bandage (dressing). Do not change the dressing unless you have been taught to do so and have shown you are able to change it safely. Flush the PICC as told. Tell your health care provider right away if the PICC is hard to flush or does not flush. Do not use force to flush the PICC. Use clean and germ-free (sterile) supplies only. Keep the supplies in a dry place. Do not reuse needles, syringes, or any other supplies. Reusing supplies can lead to infection. Keep the PICC dressing dry and secure it with tape if the edges stop sticking to your skin. Check your PICC insertion site every day for signs of infection. Check for: Redness, swelling, or pain. Fluid or blood. Warmth. Pus or a bad smell. Preventing other problems Do not use a syringe that is less than 10 mL to flush the PICC. Do not have your blood pressure checked on the arm in which the PICC is placed. Do not ever pull or tug on the PICC. Keep it secured to your arm with tape or a stretch wrap when not in use. Do not take the PICC out yourself. Only a trained health care provider should remove the PICC. Keep pets and children away from your PICC. How to care for your PICC dressing Keep your PICC dressing clean and dry to prevent infection. Do not take baths, swim, or use a hot tub until your health care provider approves. Ask your health care provider if you can take  showers. You may only be allowed to take sponge baths. When you are allowed to shower: Ask your health care provider to teach you how to wrap the PICC. Cover the PICC with clear plastic wrap and tape to keep it dry while showering. Follow instructions from your health care provider about how to take care of your insertion site and dressing. Make sure you: Wash your hands with soap and water for at least 20 seconds before and after you change your dressing. If soap and water are not available, use hand sanitizer. Change your dressing only if taught to do so by your health care  provider. Your PICC dressing needs to be changed if it becomes loose or wet. Leave stitches (sutures), skin glue, or adhesive strips in place. These skin closures may need to stay in place for 2 weeks or longer. If adhesive strip edges start to loosen and curl up, you may trim the loose edges. Do not remove adhesive strips completely unless your health care provider tells you to do that. Follow these instructions at home: Disposal of supplies Throw away any syringes in a disposal container that is meant for sharp items (sharps container). You can buy a sharps container from a pharmacy, or you can make one by using an empty, hard plastic bottle with a lid. Place any used dressings or infusion bags into a plastic bag. Throw that bag in the trash. General instructions  Always carry your PICC identification card or wear a medical alert bracelet. Keep the tube clamped at all times, unless it is being used. Always carry a smooth-edge clamp with you to clamp the PICC if it breaks. Do not use scissors or sharp objects near the tube. You may bend your arm and move it freely. If your PICC is near or at the bend of your elbow, avoid activity with repeated motion at the elbow. Avoid lifting heavy objects as told by your health care provider. Keep all follow-up visits. This is important. You will need to have your PICC dressing changed at least once a week. Contact a health care provider if: You have pain in your arm, ear, face, or teeth. You have a fever or chills. You have redness, swelling, or pain around the insertion site. You have fluid or blood coming from the insertion site. Your insertion site feels warm to the touch. You have pus or a bad smell coming from the insertion site. Your skin feels hard and raised around the insertion site. Your PICC dressing has gotten wet or is coming off and you have not been taught how to change it. Get help right away if: You have problems with your PICC, such as  your PICC: Was tugged or pulled and has partially come out. Do not  push the PICC back in. Cannot be flushed, is hard to flush, or leaks around the insertion site when it is flushed. Makes a flushing sound when it is flushed. Appears to have a hole or tear. Is accidentally pulled all the way out. If this happens, cover the insertion site with a gauze dressing. Do not throw the PICC away. Your health care provider will need to check it to be sure the entire catheter came out. You feel your heart racing or skipping beats, or you have chest pain. You have shortness of breath or trouble breathing. You have swelling, redness, warmth, or pain in the arm in which the PICC is placed. You have a red streak going up your arm that  starts under the PICC dressing. These symptoms may be an emergency. Get help right away. Call 911. Do not wait to see if the symptoms will go away. Do not drive yourself to the hospital. Summary A peripherally inserted central catheter (PICC) is a long, thin, flexible tube (catheter) that is put into a vein in the arm or leg. If cared for properly, a PICC can remain in place for many months. Having a PICC can allow you to go home from the hospital sooner and continue treatment at home. The PICC is inserted using a germ-free (sterile) technique by a specially trained health care provider. Only a trained health care provider should remove it. Do not have your blood pressure checked on the arm in which your PICC is placed. Always keep your PICC identification card with you. This information is not intended to replace advice given to you by your health care provider. Make sure you discuss any questions you have with your health care provider. Document Revised: 10/27/2020 Document Reviewed: 10/27/2020 Elsevier Patient Education  2024 [employer].

## 2023-04-19 ENCOUNTER — Inpatient Hospital Stay: Payer: No Typology Code available for payment source

## 2023-04-19 VITALS — BP 136/90 | HR 81 | Temp 97.5°F | Resp 18

## 2023-04-19 DIAGNOSIS — C180 Malignant neoplasm of cecum: Secondary | ICD-10-CM

## 2023-04-19 DIAGNOSIS — Z5111 Encounter for antineoplastic chemotherapy: Secondary | ICD-10-CM | POA: Diagnosis not present

## 2023-04-19 MED ORDER — SODIUM CHLORIDE 0.9% FLUSH
10.0000 mL | Freq: Once | INTRAVENOUS | Status: AC
  Administered 2023-04-19: 10 mL

## 2023-04-19 MED ORDER — HEPARIN SOD (PORK) LOCK FLUSH 100 UNIT/ML IV SOLN
250.0000 [IU] | Freq: Once | INTRAVENOUS | Status: AC
  Administered 2023-04-19: 250 [IU]

## 2023-04-19 NOTE — Patient Instructions (Signed)
PICC Home Care Guide A peripherally inserted central catheter (PICC) is a form of IV access that allows medicines and IV fluids to be quickly put into the blood and spread throughout the body. The PICC is a long, thin, flexible tube (catheter) that is put into a vein in a person's arm or leg. The catheter ends in a large vein just outside the heart called the superior vena cava (SVC). After the PICC is put in, a chest X-ray may be done to make sure that it is in the right place. A PICC may be placed for different reasons, such as: To give medicines and liquid nutrition. To give IV fluids and blood products. To take blood samples often. If there is trouble placing a peripheral intravenous (PIV) catheter. If cared for properly, a PICC can remain in place for many months. Having a PICC can allow you to go home from the hospital sooner and continue treatment at home. Medicines and PICC care can be managed at home by a family member, caregiver, or home health care team. What are the risks? Generally, having a PICC is safe. However, problems may occur, including: A blood clot (thrombus) forming in or at the end of the PICC. A blood clot forming in a vein (deep vein thrombosis) or traveling to the lung (pulmonary embolism). Inflammation of the vein (phlebitis) in which the PICC is placed. Infection at the insertion site or in the blood. Blood infections from central lines, like PICCs, can be serious and often require a hospital stay. PICC malposition, or PICC movement or poor placement. A break or cut in the PICC. Do not use scissors near the PICC. Nerve or tendon irritation or injury during PICC insertion. How to care for your PICC Please follow the specific guidelines provided by your health care provider. Preventing infection You and any caregivers should wash your hands often with soap and water for at least 20 seconds. Wash hands: Before touching the PICC or the infusion device. Before changing a  bandage (dressing). Do not change the dressing unless you have been taught to do so and have shown you are able to change it safely. Flush the PICC as told. Tell your health care provider right away if the PICC is hard to flush or does not flush. Do not use force to flush the PICC. Use clean and germ-free (sterile) supplies only. Keep the supplies in a dry place. Do not reuse needles, syringes, or any other supplies. Reusing supplies can lead to infection. Keep the PICC dressing dry and secure it with tape if the edges stop sticking to your skin. Check your PICC insertion site every day for signs of infection. Check for: Redness, swelling, or pain. Fluid or blood. Warmth. Pus or a bad smell. Preventing other problems Do not use a syringe that is less than 10 mL to flush the PICC. Do not have your blood pressure checked on the arm in which the PICC is placed. Do not ever pull or tug on the PICC. Keep it secured to your arm with tape or a stretch wrap when not in use. Do not take the PICC out yourself. Only a trained health care provider should remove the PICC. Keep pets and children away from your PICC. How to care for your PICC dressing Keep your PICC dressing clean and dry to prevent infection. Do not take baths, swim, or use a hot tub until your health care provider approves. Ask your health care provider if you can take  showers. You may only be allowed to take sponge baths. When you are allowed to shower: Ask your health care provider to teach you how to wrap the PICC. Cover the PICC with clear plastic wrap and tape to keep it dry while showering. Follow instructions from your health care provider about how to take care of your insertion site and dressing. Make sure you: Wash your hands with soap and water for at least 20 seconds before and after you change your dressing. If soap and water are not available, use hand sanitizer. Change your dressing only if taught to do so by your health care  provider. Your PICC dressing needs to be changed if it becomes loose or wet. Leave stitches (sutures), skin glue, or adhesive strips in place. These skin closures may need to stay in place for 2 weeks or longer. If adhesive strip edges start to loosen and curl up, you may trim the loose edges. Do not remove adhesive strips completely unless your health care provider tells you to do that. Follow these instructions at home: Disposal of supplies Throw away any syringes in a disposal container that is meant for sharp items (sharps container). You can buy a sharps container from a pharmacy, or you can make one by using an empty, hard plastic bottle with a lid. Place any used dressings or infusion bags into a plastic bag. Throw that bag in the trash. General instructions  Always carry your PICC identification card or wear a medical alert bracelet. Keep the tube clamped at all times, unless it is being used. Always carry a smooth-edge clamp with you to clamp the PICC if it breaks. Do not use scissors or sharp objects near the tube. You may bend your arm and move it freely. If your PICC is near or at the bend of your elbow, avoid activity with repeated motion at the elbow. Avoid lifting heavy objects as told by your health care provider. Keep all follow-up visits. This is important. You will need to have your PICC dressing changed at least once a week. Contact a health care provider if: You have pain in your arm, ear, face, or teeth. You have a fever or chills. You have redness, swelling, or pain around the insertion site. You have fluid or blood coming from the insertion site. Your insertion site feels warm to the touch. You have pus or a bad smell coming from the insertion site. Your skin feels hard and raised around the insertion site. Your PICC dressing has gotten wet or is coming off and you have not been taught how to change it. Get help right away if: You have problems with your PICC, such as  your PICC: Was tugged or pulled and has partially come out. Do not  push the PICC back in. Cannot be flushed, is hard to flush, or leaks around the insertion site when it is flushed. Makes a flushing sound when it is flushed. Appears to have a hole or tear. Is accidentally pulled all the way out. If this happens, cover the insertion site with a gauze dressing. Do not throw the PICC away. Your health care provider will need to check it to be sure the entire catheter came out. You feel your heart racing or skipping beats, or you have chest pain. You have shortness of breath or trouble breathing. You have swelling, redness, warmth, or pain in the arm in which the PICC is placed. You have a red streak going up your arm that  starts under the PICC dressing. These symptoms may be an emergency. Get help right away. Call 911. Do not wait to see if the symptoms will go away. Do not drive yourself to the hospital. Summary A peripherally inserted central catheter (PICC) is a long, thin, flexible tube (catheter) that is put into a vein in the arm or leg. If cared for properly, a PICC can remain in place for many months. Having a PICC can allow you to go home from the hospital sooner and continue treatment at home. The PICC is inserted using a germ-free (sterile) technique by a specially trained health care provider. Only a trained health care provider should remove it. Do not have your blood pressure checked on the arm in which your PICC is placed. Always keep your PICC identification card with you. This information is not intended to replace advice given to you by your health care provider. Make sure you discuss any questions you have with your health care provider. Document Revised: 10/27/2020 Document Reviewed: 10/27/2020 Elsevier Patient Education  2024 [employer].

## 2023-04-23 ENCOUNTER — Other Ambulatory Visit: Payer: Self-pay | Admitting: *Deleted

## 2023-04-23 DIAGNOSIS — C180 Malignant neoplasm of cecum: Secondary | ICD-10-CM

## 2023-04-23 DIAGNOSIS — C787 Secondary malignant neoplasm of liver and intrahepatic bile duct: Secondary | ICD-10-CM

## 2023-04-24 ENCOUNTER — Inpatient Hospital Stay: Payer: No Typology Code available for payment source

## 2023-04-24 ENCOUNTER — Other Ambulatory Visit: Payer: Self-pay

## 2023-04-24 ENCOUNTER — Inpatient Hospital Stay (HOSPITAL_BASED_OUTPATIENT_CLINIC_OR_DEPARTMENT_OTHER): Payer: No Typology Code available for payment source | Admitting: Hematology & Oncology

## 2023-04-24 ENCOUNTER — Encounter: Payer: Self-pay | Admitting: Hematology & Oncology

## 2023-04-24 ENCOUNTER — Ambulatory Visit: Payer: No Typology Code available for payment source

## 2023-04-24 VITALS — BP 104/58 | HR 75 | Temp 97.8°F | Resp 18 | Ht 64.0 in | Wt 267.0 lb

## 2023-04-24 DIAGNOSIS — C180 Malignant neoplasm of cecum: Secondary | ICD-10-CM

## 2023-04-24 DIAGNOSIS — C787 Secondary malignant neoplasm of liver and intrahepatic bile duct: Secondary | ICD-10-CM

## 2023-04-24 DIAGNOSIS — R232 Flushing: Secondary | ICD-10-CM

## 2023-04-24 DIAGNOSIS — Z5111 Encounter for antineoplastic chemotherapy: Secondary | ICD-10-CM | POA: Diagnosis not present

## 2023-04-24 LAB — CBC WITH DIFFERENTIAL (CANCER CENTER ONLY)
Abs Immature Granulocytes: 0.01 10*3/uL (ref 0.00–0.07)
Basophils Absolute: 0.1 10*3/uL (ref 0.0–0.1)
Basophils Relative: 1 %
Eosinophils Absolute: 0.1 10*3/uL (ref 0.0–0.5)
Eosinophils Relative: 2 %
HCT: 35.9 % — ABNORMAL LOW (ref 36.0–46.0)
Hemoglobin: 11.6 g/dL — ABNORMAL LOW (ref 12.0–15.0)
Immature Granulocytes: 0 %
Lymphocytes Relative: 32 %
Lymphs Abs: 1.7 10*3/uL (ref 0.7–4.0)
MCH: 30.1 pg (ref 26.0–34.0)
MCHC: 32.3 g/dL (ref 30.0–36.0)
MCV: 93 fL (ref 80.0–100.0)
Monocytes Absolute: 0.7 10*3/uL (ref 0.1–1.0)
Monocytes Relative: 12 %
Neutro Abs: 2.8 10*3/uL (ref 1.7–7.7)
Neutrophils Relative %: 53 %
Platelet Count: 232 10*3/uL (ref 150–400)
RBC: 3.86 MIL/uL — ABNORMAL LOW (ref 3.87–5.11)
RDW: 17 % — ABNORMAL HIGH (ref 11.5–15.5)
WBC Count: 5.3 10*3/uL (ref 4.0–10.5)
nRBC: 0 % (ref 0.0–0.2)

## 2023-04-24 LAB — CMP (CANCER CENTER ONLY)
ALT: 6 U/L (ref 0–44)
AST: 13 U/L — ABNORMAL LOW (ref 15–41)
Albumin: 3.4 g/dL — ABNORMAL LOW (ref 3.5–5.0)
Alkaline Phosphatase: 70 U/L (ref 38–126)
Anion gap: 9 (ref 5–15)
BUN: 11 mg/dL (ref 6–20)
CO2: 29 mmol/L (ref 22–32)
Calcium: 8.7 mg/dL — ABNORMAL LOW (ref 8.9–10.3)
Chloride: 105 mmol/L (ref 98–111)
Creatinine: 0.68 mg/dL (ref 0.44–1.00)
GFR, Estimated: >60 mL/min (ref >60)
Glucose, Bld: 104 mg/dL — ABNORMAL HIGH (ref 70–99)
Potassium: 3.4 mmol/L — ABNORMAL LOW (ref 3.5–5.1)
Sodium: 143 mmol/L (ref 135–145)
Total Bilirubin: 0.5 mg/dL (ref 0.0–1.2)
Total Protein: 6.3 g/dL — ABNORMAL LOW (ref 6.5–8.1)

## 2023-04-24 MED ORDER — SODIUM CHLORIDE 0.9% FLUSH
10.0000 mL | Freq: Once | INTRAVENOUS | Status: AC
  Administered 2023-04-24: 10 mL via INTRAVENOUS

## 2023-04-24 MED ORDER — HEPARIN SOD (PORK) LOCK FLUSH 100 UNIT/ML IV SOLN
500.0000 [IU] | Freq: Once | INTRAVENOUS | Status: AC
Start: 2023-04-24 — End: 2023-04-24
  Administered 2023-04-24: 500 [IU] via INTRAVENOUS

## 2023-04-24 NOTE — Patient Instructions (Signed)
 PICC Home Care Guide A peripherally inserted central catheter (PICC) is a form of IV access that allows medicines and IV fluids to be quickly put into the blood and spread throughout the body. The PICC is a long, thin, flexible tube (catheter) that is put into a vein in a person's arm or leg. The catheter ends in a large vein just outside the heart called the superior vena cava (SVC). After the PICC is put in, a chest X-ray may be done to make sure that it is in the right place. A PICC may be placed for different reasons, such as: To give medicines and liquid nutrition. To give IV fluids and blood products. To take blood samples often. If there is trouble placing a peripheral intravenous (PIV) catheter. If cared for properly, a PICC can remain in place for many months. Having a PICC can allow you to go home from the hospital sooner and continue treatment at home. Medicines and PICC care can be managed at home by a family member, caregiver, or home health care team. What are the risks? Generally, having a PICC is safe. However, problems may occur, including: A blood clot (thrombus) forming in or at the end of the PICC. A blood clot forming in a vein (deep vein thrombosis) or traveling to the lung (pulmonary embolism). Inflammation of the vein (phlebitis) in which the PICC is placed. Infection at the insertion site or in the blood. Blood infections from central lines, like PICCs, can be serious and often require a hospital stay. PICC malposition, or PICC movement or poor placement. A break or cut in the PICC. Do not use scissors near the PICC. Nerve or tendon irritation or injury during PICC insertion. How to care for your PICC Please follow the specific guidelines provided by your health care provider. Preventing infection You and any caregivers should wash your hands often with soap and water for at least 20 seconds. Wash hands: Before touching the PICC or the infusion device. Before changing a  bandage (dressing). Do not change the dressing unless you have been taught to do so and have shown you are able to change it safely. Flush the PICC as told. Tell your health care provider right away if the PICC is hard to flush or does not flush. Do not use force to flush the PICC. Use clean and germ-free (sterile) supplies only. Keep the supplies in a dry place. Do not reuse needles, syringes, or any other supplies. Reusing supplies can lead to infection. Keep the PICC dressing dry and secure it with tape if the edges stop sticking to your skin. Check your PICC insertion site every day for signs of infection. Check for: Redness, swelling, or pain. Fluid or blood. Warmth. Pus or a bad smell. Preventing other problems Do not use a syringe that is less than 10 mL to flush the PICC. Do not have your blood pressure checked on the arm in which the PICC is placed. Do not ever pull or tug on the PICC. Keep it secured to your arm with tape or a stretch wrap when not in use. Do not take the PICC out yourself. Only a trained health care provider should remove the PICC. Keep pets and children away from your PICC. How to care for your PICC dressing Keep your PICC dressing clean and dry to prevent infection. Do not take baths, swim, or use a hot tub until your health care provider approves. Ask your health care provider if you can take  showers. You may only be allowed to take sponge baths. When you are allowed to shower: Ask your health care provider to teach you how to wrap the PICC. Cover the PICC with clear plastic wrap and tape to keep it dry while showering. Follow instructions from your health care provider about how to take care of your insertion site and dressing. Make sure you: Wash your hands with soap and water for at least 20 seconds before and after you change your dressing. If soap and water are not available, use hand sanitizer. Change your dressing only if taught to do so by your health care  provider. Your PICC dressing needs to be changed if it becomes loose or wet. Leave stitches (sutures), skin glue, or adhesive strips in place. These skin closures may need to stay in place for 2 weeks or longer. If adhesive strip edges start to loosen and curl up, you may trim the loose edges. Do not remove adhesive strips completely unless your health care provider tells you to do that. Follow these instructions at home: Disposal of supplies Throw away any syringes in a disposal container that is meant for sharp items (sharps container). You can buy a sharps container from a pharmacy, or you can make one by using an empty, hard plastic bottle with a lid. Place any used dressings or infusion bags into a plastic bag. Throw that bag in the trash. General instructions  Always carry your PICC identification card or wear a medical alert bracelet. Keep the tube clamped at all times, unless it is being used. Always carry a smooth-edge clamp with you to clamp the PICC if it breaks. Do not use scissors or sharp objects near the tube. You may bend your arm and move it freely. If your PICC is near or at the bend of your elbow, avoid activity with repeated motion at the elbow. Avoid lifting heavy objects as told by your health care provider. Keep all follow-up visits. This is important. You will need to have your PICC dressing changed at least once a week. Contact a health care provider if: You have pain in your arm, ear, face, or teeth. You have a fever or chills. You have redness, swelling, or pain around the insertion site. You have fluid or blood coming from the insertion site. Your insertion site feels warm to the touch. You have pus or a bad smell coming from the insertion site. Your skin feels hard and raised around the insertion site. Your PICC dressing has gotten wet or is coming off and you have not been taught how to change it. Get help right away if: You have problems with your PICC, such as  your PICC: Was tugged or pulled and has partially come out. Do not  push the PICC back in. Cannot be flushed, is hard to flush, or leaks around the insertion site when it is flushed. Makes a flushing sound when it is flushed. Appears to have a hole or tear. Is accidentally pulled all the way out. If this happens, cover the insertion site with a gauze dressing. Do not throw the PICC away. Your health care provider will need to check it to be sure the entire catheter came out. You feel your heart racing or skipping beats, or you have chest pain. You have shortness of breath or trouble breathing. You have swelling, redness, warmth, or pain in the arm in which the PICC is placed. You have a red streak going up your arm that  starts under the PICC dressing. These symptoms may be an emergency. Get help right away. Call 911. Do not wait to see if the symptoms will go away. Do not drive yourself to the hospital. Summary A peripherally inserted central catheter (PICC) is a long, thin, flexible tube (catheter) that is put into a vein in the arm or leg. If cared for properly, a PICC can remain in place for many months. Having a PICC can allow you to go home from the hospital sooner and continue treatment at home. The PICC is inserted using a germ-free (sterile) technique by a specially trained health care provider. Only a trained health care provider should remove it. Do not have your blood pressure checked on the arm in which your PICC is placed. Always keep your PICC identification card with you. This information is not intended to replace advice given to you by your health care provider. Make sure you discuss any questions you have with your health care provider. Document Revised: 10/27/2020 Document Reviewed: 10/27/2020 Elsevier Patient Education  2024 [employer].

## 2023-04-24 NOTE — Progress Notes (Signed)
 Hematology and Oncology Follow Up Visit  Lauren Mcpherson [id number] [birth year] 57 y.o. 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  The big news is that she is going have surgery at [facility] on 05/09/2023.  It sounds I do not try to resect out the primary and then placed a pump for intrahepatic chemotherapy.  I am very happy about this.  We will stop her chemotherapy right now.  Her last CEA was 140.  This actually was better than previously November..  She has had no abdominal pain.  She has had no nausea or vomiting.  She has had no change in bowel or bladder habits.  There has been no bleeding.  She continues on low-dose Xeloda .  Patient has a PICC line in.  I would keep the PICC line in until she at least has surgery.  She has had no rashes.  There is been no leg swelling.  She has had no cough.  She has had no fever.  Overall, I would say performance status is ECOG 0.   Wt Readings from Last 3 Encounters:  04/24/23 267 lb (121.1 kg)  03/26/23 267 lb 1.9 oz (121.2 kg)  03/05/23 266 lb 0.6 oz (120.7 kg)     Medications:  Allergies as of 04/24/2023   No Known Allergies      Medication List        Accurate as of April 24, 2023 10:08 AM. If you have any questions, ask your nurse or doctor.          dexamethasone  4 MG tablet Commonly known as: DECADRON  Take 2 tablets (8 mg total)  by mouth daily. Start the day after chemotherapy for 2 days. Take with food.   ondansetron  8 MG tablet Commonly known as: Zofran  Take 1 tablet (8 mg total) by mouth every 8 (eight) hours as needed for nausea or vomiting. Start on the third day after chemotherapy.   OVER THE COUNTER MEDICATION daily. Nervive - one capsule daily.   prochlorperazine  10 MG tablet Commonly known as: COMPAZINE  TAKE 1 TABLET(10 MG) BY MOUTH EVERY 6 HOURS AS NEEDED FOR NAUSEA OR VOMITING   rivaroxaban  10 MG Tabs tablet Commonly known as: XARELTO  Take 1 tablet (10 mg total) by mouth daily with supper.        Allergies: No Known Allergies  Past Medical History, Surgical history, Social history, and Family History were reviewed and updated.  Review of Systems:  Review of Systems  Constitutional: Negative.   HENT: Negative.    Eyes: Negative.   Respiratory: Negative.    Cardiovascular: Negative.   Gastrointestinal: Negative.   Genitourinary: Negative.   Musculoskeletal: Negative.   Skin: Negative.   Neurological: Negative.   Endo/Heme/Allergies: Negative.   Psychiatric/Behavioral: Negative.       Physical Exam:  height is 5' 4 (1.626 m) and weight is 267 lb (  121.1 kg). Her oral temperature is 97.8 F (36.6 C). Her blood pressure is 104/58 (abnormal) and her pulse is 75. Her respiration is 18 and oxygen saturation is 100%.   Wt Readings from Last 3 Encounters:  04/24/23 267 lb (121.1 kg)  03/26/23 267 lb 1.9 oz (121.2 kg)  03/05/23 266 lb 0.6 oz (120.7 kg)    Physical Exam Vitals reviewed.  HENT:     Head: Normocephalic and atraumatic.  Eyes:     Pupils: Pupils are equal, round, and reactive to light.  Cardiovascular:     Rate and Rhythm: Normal rate and regular rhythm.     Heart sounds: Normal heart sounds.  Pulmonary:     Effort: Pulmonary effort is normal.     Breath sounds: Normal breath sounds.  Abdominal:     General: Bowel sounds are normal.     Palpations: Abdomen is  soft.  Musculoskeletal:        General: No tenderness or deformity. Normal range of motion.     Cervical back: Normal range of motion.  Lymphadenopathy:     Cervical: No cervical adenopathy.  Skin:    General: Skin is warm and dry.     Findings: No erythema or rash.  Neurological:     Mental Status: She is alert and oriented to person, place, and time.  Psychiatric:        Behavior: Behavior normal.        Thought Content: Thought content normal.        Judgment: Judgment normal.     Lab Results  Component Value Date   WBC 5.3 04/24/2023   HGB 11.6 (L) 04/24/2023   HCT 35.9 (L) 04/24/2023   MCV 93.0 04/24/2023   PLT 232 04/24/2023   Lab Results  Component Value Date   FERRITIN 462 (H) 04/02/2023   IRON  71 04/02/2023   TIBC 273 04/02/2023   UIBC 202 04/02/2023   IRONPCTSAT 26 04/02/2023   Lab Results  Component Value Date   RETICCTPCT 1.5 12/14/2021   RBC 3.86 (L) 04/24/2023   No results found for: KPAFRELGTCHN, LAMBDASER, KAPLAMBRATIO No results found for: IGGSERUM, IGA, IGMSERUM No results found for: TOTALPROTELP, ALBUMINELP, A1GS, A2GS, BETS, BETA2SER, GAMS, MSPIKE, SPEI   Chemistry      Component Value Date/Time   NA 143 04/24/2023 0915   K 3.4 (L) 04/24/2023 0915   CL 105 04/24/2023 0915   CO2 29 04/24/2023 0915   BUN 11 04/24/2023 0915   CREATININE 0.68 04/24/2023 0915      Component Value Date/Time   CALCIUM  8.7 (L) 04/24/2023 0915   ALKPHOS 70 04/24/2023 0915   AST 13 (L) 04/24/2023 0915   ALT 6 04/24/2023 0915   BILITOT 0.5 04/24/2023 0915       Impression and Plan: Lauren Mcpherson is a very pleasant 57 yo African American female with metastatic 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  At this point, we will hold her chemotherapy.  We will wait for her surgery.  We will then see about the intrahepatic infusion of chemotherapy.  I am sure that [facility] will be able to manage this as they have a lot of expertise in  this.  She will continue to have her Port-A-Cath flushed.  We will plan to get her back probably in about a month or so.  At that point in time, we will see how her surgery went and the pathology  Lauren JONELLE Crease, MD 12/31/202410:08 AM

## 2023-04-26 ENCOUNTER — Inpatient Hospital Stay: Payer: No Typology Code available for payment source | Attending: Hematology & Oncology

## 2023-04-26 VITALS — BP 109/77 | HR 79 | Temp 97.8°F | Resp 20

## 2023-04-26 DIAGNOSIS — Z452 Encounter for adjustment and management of vascular access device: Secondary | ICD-10-CM | POA: Diagnosis not present

## 2023-04-26 DIAGNOSIS — C180 Malignant neoplasm of cecum: Secondary | ICD-10-CM | POA: Insufficient documentation

## 2023-04-26 MED ORDER — SODIUM CHLORIDE 0.9% FLUSH
10.0000 mL | INTRAVENOUS | Status: DC | PRN
  Administered 2023-04-26: 10 mL via INTRAVENOUS

## 2023-04-26 MED ORDER — HEPARIN SOD (PORK) LOCK FLUSH 100 UNIT/ML IV SOLN
500.0000 [IU] | Freq: Once | INTRAVENOUS | Status: AC
  Administered 2023-04-26: 500 [IU] via INTRAVENOUS

## 2023-04-26 NOTE — Patient Instructions (Signed)
 PICC Home Care Guide A peripherally inserted central catheter (PICC) is a form of IV access that allows medicines and IV fluids to be quickly put into the blood and spread throughout the body. The PICC is a long, thin, flexible tube (catheter) that is put into a vein in a person's arm or leg. The catheter ends in a large vein just outside the heart called the superior vena cava (SVC). After the PICC is put in, a chest X-ray may be done to make sure that it is in the right place. A PICC may be placed for different reasons, such as: To give medicines and liquid nutrition. To give IV fluids and blood products. To take blood samples often. If there is trouble placing a peripheral intravenous (PIV) catheter. If cared for properly, a PICC can remain in place for many months. Having a PICC can allow you to go home from the hospital sooner and continue treatment at home. Medicines and PICC care can be managed at home by a family member, caregiver, or home health care team. What are the risks? Generally, having a PICC is safe. However, problems may occur, including: A blood clot (thrombus) forming in or at the end of the PICC. A blood clot forming in a vein (deep vein thrombosis) or traveling to the lung (pulmonary embolism). Inflammation of the vein (phlebitis) in which the PICC is placed. Infection at the insertion site or in the blood. Blood infections from central lines, like PICCs, can be serious and often require a hospital stay. PICC malposition, or PICC movement or poor placement. A break or cut in the PICC. Do not use scissors near the PICC. Nerve or tendon irritation or injury during PICC insertion. How to care for your PICC Please follow the specific guidelines provided by your health care provider. Preventing infection You and any caregivers should wash your hands often with soap and water for at least 20 seconds. Wash hands: Before touching the PICC or the infusion device. Before changing a  bandage (dressing). Do not change the dressing unless you have been taught to do so and have shown you are able to change it safely. Flush the PICC as told. Tell your health care provider right away if the PICC is hard to flush or does not flush. Do not use force to flush the PICC. Use clean and germ-free (sterile) supplies only. Keep the supplies in a dry place. Do not reuse needles, syringes, or any other supplies. Reusing supplies can lead to infection. Keep the PICC dressing dry and secure it with tape if the edges stop sticking to your skin. Check your PICC insertion site every day for signs of infection. Check for: Redness, swelling, or pain. Fluid or blood. Warmth. Pus or a bad smell. Preventing other problems Do not use a syringe that is less than 10 mL to flush the PICC. Do not have your blood pressure checked on the arm in which the PICC is placed. Do not ever pull or tug on the PICC. Keep it secured to your arm with tape or a stretch wrap when not in use. Do not take the PICC out yourself. Only a trained health care provider should remove the PICC. Keep pets and children away from your PICC. How to care for your PICC dressing Keep your PICC dressing clean and dry to prevent infection. Do not take baths, swim, or use a hot tub until your health care provider approves. Ask your health care provider if you can take  showers. You may only be allowed to take sponge baths. When you are allowed to shower: Ask your health care provider to teach you how to wrap the PICC. Cover the PICC with clear plastic wrap and tape to keep it dry while showering. Follow instructions from your health care provider about how to take care of your insertion site and dressing. Make sure you: Wash your hands with soap and water for at least 20 seconds before and after you change your dressing. If soap and water are not available, use hand sanitizer. Change your dressing only if taught to do so by your health care  provider. Your PICC dressing needs to be changed if it becomes loose or wet. Leave stitches (sutures), skin glue, or adhesive strips in place. These skin closures may need to stay in place for 2 weeks or longer. If adhesive strip edges start to loosen and curl up, you may trim the loose edges. Do not remove adhesive strips completely unless your health care provider tells you to do that. Follow these instructions at home: Disposal of supplies Throw away any syringes in a disposal container that is meant for sharp items (sharps container). You can buy a sharps container from a pharmacy, or you can make one by using an empty, hard plastic bottle with a lid. Place any used dressings or infusion bags into a plastic bag. Throw that bag in the trash. General instructions  Always carry your PICC identification card or wear a medical alert bracelet. Keep the tube clamped at all times, unless it is being used. Always carry a smooth-edge clamp with you to clamp the PICC if it breaks. Do not use scissors or sharp objects near the tube. You may bend your arm and move it freely. If your PICC is near or at the bend of your elbow, avoid activity with repeated motion at the elbow. Avoid lifting heavy objects as told by your health care provider. Keep all follow-up visits. This is important. You will need to have your PICC dressing changed at least once a week. Contact a health care provider if: You have pain in your arm, ear, face, or teeth. You have a fever or chills. You have redness, swelling, or pain around the insertion site. You have fluid or blood coming from the insertion site. Your insertion site feels warm to the touch. You have pus or a bad smell coming from the insertion site. Your skin feels hard and raised around the insertion site. Your PICC dressing has gotten wet or is coming off and you have not been taught how to change it. Get help right away if: You have problems with your PICC, such as  your PICC: Was tugged or pulled and has partially come out. Do not  push the PICC back in. Cannot be flushed, is hard to flush, or leaks around the insertion site when it is flushed. Makes a flushing sound when it is flushed. Appears to have a hole or tear. Is accidentally pulled all the way out. If this happens, cover the insertion site with a gauze dressing. Do not throw the PICC away. Your health care provider will need to check it to be sure the entire catheter came out. You feel your heart racing or skipping beats, or you have chest pain. You have shortness of breath or trouble breathing. You have swelling, redness, warmth, or pain in the arm in which the PICC is placed. You have a red streak going up your arm that  starts under the PICC dressing. These symptoms may be an emergency. Get help right away. Call 911. Do not wait to see if the symptoms will go away. Do not drive yourself to the hospital. Summary A peripherally inserted central catheter (PICC) is a long, thin, flexible tube (catheter) that is put into a vein in the arm or leg. If cared for properly, a PICC can remain in place for many months. Having a PICC can allow you to go home from the hospital sooner and continue treatment at home. The PICC is inserted using a germ-free (sterile) technique by a specially trained health care provider. Only a trained health care provider should remove it. Do not have your blood pressure checked on the arm in which your PICC is placed. Always keep your PICC identification card with you. This information is not intended to replace advice given to you by your health care provider. Make sure you discuss any questions you have with your health care provider. Document Revised: 10/27/2020 Document Reviewed: 10/27/2020 Elsevier Patient Education  2024 [employer].

## 2023-04-30 ENCOUNTER — Inpatient Hospital Stay: Payer: No Typology Code available for payment source

## 2023-04-30 VITALS — BP 113/86 | HR 76 | Temp 98.1°F | Resp 18

## 2023-04-30 DIAGNOSIS — C180 Malignant neoplasm of cecum: Secondary | ICD-10-CM | POA: Diagnosis not present

## 2023-04-30 MED ORDER — SODIUM CHLORIDE 0.9% FLUSH
10.0000 mL | INTRAVENOUS | Status: DC | PRN
  Administered 2023-04-30: 10 mL via INTRAVENOUS

## 2023-04-30 MED ORDER — HEPARIN SOD (PORK) LOCK FLUSH 100 UNIT/ML IV SOLN
500.0000 [IU] | Freq: Once | INTRAVENOUS | Status: AC
  Administered 2023-04-30: 500 [IU] via INTRAVENOUS

## 2023-04-30 NOTE — Patient Instructions (Signed)
 PICC Home Care Guide A peripherally inserted central catheter (PICC) is a form of IV access that allows medicines and IV fluids to be quickly put into the blood and spread throughout the body. The PICC is a long, thin, flexible tube (catheter) that is put into a vein in a person's arm or leg. The catheter ends in a large vein just outside the heart called the superior vena cava (SVC). After the PICC is put in, a chest X-ray may be done to make sure that it is in the right place. A PICC may be placed for different reasons, such as: To give medicines and liquid nutrition. To give IV fluids and blood products. To take blood samples often. If there is trouble placing a peripheral intravenous (PIV) catheter. If cared for properly, a PICC can remain in place for many months. Having a PICC can allow you to go home from the hospital sooner and continue treatment at home. Medicines and PICC care can be managed at home by a family member, caregiver, or home health care team. What are the risks? Generally, having a PICC is safe. However, problems may occur, including: A blood clot (thrombus) forming in or at the end of the PICC. A blood clot forming in a vein (deep vein thrombosis) or traveling to the lung (pulmonary embolism). Inflammation of the vein (phlebitis) in which the PICC is placed. Infection at the insertion site or in the blood. Blood infections from central lines, like PICCs, can be serious and often require a hospital stay. PICC malposition, or PICC movement or poor placement. A break or cut in the PICC. Do not use scissors near the PICC. Nerve or tendon irritation or injury during PICC insertion. How to care for your PICC Please follow the specific guidelines provided by your health care provider. Preventing infection You and any caregivers should wash your hands often with soap and water for at least 20 seconds. Wash hands: Before touching the PICC or the infusion device. Before changing a  bandage (dressing). Do not change the dressing unless you have been taught to do so and have shown you are able to change it safely. Flush the PICC as told. Tell your health care provider right away if the PICC is hard to flush or does not flush. Do not use force to flush the PICC. Use clean and germ-free (sterile) supplies only. Keep the supplies in a dry place. Do not reuse needles, syringes, or any other supplies. Reusing supplies can lead to infection. Keep the PICC dressing dry and secure it with tape if the edges stop sticking to your skin. Check your PICC insertion site every day for signs of infection. Check for: Redness, swelling, or pain. Fluid or blood. Warmth. Pus or a bad smell. Preventing other problems Do not use a syringe that is less than 10 mL to flush the PICC. Do not have your blood pressure checked on the arm in which the PICC is placed. Do not ever pull or tug on the PICC. Keep it secured to your arm with tape or a stretch wrap when not in use. Do not take the PICC out yourself. Only a trained health care provider should remove the PICC. Keep pets and children away from your PICC. How to care for your PICC dressing Keep your PICC dressing clean and dry to prevent infection. Do not take baths, swim, or use a hot tub until your health care provider approves. Ask your health care provider if you can take  showers. You may only be allowed to take sponge baths. When you are allowed to shower: Ask your health care provider to teach you how to wrap the PICC. Cover the PICC with clear plastic wrap and tape to keep it dry while showering. Follow instructions from your health care provider about how to take care of your insertion site and dressing. Make sure you: Wash your hands with soap and water for at least 20 seconds before and after you change your dressing. If soap and water are not available, use hand sanitizer. Change your dressing only if taught to do so by your health care  provider. Your PICC dressing needs to be changed if it becomes loose or wet. Leave stitches (sutures), skin glue, or adhesive strips in place. These skin closures may need to stay in place for 2 weeks or longer. If adhesive strip edges start to loosen and curl up, you may trim the loose edges. Do not remove adhesive strips completely unless your health care provider tells you to do that. Follow these instructions at home: Disposal of supplies Throw away any syringes in a disposal container that is meant for sharp items (sharps container). You can buy a sharps container from a pharmacy, or you can make one by using an empty, hard plastic bottle with a lid. Place any used dressings or infusion bags into a plastic bag. Throw that bag in the trash. General instructions  Always carry your PICC identification card or wear a medical alert bracelet. Keep the tube clamped at all times, unless it is being used. Always carry a smooth-edge clamp with you to clamp the PICC if it breaks. Do not use scissors or sharp objects near the tube. You may bend your arm and move it freely. If your PICC is near or at the bend of your elbow, avoid activity with repeated motion at the elbow. Avoid lifting heavy objects as told by your health care provider. Keep all follow-up visits. This is important. You will need to have your PICC dressing changed at least once a week. Contact a health care provider if: You have pain in your arm, ear, face, or teeth. You have a fever or chills. You have redness, swelling, or pain around the insertion site. You have fluid or blood coming from the insertion site. Your insertion site feels warm to the touch. You have pus or a bad smell coming from the insertion site. Your skin feels hard and raised around the insertion site. Your PICC dressing has gotten wet or is coming off and you have not been taught how to change it. Get help right away if: You have problems with your PICC, such as  your PICC: Was tugged or pulled and has partially come out. Do not  push the PICC back in. Cannot be flushed, is hard to flush, or leaks around the insertion site when it is flushed. Makes a flushing sound when it is flushed. Appears to have a hole or tear. Is accidentally pulled all the way out. If this happens, cover the insertion site with a gauze dressing. Do not throw the PICC away. Your health care provider will need to check it to be sure the entire catheter came out. You feel your heart racing or skipping beats, or you have chest pain. You have shortness of breath or trouble breathing. You have swelling, redness, warmth, or pain in the arm in which the PICC is placed. You have a red streak going up your arm that  starts under the PICC dressing. These symptoms may be an emergency. Get help right away. Call 911. Do not wait to see if the symptoms will go away. Do not drive yourself to the hospital. Summary A peripherally inserted central catheter (PICC) is a long, thin, flexible tube (catheter) that is put into a vein in the arm or leg. If cared for properly, a PICC can remain in place for many months. Having a PICC can allow you to go home from the hospital sooner and continue treatment at home. The PICC is inserted using a germ-free (sterile) technique by a specially trained health care provider. Only a trained health care provider should remove it. Do not have your blood pressure checked on the arm in which your PICC is placed. Always keep your PICC identification card with you. This information is not intended to replace advice given to you by your health care provider. Make sure you discuss any questions you have with your health care provider. Document Revised: 10/27/2020 Document Reviewed: 10/27/2020 Elsevier Patient Education  2024 [employer].

## 2023-05-02 ENCOUNTER — Inpatient Hospital Stay: Payer: No Typology Code available for payment source

## 2023-05-02 VITALS — BP 121/79 | HR 88 | Temp 97.8°F | Resp 18

## 2023-05-02 DIAGNOSIS — C180 Malignant neoplasm of cecum: Secondary | ICD-10-CM | POA: Diagnosis not present

## 2023-05-02 MED ORDER — SODIUM CHLORIDE 0.9% FLUSH
10.0000 mL | INTRAVENOUS | Status: DC | PRN
  Administered 2023-05-02: 10 mL via INTRAVENOUS

## 2023-05-02 MED ORDER — HEPARIN SOD (PORK) LOCK FLUSH 100 UNIT/ML IV SOLN
500.0000 [IU] | Freq: Once | INTRAVENOUS | Status: AC
  Administered 2023-05-02: 500 [IU] via INTRAVENOUS

## 2023-05-02 NOTE — Patient Instructions (Signed)
 PICC Home Care Guide A peripherally inserted central catheter (PICC) is a form of IV access that allows medicines and IV fluids to be quickly put into the blood and spread throughout the body. The PICC is a long, thin, flexible tube (catheter) that is put into a vein in a person's arm or leg. The catheter ends in a large vein just outside the heart called the superior vena cava (SVC). After the PICC is put in, a chest X-ray may be done to make sure that it is in the right place. A PICC may be placed for different reasons, such as: To give medicines and liquid nutrition. To give IV fluids and blood products. To take blood samples often. If there is trouble placing a peripheral intravenous (PIV) catheter. If cared for properly, a PICC can remain in place for many months. Having a PICC can allow you to go home from the hospital sooner and continue treatment at home. Medicines and PICC care can be managed at home by a family member, caregiver, or home health care team. What are the risks? Generally, having a PICC is safe. However, problems may occur, including: A blood clot (thrombus) forming in or at the end of the PICC. A blood clot forming in a vein (deep vein thrombosis) or traveling to the lung (pulmonary embolism). Inflammation of the vein (phlebitis) in which the PICC is placed. Infection at the insertion site or in the blood. Blood infections from central lines, like PICCs, can be serious and often require a hospital stay. PICC malposition, or PICC movement or poor placement. A break or cut in the PICC. Do not use scissors near the PICC. Nerve or tendon irritation or injury during PICC insertion. How to care for your PICC Please follow the specific guidelines provided by your health care provider. Preventing infection You and any caregivers should wash your hands often with soap and water for at least 20 seconds. Wash hands: Before touching the PICC or the infusion device. Before changing a  bandage (dressing). Do not change the dressing unless you have been taught to do so and have shown you are able to change it safely. Flush the PICC as told. Tell your health care provider right away if the PICC is hard to flush or does not flush. Do not use force to flush the PICC. Use clean and germ-free (sterile) supplies only. Keep the supplies in a dry place. Do not reuse needles, syringes, or any other supplies. Reusing supplies can lead to infection. Keep the PICC dressing dry and secure it with tape if the edges stop sticking to your skin. Check your PICC insertion site every day for signs of infection. Check for: Redness, swelling, or pain. Fluid or blood. Warmth. Pus or a bad smell. Preventing other problems Do not use a syringe that is less than 10 mL to flush the PICC. Do not have your blood pressure checked on the arm in which the PICC is placed. Do not ever pull or tug on the PICC. Keep it secured to your arm with tape or a stretch wrap when not in use. Do not take the PICC out yourself. Only a trained health care provider should remove the PICC. Keep pets and children away from your PICC. How to care for your PICC dressing Keep your PICC dressing clean and dry to prevent infection. Do not take baths, swim, or use a hot tub until your health care provider approves. Ask your health care provider if you can take  showers. You may only be allowed to take sponge baths. When you are allowed to shower: Ask your health care provider to teach you how to wrap the PICC. Cover the PICC with clear plastic wrap and tape to keep it dry while showering. Follow instructions from your health care provider about how to take care of your insertion site and dressing. Make sure you: Wash your hands with soap and water for at least 20 seconds before and after you change your dressing. If soap and water are not available, use hand sanitizer. Change your dressing only if taught to do so by your health care  provider. Your PICC dressing needs to be changed if it becomes loose or wet. Leave stitches (sutures), skin glue, or adhesive strips in place. These skin closures may need to stay in place for 2 weeks or longer. If adhesive strip edges start to loosen and curl up, you may trim the loose edges. Do not remove adhesive strips completely unless your health care provider tells you to do that. Follow these instructions at home: Disposal of supplies Throw away any syringes in a disposal container that is meant for sharp items (sharps container). You can buy a sharps container from a pharmacy, or you can make one by using an empty, hard plastic bottle with a lid. Place any used dressings or infusion bags into a plastic bag. Throw that bag in the trash. General instructions  Always carry your PICC identification card or wear a medical alert bracelet. Keep the tube clamped at all times, unless it is being used. Always carry a smooth-edge clamp with you to clamp the PICC if it breaks. Do not use scissors or sharp objects near the tube. You may bend your arm and move it freely. If your PICC is near or at the bend of your elbow, avoid activity with repeated motion at the elbow. Avoid lifting heavy objects as told by your health care provider. Keep all follow-up visits. This is important. You will need to have your PICC dressing changed at least once a week. Contact a health care provider if: You have pain in your arm, ear, face, or teeth. You have a fever or chills. You have redness, swelling, or pain around the insertion site. You have fluid or blood coming from the insertion site. Your insertion site feels warm to the touch. You have pus or a bad smell coming from the insertion site. Your skin feels hard and raised around the insertion site. Your PICC dressing has gotten wet or is coming off and you have not been taught how to change it. Get help right away if: You have problems with your PICC, such as  your PICC: Was tugged or pulled and has partially come out. Do not  push the PICC back in. Cannot be flushed, is hard to flush, or leaks around the insertion site when it is flushed. Makes a flushing sound when it is flushed. Appears to have a hole or tear. Is accidentally pulled all the way out. If this happens, cover the insertion site with a gauze dressing. Do not throw the PICC away. Your health care provider will need to check it to be sure the entire catheter came out. You feel your heart racing or skipping beats, or you have chest pain. You have shortness of breath or trouble breathing. You have swelling, redness, warmth, or pain in the arm in which the PICC is placed. You have a red streak going up your arm that  starts under the PICC dressing. These symptoms may be an emergency. Get help right away. Call 911. Do not wait to see if the symptoms will go away. Do not drive yourself to the hospital. Summary A peripherally inserted central catheter (PICC) is a long, thin, flexible tube (catheter) that is put into a vein in the arm or leg. If cared for properly, a PICC can remain in place for many months. Having a PICC can allow you to go home from the hospital sooner and continue treatment at home. The PICC is inserted using a germ-free (sterile) technique by a specially trained health care provider. Only a trained health care provider should remove it. Do not have your blood pressure checked on the arm in which your PICC is placed. Always keep your PICC identification card with you. This information is not intended to replace advice given to you by your health care provider. Make sure you discuss any questions you have with your health care provider. Document Revised: 10/27/2020 Document Reviewed: 10/27/2020 Elsevier Patient Education  2024 [employer].

## 2023-05-04 ENCOUNTER — Telehealth: Payer: Self-pay | Admitting: *Deleted

## 2023-05-04 ENCOUNTER — Inpatient Hospital Stay: Payer: No Typology Code available for payment source

## 2023-05-04 ENCOUNTER — Other Ambulatory Visit: Payer: Self-pay | Admitting: *Deleted

## 2023-05-04 DIAGNOSIS — Z452 Encounter for adjustment and management of vascular access device: Secondary | ICD-10-CM

## 2023-05-04 DIAGNOSIS — C787 Secondary malignant neoplasm of liver and intrahepatic bile duct: Secondary | ICD-10-CM

## 2023-05-04 DIAGNOSIS — T829XXA Unspecified complication of cardiac and vascular prosthetic device, implant and graft, initial encounter: Secondary | ICD-10-CM

## 2023-05-04 DIAGNOSIS — I2690 Septic pulmonary embolism without acute cor pulmonale: Secondary | ICD-10-CM

## 2023-05-04 DIAGNOSIS — Z95828 Presence of other vascular implants and grafts: Secondary | ICD-10-CM

## 2023-05-04 DIAGNOSIS — E876 Hypokalemia: Secondary | ICD-10-CM

## 2023-05-04 DIAGNOSIS — D509 Iron deficiency anemia, unspecified: Secondary | ICD-10-CM

## 2023-05-04 DIAGNOSIS — K56609 Unspecified intestinal obstruction, unspecified as to partial versus complete obstruction: Secondary | ICD-10-CM

## 2023-05-04 DIAGNOSIS — R232 Flushing: Secondary | ICD-10-CM

## 2023-05-04 DIAGNOSIS — T451X5A Adverse effect of antineoplastic and immunosuppressive drugs, initial encounter: Secondary | ICD-10-CM

## 2023-05-04 DIAGNOSIS — C180 Malignant neoplasm of cecum: Secondary | ICD-10-CM

## 2023-05-04 NOTE — Telephone Encounter (Signed)
 Message received from patient stating that she is at [facility] for a Dr.'s appt right now and that upon them drawing blood via her PICC line, the PICC was noted to have a crack, and is leaking at the purple port line.  [facility] IR notified and states that PICC can be exchanged on Monday, 05/07/23 at 0800. Pt notified and is ok wit this date and time for PICC exchange. Pt states that the clamp is below the crack and is not leaking at this time.  Pt verbalizes an understanding to go to the ER over the weekend if any further bleeding or issues arise with the PICC line.

## 2023-05-07 ENCOUNTER — Ambulatory Visit (HOSPITAL_COMMUNITY)
Admission: RE | Admit: 2023-05-07 | Discharge: 2023-05-07 | Discharge status: Home / Self Care | Payer: No Typology Code available for payment source | Source: Ambulatory Visit | Attending: Hematology & Oncology | Admitting: Hematology & Oncology

## 2023-05-07 ENCOUNTER — Telehealth: Payer: Self-pay | Admitting: *Deleted

## 2023-05-07 ENCOUNTER — Inpatient Hospital Stay: Payer: No Typology Code available for payment source

## 2023-05-07 DIAGNOSIS — Y839 Surgical procedure, unspecified as the cause of abnormal reaction of the patient, or of later complication, without mention of misadventure at the time of the procedure: Secondary | ICD-10-CM | POA: Diagnosis not present

## 2023-05-07 DIAGNOSIS — D701 Agranulocytosis secondary to cancer chemotherapy: Secondary | ICD-10-CM

## 2023-05-07 DIAGNOSIS — I2690 Septic pulmonary embolism without acute cor pulmonale: Secondary | ICD-10-CM | POA: Insufficient documentation

## 2023-05-07 DIAGNOSIS — R232 Flushing: Secondary | ICD-10-CM | POA: Insufficient documentation

## 2023-05-07 DIAGNOSIS — Z95828 Presence of other vascular implants and grafts: Secondary | ICD-10-CM

## 2023-05-07 DIAGNOSIS — C787 Secondary malignant neoplasm of liver and intrahepatic bile duct: Secondary | ICD-10-CM | POA: Insufficient documentation

## 2023-05-07 DIAGNOSIS — C180 Malignant neoplasm of cecum: Secondary | ICD-10-CM | POA: Diagnosis not present

## 2023-05-07 DIAGNOSIS — E876 Hypokalemia: Secondary | ICD-10-CM | POA: Diagnosis not present

## 2023-05-07 DIAGNOSIS — T82514A Breakdown (mechanical) of infusion catheter, initial encounter: Secondary | ICD-10-CM | POA: Insufficient documentation

## 2023-05-07 DIAGNOSIS — K56609 Unspecified intestinal obstruction, unspecified as to partial versus complete obstruction: Secondary | ICD-10-CM | POA: Diagnosis not present

## 2023-05-07 DIAGNOSIS — Z452 Encounter for adjustment and management of vascular access device: Secondary | ICD-10-CM

## 2023-05-07 DIAGNOSIS — D509 Iron deficiency anemia, unspecified: Secondary | ICD-10-CM | POA: Diagnosis not present

## 2023-05-07 DIAGNOSIS — T829XXA Unspecified complication of cardiac and vascular prosthetic device, implant and graft, initial encounter: Secondary | ICD-10-CM

## 2023-05-07 MED ORDER — HEPARIN SOD (PORK) LOCK FLUSH 100 UNIT/ML IV SOLN
INTRAVENOUS | Status: AC
  Filled 2023-05-07: qty 5

## 2023-05-07 MED ORDER — IOHEXOL 300 MG/ML  SOLN
50.0000 mL | Freq: Once | INTRAMUSCULAR | Status: AC | PRN
  Administered 2023-05-07: 10 mL via INTRAVENOUS

## 2023-05-07 MED ORDER — HEPARIN SOD (PORK) LOCK FLUSH 100 UNIT/ML IV SOLN
500.0000 [IU] | Freq: Once | INTRAVENOUS | Status: AC
  Administered 2023-05-07: 500 [IU] via INTRAVENOUS

## 2023-05-07 MED ORDER — LIDOCAINE HCL 1 % IJ SOLN
INTRAMUSCULAR | Status: AC
  Filled 2023-05-07: qty 20

## 2023-05-07 MED ORDER — LIDOCAINE HCL 1 % IJ SOLN
20.0000 mL | Freq: Once | INTRAMUSCULAR | Status: AC
  Administered 2023-05-07: 3 mL via INTRADERMAL

## 2023-05-07 NOTE — Procedures (Signed)
 Interventional Radiology Procedure Note  Procedure: LUE SL PICC    Complications: None  Estimated Blood Loss:  MIN  Findings: RT SUBCLAVIAN OCCLUDED  NEW LUE PICC PLACED. TIP SVCRA     Sharen Counter, MD

## 2023-05-07 NOTE — Telephone Encounter (Signed)
 Patient called to let us know that she had her right PICC taken out and placed in her left arm.  They changed the dressing and flushed it so appt here taken out.  Confirmed Wed appt with Patient

## 2023-05-08 ENCOUNTER — Inpatient Hospital Stay: Payer: No Typology Code available for payment source

## 2023-05-08 NOTE — Progress Notes (Signed)
 Pt arrived to office without an appointment, due to she woke up this morning at 0500 with new left PICC line completely out.  PICC dressing intact with old bloody drainage. Pt states no blood on bed sheets. Pt did bring PICC line in to office in clear plastic bag.  PICC line intact and measured at 38 inches. PICC line dressing removed to left arm and occlusive sterile dressing with vaseline gauze applied.  Pt instructed to leave dressing on to left arm for 24 hours. Teach back done. Dr. Timmy notified, no further action at this time. IR notified and safety zone placed.

## 2023-05-09 ENCOUNTER — Inpatient Hospital Stay: Payer: No Typology Code available for payment source

## 2023-05-11 ENCOUNTER — Inpatient Hospital Stay: Payer: No Typology Code available for payment source

## 2023-05-14 ENCOUNTER — Inpatient Hospital Stay: Payer: No Typology Code available for payment source

## 2023-05-16 ENCOUNTER — Inpatient Hospital Stay: Payer: No Typology Code available for payment source

## 2023-05-18 ENCOUNTER — Inpatient Hospital Stay: Payer: No Typology Code available for payment source

## 2023-05-21 ENCOUNTER — Inpatient Hospital Stay: Payer: No Typology Code available for payment source

## 2023-05-23 ENCOUNTER — Inpatient Hospital Stay: Payer: No Typology Code available for payment source

## 2023-05-25 ENCOUNTER — Inpatient Hospital Stay: Payer: No Typology Code available for payment source

## 2023-05-28 ENCOUNTER — Inpatient Hospital Stay (HOSPITAL_BASED_OUTPATIENT_CLINIC_OR_DEPARTMENT_OTHER): Payer: No Typology Code available for payment source | Admitting: Hematology & Oncology

## 2023-05-28 ENCOUNTER — Other Ambulatory Visit: Payer: Self-pay

## 2023-05-28 ENCOUNTER — Inpatient Hospital Stay: Payer: No Typology Code available for payment source

## 2023-05-28 ENCOUNTER — Inpatient Hospital Stay: Payer: No Typology Code available for payment source | Attending: Hematology & Oncology

## 2023-05-28 ENCOUNTER — Encounter: Payer: Self-pay | Admitting: Hematology & Oncology

## 2023-05-28 VITALS — BP 119/87 | HR 94 | Temp 98.0°F | Resp 19 | Ht 64.0 in | Wt 260.0 lb

## 2023-05-28 DIAGNOSIS — C180 Malignant neoplasm of cecum: Secondary | ICD-10-CM

## 2023-05-28 DIAGNOSIS — Z7901 Long term (current) use of anticoagulants: Secondary | ICD-10-CM | POA: Diagnosis not present

## 2023-05-28 DIAGNOSIS — C787 Secondary malignant neoplasm of liver and intrahepatic bile duct: Secondary | ICD-10-CM | POA: Diagnosis not present

## 2023-05-28 DIAGNOSIS — I2699 Other pulmonary embolism without acute cor pulmonale: Secondary | ICD-10-CM | POA: Diagnosis not present

## 2023-05-28 DIAGNOSIS — I82411 Acute embolism and thrombosis of right femoral vein: Secondary | ICD-10-CM | POA: Insufficient documentation

## 2023-05-28 LAB — CBC WITH DIFFERENTIAL (CANCER CENTER ONLY)
Abs Immature Granulocytes: 0.01 10*3/uL (ref 0.00–0.07)
Basophils Absolute: 0.1 10*3/uL (ref 0.0–0.1)
Basophils Relative: 1 %
Eosinophils Absolute: 0.5 10*3/uL (ref 0.0–0.5)
Eosinophils Relative: 9 %
HCT: 40.3 % (ref 36.0–46.0)
Hemoglobin: 12.8 g/dL (ref 12.0–15.0)
Immature Granulocytes: 0 %
Lymphocytes Relative: 29 %
Lymphs Abs: 1.5 10*3/uL (ref 0.7–4.0)
MCH: 29.8 pg (ref 26.0–34.0)
MCHC: 31.8 g/dL (ref 30.0–36.0)
MCV: 93.9 fL (ref 80.0–100.0)
Monocytes Absolute: 0.5 10*3/uL (ref 0.1–1.0)
Monocytes Relative: 10 %
Neutro Abs: 2.8 10*3/uL (ref 1.7–7.7)
Neutrophils Relative %: 51 %
Platelet Count: 359 10*3/uL (ref 150–400)
RBC: 4.29 MIL/uL (ref 3.87–5.11)
RDW: 14.6 % (ref 11.5–15.5)
WBC Count: 5.4 10*3/uL (ref 4.0–10.5)
nRBC: 0 % (ref 0.0–0.2)

## 2023-05-28 LAB — CMP (CANCER CENTER ONLY)
ALT: 16 U/L (ref 0–44)
AST: 26 U/L (ref 15–41)
Albumin: 3.9 g/dL (ref 3.5–5.0)
Alkaline Phosphatase: 99 U/L (ref 38–126)
Anion gap: 9 (ref 5–15)
BUN: 8 mg/dL (ref 6–20)
CO2: 30 mmol/L (ref 22–32)
Calcium: 9.4 mg/dL (ref 8.9–10.3)
Chloride: 102 mmol/L (ref 98–111)
Creatinine: 0.67 mg/dL (ref 0.44–1.00)
GFR, Estimated: >60 mL/min (ref >60)
Glucose, Bld: 95 mg/dL (ref 70–99)
Potassium: 4 mmol/L (ref 3.5–5.1)
Sodium: 141 mmol/L (ref 135–145)
Total Bilirubin: 0.5 mg/dL (ref 0.0–1.2)
Total Protein: 7.6 g/dL (ref 6.5–8.1)

## 2023-05-28 LAB — CEA (ACCESS): CEA (CHCC): 206.4 ng/mL — ABNORMAL HIGH (ref 0.00–5.00)

## 2023-05-28 NOTE — Progress Notes (Signed)
Hematology and Oncology Follow Up Visit  Lauren Mcpherson [id number] 1965-09-24 58 y.o. 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  Start on 05/31/2023   Interim History:  Lauren Mcpherson is here today for follow-up.  She did have surgery to have the chemotherapy pump planted.  This was on 05/09/2023.  She did very well with this.  She has been to [facility] this Thursday and is to start treatment with the hepatic 5-FU.  Her last CEA back in December was 140.  He will be interesting to see how this looks now.  She has had no problems with cough.  There is been no nausea or vomiting.  She has little bit of of abdominal pain secondary to the pump placement.  There is no issues with bowels or bladder.  She has had no bright red blood per rectum or melena.  She has had no fever.  Her son is in town this past weekend.  She really enjoyed having him home.  Overall, I would say that her performance status is ECOG 1.    Wt Readings from Last 3 Encounters:  05/28/23 260 lb (117.9 kg)  04/24/23 267 lb (121.1 kg)  03/26/23 267 lb 1.9 oz (121.2 kg)     Medications:  Allergies as of 05/28/2023   No Known Allergies      Medication List        Accurate as of May 28, 2023 10:08 AM. If you have any questions, ask your nurse or doctor.          rivaroxaban 10 MG Tabs tablet Commonly known as: XARELTO Take 1 tablet (10 mg total)  by mouth daily with supper.        Allergies: No Known Allergies  Past Medical History, Surgical history, Social history, and Family History were reviewed and updated.  Review of Systems:  Review of Systems  Constitutional: Negative.   HENT: Negative.    Eyes: Negative.   Respiratory: Negative.    Cardiovascular: Negative.   Gastrointestinal: Negative.   Genitourinary: Negative.   Musculoskeletal: Negative.   Skin: Negative.   Neurological: Negative.   Endo/Heme/Allergies: Negative.   Psychiatric/Behavioral: Negative.       Physical Exam:  height is 5\' 4"  (1.626 m) and weight is 260 lb (117.9 kg). Her oral temperature is 98 F (36.7 C). Her blood pressure is 119/87 and her pulse is 94. Her respiration is 19 and oxygen saturation is 100%.   Wt Readings from Last 3 Encounters:  05/28/23 260 lb (117.9 kg)  04/24/23 267 lb (121.1 kg)  03/26/23 267 lb 1.9 oz (121.2 kg)    Physical Exam Vitals reviewed.  HENT:     Head: Normocephalic and atraumatic.  Eyes:     Pupils: Pupils are equal, round, and reactive to light.  Cardiovascular:     Rate and Rhythm: Normal rate and regular rhythm.     Heart  sounds: Normal heart sounds.  Pulmonary:     Effort: Pulmonary effort is normal.     Breath sounds: Normal breath sounds.  Abdominal:     General: Bowel sounds are normal.     Palpations: Abdomen is soft.     Comments: Abdomen is somewhat obese.  She has a laparotomy scar for the pump placement.  The pump can be palpated just to the left of the umbilicus.  Is a little bit tender.  There is no redness.  There is no fluid wave.  There is no obvious abdominal mass.  There is no hepatosplenomegaly.  Musculoskeletal:        General: No tenderness or deformity. Normal range of motion.     Cervical back: Normal range of motion.  Lymphadenopathy:     Cervical: No cervical adenopathy.  Skin:    General: Skin is warm and dry.     Findings: No erythema or rash.  Neurological:      Mental Status: She is alert and oriented to person, place, and time.  Psychiatric:        Behavior: Behavior normal.        Thought Content: Thought content normal.        Judgment: Judgment normal.     Lab Results  Component Value Date   WBC 5.4 05/28/2023   HGB 12.8 05/28/2023   HCT 40.3 05/28/2023   MCV 93.9 05/28/2023   PLT 359 05/28/2023   Lab Results  Component Value Date   FERRITIN 462 (H) 04/02/2023   IRON 71 04/02/2023   TIBC 273 04/02/2023   UIBC 202 04/02/2023   IRONPCTSAT 26 04/02/2023   Lab Results  Component Value Date   RETICCTPCT 1.5 12/14/2021   RBC 4.29 05/28/2023   No results found for: "KPAFRELGTCHN", "LAMBDASER", "KAPLAMBRATIO" No results found for: "IGGSERUM", "IGA", "IGMSERUM" No results found for: "TOTALPROTELP", "ALBUMINELP", "A1GS", "A2GS", "BETS", "BETA2SER", "GAMS", "MSPIKE", "SPEI"   Chemistry      Component Value Date/Time   NA 141 05/28/2023 0901   K 4.0 05/28/2023 0901   CL 102 05/28/2023 0901   CO2 30 05/28/2023 0901   BUN 8 05/28/2023 0901   CREATININE 0.67 05/28/2023 0901      Component Value Date/Time   CALCIUM 9.4 05/28/2023 0901   ALKPHOS 99 05/28/2023 0901   AST 26 05/28/2023 0901   ALT 16 05/28/2023 0901   BILITOT 0.5 05/28/2023 0901       Impression and Plan: Lauren Mcpherson is a very pleasant 58 yo African American female with metastatic 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  Hopefully, the intrahepatic 5-FU will work.  I know that Lauren Mcpherson will do a great job managing this.  I am not sure why we need to do other scans on her.  We will probably have to get her to have several cycles of treatment first.  I would think that the CEA would be a great way of measuring for her disease response.  We will go ahead and plan to get her back to see [place] in another 6 weeks.   Lauren Macho, MD 2/3/202510:08 AM

## 2023-06-14 NOTE — Addendum Note (Signed)
Encounter addended by: Edward Qualia on: 06/14/2023 9:48 AM  Actions taken: Imaging Exam ended

## 2023-07-02 ENCOUNTER — Inpatient Hospital Stay: Payer: No Typology Code available for payment source

## 2023-07-02 ENCOUNTER — Inpatient Hospital Stay: Payer: No Typology Code available for payment source | Admitting: Hematology & Oncology

## 2023-07-05 ENCOUNTER — Inpatient Hospital Stay: Attending: Hematology & Oncology

## 2023-07-05 ENCOUNTER — Encounter: Payer: Self-pay | Admitting: Hematology & Oncology

## 2023-07-05 ENCOUNTER — Other Ambulatory Visit: Payer: Self-pay

## 2023-07-05 ENCOUNTER — Inpatient Hospital Stay

## 2023-07-05 ENCOUNTER — Inpatient Hospital Stay (HOSPITAL_BASED_OUTPATIENT_CLINIC_OR_DEPARTMENT_OTHER): Admitting: Hematology & Oncology

## 2023-07-05 VITALS — BP 108/70 | HR 70 | Temp 98.6°F | Resp 17

## 2023-07-05 DIAGNOSIS — I2699 Other pulmonary embolism without acute cor pulmonale: Secondary | ICD-10-CM | POA: Diagnosis not present

## 2023-07-05 DIAGNOSIS — C180 Malignant neoplasm of cecum: Secondary | ICD-10-CM | POA: Insufficient documentation

## 2023-07-05 DIAGNOSIS — Z452 Encounter for adjustment and management of vascular access device: Secondary | ICD-10-CM | POA: Insufficient documentation

## 2023-07-05 DIAGNOSIS — C787 Secondary malignant neoplasm of liver and intrahepatic bile duct: Secondary | ICD-10-CM | POA: Insufficient documentation

## 2023-07-05 DIAGNOSIS — I82411 Acute embolism and thrombosis of right femoral vein: Secondary | ICD-10-CM | POA: Insufficient documentation

## 2023-07-05 DIAGNOSIS — Z7901 Long term (current) use of anticoagulants: Secondary | ICD-10-CM | POA: Diagnosis not present

## 2023-07-05 DIAGNOSIS — I2690 Septic pulmonary embolism without acute cor pulmonale: Secondary | ICD-10-CM

## 2023-07-05 LAB — FERRITIN: Ferritin: 423 ng/mL — ABNORMAL HIGH (ref 11–307)

## 2023-07-05 LAB — CBC WITH DIFFERENTIAL (CANCER CENTER ONLY)
Abs Immature Granulocytes: 0 K/uL (ref 0.00–0.07)
Basophils Absolute: 0 K/uL (ref 0.0–0.1)
Basophils Relative: 0 %
Eosinophils Absolute: 0.2 K/uL (ref 0.0–0.5)
Eosinophils Relative: 5 %
HCT: 36.7 % (ref 36.0–46.0)
Hemoglobin: 11.7 g/dL — ABNORMAL LOW (ref 12.0–15.0)
Immature Granulocytes: 0 %
Lymphocytes Relative: 37 %
Lymphs Abs: 1.4 K/uL (ref 0.7–4.0)
MCH: 29.1 pg (ref 26.0–34.0)
MCHC: 31.9 g/dL (ref 30.0–36.0)
MCV: 91.3 fL (ref 80.0–100.0)
Monocytes Absolute: 0.2 K/uL (ref 0.1–1.0)
Monocytes Relative: 6 %
Neutro Abs: 1.9 K/uL (ref 1.7–7.7)
Neutrophils Relative %: 52 %
Platelet Count: 257 K/uL (ref 150–400)
RBC: 4.02 MIL/uL (ref 3.87–5.11)
RDW: 15.7 % — ABNORMAL HIGH (ref 11.5–15.5)
Smear Review: NORMAL
WBC Count: 3.7 K/uL — ABNORMAL LOW (ref 4.0–10.5)
nRBC: 0 % (ref 0.0–0.2)

## 2023-07-05 LAB — CMP (CANCER CENTER ONLY)
ALT: 16 U/L (ref 0–44)
AST: 17 U/L (ref 15–41)
Albumin: 3.7 g/dL (ref 3.5–5.0)
Alkaline Phosphatase: 94 U/L (ref 38–126)
Anion gap: 10 (ref 5–15)
BUN: 10 mg/dL (ref 6–20)
CO2: 27 mmol/L (ref 22–32)
Calcium: 8.6 mg/dL — ABNORMAL LOW (ref 8.9–10.3)
Chloride: 104 mmol/L (ref 98–111)
Creatinine: 0.69 mg/dL (ref 0.44–1.00)
GFR, Estimated: >60 mL/min (ref >60)
Glucose, Bld: 127 mg/dL — ABNORMAL HIGH (ref 70–99)
Potassium: 3.8 mmol/L (ref 3.5–5.1)
Sodium: 141 mmol/L (ref 135–145)
Total Bilirubin: 0.4 mg/dL (ref 0.0–1.2)
Total Protein: 6.5 g/dL (ref 6.5–8.1)

## 2023-07-05 LAB — IRON AND IRON BINDING CAPACITY (CC-WL,HP ONLY)
Iron: 55 ug/dL (ref 28–170)
Saturation Ratios: 26 % (ref 10.4–31.8)
TIBC: 216 ug/dL — ABNORMAL LOW (ref 250–450)
UIBC: 161 ug/dL (ref 148–442)

## 2023-07-05 LAB — LACTATE DEHYDROGENASE: LDH: 144 U/L (ref 98–192)

## 2023-07-05 MED ORDER — SODIUM CHLORIDE 0.9% FLUSH
10.0000 mL | Freq: Once | INTRAVENOUS | Status: AC
Start: 2023-07-05 — End: 2023-07-05
  Administered 2023-07-05: 10 mL via INTRAVENOUS

## 2023-07-05 MED ORDER — RIVAROXABAN 10 MG PO TABS: 10.0000 mg | ORAL_TABLET | Freq: Every day | ORAL | 5 refills | Status: AC

## 2023-07-05 MED ORDER — HEPARIN SOD (PORK) LOCK FLUSH 100 UNIT/ML IV SOLN
500.0000 [IU] | Freq: Once | INTRAVENOUS | Status: AC
Start: 2023-07-05 — End: 2023-07-05
  Administered 2023-07-05: 500 [IU] via INTRAVENOUS

## 2023-07-05 NOTE — Progress Notes (Signed)
 Hematology and Oncology Follow Up Visit  Lauren Mcpherson [id number] 1965-07-20 58 y.o. 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  Start on 05/31/2023   Interim History:  Lauren Mcpherson is here today for follow-up.  She is doing pretty well.  She is at [facility] getting treated.  She is getting HAI into the liver.  She is also getting systemic chemotherapy.  I think she has had 1 treatment of each.  I am unsure when they are planning to do another scan on her.  When we last saw her back in February, her CEA was up to 206.  It will be interesting to see what the level looks like right now.  She really is tolerated treatment well.  She is having no abdominal pain.  She is eating well.  Her weight has not gone down.  She has had no change in bowel or bladder habits.  She has had no problems with cough or shortness of breath.  She has had no rashes.  She has had no leg swelling..  She also has a new Port-A-Cath in.  This was done at [facility].  She is on Xarelto.  I think she is doing pretty well on Xarelto.  She is on a maintenance dose of Xarelto.  She has had no fever.  She has had no bleeding.  Overall, I would say that her performance status is probably ECOG 1.   Wt Readings from Last 3 Encounters:  05/28/23 260 lb (117.9 kg)  04/24/23 267 lb (121.1 kg)  03/26/23 267 lb 1.9 oz (121.2 kg)     Medications:  Allergies as of 07/05/2023    No Known Allergies      Medication List        Accurate as of July 05, 2023  1:33 PM. If you have any questions, ask your nurse or doctor.          rivaroxaban 10 MG Tabs tablet Commonly known as: XARELTO Take 1 tablet (10 mg total) by mouth daily with supper.        Allergies: No Known Allergies  Past Medical History, Surgical history, Social history, and Family History were reviewed and updated.  Review of Systems:  Review of Systems  Constitutional: Negative.   HENT: Negative.    Eyes: Negative.   Respiratory: Negative.    Cardiovascular: Negative.   Gastrointestinal: Negative.   Genitourinary: Negative.   Musculoskeletal: Negative.   Skin: Negative.   Neurological: Negative.   Endo/Heme/Allergies: Negative.   Psychiatric/Behavioral: Negative.       Physical Exam:  oral temperature is 98.6 F (37 C). Her blood pressure is 108/70 and her pulse is 70. Her respiration is 17 and oxygen saturation is 100%.   Wt Readings from Last 3 Encounters:  05/28/23 260 lb (117.9 kg)  04/24/23 267 lb (121.1 kg)  03/26/23 267 lb 1.9 oz (121.2 kg)    Physical  Exam Vitals reviewed.  HENT:     Head: Normocephalic and atraumatic.  Eyes:     Pupils: Pupils are equal, round, and reactive to light.  Cardiovascular:     Rate and Rhythm: Normal rate and regular rhythm.     Heart sounds: Normal heart sounds.  Pulmonary:     Effort: Pulmonary effort is normal.     Breath sounds: Normal breath sounds.  Abdominal:     General: Bowel sounds are normal.     Palpations: Abdomen is soft.     Comments: Abdomen is somewhat obese.  She has a laparotomy scar for the pump placement.  The pump can be palpated just to the left of the umbilicus.  Is a little bit tender.  There is no redness.  There is no fluid wave.  There is no obvious abdominal mass.  There is no hepatosplenomegaly.  Musculoskeletal:        General: No tenderness or deformity. Normal range of motion.     Cervical  back: Normal range of motion.  Lymphadenopathy:     Cervical: No cervical adenopathy.  Skin:    General: Skin is warm and dry.     Findings: No erythema or rash.  Neurological:     Mental Status: She is alert and oriented to person, place, and time.  Psychiatric:        Behavior: Behavior normal.        Thought Content: Thought content normal.        Judgment: Judgment normal.     Lab Results  Component Value Date   WBC 3.7 (L) 07/05/2023   HGB 11.7 (L) 07/05/2023   HCT 36.7 07/05/2023   MCV 91.3 07/05/2023   PLT 257 07/05/2023   Lab Results  Component Value Date   FERRITIN 462 (H) 04/02/2023   IRON 71 04/02/2023   TIBC 273 04/02/2023   UIBC 202 04/02/2023   IRONPCTSAT 26 04/02/2023   Lab Results  Component Value Date   RETICCTPCT 1.5 12/14/2021   RBC 4.02 07/05/2023   No results found for: "KPAFRELGTCHN", "LAMBDASER", "KAPLAMBRATIO" No results found for: "IGGSERUM", "IGA", "IGMSERUM" No results found for: "TOTALPROTELP", "ALBUMINELP", "A1GS", "A2GS", "BETS", "BETA2SER", "GAMS", "MSPIKE", "SPEI"   Chemistry      Component Value Date/Time   NA 141 07/05/2023 1215   K 3.8 07/05/2023 1215   CL 104 07/05/2023 1215   CO2 27 07/05/2023 1215   BUN 10 07/05/2023 1215   CREATININE 0.69 07/05/2023 1215      Component Value Date/Time   CALCIUM 8.6 (L) 07/05/2023 1215   ALKPHOS 94 07/05/2023 1215   AST 17 07/05/2023 1215   ALT 16 07/05/2023 1215   BILITOT 0.4 07/05/2023 1215       Impression and Plan: Lauren Mcpherson is a very pleasant 58 yo African American female with metastatic 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  I am so glad that [facility] [place], as always, are doing a fantastic job with her.  They are so thorough.  I am sure that they are being aggressive with her.  She still is in great shape.  Again, it will be interesting to see what the CEA level is.  At this point, we will plan to get her back in about 6 weeks or so.  We will we will be more happy to see  her if there is any problems before that time.   [name], MD 3/13/20251:33 PM

## 2023-07-05 NOTE — Patient Instructions (Signed)

## 2023-07-09 ENCOUNTER — Other Ambulatory Visit: Payer: Self-pay | Admitting: Hematology & Oncology

## 2023-07-09 DIAGNOSIS — I2690 Septic pulmonary embolism without acute cor pulmonale: Secondary | ICD-10-CM

## 2023-07-09 DIAGNOSIS — C180 Malignant neoplasm of cecum: Secondary | ICD-10-CM

## 2023-08-23 ENCOUNTER — Inpatient Hospital Stay

## 2023-08-23 ENCOUNTER — Inpatient Hospital Stay: Admitting: Hematology & Oncology

## 2023-08-23 ENCOUNTER — Telehealth: Payer: Self-pay

## 2023-08-23 NOTE — Telephone Encounter (Signed)
 Pt called to say she was too fatigued to make her appointment today. Pt states she is receiving chemotherapy treatment with [facility] and being followed by them. Pt states she has CT scan on 09/14/2023 and will call to let us  know of the results. Pt states she will call when it is time for us  to resume care from [facility]. Pt states she loves this office but doesn't want to have to keep up with too many appointments. Dr. Maria Shiner aware and in agreement with plan.

## 2023-09-14 ENCOUNTER — Other Ambulatory Visit (HOSPITAL_COMMUNITY): Payer: Self-pay

## 2023-12-01 ENCOUNTER — Encounter (HOSPITAL_BASED_OUTPATIENT_CLINIC_OR_DEPARTMENT_OTHER): Payer: Self-pay | Admitting: Emergency Medicine

## 2023-12-01 ENCOUNTER — Emergency Department (HOSPITAL_BASED_OUTPATIENT_CLINIC_OR_DEPARTMENT_OTHER)

## 2023-12-01 ENCOUNTER — Encounter: Payer: Self-pay | Admitting: Family

## 2023-12-01 ENCOUNTER — Emergency Department (HOSPITAL_BASED_OUTPATIENT_CLINIC_OR_DEPARTMENT_OTHER)
Admission: EM | Admit: 2023-12-01 | Discharge: 2023-12-01 | Disposition: A | Discharge status: Home / Self Care | Attending: Emergency Medicine | Admitting: Emergency Medicine

## 2023-12-01 DIAGNOSIS — R1013 Epigastric pain: Secondary | ICD-10-CM

## 2023-12-01 DIAGNOSIS — C189 Malignant neoplasm of colon, unspecified: Secondary | ICD-10-CM

## 2023-12-01 DIAGNOSIS — Z7901 Long term (current) use of anticoagulants: Secondary | ICD-10-CM | POA: Insufficient documentation

## 2023-12-01 DIAGNOSIS — R1012 Left upper quadrant pain: Secondary | ICD-10-CM | POA: Diagnosis not present

## 2023-12-01 DIAGNOSIS — R112 Nausea with vomiting, unspecified: Secondary | ICD-10-CM | POA: Diagnosis not present

## 2023-12-01 DIAGNOSIS — R1011 Right upper quadrant pain: Secondary | ICD-10-CM | POA: Insufficient documentation

## 2023-12-01 DIAGNOSIS — Z87891 Personal history of nicotine dependence: Secondary | ICD-10-CM | POA: Diagnosis not present

## 2023-12-01 LAB — COMPREHENSIVE METABOLIC PANEL WITH GFR
ALT: 18 U/L (ref 0–44)
AST: 25 U/L (ref 15–41)
Albumin: 3.9 g/dL (ref 3.5–5.0)
Alkaline Phosphatase: 169 U/L — ABNORMAL HIGH (ref 38–126)
Anion gap: 16 — ABNORMAL HIGH (ref 5–15)
BUN: 11 mg/dL (ref 6–20)
CO2: 24 mmol/L (ref 22–32)
Calcium: 9.4 mg/dL (ref 8.9–10.3)
Chloride: 96 mmol/L — ABNORMAL LOW (ref 98–111)
Creatinine, Ser: 0.81 mg/dL (ref 0.44–1.00)
GFR, Estimated: >60 mL/min (ref >60)
Glucose, Bld: 124 mg/dL — ABNORMAL HIGH (ref 70–99)
Potassium: 3.3 mmol/L — ABNORMAL LOW (ref 3.5–5.1)
Sodium: 137 mmol/L (ref 135–145)
Total Bilirubin: 0.4 mg/dL (ref 0.0–1.2)
Total Protein: 7.3 g/dL (ref 6.5–8.1)

## 2023-12-01 LAB — URINALYSIS, ROUTINE W REFLEX MICROSCOPIC
Glucose, UA: NEGATIVE mg/dL
Hgb urine dipstick: NEGATIVE
Ketones, ur: 40 mg/dL — AB
Leukocytes,Ua: NEGATIVE
Nitrite: NEGATIVE
Protein, ur: 30 mg/dL — AB
Specific Gravity, Urine: 1.02 (ref 1.005–1.030)
pH: 6 (ref 5.0–8.0)

## 2023-12-01 LAB — CBC
HCT: 43 % (ref 36.0–46.0)
Hemoglobin: 13.9 g/dL (ref 12.0–15.0)
MCH: 30.6 pg (ref 26.0–34.0)
MCHC: 32.3 g/dL (ref 30.0–36.0)
MCV: 94.7 fL (ref 80.0–100.0)
Platelets: 321 K/uL (ref 150–400)
RBC: 4.54 MIL/uL (ref 3.87–5.11)
RDW: 15.8 % — ABNORMAL HIGH (ref 11.5–15.5)
WBC: 7.1 K/uL (ref 4.0–10.5)
nRBC: 0 % (ref 0.0–0.2)

## 2023-12-01 LAB — URINALYSIS, MICROSCOPIC (REFLEX): RBC / HPF: NONE SEEN RBC/hpf (ref 0–5)

## 2023-12-01 LAB — LIPASE, BLOOD: Lipase: 23 U/L (ref 11–51)

## 2023-12-01 MED ORDER — HEPARIN SOD (PORK) LOCK FLUSH 100 UNIT/ML IV SOLN
500.0000 [IU] | Freq: Once | INTRAVENOUS | Status: AC
  Administered 2023-12-01: 500 [IU]
  Filled 2023-12-01: qty 5

## 2023-12-01 MED ORDER — ONDANSETRON 4 MG PO TBDP: 4.0000 mg | ORAL_TABLET | Freq: Three times a day (TID) | ORAL | 0 refills | Status: AC | PRN

## 2023-12-01 MED ORDER — IOHEXOL 300 MG/ML  SOLN
100.0000 mL | Freq: Once | INTRAMUSCULAR | Status: AC | PRN
  Administered 2023-12-01: 100 mL via INTRAVENOUS

## 2023-12-01 MED ORDER — PANTOPRAZOLE SODIUM 40 MG IV SOLR
40.0000 mg | Freq: Once | INTRAVENOUS | Status: AC
  Administered 2023-12-01: 40 mg via INTRAVENOUS
  Filled 2023-12-01: qty 10

## 2023-12-01 MED ORDER — ONDANSETRON HCL 4 MG/2ML IJ SOLN
4.0000 mg | Freq: Once | INTRAMUSCULAR | Status: AC
  Administered 2023-12-01: 4 mg via INTRAVENOUS
  Filled 2023-12-01: qty 2

## 2023-12-01 MED ORDER — SODIUM CHLORIDE 0.9 % IV BOLUS
1000.0000 mL | Freq: Once | INTRAVENOUS | Status: AC
  Administered 2023-12-01: 1000 mL via INTRAVENOUS

## 2023-12-01 NOTE — ED Triage Notes (Signed)
 Pt c/o middle upper to LUQ abd pain x 3d; +NV; currently receiving chemo for colon CA

## 2023-12-01 NOTE — ED Provider Notes (Signed)
 [place] [facility] AT [facility] Provider Note   CSN: [id number] Arrival date & time: 12/01/23  1415     Patient presents with: Abdominal Pain   Lauren Mcpherson is a 58 y.o. female.   Patient with a complaint of right upper quadrant abdominal pain for 3 days.  Triage wrote left upper quadrant abdominal pain.  Associated with nausea vomiting.  Patient is currently receiving infusion chemo for metastatic colon cancer.  Followed at [facility].  Last seen in the office on July 14.  Had her last infusion on July 31.  She is receiving C5D1 FUDR.  Patient has a Port-A-Cath right anterior chest.  And patient also has a infusion for the liver mets cath in place on the left side of the abdomen.  Patient has not had pain like this before.  She is talking about a little bit of frothing this with some of the nausea and vomiting.  Not vomiting any blood.  No diarrhea.  Past medical history significant for the metastatic cancer she had her gallbladder removed.  She had a right colectomy in 2022 when she was first diagnosed.  Patient is a former smoker quit in 2009.       Prior to Admission medications   Medication Sig Start Date End Date Taking? Authorizing Provider  rivaroxaban  (XARELTO ) 10 MG TABS tablet Take 1 tablet (10 mg total) by mouth daily with supper. 07/05/23   Timmy Maude SAUNDERS, MD    Allergies: Patient has no known allergies.    Review of Systems  Constitutional:  Negative for chills and fever.  HENT:  Negative for ear pain and sore throat.   Eyes:  Negative for pain and visual disturbance.  Respiratory:  Negative for cough and shortness of breath.   Cardiovascular:  Negative for chest pain and palpitations.  Gastrointestinal:  Positive for abdominal pain, nausea and vomiting.  Genitourinary:  Negative for dysuria and hematuria.  Musculoskeletal:  Negative for arthralgias and back pain.  Skin:  Negative for color change and rash.  Neurological:   Negative for seizures and syncope.  All other systems reviewed and are negative.   Updated Vital Signs BP 117/83 (BP Location: Right Arm)   Pulse (!) 134   Temp 98.1 F (36.7 C)   Resp 18   Ht 1.575 m (5' 2)   Wt 124.7 kg   LMP 09/22/2020 (Approximate) Comment: Spouse had a vasectomy  SpO2 98%   BMI 50.30 kg/m   Physical Exam Vitals and nursing note reviewed.  Constitutional:      General: She is not in acute distress.    Appearance: Normal appearance. She is well-developed.  HENT:     Head: Normocephalic and atraumatic.     Mouth/Throat:     Mouth: Mucous membranes are moist.  Eyes:     Extraocular Movements: Extraocular movements intact.     Conjunctiva/sclera: Conjunctivae normal.     Pupils: Pupils are equal, round, and reactive to light.  Cardiovascular:     Rate and Rhythm: Normal rate and regular rhythm.     Heart sounds: No murmur heard. Pulmonary:     Effort: Pulmonary effort is normal. No respiratory distress.     Breath sounds: Normal breath sounds.     Comments: Report right upper quadrant Abdominal:     Palpations: Abdomen is soft.     Tenderness: There is abdominal tenderness.     Comments: Slight tenderness right upper quadrant no guarding.  Musculoskeletal:        General: No swelling.     Cervical back: Neck supple.  Skin:    General: Skin is warm and dry.     Capillary Refill: Capillary refill takes less than 2 seconds.  Neurological:     General: No focal deficit present.     Mental Status: She is alert and oriented to person, place, and time.  Psychiatric:        Mood and Affect: Mood normal.     (all labs ordered are listed, but only abnormal results are displayed) Labs Reviewed  COMPREHENSIVE METABOLIC PANEL WITH GFR - Abnormal; Notable for the following components:      Result Value   Potassium 3.3 (*)    Chloride 96 (*)    Glucose, Bld 124 (*)    Alkaline Phosphatase 169 (*)    Anion gap 16 (*)    All other components within  normal limits  CBC - Abnormal; Notable for the following components:   RDW 15.8 (*)    All other components within normal limits  LIPASE, BLOOD  URINALYSIS, ROUTINE W REFLEX MICROSCOPIC    EKG: EKG Interpretation Date/Time:  Saturday December 01 2023 14:31:44 EDT Ventricular Rate:  128 PR Interval:  140 QRS Duration:  84 QT Interval:  332 QTC Calculation: 485 R Axis:   64  Text Interpretation: Sinus tachycardia Multiform ventricular premature complexes Aberrant complex Minimal ST depression, lateral leads T wave inversion RESOLVED SINCE PREVIOUS Confirmed by Doretha Folks ([id number]) on 12/01/2023 2:58:53 PM  Radiology: No results found.   Procedures   Medications Ordered in the ED - No data to display                                  Medical Decision Making Amount and/or Complexity of Data Reviewed Labs: ordered. Radiology: ordered.  Risk Prescription drug management.   CT scan abdomen pelvis shows no acute findings.  There is disease progression as evidenced by enlarging pulmonary hepatic and gastrohepatic ligament nodal Metastasis.  It was compared from her last scan in May of this year.  A lot of this is known.  Would recommend close follow-up with her cancer doctors at [facility].  CBC white count 7.1 hemoglobin 13.9 platelets 321 very reassuring.  Complete metabolic panel potassium down a little bit at 3.3 LFTs alk phos is up at 169 total bili normal though renal function normal.  Glucose 124.  Lipase normal at 23 which is reassuring.  Urinalysis is hazy few bacteria but squamous epithelial is 11-20.  WBCs 0-5.  Do not think this is consistent with a urinary tract infection.  Will have patient treated with antinausea medicine and close follow-up with her cancer center.  No evidence of any acute abnormalities. Does seem to use progression of disease compared to her CT scan from May of this year.  Again close follow-up with her cancer center is  important.   Final diagnoses:  None    ED Discharge Orders     None          Madylyn Insco, MD 12/01/23 [phone]

## 2023-12-01 NOTE — Discharge Instructions (Signed)
 Take the Zofran  ODT for the nausea vomiting.  CT scan today does show some progression of disease compared to the scan from May of this year.  Recommend close follow-up with your cancer center.  Return for any new or worse symptoms.
